# Patient Record
Sex: Female | Born: 1950
Health system: Southern US, Community
[De-identification: ages and names within clinical notes are randomized; demographics above are authoritative.]

## PROBLEM LIST (undated history)

## (undated) DIAGNOSIS — I82401 Acute embolism and thrombosis of unspecified deep veins of right lower extremity: Secondary | ICD-10-CM

## (undated) DIAGNOSIS — R0602 Shortness of breath: Secondary | ICD-10-CM

## (undated) DIAGNOSIS — S72009A Fracture of unspecified part of neck of unspecified femur, initial encounter for closed fracture: Secondary | ICD-10-CM

## (undated) DIAGNOSIS — I509 Heart failure, unspecified: Secondary | ICD-10-CM

## (undated) DIAGNOSIS — E7800 Pure hypercholesterolemia, unspecified: Secondary | ICD-10-CM

## (undated) DIAGNOSIS — I10 Essential (primary) hypertension: Secondary | ICD-10-CM

## (undated) DIAGNOSIS — A419 Sepsis, unspecified organism: Secondary | ICD-10-CM

## (undated) DIAGNOSIS — D649 Anemia, unspecified: Secondary | ICD-10-CM

## (undated) DIAGNOSIS — I639 Cerebral infarction, unspecified: Secondary | ICD-10-CM

## (undated) DIAGNOSIS — IMO0001 Reserved for inherently not codable concepts without codable children: Secondary | ICD-10-CM

## (undated) DIAGNOSIS — Z5189 Encounter for other specified aftercare: Secondary | ICD-10-CM

## (undated) DIAGNOSIS — R6521 Severe sepsis with septic shock: Secondary | ICD-10-CM

## (undated) DIAGNOSIS — J449 Chronic obstructive pulmonary disease, unspecified: Secondary | ICD-10-CM

## (undated) DIAGNOSIS — K819 Cholecystitis, unspecified: Secondary | ICD-10-CM

## (undated) DIAGNOSIS — N179 Acute kidney failure, unspecified: Secondary | ICD-10-CM

## (undated) DIAGNOSIS — K851 Biliary acute pancreatitis without necrosis or infection: Secondary | ICD-10-CM

## (undated) DIAGNOSIS — Z9889 Other specified postprocedural states: Secondary | ICD-10-CM

## (undated) DIAGNOSIS — C801 Malignant (primary) neoplasm, unspecified: Secondary | ICD-10-CM

## (undated) HISTORY — PX: ABDOMINAL HYSTERECTOMY: SHX81

## (undated) HISTORY — DX: Severe sepsis with septic shock: R65.21

## (undated) HISTORY — DX: Sepsis, unspecified organism: A41.9

## (undated) HISTORY — PX: HIP FRACTURE SURGERY: SHX118

## (undated) HISTORY — DX: Cerebral infarction, unspecified: I63.9

## (undated) HISTORY — DX: Cholecystitis, unspecified: K81.9

## (undated) HISTORY — DX: Acute embolism and thrombosis of unspecified deep veins of right lower extremity: I82.401

## (undated) HISTORY — PX: ERCP: SHX60

## (undated) HISTORY — DX: Acute kidney failure, unspecified: N17.9

## (undated) HISTORY — DX: Biliary acute pancreatitis without necrosis or infection: K85.10

---

## 1997-08-01 ENCOUNTER — Other Ambulatory Visit: Admission: RE | Admit: 1997-08-01 | Discharge: 1997-08-01 | Payer: Self-pay | Admitting: Family Medicine

## 1997-09-19 ENCOUNTER — Emergency Department (HOSPITAL_COMMUNITY): Admission: EM | Admit: 1997-09-19 | Discharge: 1997-09-19 | Payer: Self-pay | Admitting: Family Medicine

## 1997-11-14 ENCOUNTER — Encounter: Payer: Self-pay | Admitting: Family Medicine

## 1997-11-14 ENCOUNTER — Ambulatory Visit: Admission: RE | Admit: 1997-11-14 | Discharge: 1997-11-14 | Payer: Self-pay | Admitting: Family Medicine

## 1998-09-04 ENCOUNTER — Ambulatory Visit (HOSPITAL_COMMUNITY): Admission: RE | Admit: 1998-09-04 | Discharge: 1998-09-04 | Payer: Self-pay | Admitting: Family Medicine

## 1998-09-04 ENCOUNTER — Encounter: Payer: Self-pay | Admitting: Family Medicine

## 1998-10-23 ENCOUNTER — Encounter: Payer: Self-pay | Admitting: Family Medicine

## 1998-10-23 ENCOUNTER — Ambulatory Visit (HOSPITAL_COMMUNITY): Admission: RE | Admit: 1998-10-23 | Discharge: 1998-10-23 | Payer: Self-pay | Admitting: Family Medicine

## 1998-12-04 ENCOUNTER — Other Ambulatory Visit: Admission: RE | Admit: 1998-12-04 | Discharge: 1998-12-04 | Payer: Self-pay | Admitting: Obstetrics

## 1999-03-16 ENCOUNTER — Emergency Department (HOSPITAL_COMMUNITY): Admission: EM | Admit: 1999-03-16 | Discharge: 1999-03-16 | Payer: Self-pay

## 2000-07-05 ENCOUNTER — Emergency Department (HOSPITAL_COMMUNITY): Admission: EM | Admit: 2000-07-05 | Discharge: 2000-07-05 | Payer: Self-pay | Admitting: *Deleted

## 2000-08-14 ENCOUNTER — Encounter: Payer: Self-pay | Admitting: Family Medicine

## 2000-08-14 ENCOUNTER — Ambulatory Visit (HOSPITAL_COMMUNITY): Admission: RE | Admit: 2000-08-14 | Discharge: 2000-08-14 | Payer: Self-pay | Admitting: Family Medicine

## 2000-08-24 ENCOUNTER — Encounter: Payer: Self-pay | Admitting: Family Medicine

## 2000-08-24 ENCOUNTER — Ambulatory Visit (HOSPITAL_COMMUNITY): Admission: RE | Admit: 2000-08-24 | Discharge: 2000-08-24 | Payer: Self-pay | Admitting: Family Medicine

## 2001-03-04 ENCOUNTER — Encounter: Payer: Self-pay | Admitting: *Deleted

## 2001-03-04 ENCOUNTER — Ambulatory Visit (HOSPITAL_COMMUNITY): Admission: RE | Admit: 2001-03-04 | Discharge: 2001-03-04 | Payer: Self-pay | Admitting: *Deleted

## 2002-01-09 ENCOUNTER — Emergency Department (HOSPITAL_COMMUNITY): Admission: EM | Admit: 2002-01-09 | Discharge: 2002-01-09 | Payer: Self-pay | Admitting: Emergency Medicine

## 2004-08-13 ENCOUNTER — Emergency Department (HOSPITAL_COMMUNITY): Admission: EM | Admit: 2004-08-13 | Discharge: 2004-08-13 | Payer: Self-pay | Admitting: Emergency Medicine

## 2006-12-15 DIAGNOSIS — E7800 Pure hypercholesterolemia, unspecified: Secondary | ICD-10-CM

## 2006-12-15 DIAGNOSIS — I10 Essential (primary) hypertension: Secondary | ICD-10-CM

## 2006-12-30 ENCOUNTER — Ambulatory Visit: Payer: Self-pay | Admitting: Family Medicine

## 2006-12-30 DIAGNOSIS — J209 Acute bronchitis, unspecified: Secondary | ICD-10-CM

## 2006-12-30 LAB — CONVERTED CEMR LAB
Ketones, urine, test strip: NEGATIVE
Nitrite: NEGATIVE
Urobilinogen, UA: NEGATIVE
pH: 6

## 2006-12-31 ENCOUNTER — Ambulatory Visit: Payer: Self-pay | Admitting: *Deleted

## 2006-12-31 LAB — CONVERTED CEMR LAB
ALT: 16 units/L (ref 0–35)
AST: 24 units/L (ref 0–37)
Albumin: 4.8 g/dL (ref 3.5–5.2)
Alkaline Phosphatase: 101 units/L (ref 39–117)
BUN: 14 mg/dL (ref 6–23)
Basophils Relative: 0 % (ref 0–1)
Calcium: 9.8 mg/dL (ref 8.4–10.5)
Chloride: 104 meq/L (ref 96–112)
Cholesterol: 248 mg/dL — ABNORMAL HIGH (ref 0–200)
Creatinine, Ser: 0.83 mg/dL (ref 0.40–1.20)
HCT: 41.6 % (ref 36.0–46.0)
LDL Cholesterol: 163 mg/dL — ABNORMAL HIGH (ref 0–99)
Lymphocytes Relative: 43 % (ref 12–46)
MCHC: 32 g/dL (ref 30.0–36.0)
Monocytes Relative: 8 % (ref 3–11)
Neutro Abs: 3.6 10*3/uL (ref 1.7–7.7)
Neutrophils Relative %: 48 % (ref 43–77)
Platelets: 202 10*3/uL (ref 150–400)
RBC: 4.36 M/uL (ref 3.87–5.11)
TSH: 1.964 microintl units/mL (ref 0.350–5.50)
Total CHOL/HDL Ratio: 5.4
WBC: 7.6 10*3/uL (ref 4.0–10.5)

## 2007-01-06 ENCOUNTER — Encounter (INDEPENDENT_AMBULATORY_CARE_PROVIDER_SITE_OTHER): Payer: Self-pay | Admitting: *Deleted

## 2007-01-29 ENCOUNTER — Ambulatory Visit: Payer: Self-pay | Admitting: Family Medicine

## 2007-10-25 ENCOUNTER — Ambulatory Visit: Payer: Self-pay | Admitting: Family Medicine

## 2007-10-25 DIAGNOSIS — G608 Other hereditary and idiopathic neuropathies: Secondary | ICD-10-CM

## 2007-10-25 DIAGNOSIS — G609 Hereditary and idiopathic neuropathy, unspecified: Secondary | ICD-10-CM | POA: Insufficient documentation

## 2007-10-26 ENCOUNTER — Encounter (INDEPENDENT_AMBULATORY_CARE_PROVIDER_SITE_OTHER): Payer: Self-pay | Admitting: Family Medicine

## 2007-11-09 LAB — CONVERTED CEMR LAB
AST: 18 units/L (ref 0–37)
Albumin: 4.7 g/dL (ref 3.5–5.2)
Basophils Relative: 0 % (ref 0–1)
Chloride: 104 meq/L (ref 96–112)
Glucose, Bld: 78 mg/dL (ref 70–99)
HCT: 43.7 % (ref 36.0–46.0)
LDL Cholesterol: 195 mg/dL — ABNORMAL HIGH (ref 0–99)
Lead-Whole Blood: 7.4 ug/dL (ref <10)
Lymphocytes Relative: 43 % (ref 12–46)
MCHC: 31.4 g/dL (ref 30.0–36.0)
MCV: 96 fL (ref 78.0–100.0)
Neutrophils Relative %: 49 % (ref 43–77)
Platelets: 227 10*3/uL (ref 150–400)
Potassium: 4.2 meq/L (ref 3.5–5.3)
RBC Folate: 524 ng/mL (ref 180–600)
Sed Rate: 21 mm/hr (ref 0–22)
TSH: 1.097 microintl units/mL (ref 0.350–4.50)
Total Bilirubin: 0.4 mg/dL (ref 0.3–1.2)
Triglycerides: 190 mg/dL — ABNORMAL HIGH (ref <150)
WBC: 8.5 10*3/uL (ref 4.0–10.5)

## 2007-11-12 ENCOUNTER — Telehealth (INDEPENDENT_AMBULATORY_CARE_PROVIDER_SITE_OTHER): Payer: Self-pay | Admitting: *Deleted

## 2007-11-14 ENCOUNTER — Telehealth (INDEPENDENT_AMBULATORY_CARE_PROVIDER_SITE_OTHER): Payer: Self-pay | Admitting: Family Medicine

## 2007-12-28 ENCOUNTER — Ambulatory Visit: Payer: Self-pay | Admitting: Family Medicine

## 2010-03-17 ENCOUNTER — Encounter: Payer: Self-pay | Admitting: Family Medicine

## 2011-03-01 ENCOUNTER — Emergency Department (HOSPITAL_COMMUNITY): Payer: Medicare Other

## 2011-03-01 ENCOUNTER — Other Ambulatory Visit: Payer: Self-pay

## 2011-03-01 ENCOUNTER — Inpatient Hospital Stay (HOSPITAL_COMMUNITY)
Admission: EM | Admit: 2011-03-01 | Discharge: 2011-03-07 | DRG: 065 | Disposition: A | Discharge status: Rehabilitation Facility | Payer: Medicare Other | Attending: Internal Medicine | Admitting: Internal Medicine

## 2011-03-01 DIAGNOSIS — G609 Hereditary and idiopathic neuropathy, unspecified: Secondary | ICD-10-CM | POA: Diagnosis present

## 2011-03-01 DIAGNOSIS — E7800 Pure hypercholesterolemia, unspecified: Secondary | ICD-10-CM | POA: Insufficient documentation

## 2011-03-01 DIAGNOSIS — R270 Ataxia, unspecified: Secondary | ICD-10-CM

## 2011-03-01 DIAGNOSIS — E785 Hyperlipidemia, unspecified: Secondary | ICD-10-CM | POA: Diagnosis present

## 2011-03-01 DIAGNOSIS — I160 Hypertensive urgency: Secondary | ICD-10-CM | POA: Diagnosis present

## 2011-03-01 DIAGNOSIS — R279 Unspecified lack of coordination: Secondary | ICD-10-CM | POA: Diagnosis present

## 2011-03-01 DIAGNOSIS — F17200 Nicotine dependence, unspecified, uncomplicated: Secondary | ICD-10-CM | POA: Diagnosis present

## 2011-03-01 DIAGNOSIS — I69993 Ataxia following unspecified cerebrovascular disease: Secondary | ICD-10-CM

## 2011-03-01 DIAGNOSIS — Z720 Tobacco use: Secondary | ICD-10-CM | POA: Diagnosis present

## 2011-03-01 DIAGNOSIS — I519 Heart disease, unspecified: Secondary | ICD-10-CM | POA: Diagnosis present

## 2011-03-01 DIAGNOSIS — K219 Gastro-esophageal reflux disease without esophagitis: Secondary | ICD-10-CM | POA: Diagnosis present

## 2011-03-01 DIAGNOSIS — I69393 Ataxia following cerebral infarction: Secondary | ICD-10-CM

## 2011-03-01 DIAGNOSIS — N183 Chronic kidney disease, stage 3 unspecified: Secondary | ICD-10-CM | POA: Diagnosis present

## 2011-03-01 DIAGNOSIS — J209 Acute bronchitis, unspecified: Secondary | ICD-10-CM | POA: Diagnosis present

## 2011-03-01 DIAGNOSIS — R05 Cough: Secondary | ICD-10-CM | POA: Diagnosis present

## 2011-03-01 DIAGNOSIS — I129 Hypertensive chronic kidney disease with stage 1 through stage 4 chronic kidney disease, or unspecified chronic kidney disease: Secondary | ICD-10-CM | POA: Diagnosis present

## 2011-03-01 DIAGNOSIS — I6350 Cerebral infarction due to unspecified occlusion or stenosis of unspecified cerebral artery: Principal | ICD-10-CM | POA: Diagnosis present

## 2011-03-01 DIAGNOSIS — I639 Cerebral infarction, unspecified: Secondary | ICD-10-CM | POA: Diagnosis present

## 2011-03-01 DIAGNOSIS — I5189 Other ill-defined heart diseases: Secondary | ICD-10-CM | POA: Diagnosis present

## 2011-03-01 DIAGNOSIS — K5900 Constipation, unspecified: Secondary | ICD-10-CM | POA: Diagnosis present

## 2011-03-01 DIAGNOSIS — I10 Essential (primary) hypertension: Secondary | ICD-10-CM

## 2011-03-01 DIAGNOSIS — R059 Cough, unspecified: Secondary | ICD-10-CM | POA: Diagnosis present

## 2011-03-01 HISTORY — DX: Encounter for other specified aftercare: Z51.89

## 2011-03-01 HISTORY — DX: Reserved for inherently not codable concepts without codable children: IMO0001

## 2011-03-01 HISTORY — DX: Shortness of breath: R06.02

## 2011-03-01 HISTORY — DX: Pure hypercholesterolemia, unspecified: E78.00

## 2011-03-01 HISTORY — DX: Essential (primary) hypertension: I10

## 2011-03-01 LAB — URINALYSIS, ROUTINE W REFLEX MICROSCOPIC
Bilirubin Urine: NEGATIVE
Glucose, UA: NEGATIVE mg/dL
Ketones, ur: NEGATIVE mg/dL
Specific Gravity, Urine: 1.008 (ref 1.005–1.030)
Urobilinogen, UA: 0.2 mg/dL (ref 0.0–1.0)

## 2011-03-01 LAB — RAPID URINE DRUG SCREEN, HOSP PERFORMED
Amphetamines: NOT DETECTED
Barbiturates: NOT DETECTED
Benzodiazepines: NOT DETECTED
Cocaine: NOT DETECTED
Opiates: NOT DETECTED
Tetrahydrocannabinol: NOT DETECTED

## 2011-03-01 LAB — DIFFERENTIAL
Basophils Absolute: 0 10*3/uL (ref 0.0–0.1)
Basophils Relative: 0 % (ref 0–1)
Monocytes Relative: 9 % (ref 3–12)
Neutrophils Relative %: 53 % (ref 43–77)

## 2011-03-01 LAB — URINE MICROSCOPIC-ADD ON

## 2011-03-01 LAB — POCT I-STAT, CHEM 8
Glucose, Bld: 87 mg/dL (ref 70–99)
HCT: 46 % (ref 36.0–46.0)
Hemoglobin: 15.6 g/dL — ABNORMAL HIGH (ref 12.0–15.0)
Potassium: 4.6 mEq/L (ref 3.5–5.1)

## 2011-03-01 LAB — CBC
Hemoglobin: 14.4 g/dL (ref 12.0–15.0)
MCHC: 32.7 g/dL (ref 30.0–36.0)
MCV: 91.9 fL (ref 78.0–100.0)
RBC: 4.79 MIL/uL (ref 3.87–5.11)
RDW: 12.7 % (ref 11.5–15.5)
WBC: 10.3 10*3/uL (ref 4.0–10.5)

## 2011-03-01 MED ORDER — ONDANSETRON HCL 4 MG/2ML IJ SOLN: 4.0000 mg | Freq: Four times a day (QID) | INTRAMUSCULAR | Status: DC | PRN

## 2011-03-01 MED ORDER — ENOXAPARIN SODIUM 40 MG/0.4ML ~~LOC~~ SOLN
40.0000 mg | SUBCUTANEOUS | Status: DC
  Administered 2011-03-01 – 2011-03-06 (×6): 40 mg via SUBCUTANEOUS
  Filled 2011-03-01 (×7): qty 0.4

## 2011-03-01 MED ORDER — VITAMINS A & D EX OINT
TOPICAL_OINTMENT | CUTANEOUS | Status: AC
  Administered 2011-03-01: 23:00:00
  Filled 2011-03-01: qty 5

## 2011-03-01 MED ORDER — ENOXAPARIN SODIUM 40 MG/0.4ML ~~LOC~~ SOLN
40.0000 mg | SUBCUTANEOUS | Status: DC
  Filled 2011-03-01: qty 0.4

## 2011-03-01 MED ORDER — SODIUM CHLORIDE 0.9 % IV SOLN
Freq: Once | INTRAVENOUS | Status: AC
  Administered 2011-03-01: 22:00:00 via INTRAVENOUS

## 2011-03-01 MED ORDER — NICOTINE 21 MG/24HR TD PT24
21.0000 mg | MEDICATED_PATCH | Freq: Every day | TRANSDERMAL | Status: DC
  Filled 2011-03-01 (×7): qty 1

## 2011-03-01 MED ORDER — ASPIRIN 325 MG PO TABS
325.0000 mg | ORAL_TABLET | Freq: Every day | ORAL | Status: DC
  Administered 2011-03-02 – 2011-03-07 (×6): 325 mg via ORAL
  Filled 2011-03-01 (×6): qty 1

## 2011-03-01 MED ORDER — IPRATROPIUM BROMIDE 0.02 % IN SOLN
0.5000 mg | Freq: Four times a day (QID) | RESPIRATORY_TRACT | Status: DC
  Administered 2011-03-01: 0.5 mg via RESPIRATORY_TRACT

## 2011-03-01 MED ORDER — ACETAMINOPHEN 650 MG RE SUPP: 650.0000 mg | Freq: Four times a day (QID) | RECTAL | Status: DC | PRN

## 2011-03-01 MED ORDER — HYDRALAZINE HCL 20 MG/ML IJ SOLN
10.0000 mg | INTRAMUSCULAR | Status: DC | PRN
Start: 2011-03-01 — End: 2011-03-07
  Administered 2011-03-01: 19:00:00 via INTRAVENOUS
  Administered 2011-03-02 – 2011-03-04 (×2): 10 mg via INTRAVENOUS
  Filled 2011-03-01: qty 1
  Filled 2011-03-01: qty 0.5
  Filled 2011-03-01: qty 1
  Filled 2011-03-01: qty 0.5
  Filled 2011-03-01 (×2): qty 1

## 2011-03-01 MED ORDER — LABETALOL HCL 5 MG/ML IV SOLN
20.0000 mg | Freq: Once | INTRAVENOUS | Status: DC
  Filled 2011-03-01: qty 4

## 2011-03-01 MED ORDER — CLONIDINE HCL 0.1 MG PO TABS
0.1000 mg | ORAL_TABLET | Freq: Once | ORAL | Status: AC
  Administered 2011-03-01: 0.1 mg via ORAL
  Filled 2011-03-01: qty 1

## 2011-03-01 MED ORDER — ALBUTEROL SULFATE (5 MG/ML) 0.5% IN NEBU
2.5000 mg | INHALATION_SOLUTION | Freq: Four times a day (QID) | RESPIRATORY_TRACT | Status: DC
  Administered 2011-03-01: 2.5 mg via RESPIRATORY_TRACT

## 2011-03-01 MED ORDER — ASPIRIN 81 MG PO CHEW
324.0000 mg | CHEWABLE_TABLET | Freq: Once | ORAL | Status: AC
  Administered 2011-03-01: 324 mg via ORAL
  Filled 2011-03-01: qty 3
  Filled 2011-03-01: qty 1

## 2011-03-01 MED ORDER — ADULT MULTIVITAMIN W/MINERALS CH
1.0000 | ORAL_TABLET | Freq: Every day | ORAL | Status: DC
  Administered 2011-03-02 – 2011-03-07 (×6): 1 via ORAL
  Filled 2011-03-01 (×6): qty 1

## 2011-03-01 MED ORDER — LABETALOL HCL 5 MG/ML IV SOLN
10.0000 mg | Freq: Once | INTRAVENOUS | Status: AC
  Administered 2011-03-01: 10 mg via INTRAVENOUS
  Filled 2011-03-01 (×2): qty 4

## 2011-03-01 MED ORDER — ACETAMINOPHEN 325 MG PO TABS: 650.0000 mg | ORAL_TABLET | Freq: Four times a day (QID) | ORAL | Status: DC | PRN

## 2011-03-01 MED ORDER — TRAZODONE HCL 50 MG PO TABS
50.0000 mg | ORAL_TABLET | Freq: Every evening | ORAL | Status: DC | PRN
  Administered 2011-03-02 (×2): 50 mg via ORAL
  Filled 2011-03-01 (×2): qty 1

## 2011-03-01 MED ORDER — SENNOSIDES-DOCUSATE SODIUM 8.6-50 MG PO TABS
1.0000 | ORAL_TABLET | Freq: Every evening | ORAL | Status: DC | PRN
  Filled 2011-03-01: qty 1

## 2011-03-01 MED ORDER — IPRATROPIUM BROMIDE 0.02 % IN SOLN
RESPIRATORY_TRACT | Status: AC
  Filled 2011-03-01: qty 2.5

## 2011-03-01 MED ORDER — ASPIRIN 300 MG RE SUPP
300.0000 mg | Freq: Every day | RECTAL | Status: DC
  Filled 2011-03-01 (×6): qty 1

## 2011-03-01 MED ORDER — ALBUTEROL SULFATE (5 MG/ML) 0.5% IN NEBU
INHALATION_SOLUTION | RESPIRATORY_TRACT | Status: AC
  Filled 2011-03-01: qty 0.5

## 2011-03-01 NOTE — ED Provider Notes (Signed)
History     CSN: [id number]  Arrival date & time 03/01/11  1027   First MD Initiated Contact with Patient 03/01/11 1204      Chief Complaint  Patient presents with  . Extremity Weakness    (Consider location/radiation/quality/duration/timing/severity/associated sxs/prior treatment) Patient is a 61 y.o. female presenting with extremity weakness. The history is provided by the patient and a relative.  Extremity Weakness This is a new problem. The current episode started yesterday. The problem occurs constantly. The problem has been unchanged. Pertinent negatives include no chest pain, chills, fever or headaches. Associated symptoms comments: She states she took a nap yesterday afternoon and when she woke, she reports that she felt weak in the right leg and off balance when she walks. She denies dizziness, visual changes, nausea. Symptoms have been constant. No other extremity weakness, no numbness. She reports that she was treated previously with blood pressure medication but it was stopped by her doctor 3 years ago. She denies having had it checked since that time. . The symptoms are aggravated by nothing. She has tried nothing for the symptoms.    Past Medical History  Diagnosis Date  . Hypertension   . Hypercholesteremia     History reviewed. No pertinent past surgical history.  History reviewed. No pertinent family history.  History  Substance Use Topics  . Smoking status: Never Smoker   . Smokeless tobacco: Not on file  . Alcohol Use: No    OB History    Grav Para Term Preterm Abortions TAB SAB Ect Mult Living                  Review of Systems  Constitutional: Negative for fever and chills.  HENT: Negative.   Eyes: Negative for visual disturbance.  Respiratory: Negative.  Negative for shortness of breath.   Cardiovascular: Negative.  Negative for chest pain and leg swelling.  Gastrointestinal: Negative.   Musculoskeletal: Positive for extremity weakness.  Skin:  Negative.   Neurological: Negative for headaches.       See HPI. C/o ataxia.    Allergies  Review of patient's allergies indicates no known allergies.  Home Medications  No current outpatient prescriptions on file.  BP 212/128  Pulse 96  Temp(Src) 98.3 F (36.8 C) (Oral)  Resp 15  SpO2 97%  Physical Exam  Constitutional: She is oriented to person, place, and time. She appears well-developed and well-nourished.  HENT:  Head: Normocephalic.  Eyes: Pupils are equal, round, and reactive to light.  Neck: Normal range of motion. Neck supple.  Cardiovascular: Normal rate and regular rhythm.   Pulmonary/Chest: Effort normal and breath sounds normal.  Abdominal: Soft. Bowel sounds are normal. There is no tenderness. There is no rebound and no guarding.  Musculoskeletal: Normal range of motion.  Neurological: She is alert and oriented to person, place, and time. She has normal strength and normal reflexes. She displays normal reflexes. No cranial nerve deficit or sensory deficit. Gait abnormal. GCS eye subscore is 4. GCS verbal subscore is 5. GCS motor subscore is 6.       Patient has ataxic gait, stumbling backwards and to right. No foot drop.   Skin: Skin is warm and dry. No rash noted.  Psychiatric: She has a normal mood and affect.    ED Course  Procedures (including critical care time)   Labs Reviewed  CBC  DIFFERENTIAL  I-STAT, CHEM 8  URINALYSIS, ROUTINE W REFLEX MICROSCOPIC  TROPONIN I   No results found.  No diagnosis found.    MDM  [facility] to admit.        Rodena Medin, PA 03/02/11 1032

## 2011-03-01 NOTE — H&P (Signed)
Hospital Admission Note Date: 03/01/2011  Patient name: Katelyn [name] Medical record number: [id number] Date of birth: 04/05/1950 Age: 61 y.o. Gender: female PCP: Beverley Fiedler, MD  Chief Complaint: unsteady   History of Present Illness: This is a 61 year old woman who noticed the sudden onset right leg weakness last night while she was out on the porch smoking.  She denies any headache any paresthesias in the arm involvement any slurred speech or chest pain.  She thought it would be better this morning when she woke up, but it was not.  Her daughter and her to come to the emergency department when she found out about the symptoms this morning. The patient has had a dry cough for the last 2-3 months. Other than that she's been in her usual state of health.  Meds: Stopped blood pressure and cholesterol medication 3 years ago.  Possibly Crestor and HCTZ  Allergies: Review of patient's allergies indicates no known allergies. Past Medical History  Diagnosis Date  . Hypertension   . Hypercholesteremia    Past Surgical History  Procedure Date  . Abdominal hysterectomy    History reviewed. No pertinent family history. History   Social History  . Marital Status: Widowed    Spouse Name: N/A    Number of Children: N/A  . Years of Education: N/A   Occupational History  . Not on file.   Social History Main Topics  . Smoking status: Current Everyday Smoker    Types: Cigarettes  . Smokeless tobacco: Not on file  . Alcohol Use: No  . Drug Use: No  . Sexually Active:    Other Topics Concern  . Not on file   Social History Narrative  . No narrative on file    Review of Systems: Review of Systems  Respiratory: Positive for cough.   All other systems reviewed and are negative.     Filed Vitals:   03/01/11 1333 03/01/11 1436 03/01/11 1606 03/01/11 1607  BP: 218/116 176/104 195/106 195/106  Pulse:  76  97  Temp:      TempSrc:      Resp:  16  18  SpO2:  100%  97%     Physical Exam: Physical Exam  Constitutional: She is oriented to person, place, and time. She appears well-developed and well-nourished. She appears distressed.  HENT:  Head: Normocephalic and atraumatic.  Nose: Nose normal.  Mouth/Throat: No oropharyngeal exudate.  Eyes: Conjunctivae and EOM are normal. Pupils are equal, round, and reactive to light. Right eye exhibits no discharge. Left eye exhibits no discharge. No scleral icterus.  Neck: Normal range of motion. Neck supple. No JVD present. No tracheal deviation present. No thyromegaly present.  Cardiovascular: Normal rate, regular rhythm, normal heart sounds and intact distal pulses.  Exam reveals no gallop and no friction rub.   No murmur heard. Pulmonary/Chest: Effort normal. No stridor. No respiratory distress. She has wheezes. She has no rales. She exhibits no tenderness.  Abdominal: She exhibits no distension and no mass. There is no tenderness. There is no rebound and no guarding.  Musculoskeletal: Normal range of motion. She exhibits no edema and no tenderness.  Lymphadenopathy:    She has no cervical adenopathy.  Neurological: She is alert and oriented to person, place, and time. She displays normal reflexes. No cranial nerve deficit. She exhibits normal muscle tone. Coordination abnormal.  Skin: Skin is warm and dry. She is not diaphoretic.  Psychiatric: She has a normal mood and affect.  Lab results: Results for orders placed during the hospital encounter of 03/01/11  CBC      Component Value Range   WBC 10.3  4.0 - 10.5 (K/uL)   RBC 4.79  3.87 - 5.11 (MIL/uL)   Hemoglobin 14.4  12.0 - 15.0 (g/dL)   HCT 40.9  81.1 - 91.4 (%)   MCV 91.9  78.0 - 100.0 (fL)   MCH 30.1  26.0 - 34.0 (pg)   MCHC 32.7  30.0 - 36.0 (g/dL)   RDW 78.2  95.6 - 21.3 (%)   Platelets 176  150 - 400 (K/uL)  DIFFERENTIAL      Component Value Range   Neutrophils Relative 53  43 - 77 (%)   Neutro Abs 5.5  1.7 - 7.7 (K/uL)   Lymphocytes  Relative 37  12 - 46 (%)   Lymphs Abs 3.8  0.7 - 4.0 (K/uL)   Monocytes Relative 9  3 - 12 (%)   Monocytes Absolute 0.9  0.1 - 1.0 (K/uL)   Eosinophils Relative 1  0 - 5 (%)   Eosinophils Absolute 0.1  0.0 - 0.7 (K/uL)   Basophils Relative 0  0 - 1 (%)   Basophils Absolute 0.0  0.0 - 0.1 (K/uL)  URINALYSIS, ROUTINE W REFLEX MICROSCOPIC      Component Value Range   Color, Urine YELLOW  YELLOW    APPearance CLOUDY (*) CLEAR    Specific Gravity, Urine 1.008  1.005 - 1.030    pH 6.0  5.0 - 8.0    Glucose, UA NEGATIVE  NEGATIVE (mg/dL)   Hgb urine dipstick TRACE (*) NEGATIVE    Bilirubin Urine NEGATIVE  NEGATIVE    Ketones, ur NEGATIVE  NEGATIVE (mg/dL)   Protein, ur NEGATIVE  NEGATIVE (mg/dL)   Urobilinogen, UA 0.2  0.0 - 1.0 (mg/dL)   Nitrite NEGATIVE  NEGATIVE    Leukocytes, UA MODERATE (*) NEGATIVE   TROPONIN I      Component Value Range   Troponin I <0.30  <0.30 (ng/mL)  POCT I-STAT, CHEM 8      Component Value Range   Sodium 144  135 - 145 (mEq/L)   Potassium 4.6  3.5 - 5.1 (mEq/L)   Chloride 109  96 - 112 (mEq/L)   BUN 13  6 - 23 (mg/dL)   Creatinine, Ser 0.86  0.50 - 1.10 (mg/dL)   Glucose, Bld 87  70 - 99 (mg/dL)   Calcium, Ion 5.78  4.69 - 1.32 (mmol/L)   TCO2 26  0 - 100 (mmol/L)   Hemoglobin 15.6 (*) 12.0 - 15.0 (g/dL)   HCT 62.9  52.8 - 41.3 (%)  URINE MICROSCOPIC-ADD ON      Component Value Range   Squamous Epithelial / LPF FEW (*) RARE    WBC, UA 7-10  <3 (WBC/hpf)   RBC / HPF 3-6  <3 (RBC/hpf)   Urine-Other TRICHOMONAS PRESENT     Imaging results:  Ct Head Wo Contrast  03/01/2011  *RADIOLOGY REPORT*  Clinical Data: Right lower extremity pain/weakness  CT HEAD WITHOUT CONTRAST  Technique:  Contiguous axial images were obtained from the base of the skull through the vertex without contrast.  Comparison: None.  Findings: No evidence of parenchymal hemorrhage or extra-axial fluid collection. No mass lesion, mass effect, or midline shift.  No CT evidence of acute  infarction.  Subcortical white matter and periventricular small vessel ischemic changes.  Intracranial atherosclerosis.  Cerebral volume is age appropriate.  No ventriculomegaly.  The visualized  paranasal sinuses are essentially clear. The mastoid air cells are unopacified.  No evidence of calvarial fracture.  IMPRESSION: No evidence of acute intracranial abnormality.  Small vessel ischemic changes with intracranial atherosclerosis.  Original Report Authenticated By: Charline Bills, M.D.   Dg Chest Portable 1 View  03/01/2011  *RADIOLOGY REPORT*  Clinical Data: Hypertension  PORTABLE CHEST - 1 VIEW  Comparison: None.  Findings: Lungs are clear. No pleural effusion or pneumothorax.  Cardiomediastinal silhouette is within normal limits.  IMPRESSION: No evidence of acute cardiopulmonary disease.  Original Report Authenticated By: Charline Bills, M.D.    Assessment & Plan by Problem: #1. Ataxia.  Despite a normal head CT I am still concerned for up acute stroke. I will put her on a daily aspirin order PT OT and an MRI of her brain. Will put her on a stroke protocol. His consider neurology consultation because without stroke she has significant acute right leg weakness.  A urine drug screen and TSH have been ordered.  #2. Malignant hypertension. Because of the possibility of stroke we will not control her blood pressure aggressively. I started her on ACE inhibitor and Norvasc. And follow  #3.  Chronic nonproductive cough.  Will check BNP r/o heart failure.  CXR is normal.  Add Nebs.  Follow   Patient Active Problem List  Diagnoses  . HYPERLIPIDEMIA  . NEUROPATHY, IDIOPATHIC  . HYPERTENSION  . ACUTE BRONCHITIS  . Malignant hypertensive urgency  . CVA (cerebral infarction)     Greater than 70 minutes was spent on direct patient care and coordination of care.  Morse Brueggemann 03/01/2011, 5:28 PM

## 2011-03-01 NOTE — ED Notes (Signed)
Pt states she has not been on blood pressure medication x 2 years. States yesterday she woke up from a nap yesterday afternoon and her right foot was numb and tingly. Pt also states she has been "off balance". Reports non-productive cough x 2 months. ":

## 2011-03-01 NOTE — ED Notes (Signed)
Report given Sherrilee Gilles, RN.

## 2011-03-01 NOTE — ED Notes (Signed)
Pt in with c/o right lower extremity pain and states has been off balance denies recent injury  States has been off htn medications

## 2011-03-02 ENCOUNTER — Inpatient Hospital Stay (HOSPITAL_COMMUNITY): Payer: Medicare Other

## 2011-03-02 LAB — LIPID PANEL
Cholesterol: 259 mg/dL — ABNORMAL HIGH (ref 0–200)
HDL: 32 mg/dL — ABNORMAL LOW (ref >39)
LDL Cholesterol: 196 mg/dL — ABNORMAL HIGH (ref 0–99)
Total CHOL/HDL Ratio: 8.1 RATIO
Triglycerides: 153 mg/dL — ABNORMAL HIGH (ref <150)
VLDL: 31 mg/dL (ref 0–40)

## 2011-03-02 LAB — HEMOGLOBIN A1C
Hgb A1c MFr Bld: 5.6 % (ref <5.7)
Mean Plasma Glucose: 114 mg/dL (ref <117)

## 2011-03-02 MED ORDER — ROSUVASTATIN CALCIUM 10 MG PO TABS
10.0000 mg | ORAL_TABLET | Freq: Every day | ORAL | Status: DC
  Administered 2011-03-02 – 2011-03-05 (×4): 10 mg via ORAL
  Filled 2011-03-02 (×6): qty 1

## 2011-03-02 MED ORDER — AMLODIPINE BESYLATE 5 MG PO TABS
5.0000 mg | ORAL_TABLET | Freq: Every day | ORAL | Status: DC
  Administered 2011-03-02: 5 mg via ORAL
  Filled 2011-03-02 (×3): qty 1

## 2011-03-02 MED ORDER — ALBUTEROL SULFATE (5 MG/ML) 0.5% IN NEBU: 2.5000 mg | INHALATION_SOLUTION | RESPIRATORY_TRACT | Status: DC | PRN

## 2011-03-02 MED ORDER — LORAZEPAM 2 MG/ML IJ SOLN
2.0000 mg | Freq: Once | INTRAMUSCULAR | Status: AC
  Administered 2011-03-02: 2 mg via INTRAVENOUS
  Filled 2011-03-02: qty 1

## 2011-03-02 NOTE — Progress Notes (Signed)
Subjective: Pt states that she has little change in her gait since yesterday.  No new changes in word finding, dysphagia, or confusion.  She is awake, alert and oriented x 3.  Objective: Vital signs in last 24 hours: Temp:  [97.8 F (36.6 C)-98.5 F (36.9 C)] 97.9 F (36.6 C) (01/06 1410) Pulse Rate:  [78-105] 93  (01/06 1410) Resp:  [18] 18  (01/06 1410) BP: (134-199)/(83-124) 150/94 mmHg (01/06 1410) SpO2:  [97 %-100 %] 99 % (01/06 1410) Weight:  [50.6 kg (111 lb 8.8 oz)] 111 lb 8.8 oz (50.6 kg) (01/05 2047) Weight change:  Last BM Date: 03/01/11  Intake/Output from previous day: 01/05 0701 - 01/06 0700 In: -  Out: 550 [Urine:550] Intake/Output this shift:    General appearance: alert, cooperative, appears stated age and no distress Head: Normocephalic, without obvious abnormality, atraumatic Eyes: conjunctivae/corneas clear. PERRL, EOM's intact. Fundi benign. Throat: lips, mucosa, and tongue normal; teeth and gums normal Neck: no adenopathy, no carotid bruit, no JVD, supple, symmetrical, trachea midline and thyroid not enlarged, symmetric, no tenderness/mass/nodules Resp: clear to auscultation bilaterally Cardio: regular rate and rhythm, S1, S2 normal, no murmur, click, rub or gallop and no rub GI: soft, non-tender; bowel sounds normal; no masses,  no organomegaly Extremities: extremities normal, atraumatic, no cyanosis or edema Pulses: 2+ and symmetric Neurologic: Alert and oriented X 3, normal strength and tone. Normal symmetric reflexes. Normal coordination and gait Coordination: finger to nose reduced on the left  Lab Results:  Basename 03/01/11 1318  WBC 10.3  HGB 14.415.6*  HCT 44.046.0  PLT 176   BMET  Basename 03/01/11 1318  NA 144  K 4.6  CL 109  CO2 --  GLUCOSE 87  BUN 13  CREATININE 1.00  CALCIUM --    Studies/Results: Ct Head Wo Contrast  03/01/2011  *RADIOLOGY REPORT*  Clinical Data: Right lower extremity pain/weakness  CT HEAD WITHOUT  CONTRAST  Technique:  Contiguous axial images were obtained from the base of the skull through the vertex without contrast.  Comparison: None.  Findings: No evidence of parenchymal hemorrhage or extra-axial fluid collection. No mass lesion, mass effect, or midline shift.  No CT evidence of acute infarction.  Subcortical white matter and periventricular small vessel ischemic changes.  Intracranial atherosclerosis.  Cerebral volume is age appropriate.  No ventriculomegaly.  The visualized paranasal sinuses are essentially clear. The mastoid air cells are unopacified.  No evidence of calvarial fracture.  IMPRESSION: No evidence of acute intracranial abnormality.  Small vessel ischemic changes with intracranial atherosclerosis.  Original Report Authenticated By: Charline Bills, M.D.   Mr Brain Wo Contrast  03/02/2011  *RADIOLOGY REPORT*  Clinical Data: Rule out TIA.  Right lower extremity pain and weakness.The examination had to be discontinued prior to completion due to claustrophobia.  MRI HEAD WITHOUT CONTRAST  Technique:  Multiplanar, multiecho pulse sequences of the brain and surrounding structures were obtained according to standard protocol without intravenous contrast.  Comparison: CT head without contrast 03/01/2011.  Findings: Diffusion weighted images demonstrate to a focal area of non hemorrhagic infarction measuring 2.5 cm in the left cingulate gyrus, compatible with the patient's right lower extremity symptoms.  T2 and FLAIR changes are evident in this area as well. No other focal acute infarction is evident.  There is no evidence for hemorrhage or mass lesion.  The study is moderately degraded by patient motion.  Coronal T2-weighted images were not obtained.  Mild periventricular and subcortical T2 hyperintensities are advanced for age.  The  ventricles are normal size.  No significant extra-axial fluid collection is present.  Flow is present in the major intracranial arteries.  The globes are grossly  intact.  There is fluid in the right mastoid air cells.  No definite obstructing nasopharyngeal lesion is evident.  IMPRESSION:  1.  Acute non hemorrhagic infarct within the left cingulate gyrus, compatible with right lower extremity symptoms. 2.  Mild periventricular and subcortical white matter changes advanced for age. The finding is nonspecific but can be seen in the setting of chronic microvascular ischemia, a demyelinating process such as multiple sclerosis, vasculitis, complicated migraine headaches, or as the sequelae of a prior infectious or inflammatory process. 3.  The study is moderately degraded by patient motion and had to be discontinued secondary to patient claustrophobia.  These results were called by telephone on 03/02/2011  at  10:55 a.m. to  Dr. Darnelle Catalan, who verbally acknowledged these results.  Original Report Authenticated By: Jamesetta Orleans. MATTERN, M.D.   Dg Chest Portable 1 View  03/01/2011  *RADIOLOGY REPORT*  Clinical Data: Hypertension  PORTABLE CHEST - 1 VIEW  Comparison: None.  Findings: Lungs are clear. No pleural effusion or pneumothorax.  Cardiomediastinal silhouette is within normal limits.  IMPRESSION: No evidence of acute cardiopulmonary disease.  Original Report Authenticated By: Charline Bills, M.D.    Medications: I have reviewed the patient's current medications.  Assessment/Plan: 1. Acute CVA within the cyngulate gyrus appaarent on MRI today.  Neurological status is stable.  Pt is receiving ASA daily.  Will add statin and check echocardiogram and B/L carotid dopplers.  PT/OT eval and treat.  As pt is well outside the acute window for this CVA would treat blood pressures more aggressively.  Consult neurology in the am. 2. Hyperlipidemia - start statin 3. HTN - start amlodipine 5 mg 1 PO daily. 4.Ataxia - PT/OT eval and treat. Consult SW for discharge needs. 5. Chronic cough.  Likely secondary to GERD - start PPI.  LOS: 1 day   Ova Meegan 03/02/2011, 5:28 PM

## 2011-03-02 NOTE — Progress Notes (Signed)
Occupational Therapy Evaluation Patient Details Name: Katelyn [name] MRN: [id number] DOB: January 08, 1951 Today's Date: 03/02/2011  Problem List:  Patient Active Problem List  Diagnoses  . HYPERLIPIDEMIA  . NEUROPATHY, IDIOPATHIC  . HYPERTENSION  . ACUTE BRONCHITIS  . Malignant hypertensive urgency  . CVA (cerebral infarction)    Past Medical History:  Past Medical History  Diagnosis Date  . Hypertension   . Hypercholesteremia    Past Surgical History:  Past Surgical History  Procedure Date  . Abdominal hysterectomy     OT Assessment/Plan/Recommendation OT Assessment Clinical Impression Statement: Pt presents s/p apparent central gyrus CVA per CT scan, resulting in balance deficits in sitting and standing, decreased fine motor control and coordination, decreased integrated use of BUE and apparent cognitive deficits with judgement/insight and ability to self monitor and correct. OT Recommendation/Assessment: Patient will need skilled OT in the acute care venue OT Problem List: Decreased activity tolerance;Impaired balance (sitting and/or standing);Impaired vision/perception;Decreased coordination;Decreased cognition;Decreased safety awareness;Decreased knowledge of use of DME or AE;Decreased knowledge of precautions;Impaired UE functional use Barriers to Discharge: None OT Therapy Diagnosis : Cognitive deficits;Disturbance of vision;Ataxia;balance deficits OT Plan OT Frequency: Min 2X/week OT Treatment/Interventions: Self-care/ADL training;Therapeutic exercise;Neuromuscular education;Energy conservation;DME and/or AE instruction;Therapeutic activities;Cognitive remediation/compensation;Visual/perceptual remediation/compensation;Patient/family education;Balance training OT Recommendation Recommendations for Other Services: Rehab consult;Speech consult Follow Up Recommendations: Inpatient Rehab Equipment Recommended: Defer to next venue Individuals Consulted Consulted and Agree  with Results and Recommendations: Patient;Family member/caregiver Family Member Consulted: daughters OT Goals Acute Rehab OT Goals OT Goal Formulation: With patient Time For Goal Achievement: 2 weeks ADL Goals Pt Will Perform Eating: Independently ADL Goal: Eating - Progress: Not met Pt Will Perform Grooming: with supervision;Standing at sink ADL Goal: Grooming - Progress: Not met Pt Will Perform Upper Body Bathing: with supervision;Sitting in shower ADL Goal: Upper Body Bathing - Progress: Not met Pt Will Perform Lower Body Bathing: with supervision;Sit to stand in shower;with cueing (comment type and amount) ADL Goal: Lower Body Bathing - Progress: Not met Pt Will Perform Lower Body Dressing: with supervision;Sit to stand from chair;with cueing (comment type and amount) ADL Goal: Lower Body Dressing - Progress: Not met Pt Will Transfer to Toilet: with supervision;Grab bars;Ambulation ADL Goal: Toilet Transfer - Progress: Not met Pt Will Perform Toileting - Clothing Manipulation: with supervision ADL Goal: Toileting - Clothing Manipulation - Progress: Not met Pt Will Perform Toileting - Hygiene: Independently ADL Goal: Toileting - Hygiene - Progress: Not met  OT Evaluation Precautions/Restrictions  Precautions Precautions: Fall Restrictions Weight Bearing Restrictions: No Prior Functioning Home Living Lives With: Significant other [name]) Receives Help From: Family Type of Home: House Home Layout: One level Home Access: Stairs to enter Entrance Stairs-Rails: Left Entrance Stairs-Number of Steps: 3-5 Bathroom Shower/Tub: Forensic scientist: Standard Bathroom Accessibility: Yes How Accessible: Accessible via walker Home Adaptive Equipment: None Prior Function Level of Independence: Independent with basic ADLs;Independent with homemaking with ambulation Able to Take Stairs?: Yes Driving: No Vocation: Retired ADL ADL Eating/Feeding: Set  up Eating/Feeding Details (indicate cue type and reason): [name] states that she has been coughing when  eating and drinking, but that this has been going on for weeks. Where Assessed - Eating/Feeding: Chair Grooming: Wash/dry hands;Wash/dry face;Brushing hair Where Assessed - Grooming: Sitting, chair Upper Body Bathing: Minimal assistance Where Assessed - Upper Body Bathing: Supported Lower Body Bathing: Minimal assistance Where Assessed - Lower Body Bathing: Sit to stand from chair;Supported Location manager Dressing: Minimal assistance Where Assessed - Upper Body Dressing: Supported Lower Body Dressing: Minimal  assistance;Performed Where Assessed - Lower Body Dressing: Sit to stand from chair Toilet Transfer: Moderate assistance Toilet Transfer Method: Ambulating;Stand pivot Acupuncturist: Regular height toilet;Grab bars Toileting - Clothing Manipulation: Minimal assistance Where Assessed - Toileting Clothing Manipulation: Standing Toileting - Hygiene: Supervision/safety Where Assessed - Toileting Hygiene: Standing Tub/Shower Transfer: Not assessed ADL Comments: Pt very unsafe with ADL due to poor balance, insight into deficits and judgment and decreased coordination. Vision/Perception  Vision - History Baseline Vision: Wears glasses only for reading Patient Visual Report: Blurring of vision (Need to further assess. Difficulty with tracking and pursuit) Vision - Assessment Eye Alignment: Within Functional Limits Vision Assessment: Vision impaired - to be further tested in functional context Perception Perception: Within Functional Limits Praxis Praxis: Intact Cognition Cognition Arousal/Alertness: Lethargic Overall Cognitive Status: Impaired Attention: Impaired Current Attention Level: Sustained Orientation Level: Oriented X4 Safety/Judgement: Decreased safety judgement for tasks assessed Decreased Safety/Judgement: Impulsive;Decreased awareness of need for  assistance Awareness of Deficits: Decreased awareness of deficits Problem Solving: Requires assistance for problem solving Sensation/Coordination Sensation Light Touch: Appears Intact Stereognosis: Appears Intact Hot/Cold: Appears Intact Proprioception: Appears Intact Coordination Gross Motor Movements are Fluid and Coordinated: No Fine Motor Movements are Fluid and Coordinated: No Finger Nose Finger Test: Ataxic type movements on L Extremity Assessment RUE Assessment RUE Assessment: Exceptions to [facility] (Decreased fine motor control and coordiantion. Difficulty wi) LUE Assessment LUE Assessment: Within Functional Limits Mobility  Bed Mobility Bed Mobility: Yes Transfers Transfers: Yes Sit to Stand: 3: Mod assist Sit to Stand Details (indicate cue type and reason): Pt leaning posteriorly. Unable to stand safely independently Stand to Sit: 4: Min assist   End of Session OT - End of Session Equipment Utilized During Treatment: Gait belt Activity Tolerance: Patient tolerated treatment well Patient left: in chair;with call bell in reach;with family/visitor present (asked CNA to put in chair alarm) Nurse Communication: Other (comment) (chair alarm and poor judgement) General Behavior During Session: Lethargic Cognition: Impaired   Laquesha Holcomb,HILLARY 03/02/2011, 6:01 PM  [facility], OTR/L  [phone] 03/02/2011

## 2011-03-02 NOTE — ED Provider Notes (Signed)
Medical screening examination/treatment/procedure(s) were performed by non-physician practitioner and as supervising physician I was immediately available for consultation/collaboration.   Benny Lennert, MD 03/02/11 1535

## 2011-03-03 ENCOUNTER — Inpatient Hospital Stay (HOSPITAL_COMMUNITY): Payer: Medicare Other

## 2011-03-03 DIAGNOSIS — I6350 Cerebral infarction due to unspecified occlusion or stenosis of unspecified cerebral artery: Secondary | ICD-10-CM

## 2011-03-03 MED ORDER — AMLODIPINE BESYLATE 2.5 MG PO TABS
2.5000 mg | ORAL_TABLET | Freq: Two times a day (BID) | ORAL | Status: DC
  Administered 2011-03-03 – 2011-03-04 (×3): 2.5 mg via ORAL
  Filled 2011-03-03 (×4): qty 1

## 2011-03-03 MED ORDER — BENZONATATE 100 MG PO CAPS
200.0000 mg | ORAL_CAPSULE | Freq: Three times a day (TID) | ORAL | Status: DC | PRN
  Filled 2011-03-03: qty 2

## 2011-03-03 MED ORDER — SODIUM CHLORIDE 0.9 % IV BOLUS (SEPSIS)
250.0000 mL | Freq: Once | INTRAVENOUS | Status: AC
  Administered 2011-03-03: 250 mL via INTRAVENOUS

## 2011-03-03 MED ORDER — AZITHROMYCIN 250 MG PO TABS
250.0000 mg | ORAL_TABLET | Freq: Every day | ORAL | Status: AC
  Administered 2011-03-04 – 2011-03-07 (×4): 250 mg via ORAL
  Filled 2011-03-03 (×4): qty 1

## 2011-03-03 MED ORDER — AZITHROMYCIN 500 MG PO TABS
500.0000 mg | ORAL_TABLET | Freq: Every day | ORAL | Status: AC
  Administered 2011-03-03: 500 mg via ORAL
  Filled 2011-03-03: qty 1

## 2011-03-03 NOTE — Progress Notes (Signed)
*  PRELIMINARY RESULTS*  Bilateral carotid duplex has been performed.  Bilateral:  No evidence of hemodynamically significant internal carotid artery stenosis.   Vertebral artery flow is antegrade.  Mild increase in velocities at area of tortuosity in left distal ICA.    Vanna Scotland 03/03/2011, 9:42 AM

## 2011-03-03 NOTE — Progress Notes (Signed)
Physical Therapy Evaluation Patient Details Name: Katelyn [name] MRN: [id number] DOB: 04-14-1950 Today's Date: 03/03/2011 Time: 401-027 Charge: EVII  Problem List:  Patient Active Problem List  Diagnoses  . HYPERLIPIDEMIA  . NEUROPATHY, IDIOPATHIC  . HYPERTENSION  . ACUTE BRONCHITIS  . Malignant hypertensive urgency  . CVA (cerebral infarction)    Past Medical History:  Past Medical History  Diagnosis Date  . Hypertension   . Hypercholesteremia    Past Surgical History:  Past Surgical History  Procedure Date  . Abdominal hysterectomy     PT Assessment/Plan/Recommendation PT Assessment Clinical Impression Statement: Pt would benefit from acute PT services to improve independence with transfers and ambulation by increasing safety awareness, strengthening R LE and improving balance to prepare for d/c to next venue.  Pt presents with decreased safety awareness and increased assist for balance with mobility making her a high fall risk.  PT Recommendation/Assessment: Patient will need skilled PT in the acute care venue PT Problem List: Decreased strength;Decreased balance;Decreased mobility;Decreased safety awareness;Decreased knowledge of use of DME;Decreased cognition PT Therapy Diagnosis : Difficulty walking (R LE weakness, decreased balance) PT Plan PT Frequency: Min 4X/week PT Treatment/Interventions: DME instruction;Gait training;Functional mobility training;Therapeutic activities;Therapeutic exercise;Balance training;Patient/family education;Neuromuscular re-education;Cognitive remediation PT Recommendation Recommendations for Other Services: Rehab consult Follow Up Recommendations: Inpatient Rehab Equipment Recommended: Defer to next venue PT Goals  Acute Rehab PT Goals PT Goal Formulation: With patient Time For Goal Achievement: 2 weeks Pt will go Supine/Side to Sit: with modified independence;with HOB 0 degrees PT Goal: Supine/Side to Sit - Progress: Not met Pt  will go Sit to Stand: with modified independence PT Goal: Sit to Stand - Progress: Not met Pt will go Stand to Sit: with modified independence PT Goal: Stand to Sit - Progress: Not met Pt will Transfer Bed to Chair/Chair to Bed: with modified independence PT Transfer Goal: Bed to Chair/Chair to Bed - Progress: Not met Pt will Ambulate: 51 - 150 feet;with supervision;with rolling walker PT Goal: Ambulate - Progress: Not met Pt will Perform Home Exercise Program: with supervision, verbal cues required/provided PT Goal: Perform Home Exercise Program - Progress: Not met Additional Goals Additional Goal #1: Pt will demonstrate no LOB with functional activity in static standing with RW. PT Goal: Additional Goal #1 - Progress: Not met  PT Evaluation Precautions/Restrictions  Precautions Precautions: Fall Restrictions Weight Bearing Restrictions: No Prior Functioning  Home Living Lives With: Significant other Katelyn [name]) Receives Help From: Family Type of Home: House Home Layout: One level Home Access: Stairs to enter Entrance Stairs-Rails: Left Entrance Stairs-Number of Steps: 3-5 Bathroom Shower/Tub: Forensic scientist: Standard Bathroom Accessibility: Yes How Accessible: Accessible via walker Home Adaptive Equipment: None Prior Function Level of Independence: Independent with basic ADLs;Independent with homemaking with ambulation;Independent with gait Driving: No 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/Alertness: Child psychotherapist Level: Oriented X4 Safety/Judgement: Decreased safety judgement for tasks assessed Sensation/Coordination Sensation Light Touch: Appears Intact Extremity Assessment RLE Strength Right Hip Flexion: 3+/5 Right Hip ABduction: 4/5 Right Hip ADduction: 3+/5 Right Knee Extension: 4/5 Right Ankle Dorsiflexion: 4/5 LLE Strength LLE Overall Strength Comments: grossly WFL except hip flexion 4-/5 Mobility (including  Balance) Bed Mobility Bed Mobility: Yes Sit to Supine - Right: 4: Min assist Sit to Supine - Right Details (indicate cue type and reason): assist for R LE onto bed Transfers Transfers: Yes Sit to Stand: 4: Min assist;With upper extremity assist;From chair/3-in-1 Sit to Stand Details (indicate cue type and reason): assist to rise, verbal cues for hand placement,  posterior lean upon standing Stand to Sit: To chair/3-in-1;To bed;4: Min assist Stand to Sit Details: assist to control descent, verbal cues for hand placement Stand Pivot Transfers: 3: Mod assist Stand Pivot Transfer Details (indicate cue type and reason): mod assist for balance with stand pivot with a couple steps from recliner to bed with 2 HHA, pt attempted to perform R side step up HOB and unable to bring R LE over requiring total assist to prevent fall Ambulation/Gait Ambulation/Gait: Yes Ambulation/Gait Assistance: 4: Min assist Ambulation/Gait Assistance Details (indicate cue type and reason): assist for balance, pt with decreased ability to control advancement of R LE, leans to the right side and veers to right side, increased verbal cues to remain in middle of RW and ambulate in straight line Ambulation Distance (Feet): 40 Feet Assistive device: Rolling walker Gait Pattern: Decreased weight shift to left;Lateral trunk lean to right;Step-through pattern;Decreased step length - right  Balance Balance Assessed: Yes Static Standing Balance Static Standing - Comment/# of Minutes: Pt unable to perform SLS with RW support without max assist for balance Single Leg Stance - Right Leg: 0  Single Leg Stance - Left Leg: 0  Exercise    End of Session PT - End of Session Activity Tolerance: Patient tolerated treatment well Patient left: in bed;with call bell in reach;with bed alarm set Nurse Communication:  (increased fall risk) General Behavior During Session: Lethargic Cognition: Impaired  Katelyn [name],KATHrine E 03/03/2011, 11:40  AM Pager: [phone]

## 2011-03-03 NOTE — Consult Note (Signed)
Referring Physician: Rama     Chief Complaint: Right leg weakness  HPI: Katelyn [name] is an 61 y.o. female who reports awakening from a nap on 1/4 and noting weakness in her right lower extremity.  She noted difficulty walking as a consequence of such.  Did not present until 1/5 when her symptoms did not improve.  Initial head CT showed no acute abnormalities.  MR imaging shows a left cingulate gyrus acute infarct.    LSN: 02/28/11  tPA Given: No: Outside time window MRankin: 0  Past Medical History  Diagnosis Date  . Hypertension   . Hypercholesteremia     Past Surgical History  Procedure Date  . Abdominal hysterectomy     History reviewed. No pertinent family history. Social History:  reports that she has been smoking Cigarettes.  She does not have any smokeless tobacco history on file. She reports that she does not drink alcohol or use illicit drugs.  Allergies: No Known Allergies  Medications: (Patient on no medications at home) I have reviewed the patient's current medications. Scheduled:   . amLODipine  2.5 mg Oral BID  . aspirin  300 mg Rectal Daily   Or  . aspirin  325 mg Oral Daily  . azithromycin  500 mg Oral Daily   Followed by  . azithromycin  250 mg Oral Daily  . enoxaparin  40 mg Subcutaneous Q24H  . mulitivitamin with minerals  1 tablet Oral Daily  . nicotine  21 mg Transdermal Daily  . rosuvastatin  10 mg Oral q1800  . sodium chloride  250 mL Intravenous Once  . DISCONTD: amLODipine  5 mg Oral Daily  . DISCONTD: labetalol  20 mg Intravenous Once    ROS: History obtained from the patient  General ROS: negative for - chills, fatigue, fever, night sweats, weight gain or weight loss Psychological ROS: negative for - behavioral disorder, hallucinations, memory difficulties, mood swings or suicidal ideation Ophthalmic ROS: negative for - blurry vision, double vision, eye pain or loss of vision ENT ROS: negative for - epistaxis, nasal discharge, oral  lesions, sore throat, tinnitus or vertigo Allergy and Immunology ROS: negative for - hives or itchy/watery eyes Hematological and Lymphatic ROS: negative for - bleeding problems, bruising or swollen lymph nodes Endocrine ROS: negative for - galactorrhea, hair pattern changes, polydipsia/polyuria or temperature intolerance Respiratory ROS: negative for - cough, hemoptysis, shortness of breath or wheezing Cardiovascular ROS: negative for - chest pain, dyspnea on exertion, edema or irregular heartbeat Gastrointestinal ROS: negative for - abdominal pain, diarrhea, hematemesis, nausea/vomiting or stool incontinence Genito-Urinary ROS: negative for - dysuria, hematuria, incontinence or urinary frequency/urgency Musculoskeletal ROS: negative for - joint swelling or muscular weakness Neurological ROS: as noted in HPI Dermatological ROS: negative for rash and skin lesion changes  Physical Examination: Blood pressure 107/73, pulse 97, temperature 98.3 F (36.8 C), temperature source Oral, resp. rate 18, height 4\' 3"  (1.295 m), weight 50.2 kg (110 lb 10.7 oz), SpO2 96.00%.  Neurologic Examination: Mental Status: Alert, oriented, thought content appropriate.  Speech fluent without evidence of aphasia.  Able to follow 3 step commands without difficulty. Cranial Nerves: II: visual fields grossly normal, pupils equal, round, reactive to light and accommodation III,IV, VI: ptosis not present, extra-ocular motions intact bilaterally V,VII: smile symmetric, facial light touch sensation normal bilaterally VIII: hearing normal bilaterally IX,X: gag reflex present XI: trapezius strength/neck flexion strength normal bilaterally XII: tongue strength normal  Motor: Right : Upper extremity   5-/5    Left:  Upper extremity   5/5  Lower extremity   3+/5    Lower extremity   5/5 Tone and bulk:normal tone throughout; no atrophy noted Sensory: Pinprick and light touch decreased in the right lower extremity Deep  Tendon Reflexes: 2+ and symmetric with absent AJ's bilaterally Plantars: Right: mute   Left: mute Cerebellar: normal finger-to-nose and normal heel-to-shin test on the left.  Unable to perform heel-to-shin on the right   Results for orders placed during the hospital encounter of 03/01/11 (from the past 48 hour(s))  URINE RAPID DRUG SCREEN (HOSP PERFORMED)     Status: Normal   Collection Time   03/01/11  5:23 PM      Component Value Range Comment   Opiates NONE DETECTED  NONE DETECTED     Cocaine NONE DETECTED  NONE DETECTED     Benzodiazepines NONE DETECTED  NONE DETECTED     Amphetamines NONE DETECTED  NONE DETECTED     Tetrahydrocannabinol NONE DETECTED  NONE DETECTED     Barbiturates NONE DETECTED  NONE DETECTED    HEMOGLOBIN A1C     Status: Normal   Collection Time   03/02/11  6:02 AM      Component Value Range Comment   Hemoglobin A1C 5.6  <5.7 (%)    Mean Plasma Glucose 114  <117 (mg/dL)   LIPID PANEL     Status: Abnormal   Collection Time   03/02/11  6:02 AM      Component Value Range Comment   Cholesterol 259 (*) 0 - 200 (mg/dL)    Triglycerides 098 (*) <150 (mg/dL)    HDL 32 (*) >11 (mg/dL)    Total CHOL/HDL Ratio 8.1      VLDL 31  0 - 40 (mg/dL)    LDL Cholesterol 914 (*) 0 - 99 (mg/dL)   GLUCOSE, CAPILLARY     Status: Abnormal   Collection Time   03/02/11  7:47 AM      Component Value Range Comment   Glucose-Capillary 100 (*) 70 - 99 (mg/dL)    Mr Brain Wo Contrast  03/02/2011  *RADIOLOGY REPORT*  Clinical Data: Rule out TIA.  Right lower extremity pain and weakness.The examination had to be discontinued prior to completion due to claustrophobia.  MRI HEAD WITHOUT CONTRAST  Technique:  Multiplanar, multiecho pulse sequences of the brain and surrounding structures were obtained according to standard protocol without intravenous contrast.  Comparison: CT head without contrast 03/01/2011.  Findings: Diffusion weighted images demonstrate to a focal area of non hemorrhagic  infarction measuring 2.5 cm in the left cingulate gyrus, compatible with the patient's right lower extremity symptoms.  T2 and FLAIR changes are evident in this area as well. No other focal acute infarction is evident.  There is no evidence for hemorrhage or mass lesion.  The study is moderately degraded by patient motion.  Coronal T2-weighted images were not obtained.  Mild periventricular and subcortical T2 hyperintensities are advanced for age.  The ventricles are normal size.  No significant extra-axial fluid collection is present.  Flow is present in the major intracranial arteries.  The globes are grossly intact.  There is fluid in the right mastoid air cells.  No definite obstructing nasopharyngeal lesion is evident.  IMPRESSION:  1.  Acute non hemorrhagic infarct within the left cingulate gyrus, compatible with right lower extremity symptoms. 2.  Mild periventricular and subcortical white matter changes advanced for age. The finding is nonspecific but can be seen in the setting of chronic  microvascular ischemia, a demyelinating process such as multiple sclerosis, vasculitis, complicated migraine headaches, or as the sequelae of a prior infectious or inflammatory process. 3.  The study is moderately degraded by patient motion and had to be discontinued secondary to patient claustrophobia.  These results were called by telephone on 03/02/2011  at  10:55 a.m. to  Dr. Darnelle Catalan, who verbally acknowledged these results.  Original Report Authenticated By: Jamesetta Orleans. MATTERN, M.D.   Dg Chest 1vsame Day  03/03/2011  *RADIOLOGY REPORT*  Clinical Data: Cough, new CVA  CHEST - 1 VIEW SAME DAY  Comparison: 03/01/2011  Findings: Cardiomediastinal silhouette is stable.  No acute infiltrate or pleural effusion.  No pulmonary edema.  Bony thorax is stable.  IMPRESSION: No active disease.  No significant change.  Original Report Authenticated By: Natasha Mead, M.D.    Assessment: 61 y.o. female presenting with right sided  weakness and an acute infarct on imaging.  Patient with multiple risk factors.  Work up in progress.  Carotid dopplers unremarkable.  Echo unable to rule out the possibility of an intracardiac thrombus  Stroke Risk Factors - hyperlipidemia, hypertension and smoking  Plan: 1. Agree with starting statin 2. PT, OT 3. TEE 4. Prophylactic therapy-Agree with aspirin 325mg  5. Telemetry monitoring while hospitalized   Thana Farr, MD [facility] [phone] 03/03/2011, 4:59 PM

## 2011-03-03 NOTE — Progress Notes (Signed)
ST Cancellation Note [phone] [id number]  SLP to see pt for cog-ling evaluation, Daughter Babette Relic and sister Eunice Blase present.  Pt did not recall results of imaging testing although Tammy reported she had already been informed.  Note from PT/OT notes, pt with cognitive deficits.  SLP to return for SlP 03/04/11, as neurology arrived to see pt.   _X_ Treatment cancelled today due to neurology arrived to see pt at 1515.    SignatureDollene Cleveland, MS [facility] SLP [phone]

## 2011-03-03 NOTE — Progress Notes (Signed)
*  PRELIMINARY RESULTS* Echocardiogram 2D Echocardiogram has been performed.  Glean Salen [facility] 03/03/2011, 11:23 AM

## 2011-03-03 NOTE — Clinical Documentation Improvement (Signed)
GENERIC DOCUMENTATION CLARIFICATION QUERY  THIS DOCUMENT IS NOT A PERMANENT PART OF THE MEDICAL RECORD  TO RESPOND TO THE THIS QUERY, FOLLOW THE INSTRUCTIONS BELOW:  1. If needed, update documentation for the patient's encounter via the notes activity.  2. Access this query again and click edit on the In [employer].  3. After updating, or not, click F2 to complete all highlighted (required) fields concerning your review. Select "additional documentation in the medical record" OR "no additional documentation provided".  4. Click Sign note button.  5. The deficiency will fall out of your In Basket *Please let us know if you are not able to complete this workflow by phone or e-mail (listed below).  Please update your documentation within the medical record to reflect your response to this query.                                                                                        03/03/11   Dear Dr. Darnelle Catalan / Associates,  In a better effort to capture your patient's severity of illness, reflect appropriate length of stay and utilization of resources, a review of the patient medical record has revealed the following indicators.    Based on your clinical judgment, please clarify and document in a progress note and/or discharge summary the clinical condition associated with the following supporting information:  In responding to this query please exercise your independent judgment.  The fact that a query is asked, does not imply that any particular answer is desired or expected.  Based on the patient's current clinical presentation please clarify:  Pt admitted with Acute CVA.  According to H/P/PN pt with ataxia. Please specify whether or not the ataxia can be further specified as one of the diagnoses listed below and document in the pn and d/c summary.  Possible Clinical Conditions?  Cerebellar Ataxia  _______Other Condition__________________ _______Cannot Clinically  Determine   Supporting Information:  Risk Factors: Acute CVA, ataxia, Malignant HTN, Hypercholesteremia  Signs & Symptoms: LE weakness, PN o 03/02/11 states: statin and check echocardiogram and B/L carotid dopplers.  PT/OT eval and treat.  As pt is well outside the acute window for this CVA would treat blood pressures more aggressively.  Consult neurology in the am.  Diagnostics: MRI head 03/02/11 IMPRESSION:  1.  Acute non hemorrhagic infarct within the left cingulate gyrus, compatible with right lower extremity symptoms.  Treatment NORMODYNE,TRANDATE CATAPRES aspirin tablet 325 mg  aspirin suppository 300  NORVASC Amlodipine    You may use possible, probable, or suspect with inpatient documentation. possible, probable, suspected diagnoses MUST be documented at the time of discharge  Reviewed: additional documentation in the medical record 03/05/2011 ljh  Thank You,  Enis Slipper  RN, BSN, CCDS Clinical Documentation Specialist [facility] Dept Pager: [phone] / E-mail: [email or web address]   Health Information Management [facility]

## 2011-03-03 NOTE — Progress Notes (Signed)
Subjective: Pt had a very difficult night.  She had fitful sleep which was interrupted by blood pressure checks in the early morning hours as her blood pressure dropped fairly low after it had been high all day.  She states that she is trying to stay positive.  Objective: Vital signs in last 24 hours: Temp:  [97.9 F (36.6 C)-98.4 F (36.9 C)] 98.2 F (36.8 C) (01/07 0600) Pulse Rate:  [91-117] 93  (01/07 0600) Resp:  [18] 18  (01/07 0600) BP: (80-183)/(62-105) 141/83 mmHg (01/07 0600) SpO2:  [95 %-100 %] 99 % (01/07 0600) Weight:  [50.2 kg (110 lb 10.7 oz)] 110 lb 10.7 oz (50.2 kg) (01/07 0600) Weight change: -0.4 kg (-14.1 oz) Last BM Date: 03/01/11  Intake/Output from previous day: 01/06 0701 - 01/07 0700 In: 240 [P.O.:240] Out: -  Intake/Output this shift:    General appearance: alert, cooperative, appears stated age and fatigued Head: Normocephalic, without obvious abnormality, atraumatic Eyes: conjunctivae/corneas clear. PERRL, EOM's intact. Fundi benign. Throat: lips, mucosa, and tongue normal; teeth and gums normal Resp: clear to auscultation bilaterally Cardio: no rub GI: soft, non-tender; bowel sounds normal; no masses,  no organomegaly Extremities: extremities normal, atraumatic, no cyanosis or edema Skin: Skin color, texture, turgor normal. No rashes or lesions Neurologic: Grossly normal  Lab Results:  Basename 03/01/11 1318  WBC 10.3  HGB 14.415.6*  HCT 44.046.0  PLT 176   BMET  Basename 03/01/11 1318  NA 144  K 4.6  CL 109  CO2 --  GLUCOSE 87  BUN 13  CREATININE 1.00  CALCIUM --    Studies/Results: Ct Head Wo Contrast  03/01/2011  *RADIOLOGY REPORT*  Clinical Data: Right lower extremity pain/weakness  CT HEAD WITHOUT CONTRAST  Technique:  Contiguous axial images were obtained from the base of the skull through the vertex without contrast.  Comparison: None.  Findings: No evidence of parenchymal hemorrhage or extra-axial fluid collection. No  mass lesion, mass effect, or midline shift.  No CT evidence of acute infarction.  Subcortical white matter and periventricular small vessel ischemic changes.  Intracranial atherosclerosis.  Cerebral volume is age appropriate.  No ventriculomegaly.  The visualized paranasal sinuses are essentially clear. The mastoid air cells are unopacified.  No evidence of calvarial fracture.  IMPRESSION: No evidence of acute intracranial abnormality.  Small vessel ischemic changes with intracranial atherosclerosis.  Original Report Authenticated By: Charline Bills, M.D.   Mr Brain Wo Contrast  03/02/2011  *RADIOLOGY REPORT*  Clinical Data: Rule out TIA.  Right lower extremity pain and weakness.The examination had to be discontinued prior to completion due to claustrophobia.  MRI HEAD WITHOUT CONTRAST  Technique:  Multiplanar, multiecho pulse sequences of the brain and surrounding structures were obtained according to standard protocol without intravenous contrast.  Comparison: CT head without contrast 03/01/2011.  Findings: Diffusion weighted images demonstrate to a focal area of non hemorrhagic infarction measuring 2.5 cm in the left cingulate gyrus, compatible with the patient's right lower extremity symptoms.  T2 and FLAIR changes are evident in this area as well. No other focal acute infarction is evident.  There is no evidence for hemorrhage or mass lesion.  The study is moderately degraded by patient motion.  Coronal T2-weighted images were not obtained.  Mild periventricular and subcortical T2 hyperintensities are advanced for age.  The ventricles are normal size.  No significant extra-axial fluid collection is present.  Flow is present in the major intracranial arteries.  The globes are grossly intact.  There is fluid  in the right mastoid air cells.  No definite obstructing nasopharyngeal lesion is evident.  IMPRESSION:  1.  Acute non hemorrhagic infarct within the left cingulate gyrus, compatible with right lower  extremity symptoms. 2.  Mild periventricular and subcortical white matter changes advanced for age. The finding is nonspecific but can be seen in the setting of chronic microvascular ischemia, a demyelinating process such as multiple sclerosis, vasculitis, complicated migraine headaches, or as the sequelae of a prior infectious or inflammatory process. 3.  The study is moderately degraded by patient motion and had to be discontinued secondary to patient claustrophobia.  These results were called by telephone on 03/02/2011  at  10:55 a.m. to  Dr. Darnelle Catalan, who verbally acknowledged these results.  Original Report Authenticated By: Jamesetta Orleans. MATTERN, M.D.   Dg Chest Portable 1 View  03/01/2011  *RADIOLOGY REPORT*  Clinical Data: Hypertension  PORTABLE CHEST - 1 VIEW  Comparison: None.  Findings: Lungs are clear. No pleural effusion or pneumothorax.  Cardiomediastinal silhouette is within normal limits.  IMPRESSION: No evidence of acute cardiopulmonary disease.  Original Report Authenticated By: Charline Bills, M.D.    Medications: I have reviewed the patient's current medications.  Assessment/Plan: 1. Acute CVA within the cyngulate gyrus appaarent on MRI today. Neurological status is stable. Pt is receiving ASA daily. Pt to have echocardiogram and B/L carotid dopplers today. PT/OT eval and treat today. Neurology has been consulted and I have spoken to Dr. Thad Ranger.   2. Hyperlipidemia - start statin  3. HTN - Pt had low blood pressure in the early morning hours this am.  Will change to 2.5 mg Amlodipine bid. 4.Ataxia - PT/OT eval and treat. Consult SW for discharge needs.  5. Chronic cough. Likely secondary to GERD - start PPI and zithromax.  Check chest x-ray.   LOS: 2 days   Katelyn [name] 03/03/2011, 8:03 AM

## 2011-03-03 NOTE — Plan of Care (Signed)
Problem: Food- and Nutrition-Related Knowledge Deficit (NB-1.1) Goal: Nutrition education Formal process to instruct or train a patient/client in a skill or to impart knowledge to help patients/clients voluntarily manage or modify food choices and eating behavior to maintain or improve health.  Outcome: Progressing Pt asleep at time of visit.  Family at bedside.  RD left handout (Stroke Nutrition Therapy) with family and instructed on how to contact RD if pt with questions.  Family verbalized understanding.  Discussed with RN who will also assist pt contact RD if needed prior to d/c.

## 2011-03-04 DIAGNOSIS — K5900 Constipation, unspecified: Secondary | ICD-10-CM | POA: Diagnosis present

## 2011-03-04 DIAGNOSIS — I69393 Ataxia following cerebral infarction: Secondary | ICD-10-CM

## 2011-03-04 DIAGNOSIS — Z720 Tobacco use: Secondary | ICD-10-CM | POA: Diagnosis present

## 2011-03-04 DIAGNOSIS — I5189 Other ill-defined heart diseases: Secondary | ICD-10-CM | POA: Diagnosis present

## 2011-03-04 MED ORDER — POLYETHYLENE GLYCOL 3350 17 G PO PACK
17.0000 g | PACK | Freq: Every day | ORAL | Status: DC
  Administered 2011-03-04 – 2011-03-07 (×4): 17 g via ORAL
  Filled 2011-03-04 (×4): qty 1

## 2011-03-04 MED ORDER — AMLODIPINE BESYLATE 5 MG PO TABS
5.0000 mg | ORAL_TABLET | Freq: Two times a day (BID) | ORAL | Status: DC
  Administered 2011-03-04 – 2011-03-07 (×6): 5 mg via ORAL
  Filled 2011-03-04 (×7): qty 1

## 2011-03-04 NOTE — Progress Notes (Signed)
Speech Language/Pathology Speech Language Pathology Evaluation Patient Details Name: Katelyn [name] MRN: [id number] DOB: 1950/03/14 Today's Date: 03/04/2011 9528-4132 Problem List:  Patient Active Problem List  Diagnoses  . HYPERLIPIDEMIA  . NEUROPATHY, IDIOPATHIC  . HYPERTENSION  . ACUTE BRONCHITIS  . Malignant hypertensive urgency  . CVA (cerebral infarction)   Past Medical History:  Past Medical History  Diagnosis Date  . Hypertension   . Hypercholesteremia    Past Surgical History:  Past Surgical History  Procedure Date  . Abdominal hysterectomy     SLP Assessment/Plan/Recommendation Assessment Clinical Impression Statement: Pt appears with cogling skills that are near baseline level, mod memory retreival deficits noted on exam but functionally pt recalling events of the day.  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  Pt verbalizing need to use call bell for assistance due to fall risk and describing frustration with current situation, which may be factor in safety awareness concerns.  Pt reports using money orders to pay bills and stated family can assist as needed.  As pt has 24 hour assistance at home via [name] and family, do not anticipate follow up SLP needed.  Please reorder if notice functional cogling deficits.  Pt and family educated to tasks to maximize attention abilities.  Thanks.  SLP Recommendation/Assessment: Patient does not need any further Speech Lanaguage Pathology Services No Skilled Speech Therapy: Patient will have necessary level of assist by caregiver at discharge SLP Recommendations Follow up Recommendations: None Equipment Recommended: Defer to next venue Individuals Consulted Consulted and Agree with Results and Recommendations: Patient;Family member/caregiver Family Member Consulted : Tammy, daughter and Veva Holes, daughter  SLP Evaluation Prior Functioning Type of Home: House Lives With:  Significant other [name]) Receives Help From:  (family and Scientist, physiological) Vocation:  (Former custodian and Engineer, petroleum)  Cognition Overall Cognitive Status: Other (comment) (appears functional for pt support level at home) Arousal/Alertness: Awake/alert Orientation Level: Oriented X4 Attention: Focused;Selective Focused Attention: Appears intact Selective Attention: Appears intact Memory: Impaired Memory Impairment: Retrieval deficit (2/4 items recalled after 10 min with cues, mod deficit) Awareness: Appears intact Problem Solving: Appears intact Executive Function:  (self monitoring and correction, ? physically impaired) Reasoning: Appears intact (expressed concerns re independence, family having to help) Behaviors: Other (comment) (expressed concern/frustration re mobility limitations) Safety/Judgment: Appears intact (verbalized concern re fall risk) Comments: Pt did recall cva diagnosis, items consumed for breakfast, and seeing SLP previous date.  Suspect cogling skills are at or near baseline and pt has ability to have 24 hours assist at home who can provide support. Suspect pt is frustrated with current situation and is able to verbalize need to use call bell for assist.  RN reports pt requested her to stay while pt attempted to use bathroom due to fall concerns, indicating improved awarness compared to OT evaluation this w/e.    Comprehension Auditory Comprehension Overall Auditory Comprehension: Appears within functional limits for tasks assessed Yes/No Questions: Not tested Commands: Within Functional Limits Complex Commands: 75-100% accurate Conversation: Complex (appears functional for environment and pt education level) Visual Recognition/Discrimination Discrimination: Within Function Limits Reading Comprehension Reading Status: Within funtional limits  Expression Expression Primary Mode of Expression: Verbal Verbal Expression Overall Verbal Expression: Appears within  functional limits for tasks assessed Initiation: No impairment Level of Generative/Spontaneous Verbalization: Conversation Repetition: No impairment Confrontation: Within functional limits (fxl for ed level,stated cactus and hammock with verbal cue) Divergent: Other (comment) (9 animals named in 60 seconds) Pragmatics: No impairment Written Expression  Dominant Hand: Right Written Expression: Within Functional Limits (baseline per pt)  Oral/Motor Oral Motor/Sensory Function Overall Oral Motor/Sensory Function: Appears within functional limits for tasks assessed Motor Speech Overall Motor Speech: Appears within functional limits for tasks assessed  Chales Abrahams 03/04/2011, 9:55 AM

## 2011-03-04 NOTE — Progress Notes (Addendum)
Physical Therapy Treatment Patient Details Name: Katelyn [name] MRN: [id number] DOB: August 27, 1950 Today's Date: 03/04/2011 Time: 6578-4696 Charge: Leonia Reeves PT Assessment/Plan  PT - Assessment/Plan Comments on Treatment Session: Pt able to ambulate but required max A to prevent falls x2.  Pt presents with decreased proprioception at R ankle 3/4 incorrect as well as absent light touch from R mid-thigh and below (pt did report intact yesterday but tested today 2* gait). PT Plan: Discharge plan remains appropriate;Frequency remains appropriate Recommendations for Other Services: Rehab consult Follow Up Recommendations: Inpatient Rehab Equipment Recommended: Defer to next venue PT Goals  Acute Rehab PT Goals PT Goal: Supine/Side to Sit - Progress: Progressing toward goal PT Goal: Sit to Stand - Progress: Progressing toward goal PT Goal: Stand to Sit - Progress: Progressing toward goal PT Transfer Goal: Bed to Chair/Chair to Bed - Progress: Progressing toward goal PT Goal: Ambulate - Progress: Progressing toward goal  PT Treatment Precautions/Restrictions  Precautions Precautions: Fall Restrictions Weight Bearing Restrictions: No Mobility (including Balance) Bed Mobility Bed Mobility: Yes Supine to Sit: 5: Supervision;HOB elevated (Comment degrees) Supine to Sit Details (indicate cue type and reason): HOB 30* Sit to Supine: 5: Supervision;HOB elevated (comment degrees) Sit to Supine - Details (indicate cue type and reason): HOB 30* Transfers Transfers: Yes Sit to Stand: 4: Min assist;From bed;From chair/3-in-1 Sit to Stand Details (indicate cue type and reason): min/guard, verbal cues for hand placement Stand to Sit: 4: Min assist;To bed;To chair/3-in-1 Stand to Sit Details: verbal cues for reaching back to assist with controlling descent Stand Pivot Transfers: 4: Min assist Stand Pivot Transfer Details (indicate cue type and reason): assist for balance, 2 HHA, recliner to  bed Ambulation/Gait Ambulation/Gait: Yes Ambulation/Gait Assistance: 2: Max assist Ambulation/Gait Assistance Details (indicate cue type and reason): max assist for LOB x2 (pt R leg buckling and then for R LE caught around outside of RW) otherwise, pt required frequent verbal cues to ambulate in middle of RW (tends to stay toward R side), pt also has trouble maintaining straight line ambulation as she veers to right Ambulation Distance (Feet): 280 Feet Assistive device: Rolling walker Gait Pattern: Decreased step length - right;Decreased hip/knee flexion - left;Lateral trunk lean to right;Step-through pattern    Nsg tech came in to get vitals after ambulation and pt BP 160/104 mmHg, so deferred any further therapy and assisted pt back to supine position. Exercise    End of Session PT - End of Session Activity Tolerance: Patient limited by fatigue Patient left: in bed;with call bell in reach;with family/visitor present General Behavior During Session: [facility] for tasks performed Cognition: [facility] for tasks performed  Shermar Friedland,KATHrine E 03/04/2011, 2:11 PM Pager: [phone]

## 2011-03-04 NOTE — Progress Notes (Addendum)
PATIENT DETAILS Name: Katelyn [name] Age: 61 y.o. Sex: female Date of Birth: 1950-12-01 Admit Date: 03/01/2011 ZOX:[id number], MD, MD Emergency contact:   CONSULTS: 1.  Dr. Thana Farr, Neurology  Interval History: Katelyn [name] is a 61 year old female who was brought to the hospital on 03/01/11 for evaluation of the sudden onset of RLE weakness.  Subsequent MRI done on 03/02/11 showed an acute non hemorrhagic infarct within the left cingulate gyrus, compatible with right lower extremity symptoms.  She was seen and evaluated by Dr. Thad Ranger of neurology on 1/713 and a full stroke up was initiated including TEE and carotid dopplers; PT/OT/ST evaluations.  Her stroke risk factors included hyperlipidemia, hypertension and smoking.   ROS: Katelyn [name] feels well.   She was up with PT today, and reports that she has increased strength in her LLE.  Mild cough.  No dyspnea.  C/O constipation.    Objective: Vital signs in last 24 hours: Temp:  [97.5 F (36.4 C)-98.9 F (37.2 C)] 97.6 F (36.4 C) (01/08 1324) Pulse Rate:  [85-98] 91  (01/08 1324) Resp:  [17-20] 20  (01/08 1324) BP: (107-163)/(73-104) 160/104 mmHg (01/08 1324) SpO2:  [96 %-98 %] 98 % (01/08 1324) Weight change:  Last BM Date: 03/01/11  Intake/Output from previous day:  Intake/Output Summary (Last 24 hours) at 03/04/11 1338 Last data filed at 03/04/11 1200  Gross per 24 hour  Intake    600 ml  Output    250 ml  Net    350 ml     Physical Exam:  Gen:  NAD Cardiovascular:  RRR, No M/R/G Respiratory: Lungs CTAB Gastrointestinal: Abdomen soft, NT/ND with normal active bowel sounds. Extremities: No C/E/C Neuro: LLE weakness.  Speech normal.     Lab Results: Basic Metabolic Panel:  Lab 03/01/11 7829  NA 144  K 4.6  CL 109  CO2 --  GLUCOSE 87  BUN 13  CREATININE 1.00  CALCIUM --  MG --  PHOS --   GFR Estimated Creatinine Clearance: 32.6 ml/min (by C-G formula based on Cr of  1).  CBC:  Lab 03/01/11 1318  WBC 10.3  NEUTROABS 5.5  HGB 14.415.6*  HCT 44.046.0  MCV 91.9  PLT 176   Cardiac Enzymes:  Lab 03/01/11 1318  CKTOTAL --  CKMB --  CKMBINDEX --  TROPONINI <0.30   CBG:  Lab 03/02/11 0747  GLUCAP 100*   Hgb A1c  Basename 03/02/11 0602  HGBA1C 5.6   Lipid Profile  Basename 03/02/11 0602  CHOL 259*  HDL 32*  LDLCALC 196*  TRIG 153*  CHOLHDL 8.1  LDLDIRECT --    Studies/Results: Ct Head Wo Contrast  03/01/2011 IMPRESSION: No evidence of acute intracranial abnormality. Small vessel ischemic changes with intracranial atherosclerosis. Original Report Authenticated By: Charline Bills, M.D.   Mr Brain Wo Contrast  03/02/2011 IMPRESSION: 1. Acute non hemorrhagic infarct within the left cingulate gyrus, compatible with right lower extremity symptoms. 2. Mild periventricular and subcortical white matter changes advanced for age. The finding is nonspecific but can be seen in the setting of chronic microvascular ischemia, a demyelinating process such as multiple sclerosis, vasculitis, complicated migraine headaches, or as the sequelae of a prior infectious or inflammatory process. 3. The study is moderately degraded by patient motion and had to be discontinued secondary to patient claustrophobia. These results were called by telephone on 03/02/2011 at 10:55 a.m. to Dr. Darnelle Catalan, who verbally acknowledged these results. Original Report Authenticated By: Jamesetta Orleans. MATTERN, M.D.   Varney Biles  Chest Portable 1 View  03/01/2011  IMPRESSION: No evidence of acute cardiopulmonary disease. Original Report Authenticated By: Charline Bills, M.D.   Dg Chest 1vsame Day  03/03/2011  IMPRESSION: No active disease.  No significant change.  Original Report Authenticated By: Natasha Mead, M.D.   03/03/11: Carotid Dopplers Bilateral carotid duplex has been performed. Bilateral: No evidence of hemodynamically significant internal carotid artery stenosis. Vertebral artery flow is  antegrade. Mild increase in velocities at area of tortuosity in left distal ICA.   03/03/11 2 D Echocardiogram Study Conclusions  - Left ventricle: The cavity size was normal. Systolicfunction was normal. The estimated ejection fraction wasin the range of 55% to 60%. Wall motion was normal; there were no regional wall motion abnormalities. There was an increased relative contribution of atrial contraction to ventricular filling. Doppler parameters are consistent with abnormal left ventricular relaxation (grade 1diastolic dysfunction). - Aortic valve: The AV is poorly visualized but appears focally calcified with mild central aortic insufficiency. Valve area: 1.69cm^2(VTI). Valve area: 1.32cm^2 (Vmax). Impressions: - Embolic source cannot be excluded. Recommendations: Consider transesophageal echocardiography if clinically indicated in order to exclude intracardiac thrombus.    Medications: Scheduled Meds:    . amLODipine  2.5 mg Oral BID  . aspirin  300 mg Rectal Daily   Or  . aspirin  325 mg Oral Daily  . azithromycin  250 mg Oral Daily  . enoxaparin  40 mg Subcutaneous Q24H  . mulitivitamin with minerals  1 tablet Oral Daily  . nicotine  21 mg Transdermal Daily  . rosuvastatin  10 mg Oral q1800   Continuous Infusions:  PRN Meds:.acetaminophen, acetaminophen, albuterol, benzonatate, hydrALAZINE, ondansetron (ZOFRAN) IV, senna-docusate, traZODone Antibiotics: Anti-infectives     Start     Dose/Rate Route Frequency Ordered Stop   03/04/11 1000   azithromycin (ZITHROMAX) tablet 250 mg        250 mg Oral Daily 03/03/11 0813 03/08/11 0959   03/03/11 1000   azithromycin (ZITHROMAX) tablet 500 mg        500 mg Oral Daily 03/03/11 0813 03/03/11 1008           Assessment/Plan:  Principal Problem:  *CVA (cerebral infarction) The patient was admitted and a full stroke evaluation was initiated.  Carotid dopplers were done on 03/03/11 and ruled out ICA stenosis.  A 2 D Echocardiogram  was done on 03/03/11 and did not show an embolic source, although the study was sub optimal and could not entirely rule it out.  She was further risk stratified with a FLP and started on Crestor to address her hyperlipidemia.  She was put on Norvasc for BP control and we will continue to monitor and adjust her anti-hypertensive medications to achieve good BP control.  She is being treated with aspirin as well.  She has had PT/OT/ST evaluations and a consult to CIR has been placed. Active Problems:  HYPERLIPIDEMIA Started on Crestor.  Repeat FLP in 6 weeks to gauge therapy.  Acute bronchitis Was started on azithromycin 03/03/11.  Has a chronic cough.    Malignant hypertensive urgency The patient had malignant HTN on presentation.  She was placed on Norvasc upon admission.  She subsequently developed hypotension after being given PRN hydralazine, prompting Dr. Gerri Lins to change her Norvasc to BID dosing.  She is now hypertensive and we will increase her Norvasc to 5 mg BID.   Ataxia following cerebral infarction Continue PT/OT.  CIR referral for further rehab.  CKD (chronic kidney disease) stage 3, GFR 30-59  ml/min The patient's baseline creatinine is 0.81-0.83.  Her current creatinine is higher, but likely represents her new baseline.  May benefit from the addition of an ACE-I to her regimen if she needs a 2nd agent for BP control.  Diastolic dysfunction Currently clinically compensated.  Control BP.  Tobacco abuse Counseled.  Declines nicotine patch.  Constipation Start Miralax.  The patient's family was at the bedside.  I updated the patient and her family regarding her test results and the plan of care, and answered all of their questions.  LOS: 3 days   Hillery Aldo, MD Pager [phone]  03/04/2011, 1:38 PM

## 2011-03-04 NOTE — Progress Notes (Signed)
Patient BP up to 205/157 manually left arm HR in the 140's not sustained, now down to 90's. T97.4, RR 22. Pt denies headache, or any kind of pain.  On call NP Edwards notified. Order received to give Hydralazine PRN and recheck in 15 minutes and call for SBP>190. Orders carried out.

## 2011-03-05 DIAGNOSIS — I6330 Cerebral infarction due to thrombosis of unspecified cerebral artery: Secondary | ICD-10-CM

## 2011-03-05 LAB — BASIC METABOLIC PANEL
CO2: 24 mEq/L (ref 19–32)
Calcium: 10.2 mg/dL (ref 8.4–10.5)
GFR calc non Af Amer: 88 mL/min — ABNORMAL LOW (ref >90)
Potassium: 3.7 mEq/L (ref 3.5–5.1)
Sodium: 140 mEq/L (ref 135–145)

## 2011-03-05 LAB — CBC
Hemoglobin: 12.6 g/dL (ref 12.0–15.0)
MCV: 90.9 fL (ref 78.0–100.0)
Platelets: 199 10*3/uL (ref 150–400)
RBC: 4.27 MIL/uL (ref 3.87–5.11)
WBC: 9.8 10*3/uL (ref 4.0–10.5)

## 2011-03-05 NOTE — Progress Notes (Signed)
Physical Therapy Treatment Patient Details Name: Katelyn [name] MRN: [id number] DOB: [birth year] Today's Date: 03/05/2011 1410-1429 gt PT Assessment/Plan  PT - Assessment/Plan Comments on Treatment Session: Pt still with balance deficits and therefore at risk for falls will benefit from CIR to maximize independence PT Plan: Discharge plan remains appropriate;Frequency remains appropriate PT Frequency: Min 4X/week Follow Up Recommendations: Inpatient Rehab Equipment Recommended: Defer to next venue PT Goals  Acute Rehab PT Goals PT Goal: Sit to Stand - Progress: Progressing toward goal PT Goal: Stand to Sit - Progress: Progressing toward goal PT Goal: Ambulate - Progress: Progressing toward goal  PT Treatment Precautions/Restrictions  Precautions Precautions: Fall Restrictions Weight Bearing Restrictions: No Mobility (including Balance) Transfers Sit to Stand: 4: Min assist;With upper extremity assist;From bed Sit to Stand Details (indicate cue type and reason): assist for anterior wt shifta nd cues for hands Stand to Sit: 4: Min assist;To chair/3-in-1;With upper extremity assist Stand to Sit Details: assist to control descent, verbal cues for hand placement Ambulation/Gait Ambulation/Gait Assistance: 4: Min assist Ambulation/Gait Assistance Details (indicate cue type and reason): min assist for balance and RW safety/direction; decreased ability to control RLE/increased time for correct foot placement Ambulation Distance (Feet): 80 Feet Assistive device: Rolling walker Gait Pattern: Decreased step length - right;Decreased hip/knee flexion - right    Exercise    End of Session PT - End of Session Activity Tolerance: Patient limited by fatigue Patient left: in chair;with call bell in reach;with family/visitor present General Behavior During Session: [facility] for tasks performed Cognition: [facility] for tasks performed  [facility] 03/05/2011, 3:35 PM

## 2011-03-05 NOTE — Progress Notes (Signed)
Rehab admissions - Evaluated for possible admission.  Spoke with patient and daughter at bedside.  They are agreeable to inpatient rehab.  I have faxed information to insurance carrier.  Once all tests and workup is complete, anticipate admit to inpatient rehab.  Please call me for questions.  Daughter asked about a TEE being done today or in the morning.  Pager [phone]

## 2011-03-05 NOTE — Consult Note (Signed)
Physical Medicine and Rehabilitation Consult Reason for Consult: RLE weakness and ataxia Referring Phsyician: Dr. Darnelle Catalan    HPI: Katelyn [name] is an 61 y.o. female with history of HTN, dyslipidemia, admitted on 01/05 with sudden onset of RLE weakness night prior to admission.  BP elevated at admission 218/116.  Evaluated by neurology who recommended full stroke workup and ASA for CVA prophylaxis.  MRI brain with left cingulate gyrus non hemorrhagic infarct. 2 D echo with EF 55 -60 % with no wall abnormality  UDS negative.  Carotid dopplers without ICA stenosis.  PT evaluation done 01/07 and patient note ot have decreased balance with tendency to veer to the right and difficulty advancing RLE.  Blood pressures remain labile.  TEE pending?  MD, PT recommending CIR.  Review of Systems  Constitutional: Positive for malaise/fatigue.  Eyes: Negative for double vision.  Respiratory: Negative for cough and shortness of breath.   Cardiovascular: Negative for chest pain and palpitations.  Gastrointestinal: Negative for heartburn and abdominal pain.  Genitourinary: Positive for frequency (currently).  Neurological: Positive for sensory change, focal weakness and headaches.  Psychiatric/Behavioral: Negative for depression. The patient is nervous/anxious.    Past Medical History  Diagnosis Date   Medical noncompliance ( no medications for 3 years)   . Hypertension    Left forearm fracture ( casted)   . Hypercholesteremia    Past Surgical History  Procedure Date  . Abdominal hysterectomy    Family history:  Father - DM      Sister -DM  Social History:  Lives with boyfriend. Retired from custodial work.  Independent and active till recently ( increased headaches and fatigue past couple of months) She reports that she has been smoking Cigarettes.  One PPD for past 30 years. She does not have any smokeless tobacco history on file. She reports that she does not drink alcohol or use illicit drugs.   Drinks couple of pots of coffee daily. Children in town supportive and can check in on her past discharge.  Boyfriend retired and can assist past discharge.   No Known Allergies  Prior to Admission medications   Not on File   Scheduled Medications:    . amLODipine  5 mg Oral BID  . aspirin  300 mg Rectal Daily   Or  . aspirin  325 mg Oral Daily  . azithromycin  250 mg Oral Daily  . enoxaparin  40 mg Subcutaneous Q24H  . mulitivitamin with minerals  1 tablet Oral Daily  . nicotine  21 mg Transdermal Daily  . polyethylene glycol  17 g Oral Daily  . rosuvastatin  10 mg Oral q1800  . DISCONTD: amLODipine  2.5 mg Oral BID   PRN MED's: acetaminophen, acetaminophen, albuterol, benzonatate, hydrALAZINE, ondansetron (ZOFRAN) IV, senna-docusate, traZODone  Home: Home Living Lives With: Significant other [name]) Receives Help From:  (family and [name]) Type of Home: House Home Layout: One level Home Access: Stairs to enter Entrance Stairs-Rails: Left Entrance Stairs-Number of Steps: 3-5 Bathroom Shower/Tub: Forensic scientist: Standard Bathroom Accessibility: Yes How Accessible: Accessible via walker Home Adaptive Equipment: None  Functional History: Prior Function Level of Independence: Independent with basic ADLs;Independent with homemaking with ambulation;Independent with gait Able to Take Stairs?: Yes Driving: No Vocation:  (Former Runner, broadcasting/film/video) Functional Status:  Mobility: Bed Mobility Bed Mobility: Yes Supine to Sit: 5: Supervision;HOB elevated (Comment degrees) Supine to Sit Details (indicate cue type and reason): HOB 30* Sit to Supine: 5: Supervision;HOB elevated (comment degrees)  Sit to Supine - Details (indicate cue type and reason): HOB 30* Sit to Supine - Right (DO NOT USE): 4: Min assist Sit to Supine - Right Details DO NOT USE: assist for R LE onto bed Transfers Transfers: Yes Sit to Stand: 4: Min assist;From bed;From  chair/3-in-1 Sit to Stand Details (indicate cue type and reason): min/guard, verbal cues for hand placement Stand to Sit: 4: Min assist;To bed;To chair/3-in-1 Stand to Sit Details: verbal cues for reaching back to assist with controlling descent Stand Pivot Transfers: 4: Min assist Stand Pivot Transfer Details (indicate cue type and reason): assist for balance, 2 HHA, recliner to bed Ambulation/Gait Ambulation/Gait: Yes Ambulation/Gait Assistance: 2: Max assist Ambulation/Gait Assistance Details (indicate cue type and reason): max assist for LOB x2 (pt R leg buckling and then for R LE caught around outside of RW) otherwise, pt required frequent verbal cues to ambulate in middle of RW (tends to stay toward R side), pt also has trouble maintaining straight line ambulation as she veers to right Ambulation Distance (Feet): 280 Feet Assistive device: Rolling walker Gait Pattern: Decreased step length - right;Decreased hip/knee flexion - left;Lateral trunk lean to right;Step-through pattern    ADL: ADL Eating/Feeding: Set up Eating/Feeding Details (indicate cue type and reason): [name] states that she has been coughing when  eating and drinking, but that this has been going on for weeks. Where Assessed - Eating/Feeding: Chair Grooming: Wash/dry hands;Wash/dry face;Brushing hair Where Assessed - Grooming: Sitting, chair Upper Body Bathing: Minimal assistance Where Assessed - Upper Body Bathing: Supported Lower Body Bathing: Minimal assistance Where Assessed - Lower Body Bathing: Sit to stand from chair;Supported Location manager Dressing: Minimal assistance Where Assessed - Upper Body Dressing: Supported Lower Body Dressing: Minimal assistance;Performed Where Assessed - Lower Body Dressing: Sit to stand from chair Toilet Transfer: Moderate assistance Toilet Transfer Method: Ambulating;Stand pivot Acupuncturist: Regular height toilet;Grab bars Toileting - Clothing Manipulation:  Minimal assistance Where Assessed - Toileting Clothing Manipulation: Standing Toileting - Hygiene: Supervision/safety Where Assessed - Toileting Hygiene: Standing Tub/Shower Transfer: Not assessed ADL Comments: Pt very unsafe with ADL due to poor balance, insight into deficits and judgment and decreased coordination.  Cognition: Cognition Overall Cognitive Status: Other (comment) (appears functional for pt support level at home) Arousal/Alertness: Awake/alert Orientation Level: Oriented X4 Attention: Focused;Selective Focused Attention: Appears intact Selective Attention: Appears intact Memory: Impaired Memory Impairment: Retrieval deficit (2/4 items recalled after 10 min with cues, mod deficit) Awareness: Appears intact Problem Solving: Appears intact Executive Function:  (self monitoring and correction, ? physically impaired) Reasoning: Appears intact (expressed concerns re independence, family having to help) Behaviors: Other (comment) (expressed concern/frustration re mobility limitations) Safety/Judgment: Appears intact (verbalized concern re fall risk) Comments: Pt did recall cva diagnosis, items consumed for breakfast, and seeing SLP previous date.  Suspect cogling skills are at or near baseline and pt has ability to have 24 hours assist at home who can provide support. Suspect pt is frustrated with current situation and is able to verbalize need to use call bell for assist.  RN reports pt requested her to stay while pt attempted to use bathroom due to fall concerns, indicating improved awarness compared to OT evaluation this w/e.   Cognition Arousal/Alertness: Awake/alert Overall Cognitive Status: Impaired Attention: Impaired Current Attention Level: Sustained Orientation Level: Oriented X4 Safety/Judgement: Decreased safety judgement for tasks assessed Decreased Safety/Judgement: Impulsive;Decreased awareness of need for assistance Awareness of Deficits: Decreased awareness  of deficits Problem Solving: Requires assistance for problem solving  Blood pressure 132/84, pulse 90, temperature 98.3 F (36.8 C), temperature source Oral, resp. rate 18, height 4\' 3"  (1.295 m), weight 50.2 kg (110 lb 10.7 oz), SpO2 93.00%. Physical Exam  Constitutional: She is oriented to person, place, and time. She appears well-developed and well-nourished.  HENT:  Head: Normocephalic and atraumatic.  Eyes: Pupils are equal, round, and reactive to light.  Neck: Normal range of motion. Neck supple.  Cardiovascular: Normal rate and regular rhythm.   Pulmonary/Chest: Effort normal and breath sounds normal.  Abdominal: Soft. Bowel sounds are normal.  Musculoskeletal:       Left distal forearm deformity.  Neurological: She is alert and oriented to person, place, and time.       Mild RUE with pronater drift.  RLE weakness and decreased fine motor movement/ataxia.  Speech without dysarthria. May have mild R ptosis and facial droop. Follows commands without difficulty.  Distracted by headache and noted to be easily frustrated/irritable.  UES 4/5 R and 5/5 L, LES: 3 to 4/5 R and 4+/5 L. No gross sensory deficits.  DTR's 1+  Psychiatric: Her speech is normal. Thought content normal. Her mood appears anxious. Her affect is blunt. Cognition and memory are normal. She expresses impulsivity.    Results for orders placed during the hospital encounter of 03/01/11 (from the past 24 hour(s))  BASIC METABOLIC PANEL     Status: Abnormal   Collection Time   03/05/11  4:15 AM      Component Value Range   Sodium 140  135 - 145 (mEq/L)   Potassium 3.7  3.5 - 5.1 (mEq/L)   Chloride 105  96 - 112 (mEq/L)   CO2 24  19 - 32 (mEq/L)   Glucose, Bld 93  70 - 99 (mg/dL)   BUN 19  6 - 23 (mg/dL)   Creatinine, Ser 1.19  0.50 - 1.10 (mg/dL)   Calcium 14.7  8.4 - 10.5 (mg/dL)   GFR calc non Af Amer 88 (*) >90 (mL/min)   GFR calc Af Amer >90  >90 (mL/min)  CBC     Status: Normal   Collection Time   03/05/11  4:15  AM      Component Value Range   WBC 9.8  4.0 - 10.5 (K/uL)   RBC 4.27  3.87 - 5.11 (MIL/uL)   Hemoglobin 12.6  12.0 - 15.0 (g/dL)   HCT 82.9  56.2 - 13.0 (%)   MCV 90.9  78.0 - 100.0 (fL)   MCH 29.5  26.0 - 34.0 (pg)   MCHC 32.5  30.0 - 36.0 (g/dL)   RDW 86.5  78.4 - 69.6 (%)   Platelets 199  150 - 400 (K/uL)   Dg Chest 1vsame Day  03/03/2011  *RADIOLOGY REPORT*  Clinical Data: Cough, new CVA  CHEST - 1 VIEW SAME DAY  Comparison: 03/01/2011  Findings: Cardiomediastinal silhouette is stable.  No acute infiltrate or pleural effusion.  No pulmonary edema.  Bony thorax is stable.  IMPRESSION: No active disease.  No significant change.  Original Report Authenticated By: Natasha Mead, M.D.    Assessment/Plan: Diagnosis:  left cingulate gyrus non hemorrhagic infarct. 1. Does the need for close, 24 hr/day medical supervision in concert with the patient's rehab needs make it unreasonable for this patient to be served in a less intensive setting? Yes 2. Co-Morbidities requiring supervision/potential complications: CKD, HTN, Hx of diastolic dysfxn 3. Due to bladder management, bowel management, safety, skin/wound care, disease management, medication administration, pain management and patient education,  does the patient require 24 hr/day rehab nursing? Yes 4. Does the patient require coordinated care of a physician, rehab nurse, PT (1-2 hrs/day, 5 days/week) and OT (1-2 hrs/day, 5 days/week) to address physical and functional deficits in the context of the above medical diagnosis(es)? Yes Addressing deficits in the following areas: balance, bathing, bowel/bladder control, dressing, endurance, feeding, grooming, locomotion, psychosocial adjustment, strength and swallowing 5. Can the patient actively participate in an intensive therapy program of at least 3 hrs of therapy per day at least 5 days per week? Yes 6. The potential for patient to make measurable gains while on inpatient rehab is  excellent 7. Anticipated functional outcomes upon discharge from inpatients are supervision to modified independnt PT, supervision to modified independent OT 8. Estimated rehab length of stay to reach the above functional goals is: 2 weeks 9. Does the patient have adequate social supports to accommodate these discharge functional goals? Yes 10. Anticipated D/C setting: Home 11. Anticipated post D/C treatments: HH therapy 12. Overall Rehab/Functional Prognosis: excellent  RECOMMENDATIONS: This patient's condition is appropriate for continued rehabilitative care in the following setting: CIR Patient has agreed to participate in recommended program. Yes Note that insurance prior authorization may be required for reimbursement for recommended care.  Comment: Pt was a "go-getter" per daughter.  She's upset still that she cannot do what she is accustomed to from a functional standpoint.  Sounds as if she had been declining over the last month or so prior to coming into the hospital. Family support appears good.  Rehab RN to follow up.   ZTS 03/05/2011

## 2011-03-05 NOTE — Progress Notes (Signed)
Occupational Therapy Treatment Patient Details Name: Katelyn [name] MRN: [id number] DOB: April 20, 1950 Today's Date: 03/05/2011 Time in: 11:20 am Time out: 11:55 am 2SC  OT Assessment/Plan OT Assessment/Plan Comments on Treatment Session: Pt tolerated treatment well and will benefit from continued OT services to increase her safety and independence with ADL. OT Plan: Discharge plan remains appropriate OT Frequency: Min 2X/week Follow Up Recommendations: Inpatient Rehab Equipment Recommended: Defer to next venue OT Goals ADL Goals ADL Goal: Upper Body Bathing - Progress: Met (supervision sitting at EOB.) ADL Goal: Lower Body Bathing - Progress: Progressing toward goals ADL Goal: Lower Body Dressing - Progress: Progressing toward goals  OT Treatment Precautions/Restrictions  Precautions Precautions: Fall   ADL ADL Upper Body Bathing: Performed;Chest;Right arm;Left arm;Abdomen;Supervision/safety Where Assessed - Upper Body Bathing: Sitting, bed;Unsupported Lower Body Bathing: Performed;Minimal assistance Lower Body Bathing Details (indicate cue type and reason): min assist for dynamic standing balance to wash periareas and min verbal cues for increased weight bearing through right LE Where Assessed - Lower Body Bathing: Sit to stand from bed Lower Body Dressing: Performed;Minimal assistance Lower Body Dressing Details (indicate cue type and reason): to don underwear and don/doff socks Where Assessed - Lower Body Dressing: Sit to stand from bed ADL Comments: Daughter present in room. Educated pt on trying to put more weight through R LE with standing ADL as she tends to put most of her weight through L LE. No buckling in standing of R LE with and without UE support. Pt with better insight into her deficits and judgment  as she states that she is not currently strong enough to be safe  standing in the shower.  Mobility  Transfers Sit to Stand: 4: Min assist;With upper extremity  assist;From bed Stand to Sit: 4: Min assist;To bed Exercises    End of Session OT - End of Session Equipment Utilized During Treatment: Other (comment) (RW) Activity Tolerance: Patient tolerated treatment well Patient left: in bed;with call bell in reach;with bed alarm set;with family/visitor present General Behavior During Session: [facility] for tasks performed Cognition: [facility] for tasks performed  Katelyn [name]  [phone] 03/05/2011, 12:49 PM

## 2011-03-05 NOTE — Progress Notes (Signed)
Subjective: No specific concerns except had elevated blood pressure yesterday.  Objective: Vital signs in last 24 hours: Filed Vitals:   03/05/11 0500 03/05/11 0755 03/05/11 1125 03/05/11 1300  BP: 155/88 132/84 158/95 120/80  Pulse: 90   90  Temp: 98.3 F (36.8 C)   98 F (36.7 C)  TempSrc:    Oral  Resp: 18   18  Height:      Weight:      SpO2: 93%   96%   Weight change:   Intake/Output Summary (Last 24 hours) at 03/05/11 1736 Last data filed at 03/05/11 1300  Gross per 24 hour  Intake     60 ml  Output    350 ml  Net   -290 ml    Physical Exam: General: Awake, Oriented, No acute distress. HEENT: EOMI. Neck: Supple CV: S1 and S2 Lungs: Clear to ascultation bilaterally Abdomen: Soft, Nontender, Nondistended, +bowel sounds. Ext: Good pulses. Trace edema.  Lab Results:  Basename 03/05/11 0415  NA 140  K 3.7  CL 105  CO2 24  GLUCOSE 93  BUN 19  CREATININE 0.79  CALCIUM 10.2  MG --  PHOS --   No results found for this basename: AST:2,ALT:2,ALKPHOS:2,BILITOT:2,PROT:2,ALBUMIN:2 in the last 72 hours No results found for this basename: LIPASE:2,AMYLASE:2 in the last 72 hours  Basename 03/05/11 0415  WBC 9.8  NEUTROABS --  HGB 12.6  HCT 38.8  MCV 90.9  PLT 199   No results found for this basename: CKTOTAL:3,CKMB:3,CKMBINDEX:3,TROPONINI:3 in the last 72 hours No components found with this basename: POCBNP:3 No results found for this basename: DDIMER:2 in the last 72 hours No results found for this basename: HGBA1C:2 in the last 72 hours No results found for this basename: CHOL:2,HDL:2,LDLCALC:2,TRIG:2,CHOLHDL:2,LDLDIRECT:2 in the last 72 hours No results found for this basename: TSH,T4TOTAL,FREET3,T3FREE,THYROIDAB in the last 72 hours No results found for this basename: VITAMINB12:2,FOLATE:2,FERRITIN:2,TIBC:2,IRON:2,RETICCTPCT:2 in the last 72 hours  Micro Results: No results found for this or any previous visit (from the past 240  hour(s)).  Studies/Results: No results found.  Medications: I have reviewed the patient's current medications. Scheduled Meds:   . amLODipine  5 mg Oral BID  . aspirin  300 mg Rectal Daily   Or  . aspirin  325 mg Oral Daily  . azithromycin  250 mg Oral Daily  . enoxaparin  40 mg Subcutaneous Q24H  . mulitivitamin with minerals  1 tablet Oral Daily  . nicotine  21 mg Transdermal Daily  . polyethylene glycol  17 g Oral Daily  . rosuvastatin  10 mg Oral q1800   Continuous Infusions:  PRN Meds:.acetaminophen, acetaminophen, albuterol, benzonatate, hydrALAZINE, ondansetron (ZOFRAN) IV, senna-docusate, traZODone  Assessment/Plan: *CVA (cerebral infarction)  Carotid dopplers were done on 03/03/11 and ruled out ICA stenosis. A 2 D Echocardiogram was done on 03/03/11 and did not show an embolic source, although the study was sub optimal and could not entirely rule it out.  Neurology was consult and Dr. Reynolds with neurology evaluated the patient. She was further risk stratified with a lipid panel and started on Crestor to address her hyperlipidemia. She was put on Norvasc for BP control and we will continue to monitor and adjust her anti-hypertensive medications to achieve good BP control. She is being treated with aspirin as well.  Neurology is recommending TEE, spoke to [facility] cardiology to help arrange for that tomorrow.  HYPERLIPIDEMIA  Started on Crestor. Repeat FLP in 6 weeks to gauge therapy.   Acute bronchitis    Was started on azithromycin 03/03/11. Has a chronic cough.  Defined a total of 5 days.  Malignant hypertensive urgency  The patient had malignant HTN on presentation. She was placed on Norvasc upon admission. She subsequently developed hypotension after being given PRN hydralazine, prompting Dr. Swayze to change her Norvasc to BID dosing. She is now hypertensive and we will increase her Norvasc to 5 mg BID.  Currently improved.  Ataxia following cerebral infarction  Continue PT/OT.  CIR referral for further rehab.   CKD (chronic kidney disease) stage I Stable.    Diastolic dysfunction  Currently clinically compensated. Control BP.   Tobacco abuse  Counseled. Declines nicotine patch.   Constipation  Start Miralax.  Disposition Discharge the patient either to inpatient rehabilitation or skilled nursing facility after transesophageal echocardiogram is arranged for the patient tomorrow.   LOS: 4 days  [place] Severs A, MD 03/05/2011, 5:36 PM 

## 2011-03-06 ENCOUNTER — Encounter (HOSPITAL_COMMUNITY)
Admission: EM | Disposition: A | Discharge status: Rehabilitation Facility | Payer: Self-pay | Source: Home / Self Care | Attending: Internal Medicine

## 2011-03-06 ENCOUNTER — Encounter (HOSPITAL_COMMUNITY): Payer: Self-pay | Admitting: *Deleted

## 2011-03-06 DIAGNOSIS — I6789 Other cerebrovascular disease: Secondary | ICD-10-CM

## 2011-03-06 HISTORY — PX: TEE WITHOUT CARDIOVERSION: SHX5443

## 2011-03-06 SURGERY — ECHOCARDIOGRAM, TRANSESOPHAGEAL
Anesthesia: Moderate Sedation

## 2011-03-06 MED ORDER — LIDOCAINE VISCOUS 2 % MT SOLN
OROMUCOSAL | Status: AC
  Filled 2011-03-06: qty 15

## 2011-03-06 MED ORDER — MIDAZOLAM HCL 10 MG/2ML IJ SOLN
INTRAMUSCULAR | Status: AC
  Filled 2011-03-06: qty 2

## 2011-03-06 MED ORDER — HYDRALAZINE HCL 20 MG/ML IJ SOLN: 5.0000 mg | Freq: Once | INTRAMUSCULAR | Status: DC

## 2011-03-06 MED ORDER — SODIUM CHLORIDE 0.9 % IJ SOLN: 3.0000 mL | INTRAMUSCULAR | Status: DC | PRN

## 2011-03-06 MED ORDER — MIDAZOLAM HCL 10 MG/2ML IJ SOLN
INTRAMUSCULAR | Status: DC | PRN
  Administered 2011-03-06 (×4): 2 mg via INTRAVENOUS

## 2011-03-06 MED ORDER — FENTANYL CITRATE 0.05 MG/ML IJ SOLN
INTRAMUSCULAR | Status: AC
  Filled 2011-03-06: qty 2

## 2011-03-06 MED ORDER — MIDAZOLAM HCL 10 MG/2ML IJ SOLN: 10.0000 mg | Freq: Once | INTRAMUSCULAR | Status: DC

## 2011-03-06 MED ORDER — BENZOCAINE 20 % MT SOLN: 1.0000 "application " | OROMUCOSAL | Status: DC | PRN

## 2011-03-06 MED ORDER — FENTANYL CITRATE 0.05 MG/ML IJ SOLN
INTRAMUSCULAR | Status: DC | PRN
  Administered 2011-03-06 (×3): 25 ug via INTRAVENOUS

## 2011-03-06 MED ORDER — SODIUM CHLORIDE 0.9 % IJ SOLN: 3.0000 mL | Freq: Two times a day (BID) | INTRAMUSCULAR | Status: DC

## 2011-03-06 MED ORDER — FENTANYL CITRATE 0.05 MG/ML IJ SOLN: 250.0000 ug | Freq: Once | INTRAMUSCULAR | Status: DC

## 2011-03-06 MED ORDER — SODIUM CHLORIDE 0.45 % IV SOLN: INTRAVENOUS | Status: DC

## 2011-03-06 MED ORDER — SODIUM CHLORIDE 0.9 % IV SOLN
250.0000 mL | INTRAVENOUS | Status: DC | PRN
  Administered 2011-03-06: 500 mL via INTRAVENOUS

## 2011-03-06 NOTE — Progress Notes (Signed)
Rehab admissions - I now have precert for admit to acute inpatient rehab.  We are waiting for TEE to be completed and then can admit to inpatient rehab.  If TEE done today, then can admit tomorrow.  Call me for questions.  Pager [phone]

## 2011-03-06 NOTE — H&P (View-Only) (Signed)
Subjective: No specific concerns except had elevated blood pressure yesterday.  Objective: Vital signs in last 24 hours: Filed Vitals:   03/05/11 0500 03/05/11 0755 03/05/11 1125 03/05/11 1300  BP: 155/88 132/84 158/95 120/80  Pulse: 90   90  Temp: 98.3 F (36.8 C)   98 F (36.7 C)  TempSrc:    Oral  Resp: 18   18  Height:      Weight:      SpO2: 93%   96%   Weight change:   Intake/Output Summary (Last 24 hours) at 03/05/11 1736 Last data filed at 03/05/11 1300  Gross per 24 hour  Intake     60 ml  Output    350 ml  Net   -290 ml    Physical Exam: General: Awake, Oriented, No acute distress. HEENT: EOMI. Neck: Supple CV: S1 and S2 Lungs: Clear to ascultation bilaterally Abdomen: Soft, Nontender, Nondistended, +bowel sounds. Ext: Good pulses. Trace edema.  Lab Results:  [facility] 03/05/11 0415  NA 140  K 3.7  CL 105  CO2 24  GLUCOSE 93  BUN 19  CREATININE 0.79  CALCIUM 10.2  MG --  PHOS --   No results found for this basename: AST:2,ALT:2,ALKPHOS:2,BILITOT:2,PROT:2,ALBUMIN:2 in the last 72 hours No results found for this basename: LIPASE:2,AMYLASE:2 in the last 72 hours  Basename 03/05/11 0415  WBC 9.8  NEUTROABS --  HGB 12.6  HCT 38.8  MCV 90.9  PLT 199   No results found for this basename: CKTOTAL:3,CKMB:3,CKMBINDEX:3,TROPONINI:3 in the last 72 hours No components found with this basename: POCBNP:3 No results found for this basename: DDIMER:2 in the last 72 hours No results found for this basename: HGBA1C:2 in the last 72 hours No results found for this basename: CHOL:2,HDL:2,LDLCALC:2,TRIG:2,CHOLHDL:2,LDLDIRECT:2 in the last 72 hours No results found for this basename: TSH,T4TOTAL,FREET3,T3FREE,THYROIDAB in the last 72 hours No results found for this basename: VITAMINB12:2,FOLATE:2,FERRITIN:2,TIBC:2,IRON:2,RETICCTPCT:2 in the last 72 hours  Micro Results: No results found for this or any previous visit (from the past 240  hour(s)).  Studies/Results: No results found.  Medications: I have reviewed the patient's current medications. Scheduled Meds:   . amLODipine  5 mg Oral BID  . aspirin  300 mg Rectal Daily   Or  . aspirin  325 mg Oral Daily  . azithromycin  250 mg Oral Daily  . enoxaparin  40 mg Subcutaneous Q24H  . mulitivitamin with minerals  1 tablet Oral Daily  . nicotine  21 mg Transdermal Daily  . polyethylene glycol  17 g Oral Daily  . rosuvastatin  10 mg Oral q1800   Continuous Infusions:  PRN Meds:.acetaminophen, acetaminophen, albuterol, benzonatate, hydrALAZINE, ondansetron (ZOFRAN) IV, senna-docusate, traZODone  Assessment/Plan: *CVA (cerebral infarction)  Carotid dopplers were done on 03/03/11 and ruled out ICA stenosis. A 2 D Echocardiogram was done on 03/03/11 and did not show an embolic source, although the study was sub optimal and could not entirely rule it out.  Neurology was consult and Dr. Thad Ranger with neurology evaluated the patient. She was further risk stratified with a lipid panel and started on Crestor to address her hyperlipidemia. She was put on Norvasc for BP control and we will continue to monitor and adjust her anti-hypertensive medications to achieve good BP control. She is being treated with aspirin as well.  Neurology is recommending TEE, spoke to [facility] cardiology to help arrange for that tomorrow.  HYPERLIPIDEMIA  Started on Crestor. Repeat FLP in 6 weeks to gauge therapy.   Acute bronchitis  Was started on azithromycin 03/03/11. Has a chronic cough.  Defined a total of 5 days.  Malignant hypertensive urgency  The patient had malignant HTN on presentation. She was placed on Norvasc upon admission. She subsequently developed hypotension after being given PRN hydralazine, prompting Dr. Gerri Lins to change her Norvasc to BID dosing. She is now hypertensive and we will increase her Norvasc to 5 mg BID.  Currently improved.  Ataxia following cerebral infarction  Continue PT/OT.  CIR referral for further rehab.   CKD (chronic kidney disease) stage I Stable.    Diastolic dysfunction  Currently clinically compensated. Control BP.   Tobacco abuse  Counseled. Declines nicotine patch.   Constipation  Start Miralax.  Disposition Discharge the patient either to inpatient rehabilitation or skilled nursing facility after transesophageal echocardiogram is arranged for the patient tomorrow.   LOS: 4 days  Katelyn [name] A, MD 03/05/2011, 5:36 PM

## 2011-03-06 NOTE — Progress Notes (Signed)
Subjective: Patient wondering when she can be discharged.  No other specific complaints.  Objective: Vital signs in last 24 hours: Filed Vitals:   03/06/11 1552 03/06/11 1555 03/06/11 1559 03/06/11 1600  BP: 111/74 111/74 130/48 130/48  Pulse:      Temp:      TempSrc:      Resp: 13 13 13 21   Height:      Weight:      SpO2: 100% 99% 97% 97%   Weight change:   Intake/Output Summary (Last 24 hours) at 03/06/11 1625 Last data filed at 03/05/11 2100  Gross per 24 hour  Intake    120 ml  Output    600 ml  Net   -480 ml    Physical Exam: General: Awake, Oriented, No acute distress. HEENT: EOMI. Neck: Supple CV: S1 and S2 Lungs: Clear to ascultation bilaterally Abdomen: Soft, Nontender, Nondistended, +bowel sounds. Ext: Good pulses. Trace edema.  Lab Results:  [facility] 03/05/11 0415  NA 140  K 3.7  CL 105  CO2 24  GLUCOSE 93  BUN 19  CREATININE 0.79  CALCIUM 10.2  MG --  PHOS --   No results found for this basename: AST:2,ALT:2,ALKPHOS:2,BILITOT:2,PROT:2,ALBUMIN:2 in the last 72 hours No results found for this basename: LIPASE:2,AMYLASE:2 in the last 72 hours  Basename 03/05/11 0415  WBC 9.8  NEUTROABS --  HGB 12.6  HCT 38.8  MCV 90.9  PLT 199   No results found for this basename: CKTOTAL:3,CKMB:3,CKMBINDEX:3,TROPONINI:3 in the last 72 hours No components found with this basename: POCBNP:3 No results found for this basename: DDIMER:2 in the last 72 hours No results found for this basename: HGBA1C:2 in the last 72 hours No results found for this basename: CHOL:2,HDL:2,LDLCALC:2,TRIG:2,CHOLHDL:2,LDLDIRECT:2 in the last 72 hours No results found for this basename: TSH,T4TOTAL,FREET3,T3FREE,THYROIDAB in the last 72 hours No results found for this basename: VITAMINB12:2,FOLATE:2,FERRITIN:2,TIBC:2,IRON:2,RETICCTPCT:2 in the last 72 hours  Micro Results: No results found for this or any previous visit (from the past 240 hour(s)).  Studies/Results: No results  found.  Medications: I have reviewed the patient's current medications. Scheduled Meds:    . amLODipine  5 mg Oral BID  . aspirin  300 mg Rectal Daily   Or  . aspirin  325 mg Oral Daily  . azithromycin  250 mg Oral Daily  . enoxaparin  40 mg Subcutaneous Q24H  . fentaNYL  250 mcg Intravenous Once  . hydrALAZINE  5 mg Intravenous Once  . midazolam  10 mg Intravenous Once  . mulitivitamin with minerals  1 tablet Oral Daily  . nicotine  21 mg Transdermal Daily  . polyethylene glycol  17 g Oral Daily  . rosuvastatin  10 mg Oral q1800  . sodium chloride  3 mL Intravenous Q12H   Continuous Infusions:    . sodium chloride     PRN Meds:.sodium chloride, acetaminophen, acetaminophen, albuterol, benzocaine, benzonatate, fentaNYL, hydrALAZINE, midazolam, ondansetron (ZOFRAN) IV, senna-docusate, sodium chloride, traZODone  Assessment/Plan: *CVA (cerebral infarction)  Carotid dopplers were done on 03/03/11 and ruled out ICA stenosis. A 2 D Echocardiogram was done on 03/03/11 and did not show an embolic source, although the study was sub optimal and could not entirely rule it out.  Neurology was consult and Dr. Thad Ranger with neurology evaluated the patient. She was further risk stratified with a lipid panel and started on Crestor to address her hyperlipidemia. She was put on Norvasc for BP control and we will continue to monitor and adjust her anti-hypertensive medications to  achieve good BP control. She is being treated with aspirin as well.  Patient had transesophageal echocardiogram today which showed moderate to severe LVH.  Ejection fraction 65-70%.  No wall motion abnormalities.  Trivial PFO.  Plan for discharge to inpatient rehabilitation tomorrow.  HYPERLIPIDEMIA  Started on Crestor.  Repeat fasting lipid panel as outpatient..   Acute bronchitis  Was started on azithromycin 03/03/11. Has a chronic cough.  Defined a total of 5 days.  Malignant hypertensive urgency  She was placed on Norvasc  upon admission. She subsequently developed hypotension after being given PRN hydralazine, prompting a change in her medications to Norvasc to BID dosing. She is now hypertensive and we will increase her Norvasc to 5 mg BID.  Currently improved. May need to add additional agent depending on clinical course.  Ataxia following cerebral infarction  Continue PT/OT. CIR referral for further rehab.   CKD (chronic kidney disease) stage I Stable.    Diastolic dysfunction  Currently clinically compensated. Control BP.   Tobacco abuse  Counseled. Declines nicotine patch.   Constipation  Start Miralax.  Disposition Discharge the patient either to inpatient rehabilitation tomorrow.     LOS: 5 days  Yaron Grasse A, MD 03/06/2011, 4:25 PM

## 2011-03-06 NOTE — Progress Notes (Signed)
Report called to [name] for 03/06/2011 Tee with [facility].

## 2011-03-06 NOTE — PMR Pre-admission (Signed)
PMR Admission Coordinator Pre-Admission Assessment  Patient:  Katelyn [name] is an 61 y.o., female MRN:  [id number] DOB:  [birth year] Height:  4\' 3"  (129.5 cm) Weight:  50.2 kg (110 lb 10.7 oz) Patient's cell # [phone]  Current Medical History:   Patient Admitting Diagnosis: L CVA  History of Present Illness: Admitted 1/5 with sudden onset RLE weakness night prior to admission.  BP elevation on admission.  MRI with left cingulate gyrus non hemorrhagic infarct.  TEE done 03/06/11, showed "trivial" PFO.  Patients Past Medical History:   Past Medical History  Diagnosis Date  . Hypertension   . Hypercholesteremia   . Shortness of breath   . Blood transfusion    Family Medical History:  family history is negative for Anesthesia problems, and Hypotension, and Malignant hyperthermia, and Pseudochol deficiency, . NIH Stroke scale: Total: 2  Patients Current Diet: Cardiac  Prior Rehab/Hospitalizations: No previous rehab admits  Current Medications: Current facility-administered medications:acetaminophen (TYLENOL) suppository 650 mg, 650 mg, Rectal, Q6H PRN, Buena Irish, MD;  acetaminophen (TYLENOL) tablet 650 mg, 650 mg, Oral, Q6H PRN, Buena Irish, MD;  albuterol (PROVENTIL) (5 MG/ML) 0.5% nebulizer solution 2.5 mg, 2.5 mg, Nebulization, Q4H PRN, Christina Rama;  amLODipine (NORVASC) tablet 5 mg, 5 mg, Oral, BID, Christina Rama, 5 mg at 03/07/11 0825 aspirin suppository 300 mg, 300 mg, Rectal, Daily, Buena Irish, MD;  aspirin tablet 325 mg, 325 mg, Oral, Daily, Buena Irish, MD, 325 mg at 03/07/11 0946;  azithromycin (ZITHROMAX) tablet 250 mg, 250 mg, Oral, Daily, Ava Swayze, DO, 250 mg at 03/07/11 0947;  benzonatate (TESSALON) capsule 200 mg, 200 mg, Oral, TID PRN, Ava Swayze, DO enoxaparin (LOVENOX) injection 40 mg, 40 mg, Subcutaneous, Q24H, Buena Irish, MD, 40 mg at 03/06/11 2104;  hydrALAZINE (APRESOLINE) injection 10 mg, 10 mg, Intravenous, Q4H PRN, Buena Irish, MD, 10 mg at 03/04/11 2346;  hydrALAZINE (APRESOLINE) injection 5 mg, 5 mg, Intravenous, Once, Srikar A Reddy;  mulitivitamin with minerals tablet 1 tablet, 1 tablet, Oral, Daily, Buena Irish, MD, 1 tablet at 03/07/11 1000 nicotine (NICODERM CQ - dosed in mg/24 hours) patch 21 mg, 21 mg, Transdermal, Daily, Buena Irish, MD;  ondansetron (ZOFRAN) injection 4 mg, 4 mg, Intravenous, Q6H PRN, Buena Irish, MD;  polyethylene glycol (MIRALAX / GLYCOLAX) packet 17 g, 17 g, Oral, Daily, Christina Rama, 17 g at 03/07/11 0946;  rosuvastatin (CRESTOR) tablet 10 mg, 10 mg, Oral, q1800, Ava Swayze, DO, 10 mg at 03/05/11 1700 senna-docusate (Senokot-S) tablet 1 tablet, 1 tablet, Oral, QHS PRN, Buena Irish, MD;  traZODone (DESYREL) tablet 50 mg, 50 mg, Oral, QHS PRN, Buena Irish, MD, 50 mg at 03/02/11 2358;  DISCONTD: 0.45 % sodium chloride infusion, , Intravenous, Continuous, Dolores Patty, MD;  DISCONTD: 0.9 %  sodium chloride infusion, 250 mL, Intravenous, PRN, Dolores Patty, MD, Last Rate: 20 mL/hr at 03/06/11 1438, 500 mL at 03/06/11 1438 DISCONTD: benzocaine (HURRICAINE) 20 % mouth spray 1 application, 1 application, Mouth/Throat, PRN, Dolores Patty, MD;  DISCONTD: fentaNYL (SUBLIMAZE) injection 250 mcg, 250 mcg, Intravenous, Once, Dolores Patty, MD;  DISCONTD: fentaNYL (SUBLIMAZE) injection, , , PRN, Dolores Patty, MD, 25 mcg at 03/06/11 1523;  DISCONTD: midazolam (VERSED) injection 10 mg, 10 mg, Intravenous, Once, Dolores Patty, MD DISCONTD: midazolam (VERSED) injection, , , PRN, Dolores Patty, MD, 2 mg at 03/06/11 1527;  DISCONTD: sodium chloride 0.9 % injection 3 mL, 3 mL, Intravenous, Q12H, Dolores Patty, MD;  DISCONTD: sodium chloride 0.9 %  injection 3 mL, 3 mL, Intravenous, PRN, Dolores Patty, MD  Additional Precautions/Restrictions: Precautions Precautions: Fall Restrictions Weight Bearing Restrictions: No  Therapy  Assessments Physical Therapy: Precautions Precautions: Fall Home Living Lives With: Significant other [name]) Receives Help From:  (family and [name]) Type of Home: House Home Layout: One level Home Access: Stairs to enter Entrance Stairs-Rails: Left Entrance Stairs-Number of Steps: 3-5 Bathroom Shower/Tub: Forensic scientist: Standard Bathroom Accessibility: Yes How Accessible: Accessible via walker Home Adaptive Equipment: None Prior Function Level of Independence: Independent with basic ADLs;Independent with homemaking with ambulation;Independent with gait Able to Take Stairs?: Yes Driving: No Vocation:  (Former Runner, broadcasting/film/video) Educational psychologist Movements are Fluid and Coordinated: No Fine Motor Movements are Fluid and Coordinated: No Finger Nose Finger Test: Ataxic type movements on L  Occupational Therapy: Precautions Precautions: Fall Home Living Lives With: Significant other [name]) Receives Help From:  (family and [name]) Type of Home: House Home Layout: One level Home Access: Stairs to enter Entrance Stairs-Rails: Left Entrance Stairs-Number of Steps: 3-5 Bathroom Shower/Tub: Forensic scientist: Standard Bathroom Accessibility: Yes How Accessible: Accessible via walker Home Adaptive Equipment: None Prior Function Level of Independence: Independent with basic ADLs;Independent with homemaking with ambulation;Independent with gait Able to Take Stairs?: Yes Driving: No Vocation:  (Former Runner, broadcasting/film/video) Educational psychologist Movements are Fluid and Coordinated: No Fine Motor Movements are Fluid and Coordinated: No Finger Nose Finger Test: Ataxic type movements on L Restrictions Weight Bearing Restrictions: No ADL Eating/Feeding: Set up Eating/Feeding Details (indicate cue type and reason): [name] states that she has been coughing when  eating and drinking, but that this  has been going on for weeks. Where Assessed - Eating/Feeding: Chair Grooming: Wash/dry hands;Wash/dry face;Brushing hair Where Assessed - Grooming: Sitting, chair Upper Body Bathing: Performed;Chest;Right arm;Left arm;Abdomen;Supervision/safety Where Assessed - Upper Body Bathing: Sitting, bed;Unsupported Lower Body Bathing: Performed;Minimal assistance Lower Body Bathing Details (indicate cue type and reason): min assist for dynamic standing balance to wash periareas and min verbal cues for increased weight bearing through right LE Where Assessed - Lower Body Bathing: Sit to stand from bed Upper Body Dressing: Minimal assistance Where Assessed - Upper Body Dressing: Supported Lower Body Dressing: Performed;Minimal assistance Lower Body Dressing Details (indicate cue type and reason): to don underwear and don/doff socks Where Assessed - Lower Body Dressing: Sit to stand from bed Toilet Transfer: Moderate assistance Toilet Transfer Method: Ambulating;Stand pivot Acupuncturist: Regular height toilet;Grab bars Toileting - Clothing Manipulation: Minimal assistance Where Assessed - Toileting Clothing Manipulation: Standing Toileting - Hygiene: Supervision/safety Where Assessed - Toileting Hygiene: Standing Tub/Shower Transfer: Not assessed ADL Comments: Daughter present in room. Educated pt on trying to put more weight through R LE with standing ADL as she tends to put most of her weight through L LE. No buckling in standing of R LE with and without UE support. Pt with better insight into her deficits and judgment  as she states that she is not currently strong enough to be safe  standing in the shower.   SLP Recommendations: Follow up Recommendations: None Equipment Recommended: Defer to next venue Follow up Recommendations: None  Prior Function: Level of Independence: Independent with basic ADLs;Independent with homemaking with ambulation;Independent with gait Able to Take  Stairs?: Yes Driving: No Vocation:  (Former Runner, broadcasting/film/video) ADL Eating/Feeding: Set up Eating/Feeding Details (indicate cue type and reason): [name] states that she has been coughing when  eating and drinking,  but that this has been going on for weeks. Where Assessed - Eating/Feeding: Chair Grooming: Wash/dry hands;Wash/dry face;Brushing hair Where Assessed - Grooming: Sitting, chair Upper Body Bathing: Performed;Chest;Right arm;Left arm;Abdomen;Supervision/safety Where Assessed - Upper Body Bathing: Sitting, bed;Unsupported Lower Body Bathing: Performed;Minimal assistance Lower Body Bathing Details (indicate cue type and reason): min assist for dynamic standing balance to wash periareas and min verbal cues for increased weight bearing through right LE Where Assessed - Lower Body Bathing: Sit to stand from bed Upper Body Dressing: Minimal assistance Where Assessed - Upper Body Dressing: Supported Lower Body Dressing: Performed;Minimal assistance Lower Body Dressing Details (indicate cue type and reason): to don underwear and don/doff socks Where Assessed - Lower Body Dressing: Sit to stand from bed Toilet Transfer: Moderate assistance Toilet Transfer Method: Ambulating;Stand pivot Acupuncturist: Regular height toilet;Grab bars Toileting - Clothing Manipulation: Minimal assistance Where Assessed - Toileting Clothing Manipulation: Standing Toileting - Hygiene: Supervision/safety Where Assessed - Toileting Hygiene: Standing Tub/Shower Transfer: Not assessed ADL Comments: Daughter present in room. Educated pt on trying to put more weight through R LE with standing ADL as she tends to put most of her weight through L LE. No buckling in standing of R LE with and without UE support. Pt with better insight into her deficits and judgment  as she states that she is not currently strong enough to be safe  standing in the shower.   Additional Prior Functional Levels:   Bed Mobility: I Transfers: I Mobility - Walk/Wheelchair: I Upper Body Dressing: I Lower Body Dressing: I Grooming: I Eating/Drinking: I Toilet Transfer: I Bladder Continence: WNL Bowel Management: WNL Stair Climbing: I Communication: WNL Memory: WNL Cooking/Meal Prep: I Housework: I Money Management: I Driving: No  Prior Activity Level: Community (5-7x/wk): Walked daily outside  ADLs/Mobility: ADL Eating/Feeding: Set up Eating/Feeding Details (indicate cue type and reason): [name] states that she has been coughing when  eating and drinking, but that this has been going on for weeks. Where Assessed - Eating/Feeding: Chair Grooming: Wash/dry hands;Wash/dry face;Brushing hair Where Assessed - Grooming: Sitting, chair Upper Body Bathing: Performed;Chest;Right arm;Left arm;Abdomen;Supervision/safety Where Assessed - Upper Body Bathing: Sitting, bed;Unsupported Lower Body Bathing: Performed;Minimal assistance Lower Body Bathing Details (indicate cue type and reason): min assist for dynamic standing balance to wash periareas and min verbal cues for increased weight bearing through right LE Where Assessed - Lower Body Bathing: Sit to stand from bed Upper Body Dressing: Minimal assistance Where Assessed - Upper Body Dressing: Supported Lower Body Dressing: Performed;Minimal assistance Lower Body Dressing Details (indicate cue type and reason): to don underwear and don/doff socks Where Assessed - Lower Body Dressing: Sit to stand from bed Toilet Transfer: Moderate assistance Toilet Transfer Method: Ambulating;Stand pivot Acupuncturist: Regular height toilet;Grab bars Toileting - Clothing Manipulation: Minimal assistance Where Assessed - Toileting Clothing Manipulation: Standing Toileting - Hygiene: Supervision/safety Where Assessed - Toileting Hygiene: Standing Tub/Shower Transfer: Not assessed ADL Comments: Daughter present in room. Educated pt on trying to put  more weight through R LE with standing ADL as she tends to put most of her weight through L LE. No buckling in standing of R LE with and without UE support. Pt with better insight into her deficits and judgment  as she states that she is not currently strong enough to be safe  standing in the shower.   Bed Mobility Bed Mobility: Yes Supine to Sit: 5: Supervision;HOB elevated (Comment degrees) Supine to Sit Details (indicate cue type and reason): HOB  30* Sit to Supine: 5: Supervision;HOB elevated (comment degrees) Sit to Supine - Details (indicate cue type and reason): HOB 30* Sit to Supine - Right (DO NOT USE): 4: Min assist Sit to Supine - Right Details DO NOT USE: assist for R LE onto bed Transfers Transfers: Yes Sit to Stand: 4: Min assist;With upper extremity assist;From bed Sit to Stand Details (indicate cue type and reason): assist for anterior wt shifta nd cues for hands Stand to Sit: 4: Min assist;To chair/3-in-1;With upper extremity assist Stand to Sit Details: assist to control descent, verbal cues for hand placement Stand Pivot Transfers: 4: Min assist Stand Pivot Transfer Details (indicate cue type and reason): assist for balance, 2 HHA, recliner to bed Ambulation/Gait Ambulation/Gait: Yes Ambulation/Gait Assistance: 4: Min assist Ambulation/Gait Assistance Details (indicate cue type and reason): min assist for balance and RW safety/direction; decreased ability to control RLE/increased time for correct foot placement Ambulation Distance (Feet): 80 Feet Assistive device: Rolling walker Gait Pattern: Decreased step length - right;Decreased hip/knee flexion - right Balance Balance Assessed: Yes Static Standing Balance Static Standing - Comment/# of Minutes: Pt unable to perform SLS with RW support without max assist for balance Single Leg Stance - Right Leg: 0  Single Leg Stance - Left Leg: 0   Home Assistive Devices/Equipment:  Home Assistive Devices/Equipment Home Assistive  Devices/Equipment: None  Discharge Planning:  Living Arrangements: Spouse/significant other Support Systems: Spouse/significant other Do you have any problems obtaining your medications?: No Type of Residence: Private residence Home Care Services: No Patient expects to be discharged to:: home Case Management Consult Needed: No  Previous Home Environment:  Living Arrangements: Spouse/significant other Support Systems: Spouse/significant other Do you have any problems obtaining your medications?: No Type of Residence: Private residence Home Care Services: No Patient expects to be discharged to:: home  Discharge Living Setting:  Plans for Discharge Living Setting: Patient's home;House;Lives with (comment) (Boyfriend Carloyn Manner lives with patient) Discharge Living Setting Number of Levels: 1 Discharge Living Setting Number of Steps: 3 at front and 5 at back Discharge Living Setting is Bedroom on Main Floor?: Yes Discharge Living Setting is Bathroom on Main Floor?: Yes  Social/Family/Support Systems: Has 2 daughters/ 1 son  Patient Roles: Parent Contact Information: Domingo Sep - Dtr (c) [phone], [place] ([phone] Anticipated Caregiver: Dtrs and boyfriend Anticipated Caregiver's Contact Information: Tammy [phone] Ability/Limitations of Caregiver: Tammy does not work but is a Oceanographer: 24/7 Discharge Plan Discussed with Primary Caregiver: Yes Is Caregiver In Agreement with Plan?: Yes Does Caregiver/Family have Issues with Lodging/Transportation while Pt is in Rehab?: No (Dtrs have been staying with patient at hospital)  Goals/Additional Needs:  Patient/Family Goal for Rehab: PT/OT mod I/S goals (ELOS = 2 weeks) Cultural Considerations: Ephriam Knuckles - attends church services Dietary Needs: Heart Healthy diet Equipment Needs: TBD Pt/Family Agrees to Admission and willing to participate: Yes Program Orientation Provided & Reviewed with Pt/Caregiver  Including Roles  & Responsibilities: Yes  Preadmission Screen Completed By:  Trish Mage, 03/07/2011 9:56 AM  Patient's condition:  This patient's condition remains as documented in the Consult dated 03/05/11, in which the Rehabilitation Physician determined and documented that the patient's condition is appropriate for intensive rehabilitative care in an inpatient rehabilitation facility.  Preadmission Screen Competed NF:AOZHY Koleen Distance, RN, Time/Date,1547/03/06/13.  Discussed status with Dr. Riley Kill on01/11/13 at 0957 (time/date) and received telephone approval for admission today.  Admission Coordinator:  Trish Mage, time0957/Date01/11/13

## 2011-03-06 NOTE — Op Note (Signed)
    TRANSESOPHAGEAL ECHOCARDIOGRAM   NAME:  Katelyn [name]   MRN: [id number] DOB:  [birth year]   ADMIT DATE: 03/01/2011  INDICATIONS:   CVA   PROCEDURE:   Informed consent was obtained prior to the procedure. The risks, benefits and alternatives for the procedure were discussed and the patient comprehended these risks.  Risks include, but are not limited to, cough, sore throat, vomiting, nausea, somnolence, esophageal and stomach trauma or perforation, bleeding, low blood pressure, aspiration, pneumonia, infection, trauma to the teeth and death.    After a procedural time-out, the patient was given 8 mg versed and 75 mcg fentanyl for moderate sedation.  The oropharynx was anesthetized 10cc of topical 1% viscous lidocaine.  The transesophageal probe was inserted in the esophagus and stomach without difficulty and multiple views were obtained.   Agitated microbubble saline contrast was administered.  COMPLICATIONS:    There were no immediate complications.  FINDINGS:  LEFT VENTRICLE: Moderate to severe LVH. EF = 65-70% No wall motion abnormalities.  RIGHT VENTRICLE: Normal  LEFT ATRIUM: Mildly dilated  RIGHT ATRIUM: Normally  AORTIC VALVE:  Trileaflet. Functionally bicuspid. LCC and RCC are fused. Moderate AI. No significant AS.  MITRAL VALVE:    Trivial MR  TRICUSPID VALVE: Trivial TR  INTERATRIAL SEPTUM: Trivial PFO. Minimally positive bubble study.  PERICARDIUM: Trivial effusion  DESCENDING AORTA: Mild plaque

## 2011-03-06 NOTE — Interval H&P Note (Signed)
History and Physical Interval Note:  03/06/2011 3:12 PM  Katelyn [name]  has presented today for surgery, with the diagnosis of CVA.  The various methods of treatment have been discussed with the patient and family. After consideration of risks, benefits and other options for treatment, the patient has consented to  Procedure(s): TRANSESOPHAGEAL ECHOCARDIOGRAM (TEE) as a surgical intervention .  The patients' history has been reviewed, patient examined, no change in status, stable for surgery.  I have reviewed the patients' chart and labs.  Questions were answered to the patient's satisfaction.     [name]

## 2011-03-07 ENCOUNTER — Inpatient Hospital Stay (HOSPITAL_COMMUNITY)
Admission: RE | Admit: 2011-03-07 | Discharge: 2011-03-19 | DRG: 945 | Disposition: A | Discharge status: Home Health | Payer: Medicare Other | Source: Ambulatory Visit | Attending: Physical Medicine & Rehabilitation | Admitting: Physical Medicine & Rehabilitation

## 2011-03-07 ENCOUNTER — Encounter (HOSPITAL_COMMUNITY): Payer: Self-pay | Admitting: Internal Medicine

## 2011-03-07 DIAGNOSIS — E785 Hyperlipidemia, unspecified: Secondary | ICD-10-CM

## 2011-03-07 DIAGNOSIS — I6350 Cerebral infarction due to unspecified occlusion or stenosis of unspecified cerebral artery: Secondary | ICD-10-CM

## 2011-03-07 DIAGNOSIS — A5900 Urogenital trichomoniasis, unspecified: Secondary | ICD-10-CM

## 2011-03-07 DIAGNOSIS — I10 Essential (primary) hypertension: Secondary | ICD-10-CM

## 2011-03-07 DIAGNOSIS — Z5189 Encounter for other specified aftercare: Secondary | ICD-10-CM

## 2011-03-07 DIAGNOSIS — I639 Cerebral infarction, unspecified: Secondary | ICD-10-CM | POA: Diagnosis present

## 2011-03-07 DIAGNOSIS — M79609 Pain in unspecified limb: Secondary | ICD-10-CM

## 2011-03-07 DIAGNOSIS — F17200 Nicotine dependence, unspecified, uncomplicated: Secondary | ICD-10-CM

## 2011-03-07 DIAGNOSIS — G8310 Monoplegia of lower limb affecting unspecified side: Secondary | ICD-10-CM

## 2011-03-07 DIAGNOSIS — I6330 Cerebral infarction due to thrombosis of unspecified cerebral artery: Secondary | ICD-10-CM

## 2011-03-07 LAB — URINE CULTURE

## 2011-03-07 LAB — URINALYSIS, ROUTINE W REFLEX MICROSCOPIC
Protein, ur: NEGATIVE mg/dL
Urobilinogen, UA: 1 mg/dL (ref 0.0–1.0)

## 2011-03-07 LAB — URINE MICROSCOPIC-ADD ON

## 2011-03-07 MED ORDER — PROMETHAZINE HCL 12.5 MG RE SUPP: 12.5000 mg | Freq: Four times a day (QID) | RECTAL | Status: DC | PRN

## 2011-03-07 MED ORDER — ROSUVASTATIN CALCIUM 10 MG PO TABS
10.0000 mg | ORAL_TABLET | Freq: Every day | ORAL | Status: DC
  Administered 2011-03-07 – 2011-03-18 (×12): 10 mg via ORAL
  Filled 2011-03-07 (×14): qty 1

## 2011-03-07 MED ORDER — NICOTINE 21 MG/24HR TD PT24
21.0000 mg | MEDICATED_PATCH | Freq: Every day | TRANSDERMAL | Status: DC
  Filled 2011-03-07 (×2): qty 1

## 2011-03-07 MED ORDER — TRAZODONE HCL 50 MG PO TABS: 25.0000 mg | ORAL_TABLET | Freq: Every evening | ORAL | Status: DC | PRN

## 2011-03-07 MED ORDER — PROMETHAZINE HCL 12.5 MG PO TABS: 12.5000 mg | ORAL_TABLET | Freq: Four times a day (QID) | ORAL | Status: DC | PRN

## 2011-03-07 MED ORDER — NICOTINE 21 MG/24HR TD PT24: 1.0000 | MEDICATED_PATCH | Freq: Every day | TRANSDERMAL | Status: DC

## 2011-03-07 MED ORDER — ASPIRIN 325 MG PO TABS: 325.0000 mg | ORAL_TABLET | Freq: Every day | ORAL | Status: DC

## 2011-03-07 MED ORDER — AMLODIPINE BESYLATE 5 MG PO TABS: 5.0000 mg | ORAL_TABLET | Freq: Two times a day (BID) | ORAL | Status: DC

## 2011-03-07 MED ORDER — METRONIDAZOLE 500 MG PO TABS
2000.0000 mg | ORAL_TABLET | ORAL | Status: AC
  Administered 2011-03-07: 2000 mg via ORAL
  Filled 2011-03-07: qty 4

## 2011-03-07 MED ORDER — NICOTINE 21 MG/24HR TD PT24
21.0000 mg | MEDICATED_PATCH | Freq: Every day | TRANSDERMAL | Status: DC
  Filled 2011-03-07 (×13): qty 1

## 2011-03-07 MED ORDER — ALUM & MAG HYDROXIDE-SIMETH 400-400-40 MG/5ML PO SUSP
30.0000 mL | ORAL | Status: DC | PRN
  Filled 2011-03-07: qty 30

## 2011-03-07 MED ORDER — TRAMADOL HCL 50 MG PO TABS: 50.0000 mg | ORAL_TABLET | Freq: Four times a day (QID) | ORAL | Status: DC | PRN

## 2011-03-07 MED ORDER — PROMETHAZINE HCL 25 MG/ML IJ SOLN: 12.5000 mg | Freq: Four times a day (QID) | INTRAMUSCULAR | Status: DC | PRN

## 2011-03-07 MED ORDER — ENOXAPARIN SODIUM 40 MG/0.4ML ~~LOC~~ SOLN
40.0000 mg | SUBCUTANEOUS | Status: DC
  Filled 2011-03-07: qty 0.4

## 2011-03-07 MED ORDER — ALBUTEROL SULFATE (5 MG/ML) 0.5% IN NEBU: 2.5000 mg | INHALATION_SOLUTION | RESPIRATORY_TRACT | Status: DC | PRN

## 2011-03-07 MED ORDER — ADULT MULTIVITAMIN W/MINERALS CH
1.0000 | ORAL_TABLET | Freq: Every day | ORAL | Status: DC
  Filled 2011-03-07 (×2): qty 1

## 2011-03-07 MED ORDER — AMLODIPINE BESYLATE 5 MG PO TABS
5.0000 mg | ORAL_TABLET | Freq: Two times a day (BID) | ORAL | Status: DC
  Administered 2011-03-07 – 2011-03-12 (×10): 5 mg via ORAL
  Filled 2011-03-07 (×14): qty 1

## 2011-03-07 MED ORDER — ROSUVASTATIN CALCIUM 10 MG PO TABS: 10.0000 mg | ORAL_TABLET | Freq: Every day | ORAL | Status: DC

## 2011-03-07 MED ORDER — BENZONATATE 200 MG PO CAPS: 200.0000 mg | ORAL_CAPSULE | Freq: Three times a day (TID) | ORAL | Status: AC | PRN

## 2011-03-07 MED ORDER — SENNOSIDES-DOCUSATE SODIUM 8.6-50 MG PO TABS: 1.0000 | ORAL_TABLET | Freq: Every evening | ORAL | Status: DC | PRN

## 2011-03-07 MED ORDER — ADULT MULTIVITAMIN W/MINERALS CH
1.0000 | ORAL_TABLET | Freq: Every day | ORAL | Status: DC
  Administered 2011-03-08 – 2011-03-19 (×12): 1 via ORAL
  Filled 2011-03-07 (×14): qty 1

## 2011-03-07 MED ORDER — ENOXAPARIN SODIUM 40 MG/0.4ML ~~LOC~~ SOLN
40.0000 mg | SUBCUTANEOUS | Status: DC
  Administered 2011-03-08 – 2011-03-18 (×11): 40 mg via SUBCUTANEOUS
  Filled 2011-03-07 (×13): qty 0.4

## 2011-03-07 MED ORDER — ACETAMINOPHEN 325 MG PO TABS: 650.0000 mg | ORAL_TABLET | Freq: Four times a day (QID) | ORAL | Status: AC | PRN

## 2011-03-07 MED ORDER — POLYETHYLENE GLYCOL 3350 17 G PO PACK
17.0000 g | PACK | Freq: Every day | ORAL | Status: DC | PRN
  Filled 2011-03-07: qty 1

## 2011-03-07 MED ORDER — AZITHROMYCIN 250 MG PO TABS: 250.0000 mg | ORAL_TABLET | Freq: Every day | ORAL | Status: AC

## 2011-03-07 MED ORDER — TRAZODONE HCL 50 MG PO TABS: 50.0000 mg | ORAL_TABLET | Freq: Every evening | ORAL | Status: DC | PRN

## 2011-03-07 MED ORDER — BISACODYL 10 MG RE SUPP
10.0000 mg | Freq: Every day | RECTAL | Status: DC | PRN
  Administered 2011-03-17: 10 mg via RECTAL
  Filled 2011-03-07: qty 1

## 2011-03-07 MED ORDER — POLYETHYLENE GLYCOL 3350 17 G PO PACK: 17.0000 g | PACK | Freq: Every day | ORAL | Status: AC

## 2011-03-07 MED ORDER — ACETAMINOPHEN 325 MG PO TABS: 325.0000 mg | ORAL_TABLET | ORAL | Status: DC | PRN

## 2011-03-07 MED ORDER — BENZONATATE 100 MG PO CAPS
200.0000 mg | ORAL_CAPSULE | Freq: Three times a day (TID) | ORAL | Status: DC | PRN
  Filled 2011-03-07: qty 2

## 2011-03-07 MED ORDER — FLEET ENEMA 7-19 GM/118ML RE ENEM
1.0000 | ENEMA | Freq: Once | RECTAL | Status: AC | PRN
  Filled 2011-03-07: qty 1

## 2011-03-07 MED ORDER — DIPHENHYDRAMINE HCL 12.5 MG/5ML PO ELIX: 12.5000 mg | ORAL_SOLUTION | Freq: Four times a day (QID) | ORAL | Status: DC | PRN

## 2011-03-07 MED ORDER — AMLODIPINE BESYLATE 5 MG PO TABS
5.0000 mg | ORAL_TABLET | Freq: Two times a day (BID) | ORAL | Status: DC
  Filled 2011-03-07 (×3): qty 1

## 2011-03-07 MED ORDER — GUAIFENESIN-DM 100-10 MG/5ML PO SYRP: 5.0000 mL | ORAL_SOLUTION | Freq: Four times a day (QID) | ORAL | Status: DC | PRN

## 2011-03-07 MED ORDER — ADULT MULTIVITAMIN W/MINERALS CH: 1.0000 | ORAL_TABLET | Freq: Every day | ORAL | Status: DC

## 2011-03-07 NOTE — Progress Notes (Signed)
Pt for transfer to IP Rehab at [facility]. Report called to [place], RN on 4000. She requests IV be left in place for transfer. IV is w/i date and flushes well. PT and family aware of transfer to room [phone] and agreeable. D/C instructions reviewed w/ pt and dtr. All ques answered, no further questions.  Will call [facility] to arrange for transfer.

## 2011-03-07 NOTE — Progress Notes (Signed)
Subjective: No specific complaints. Tolerated the TEE yesterday well.  Objective: Vital signs in last 24 hours: Filed Vitals:   03/06/11 2129 03/07/11 0156 03/07/11 0559 03/07/11 0825  BP: 136/82 132/83 114/77 147/114  Pulse: 91 78 80   Temp: 97.4 F (36.3 C) 97.8 F (36.6 C) 97.4 F (36.3 C)   TempSrc: Oral Oral Oral   Resp: 16 16 18    Height:      Weight:      SpO2: 97% 98%     Weight change:   Intake/Output Summary (Last 24 hours) at 03/07/11 0911 Last data filed at 03/07/11 0848  Gross per 24 hour  Intake    100 ml  Output    600 ml  Net   -500 ml    Physical Exam: General: Awake, Oriented, No acute distress. HEENT: EOMI. Neck: Supple CV: S1 and S2 Lungs: Clear to ascultation bilaterally Abdomen: Soft, Nontender, Nondistended, +bowel sounds. Ext: Good pulses. Trace edema.  Lab Results:  [facility] 03/05/11 0415  NA 140  K 3.7  CL 105  CO2 24  GLUCOSE 93  BUN 19  CREATININE 0.79  CALCIUM 10.2  MG --  PHOS --   No results found for this basename: AST:2,ALT:2,ALKPHOS:2,BILITOT:2,PROT:2,ALBUMIN:2 in the last 72 hours No results found for this basename: LIPASE:2,AMYLASE:2 in the last 72 hours  Basename 03/05/11 0415  WBC 9.8  NEUTROABS --  HGB 12.6  HCT 38.8  MCV 90.9  PLT 199   No results found for this basename: CKTOTAL:3,CKMB:3,CKMBINDEX:3,TROPONINI:3 in the last 72 hours No components found with this basename: POCBNP:3 No results found for this basename: DDIMER:2 in the last 72 hours No results found for this basename: HGBA1C:2 in the last 72 hours No results found for this basename: CHOL:2,HDL:2,LDLCALC:2,TRIG:2,CHOLHDL:2,LDLDIRECT:2 in the last 72 hours No results found for this basename: TSH,T4TOTAL,FREET3,T3FREE,THYROIDAB in the last 72 hours No results found for this basename: VITAMINB12:2,FOLATE:2,FERRITIN:2,TIBC:2,IRON:2,RETICCTPCT:2 in the last 72 hours  Micro Results: No results found for this or any previous visit (from the past 240  hour(s)).  Studies/Results: No results found.  Medications: I have reviewed the patient's current medications. Scheduled Meds:    . amLODipine  5 mg Oral BID  . aspirin  300 mg Rectal Daily   Or  . aspirin  325 mg Oral Daily  . azithromycin  250 mg Oral Daily  . enoxaparin  40 mg Subcutaneous Q24H  . hydrALAZINE  5 mg Intravenous Once  . mulitivitamin with minerals  1 tablet Oral Daily  . nicotine  21 mg Transdermal Daily  . polyethylene glycol  17 g Oral Daily  . rosuvastatin  10 mg Oral q1800  . DISCONTD: fentaNYL  250 mcg Intravenous Once  . DISCONTD: midazolam  10 mg Intravenous Once  . DISCONTD: sodium chloride  3 mL Intravenous Q12H   Continuous Infusions:    . DISCONTD: sodium chloride     PRN Meds:.acetaminophen, acetaminophen, albuterol, benzonatate, hydrALAZINE, ondansetron (ZOFRAN) IV, senna-docusate, traZODone, DISCONTD: sodium chloride, DISCONTD: benzocaine, DISCONTD: fentaNYL, DISCONTD: midazolam, DISCONTD: sodium chloride  Assessment/Plan: *CVA (cerebral infarction)  Carotid dopplers were done on 03/03/11 and ruled out ICA stenosis. A 2 D Echocardiogram was done on 03/03/11 and did not show an embolic source, although the study was sub optimal and could not entirely rule it out.  Neurology was consult and Dr. Thad Ranger with neurology evaluated the patient. She was further risk stratified with a lipid panel and started on Crestor to address her hyperlipidemia. She was put on Norvasc for  BP control and we will continue to monitor and adjust her anti-hypertensive medications to achieve good BP control. She is being treated with aspirin as well.  Patient had transesophageal echocardiogram on 03/06/2011 which showed moderate to severe LVH.  Ejection fraction 65-70%.  No wall motion abnormalities.  Trivial PFO.  Plan for discharge to inpatient rehabilitation today.   HYPERLIPIDEMIA  Started on Crestor.  Repeat fasting lipid panel as outpatient.  Acute bronchitis  Was started  on azithromycin 03/03/11. Has a chronic cough.  Defined a total of 5 days.  Malignant hypertensive urgency  She was placed on Norvasc upon admission. She subsequently developed hypotension after being given PRN hydralazine, prompting a change in her medications to Norvasc to BID dosing. She is now hypertensive and we will increase her Norvasc to 5 mg BID.  Currently improved. May need to add additional agent depending on clinical course.  Ataxia following cerebral infarction  Continue PT/OT. CIR referral for further rehab.   CKD (chronic kidney disease) stage I Stable.    Diastolic dysfunction  Currently clinically compensated. Control BP.   Tobacco abuse  Counseled. Declined nicotine patch.   Constipation  Start Miralax.  Disposition Discharge the patient either to inpatient rehabilitation today.    LOS: 6 days  Corisa Montini A, MD 03/07/2011, 9:11 AM

## 2011-03-07 NOTE — Plan of Care (Signed)
Overall Plan of Care [facility]) Patient Details Name: Katelyn [name] MRN: [id number] DOB: 1950/03/26  Diagnosis:  Rehabilitation for L CVA Primary Diagnosis:   R Hemiparesis  Co-morbidities: R foot drop, R sided sensory deficits  Functional Problem List  Patient demonstrates impairments in the following areas: Balance, Bladder, Bowel, Cognition, Medication Management and Safety  Basic ADL's: bathing and dressing Advanced ADL's: simple meal preparation, laundry and light housekeeping  Transfers:  bed mobility, bed to chair, toilet, tub/shower, car, furniture and floor Locomotion:  ambulation and stairs  Additional Impairments:  None  Anticipated Outcomes Item Anticipated Outcome  Eating/Swallowing   mod I   Basic self-care  Supervision  Tolieting  Remain continent Mod independent  Bowel/Bladder  Remain continent Mod independent   Transfers  Supervision  Locomotion  Supervision with RW and Min A for stairs  Communication  Mod I   Cognition  Modified I   Pain  Pain level < 3 on 1-10 scale  Safety/Judgment  Mod independent with safety precautions  Other  Skin: no breakdown   Therapy Plan: PT Frequency: 2-3 X/day, 60-90 minutes OT Frequency: 2-3 X/day, 60-90 minutes  ST Frequency: 1-2 x day / 30-60 minutes   Team Interventions: Item RN PT OT SLP SW TR Other  Self Care/Advanced ADL Retraining  x x      Neuromuscular Re-Education  x x      Therapeutic Activities  x x      UE/LE Strength Training/ROM  x       UE/LE Coordination Activities  x       Visual/Perceptual Remediation/Compensation         DME/Adaptive Equipment Instruction  x x      Therapeutic Exercise  x x      Balance/Vestibular Training  x x      Patient/Family Education  x x x     Cognitive Remediation/Compensation  x  x     Functional Mobility Training  x x      Ambulation/Gait Training  x       Clinical research associate   x       Community Reintegration  x       Dysphagia/Aspiration Film/video editor         Bladder Management x        Bowel Management x        Disease Management/Prevention x        Pain Management x        Medication Management x   x     Skin Care/Wound Management x        Splinting/Orthotics         Discharge Planning  x x x x    Psychosocial Support  x   x                       Team Discharge Planning: Destination:  Home Projected Follow-up:  Home Health and Outpatient, SLP TBD Projected Equipment Needs:  Walker Patient/family involved in discharge planning:  Yes  MD ELOS: 10d Medical Rehab Prognosis:  Good Assessment: 61 yo female admitted to acute care for L CVA, now requires CIR level PT,OT, 24/7 rehab RN adn daily PMR MD visits.

## 2011-03-07 NOTE — Progress Notes (Signed)
Pt transferred to [facility] via [facility] at this time. Pt dtrs in possession of pt's personal belongings and d/c paperwork. Pt in stable condition at d/c .

## 2011-03-07 NOTE — H&P (Signed)
Physical Medicine and Rehabilitation Admission H&P  Chief Complaint   Patient presents with   .  Extremity Weakness   :  HPI: Katelyn [name] is an 61 y.o. female with history of HTN, dyslipidemia, admitted on 01/05 with sudden onset of RLE weakness night prior to admission. BP elevated at admission 218/116. Evaluated by neurology who recommended full stroke workup and ASA for CVA prophylaxis. MRI brain with left cingulate gyrus non hemorrhagic infarct. 2 D echo with EF 55 -60 % with no wall abnormality UDS negative. Carotid dopplers without ICA stenosis. PT evaluation done 01/07 and patient note ot have decreased balance with tendency to veer to the right and difficulty advancing RLE. Blood pressures remain labile TEE done 01/10 revealing moderate to severe LVH, EF 65-70%, minimally positive bubble study with trivial MR.   Review of Systems  Eyes: Negative for blurred vision.  Respiratory: Negative for cough and shortness of breath.   Cardiovascular: Negative for chest pain and palpitations.  Gastrointestinal: Negative for nausea and vomiting.  Genitourinary: Positive for urgency.  Neurological: Positive for sensory change and focal weakness. Negative for headaches.  Psychiatric/Behavioral: Negative for depression. The patient does not have insomnia.    Past Medical History   Diagnosis  Date   .  Hypertension    .  Hypercholesteremia    .  Shortness of breath    .  Blood transfusion     Past Surgical History   Procedure  Date   .  Abdominal hysterectomy     Family History   Problem  Relation  Age of Onset   .  Anesthesia problems  Neg Hx    .  Hypotension  Neg Hx    .  Malignant hyperthermia  Neg Hx    .   Pseudochol deficiency  Diabetes  Neg Hx  Father, sister     Social History: Lives with boyfriend. Retired from custodial work. Independent and active till recently ( increased headaches and fatigue past couple of months) She reports that she has been smoking Cigarettes. One  PPD for past 30 years. She does not have any smokeless tobacco history on file. She reports that she does not drink alcohol or use illicit drugs. Drinks couple of pots of coffee daily. Children in town supportive and can check in on her past discharge. Boyfriend retired and can assist past discharge.    Allergies: No Known Allergies   Scheduled Medications:   .  amLODipine  5 mg  Oral  BID   .  aspirin  300 mg  Rectal  Daily    Or   .  aspirin  325 mg  Oral  Daily   .  azithromycin  250 mg  Oral  Daily   .  enoxaparin  40 mg  Subcutaneous  Q24H   .  fentaNYL  250 mcg  Intravenous  Once   .  hydrALAZINE  5 mg  Intravenous  Once   .  midazolam  10 mg  Intravenous  Once   .  mulitivitamin with minerals  1 tablet  Oral  Daily   .  nicotine  21 mg  Transdermal  Daily   .  polyethylene glycol  17 g  Oral  Daily   .  rosuvastatin  10 mg  Oral  q1800   .  sodium chloride  3 mL  Intravenous  Q12H    No current outpatient prescriptions on file as of 03/06/2011.    Home:  Home  Living  Lives With: Significant other [name])  Receives Help From: (family and [name])  Type of Home: House  Home Layout: One level  Home Access: Stairs to enter  Entrance Stairs-Rails: Left  Entrance Stairs-Number of Steps: 3-5  Bathroom Shower/Tub: Sports administrator: Standard  Bathroom Accessibility: Yes  How Accessible: Accessible via walker  Home Adaptive Equipment: None  Functional History:  Prior Function  Level of Independence: Independent with basic ADLs;Independent with homemaking with ambulation;Independent with gait  Able to Take Stairs?: Yes  Driving: No  Vocation: (Former Runner, broadcasting/film/video)  Functional Status:  Mobility:  Bed Mobility  Bed Mobility: Yes  Supine to Sit: 5: Supervision;HOB elevated (Comment degrees)  Supine to Sit Details (indicate cue type and reason): HOB 30*  Sit to Supine: 5: Supervision;HOB elevated (comment degrees)  Sit to Supine -  Details (indicate cue type and reason): HOB 30*  Sit to Supine - Right (DO NOT USE): 4: Min assist  Sit to Supine - Right Details DO NOT USE: assist for R LE onto bed  Transfers  Transfers: Yes  Sit to Stand: 4: Min assist;With upper extremity assist;From bed  Sit to Stand Details (indicate cue type and reason): assist for anterior wt shifta nd cues for hands  Stand to Sit: 4: Min assist;To chair/3-in-1;With upper extremity assist  Stand to Sit Details: assist to control descent, verbal cues for hand placement  Stand Pivot Transfers: 4: Min assist  Stand Pivot Transfer Details (indicate cue type and reason): assist for balance, 2 HHA, recliner to bed  Ambulation/Gait  Ambulation/Gait: Yes  Ambulation/Gait Assistance: 4: Min assist  Ambulation/Gait Assistance Details (indicate cue type and reason): min assist for balance and RW safety/direction; decreased ability to control RLE/increased time for correct foot placement  Ambulation Distance (Feet): 80 Feet  Assistive device: Rolling walker  Gait Pattern: Decreased step length - right;Decreased hip/knee flexion - right   ADL:  ADL  Eating/Feeding: Set up  Eating/Feeding Details (indicate cue type and reason): [name] states that she has been coughing when eating and drinking, but that this has been going on for weeks.  Where Assessed - Eating/Feeding: Chair  Grooming: Wash/dry hands;Wash/dry face;Brushing hair  Where Assessed - Grooming: Sitting, chair  Upper Body Bathing: Performed;Chest;Right arm;Left arm;Abdomen;Supervision/safety  Where Assessed - Upper Body Bathing: Sitting, bed;Unsupported  Lower Body Bathing: Performed;Minimal assistance  Lower Body Bathing Details (indicate cue type and reason): min assist for dynamic standing balance to wash periareas and min verbal cues for increased weight bearing through right LE  Where Assessed - Lower Body Bathing: Sit to stand from bed  Upper Body Dressing: Minimal assistance  Where  Assessed - Upper Body Dressing: Supported  Lower Body Dressing: Performed;Minimal assistance  Lower Body Dressing Details (indicate cue type and reason): to don underwear and don/doff socks  Where Assessed - Lower Body Dressing: Sit to stand from bed  Toilet Transfer: Moderate assistance  Toilet Transfer Method: Ambulating;Stand pivot  Acupuncturist: Regular height toilet;Grab bars  Toileting - Clothing Manipulation: Minimal assistance  Where Assessed - Toileting Clothing Manipulation: Standing  Toileting - Hygiene: Supervision/safety  Where Assessed - Toileting Hygiene: Standing  Tub/Shower Transfer: Not assessed  ADL Comments: Daughter present in room. Educated pt on trying to put more weight through R LE with standing ADL as she tends to put most of her weight through L LE. No buckling in standing of R LE with and without UE support. Pt with better insight into  her deficits and judgment as she states that she is not currently strong enough to be safe standing in the shower.  Cognition:  Cognition  Overall Cognitive Status: Other (comment) (appears functional for pt support level at home)  Arousal/Alertness: Awake/alert  Orientation Level: Oriented X4  Attention: Focused;Selective  Focused Attention: Appears intact  Selective Attention: Appears intact  Memory: Impaired  Memory Impairment: Retrieval deficit (2/4 items recalled after 10 min with cues, mod deficit)  Awareness: Appears intact  Problem Solving: Appears intact  Executive Function: (self monitoring and correction, ? physically impaired)  Reasoning: Appears intact (expressed concerns re independence, family having to help)  Behaviors: Other (comment) (expressed concern/frustration re mobility limitations)  Safety/Judgment: Appears intact (verbalized concern re fall risk)  Comments: Pt did recall cva diagnosis, items consumed for breakfast, and seeing SLP previous date. Suspect cogling skills are at or near baseline  and pt has ability to have 24 hours assist at home who can provide support. Suspect pt is frustrated with current situation and is able to verbalize need to use call bell for assist. RN reports pt requested her to stay while pt attempted to use bathroom due to fall concerns, indicating improved awarness compared to OT evaluation this w/e.  Cognition  Arousal/Alertness: Awake/alert  Overall Cognitive Status: Impaired  Attention: Impaired  Current Attention Level: Sustained  Orientation Level: Oriented X4  Safety/Judgement: Decreased safety judgement for tasks assessed  Decreased Safety/Judgement: Impulsive;Decreased awareness of need for assistance  Awareness of Deficits: Decreased awareness of deficits  Problem Solving: Requires assistance for problem solving  Blood pressure 130/48, pulse 93, temperature 98.8 F (37.1 C), temperature source Oral, resp. rate 21, height 4\' 3"  (1.295 m), weight 50.2 kg (110 lb 10.7 oz), SpO2 97.00%.   Physical Exam  Constitutional: She is oriented to person, place, and time. She appears well-developed and well-nourished.       Appear fatigued.   HENT:  Head: Normocephalic and atraumatic.       Wears full set dentures.  Eyes: Pupils are equal, round, and reactive to light.  Neck: Normal range of motion. Neck supple.  Cardiovascular: Normal rate and regular rhythm.  Exam reveals no gallop and no friction rub.   No murmur heard. Pulmonary/Chest: Effort normal and breath sounds normal.  Abdominal: Soft. Bowel sounds are normal.  Musculoskeletal:       Right Achilles tight.  Neurological: She is alert and oriented to person, place, and time.       Follows commands without difficulty.  Speech clear.  Mild right pronator drift. RLE weakness distally (3+/5) more than proximal (4/5).  Diminished sensation 1/2 to pp and lt right leg.  RUE grossly 4 to 4+/5 with minimal sensory deficit seen.  Mild right facial weakness with no other gross CN abnormalities seen  although she does squint a bit when tracking object (denies blurred or double vision however). Cognitively appropriate.  Skin: Skin is warm and dry.  Psychiatric: Judgment and thought content normal.       A bit withdrawn but cooperative.   Results for orders placed during the hospital encounter of 03/01/11 (from the past 48 hour(s))   BASIC METABOLIC PANEL Status: Abnormal    Collection Time    03/05/11 4:15 AM   Component  Value  Range  Comment    Sodium  140  135 - 145 (mEq/L)     Potassium  3.7  3.5 - 5.1 (mEq/L)     Chloride  105  96 - 112 (  mEq/L)     CO2  24  19 - 32 (mEq/L)     Glucose, Bld  93  70 - 99 (mg/dL)     BUN  19  6 - 23 (mg/dL)     Creatinine, Ser  1.61  0.50 - 1.10 (mg/dL)     Calcium  09.6  8.4 - 10.5 (mg/dL)     GFR calc non Af Amer  88 (*)  >90 (mL/min)     GFR calc Af Amer  >90  >90 (mL/min)    CBC Status: Normal    Collection Time    03/05/11 4:15 AM   Component  Value  Range  Comment    WBC  9.8  4.0 - 10.5 (K/uL)     RBC  4.27  3.87 - 5.11 (MIL/uL)     Hemoglobin  12.6  12.0 - 15.0 (g/dL)     HCT  04.5  40.9 - 46.0 (%)     MCV  90.9  78.0 - 100.0 (fL)     MCH  29.5  26.0 - 34.0 (pg)     MCHC  32.5  30.0 - 36.0 (g/dL)     RDW  81.1  91.4 - 15.5 (%)     Platelets  199  150 - 400 (K/uL)     No results found.  Post Admission Physician Evaluation:  1. Functional deficits secondary to a left cingulate gyrus non hemorrhagic infarct . 2. Patient is admitted to receive collaborative, interdisciplinary care between the physiatrist, rehab nursing staff, and therapy team. 3. Patient's level of medical complexity and substantial therapy needs in context of that medical necessity cannot be provided at a lesser intensity of care such as a SNF. 4. Patient has experienced substantial functional loss from his/her baseline which was documented above under the "Functional History" and "Functional Status" headings. Judging by the patient's diagnosis, physical exam, and  functional history, the patient has potential for functional progress which will result in measurable gains while on inpatient rehab. These gains will be of substantial and practical use upon discharge in facilitating mobility and self-care at the household level. 5. Physiatrist will provide 24 hour management of medical needs as well as oversight of the therapy plan/treatment and provide guidance as appropriate regarding the interaction of the two. 6. 24 hour rehab nursing will assist with bladder management, bowel management, safety, skin/wound care, disease management, medication administration, pain management and patient education and help integrate therapy concepts, techniques,education, etc. 7. PT will assess and treat for: LES, NMR, adaptive equipment training, fxnl mobility, safety. Goals are: modified indpendent to supervision. 8. OT will assess and treat for: UES, NMR, ADL's, safety, education, fxnl mobility. Goals are: modified indpendent to supervision. 9. SLP will assess and treat for: cognition/safety. Goals are: modified independent. 10. Case Management and Social Worker will assess and treat for psychological issues and discharge planning. 11. Team conference will be held weekly to assess progress toward goals and to determine barriers to discharge. 12. Patient will receive at least 3 hours of therapy per day at least 5 days per week. 13. ELOS and Prognosis: 7-10 days excellent.  The patient and i discussed the fact that safety judgement and impulse control will be important going forward in meeting expected discharge goals.  Medical Problem List and Plan:  1. DVT Prophylaxis/Anticoagulation: Lovenox.   2. Pain Management: Denies pain at present  3. Mood: Offer ego support. Educate patient about importance of medication management and compliance past discharge.  Pt was quite independent prior to arrival and was encouraged to use her energy in a positive manner in rehab.  4.HTN:  Monitor with bid checks. Continues to be labile.  Will further adjust medications as indicated.  5. Trivial PFO: Need to check BLE dopplers to rule out DVT. If positive will need coumadin for 3 months then aspirin alone   6. Nicotine abuse: Nicotine patch daily. Reiterate need to quit smoking.   7. Dyslipidemia: On crestor daily.   8. Acute bronchitis: Has history of chronic cough. Avelox completed. Chest exam clear today. Ivory Broad, MD 03/07/11 1700

## 2011-03-07 NOTE — Progress Notes (Signed)
Patient arrived from [facility] via [facility] at 1230. Alert and oriented x 3. Oriented to unit, rehab schedule, call bell system, and safety plan. Patient verbalized understanding. Will continue with plan of care. Hedy Camara

## 2011-03-07 NOTE — Progress Notes (Signed)
Rehab admissions - Patient cleared for rehab admit today.  Patient and daughter are agreeable.  Bed available and will admit to inpatient rehab today.  Pager [phone]

## 2011-03-07 NOTE — Progress Notes (Signed)
OT Cancellation Note  Treatment cancelled today due to pt w/ pending transfer this am to in-patient rehab (C[facility]) @ [facility].

## 2011-03-07 NOTE — Progress Notes (Signed)
Bilateral lower extremity venous duplex completed.  Preliminary report is negative for DVT, SVT, or a Baker's cyst. Smiley Houseman 03/07/2011, 3:05 PM

## 2011-03-07 NOTE — Discharge Summary (Signed)
Discharge Summary  Katelyn [name] MR#: [id number]  DOB:[birth year]  Date of Admission: 03/01/2011 Date of Discharge: 03/07/2011  Patient's PCP: Beverley Fiedler, MD, MD  Attending Physician:Winna Golla A  Consults: Dr. Thad Ranger Neurology  Discharge Diagnoses: Principal Problem:  *CVA (cerebral infarction) Active Problems:  HYPERLIPIDEMIA  Acute bronchitis  Malignant hypertensive urgency  Ataxia following cerebral infarction  CKD (chronic kidney disease) stage 3, GFR 30-59 ml/min  Diastolic dysfunction  Tobacco abuse  Constipation  Brief Admitting History and Physical 61 year old Latin American female with history of tobacco use who presented with sudden onset of right leg weakness on 03/01/2011.  Discharge Medications Current Discharge Medication List    START taking these medications   Details  acetaminophen (TYLENOL) 325 MG tablet Take 2 tablets (650 mg total) by mouth every 6 (six) hours as needed (or Fever >/= 101). Qty: 30 tablet    albuterol (PROVENTIL) (5 MG/ML) 0.5% nebulizer solution Take 0.5 mLs (2.5 mg total) by nebulization every 4 (four) hours as needed for wheezing or shortness of breath. Qty: 20 mL    amLODipine (NORVASC) 5 MG tablet Take 1 tablet (5 mg total) by mouth 2 (two) times daily.    aspirin 325 MG tablet Take 1 tablet (325 mg total) by mouth daily.    azithromycin (ZITHROMAX) 250 MG tablet Take 1 tablet (250 mg total) by mouth daily. Until 03/08/2011    benzonatate (TESSALON) 200 MG capsule Take 1 capsule (200 mg total) by mouth 3 (three) times daily as needed for cough. Qty: 20 capsule    Multiple Vitamin (MULITIVITAMIN WITH MINERALS) TABS Take 1 tablet by mouth daily.    nicotine (NICODERM CQ - DOSED IN MG/24 HOURS) 21 mg/24hr patch Place 1 patch (21 patches total) onto the skin daily.    polyethylene glycol (MIRALAX / GLYCOLAX) packet Take 17 g by mouth daily. Qty: 14 each    rosuvastatin (CRESTOR) 10 MG tablet Take 1 tablet (10 mg  total) by mouth daily at 6 PM.    senna-docusate (SENOKOT-S) 8.6-50 MG per tablet Take 1 tablet by mouth at bedtime as needed.    traZODone (DESYREL) 50 MG tablet Take 1 tablet (50 mg total) by mouth at bedtime as needed for sleep (insomnia).        Hospital Course: CVA (cerebral infarction)  Carotid dopplers were done on 03/03/11 and ruled out ICA stenosis. A 2 D Echocardiogram was done on 03/03/11 and did not show an embolic source, although the study was sub optimal and could not entirely rule it out.  Neurology was consult and Dr. Thad Ranger with neurology evaluated the patient. She was further risk stratified with a lipid panel and started on Crestor to address her hyperlipidemia. She was put on Norvasc for BP control and we will continue to monitor and adjust her anti-hypertensive medications to achieve good BP control. She was started on aspirin since admission.  Patient had transesophageal echocardiogram on 03/06/2011 which showed moderate to severe LVH.  Ejection fraction 65-70%.  No wall motion abnormalities.  Trivial PFO.  HYPERLIPIDEMIA  Started on Crestor.  Repeat fasting lipid panel as outpatient.  Acute bronchitis  Was started on azithromycin 03/03/11. Has a chronic cough.  Defined a total of 5 days, to be discontinued after 03/08/2011.  Malignant hypertensive urgency  She was placed on Norvasc upon admission. She subsequently developed hypotension after being given PRN hydralazine, prompting a change in her medications to Norvasc to BID dosing. She is now hypertensive and we will increase her Norvasc to 5 mg  BID.  Currently improved. May need to add additional agent depending on clinical course.  Ataxia following cerebral infarction  Continue PT/OT.   CKD (chronic kidney disease) stage I Stable.    Diastolic dysfunction  Currently clinically compensated. Control BP.   Tobacco abuse  Counseled. Declined nicotine patch.   Constipation  Start Miralax.  Day of Discharge BP  147/114  Pulse 80  Temp(Src) 97.4 F (36.3 C) (Oral)  Resp 18  Ht 4\' 3"  (1.295 m)  Wt 50.2 kg (110 lb 10.7 oz)  BMI 29.92 kg/m2  SpO2 98%  No results found for this or any previous visit (from the past 48 hour(s)).  Ct Head Wo Contrast  03/01/2011  *RADIOLOGY REPORT*  Clinical Data: Right lower extremity pain/weakness  CT HEAD WITHOUT CONTRAST  Technique:  Contiguous axial images were obtained from the base of the skull through the vertex without contrast.  Comparison: None.  Findings: No evidence of parenchymal hemorrhage or extra-axial fluid collection. No mass lesion, mass effect, or midline shift.  No CT evidence of acute infarction.  Subcortical white matter and periventricular small vessel ischemic changes.  Intracranial atherosclerosis.  Cerebral volume is age appropriate.  No ventriculomegaly.  The visualized paranasal sinuses are essentially clear. The mastoid air cells are unopacified.  No evidence of calvarial fracture.  IMPRESSION: No evidence of acute intracranial abnormality.  Small vessel ischemic changes with intracranial atherosclerosis.  Original Report Authenticated By: Charline Bills, M.D.   Mr Brain Wo Contrast  03/02/2011  *RADIOLOGY REPORT*  Clinical Data: Rule out TIA.  Right lower extremity pain and weakness.The examination had to be discontinued prior to completion due to claustrophobia.  MRI HEAD WITHOUT CONTRAST  Technique:  Multiplanar, multiecho pulse sequences of the brain and surrounding structures were obtained according to standard protocol without intravenous contrast.  Comparison: CT head without contrast 03/01/2011.  Findings: Diffusion weighted images demonstrate to a focal area of non hemorrhagic infarction measuring 2.5 cm in the left cingulate gyrus, compatible with the patient's right lower extremity symptoms.  T2 and FLAIR changes are evident in this area as well. No other focal acute infarction is evident.  There is no evidence for hemorrhage or mass  lesion.  The study is moderately degraded by patient motion.  Coronal T2-weighted images were not obtained.  Mild periventricular and subcortical T2 hyperintensities are advanced for age.  The ventricles are normal size.  No significant extra-axial fluid collection is present.  Flow is present in the major intracranial arteries.  The globes are grossly intact.  There is fluid in the right mastoid air cells.  No definite obstructing nasopharyngeal lesion is evident.  IMPRESSION:  1.  Acute non hemorrhagic infarct within the left cingulate gyrus, compatible with right lower extremity symptoms. 2.  Mild periventricular and subcortical white matter changes advanced for age. The finding is nonspecific but can be seen in the setting of chronic microvascular ischemia, a demyelinating process such as multiple sclerosis, vasculitis, complicated migraine headaches, or as the sequelae of a prior infectious or inflammatory process. 3.  The study is moderately degraded by patient motion and had to be discontinued secondary to patient claustrophobia.  These results were called by telephone on 03/02/2011  at  10:55 a.m. to  Dr. Darnelle Catalan, who verbally acknowledged these results.  Original Report Authenticated By: Jamesetta Orleans. MATTERN, M.D.   Dg Chest 1vsame Day  03/03/2011  *RADIOLOGY REPORT*  Clinical Data: Cough, new CVA  CHEST - 1 VIEW SAME DAY  Comparison: 03/01/2011  Findings: Cardiomediastinal silhouette is stable.  No acute infiltrate or pleural effusion.  No pulmonary edema.  Bony thorax is stable.  IMPRESSION: No active disease.  No significant change.  Original Report Authenticated By: Natasha Mead, M.D.   Dg Chest Portable 1 View  03/01/2011  *RADIOLOGY REPORT*  Clinical Data: Hypertension  PORTABLE CHEST - 1 VIEW  Comparison: None.  Findings: Lungs are clear. No pleural effusion or pneumothorax.  Cardiomediastinal silhouette is within normal limits.  IMPRESSION: No evidence of acute cardiopulmonary disease.  Original  Report Authenticated By: Charline Bills, M.D.   Patient had a 2-D echocardiogram on 03/03/2011 Study Conclusions - Left ventricle: The cavity size was normal. Systolic function was normal. The estimated ejection fraction was in the range of 55% to 60%. Wall motion was normal; there were no regional wall motion abnormalities. There was an increased relative contribution of atrial contraction to ventricular filling. Doppler parameters are consistent with abnormal left ventricular relaxation (grade 1 diastolic dysfunction). - Aortic valve: The AV is poorly visualized but appears focally calcified with mild central aortic insufficiency Valve area: 1.69cm^2(VTI). Valve area: 1.32cm^2 (Vmax). Impressions: - Embolic source cannot be excluded.  Transesophageal echocardiogram on 03/06/2011 FINDINGS:  LEFT VENTRICLE: Moderate to severe LVH. EF = 65-70% No wall motion abnormalities.  RIGHT VENTRICLE: Normal  LEFT ATRIUM: Mildly dilated  RIGHT ATRIUM: Normally  AORTIC VALVE: Trileaflet. Functionally bicuspid. LCC and RCC are fused. Moderate AI. No significant AS.  MITRAL VALVE: Trivial MR  TRICUSPID VALVE: Trivial TR  INTERATRIAL SEPTUM: Trivial PFO. Minimally positive bubble study.  PERICARDIUM: Trivial effusion  DESCENDING AORTA: Mild plaque   Disposition: To inpatient rehabilitation for physical therapy.  Diet: Heart healthy diet.  Activity: Resume as tolerated.   Follow-up Appts: Patient to followup with Dr. Barbaraann Barthel (PCP) in 1 week after discharge.  The patient followup with [facility] neurology in 2 months after discharge.  TESTS THAT NEED FOLLOW-UP None  Time spent on discharge, talking to the patient, and coordinating care: 35 mins.   Signed: Cristal Ford, MD 03/07/2011, 9:30 AM

## 2011-03-07 NOTE — Progress Notes (Signed)
Patient information reviewed and entered into [facility] system by Glennice Marcos, RN, CRRN, PPS Coordinator.  Information including medical coding and functional independence measure will be reviewed and updated through discharge.     Per nursing patient was given "Data Collection Information Summary for Patients in Inpatient Rehabilitation Facilities with attached "Privacy Act Statement-Health Care Records" upon admission.   

## 2011-03-08 DIAGNOSIS — G8110 Spastic hemiplegia affecting unspecified side: Secondary | ICD-10-CM

## 2011-03-08 DIAGNOSIS — Z5189 Encounter for other specified aftercare: Secondary | ICD-10-CM

## 2011-03-08 DIAGNOSIS — I6330 Cerebral infarction due to thrombosis of unspecified cerebral artery: Secondary | ICD-10-CM

## 2011-03-08 LAB — CBC
MCV: 91 fL (ref 78.0–100.0)
Platelets: 229 10*3/uL (ref 150–400)
RDW: 12.2 % (ref 11.5–15.5)
WBC: 9.6 10*3/uL (ref 4.0–10.5)

## 2011-03-08 LAB — COMPREHENSIVE METABOLIC PANEL
AST: 28 U/L (ref 0–37)
Albumin: 4.2 g/dL (ref 3.5–5.2)
Calcium: 10.4 mg/dL (ref 8.4–10.5)
Chloride: 102 mEq/L (ref 96–112)
Creatinine, Ser: 0.92 mg/dL (ref 0.50–1.10)
Total Bilirubin: 0.4 mg/dL (ref 0.3–1.2)
Total Protein: 8 g/dL (ref 6.0–8.3)

## 2011-03-08 LAB — DIFFERENTIAL
Lymphocytes Relative: 40 % (ref 12–46)
Lymphs Abs: 3.8 10*3/uL (ref 0.7–4.0)
Monocytes Absolute: 0.8 10*3/uL (ref 0.1–1.0)
Monocytes Relative: 8 % (ref 3–12)
Neutro Abs: 4.8 10*3/uL (ref 1.7–7.7)

## 2011-03-08 NOTE — Progress Notes (Signed)
Assessment & Plan  Speech Language Pathology Assessment and Plan  Patient Details  Name: Katelyn [name] MRN: [id number] Date of Birth: 1950/06/10  SLP Diagnosis: cognitive deficits  Rehab Potential: Good ELOS:  2 weeks    Time: 0904-1000 Time Calculation (min): 56 min  Assessment & Plan Clinical Impression: Patient is a 61 y.o. year old female with recent admission to the hospital on 01/05 with sudden onset of RLE weakness night prior to admission. Evaluated by neurology who recommended full stroke workup.  MRI brain with left cingulate gyrus non hemorrhagic infarct.  Patient transferred to CIR on 03/07/2011.  Patient's past medical history is significant for HTN, dyslipidemia, Hypercholesteremia shortness of breath and Blood transfusion. Pt was evaluated by speech therapy at [facility] in the acute venue on 03/04/11 and was found to be at baseline for her skills in the acute setting.   Pt participated in an evaluation today of speech-language-cognitive skills. A complete swallow evaluation was not warranted at this time as the Pt is reportedly tolerating a regular diet without difficulty. Expressive and receptive language skills, reading and writing skills and memory were felt to be at Pt's baseline level. Pt did exhibit deficits in sustained and selective attention, intellectual and emergent awareness and functional problem solving today. Goals were agreed upon to address deficits in theses areas. Please refer to the full SLP evaluation for details and  goals. Pt would benefit from skilled speech therapy intervention in the rehab setting to maximize her independence with cognitive skills for attention, awareness, problem solving and discharge preparation.        SLP - End of Session Activity Tolerance: Tolerates 30+ min activity without fatigue Patient left: in chair;with call bell in reach Assessment Rehab Potential: Good Barriers to Discharge: None Therapy Diagnosis: Cognitive  Impairments SLP Plan SLP Frequency: 1-2 X/day, 30-60 minutes SLP Treatment/Interventions: Cognitive remediation/compensation;Patient/family education Recommendation Follow up Recommendations: None  Precautions/Restrictions  Precautions Precautions: Fall Precaution Comments: safety awareness, attention to tasks Required Braces or Orthoses: No Restrictions Weight Bearing Restrictions: No General  Chart Reviewed: Yes Vital Signs Therapy Vitals Temp: 97.5 F (36.4 C) Temp src: Oral Pulse Rate: 74  Resp: 19  BP: 132/84 mmHg Patient Position, if appropriate: Sitting Oxygen Therapy SpO2: 97 % O2 Device: None (Room air) Pain Pain Assessment Pain Assessment: No/denies pain Prior Functioning Cognitive/Linguistic Baseline: Baseline deficits Baseline deficit details: reported baseline memory deficits per Pt, family and based on chart review (see eval at [facility] 03/04/11  Type of Home: House Lives With: Friend(s) Receives Help From: Family;Friend(s) Education: completed 9th grade  Vocation: On disability (per Pt ) Cognition Overall Cognitive Status: Impaired at baseline Arousal/Alertness: Awake/alert Orientation Level: Oriented X4 Attention: Sustained (needed min verbal cues for attention in quiet room ) Focused Attention: Appears intact Sustained Attention: Appears intact Sustained Attention Impairment: Verbal basic;Verbal complex;Functional basic;Functional complex Memory: Impaired Memory Impairment: Decreased recall of new information Awareness: Impaired Awareness Impairment: Anticipatory impairment (observed ambulating in room, unattended prior to treatment.) Problem Solving: Impaired Problem Solving Impairment: Verbal basic;Verbal complex;Functional basic;Functional complex (also difficulty with thought organization/reasoning tasks ) Executive Function: Sequencing;Organizing;Self Monitoring Reasoning: Impaired Reasoning Impairment: Verbal complex;Verbal  basic Sequencing: Appears intact (verbally assessed) Organizing: Appears intact Decision Making: Appears intact Initiating: Appears intact Self Monitoring: Impaired Self Monitoring Impairment: Verbal basic;Verbal complex;Functional basic Behaviors: Impulsive Safety/Judgment: Impaired Comments:  (observed ambulating w/o assistance (family present)) Comprehension Auditory Comprehension Overall Auditory Comprehension: Impaired Yes/No Questions: Within Functional Limits;Impaired Basic Immediate Environment Questions: 75-100% accurate Complex Questions:  75-100% accurate (90%) Paragraph Comprehension (via yes/no questions): 76-100% accurate (100% with overt hestiation in responses) Commands: Within Functional Limits;Impaired Complex Commands: 75-100% accurate Conversation: Complex (appears baseline) Interfering Components: Processing speed EffectiveTechniques: Extra processing time Visual Recognition/Discrimination Discrimination: Within Function Limits Reading Comprehension Reading Status: Within funtional limits Expression Expression Primary Mode of Expression: Verbal Verbal Expression Overall Verbal Expression: Appears within functional limits for tasks assessed Initiation: No impairment Level of Generative/Spontaneous Verbalization: Conversation Repetition: No impairment Naming: No impairment Confrontation: Within functional limits Divergent:  (7 animals in 60", max. decreased) Pragmatics: No impairment Interfering Components: Attention Written Expression Dominant Hand: Right (WFL for sentence level ) Written Expression: Within Functional Limits Oral/Motor Oral Motor/Sensory Function Overall Oral Motor/Sensory Function: Appears within functional limits for tasks assessed Motor Speech Overall Motor Speech: Appears within functional limits for tasks assessed   Recommendations for other services: None  Discharge Criteria: Patient will be discharged from SLP if patient  refuses treatment 3 consecutive times without medical reason, if treatment goals not met, if there is a change in medical status, if patient makes no progress towards goals or if patient is discharged from hospital.  The above assessment, treatment plan, treatment alternatives and goals were discussed and mutually agreed upon: by patient and by family  Mardene Celeste H. Eduard Penkala MA, CCC-SLP  03/08/2011 5:53 PM

## 2011-03-08 NOTE — Progress Notes (Signed)
Patient ID: Katelyn [name], female   DOB: [birth year], 61 y.o.   MRN: [id number] Subjective/Complaints: Slept ok.  Want to go home. Review of Systems  Neurological: Positive for focal weakness.  All other systems reviewed and are negative.   Objective: Vital Signs: Blood pressure 131/87, pulse 56, temperature 97.5 F (36.4 C), temperature source Oral, resp. rate 18, height 4' (1.219 m), weight 50.3 kg (110 lb 14.3 oz), SpO2 100.00%. No results found. Results for orders placed during the hospital encounter of 03/07/11 (from the past 72 hour(s))  URINALYSIS, ROUTINE W REFLEX MICROSCOPIC     Status: Abnormal   Collection Time   03/07/11  4:18 PM      Component Value Range Comment   Color, Urine YELLOW  YELLOW     APPearance CLOUDY (*) CLEAR     Specific Gravity, Urine 1.011  1.005 - 1.030     pH 5.5  5.0 - 8.0     Glucose, UA NEGATIVE  NEGATIVE (mg/dL)    Hgb urine dipstick TRACE (*) NEGATIVE     Bilirubin Urine NEGATIVE  NEGATIVE     Ketones, ur NEGATIVE  NEGATIVE (mg/dL)    Protein, ur NEGATIVE  NEGATIVE (mg/dL)    Urobilinogen, UA 1.0  0.0 - 1.0 (mg/dL)    Nitrite NEGATIVE  NEGATIVE     Leukocytes, UA LARGE (*) NEGATIVE    URINE MICROSCOPIC-ADD ON     Status: Abnormal   Collection Time   03/07/11  4:18 PM      Component Value Range Comment   Squamous Epithelial / LPF MANY (*) RARE     WBC, UA 21-50  <3 (WBC/hpf)    RBC / HPF 0-2  <3 (RBC/hpf)    Bacteria, UA FEW (*) RARE     Urine-Other TRICHOMONAS PRESENT     CBC     Status: Normal   Collection Time   03/08/11  6:13 AM      Component Value Range Comment   WBC 9.6  4.0 - 10.5 (K/uL)    RBC 4.31  3.87 - 5.11 (MIL/uL)    Hemoglobin 13.1  12.0 - 15.0 (g/dL)    HCT 40.9  81.1 - 91.4 (%)    MCV 91.0  78.0 - 100.0 (fL)    MCH 30.4  26.0 - 34.0 (pg)    MCHC 33.4  30.0 - 36.0 (g/dL)    RDW 78.2  95.6 - 21.3 (%)    Platelets 229  150 - 400 (K/uL)   COMPREHENSIVE METABOLIC PANEL     Status: Abnormal   Collection Time   03/08/11  6:13 AM      Component Value Range Comment   Sodium 138  135 - 145 (mEq/L)    Potassium 4.3  3.5 - 5.1 (mEq/L)    Chloride 102  96 - 112 (mEq/L)    CO2 26  19 - 32 (mEq/L)    Glucose, Bld 93  70 - 99 (mg/dL)    BUN 25 (*) 6 - 23 (mg/dL)    Creatinine, Ser 0.86  0.50 - 1.10 (mg/dL)    Calcium 57.8  8.4 - 10.5 (mg/dL)    Total Protein 8.0  6.0 - 8.3 (g/dL)    Albumin 4.2  3.5 - 5.2 (g/dL)    AST 28  0 - 37 (U/L)    ALT 15  0 - 35 (U/L)    Alkaline Phosphatase 93  39 - 117 (U/L)    Total Bilirubin 0.4  0.3 -  1.2 (mg/dL)    GFR calc non Af Amer 66 (*) >90 (mL/min)    GFR calc Af Amer 76 (*) >90 (mL/min)   DIFFERENTIAL     Status: Normal   Collection Time   03/08/11  6:13 AM      Component Value Range Comment   Neutrophils Relative 50  43 - 77 (%)    Neutro Abs 4.8  1.7 - 7.7 (K/uL)    Lymphocytes Relative 40  12 - 46 (%)    Lymphs Abs 3.8  0.7 - 4.0 (K/uL)    Monocytes Relative 8  3 - 12 (%)    Monocytes Absolute 0.8  0.1 - 1.0 (K/uL)    Eosinophils Relative 2  0 - 5 (%)    Eosinophils Absolute 0.2  0.0 - 0.7 (K/uL)    Basophils Relative 0  0 - 1 (%)    Basophils Absolute 0.0  0.0 - 0.1 (K/uL)    Physical Exam  Constitutional: She is oriented to person, place, and time. She appears well-developed and well-nourished.   HENT:  Head: Normocephalic and atraumatic.  Wears full set dentures.  Eyes: Pupils are equal, round, and reactive to light.  Neck: Normal range of motion. Neck supple.  Cardiovascular: Normal rate and regular rhythm. Exam reveals no gallop and no friction rub.  No murmur heard.  Pulmonary/Chest: Effort normal and breath sounds normal.  Abdominal: Soft. Bowel sounds are normal.  Musculoskeletal:  Right Achilles tight.  Neurological: She is alert and oriented to person, place, and time.  Follows commands without difficulty. Speech clear. Mild right pronator drift. RLE weakness distally2-/5 more than proximal (4/5). Diminished sensation 1/2 to pp and lt  right leg. RUE 5/5 with minimal sensory deficit seen. Mild right facial weakness with no other gross CN abnormalities seen although she does squint a bit when tracking object (denies blurred or double vision however). Cognitively appropriate.  Skin: Skin is warm and dry.  Psychiatric: Judgment and thought content normal.  A bit withdrawn but cooperative.  Assessment/Plan: 1. 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 Physiatrist is providing close team supervision and 24 hour management of active medical problems listed below. Physiatrist and rehab team continue to assess barriers to discharge/monitor patient progress toward functional and medical goals. Mobility:          ADL:    Cognition: Cognition Orientation Level: Oriented X4 Cognition Orientation Level: Oriented X4  Medical Problem List and Plan:  1. DVT Prophylaxis/Anticoagulation: Lovenox.  2. Pain Management: Denies pain at present  3. Mood: Offer ego support. Educate patient about importance of medication management and compliance past discharge. Pt was quite independent prior to arrival and was encouraged to use her energy in a positive manner in rehab.  4.HTN: Monitor with bid checks. Continues to be labile. Will further adjust medications as indicated.  5. Trivial PFO: Need to check BLE dopplers to rule out DVT. If positive will need coumadin for 3 months then aspirin alone  6. Nicotine abuse: Nicotine patch daily. Reiterate need to quit smoking.  7. Dyslipidemia: On crestor daily.  8.  Trichomonas UTI treated with flagyll   LOS (Days) 1 A FACE TO FACE EVALUATION WAS PERFORMED  Katelyn [name] 03/08/2011, 9:32 AM

## 2011-03-08 NOTE — Progress Notes (Signed)
Occupational Therapy Assessment and Plan  Patient Details  Name: Katelyn [name] MRN: [id number] Date of Birth: December 28, 1950  OT Diagnosis: ataxia, cognitive deficits and muscle weakness (generalized) Rehab Potential: Rehab Potential: Good ELOS:  (7-10 days)   Today's Date: 03/08/2011 Time: 0800-0900 Time Calculation (min): 60 min  Assessment & Plan Clinical Impression:Katelyn [name] is an 61 y.o. female with history of HTN, dyslipidemia, admitted on 01/05 with sudden onset of RLE weakness night prior to admission. BP elevated at admission 218/116. Evaluated by neurology who recommended full stroke workup and ASA for CVA prophylaxis. MRI brain with left cingulate gyrus non hemorrhagic infarct. 2 D echo with EF 55 -60 % with no wall abnormality UDS negative. Carotid dopplers without ICA stenosis. PT evaluation done 01/07 and patient note ot have decreased balance with tendency to veer to the right and difficulty advancing RLE. Blood pressures remain labile TEE done 01/10 revealing moderate to severe LVH, EF 65-70%, minimally positive bubble study with trivial MR.   Patient transferred to CIR on 03/07/2011 .   Patient currently requires min with basic self-care skills secondary to ataxia and decreased standing balance and difficulty maintaining precautions.  Prior to hospitalization, patient could complete BADL/IADL with independently.  Patient will benefit from skilled OT intervention to increase independence with basic self-care skills and increase level of independence with iADL prior to discharge home with supervision.  Anticipate patient will require intermittent supervision and no further OT follow recommended.  OT - End of Session Activity Tolerance: Tolerates 30+ min activity without fatigue Endurance Deficit: No  OT Assessment Rehab Potential: Good Barriers to Discharge: None  OT Plan OT Frequency: 2-3 X/day, 60-90 minutes Estimated Length of Stay:  (7-10 days) OT  Treatment/Interventions: Cognitive remediation/compensation;Therapeutic Activities;Therapeutic Exercise;Patient/family education;Self Care/advanced ADL retraining;DME/adaptive equipment instruction;Balance/vestibular training;Functional mobility training  Precautions/Restrictions  Precautions: Fall Precaution Comments:  (impaired dynamic standing balance, impaired safety awareness) Required Braces or Orthoses: No Weight Bearing Restrictions: No  General Chart Reviewed: Yes Family/Caregiver Present: Yes  Pain 0/10; no pain reported   Home Living/Prior Functioning Home, Lives With: Friend([name]) Receives Help From: Family;Other (Comment) (significant other: boyfriend [name]) Type of Home: House Home Layout: One level Home Access: Stairs to enter Entrance Stairs-Rails: Left Entrance Stairs-Number of Steps: 3-5 Bathroom Shower/Tub: Tub/shower unit;Curtain Firefighter: Standard Bathroom Accessibility: Yes How Accessible: Accessible via walker Home Adaptive Equipment: None  IADL History Homemaking Responsibilities: Yes Meal Prep Responsibility: Primary Laundry Responsibility: Primary Cleaning Responsibility: Primary Shopping Responsibility: Primary Child Care Responsibility: No Current License: No Mode of Transportation: Car;Friends Occupation: Retired Type of Occupation: Copy, 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) Leisure and Hobbies: taking walks Prior Function Level of Independence: Independent with basic ADLs;Independent with homemaking with ambulation;Independent with gait;Independent with transfers Able to Take Stairs?: Yes Driving: No Vocation: Retired Leisure: Hobbies-no  ADL Grooming: Setup Where Assessed-Grooming: Sitting at sink Upper Body Bathing: Supervision/safety Where Assessed-Upper Body Bathing: Shower Lower Body Bathing: Supervision/safety Where Assessed-Lower Body Bathing: Shower Upper Body Dressing: Supervision/safety Where  Assessed-Upper Body Dressing: Edge of bed Lower Body Dressing: Minimal assistance Where Assessed-Lower Body Dressing: Edge of bed Toileting: Not assessed Where Assessed-Toileting: Teacher, adult education: Scientific laboratory technician Method: Ambulating;Stand pivot Acupuncturist: Grab bars Tub/Shower Transfer: Minimal assistance;Contact guard Tub/Shower Transfer Method: Stand pivot;Ambulating Tub/Shower Equipment: Other (comment);Walk in shower (3:1 as shower chair) Psychologist, counselling Transfer: Minimal assistance;Contact guard Film/video editor Method: Ambulating;Stand pivot Astronomer: Grab bars;Other (comment) (3:1 as shower chair)  Vision/Perception  Vision -  History Baseline Vision: Wears glasses only for reading Patient Visual Report: Blurring of vision Vision Assessment: Vision not tested Perception: Within Functional Limits Praxis: Intact   Cognition Overall Cognitive Status: Impaired Arousal/Alertness: Awake/alert Orientation Level: Oriented X4 Attention: Focused;Sustained Focused Attention: Appears intact Sustained Attention: Appears intact Memory: Impaired Memory Impairment: Retrieval deficit Awareness: Impaired Awareness Impairment: Anticipatory impairment (observed ambulating in room, unattended prior to treatment.) Problem Solving: Impaired Problem Solving Impairment: Functional complex Executive Function: Sequencing;Organizing;Initiating;Reasoning;Decision Making Reasoning: Appears intact Sequencing: Appears intact Organizing: Appears intact Decision Making: Appears intact Initiating: Appears intact Behaviors: Impulsive Safety/Judgment: Impaired Comments:  (observed ambulating w/o assistance (family present))  Sensation Light Touch: Appears Intact Stereognosis: Appears Intact Hot/Cold: Appears Intact Proprioception: Appears Intact  Coordination Gross Motor Movements are Fluid and Coordinated: Yes (@  UE) Fine Motor Movements are Fluid and Coordinated: Yes (@ UE) Coordination and Movement Description: Ataxia, lower extremity due to motor control deficits  Motor  Motor: Ataxia;Abnormal tone Motor - Skilled Clinical Observations: decreased motor control for gross and graded movements (@ lower extremity)  Mobility  Bed Mobility: Yes Supine to Sit: 5: Supervision;HOB flat Supine to Sit Details: Visual cues/gestures for precautions/safety;Verbal cues for sequencing;Verbal cues for technique;Verbal cues for precautions/safety Transfers Transfers: Yes Sit to Stand: 4: Min assist Sit to Stand Details: Visual cues for safe use of DME/AE;Other (comment) (contact guard for safety) Stand to Sit: 4: Min assist;With upper extremity assist;With armrests;To bed;To chair/3-in-1   Trunk/Postural Assessment  Postural Control Postural Control: Within Functional Limits   Balance Static Sitting Balance Static Sitting - Balance Support: No upper extremity supported;Feet unsupported Static Sitting - Level of Assistance: 5: Stand by assistance Dynamic Sitting Balance Dynamic Sitting - Balance Support: No upper extremity supported;Feet unsupported Dynamic Sitting - Level of Assistance: 5: Stand by assistance  Extremity/Trunk Assessment RUE Assessment: Within Functional Limits LUE Assessment: Within Functional Limits  Recommendations for other services: None  Discharge Criteria: Patient will be discharged from OT if patient refuses treatment 3 consecutive times without medical reason, if treatment goals not met, if there is a change in medical status, if patient makes no progress towards goals or if patient is discharged from hospital.  The above assessment, treatment plan, treatment alternatives and goals were discussed and mutually agreed upon: by patient  Georgeanne Nim 03/08/2011, 4:18 PM

## 2011-03-08 NOTE — Progress Notes (Addendum)
Physical Therapy Assessment and Plan  Patient Details  Name: Katelyn [name] MRN: [id number] Date of Birth: Jan 19, 1951  PT Diagnosis: Abnormality of gait, Ataxia, Ataxic gait, Hypertonia, Impaired cognition and Impaired sensation Rehab Potential: Excellent ELOS: 10 days  Today's Date: 03/08/2011 Time: 4401-0272 Time Calculation (min): 60 min  Assessment & Plan Clinical Impression: Katelyn [name] is an 61 y.o. female with history of HTN, dyslipidemia, admitted on 01/05 with sudden onset of RLE weakness night prior to admission. BP elevated at admission 218/116. Evaluated by neurology who recommended full stroke workup and ASA for CVA prophylaxis. MRI brain with left cingulate gyrus non hemorrhagic infarct. 2 D echo with EF 55 -60 % with no wall abnormality UDS negative. Carotid dopplers without ICA stenosis. PT evaluation done 01/07 and patient note ot have decreased balance with tendency to veer to the right and difficulty advancing RLE. Blood pressures remain labile TEE done 01/10 revealing moderate to severe LVH, EF 65-70%, minimally positive bubble study with trivial MR.   Patient transferred to CIR on 03/07/2011 .    Patient currently requires min with mobility and Max A with gait using no device, Mod A with RW, secondary to impaired timing and sequencing, abnormal tone, unbalanced muscle activation, ataxia, decreased coordination, severely decreased sensation and proprioception, and decreased motor planning.  Prior to hospitalization, patient was Independent with mobility and lived with Significant other [name]) in a House.  Home access is 3-5Stairs to enter.  Patient will benefit from skilled PT intervention to maximize safe functional mobility, minimize fall risk and decrease caregiver burden for planned discharge home with 24 hour assist.  Anticipate patient will benefit from follow up OP if transportation available at discharge.  PT - End of Session Activity Tolerance: Tolerates  30+ min activity with multiple rests PT Assessment Rehab Potential: Excellent Barriers to Discharge: None PT Plan PT Frequency: 2-3 X/day, 60-90 minutes Estimated Length of Stay: 2 weeks PT Treatment/Interventions: Ambulation/gait training;Balance/vestibular training;Cognitive remediation/compensation;Community reintegration;DME/adaptive equipment instruction;Functional mobility training;Functional electrical stimulation;Neuromuscular re-education;Patient/family education;Stair training;Therapeutic Activities;Therapeutic Exercise;UE/LE Strength taining/ROM;UE/LE Coordination activities;Wheelchair propulsion/positioning PT Recommendation Follow Up Recommendations: Outpatient PT;Home health PT Equipment Recommended: Rolling walker with 5" wheels (Youth walker, pt is 4'3")  Precautions/Restrictions Precautions Precautions: Fall Restrictions Weight Bearing Restrictions: No    Pain Pain Assessment Pain Assessment: No/denies pain Home Living/Prior Functioning Home Living Lives With: Significant other [name]) Receives Help From: Family Type of Home: House Home Layout: One level Home Access: Stairs to enter Entrance Stairs-Rails: Left Entrance Stairs-Number of Steps: 3-5 Bathroom Shower/Tub: Forensic scientist: Standard Bathroom Accessibility: Yes How Accessible: Accessible via walker Home Adaptive Equipment: None Prior Function Level of Independence: Independent with basic ADLs;Independent with homemaking with ambulation;Independent with gait Able to Take Stairs?: Yes Driving: No Vocation: Retired Optometrist - History Baseline Vision: Wears glasses only for reading Patient Visual Report: Other (comment) (No c/o blurred vision) Perception Perception: Within Functional Limits Praxis Praxis: Intact  Cognition Arousal/Alertness: Awake/alert Orientation Level: Oriented X4 Attention: Focused;Selective Focused Attention: Appears  intact Selective Attention: Appears intact Memory: Impaired Sensation Sensation Light Touch: Impaired Detail Light Touch Impaired Details: Absent RLE (RUE intact) Proprioception: Impaired Detail Proprioception Impaired Details: Absent RLE (Absent toe through hip, intact RUE) Coordination Gross Motor Movements are Fluid and Coordinated: No Fine Motor Movements are Fluid and Coordinated: No Coordination and Movement Description: Ataxic toe taps, dysmetria due to motor control impairment Heel Shin Test: Ataxic  Motor  Motor Motor: Ataxia;Abnormal tone Motor - Skilled Clinical Observations: decreased motor control for gross  and graded movements. Flexion synergy noted in R hip/knee with supine bridging for extension.   Mobility Bed Mobility Bed Mobility: Yes Supine to Sit: 5: Supervision;HOB flat Supine to Sit Details: Visual cues/gestures for precautions/safety;Verbal cues for sequencing;Verbal cues for technique;Verbal cues for precautions/safety Supine to Sit Details (indicate cue type and reason): Pt relies heavily on UEs to pull up to sit, but does so safely.  Transfers Transfers: Yes Sit to Stand: 4: Min assist;With upper extremity assist;With armrests;From chair/3-in-1;From bed Sit to Stand Details: Verbal cues for precautions/safety;Verbal cues for technique;Visual cues for safe use of DME/AE Sit to Stand Details (indicate cue type and reason): Definite use of bil UEs with tendancy to lean L, decreased R LE engagement Stand to Sit: 4: Min assist;With upper extremity assist;With armrests;To bed;To chair/3-in-1 Stand to Sit Details (indicate cue type and reason): Verbal cues for precautions/safety;Visual cues/gestures for sequencing Stand to Sit Details: Pt unable to recall cues for hand placement between trials Stand Pivot Transfers: 3: Mod assist Stand Pivot Transfer Details: Verbal cues for sequencing;Verbal cues for technique;Verbal cues for precautions/safety;Verbal cues for  safe use of DME/AE Stand Pivot Transfer Details (indicate cue type and reason): Cues for sequencing and foot placement. Pt required Mod A for safe turning from [facility] to bed due to fatigue Locomotion  Ambulation Ambulation: Yes Ambulation/Gait Assistance: 2: Max assist Ambulation Distance (Feet): 10 Feet Assistive device: None Ambulation/Gait Assistance Details: Tactile cues for initiation;Tactile cues for weight shifting;Tactile cues for posture;Visual cues for safe use of DME/AE;Visual cues/gestures for precautions/safety;Verbal cues for sequencing;Verbal cues for technique;Verbal cues for precautions/safety;Verbal cues for gait pattern;Verbal cues for safe use of DME/AE;Manual facilitation for weight shifting Ambulation/Gait Assistance Details (indicate cue type and reason): Pt required Max A for steadying wihtout device, tending to lean to R and unable to accurately place RLE. With RW, pt needed cues for RW proximity to self and to center as well as R heel strike . Pt unable to maintain straight path due to focsuing on RLE control.  Gait Gait: Yes Gait Pattern: Impaired Gait Pattern: Right hip hike;Right circumduction;Step-through pattern;Decreased stride length;Right steppage;Lateral trunk lean to right Stairs / Additional Locomotion Stairs: Yes Stairs Assistance: 2: Max assist Stairs Assistance Details: Tactile cues for sequencing;Tactile cues for placement;Visual cues for safe use of DME/AE;Visual cues/gestures for precautions/safety;Verbal cues for technique;Verbal cues for precautions/safety;Verbal cues for sequencing;Verbal cues for gait pattern;Verbal cues for safe use of DME/AE;Manual facilitation for placement Stairs Assistance Details (indicate cue type and reason): Pt unable to perform correct gait pattern even while stating it. A needed for steadying especially as she is unable to clear step with RLE Stair Management Technique: Two rails;Step to pattern;Forwards Number of Stairs: 5   Height of Stairs: 6  Wheelchair Mobility Wheelchair Mobility: Yes Wheelchair Assistance: 3: Armed forces technical officer Details: Verbal cues for technique;Verbal cues for sequencing;Verbal cues for precautions/safety;Verbal cues for safe use of DME/AE Wheelchair Propulsion: Both upper extremities Wheelchair Parts Management: Supervision/cueing Distance: 50  Trunk/Postural Assessment  Cervical Assessment Cervical Assessment: Within Functional Limits Thoracic Assessment Thoracic Assessment: Within Functional Limits Lumbar Assessment Lumbar Assessment: Within Functional Limits Postural Control Postural Control: Within Functional Limits  Balance Balance Balance Assessed: Yes Standardized Balance Assessment Standardized Balance Assessment: Dynamic Gait Index Dynamic Gait Index Level Surface: Moderate Impairment Change in Gait Speed: Moderate Impairment Gait with Horizontal Head Turns: Severe Impairment Gait with Vertical Head Turns: Severe Impairment Steps: Moderate Impairment Static Sitting Balance Static Sitting - Balance Support: No upper extremity supported;Feet unsupported Static  Sitting - Level of Assistance: 5: Stand by assistance Dynamic Sitting Balance Dynamic Sitting - Balance Support: No upper extremity supported;Feet unsupported Dynamic Sitting - Level of Assistance: 4: Min assist Dynamic Sitting - Balance Activities: Lateral lean/weight shifting;Forward lean/weight shifting;Reaching for objects Static Standing Balance Static Standing - Balance Support: No upper extremity supported Static Standing - Level of Assistance: 4: Min assist Extremity Assessment  RUE Assessment RUE Assessment: Exceptions to [facility] (Defer to OT) LUE Assessment LUE Assessment: Within Functional Limits RLE Assessment RLE Assessment: Exceptions to [facility] RLE Strength Right Hip Flexion: 3+/5 Right Hip ABduction: 4/5 Right Hip ADduction: 3+/5 Right Knee Extension: 4/5 Right Ankle  Dorsiflexion: 4/5 LLE Assessment LLE Assessment: Within Functional Limits LLE Strength LLE Overall Strength Comments: grossly WFL except hip flexion 4-/5  Initiated tx: Supine bridging and PNF diagonals for RLE to facilitate increased motor control and break up synergy. When pt attempted to extend hips for bridging, R hip flexed synergistically unless held down. Performance of bridging improved following PNF diagonals focusing on extension as well as visual demonstration.  Recommendations for other services: None  Discharge Criteria: Patient will be discharged from PT if patient refuses treatment 3 consecutive times without medical reason, if treatment goals not met, if there is a change in medical status, if patient makes no progress towards goals or if patient is discharged from hospital.  The above assessment, treatment plan, treatment alternatives and goals were discussed and mutually agreed upon: by patient  Iona Coach 03/08/2011, 11:55 AM

## 2011-03-08 NOTE — Progress Notes (Signed)
Physical Therapy Session Note  Patient Details  Name: Katelyn [name] MRN: [id number] Date of Birth: [birth year]  Today's Date: 03/08/2011 Time: 1430-1500 Time Calculation (min): 30 min  Precautions: Precautions Precautions: Fall Restrictions Weight Bearing Restrictions: No  Short Term Goals: PT Short Term Goal 1: Sit<>Stand Supervision PT Short Term Goal 1 - Progress: Progressing toward goal PT Short Term Goal 2: Gait x150' with RW supervision PT Short Term Goal 2 - Progress: Progressing toward goal PT Short Term Goal 3: Up/Down 5 stairs with 1 rail with Min A PT Short Term Goal 3 - Progress: Progressing toward goal PT Short Term Goal 4: DGI Score>19 to decrease falls risk PT Short Term Goal 4 - Progress: Progressing toward goal  Skilled Therapeutic Interventions/Progress Updates:  Pt with no c/o pain.  Tx focused on gait training on treadmill to increase time on task and gait pattern. Harness was used for safety. Pt able to walk 2x2:30: 1st trail increasing pace from 0.75ft/sec to 1.2 ft/sec, completing 190'. 2nd trial at 1.77ft/sec, completing 240'. Pt tended to alternate between stomping feet and shuffling with quick steps and was somewhat able to adjust with cues for smooth strides and long steps. Several times, pt waited too long to step and legs traveled back, but pt able to correct with verbal cues and manual facilitation.  Completed DGI, with score of 8, most difficulty with head turns in either direction, leading to significant path deviations. Pt able to change gait speed, but not safely. Pt seems to have decreased awareness of deficits, but is able to incorporate feedback for improvement.  Gait speed on ground approximately 1.41ft/sec at fast, but safe pace.     Therapy/Group: Individual Therapy  Iona Coach, PT 03/08/2011, 3:11 PM

## 2011-03-09 MED ORDER — ASPIRIN EC 325 MG PO TBEC
325.0000 mg | DELAYED_RELEASE_TABLET | Freq: Every day | ORAL | Status: DC
  Administered 2011-03-09 – 2011-03-19 (×11): 325 mg via ORAL
  Filled 2011-03-09 (×16): qty 1

## 2011-03-09 NOTE — Progress Notes (Signed)
Patient ID: Katelyn [name], female   DOB: 1950-03-13, 61 y.o.   MRN: [id number] Subjective/Complaints: Slept ok.   Review of Systems  Neurological: Positive for focal weakness.  All other systems reviewed and are negative.  1/13 Objective: Vital Signs: Blood pressure 138/90, pulse 73, temperature 97.6 F (36.4 C), temperature source Oral, resp. rate 18, height 4' (1.219 m), weight 50.3 kg (110 lb 14.3 oz), SpO2 93.00%. No results found. Results for orders placed during the hospital encounter of 03/07/11 (from the past 72 hour(s))  URINALYSIS, ROUTINE W REFLEX MICROSCOPIC     Status: Abnormal   Collection Time   03/07/11  4:18 PM      Component Value Range Comment   Color, Urine YELLOW  YELLOW     APPearance CLOUDY (*) CLEAR     Specific Gravity, Urine 1.011  1.005 - 1.030     pH 5.5  5.0 - 8.0     Glucose, UA NEGATIVE  NEGATIVE (mg/dL)    Hgb urine dipstick TRACE (*) NEGATIVE     Bilirubin Urine NEGATIVE  NEGATIVE     Ketones, ur NEGATIVE  NEGATIVE (mg/dL)    Protein, ur NEGATIVE  NEGATIVE (mg/dL)    Urobilinogen, UA 1.0  0.0 - 1.0 (mg/dL)    Nitrite NEGATIVE  NEGATIVE     Leukocytes, UA LARGE (*) NEGATIVE    URINE CULTURE     Status: Normal   Collection Time   03/07/11  4:18 PM      Component Value Range Comment   Specimen Description URINE, CLEAN CATCH      Special Requests NONE      Setup Time [id number]      Colony Count 6,000 COLONIES/ML      Culture INSIGNIFICANT GROWTH      Report Status 03/08/2011 FINAL     URINE MICROSCOPIC-ADD ON     Status: Abnormal   Collection Time   03/07/11  4:18 PM      Component Value Range Comment   Squamous Epithelial / LPF MANY (*) RARE     WBC, UA 21-50  <3 (WBC/hpf)    RBC / HPF 0-2  <3 (RBC/hpf)    Bacteria, UA FEW (*) RARE     Urine-Other TRICHOMONAS PRESENT     CBC     Status: Normal   Collection Time   03/08/11  6:13 AM      Component Value Range Comment   WBC 9.6  4.0 - 10.5 (K/uL)    RBC 4.31  3.87 - 5.11 (MIL/uL)    Hemoglobin 13.1  12.0 - 15.0 (g/dL)    HCT 41.3  24.4 - 01.0 (%)    MCV 91.0  78.0 - 100.0 (fL)    MCH 30.4  26.0 - 34.0 (pg)    MCHC 33.4  30.0 - 36.0 (g/dL)    RDW 27.2  53.6 - 64.4 (%)    Platelets 229  150 - 400 (K/uL)   COMPREHENSIVE METABOLIC PANEL     Status: Abnormal   Collection Time   03/08/11  6:13 AM      Component Value Range Comment   Sodium 138  135 - 145 (mEq/L)    Potassium 4.3  3.5 - 5.1 (mEq/L)    Chloride 102  96 - 112 (mEq/L)    CO2 26  19 - 32 (mEq/L)    Glucose, Bld 93  70 - 99 (mg/dL)    BUN 25 (*) 6 - 23 (mg/dL)    Creatinine, Ser  0.92  0.50 - 1.10 (mg/dL)    Calcium 40.9  8.4 - 10.5 (mg/dL)    Total Protein 8.0  6.0 - 8.3 (g/dL)    Albumin 4.2  3.5 - 5.2 (g/dL)    AST 28  0 - 37 (U/L)    ALT 15  0 - 35 (U/L)    Alkaline Phosphatase 93  39 - 117 (U/L)    Total Bilirubin 0.4  0.3 - 1.2 (mg/dL)    GFR calc non Af Amer 66 (*) >90 (mL/min)    GFR calc Af Amer 76 (*) >90 (mL/min)   DIFFERENTIAL     Status: Normal   Collection Time   03/08/11  6:13 AM      Component Value Range Comment   Neutrophils Relative 50  43 - 77 (%)    Neutro Abs 4.8  1.7 - 7.7 (K/uL)    Lymphocytes Relative 40  12 - 46 (%)    Lymphs Abs 3.8  0.7 - 4.0 (K/uL)    Monocytes Relative 8  3 - 12 (%)    Monocytes Absolute 0.8  0.1 - 1.0 (K/uL)    Eosinophils Relative 2  0 - 5 (%)    Eosinophils Absolute 0.2  0.0 - 0.7 (K/uL)    Basophils Relative 0  0 - 1 (%)    Basophils Absolute 0.0  0.0 - 0.1 (K/uL)    Physical Exam  Constitutional: She is oriented to person, place, and time. She appears well-developed and well-nourished.   HENT:  Head: Normocephalic and atraumatic.  Wears full set dentures.  Eyes: Pupils are equal, round, and reactive to light.  Neck: Normal range of motion. Neck supple.  Cardiovascular: Normal rate and regular rhythm. Exam reveals no gallop and no friction rub.  No murmur heard.  Pulmonary/Chest: Effort normal and breath sounds normal.  Abdominal: Soft.  Bowel sounds are normal.  Musculoskeletal:  Right Achilles tight.  Neurological: She is alert and oriented to person, place, and time.  Follows commands without difficulty. Speech clear. Mild right pronator drift. RLE weakness distally3-/5 more than proximal (4/5). Diminished sensation 1/2 to pp and lt right leg. RUE 5/5 with minimal sensory deficit seen. Mild right facial weakness with no other gross CN abnormalities seen although she does squint a bit when tracking object (denies blurred or double vision however). Cognitively appropriate.  Skin: Skin is warm and dry.  Psychiatric: Judgment and thought content normal.  A bit withdrawn but cooperative.  Assessment/Plan: 1. 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 Physiatrist is providing close team supervision and 24 hour management of active medical problems listed below. Physiatrist and rehab team continue to assess barriers to discharge/monitor patient progress toward functional and medical goals. Mobility: Bed Mobility Bed Mobility: Yes Supine to Sit: 5: Supervision;HOB flat Supine to Sit Details (indicate cue type and reason): Pt relies heavily on UEs to pull up to sit, but does so safely.  Transfers Transfers: Yes Sit to Stand: 4: Min assist Sit to Stand Details (indicate cue type and reason): Definite use of bil UEs with tendancy to lean L, decreased R LE engagement Stand to Sit: 4: Min assist;With upper extremity assist;With armrests;To bed;To chair/3-in-1 Stand to Sit Details: Pt unable to recall cues for hand placement between trials Stand Pivot Transfers: 3: Mod assist Stand Pivot Transfer Details (indicate cue type and reason): Cues for sequencing and  foot placement. Pt required Mod A for safe turning from [facility] to bed due to fatigue Ambulation/Gait Ambulation/Gait Assistance: 2: Max  assist Ambulation/Gait Assistance Details (indicate cue type and reason): Pt required Max A for steadying wihtout device, tending to lean to R and unable to accurately place RLE. With RW, pt needed cues for RW proximity to self and to center as well as R heel strike . Pt unable to maintain straight path due to focsuing on RLE control.  Ambulation Distance (Feet): 10 Feet Assistive device: None Gait Pattern: Right hip hike;Right circumduction;Step-through pattern;Decreased stride length;Right steppage;Lateral trunk lean to right Gait velocity: 1.44ft/sec Stairs: Yes Stairs Assistance: 2: Max Estate agent Assistance Details (indicate cue type and reason): Pt unable to perform correct gait pattern even while stating it. A needed for steadying especially as she is unable to clear step with RLE Stair Management Technique: Two rails;Step to pattern;Forwards Number of Stairs: 5  Height of Stairs: 6  Corporate treasurer: Yes Wheelchair Assistance: 3: Mod Education officer, museum: Both upper extremities Wheelchair Parts Management: Supervision/cueing Distance: 50  ADL:    Cognition: Cognition Overall Cognitive Status: Impaired at baseline Arousal/Alertness: Awake/alert Orientation Level: Oriented X4 Attention: Sustained (needed min verbal cues for attention in quiet room ) Focused Attention: Appears intact Sustained Attention: Appears intact Sustained Attention Impairment: Verbal basic;Verbal complex;Functional basic;Functional complex Selective Attention: Appears intact Memory: Impaired Memory Impairment: Decreased recall of new information Awareness: Impaired Awareness Impairment: Anticipatory impairment (observed ambulating in room, unattended prior to treatment.) Problem Solving: Impaired Problem Solving Impairment: Verbal basic;Verbal complex;Functional basic;Functional complex (also difficulty with thought organization/reasoning tasks ) Executive Function:  Sequencing;Organizing;Self Monitoring Reasoning: Impaired Reasoning Impairment: Verbal complex;Verbal basic Sequencing: Appears intact (verbally assessed) Organizing: Appears intact Decision Making: Appears intact Initiating: Appears intact Self Monitoring: Impaired Self Monitoring Impairment: Verbal basic;Verbal complex;Functional basic Behaviors: Impulsive Safety/Judgment: Impaired Comments:  (observed ambulating w/o assistance (family present)) Cognition Arousal/Alertness: Awake/alert Orientation Level: Oriented X4  Medical Problem List and Plan:  1. DVT Prophylaxis/Anticoagulation: Lovenox.  2. Pain Management: Denies pain at present  3. Mood: Offer ego support. Educate patient about importance of medication management and compliance past discharge. Pt was quite independent prior to arrival and was encouraged to use her energy in a positive manner in rehab.  4.HTN: Monitor with bid checks. Continues to be labile. Will further adjust medications as indicated.  5. Trivial PFO: Need to check BLE dopplers to rule out DVT. If positive will need coumadin for 3 months then aspirin alone  6. Nicotine abuse: Nicotine patch daily. Reiterate need to quit smoking.  7. Dyslipidemia: On crestor daily.  8.  Trichomonas UTI treated with flagyll 9.  CVA prophyllaxis ASA  LOS (Days) 2 A FACE TO FACE EVALUATION WAS PERFORMED  Marquette Blodgett E 03/09/2011, 9:08 AM

## 2011-03-09 NOTE — Progress Notes (Signed)
Physical Therapy Session Note  Patient Details  Name: Katelyn [name] MRN: [id number] Date of Birth: Jan 06, 1951  Today's Date: 03/09/2011 Time: 1105-1200 Time Calculation (min): 55 min  Precautions: Precautions Precautions: Fall Precaution Comments: safety awareness, attention to tasks Required Braces or Orthoses: No Restrictions Weight Bearing Restrictions: No  Short Term Goals: PT Short Term Goal 1: Sit<>Stand Supervision PT Short Term Goal 1 - Progress: Progressing toward goal PT Short Term Goal 2: Gait x150' with RW supervision PT Short Term Goal 2 - Progress: Progressing toward goal PT Short Term Goal 3: Up/Down 5 stairs with 1 rail with Min A PT Short Term Goal 3 - Progress: Progressing toward goal PT Short Term Goal 4: DGI Score>19 to decrease falls risk PT Short Term Goal 4 - Progress: Progressing toward goal  Skilled Therapeutic Interventions/Progress Updates:    NMR for grading and isolation of LE movement to aid mobility; pt instructed to perform open change alphabet tracing with LE in room; gait training with RW, car transfer with RW and S cues to bring RLE into car and for safety;  Pt educated on stroke prevention and causes during rest breaks  General Chart Reviewed: Yes Family/Caregiver Present: No Vital Signs Therapy Vitals Pulse Rate: 96  (with activity) Pain Pain Assessment Pain Assessment: No/denies pain Mobility Transfers Sit to Stand: 5: Supervision;From chair/3-in-1;With armrests Sit to Stand Details: Verbal cues for safe use of DME/AE Stand to Sit: 5: Supervision;With upper extremity assist Stand to Sit Details (indicate cue type and reason): Verbal cues for safe use of DME/AE Locomotion  Ambulation Ambulation/Gait Assistance: 4: Min assist Assistive device: Rolling walker Ambulation/Gait Assistance Details: Verbal cues for safe use of DME/AE;Verbal cues for technique;Verbal cues for precautions/safety;Verbal cues for gait  pattern Ambulation/Gait Assistance Details (indicate cue type and reason): intermittent toe drag R, or foot slap, initially difficulty with RLE swing overall but improved, pt tends to look down, increase LOB with head turns and turns, staggers, instruction for safe RW use and cue for heel strike R Gait Gait velocity: decreased Stairs / Additional Locomotion Stairs: Yes Stairs Assistance: 4: Min assist Stairs Assistance Details: Verbal cues for technique;Manual facilitation for weight shifting Stair Management Technique: Two rails;Step to pattern;Forwards Number of Stairs: 4  Height of Stairs: 5  Wheelchair Mobility Distance: 160      Balance  min A for standing exercise even with BUE support d/t moving trunk abnormally    Other Treatments Treatments Neuromuscular Facilitation: Lower Extremity;Activity to increase coordination;Activity to increase motor control;Activity to increase timing and sequencing;Activity to increase grading;Limitations Neuromuscular Facilitation Limitations: decreased quality of movment but R worse than LLE, performed standing exercises with 3 # with cues and assist for posture and balance and technique  Therapy/Group: Individual Therapy  Michaelene Song 03/09/2011, 12:19 PM

## 2011-03-10 NOTE — Progress Notes (Signed)
Occupational Therapy Session Note  Patient Details  Name: Katelyn [name] MRN: [id number] Date of Birth: 1951-02-01  Today's Date: 03/10/2011  Precautions: Precautions Precautions: Fall Precaution Comments: safety awareness, attention to tasks Required Braces or Orthoses: No Restrictions Weight Bearing Restrictions: No  Short Term Goals: OT Short Term Goal 1: Patient will complete bathing, in tub, using DME prn, with min assist OT Short Term Goal 1 - Progress: Progressing toward goal OT Short Term Goal 2: Patient will perform light housekeeping using appropriate gait aid and supervision for safety OT Short Term Goal 2 - Progress: Progressing toward goal OT Short Term Goal 3: Patient will complete simple meal prep with supervision for safety OT Short Term Goal 3 - Progress: Progressing toward goal OT Short Term Goal 4: Patient will complete lower body dressing, using AE prn, with supervision OT Short Term Goal 4 - Progress: Progressing toward goal  Treatment Session 1: Time: 4098-1191 Time Calculation (min): 58 min Pt had already completed bathing and dressing prior to session.  Engaged in therapeutic exercise and therapeutic activities with focus on standing balance, dynamic balance with gathering items from floor, and activity tolerance.  Pt with 1 significant LOB towards Rt requiring assist to right self.  Engaged in activity standing on dynamic surface to increase RLE engagement in standing.  Pt with no c/o pain this session.  Treatment Session 2: Time: 1400-1430 Time Calculation (min): 30 min Engaged in therapeutic exercise in standing at mirror with focus on BUE ROM in PNF patterns and challenging balance with leaning to Lt and Rt while on dynamic surface.  UE exercise with balance component with tossing ball to mirror and reactions to catch while maintaining upright balance.  Self-care with toileting and toilet transfer with distant supervision.  Therapy/Group: Individual  Therapy  Leonette Monarch 03/10/2011, 1:10 PM

## 2011-03-10 NOTE — Progress Notes (Signed)
[facility] Individual Statement of Services  Patient Name:  Katelyn [name]  Date:  03/10/2011  Welcome to the [facility].  Our goal is to provide you with an individualized program based on your diagnosis and situation, designed to meet your specific needs.  With this comprehensive rehabilitation program, you will be expected to participate in at least 3 hours of rehabilitation therapies Monday-Friday, with modified therapy programming on the weekends.  Your rehabilitation program will include the following services:  Physical Therapy (PT), Occupational Therapy (OT), Speech Therapy (ST), 24 hour per day rehabilitation nursing, Therapeutic Recreaction (TR), Case Management (RN and Child psychotherapist), Rehabilitation Medicine, Nutrition Services and Pharmacy Services  Weekly team conferences will be held on Wednesdays to discuss your progress.  Your RN Case Designer, television/film set will talk with you frequently to get your input and to update you on team discussions.  Team conferences with you and your family in attendance may also be held.  Expected length of stay:7-10 days  Overall anticipated outcome:Supervision - Modified independent  Depending on your progress and recovery, your program may change.  Your RN Case Estate agent will coordinate services and will keep you informed of any changes.  Your RN Sports coach and SW names and contact numbers are listed  below.  The following services may also be recommended but are not provided by the [facility]:   Driving Evaluations  Home Health Rehabiltiation Services  Outpatient Rehabilitatation [facility]  Vocational Rehabilitation   Arrangements will be made to provide these services after discharge if needed.  Arrangements include referral to agencies that provide these services.  Your insurance has been verified to be:  [place] Your primary doctor is:  Dr Benetta Spar  [place]  Pertinent information will be shared with your doctor and your insurance company.  Case Manager: Lutricia Horsfall, [facility] [phone]  Social Worker:  Dossie Der, [place] [phone]  Information discussed with and copy given to patient by: Meryl Dare, 03/10/2011

## 2011-03-10 NOTE — Progress Notes (Addendum)
Physical Therapy Note  Patient Details  Name: Katelyn [name] MRN: [id number] Date of Birth: May 17, 1950 Today's Date: 03/10/2011  4098-1191 (40 minutes) individual treatment session Pain- no complaint of pain Focus of treatment: Neuro re-ed Rt LE focusing on concentric/eccentric hip flexion, abduction in supine and standing hip abduction in weight bearing Treatment: Gait 150 feet RW min assist with adducted Rt LE in stance; transfers ; Stand /pivot SBA for safety; RT LE SLR supine and sidelying hip abduction focusing on concentric/eccentric control; standing up/down step forwards/sideways Gait to room (120 feet) pt required mod cues for walker safety and one loss of balance pushing walker to far in front of her and cues for safe use of walker during transfer to recliner (pt left walker and attempted to walk without AD. Antonia Culbertson,JIM 03/10/2011, 9:56 AM

## 2011-03-10 NOTE — Progress Notes (Signed)
Progress Notes  Speech Language Pathology Therapy Note  Patient Details  Name: Katelyn [name] MRN: [id number] Date of Birth: 1950/11/20  Today's Date: 03/10/2011 Time: 4010-2725 Time Calculation (min): 60 min  Precautions: Precautions Precautions: Fall Precaution Comments: safety awareness, attention to tasks Required Braces or Orthoses: No Restrictions Weight Bearing Restrictions: No  Short Term Goals: set 03/08/11 1. Patient will participate in functional problem solving tasks with minimal verbal and tactile cues 2. Patient will demonstrate elective atention to task in mildly distracting environment for 10 minutes with moderate verbal cues from therapist 3. Patient will demonstrate intellectual awareness of one physical and one cognitive deficit with minimal verbal, question cues from therapist  Skilled Therapeutic Interventions/Progress Updates: Session focused on identifying changes post CVA as well as problem solving and planning for changes to accommodate for deficits due to CVA.  Patient verbalized physical deficit but unable to verbalize awareness of cognitive changes.  SLP facilitated session with moderate assist semantic cues recall medications (name and function), hemi dressing techniques and techniques for mobility on stairs and required minimal assist semantic cues to verbally problem solve for changes/acomidations needed upon discharge to home.  Daughter present for session and verbalized that her mom's not remembering things as well as she use to.  Patient progressing; continue current plan of care.   Pain Pain Assessment Pain Assessment: No/denies pain Pain Score: 0-No pain  Therapy/Group: Individual Therapy  Katelyn [name]., CCC-SLP [phone] Katelyn [name] 03/10/2011 10:33 AM

## 2011-03-10 NOTE — Progress Notes (Signed)
Patient refused nicotine patch 2 nights in a row, states " I don't need it, I quit cold [place] after having this stroke." Cont plan of care.

## 2011-03-10 NOTE — Progress Notes (Signed)
Patient is alert and oriented x 3. Needs cues for safety and reminders to call for assist. Bed alarm and quick release in use. Continent bowel/bladder. No BM today. No complaints of pain. Daughters in room. Min assist stand pivot with rolling walker. Will continue with plan of care. Hedy Camara

## 2011-03-10 NOTE — Progress Notes (Signed)
Patient ID: Katelyn [name], female   DOB: 1950-06-12, 61 y.o.   MRN: [id number] Subjective/Complaints: Slept ok.  Feels good this am Review of Systems  Neurological: Positive for focal weakness.  All other systems reviewed and are negative.  1/14 Objective: Vital Signs: Blood pressure 133/89, pulse 78, temperature 98.6 F (37 C), temperature source Oral, resp. rate 18, height 4' (1.219 m), weight 50.3 kg (110 lb 14.3 oz), SpO2 97.00%. No results found. Results for orders placed during the hospital encounter of 03/07/11 (from the past 72 hour(s))  URINALYSIS, ROUTINE W REFLEX MICROSCOPIC     Status: Abnormal   Collection Time   03/07/11  4:18 PM      Component Value Range Comment   Color, Urine YELLOW  YELLOW     APPearance CLOUDY (*) CLEAR     Specific Gravity, Urine 1.011  1.005 - 1.030     pH 5.5  5.0 - 8.0     Glucose, UA NEGATIVE  NEGATIVE (mg/dL)    Hgb urine dipstick TRACE (*) NEGATIVE     Bilirubin Urine NEGATIVE  NEGATIVE     Ketones, ur NEGATIVE  NEGATIVE (mg/dL)    Protein, ur NEGATIVE  NEGATIVE (mg/dL)    Urobilinogen, UA 1.0  0.0 - 1.0 (mg/dL)    Nitrite NEGATIVE  NEGATIVE     Leukocytes, UA LARGE (*) NEGATIVE    URINE CULTURE     Status: Normal   Collection Time   03/07/11  4:18 PM      Component Value Range Comment   Specimen Description URINE, CLEAN CATCH      Special Requests NONE      Setup Time [id number]      Colony Count 6,000 COLONIES/ML      Culture INSIGNIFICANT GROWTH      Report Status 03/08/2011 FINAL     URINE MICROSCOPIC-ADD ON     Status: Abnormal   Collection Time   03/07/11  4:18 PM      Component Value Range Comment   Squamous Epithelial / LPF MANY (*) RARE     WBC, UA 21-50  <3 (WBC/hpf)    RBC / HPF 0-2  <3 (RBC/hpf)    Bacteria, UA FEW (*) RARE     Urine-Other TRICHOMONAS PRESENT     CBC     Status: Normal   Collection Time   03/08/11  6:13 AM      Component Value Range Comment   WBC 9.6  4.0 - 10.5 (K/uL)    RBC 4.31  3.87 -  5.11 (MIL/uL)    Hemoglobin 13.1  12.0 - 15.0 (g/dL)    HCT 27.2  53.6 - 64.4 (%)    MCV 91.0  78.0 - 100.0 (fL)    MCH 30.4  26.0 - 34.0 (pg)    MCHC 33.4  30.0 - 36.0 (g/dL)    RDW 03.4  74.2 - 59.5 (%)    Platelets 229  150 - 400 (K/uL)   COMPREHENSIVE METABOLIC PANEL     Status: Abnormal   Collection Time   03/08/11  6:13 AM      Component Value Range Comment   Sodium 138  135 - 145 (mEq/L)    Potassium 4.3  3.5 - 5.1 (mEq/L)    Chloride 102  96 - 112 (mEq/L)    CO2 26  19 - 32 (mEq/L)    Glucose, Bld 93  70 - 99 (mg/dL)    BUN 25 (*) 6 - 23 (mg/dL)  Creatinine, Ser 0.92  0.50 - 1.10 (mg/dL)    Calcium 47.8  8.4 - 10.5 (mg/dL)    Total Protein 8.0  6.0 - 8.3 (g/dL)    Albumin 4.2  3.5 - 5.2 (g/dL)    AST 28  0 - 37 (U/L)    ALT 15  0 - 35 (U/L)    Alkaline Phosphatase 93  39 - 117 (U/L)    Total Bilirubin 0.4  0.3 - 1.2 (mg/dL)    GFR calc non Af Amer 66 (*) >90 (mL/min)    GFR calc Af Amer 76 (*) >90 (mL/min)   DIFFERENTIAL     Status: Normal   Collection Time   03/08/11  6:13 AM      Component Value Range Comment   Neutrophils Relative 50  43 - 77 (%)    Neutro Abs 4.8  1.7 - 7.7 (K/uL)    Lymphocytes Relative 40  12 - 46 (%)    Lymphs Abs 3.8  0.7 - 4.0 (K/uL)    Monocytes Relative 8  3 - 12 (%)    Monocytes Absolute 0.8  0.1 - 1.0 (K/uL)    Eosinophils Relative 2  0 - 5 (%)    Eosinophils Absolute 0.2  0.0 - 0.7 (K/uL)    Basophils Relative 0  0 - 1 (%)    Basophils Absolute 0.0  0.0 - 0.1 (K/uL)    Physical Exam  Constitutional: She is oriented to person, place, and time. She appears well-developed and well-nourished.   HENT:  Head: Normocephalic and atraumatic.  Wears full set dentures.  Eyes: Pupils are equal, round, and reactive to light.  Neck: Normal range of motion. Neck supple.  Cardiovascular: Normal rate and regular rhythm. Exam reveals no gallop and no friction rub.  No murmur heard.  Pulmonary/Chest: Effort normal and breath sounds normal.    Abdominal: Soft. Bowel sounds are normal.  Musculoskeletal:  Right Achilles tight.  Neurological: She is alert and oriented to person, place, and time.  Follows commands without difficulty. Speech clear. Mild right pronator drift. RLE weakness distally3-/5 more than proximal (4/5). Diminished sensation 1/2 to pp and lt right leg. RUE 5/5 with minimal sensory deficit seen. Mild right facial weakness with no other gross CN abnormalities seen although she does squint a bit when tracking object (denies blurred or double vision however). Cognitively appropriate.  Skin: Skin is warm and dry.  Psychiatric: Judgment and thought content normal.  A bit withdrawn but cooperative.  1/14 Assessment/Plan: 1. 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 Physiatrist is providing close team supervision and 24 hour management of active medical problems listed below. Physiatrist and rehab team continue to assess barriers to discharge/monitor patient progress toward functional and medical goals. Mobility: Bed Mobility Bed Mobility: Yes Supine to Sit: 5: Supervision;HOB flat Supine to Sit Details (indicate cue type and reason): Pt relies heavily on UEs to pull up to sit, but does so safely.  Transfers Transfers: Yes Sit to Stand: 5: Supervision;From chair/3-in-1;With armrests Sit to Stand Details (indicate cue type and reason): Definite use of bil UEs with tendancy to lean L, decreased R LE engagement Stand to Sit: 5: Supervision;With upper extremity assist Stand to Sit Details: Pt unable to recall cues for hand placement between trials Stand Pivot Transfers: 3: Mod assist Stand Pivot Transfer Details (indicate cue type and reason): Cues for sequencing  and foot placement. Pt required Mod A for safe turning from [facility] to bed due to fatigue Ambulation/Gait Ambulation/Gait  Assistance: 4: Min assist Ambulation/Gait Assistance Details (indicate cue type and reason): intermittent toe drag R, or foot slap, initially difficulty with RLE swing overall but improved, pt tends to look down, increase LOB with head turns and turns, staggers, instruction for safe RW use and cue for heel strike R Ambulation Distance (Feet): 10 Feet Assistive device: Rolling walker Gait Pattern: Right hip hike;Right circumduction;Step-through pattern;Decreased stride length;Right steppage;Lateral trunk lean to right Gait velocity: decreased Stairs: Yes Stairs Assistance: 4: Min assist Stairs Assistance Details (indicate cue type and reason): Pt unable to perform correct gait pattern even while stating it. A needed for steadying especially as she is unable to clear step with RLE Stair Management Technique: Two rails;Step to pattern;Forwards Number of Stairs: 4  Height of Stairs: 5  Corporate treasurer: Yes Wheelchair Assistance: 3: Mod Education officer, museum: Both upper extremities Wheelchair Parts Management: Supervision/cueing Distance: 160  ADL:    Cognition: Cognition Overall Cognitive Status: Impaired at baseline Arousal/Alertness: Awake/alert Orientation Level: Oriented X4 Attention: Sustained (needed min verbal cues for attention in quiet room ) Focused Attention: Appears intact Sustained Attention: Appears intact Sustained Attention Impairment: Verbal basic;Verbal complex;Functional basic;Functional complex Selective Attention: Appears intact Memory: Impaired Memory Impairment: Decreased recall of new information Awareness: Impaired Awareness Impairment: Anticipatory impairment (observed ambulating in room, unattended prior to treatment.) Problem Solving: Impaired Problem Solving Impairment: Verbal basic;Verbal complex;Functional basic;Functional complex (also difficulty with thought organization/reasoning tasks ) Executive Function:  Sequencing;Organizing;Self Monitoring Reasoning: Impaired Reasoning Impairment: Verbal complex;Verbal basic Sequencing: Appears intact (verbally assessed) Organizing: Appears intact Decision Making: Appears intact Initiating: Appears intact Self Monitoring: Impaired Self Monitoring Impairment: Verbal basic;Verbal complex;Functional basic Behaviors: Impulsive Safety/Judgment: Impaired Comments:  (observed ambulating w/o assistance (family present)) Cognition Arousal/Alertness: Awake/alert Orientation Level: Oriented X4  Medical Problem List and Plan:  1. DVT Prophylaxis/Anticoagulation: Lovenox.  2. Pain Management: Denies pain at present  3. Mood: Offer ego support. Educate patient about importance of medication management and compliance past discharge. Pt was quite independent prior to arrival and was encouraged to use her energy in a positive manner in rehab.  4.HTN: Monitor with bid checks. Continues to be labile. Will further adjust medications as indicated.  5. Trivial PFO: Need to check BLE dopplers to rule out DVT. If positive will need coumadin for 3 months then aspirin alone  6. Nicotine abuse: Nicotine patch daily. Reiterate need to quit smoking.  7. Dyslipidemia: On crestor daily.  8.  Trichomonas UTI treated with flagyll 9.  CVA prophyllaxis ASA  LOS (Days) 3 A FACE TO FACE EVALUATION WAS PERFORMED  KIRSTEINS,ANDREW E 03/10/2011, 8:44 AM

## 2011-03-10 NOTE — Progress Notes (Signed)
Per State Regulation 482.30 This chart was reviewed for medical necessity with respect to the patient's Admission/Duration of stay. Pt participating in therapies with steady progress. Working on balance.Treating UTI with Flagyll.  Meryl Dare                 Nurse Care Manager            Next Review Date: 03/14/11

## 2011-03-10 NOTE — Progress Notes (Signed)
Social Work Assessment and Plan Assessment and Plan  Patient Name: Katelyn [name]  Today's Date: 03/10/2011  Problem List:  Patient Active Problem List  Diagnoses  . HYPERLIPIDEMIA  . NEUROPATHY, IDIOPATHIC  . HYPERTENSION  . Acute bronchitis  . Malignant hypertensive urgency  . CVA (cerebral infarction)  . Ataxia following cerebral infarction  . CKD (chronic kidney disease) stage 3, GFR 30-59 ml/min  . Diastolic dysfunction  . Tobacco abuse  . Constipation    Past Medical History:  Past Medical History  Diagnosis Date  . Hypertension   . Hypercholesteremia   . Shortness of breath   . Blood transfusion     Past Surgical History:  Past Surgical History  Procedure Date  . Abdominal hysterectomy   . Tee without cardioversion 03/06/2011    Procedure: TRANSESOPHAGEAL ECHOCARDIOGRAM (TEE);  Surgeon: Dolores Patty, MD;  Location: [facility] ENDOSCOPY;  Service: Cardiovascular;  Laterality: N/A;    Discharge Planning  Discharge Planning Support Systems: Spouse/significant other;Children;Church/faith community Assistance Needed: May require assist at discharge-supervision level Case Management Consult Needed: Yes (Comment) (RNCM-already following)  Social/Family/Support Systems Social/Family/Support Systems Anticipated Caregiver's Contact Information: [name]  Employment Status Employment Status Employment Status: Disabled Fish farm manager Issues: No issues Guardian/Conservator: None  Abuse/Neglect    Emotional Status Emotional Status Pt's affect, behavior adn adjustment status: Pt is motivated to improve and is encouraged by the progress she has made thus far.  She reports: " This really scared me." Recent Psychosocial Issues: Other health issues Pyschiatric History: No history- deferred beck Depression Screen due to coping appropriately.  Discussed her tobacco use and she realizes she needs to quit or cut down.    Patient/Family  Perceptions, Expectations & Goals Pt/Family Perceptions, Expectations and Goals Pt/Family understanding of illness & functional limitations: Pt and daughter are able to explain her stroke and deficits.  She is encouaraged by her progress and hope she is not here long. Premorbid pt/family roles/activities: Mother, Retiree, [employer], etc Anticipated changes in roles/activities/participation: Plans to resume Pt/family expectations/goals: Pt states: " I want to be able to do for myself again." Daughter reports: " She is doing good." Both optimsitic regarding pt's recovery.  Community Resources [employer] Agencies: None Premorbid Home Care/DME Agencies: None Transportation available at discharge: Family Resource referrals recommended: Support group (specify) (CVA Support Group)  Discharge Visual merchandiser Resources: Media planner (specify) Investment banker, operational) Financial Resources: SSD Financial Screen Referred: No Living Expenses: Lives with family Money Management: Patient Home Management: Pt does home management Patient/Family Preliminary Plans: Retrun home with little assist from family.  Pt has always been very independent and wants to remain so.  Daughter here observing and very supportive. Pt realizes she needs to change some of her habits.  Clinical Impression:  Pleasant female sitting up in chair tired from all her mornings therapies.  Daughter here to observe and be supportive.  Pt is encouraged by her recovery thus far and is hopeful  She will continue to progress.      Lucy Chris 03/10/2011

## 2011-03-11 DIAGNOSIS — Z5189 Encounter for other specified aftercare: Secondary | ICD-10-CM

## 2011-03-11 DIAGNOSIS — I6330 Cerebral infarction due to thrombosis of unspecified cerebral artery: Secondary | ICD-10-CM

## 2011-03-11 NOTE — Progress Notes (Signed)
Progress Notes  Speech Language Pathology Therapy Note  Patient Details  Name: Katelyn [name] MRN: [id number] Date of Birth: 03/07/1950  Today's Date: 03/11/2011 Time: 1005-1045 Time Calculation (min): 40 min  Precautions: Precautions Precautions: Fall Precaution Comments: safety awareness, attention to tasks Required Braces or Orthoses: No Restrictions Weight Bearing Restrictions: No  Short Term Goals: set 03/08/11  1. Patient will participate in functional problem solving tasks with minimal verbal and tactile cues  2. Patient will demonstrate elective atention to task in mildly distracting environment for 10 minutes with moderate verbal cues from therapist  3. Patient will demonstrate intellectual awareness of one physical and one cognitive deficit with minimal verbal, question cues from therapist  Skilled Therapeutic Interventions/Progress Updates: Session focused on complex problem solving with medication management and patient required moderate assist semantic cues to recall and utilize list of names and functions of medications with recall of previous medications and acknowledgement of known changes but no recall of new medications or attempt to use compensatory strategies to recall.  SLP also facilitated session with minimal assist semantic cues to verbally problem solve with medication management.     Pain Pain Assessment Pain Assessment: No/denies pain Pain Score: 0-No pain  Oral/Motor: Oral Motor/Sensory Function Overall Oral Motor/Sensory Function: Appears within functional limits for tasks assessed Motor Speech Overall Motor Speech: Appears within functional limits for tasks assessed Comprehension: Auditory Comprehension Overall Auditory Comprehension: Impaired Yes/No Questions: Within Functional Limits;Impaired Basic Immediate Environment Questions: 75-100% accurate Complex Questions: 75-100% accurate (90%) Paragraph Comprehension (via yes/no questions): 76-100%  accurate (100% with overt hestiation in responses) Commands: Within Functional Limits;Impaired Complex Commands: 75-100% accurate Conversation: Complex (appears baseline) Interfering Components: Processing speed EffectiveTechniques: Extra processing time Visual Recognition/Discrimination Discrimination: Within Function Limits Reading Comprehension Reading Status: Within funtional limits Expression: Expression Primary Mode of Expression: Verbal Verbal Expression Overall Verbal Expression: Appears within functional limits for tasks assessed Initiation: No impairment Level of Generative/Spontaneous Verbalization: Conversation Repetition: No impairment Naming: No impairment Confrontation: Within functional limits Divergent:  (7 animals in 60", max. decreased) Pragmatics: No impairment Interfering Components: Attention Written Expression Dominant Hand: Right (WFL for sentence level ) Written Expression: Within Functional Limits  Therapy/Group: Individual Therapy  Charlane Ferretti., CCC-SLP [phone] Danella Philson 03/11/2011 12:17 PM

## 2011-03-11 NOTE — Progress Notes (Signed)
Patient ID: Katelyn [name], female   DOB: 25-Jan-1951, 61 y.o.   MRN: [id number] Subjective/Complaints: Slept ok.  Feels good this am Review of Systems  Neurological: Positive for focal weakness.  All other systems reviewed and are negative.  1/14 Objective: Vital Signs: Blood pressure 126/88, pulse 56, temperature 97.4 F (36.3 C), temperature source Oral, resp. rate 19, height 4' (1.219 m), weight 50.3 kg (110 lb 14.3 oz), SpO2 96.00%. No results found. No results found for this or any previous visit (from the past 72 hour(s)). Physical Exam  Constitutional: She is oriented to person, place, and time. She appears well-developed and well-nourished.   HENT:  Head: Normocephalic and atraumatic.  Wears full set dentures.  Eyes: Pupils are equal, round, and reactive to light.  Neck: Normal range of motion. Neck supple.  Cardiovascular: Normal rate and regular rhythm. Exam reveals no gallop and no friction rub.  No murmur heard.  Pulmonary/Chest: Effort normal and breath sounds normal.  Abdominal: Soft. Bowel sounds are normal.  Musculoskeletal:  Right Achilles tight.  Neurological: She is alert and oriented to person, place, and time.  Follows commands without difficulty. Speech clear. Mild right pronator drift. RLE weakness distally3-/5 more than proximal (4/5). Diminished sensation 1/2 to pp and lt right leg. RUE 5/5 with minimal sensory deficit seen. Mild right facial weakness with no other gross CN abnormalities seen although she does squint a bit when tracking object (denies blurred or double vision however). Cognitively appropriate.  Skin: Skin is warm and dry.  Psychiatric: Judgment and thought content normal.   1/15 Assessment/Plan: 1. 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 Physiatrist is providing close team supervision and 24 hour  management of active medical problems listed below. Physiatrist and rehab team continue to assess barriers to discharge/monitor patient progress toward functional and medical goals. Mobility: Bed Mobility Bed Mobility: Yes Supine to Sit: 5: Supervision;HOB flat Supine to Sit Details (indicate cue type and reason): Pt relies heavily on UEs to pull up to sit, but does so safely.  Transfers Transfers: Yes Sit to Stand: 5: Supervision;From chair/3-in-1;With armrests Sit to Stand Details (indicate cue type and reason): Definite use of bil UEs with tendancy to lean L, decreased R LE engagement Stand to Sit: 5: Supervision;With upper extremity assist Stand to Sit Details: Pt unable to recall cues for hand placement between trials Stand Pivot Transfers: 3: Mod assist Stand Pivot Transfer Details (indicate cue type and reason): Cues for sequencing and foot placement. Pt required Mod A for safe turning from [facility] to bed due to fatigue Ambulation/Gait Ambulation/Gait Assistance: 4: Min assist Ambulation/Gait Assistance Details (indicate cue type and reason): intermittent toe drag R, or foot slap, initially difficulty with RLE swing overall but improved, pt tends to look down, increase LOB with head turns and turns, staggers, instruction for safe RW use and cue for heel strike R Ambulation Distance (Feet): 10 Feet Assistive device: Rolling walker Gait Pattern: Right hip hike;Right circumduction;Step-through pattern;Decreased stride length;Right steppage;Lateral trunk lean to right Gait velocity: decreased Stairs: Yes Stairs Assistance: 4: Min assist Stairs Assistance Details (indicate cue type and reason): Pt unable to perform correct gait pattern even while stating it. A needed for steadying especially as she is unable to clear step with RLE Stair Management Technique: Two rails;Step to pattern;Forwards Number of Stairs: 4  Height of Stairs: 5  Wheelchair Mobility Wheelchair Mobility: Yes Wheelchair  Assistance: 3: Mod Education officer, museum: Both upper extremities Wheelchair Parts Management: Supervision/cueing Distance: 160  ADL:    Cognition: Cognition Overall Cognitive Status: Impaired at baseline Arousal/Alertness: Awake/alert Orientation Level: Oriented to person;Oriented to place Attention: Sustained (needed min verbal cues for attention in quiet room ) Focused Attention: Appears intact Sustained Attention: Appears intact Sustained Attention Impairment: Verbal basic;Verbal complex;Functional basic;Functional complex Selective Attention: Appears intact Memory: Impaired Memory Impairment: Decreased recall of new information Awareness: Impaired Awareness Impairment: Anticipatory impairment (observed ambulating in room, unattended prior to treatment.) Problem Solving: Impaired Problem Solving Impairment: Verbal basic;Verbal complex;Functional basic;Functional complex (also difficulty with thought organization/reasoning tasks ) Executive Function: Sequencing;Organizing;Self Monitoring Reasoning: Impaired Reasoning Impairment: Verbal complex;Verbal basic Sequencing: Appears intact (verbally assessed) Organizing: Appears intact Decision Making: Appears intact Initiating: Appears intact Self Monitoring: Impaired Self Monitoring Impairment: Verbal basic;Verbal complex;Functional basic Behaviors: Impulsive Safety/Judgment: Impaired Comments:  (observed ambulating w/o assistance (family present)) Cognition Arousal/Alertness: Awake/alert Orientation Level: Oriented to person;Oriented to place  Medical Problem List and Plan:  1. DVT Prophylaxis/Anticoagulation: Lovenox.  2. Pain Management: Denies pain at present  3. Mood: Offer ego support. Educate patient about importance of medication management and compliance past discharge. 4.HTN: Monitor with bid checks. Continues to be labile. Will further adjust medications as indicated.  5. Trivial PFO:  BLE dopplers negative                                                                                                      6. Nicotine abuse: Nicotine patch daily. Reiterate need to quit smoking.  7. Dyslipidemia: On crestor daily.  8.  Trichomonas UTI treated with flagyll 9.  CVA prophyllaxis ASA  LOS (Days) 4 A FACE TO FACE EVALUATION WAS PERFORMED  Timiyah Romito E 03/11/2011, 8:44 AM

## 2011-03-11 NOTE — Progress Notes (Addendum)
Physical Therapy Session Note  Patient Details  Name: Katelyn [name] MRN: [id number] Date of Birth: August 16, 1950  Today's Date: 03/11/2011 Time: 0910-1005 Time Calculation (min): 55 min  Precautions: Precautions Precautions: Fall Precaution Comments: safety awareness, attention to tasks Required Braces or Orthoses: No Restrictions Weight Bearing Restrictions: No  Short Term Goals: PT Short Term Goal 1: Sit<>Stand Supervision PT Short Term Goal 1 - Progress: Progressing toward goal PT Short Term Goal 2: Gait x150' with RW supervision PT Short Term Goal 2 - Progress: Progressing toward goal PT Short Term Goal 3: Up/Down 5 stairs with 1 rail with Min A PT Short Term Goal 3 - Progress: Progressing toward goal PT Short Term Goal 4: DGI Score>19 to decrease falls risk PT Short Term Goal 4 - Progress: Progressing toward goal  Skilled Therapeutic Interventions/Progress Updates: Pt reported no pain this AM.  Therapy focused on neuromuscular re-ed, gait and mobility training.  Gait training with RW with frequent VCs for safety, hand placement, supervision to min a for balance during turns.  Gait on tile and carpet, including transporting clothes on hangars; standing balance to hang up clothes with close supervision.  Sit<> stand from furniture in ADL apt with S and VCs for safe technique.  Neuromuscular re-ed for trunk shortening/lengthening, in sitting, with legs crossed for increased weight bearing, during hip hiking x 10 bil.  Pt had LOB when overshifting R, requiring mod a, but pt improved with repetition x 10.  Closed and open chain bil hip abduction for strengthening and neuromuscular re-ed, in standing, with min A for balance with LLE wt bearing, mod A for RLE wt bearing.  RLE open chain coordinated movements to tap cones in standing, with min /mod A for balance.    Pain Assessment Pain Assessment: No/denies pain Pain Score: 0-No pain       Therapy/Group: Individual  Therapy  Anaiza Behrens 03/11/2011, 12:56 PM

## 2011-03-11 NOTE — Progress Notes (Addendum)
Pt continent bowel and bladder; reports LBM this AM; Alert and oriented x4; Memory deficits evident; did call for assist and wait for staff; alarm on, no unsafe behaviors; denies pain; Min assist sit to stand  ,steadying assist, min verbal cues OOB.  Assist with set up meal trays, appetite fair.Carlean Purl

## 2011-03-11 NOTE — Progress Notes (Signed)
Social Work Patient ID: Katelyn [name], female   DOB: 1951-02-14, 61 y.o.   MRN: [id number]  Met with pt to inform have scheduled MD appt with Dr. Barbaraann Barthel for follow up. Pt reports he had seen her years before.  Aware team conference tomorrow and is looking Forward to a target discharge date.  She hopes to go home soon.

## 2011-03-11 NOTE — Progress Notes (Signed)
Occupational Therapy Session Note  Patient Details  Name: Katelyn [name] MRN: [id number] Date of Birth: 1950-04-23  Today's Date: 03/11/2011  Precautions: Precautions Precautions: Fall Precaution Comments: safety awareness, attention to tasks Required Braces or Orthoses: No Restrictions Weight Bearing Restrictions: No  Short Term Goals: OT Short Term Goal 1: Patient will complete bathing, in tub, using DME prn, with min assist OT Short Term Goal 1 - Progress: Progressing toward goal OT Short Term Goal 2: Patient will perform light housekeeping using appropriate gait aid and supervision for safety OT Short Term Goal 2 - Progress: Progressing toward goal OT Short Term Goal 3: Patient will complete simple meal prep with supervision for safety OT Short Term Goal 3 - Progress: Progressing toward goal OT Short Term Goal 4: Patient will complete lower body dressing, using AE prn, with supervision OT Short Term Goal 4 - Progress: Progressing toward goal  Treatment Session 1: Time: 4098-1191 Time Calculation (min): 54 min Pt seen for ADL retraining at walk-in shower level.  Focus on functional mobility, dynamic standing balance, and increased independence with bathing and dressing.  Required verbal cues to use RW and keep it in front of her at all times with ambulation.  Grooming conducted in standing at sink without LOB.  Pt with no c/o pain this session  Treatment Session 2: Time: 1330-1400 Time Calculation (min): 30 min Engaged in home management/self cares in ADL apartment.  Focus on dynamic balance with advanced ADL of making bed.  Pt with use of bed to steady balance as needed and did not requires use of RW with this task.  Encouraged pt to set aside RW so as not to have it get in the way of the task, as she prefers to drag it along.  Tub bench transfer into tub/shower with close supervision.  Discussed options for bathing as pt preferred to take a bath PTA.  Discussed grab bars and  tub bench for tub/shower.  Pt with no c/o pain this session.  Therapy/Group: Individual Therapy  Leonette Monarch 03/11/2011, 8:27 AM

## 2011-03-12 MED ORDER — LISINOPRIL 5 MG PO TABS
5.0000 mg | ORAL_TABLET | Freq: Two times a day (BID) | ORAL | Status: DC
  Administered 2011-03-12 – 2011-03-14 (×4): 5 mg via ORAL
  Filled 2011-03-12 (×6): qty 1

## 2011-03-12 NOTE — Progress Notes (Signed)
Physical Therapy Session Note  Patient Details  Name: Katelyn [name] MRN: [id number] Date of Birth: 1951-01-06  Today's Date: 03/12/2011 Time: 0910-1005 Time Calculation (min): 55 min  Precautions: Precautions Precautions: Fall Precaution Comments: safety awareness, attention to tasks Required Braces or Orthoses: No Restrictions Weight Bearing Restrictions: No  Short Term Goals: PT Short Term Goal 1: Sit<>Stand Supervision PT Short Term Goal 1 - Progress: Progressing toward goal PT Short Term Goal 2: Gait x150' with RW supervision PT Short Term Goal 2 - Progress: Progressing toward goal PT Short Term Goal 3: Up/Down 5 stairs with 1 rail with Min A PT Short Term Goal 3 - Progress: Progressing toward goal PT Short Term Goal 4: DGI Score>19 to decrease falls risk PT Short Term Goal 4 - Progress: Progressing toward goal  Skilled Therapeutic Interventions/Progress Updates: Pt reported no pain. VCs and demo for safe use of RW during sit>< stand, due to pt forgetting technique from session to session.  Warm- up for bil LEs with sitting hip flexion, LAQs, ankle DF, x 10 each.  Pt demonstrated RLE motor control deficits for alternating ankle DF, improving slightly with practice.  Gait training x 186'x 2 on tile using RW with S/min A for drifting R, staying on R side of hall, increasing cadence.  On carpet in home environment, x 50' x 2 without AD, with min A, negotiating obstacles, walking backwards, with consistent min A for balance, step length.  Neuromuscular re-ed of trunk muscles for trunk shortening/lengthening with legs crossed in sitting, with S for balance, R and L wt shifting x 10 each.  Therapeutic activity for dynamic sitting balance, reaching out of base of support to R, demonstrating postural reactions in trunk, bil LEs and LUE, x 15 reps with 1 LOB to R, regained independently.  Cognitive deficits including ST memory limit her progress with safe mobility.       Pain Pain  Assessment Pain Score: 0-No pain Mobility   Locomotion  Ambulation Ambulation/Gait Assistance: 4: Min assist        Therapy/Group: Individual Therapy  Cyniah Gossard 03/12/2011, 10:12 AM

## 2011-03-12 NOTE — Progress Notes (Signed)
Patient ID: Katelyn [name], female   DOB: 03-Jul-1950, 61 y.o.   MRN: [id number] Subjective/Complaints: Slept ok.  Feels good this pm, impatient about recovery Review of Systems  Neurological: Positive for focal weakness. RLE All other systems reviewed and are negative.  1/16 Objective: Vital Signs: Blood pressure 118/80, pulse 71, temperature 97.3 F (36.3 C), temperature source Oral, resp. rate 20, height 4' (1.219 m), weight 50.3 kg (110 lb 14.3 oz), SpO2 95.00%. No results found. No results found for this or any previous visit (from the past 72 hour(s)). Physical Exam  Constitutional: She is oriented to person, place, and time. She appears well-developed and well-nourished.   HENT:  Head: Normocephalic and atraumatic.  Wears full set dentures.  Eyes: Pupils are equal, round, and reactive to light.  Neck: Normal range of motion. Neck supple.  Cardiovascular: Normal rate and regular rhythm. Exam reveals no gallop and no friction rub.  No murmur heard.  Pulmonary/Chest: Effort normal and breath sounds normal.  Abdominal: Soft. Bowel sounds are normal.  Musculoskeletal:  Right Achilles tight.  Neurological: She is alert and oriented to person, place, and time.  Follows commands without difficulty. Speech clear. RLE weakness distally3-/5 more than proximal (4/5). Diminished sensation 1/2 to pp and lt right leg. RUE 5/5 with minimal sensory deficit seen. Mild right facial weakness with no other gross CN abnormalities seen  Cognitively appropriate.  Skin: Skin is warm and dry.  Psychiatric: Judgment and thought content normal.   1/15 Assessment/Plan: 1. 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 Physiatrist is providing close team supervision and 24 hour management of active medical problems listed below. Physiatrist and rehab team continue to assess  barriers to discharge/monitor patient progress toward functional and medical goals. Team conference today. Mobility: Bed Mobility Bed Mobility: Yes Supine to Sit: 5: Supervision;HOB flat Supine to Sit Details (indicate cue type and reason): Pt relies heavily on UEs to pull up to sit, but does so safely.  Transfers Transfers: Yes Sit to Stand: 5: Supervision;From chair/3-in-1;With armrests Sit to Stand Details (indicate cue type and reason): Definite use of bil UEs with tendancy to lean L, decreased R LE engagement Stand to Sit: 5: Supervision;With upper extremity assist Stand to Sit Details: Pt unable to recall cues for hand placement between trials Stand Pivot Transfers: 3: Mod assist Stand Pivot Transfer Details (indicate cue type and reason): Cues for sequencing and foot placement. Pt required Mod A for safe turning from [facility] to bed due to fatigue Ambulation/Gait Ambulation/Gait Assistance: 4: Min assist Ambulation/Gait Assistance Details (indicate cue type and reason): intermittent toe drag R, or foot slap, initially difficulty with RLE swing overall but improved, pt tends to look down, increase LOB with head turns and turns, staggers, instruction for safe RW use and cue for heel strike R Ambulation Distance (Feet): 10 Feet Assistive device: Rolling walker Gait Pattern: Right hip hike;Right circumduction;Step-through pattern;Decreased stride length;Right steppage;Lateral trunk lean to right Gait velocity: decreased Stairs: Yes Stairs Assistance: 4: Min assist Stairs Assistance Details (indicate cue type and reason): Pt unable to perform correct gait pattern even while stating it. A needed for steadying especially as she is unable to clear step with RLE Stair Management Technique: Two rails;Step to pattern;Forwards Number of Stairs: 4  Height of Stairs: 5  Wheelchair Mobility Wheelchair Mobility: Yes Wheelchair Assistance: 3: Mod Education officer, museum: Both upper  extremities Wheelchair Parts Management:  Supervision/cueing Distance: 160  ADL:    Cognition: Cognition Overall Cognitive Status: Impaired at baseline Arousal/Alertness: Awake/alert Orientation Level: Oriented X4 Attention: Sustained (needed min verbal cues for attention in quiet room ) Focused Attention: Appears intact Sustained Attention: Appears intact Sustained Attention Impairment: Verbal basic;Verbal complex;Functional basic;Functional complex Selective Attention: Appears intact Memory: Impaired Memory Impairment: Decreased recall of new information Awareness: Impaired Awareness Impairment: Anticipatory impairment (observed ambulating in room, unattended prior to treatment.) Problem Solving: Impaired Problem Solving Impairment: Verbal basic;Verbal complex;Functional basic;Functional complex (also difficulty with thought organization/reasoning tasks ) Executive Function: Sequencing;Organizing;Self Monitoring Reasoning: Impaired Reasoning Impairment: Verbal complex;Verbal basic Sequencing: Appears intact (verbally assessed) Organizing: Appears intact Decision Making: Appears intact Initiating: Appears intact Self Monitoring: Impaired Self Monitoring Impairment: Verbal basic;Verbal complex;Functional basic Behaviors: Impulsive Safety/Judgment: Impaired Comments:  (observed ambulating w/o assistance (family present)) Cognition Arousal/Alertness: Awake/alert Orientation Level: Oriented X4  Medical Problem List and Plan:  1. DVT Prophylaxis/Anticoagulation: Lovenox.   2. Pain Management: Denies pain at present   3. Mood: Offer ego support. Educated patient about importance of medication management and compliance past discharge.  4.HTN: Monitor with bid checks.  Blood pressure control improving. Will change Norvasc to ace to help with finances./ due to CVA.  5. Trivial PFO:  BLE dopplers negative   6. Nicotine abuse:  Has been refusing nicotine patch daily. Reiterate  need to quit smoking.   7. Dyslipidemia: On crestor daily.   8.  Trichomonas UTI treated with flagyll  9.  CVA prophyllaxis ASA  LOS (Days) 5 A FACE TO FACE EVALUATION WAS PERFORMED  Jacquelynn Cree 03/12/2011, 9:09 AM

## 2011-03-12 NOTE — Progress Notes (Signed)
Social Work Patient ID: Katelyn [name], female   DOB: 08-23-1950, 61 y.o.   MRN: [id number]  Met with pt and two daughter's to discuss team conference and goals-supervision level and discharge date-1/23-Wed. Discussed need for family to go through family education with pt and will get son here to see pt in therapies.  Boyfriend Also needs to come in and participate since he will be with her at home also.  Discussed OP therapies via the [facility] agreeable to transporting pt there after discharge.  Have contacted MD who will see pt as follow up.  Pt pleased with Progress and looks forward to discharge soon.

## 2011-03-12 NOTE — Progress Notes (Addendum)
Occupational Therapy Session Note  Patient Details  Name: Katelyn [name] MRN: [id number] Date of Birth: 06-07-1950  Today's Date: 03/12/2011  Precautions: Precautions Precautions: Fall Precaution Comments: safety awareness, attention to tasks Required Braces or Orthoses: No Restrictions Weight Bearing Restrictions: No  Short Term Goals: OT Short Term Goal 1: Patient will complete bathing, in tub, using DME prn, with min assist OT Short Term Goal 1 - Progress: Progressing toward goal OT Short Term Goal 2: Patient will perform light housekeeping using appropriate gait aid and supervision for safety OT Short Term Goal 2 - Progress: Progressing toward goal OT Short Term Goal 3: Patient will complete simple meal prep with supervision for safety OT Short Term Goal 3 - Progress: Progressing toward goal OT Short Term Goal 4: Patient will complete lower body dressing, using AE prn, with supervision OT Short Term Goal 4 - Progress: Progressing toward goal  Treatment Session 1: Time: 0730-0825 Time Calculation (min): 55 min Pt with no c/o pain this session, reports RLE stiff this AM. Pt seen for ADL retraining at sink level per pt's request.  Pt bathed at sit to stand level with focus on RLE initiation and weight shifting.  Pt demonstrated appropriate safety awareness with dressing at sit to stand level.  Grooming conducted in standing at sink.    Treatment Session 2: Time: 1130-1200  Time Calculation (min): 30 min Engaged in therapeutic activity and self-care of stepping into bathtub, getting down in tub, and getting back out.  Pt really wants to bathe in the bathtub.  Pt required min assist and use of grab bar to side and in front to step in, min assist lowering down, attempt to get into tall kneeling to step out with LOB, pt required assist to lift off bottom onto feet prior to stepping out of tub.  Discussed with pt the safety aspects of getting down in the tub and requiring someone at  this time to help her in and out, suggesting tub bench to increase independence with getting in and out of tub/shower for shower.  Therapeutic activity in standing to challenge balance and focus on cognition with sequencing. Pt with no c/o pain this session.  Therapy/Group: Individual Therapy  Leonette Monarch 03/12/2011, 8:28 AM

## 2011-03-12 NOTE — Progress Notes (Signed)
Alert and oriented x 3. Some memory deficits noted. Continent bowel/bladder. No BM today. LBM 1/14. Uses bathroom toilet for voiding, uses grab bar and performs own dressing and hygiene during toileting. Handheld assist to standby assist with rolling walker for transfer. Impulsivity and poor safety awareness noted. Bed alarm in use. No complaints of pain. Daughters concerned about safety in room, educated on safety precautions that are in use. Patient encouraged to call for assistance at all times. Will continue with plan of care. Hedy Camara

## 2011-03-12 NOTE — Patient Care Conference (Signed)
Inpatient RehabilitationTeam Conference Note Date: 03/12/2011   Time: 1:00 PM    Patient Name: Katelyn [name]      Medical Record Number: [id number]  Date of Birth: [birth year] Sex: Female         Room/Bed: 4038/4038-01 Payor Info: Payor: Advertising copywriter MEDICARE  Plan: [employer] MEDICARE COMPLETE  Product Type: *No Product type*     Admitting Diagnosis: LT CVA  Admit Date/Time:  03/07/2011 12:24 PM Admission Comments: No comment available   Primary Diagnosis:  CVA (cerebral infarction) Principal Problem: CVA (cerebral infarction)  Patient Active Problem List  Diagnoses Date Noted  . 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 CKD (chronic kidney disease) stage 3, GFR 30-59 ml/min 03/04/2011  . Diastolic dysfunction 03/04/2011  . Tobacco abuse 03/04/2011  . Constipation 03/04/2011  . Malignant hypertensive urgency 03/01/2011  . CVA (cerebral infarction) 03/01/2011  . NEUROPATHY, IDIOPATHIC 10/25/2007  . Acute bronchitis 12/30/2006  . HYPERLIPIDEMIA 12/15/2006  . HYPERTENSION 12/15/2006    Expected Discharge Date: Expected Discharge Date: 03/19/11  Team Members Present: Physician: Dr. Claudette Laws Case Manager Present: Lutricia Horsfall, RN Social Worker Present: Dossie Der, LCSW PT Present: Edman Circle, PT OT Present: Leonette Monarch, Felipa Eth, OT SLP Present: Fae Pippin, SLP Nurse: Laural Roes, RN     Current Status/Progress Goal Weekly Team Focus  Medical   RLE weakness  improve, avoid injury  strengthen   Bowel/Bladder   Continent of bowel and bladder.  LBM 1-14  Continent of bowel and bladder.      Swallow/Nutrition/ Hydration             ADL's   supervision bathing and dressing, supervision/cues ambulation and transfers with RW  supervision advanced ADLs, mod I self-cares  AD education, advanced ADLs, family ed   Mobility   min a for transfers and gait with RW x 150'  mod I transfers; S gait  mobility and balance retraining, nuero  re-ed, safety, pt ed   Communication             Safety/Cognition/ Behavioral Observations  minimal-mod assist  supervision  increase recall strategies, problem solving   Pain   Denies.  Pain </=2      Skin   Intact  No new skin breakdown         *See Interdisciplinary Assessment and Plan and progress notes for long and short-term goals  Barriers to Discharge: Identify caregiver     Possible Resolutions to Barriers:  SW to assess    Discharge Planning/Teaching Needs:  Home with boyfriend and daughter's to assist.  Wants short length of stay.      Team Discussion:  Poor safety awareness. Pt will need 24/7 supervision. Pt's daughters and partner to come for education. Education focus on safety. Pt wanting to go home.  Revisions to Treatment Plan:  none   Continued Need for Acute Rehabilitation Level of Care: The patient requires daily medical management by a physician with specialized training in physical medicine and rehabilitation for the following conditions: Daily direction of a multidisciplinary physical rehabilitation program to ensure safe treatment while eliciting the highest outcome that is of practical value to the patient.: Yes Daily medical management of patient stability for increased activity during participation in an intensive rehabilitation regime.: Yes Daily analysis of laboratory values and/or radiology reports with any subsequent need for medication adjustment of medical intervention for : Neurological problems  SW reviewed Team Conferece with pt and family. Meryl Dare 03/12/2011, 2:30 PM

## 2011-03-12 NOTE — Progress Notes (Signed)
Progress Notes  Speech Language Pathology Therapy Note  Patient Details  Name: Lossie Kalp MRN: [id number] Date of Birth: 1950-04-14  Today's Date: 03/12/2011 Time: 1000-1045 Time Calculation (min): 45 min  Precautions: Precautions Precautions: Fall Precaution Comments: safety awareness, attention to tasks Required Braces or Orthoses: No Restrictions Weight Bearing Restrictions: No  Short Term Goals: set 03/08/11  1. Patient will participate in functional problem solving tasks with minimal verbal and tactile cues  2. Patient will demonstrate elective atention to task in mildly distracting environment for 10 minutes with moderate verbal cues from therapist  3. Patient will demonstrate intellectual awareness of one physical and one cognitive deficit with minimal verbal, question cues from therapist  Skilled Therapeutic Interventions/Progress Updates: Session focused on medication management and recall of daily information.  SLP facilitated loading a pill box accurately with supervision semantic cues following minimal assist semantic cues to recall function and frequency of a given medication. Additionally, the patient required minimal assist sematic cues to navigate the hall way and attend to things in right if her environment.   Pain Pain Assessment Pain Assessment: No/denies pain Pain Score: 0-No pain  Therapy/Group: Individual Therapy  Elaf Clauson 03/12/2011 12:19 PM

## 2011-03-13 NOTE — Progress Notes (Signed)
Alert and oriented x 3. Supervision with rolling walker. Grab bars for assist with toilet transfers. Calls appropriately. LBM 1/17. Bed alarms in use. Daughters in room. No complaints of pain. Will continue with plan of care.

## 2011-03-13 NOTE — Progress Notes (Signed)
Progress Notes  Speech Language Pathology Therapy Note  Patient Details  Name: Katelyn [name] MRN: [id number] Date of Birth: 21-Sep-1950  Today's Date: 03/13/2011 Time: 1445-1530 Time Calculation (min): 45 min  Precautions: Precautions Precautions: Fall Precaution Comments: safety awareness, attention to tasks Required Braces or Orthoses: No Restrictions Weight Bearing Restrictions: No  Skilled Therapeutic Interventions/Progress Updates: SLP facilitated session with questions regarding medication management, patient modified independent with recall of type, function and frequency.  SLP also facilitated session with supervision semantic cues to attend to right of environment when navigating around environmental obstacles.     Pain Pain Assessment Pain Assessment: No/denies pain Pain Score: 0-No pain  Therapy/Group: Individual Therapy  Charlane Ferretti., CCC-SLP [phone]  Katelyn [name] 03/13/2011 4:34 PM

## 2011-03-13 NOTE — Progress Notes (Signed)
Patient ID: Katelyn [name], female   DOB: 1950-06-01, 61 y.o.   MRN: [id number] Subjective/Complaints: Discouraged about R LE weakness Review of Systems  Neurological: Positive for focal weakness. RLE All other systems reviewed and are negative.   Objective: Vital Signs: Blood pressure 140/78, pulse 74, temperature 97.5 F (36.4 C), temperature source Oral, resp. rate 20, height 4' (1.219 m), weight 50.8 kg (111 lb 15.9 oz), SpO2 99.00%. No results found. No results found for this or any previous visit (from the past 72 hour(s)). Physical Exam  Constitutional: She is oriented to person, place, and time. She appears well-developed and well-nourished.   HENT:  Head: Normocephalic and atraumatic.  Wears full set dentures.  Eyes: Pupils are equal, round, and reactive to light.  Neck: Normal range of motion. Neck supple.  Cardiovascular: Normal rate and regular rhythm. Exam reveals no gallop and no friction rub.  No murmur heard.  Pulmonary/Chest: Effort normal and breath sounds normal.  Abdominal: Soft. Bowel sounds are normal.   Neurological: She is alert and oriented to person, place, and time.  Follows commands without difficulty. Speech clear. RLE weakness distally3-/5 more than proximal (4/5). Diminished sensation  to pp and right leg. RUE 5/5 with minimal sensory deficit seen. Mild right facial weakness with no other gross CN abnormalities seen  Cognitively appropriate.  Skin: Skin is warm and dry.  Psychiatric: Judgment and thought content normal.  Assessment/Plan: 1. 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 Physiatrist is providing close team supervision and 24 hour management of active medical problems listed below. Physiatrist and rehab team continue to assess barriers to discharge/monitor patient progress toward functional and medical goals.  Team conference today. Mobility: Bed Mobility Bed Mobility: Yes Supine to Sit: 5: Supervision;HOB flat Supine to Sit Details (indicate cue type and reason): Pt relies heavily on UEs to pull up to sit, but does so safely.  Transfers Transfers: Yes Sit to Stand: 5: Supervision;From chair/3-in-1;With armrests Sit to Stand Details (indicate cue type and reason): Definite use of bil UEs with tendancy to lean L, decreased R LE engagement Stand to Sit: 5: Supervision;With upper extremity assist Stand to Sit Details: Pt unable to recall cues for hand placement between trials Stand Pivot Transfers: 3: Mod assist Stand Pivot Transfer Details (indicate cue type and reason): Cues for sequencing and foot placement. Pt required Mod A for safe turning from [facility] to bed due to fatigue Ambulation/Gait Ambulation/Gait Assistance: 4: Min assist Ambulation/Gait Assistance Details (indicate cue type and reason): intermittent toe drag R, or foot slap, initially difficulty with RLE swing overall but improved, pt tends to look down, increase LOB with head turns and turns, staggers, instruction for safe RW use and cue for heel strike R Ambulation Distance (Feet): 10 Feet Assistive device: Rolling walker Gait Pattern: Right hip hike;Right circumduction;Step-through pattern;Decreased stride length;Right steppage;Lateral trunk lean to right Gait velocity: decreased Stairs: Yes Stairs Assistance: 4: Min assist Stairs Assistance Details (indicate cue type and reason): Pt unable to perform correct gait pattern even while stating it. A needed for steadying especially as she is unable to clear step with RLE Stair Management Technique: Two rails;Step to pattern;Forwards Number of Stairs: 4  Height of Stairs: 5  Corporate treasurer: Yes Wheelchair Assistance: 3: Mod Education officer, museum: Both upper extremities Wheelchair Parts Management: Supervision/cueing Distance: 160  ADL:     Cognition: Cognition Overall Cognitive Status:  Impaired at baseline Arousal/Alertness: Awake/alert Orientation Level: Oriented X4 Attention: Sustained (needed min verbal cues for attention in quiet room ) Focused Attention: Appears intact Sustained Attention: Appears intact Sustained Attention Impairment: Verbal basic;Verbal complex;Functional basic;Functional complex Selective Attention: Appears intact Memory: Impaired Memory Impairment: Decreased recall of new information Awareness: Impaired Awareness Impairment: Anticipatory impairment (observed ambulating in room, unattended prior to treatment.) Problem Solving: Impaired Problem Solving Impairment: Verbal basic;Verbal complex;Functional basic;Functional complex (also difficulty with thought organization/reasoning tasks ) Executive Function: Sequencing;Organizing;Self Monitoring Reasoning: Impaired Reasoning Impairment: Verbal complex;Verbal basic Sequencing: Appears intact (verbally assessed) Organizing: Appears intact Decision Making: Appears intact Initiating: Appears intact Self Monitoring: Impaired Self Monitoring Impairment: Verbal basic;Verbal complex;Functional basic Behaviors: Impulsive Safety/Judgment: Impaired Comments:  (observed ambulating w/o assistance (family present)) Cognition Arousal/Alertness: Awake/alert Orientation Level: Oriented X4  Medical Problem List and Plan:  1. DVT Prophylaxis/Anticoagulation: Lovenox.   2. Pain Management: Denies pain at present   3. Mood: Offer ego support. Educated patient about importance of medication management and compliance past discharge.  4.HTN: Monitor with bid checks.  Blood pressure control improving. Will change Norvasc to ace to help with finances./ due to CVA.  5. Trivial PFO:  BLE dopplers negative   6. Nicotine abuse:  Has been refusing nicotine patch daily. Reiterate need to quit smoking.   7. Dyslipidemia: On crestor daily.   8.  Trichomonas UTI  treated with flagyll  9.  CVA prophyllaxis ASA  LOS (Days) 6 A FACE TO FACE EVALUATION WAS PERFORMED  KIRSTEINS,ANDREW E 03/13/2011, 9:16 AM

## 2011-03-13 NOTE — Progress Notes (Addendum)
Physical Therapy Note  Patient Details  Name: Katelyn [name] MRN: [id number] Date of Birth: 1950-06-16 Today's Date: 03/13/2011  4098-1191 (55 minutes) individual treatment session Pain- no complaint of pain Focus of treatment: improve weight shift to right without loss of balance; Rt LE neuro reed in standing  Treatment : Gait 150 feet RW close supervision with decreasing stagger to right; Kinetron  In standing 20 reps X 3 with/without UE assist. Pt continues to need tactile cues for full weight shift to right in right stance and needs at least minimal UE assist to perform this activity in standing; standing - marching in place , heel-toe raises without UE assist close supervision; Gait to room without AD min assist for balance with increased stagger to right and increased forward trunk lean.   Aleila Syverson,JIM 03/13/2011, 9:43 AM

## 2011-03-13 NOTE — Progress Notes (Signed)
Occupational Therapy Session Note  Patient Details  Name: Katelyn [name] MRN: [id number] Date of Birth: [birth year]  Today's Date: 03/13/2011  Precautions: Precautions Precautions: Fall Precaution Comments: safety awareness, attention to tasks Required Braces or Orthoses: No Restrictions Weight Bearing Restrictions: No  Short Term Goals: OT Short Term Goal 1: Patient will complete bathing, in tub, using DME prn, with min assist OT Short Term Goal 1 - Progress: Progressing toward goal OT Short Term Goal 2: Patient will perform light housekeeping using appropriate gait aid and supervision for safety OT Short Term Goal 2 - Progress: Progressing toward goal OT Short Term Goal 3: Patient will complete simple meal prep with supervision for safety OT Short Term Goal 3 - Progress: Progressing toward goal OT Short Term Goal 4: Patient will complete lower body dressing, using AE prn, with supervision OT Short Term Goal 4 - Progress: Progressing toward goal  Treatment Session 1: Time: 8469-6295 Time Calculation (min): 45 min General Amount of Missed OT Time (min): 15 Minutes (breakfast tray and visiting with family) Engaged in ADL retraining at walk-in shower level. Focus on functional ambulation and transfers with safe/appropriate use of RW.  Pt required min verbal cues to use RW with turns and transfers.  Supervision overall for bathing and dressing. Pt with no c/o pain this session.  Treatment Session 2: Time: 1130-1200 Time Calculation (min): 30 min Engaged in therapeutic exercises with focus on dynamic standing balance, balance reactions during functional tasks, and activity tolerance.  Nustep for 5 mins with pt reporting fatigue in RLE.  Standing on dynamic surface while carrying out task with BUE with no LOB. No c/o pain this session.   Therapy/Group: Individual Therapy  Leonette Monarch 03/13/2011, 9:43 AM

## 2011-03-14 LAB — BASIC METABOLIC PANEL
BUN: 26 mg/dL — ABNORMAL HIGH (ref 6–23)
CO2: 30 mEq/L (ref 19–32)
Chloride: 101 mEq/L (ref 96–112)
Creatinine, Ser: 1.03 mg/dL (ref 0.50–1.10)
Glucose, Bld: 95 mg/dL (ref 70–99)

## 2011-03-14 MED ORDER — LISINOPRIL 2.5 MG PO TABS
2.5000 mg | ORAL_TABLET | Freq: Two times a day (BID) | ORAL | Status: DC
  Administered 2011-03-14 – 2011-03-19 (×10): 2.5 mg via ORAL
  Filled 2011-03-14 (×12): qty 1

## 2011-03-14 NOTE — Progress Notes (Signed)
Occupational Therapy Weekly Progress Note  Patient Details  Name: Katelyn [name] MRN: [id number] Date of Birth: 1950-08-04  Today's Date: 03/14/2011  Patient has met 2 of 4 short term goals.  Pt requires min- mod assist to transfer to/from bathtub with lifting assist out of tub.  Pt is supervision with bathing and dressing without use of AE or DME in walk-in shower.  Pt would benefit from tubbench at home to increase safety and independence with bathing.  Pt continues to require cues for appropriate use of RW as she has the tendency to place it to the side and occasionally drag it along behind.    Patient continues to demonstrate the following deficits: decreased balance, decreased safety awareness, RLE weakness and therefore will continue to benefit from skilled OT intervention to enhance overall performance with ADL and iADL.  Patient progressing toward long term goals..  Continue plan of care.  OT Short Term Goals OT Short Term Goal 1: Patient will complete bathing, in tub, using DME prn, with min assist OT Short Term Goal 1 - Progress: Progressing toward goal OT Short Term Goal 2: Patient will perform light housekeeping using appropriate gait aid and supervision for safety OT Short Term Goal 2 - Progress: Met OT Short Term Goal 3: Patient will complete simple meal prep with supervision for safety OT Short Term Goal 3 - Progress: Progressing toward goal OT Short Term Goal 4: Patient will complete lower body dressing, using AE prn, with supervision OT Short Term Goal 4 - Progress: Met  Therapy Documentation Precautions: Precautions Precautions: Fall Precaution Comments: safety awareness, attention to tasks Required Braces or Orthoses: No Restrictions Weight Bearing Restrictions: No  Pain Pain Assessment Pain Assessment: No/denies pain ADL ADL Eating: Independent Where Assessed-Eating: Edge of bed Grooming: Supervision/safety Where Assessed-Grooming: Standing at sink Upper  Body Bathing: Modified independent Where Assessed-Upper Body Bathing: Shower Lower Body Bathing: Supervision/safety Where Assessed-Lower Body Bathing: Shower Upper Body Dressing: Modified independent (Device) Where Assessed-Upper Body Dressing: Edge of bed Lower Body Dressing: Supervision/safety Where Assessed-Lower Body Dressing: Edge of bed Toileting: Modified independent Where Assessed-Toileting: Teacher, adult education: Distant supervision Statistician Method: Ambulating (with RW) Acupuncturist: Engineer, technical sales: International aid/development worker Method: Engineer, technical sales: Insurance underwriter: Distant supervision Film/video editor Method: Designer, industrial/product: Grab bars  Therapy/Group: Individual Therapy  Leonette Monarch 03/14/2011, 8:06 AM

## 2011-03-14 NOTE — Progress Notes (Signed)
Alert and oriented x 3. Transfers supervision with a rolling walker. Continent bowel/bladder. LBM 1/16, patient denies any bowel interventions at this time. Toileting in bathroom, independent with hygiene and dressing. Encouraging patient to drink more fluids, patient is compliant. No complaints of pain. Will continue with plan of care. Katelyn [name]

## 2011-03-14 NOTE — Progress Notes (Signed)
Occupational Therapy Session Note  Patient Details  Name: Katelyn [name] MRN: [id number] Date of Birth: 04-Mar-1950  Today's Date: 03/14/2011  Precautions: Precautions Precautions: Fall Precaution Comments: safety awareness, attention to tasks Required Braces or Orthoses: No Restrictions Weight Bearing Restrictions: No  Short Term Goals: OT Short Term Goal 1: Patient will complete bathing, in tub, using DME prn, with min assist OT Short Term Goal 1 - Progress: Progressing toward goal OT Short Term Goal 2: Patient will perform light housekeeping using appropriate gait aid and supervision for safety OT Short Term Goal 2 - Progress: Met OT Short Term Goal 3: Patient will complete simple meal prep with supervision for safety OT Short Term Goal 3 - Progress: Progressing toward goal OT Short Term Goal 4: Patient will complete lower body dressing, using AE prn, with supervision OT Short Term Goal 4 - Progress: Met  Treatment Session 1: Time: 0725-0820 Time Calculation (min): 55 min Pt seen for ADL retraining.  Pt reports washing with nsg tech before session when got up to use bathroom.  Focus on functional ambulation with RW, gathering items at beginning of session with use of AD, putting away dirty clothes, picking items off floor without LOB, and verbalizing safety with bathing/dressing and functional tasks of self-care.  Pt demonstrated improved consistency with use of RW throughout session.  Discussed options for tub bathing to try out in next session.  Pt with no c/o pain this session  Treatment Session 2: Time: 1130-1200 Time Calculation (min): 30 min Engaged in home management and self-care with education on safety with tub transfers and simple meal prep.  Pt took baths PTA and would like to return to that.  Practiced floor transfers with pt struggling with technique and requiring min-mod assist to get up. Educated pt on use of tub bench in shower and suggested purchase of grab  bar.  Pt reports not being able to afford items.  Practiced stepping over side of tub (not using tub bench) with close supervision and use of grab bar.  Simulated simple meal prep with obtaining items from high and low cabinets without LOB.  Pt with no c/o pain this session.  Therapy/Group: Individual Therapy  Leonette Monarch 03/14/2011, 8:25 AM

## 2011-03-14 NOTE — Progress Notes (Signed)
Physical Therapy Weekly Progress Note  Patient Details  Name: Katelyn [name] MRN: [id number] Date of Birth: 1950-04-21  Today's Date: 03/14/2011 Time: 1478-2956 Time Calculation (min): 66 min  Patient is making excellent progress toward PT LTG and STG.  Patient has met 2 of 4 short term goals.  Patient is currently supervision overall for transfers and gait in controlled environment with RW.  Patient still requires mod A to perform stairs safely and presents with slight RLE ER and foot drag that increases with fatigue which is much improved with support of AFO.  Patient may benefit from consult with orthotist for brace for home and community mobility.    Patient continues to demonstrate the following deficits: RLE weakness, impaired activity tolerance, impaired balance and gait and therefore will continue to benefit from skilled PT intervention to enhance overall performance with activity tolerance, balance, ability to compensate for deficits, functional use of  right lower extremity, coordination and functional transfers and gait.  Patient progressing toward long term goals..  Continue plan of care.  PT Short Term Goals PT Short Term Goal 1: Sit<>Stand Supervision PT Short Term Goal 1 - Progress: Met PT Short Term Goal 2: Gait x39' with RW supervision PT Short Term Goal 2 - Progress: Met PT Short Term Goal 3: Up/Down 5 stairs with 1 rail with Min A PT Short Term Goal 3 - Progress: Progressing toward goal PT Short Term Goal 4: DGI Score>19 to decrease falls risk PT Short Term Goal 4 - Progress: Progressing toward goal  Therapy Documentation Precautions: Precautions Precautions: Fall Precaution Comments: safety awareness, attention to tasks Required Braces or Orthoses: No Restrictions Weight Bearing Restrictions: No  Skilled Therapeutic Interventions/Progress Updates:   For trunk and RLE stability and weight shift training performed tall kneeling on floor with focus on full hip  extension during tall kneel squats in midline and to R side without UE support, hip ABD in tall kneeling to R and L, and maintaining tall kneeling without UE support during dynamic reaching during ball toss  Pain Pain Assessment Pain Assessment: No/denies pain  Locomotion  Ambulation Ambulation: Yes Ambulation/Gait Assistance: 5: Supervision Ambulation Distance (Feet): 300 Feet Assistive device: Rolling walker;Other (Comment) (R AFO) Ambulation/Gait Assistance Details: Verbal cues for sequencing;Verbal cues for gait pattern Ambulation/Gait Assistance Details (indicate cue type and reason): Patient still with slight R toe drag that increases with fatigue as well as RLE ER; assessed gait with off the shelf AFO (PLS) with improved foot clearance and patient reports improved stability on level surface in controlled environment.   Stairs / Additional Locomotion Stairs: Yes Stairs Assistance: 3: Mod assist Stairs Assistance Details: Tactile cues for sequencing;Tactile cues for weight shifting;Tactile cues for placement;Visual cues/gestures for precautions/safety;Verbal cues for sequencing;Verbal cues for precautions/safety;Verbal cues for technique Stairs Assistance Details (indicate cue type and reason): Patient performed stairs first without AFO with step to and alternating pattern and presented with R foot drag on stairs with one UE support on R rail; added AFO to RLE and patient demonstrated improved foot clearance and safety with stair negotiation; verbal and visual cues needed for safe step to sequence with AFO secondary to AFO limiting some closed chain DF for descent.   Stair Management Technique: One rail Right;Alternating pattern;Step to pattern;Forwards Number of Stairs: 5  Height of Stairs: 6    Other Treatments Treatments Therapeutic Activity: Patient wanting to sit in her tub at home to take bath; demonstrated various ways to enter/exit tub safely with and without tub bench; had  patient give repeat demonstration but patient unable at this time to safely go from bench > floor and return to bench without physical assistance.  Patient did demonstrate floor to mat transfers with min-mod A and total verbal cues for sequence.    Therapy/Group: Individual Therapy  [facility] 03/14/2011, 12:22 PM

## 2011-03-14 NOTE — Progress Notes (Signed)
Per State Regulation 482.30 This chart was reviewed for medical necessity with respect to the patient's Admission/Duration of stay. Pt participating in therapies and progressing toward goals.  Noted BP significantly lower, reported to Marissa Nestle, PA. Working with pt's family on d/c plan and scheduling family ed.   Katelyn [name]                 Nurse Care Manager             Next Review Date: 03/17/11

## 2011-03-14 NOTE — Progress Notes (Signed)
Progress Notes  Speech Language Pathology Therapy Note  Patient Details  Name: Katelyn [name] MRN: [id number] Date of Birth: 06-27-1950  Today's Date: 03/14/2011 Time: 1330-1410 Time Calculation (min): 40 min  Precautions: Precautions Precautions: Fall Precaution Comments: safety awareness, attention to tasks Required Braces or Orthoses: No Restrictions Weight Bearing Restrictions: No  Skilled Therapeutic Interventions/Progress Updates: SLP facilitated session with cue x1 to recall names, functions and frequency of medication and load medication pill box appropriately; supervision safety cues for mobility with right attention to field with navigating hallway.   Pain Pain Assessment Pain Assessment: No/denies pain Pain Score: 0-No pain  Therapy/Group: Individual Therapy  Charlane Ferretti., CCC-SLP [phone]  Katelyn [name] 03/14/2011 4:26 PM

## 2011-03-14 NOTE — Progress Notes (Signed)
Patient ID: Katelyn [name], female   DOB: [birth year], 61 y.o.   MRN: [id number] Subjective/Complaints: Discouraged about R LE weakness.  Spoke to PT regarding AFO-still eval Review of Systems  Neurological: Positive for focal weakness. RLE All other systems reviewed and are negative.  Objective: Vital Signs: Blood pressure 92/64, pulse 70, temperature 97.4 F (36.3 C), temperature source Oral, resp. rate 18, height 4' (1.219 m), weight 50.8 kg (111 lb 15.9 oz), SpO2 97.00%. No results found. No results found for this or any previous visit (from the past 72 hour(s)). Physical Exam  Constitutional: She is oriented to person, place, and time. She appears well-developed and well-nourished.   HENT:  Head: Normocephalic and atraumatic.  Wears full set dentures.   Neck: Normal range of motion. Neck supple.  Cardiovascular: Normal rate and regular rhythm. Exam reveals no gallop and no friction rub.  No murmur heard.  Pulmonary/Chest: Effort normal and breath sounds normal.  Abdominal: Soft. Bowel sounds are normal.  Neurological: She is alert and oriented to person, place, and time.  Follows commands without difficulty. Speech clear. RLE weakness distally3-/5 more than proximal (4/5). Diminished sensation  to pp and right leg. RUE 5/5 with minimal sensory deficit seen. Mild right facial weakness with no other gross CN abnormalities seen  Cognitively appropriate.  Skin: Skin is warm and dry.  Psychiatric: Judgment and thought content normal.   Assessment/Plan: 1. 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 Physiatrist is providing close team supervision and 24 hour management of active medical problems listed below. Physiatrist and rehab team continue to assess barriers to discharge/monitor patient progress toward functional and medical goals. Mobility: Bed  Mobility Bed Mobility: Yes Supine to Sit: 5: Supervision;HOB flat Supine to Sit Details (indicate cue type and reason): Pt relies heavily on UEs to pull up to sit, but does so safely.  Transfers Transfers: Yes Sit to Stand: 5: Supervision;From chair/3-in-1;With armrests Sit to Stand Details (indicate cue type and reason): Definite use of bil UEs with tendancy to lean L, decreased R LE engagement Stand to Sit: 5: Supervision;With upper extremity assist Stand to Sit Details: Pt unable to recall cues for hand placement between trials Stand Pivot Transfers: 3: Mod assist Stand Pivot Transfer Details (indicate cue type and reason): Cues for sequencing and foot placement. Pt required Mod A for safe turning from [facility] to bed due to fatigue Ambulation/Gait Ambulation/Gait Assistance: 4: Min assist Ambulation/Gait Assistance Details (indicate cue type and reason): intermittent toe drag R, or foot slap, initially difficulty with RLE swing overall but improved, pt tends to look down, increase LOB with head turns and turns, staggers, instruction for safe RW use and cue for heel strike R Ambulation Distance (Feet): 10 Feet Assistive device: Rolling walker Gait Pattern: Right hip hike;Right circumduction;Step-through pattern;Decreased stride length;Right steppage;Lateral trunk lean to right Gait velocity: decreased Stairs: Yes Stairs Assistance: 4: Min assist Stairs Assistance Details (indicate cue type and reason): Pt unable to perform correct gait pattern even while stating it. A needed for steadying especially as she is unable to clear step with RLE Stair Management Technique: Two rails;Step to pattern;Forwards Number of Stairs: 4  Height of Stairs: 5  Corporate treasurer: Yes Wheelchair Assistance: 3: Mod Education officer, museum: Both upper extremities Wheelchair Parts Management: Supervision/cueing Distance: 160  ADL:    Cognition: Cognition Overall Cognitive Status:  Impaired at baseline Arousal/Alertness: Awake/alert Orientation  Level: Oriented X4 Attention: Sustained (needed min verbal cues for attention in quiet room ) Focused Attention: Appears intact Sustained Attention: Appears intact Sustained Attention Impairment: Verbal basic;Verbal complex;Functional basic;Functional complex Selective Attention: Appears intact Memory: Impaired Memory Impairment: Decreased recall of new information Awareness: Impaired Awareness Impairment: Anticipatory impairment (observed ambulating in room, unattended prior to treatment.) Problem Solving: Impaired Problem Solving Impairment: Verbal basic;Verbal complex;Functional basic;Functional complex (also difficulty with thought organization/reasoning tasks ) Executive Function: Sequencing;Organizing;Self Monitoring Reasoning: Impaired Reasoning Impairment: Verbal complex;Verbal basic Sequencing: Appears intact (verbally assessed) Organizing: Appears intact Decision Making: Appears intact Initiating: Appears intact Self Monitoring: Impaired Self Monitoring Impairment: Verbal basic;Verbal complex;Functional basic Behaviors: Impulsive Safety/Judgment: Impaired Comments:  (observed ambulating w/o assistance (family present)) Cognition Arousal/Alertness: Awake/alert Orientation Level: Oriented X4  Medical Problem List and Plan:  1. DVT Prophylaxis/Anticoagulation: Lovenox.   2. Pain Management: Denies pain at present   3. Mood: Offer ego support. Educated patient about importance of medication management and compliance past discharge.  4.HTN: Monitor with bid checks.  Blood pressure control improving. Will change Norvasc to ace to help with finances./ due to CVA.  5. Trivial PFO:  BLE dopplers negative   6. Nicotine abuse:  Has been refusing nicotine patch daily. Reiterate need to quit smoking.   7. Dyslipidemia: On crestor daily.   8.  Trichomonas UTI treated with flagyll  9.  CVA prophyllaxis  ASA  LOS (Days) 7 A FACE TO FACE EVALUATION WAS PERFORMED  KIRSTEINS,ANDREW E 03/14/2011, 9:00 AM

## 2011-03-14 NOTE — Progress Notes (Signed)
Social Work Patient ID: Katelyn [name], female   DOB: July 29, 1950, 61 y.o.   MRN: [id number]  Met with pt and spoke with daughter-[name] regarding calling the half way house pt's son is in to get permission for him to Come and observe/participate in therapies with Mom.  Director-Dale has given him permission to come Monday-1/21 9:00-12:00 Have talked with [name] to inform since she will be transporting him here.  Scheduled family education for Monday-1/21 9:00-12:00 Continue to work toward discharge on Wednesday 1/23.  Pt pleased he is able to come and see her in therapies.

## 2011-03-15 NOTE — Progress Notes (Signed)
Patient ID: Katelyn [name], female   DOB: 04/16/1950, 61 y.o.   MRN: [id number] Patient ID: Katelyn [name], female   DOB: March 30, 1950, 61 y.o.   MRN: [id number] Subjective/Complaints: No active complaints this am  Review of Systems  Neurological: Positive for focal weakness. RLE All other systems reviewed and are negative.  Objective: Vital Signs: Blood pressure 107/77, pulse 78, temperature 97.6 F (36.4 C), temperature source Oral, resp. rate 18, height 4' (1.219 m), weight 111 lb 15.9 oz (50.8 kg), SpO2 95.00%. No results found. Results for orders placed during the hospital encounter of 03/07/11 (from the past 72 hour(s))  BASIC METABOLIC PANEL     Status: Abnormal   Collection Time   03/14/11 12:37 PM      Component Value Range Comment   Sodium 137  135 - 145 (mEq/L)    Potassium 5.1  3.5 - 5.1 (mEq/L)    Chloride 101  96 - 112 (mEq/L)    CO2 30  19 - 32 (mEq/L)    Glucose, Bld 95  70 - 99 (mg/dL)    BUN 26 (*) 6 - 23 (mg/dL)    Creatinine, Ser 9.62  0.50 - 1.10 (mg/dL)    Calcium 95.2  8.4 - 10.5 (mg/dL)    GFR calc non Af Amer 57 (*) >90 (mL/min)    GFR calc Af Amer 67 (*) >90 (mL/min)    Physical Exam  Constitutional: She is oriented to person, place, and time. She appears well-developed and well-nourished.   HENT:  Head: Normocephalic and atraumatic.  Wears full set dentures.   Neck: Normal range of motion. Neck supple.  Cardiovascular: Normal rate and regular rhythm. Exam reveals no gallop and no friction rub.  No murmur heard.  Pulmonary/Chest: Effort normal and breath sounds normal.  Abdominal: Soft. Bowel sounds are normal.  Neurological: She is alert and oriented to person, place, and time.  Follows commands without difficulty. Speech clear. RLE weakness distally3-/5 more than proximal (4/5). Diminished sensation  to pp and right leg. RUE 5/5 with minimal sensory deficit seen. Mild right facial weakness with no other gross CN abnormalities seen  Cognitively  appropriate.  Skin: Skin is warm and dry.  Psychiatric: Judgment and thought content normal.   Assessment/Plan: 1. 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 Physiatrist is providing close team supervision and 24 hour management of active medical problems listed below. Physiatrist and rehab team continue to assess barriers to discharge/monitor patient progress toward functional and medical goals. Mobility: Bed Mobility Bed Mobility: Yes Supine to Sit: 5: Supervision;HOB flat Supine to Sit Details (indicate cue type and reason): Pt relies heavily on UEs to pull up to sit, but does so safely.  Transfers Transfers: Yes Sit to Stand: 5: Supervision;From chair/3-in-1;With armrests Sit to Stand Details (indicate cue type and reason): Definite use of bil UEs with tendancy to lean L, decreased R LE engagement Stand to Sit: 5: Supervision;With upper extremity assist Stand to Sit Details: Pt unable to recall cues for hand placement between trials Stand Pivot Transfers: 3: Mod assist Stand Pivot Transfer Details (indicate cue type and reason): Cues for sequencing and foot placement. Pt required Mod A for safe turning from [facility] to bed due to fatigue Ambulation/Gait Ambulation/Gait Assistance: 5: Supervision Ambulation/Gait Assistance Details (indicate cue type and reason): Patient still with slight R toe drag that increases with fatigue as well as RLE ER;  assessed gait with off the shelf AFO (PLS) with improved foot clearance and patient reports improved stability on level surface in controlled environment.   Ambulation Distance (Feet): 300 Feet Assistive device: Rolling walker;Other (Comment) (R AFO) Gait Pattern: Right hip hike;Right circumduction;Step-through pattern;Decreased stride length;Right steppage;Lateral trunk lean to right Gait velocity: decreased Stairs:  Yes Stairs Assistance: 3: Mod assist Stairs Assistance Details (indicate cue type and reason): Patient performed stairs first without AFO with step to and alternating pattern and presented with R foot drag on stairs with one UE support on R rail; added AFO to RLE and patient demonstrated improved foot clearance and safety with stair negotiation; verbal and visual cues needed for safe step to sequence with AFO secondary to AFO limiting some closed chain DF for descent.   Stair Management Technique: One rail Right;Alternating pattern;Step to pattern;Forwards Number of Stairs: 5  Height of Stairs: 6  Corporate treasurer: Yes Wheelchair Assistance: 3: Mod Education officer, museum: Both upper extremities Wheelchair Parts Management: Supervision/cueing Distance: 160  ADL:    Cognition: Cognition Overall Cognitive Status: Impaired at baseline Arousal/Alertness: Awake/alert Orientation Level: Oriented X4 Attention: Sustained (needed min verbal cues for attention in quiet room ) Focused Attention: Appears intact Sustained Attention: Appears intact Sustained Attention Impairment: Verbal basic;Verbal complex;Functional basic;Functional complex Selective Attention: Appears intact Memory: Impaired Memory Impairment: Decreased recall of new information Awareness: Impaired Awareness Impairment: Anticipatory impairment (observed ambulating in room, unattended prior to treatment.) Problem Solving: Impaired Problem Solving Impairment: Verbal basic;Verbal complex;Functional basic;Functional complex (also difficulty with thought organization/reasoning tasks ) Executive Function: Sequencing;Organizing;Self Monitoring Reasoning: Impaired Reasoning Impairment: Verbal complex;Verbal basic Sequencing: Appears intact (verbally assessed) Organizing: Appears intact Decision Making: Appears intact Initiating: Appears intact Self Monitoring: Impaired Self Monitoring Impairment:  Verbal basic;Verbal complex;Functional basic Behaviors: Impulsive Safety/Judgment: Impaired Comments:  (observed ambulating w/o assistance (family present)) Cognition Arousal/Alertness: Awake/alert Orientation Level: Oriented X4  Medical Problem List and Plan:  1. DVT Prophylaxis/Anticoagulation: Lovenox.   2. Pain Management: Denies pain at present   3. Mood: Offer ego support. Educated patient about importance of medication management and compliance past discharge.  4.HTN: Monitor with bid checks.  Blood pressure control improving. Will change Norvasc to ace to help with finances./ due to CVA.  5. Trivial PFO:  BLE dopplers negative   6. Nicotine abuse:  Has been refusing nicotine patch daily. Reiterate need to quit smoking.   7. Dyslipidemia: On crestor daily.   8.  Trichomonas UTI treated with flagyll  9.  CVA prophyllaxis ASA  LOS (Days) 8 A FACE TO FACE EVALUATION WAS PERFORMED  Alex Plotnikov 03/15/2011, 8:38 AM

## 2011-03-15 NOTE — Progress Notes (Deleted)
Occupational Therapy Session Note  Patient Details  Name: Katelyn [name] MRN: [id number] Date of Birth: 03/31/1950  Today's Date: 03/15/2011 Time:  -     Precautions: Precautions Precautions: Fall Precaution Comments: safety awareness, attention to tasks Required Braces or Orthoses: No Restrictions Weight Bearing Restrictions: No  Short Term Goals: OT Short Term Goal 1: Patient will complete bathing, in tub, using DME prn, with min assist OT Short Term Goal 1 - Progress: Progressing toward goal OT Short Term Goal 2: Patient will perform light housekeeping using appropriate gait aid and supervision for safety OT Short Term Goal 2 - Progress: Met OT Short Term Goal 3: Patient will complete simple meal prep with supervision for safety OT Short Term Goal 3 - Progress: Progressing toward goal OT Short Term Goal 4: Patient will complete lower body dressing, using AE prn, with supervision OT Short Term Goal 4 - Progress: Met  Skilled Therapeutic Interventions/Progress Updates: scheduled for ADL but already bathed and dressed; completed home skills as patient stated she will have to make bed and eventually provide some home skill assist and addressed report  that patient with right foot drag/drop during some functional mobility via walk er and HHA    Vital Signs Therapy Vitals Temp: 97.6 F (36.4 C) Temp src: Oral Pulse Rate: 81  Resp: 16  BP: 127/82 mmHg Patient Position, if appropriate: Sitting Oxygen Therapy SpO2: 94 % O2 Device: None (Room air) Pain   ADL ADL Eating: Independent Where Assessed-Eating: Edge of bed Grooming: Supervision/safety Where Assessed-Grooming: Standing at sink Upper Body Bathing: Modified independent Where Assessed-Upper Body Bathing: Shower Lower Body Bathing: Supervision/safety Where Assessed-Lower Body Bathing: Shower Upper Body Dressing: Modified independent (Device) Where Assessed-Upper Body Dressing: Edge of bed Lower Body Dressing:  Supervision/safety Where Assessed-Lower Body Dressing: Edge of bed Toileting: Modified independent Where Assessed-Toileting: Teacher, adult education: Distant supervision Statistician Method: Ambulating (with RW) Acupuncturist: Engineer, technical sales: International aid/development worker Method: Engineer, technical sales: Insurance underwriter: Distant supervision Film/video editor Method: Designer, industrial/product: Grab bars   Therapy/Group: Individual Therapy  Bud Face [facility] 03/15/2011, 3:47 PM

## 2011-03-15 NOTE — Progress Notes (Signed)
Occupational Therapy Session Note  Patient Details  Name: Katelyn [name] MRN: [id number] Date of Birth: 07/10/1950  Today's Date: 03/15/2011 Time: 1100-1200 Time Calculation (min): 60 min  Precautions: Precautions Precautions: Fall Precaution Comments: safety awareness, attention to tasks Required Braces or Orthoses: No Restrictions Weight Bearing Restrictions: No  Short Term Goals: OT Short Term Goal 1: Patient will complete bathing, in tub, using DME prn, with min assist OT Short Term Goal 1 - Progress: Progressing toward goal OT Short Term Goal 2: Patient will perform light housekeeping using appropriate gait aid and supervision for safety OT Short Term Goal 2 - Progress: Met OT Short Term Goal 3: Patient will complete simple meal prep with supervision for safety OT Short Term Goal 3 - Progress: Progressing toward goal OT Short Term Goal 4: Patient will complete lower body dressing, using AE prn, with supervision OT Short Term Goal 4 - Progress: Met  Skilled Therapeutic Interventions/Progress Updates:    Scheduled for b/d but alreay b/d so addressed report of right foot drag via functional mobility (walker level and HHA level) to complete home skills; patient noticed and attempted to self correct each foot drag  Therapy Vitals Temp: 97.6 F (36.4 C) Temp src: Oral Pulse Rate: 81  Resp: 16  BP: 127/82 mmHg Patient Position, if appropriate: Sitting Oxygen Therapy SpO2: 94 % O2 Device: None (Room air) Pain no c/os     ADL Eating: Independent Where Assessed-Eating: Edge of bed Grooming: Supervision/safety Where Assessed-Grooming: Standing at sink Upper Body Bathing: Modified independent Where Assessed-Upper Body Bathing: Shower Lower Body Bathing: Supervision/safety Where Assessed-Lower Body Bathing: Shower Upper Body Dressing: Modified independent (Device) Where Assessed-Upper Body Dressing: Edge of bed Lower Body Dressing: Supervision/safety Where  Assessed-Lower Body Dressing: Edge of bed Toileting: Modified independent Where Assessed-Toileting: Teacher, adult education: Distant supervision Statistician Method: Ambulating (with RW) Acupuncturist: Engineer, technical sales: International aid/development worker Method: Engineer, technical sales: Insurance underwriter: Distant supervision Film/video editor Method: Designer, industrial/product: Grab bars  Therapy/Group: Individual Therapy  Bud Face [facility] 03/15/2011, 4:03 PM

## 2011-03-15 NOTE — Progress Notes (Signed)
Pt alert oriented x3, with short term memory deficits, bed alarm, safety belt in use, up with one assist with right side upper/lower extremety with weakness noted, continent of bowel bladder, last BM 1/17, will continue with plan of care

## 2011-03-16 NOTE — Progress Notes (Signed)
Physical Therapy Note  Patient Details  Name: Katelyn [name] MRN: [id number] Date of Birth: June 19, 1950 Today's Date: 03/16/2011  1400-1455 (55 minutes) individual treatment session Pain: no complaint of pain Focus of treatment: gait treatment without AD to facilitate protective balance reactions Treatment: Gait + 200 feet without AD using Lite Gait sling for safety changing directions - no loss of balance observed; gait without Lite Gait - pt has bias to Rt but improving. Gait to/from room with RW close supervision for safety 120 feet.   Jovani Flury,JIM 03/16/2011, 3:09 PM

## 2011-03-16 NOTE — Progress Notes (Signed)
Patient ID: Katelyn [name], female   DOB: August 06, 1950, 61 y.o.   MRN: [id number] Patient ID: Katelyn [name], female   DOB: 1951/01/26, 61 y.o.   MRN: [id number] Patient ID: Katelyn [name], female   DOB: 1950-08-20, 61 y.o.   MRN: [id number] Subjective/Complaints: No active complaints this am  Review of Systems  Neurological: Positive for focal weakness. RLE All other systems reviewed and are negative.  Objective: Vital Signs: Blood pressure 95/61, pulse 69, temperature 97.5 F (36.4 C), temperature source Oral, resp. rate 18, height 4' (1.219 m), weight 111 lb 15.9 oz (50.8 kg), SpO2 96.00%. No results found. Results for orders placed during the hospital encounter of 03/07/11 (from the past 72 hour(s))  BASIC METABOLIC PANEL     Status: Abnormal   Collection Time   03/14/11 12:37 PM      Component Value Range Comment   Sodium 137  135 - 145 (mEq/L)    Potassium 5.1  3.5 - 5.1 (mEq/L)    Chloride 101  96 - 112 (mEq/L)    CO2 30  19 - 32 (mEq/L)    Glucose, Bld 95  70 - 99 (mg/dL)    BUN 26 (*) 6 - 23 (mg/dL)    Creatinine, Ser 0.86  0.50 - 1.10 (mg/dL)    Calcium 57.8  8.4 - 10.5 (mg/dL)    GFR calc non Af Amer 57 (*) >90 (mL/min)    GFR calc Af Amer 67 (*) >90 (mL/min)    Physical Exam  Constitutional: She is oriented to person, place, and time. She appears well-developed and well-nourished.   HENT:  Head: Normocephalic and atraumatic.  Wears full set dentures.   Neck: Normal range of motion. Neck supple.  Cardiovascular: Normal rate and regular rhythm. Exam reveals no gallop and no friction rub.  No murmur heard.  Pulmonary/Chest: Effort normal and breath sounds normal.  Abdominal: Soft. Bowel sounds are normal.  Neurological: She is alert and oriented to person, place, and time.  Follows commands without difficulty. Speech clear. RLE weakness distally3-/5 more than proximal (4/5). Diminished sensation  to pp and right leg. RUE 5/5 with minimal sensory deficit seen. Mild  right facial weakness with no other gross CN abnormalities seen  Cognitively appropriate.  Skin: Skin is warm and dry.  Psychiatric: Judgment and thought content normal.   Assessment/Plan: 1. 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 Physiatrist is providing close team supervision and 24 hour management of active medical problems listed below. Physiatrist and rehab team continue to assess barriers to discharge/monitor patient progress toward functional and medical goals. Mobility: Bed Mobility Bed Mobility: Yes Supine to Sit: 5: Supervision;HOB flat Supine to Sit Details (indicate cue type and reason): Pt relies heavily on UEs to pull up to sit, but does so safely.  Transfers Transfers: Yes Sit to Stand: 5: Supervision;From chair/3-in-1;With armrests Sit to Stand Details (indicate cue type and reason): Definite use of bil UEs with tendancy to lean L, decreased R LE engagement Stand to Sit: 5: Supervision;With upper extremity assist Stand to Sit Details: Pt unable to recall cues for hand placement between trials Stand Pivot Transfers: 3: Mod assist Stand Pivot Transfer Details (indicate cue type and reason): Cues for sequencing and foot placement. Pt required Mod A for safe turning from [facility] to bed due to fatigue Ambulation/Gait Ambulation/Gait Assistance: 5: Supervision Ambulation/Gait Assistance Details (indicate cue type and reason): Patient  still with slight R toe drag that increases with fatigue as well as RLE ER; assessed gait with off the shelf AFO (PLS) with improved foot clearance and patient reports improved stability on level surface in controlled environment.   Ambulation Distance (Feet): 300 Feet Assistive device: Rolling walker;Other (Comment) (R AFO) Gait Pattern: Right hip hike;Right circumduction;Step-through pattern;Decreased stride  length;Right steppage;Lateral trunk lean to right Gait velocity: decreased Stairs: Yes Stairs Assistance: 3: Mod assist Stairs Assistance Details (indicate cue type and reason): Patient performed stairs first without AFO with step to and alternating pattern and presented with R foot drag on stairs with one UE support on R rail; added AFO to RLE and patient demonstrated improved foot clearance and safety with stair negotiation; verbal and visual cues needed for safe step to sequence with AFO secondary to AFO limiting some closed chain DF for descent.   Stair Management Technique: One rail Right;Alternating pattern;Step to pattern;Forwards Number of Stairs: 5  Height of Stairs: 6  Corporate treasurer: Yes Wheelchair Assistance: 3: Mod Education officer, museum: Both upper extremities Wheelchair Parts Management: Supervision/cueing Distance: 160  ADL:    Cognition: Cognition Overall Cognitive Status: Impaired at baseline Arousal/Alertness: Awake/alert Orientation Level: Oriented X4 Attention: Sustained (needed min verbal cues for attention in quiet room ) Focused Attention: Appears intact Sustained Attention: Appears intact Sustained Attention Impairment: Verbal basic;Verbal complex;Functional basic;Functional complex Selective Attention: Appears intact Memory: Impaired Memory Impairment: Decreased recall of new information Awareness: Impaired Awareness Impairment: Anticipatory impairment (observed ambulating in room, unattended prior to treatment.) Problem Solving: Impaired Problem Solving Impairment: Verbal basic;Verbal complex;Functional basic;Functional complex (also difficulty with thought organization/reasoning tasks ) Executive Function: Sequencing;Organizing;Self Monitoring Reasoning: Impaired Reasoning Impairment: Verbal complex;Verbal basic Sequencing: Appears intact (verbally assessed) Organizing: Appears intact Decision Making: Appears  intact Initiating: Appears intact Self Monitoring: Impaired Self Monitoring Impairment: Verbal basic;Verbal complex;Functional basic Behaviors: Impulsive Safety/Judgment: Impaired Comments:  (observed ambulating w/o assistance (family present)) Cognition Arousal/Alertness: Awake/alert Orientation Level: Oriented X4  Medical Problem List and Plan:  1. DVT Prophylaxis/Anticoagulation: Lovenox.   2. Pain Management: Denies pain at present   3. Mood: Offer ego support. Educated patient about importance of medication management and compliance past discharge.  4.HTN: Monitor with bid checks.  Blood pressure control improving. Will change Norvasc to ace to help with finances./ due to CVA.  5. Trivial PFO:  BLE dopplers negative   6. Nicotine abuse:  Has been refusing nicotine patch daily. Reiterate need to quit smoking.   7. Dyslipidemia: On crestor daily.   8.  Trichomonas UTI treated with flagyll  9.  CVA prophyllaxis ASA  LOS (Days) 9 A FACE TO FACE EVALUATION WAS PERFORMED  Alex Plotnikov 03/16/2011, 8:40 AM

## 2011-03-16 NOTE — Progress Notes (Signed)
Pt alert/oriented x 3, short term memory deficits noted,bed alarm and quick release in use,  OOB with one assist with rolling walker, continent of bowel/bladder last BM 1/19, no complaints of pain, will continue with plan of care

## 2011-03-17 DIAGNOSIS — Z5189 Encounter for other specified aftercare: Secondary | ICD-10-CM

## 2011-03-17 DIAGNOSIS — G8110 Spastic hemiplegia affecting unspecified side: Secondary | ICD-10-CM

## 2011-03-17 DIAGNOSIS — I6330 Cerebral infarction due to thrombosis of unspecified cerebral artery: Secondary | ICD-10-CM

## 2011-03-17 LAB — BASIC METABOLIC PANEL
CO2: 27 mEq/L (ref 19–32)
Calcium: 10.3 mg/dL (ref 8.4–10.5)
Creatinine, Ser: 1.05 mg/dL (ref 0.50–1.10)
Glucose, Bld: 92 mg/dL (ref 70–99)

## 2011-03-17 MED ORDER — MAGIC MOUTHWASH W/LIDOCAINE
5.0000 mL | Freq: Three times a day (TID) | ORAL | Status: DC
  Administered 2011-03-17 – 2011-03-19 (×4): 5 mL via ORAL
  Filled 2011-03-17 (×10): qty 5

## 2011-03-17 NOTE — Progress Notes (Signed)
Occupational Therapy Session Note  Patient Details  Name: Katelyn [name] MRN: [id number] Date of Birth: 01-05-1951  Today's Date: 03/17/2011 Time: 0918-1020 Time Calculation (min): 62 min  Precautions: Precautions Precautions: Fall Precaution Comments: safety awareness, attention to tasks Required Braces or Orthoses: No Restrictions Weight Bearing Restrictions: No  Short Term Goals: OT Short Term Goal 1: Patient will complete bathing, in tub, using DME prn, with min assist OT Short Term Goal 1 - Progress: Progressing toward goal OT Short Term Goal 2: Patient will perform light housekeeping using appropriate gait aid and supervision for safety OT Short Term Goal 2 - Progress: Met OT Short Term Goal 3: Patient will complete simple meal prep with supervision for safety OT Short Term Goal 3 - Progress: Progressing toward goal OT Short Term Goal 4: Patient will complete lower body dressing, using AE prn, with supervision OT Short Term Goal 4 - Progress: Met  Skilled Therapeutic Interventions/Progress Updates:    Pt seen for ADL retraining and education with her 3 children.  Family not present for dressing.  Pt reports washing up prior to session with nurse tech.  Pt mod I with dressing with use of RW to gather clothing.  Simulated tub/shower transfer with use of tub bench with family.  Educated pt and family on necessary DME for bathing, pt not wanting to use any DME.  Family and this therapist discussed with pt the importance of using tub bench and grab bars to increase pt's safety and independence with bathing.  To have Becky, SW, discuss DME options with family.  Simulated simple snack prep in kitchen with use of RW.  Pain Pain Assessment Pain Assessment: No/denies pain  Therapy/Group: Individual Therapy  Leonette Monarch 03/17/2011, 12:13 PM

## 2011-03-17 NOTE — Progress Notes (Signed)
Social Work Patient ID: Katelyn [name], female   DOB: [birth year], 61 y.o.   MRN: [id number]  Met with pt, daughters and son who were here for family education. Daughters had many questions regarding strokes And prevention.  RN to answer along with PA.  Discussed follow up therapies and DME.  Want to wait on tub bench Shop around first.  Pt's boyfriend to come tomorrow to go through therapies with pt.  Continue to work toward discharge on  Wednesday, aware recommendation for 24 hour supervision at discharge.

## 2011-03-17 NOTE — Progress Notes (Signed)
Recreational Therapy Assessment and Plan  Patient Details  Name: Katelyn [name] MRN: [id number] Date of Birth: 06-06-1950  Order received, chart reviewed, and discussion with team about pts current functional status and anticipated d/c date of 03/18/10.  No formal TR treatment plan implemented at this time.  Will continue to monitor.  Aprile Dickenson 03/17/2011, 10:44 AM

## 2011-03-17 NOTE — Progress Notes (Signed)
Per State Regulation 482.30 This chart was reviewed for medical necessity with respect to the patient's Admission/Duration of stay. Pt participating in therapies and progressing toward goals. [name] in today for education. BP improved with med adjustment.   Meryl Dare                 Nurse Care Manager            Next Review Date: 03/20/11

## 2011-03-17 NOTE — Progress Notes (Signed)
Patient with short term memory deficits, poor safety awareness, bed alarm and quick release belt in use. Encouraging fluids, continent of bladder, does own hygiene and clothes, occasional asssst on left side for clothes. Last Bowel movement per report was on 03-15-11.refusing nicotine patch per report, awaiting right AFO, min assist with transfers today. Continue plan of care. Katelyn [name], Katelyn [name]

## 2011-03-17 NOTE — Progress Notes (Signed)
Progress Notes  Speech Language Pathology Therapy Note  Patient Details  Name: Katelyn [name] MRN: [id number] Date of Birth: 1950-05-10  Today's Date: 03/17/2011 Time: 1130-1210 Time Calculation (min): 40 min  Precautions: Precautions Precautions: Fall Precaution Comments: safety awareness, attention to tasks Required Braces or Orthoses: No Restrictions Weight Bearing Restrictions: No  Short Term Goals: 1. Patient will participate in functional problem solving tasks with minimal verbal and tactile cues  2. Patient will demonstrate elective atention to task in mildly distracting environment for 10 minutes with moderate verbal cues from therapist  3. Patient will demonstrate intellectual awareness of one physical and one cognitive deficit with minimal verbal, question cues from therapist  Skilled Therapeutic Interventions/Progress Updates: Daughters and son present for family education today and provided with handouts to assist with stroke prevention as well as tables/charts to utilize to compensate for memory deficits.  SLP facilitated session with no cues to recall names, functions and frequency of medication and load medication pill box appropriately; supervision safety cues for mobility with right attention to field with navigating hallway.    Pain Pain Assessment Pain Assessment: No/denies pain Pain Score: 0-No pain  Therapy/Group: Individual Therapy  Charlane Ferretti., CCC-SLP [phone]  Hope Holst 03/17/2011 3:19 PM

## 2011-03-17 NOTE — Progress Notes (Signed)
Patient ID: Katelyn [name], female   DOB: 04/05/1950, 61 y.o.   MRN: [id number]   [id number] Subjective/Complaints: Discouraged about R LE weakness.  Spoke to PT regarding AFO-still eval Review of Systems  Neurological: Positive for focal weakness. RLE All other systems reviewed and are negative.  Objective: Vital Signs: Blood pressure 115/78, pulse 72, temperature 97.5 F (36.4 C), temperature source Oral, resp. rate 19, height 4' (1.219 m), weight 50.8 kg (111 lb 15.9 oz), SpO2 95.00%. No results found. Results for orders placed during the hospital encounter of 03/07/11 (from the past 72 hour(s))  BASIC METABOLIC PANEL     Status: Abnormal   Collection Time   03/14/11 12:37 PM      Component Value Range Comment   Sodium 137  135 - 145 (mEq/L)    Potassium 5.1  3.5 - 5.1 (mEq/L)    Chloride 101  96 - 112 (mEq/L)    CO2 30  19 - 32 (mEq/L)    Glucose, Bld 95  70 - 99 (mg/dL)    BUN 26 (*) 6 - 23 (mg/dL)    Creatinine, Ser 7.82  0.50 - 1.10 (mg/dL)    Calcium 95.6  8.4 - 10.5 (mg/dL)    GFR calc non Af Amer 57 (*) >90 (mL/min)    GFR calc Af Amer 67 (*) >90 (mL/min)   BASIC METABOLIC PANEL     Status: Abnormal   Collection Time   03/17/11  5:00 AM      Component Value Range Comment   Sodium 138  135 - 145 (mEq/L)    Potassium 4.9  3.5 - 5.1 (mEq/L)    Chloride 103  96 - 112 (mEq/L)    CO2 27  19 - 32 (mEq/L)    Glucose, Bld 92  70 - 99 (mg/dL)    BUN 28 (*) 6 - 23 (mg/dL)    Creatinine, Ser 2.13  0.50 - 1.10 (mg/dL)    Calcium 08.6  8.4 - 10.5 (mg/dL)    GFR calc non Af Amer 56 (*) >90 (mL/min)    GFR calc Af Amer 65 (*) >90 (mL/min)    Physical Exam  Constitutional: She is oriented to person, place, and time. She appears well-developed and well-nourished.   HENT:  Head: Normocephalic and atraumatic.  Wears full set dentures.   Neck: Normal range of motion. Neck supple.  Cardiovascular: Normal rate and regular rhythm. Exam reveals no gallop and no friction rub.  No murmur  heard.  Pulmonary/Chest: Effort normal and breath sounds normal.  Abdominal: Soft. Bowel sounds are normal.  Neurological: She is alert and oriented to person, place, and time.  Follows commands without difficulty. Speech clear. RLE weakness distally3-/5 more than proximal (4/5). Diminished sensation  To LT right leg. RUE 4+/5 Mild right facial weakness with no other gross CN abnormalities seen  Cognitively appropriate.  Skin: Skin is warm and dry.  Psychiatric: Judgment and thought content normal.   Assessment/Plan: 1. 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 Physiatrist is providing close team supervision and 24 hour management of active medical problems listed below. Physiatrist and rehab team continue to assess barriers to discharge/monitor patient progress toward functional and medical goals.  AFO prior to D/C Mobility: Bed Mobility Bed Mobility: Yes Supine to Sit: 5: Supervision;HOB flat Supine to Sit Details (indicate cue type and reason): Pt relies heavily on UEs to pull  up to sit, but does so safely.  Transfers Transfers: Yes Sit to Stand: 5: Supervision;From chair/3-in-1;With armrests Sit to Stand Details (indicate cue type and reason): Definite use of bil UEs with tendancy to lean L, decreased R LE engagement Stand to Sit: 5: Supervision;With upper extremity assist Stand to Sit Details: Pt unable to recall cues for hand placement between trials Stand Pivot Transfers: 3: Mod assist Stand Pivot Transfer Details (indicate cue type and reason): Cues for sequencing and foot placement. Pt required Mod A for safe turning from [facility] to bed due to fatigue Ambulation/Gait Ambulation/Gait Assistance: 5: Supervision Ambulation/Gait Assistance Details (indicate cue type and reason): Patient still with slight R toe drag that increases with fatigue as well as  RLE ER; assessed gait with off the shelf AFO (PLS) with improved foot clearance and patient reports improved stability on level surface in controlled environment.   Ambulation Distance (Feet): 300 Feet Assistive device: Rolling walker;Other (Comment) (R AFO) Gait Pattern: Right hip hike;Right circumduction;Step-through pattern;Decreased stride length;Right steppage;Lateral trunk lean to right Gait velocity: decreased Stairs: Yes Stairs Assistance: 3: Mod assist Stairs Assistance Details (indicate cue type and reason): Patient performed stairs first without AFO with step to and alternating pattern and presented with R foot drag on stairs with one UE support on R rail; added AFO to RLE and patient demonstrated improved foot clearance and safety with stair negotiation; verbal and visual cues needed for safe step to sequence with AFO secondary to AFO limiting some closed chain DF for descent.   Stair Management Technique: One rail Right;Alternating pattern;Step to pattern;Forwards Number of Stairs: 5  Height of Stairs: 6  Corporate treasurer: Yes Wheelchair Assistance: 3: Mod Education officer, museum: Both upper extremities Wheelchair Parts Management: Supervision/cueing Distance: 160  ADL:    Cognition: Cognition Overall Cognitive Status: Impaired at baseline Arousal/Alertness: Awake/alert Orientation Level: Oriented X4 Attention: Sustained (needed min verbal cues for attention in quiet room ) Focused Attention: Appears intact Sustained Attention: Appears intact Sustained Attention Impairment: Verbal basic;Verbal complex;Functional basic;Functional complex Selective Attention: Appears intact Memory: Impaired Memory Impairment: Decreased recall of new information Awareness: Impaired Awareness Impairment: Anticipatory impairment (observed ambulating in room, unattended prior to treatment.) Problem Solving: Impaired Problem Solving Impairment: Verbal basic;Verbal  complex;Functional basic;Functional complex (also difficulty with thought organization/reasoning tasks ) Executive Function: Sequencing;Organizing;Self Monitoring Reasoning: Impaired Reasoning Impairment: Verbal complex;Verbal basic Sequencing: Appears intact (verbally assessed) Organizing: Appears intact Decision Making: Appears intact Initiating: Appears intact Self Monitoring: Impaired Self Monitoring Impairment: Verbal basic;Verbal complex;Functional basic Behaviors: Impulsive Safety/Judgment: Impaired Comments:  (observed ambulating w/o assistance (family present)) Cognition Arousal/Alertness: Awake/alert Orientation Level: Oriented X4  Medical Problem List and Plan:  1. DVT Prophylaxis/Anticoagulation: Lovenox.   2. Pain Management: Denies pain at present   3. Mood: Offer ego support. Educated patient about importance of medication management and compliance past discharge.  4.HTN: Monitor with bid checks.  Blood pressure control improving. Will change Norvasc to ace to help with finances./ due to CVA.  5. Trivial PFO:  BLE dopplers negative   6. Nicotine abuse:  Has been refusing nicotine patch daily. Reiterate need to quit smoking.   7. Dyslipidemia: On crestor daily.   8.  Trichomonas UTI treated with flagyll  9.  CVA prophyllaxis ASA  LOS (Days) 10 A FACE TO FACE EVALUATION WAS PERFORMED  Judye Lorino E 03/17/2011, 8:10 AM

## 2011-03-17 NOTE — Progress Notes (Signed)
Physical Therapy Session Note  Patient Details  Name: Katelyn [name] MRN: [id number] Date of Birth: Aug 13, 1950  Today's Date: 03/17/2011 Time: 0918-1020 1407-1500 Time Calculation (min): 62 min and 53 min  Precautions: Precautions Precautions: Fall Precaution Comments: safety awareness, attention to tasks Required Braces or Orthoses: No Restrictions Weight Bearing Restrictions: No  Short Term Goals: PT Short Term Goal 1: Sit<>Stand Supervision PT Short Term Goal 1 - Progress: Met PT Short Term Goal 2: Gait x57' with RW supervision PT Short Term Goal 2 - Progress: Met PT Short Term Goal 3: Up/Down 5 stairs with 1 rail with Min A PT Short Term Goal 3 - Progress: Progressing toward goal PT Short Term Goal 4: DGI Score>19 to decrease falls risk PT Short Term Goal 4 - Progress: Progressing toward goal  Pain Pain Assessment Pain Assessment: No/denies pain  Locomotion  Ambulation Ambulation: Yes Ambulation/Gait Assistance: 5: Supervision Ambulation Distance (Feet): 200 Feet Assistive device: Rolling walker;Other (Comment) (AFO R foot) Ambulation/Gait Assistance Details: Verbal cues for precautions/safety Ambulation/Gait Assistance Details (indicate cue type and reason): Discussed with family benefits of AFO for improved foot clearance during household and community gait on uneven surfaces and patient's increased risk for falls when fatigued on uneven surfaces.  Daughter states that the patient may have to walk through grass to get to car; allowed daughter to practiced gait with HHA on L side and UE around waist to assist with balance recovery.   Stairs / Additional Locomotion Stairs: Yes Stairs Assistance: 4: Min assist Stairs Assistance Details: Verbal cues for precautions/safety;Verbal cues for gait pattern;Tactile cues for placement;Tactile cues for sequencing Stairs Assistance Details (indicate cue type and reason): Discussed safety concerns with use of AFO on stairs and  safe sequence.  Had patient perform stairs x 2 reps with AFO with verbal cues each time for correct sequence.  Allowed daughter to practice with patient giving appropriate assistance and verbal cues and discussed placement of RW on porch or at bottom of stairs.  Also discussed that if patient has to exit through back entrance how to perform stairs with HHA using same sequence for LE.   Stair Management Technique: One rail Right;Forwards;Step to pattern Number of Stairs: 12  Height of Stairs: 6.5   PM session: Re-assessment of DGI: see navigator for individual tasks; DGI score improved from 8/24 to 12/24 but still indicative of increased risk for falling during community mobility; increased difficulty with weaving around obstacles and stepping over obstacles and mild LOB during head turns. Exercises  Gave patient and demonstrated 6 standing balance and strengthening exercises with bilat UE support; patient gave repeat demonstration of each exercise with bilat UE support and min-mod A and verbal and tactile cues for correct muscle activation and trunk activation.  Also performed standing balance exercises with UE support on table: heel-toe walking x 4 reps, single limb stance x 10 sec x 4 reps each LE with one UE support, sit to stand from chair without UE support x 8 reps with assistance to increase activation of RLE.  Other Treatments Treatments Therapeutic Activity: Patient's children present for family education; patient's boyfriend not present; strongly encouraged patient to have boyfriend come to therapy tomorrow for education; reviewed with children car transfer sequence in and out of tall SUV and lower regular car with use of overhead handle; patient gave 2-3 repeat demonstrations with supervision and verbal cues for safe sequence.  Also discussed safety concerns if patient were to fall in her home and demonstrated floor to furniture sequence  and patient gave repeat demonstration with supervision.   Discussed pros and cons of having life alert system or keeping cell phone on her at all times and her ability to dial if she were to have another stroke.  Will have social worker discuss with family cost of life alert system.    Therapy/Group: Individual Therapy  Edman Circle [facility] 03/17/2011, 12:17 PM

## 2011-03-18 MED ORDER — ASPIRIN 325 MG PO TBEC: 325.0000 mg | DELAYED_RELEASE_TABLET | Freq: Every day | ORAL | Status: AC

## 2011-03-18 MED ORDER — MAGIC MOUTHWASH W/LIDOCAINE: 5.0000 mL | Freq: Three times a day (TID) | ORAL | Status: DC

## 2011-03-18 MED ORDER — LISINOPRIL 2.5 MG PO TABS: 2.5000 mg | ORAL_TABLET | Freq: Two times a day (BID) | ORAL | Status: DC

## 2011-03-18 MED ORDER — PRAVASTATIN SODIUM 40 MG PO TABS: ORAL_TABLET | ORAL | Status: DC

## 2011-03-18 MED ORDER — NICOTINE 21 MG/24HR TD PT24: 1.0000 | MEDICATED_PATCH | Freq: Every day | TRANSDERMAL | Status: AC

## 2011-03-18 MED ORDER — ADULT MULTIVITAMIN W/MINERALS CH: 1.0000 | ORAL_TABLET | Freq: Every day | ORAL | Status: DC

## 2011-03-18 NOTE — Progress Notes (Signed)
Physical Therapy Discharge Summary  Patient Details  Name: Katelyn [name] MRN: [id number] Date of Birth: 04-Jan-1951 Today's Date: 03/18/2011 Treatment Time: 1001-1053 (52 min) and 1400-1444 (44 minutes)  Pain  Pain Assessment  Pain Assessment: No/denies pain   Locomotion  Ambulation  Ambulation: Yes  Ambulation/Gait Assistance: 5: Supervision  Ambulation Distance (Feet): 200 Feet  Assistive device: Rolling walker;Other (Comment) (AFO R foot)  Ambulation/Gait Assistance Details: Verbal cues for precautions/safety  Ambulation/Gait Assistance Details (indicate cue type and reason): Discussed with boyfriend benefits of AFO for improved foot clearance during household and community gait on uneven surfaces and patient's increased risk for falls when fatigued on uneven surfaces. Demonstrated to patient had patient give repeat demonstration of walking across compliant surface to mimic ambulation through grass to get to car while lifting and placing RW Stairs / Additional Locomotion  Stairs: Yes  Stairs Assistance: 4: Min assist  Stairs Assistance Details: Verbal cues for precautions/safety;Verbal cues for gait pattern;Tactile cues for placement;Tactile cues for sequencing  Stairs Assistance Details (indicate cue type and reason): Discussed safety concerns with use of AFO on stairs and safe sequence with boyfriend and demonstrated safe sequence. Had patient perform stairs x 2 reps with AFO with verbal cues each time for correct sequence. Allowed boyfriend to practice with patient giving appropriate assistance and verbal cues and discussed placement of RW on porch or at bottom of stairs.  Stair Management Technique: One rail Right;Forwards;Step to pattern  Number of Stairs: 12  Height of Stairs: 6.5  PM Session: Orthotist present to observe patient gait and assess patient for appropriate off the shelf AFO; observed patient without and with trial off the shelf AFO and noted improvement in DF, toe  clearance and stability during ambulation on level surfaces and on stairs; patient to receive off the shelf carbon PLS for foot clearance but still allow anterior tibial translation.  Other Treatments Treatments  Therapeutic Activity: Patient's boyfriend present for family education; reviewed with boyfriend low car transfer and patient gave repeat demonstrations with supervision and verbal cues for safe sequence. Also discussed safety concerns if patient were to fall in her home and demonstrated floor to furniture sequence and patient gave repeat demonstration with supervision.  PM session: performed sit to stand training from w/c with UE sliding up and down wall and anterior/posterior leans to and from wall for ankle and hip strategy training and to fully bring COG over BOS with min A.  Patient has met 6 of 7 long term goals due to improved activity tolerance, improved balance, improved postural control, increased strength, ability to compensate for deficits, functional use of  right lower extremity, improved attention, improved awareness and improved coordination.  Patient to discharge at an ambulatory level Supervision.   Patient's care partner is independent to provide the necessary supervision and minimal assistance on stairs at discharge.  Recommendation:  Patient will benefit from ongoing skilled PT services in home health setting to continue to advance safe functional mobility, address ongoing impairments in RUE and LE strength, postural control, standing dynamic balance and balance strategies, activity tolerance, gait impairments and to minimize fall risk secondary to patient being at increased risk for falls as indicated by DGI: 12/24.  Equipment: Equipment provided: RW and off the shelf AFO  Patient/family agrees with progress made and goals achieved: Yes  Edman Circle Faucette 03/18/2011, 12:51 PM

## 2011-03-18 NOTE — Progress Notes (Signed)
Occupational Therapy Discharge Summary  Patient Details  Name: Katelyn [name] MRN: [id number] Date of Birth: [birth year] Today's Date: 03/18/2011  Patient has met 9 of 9 long term goals due to improved balance, ability to compensate for deficits and improved coordination.  Patient's care partner is independent to provide the necessary cognitive assistance at discharge.  Patient is supervision with bathing at shower level, due to RLE weakness unable to complete transfer from down in tub.  Patient has agreed to bathe at shower level with tub bench (which family is to buy) as this method is safer and requires less assistance.  Patient's 3 children and boyfriend have been in for family education.  Her daughters verbalized and demonstrated understanding of supervision level and use of AD for safety.  Pt's boyfriend verbalizes understanding stating he "can take care of her".    Recommendation:  Patient will continue to received OT services in a home health setting to continue to advance functional skills in the area of ADL.  Equipment: Equipment provided: RW  Patient/family agrees with progress made and goals achieved: Yes  Leonette Monarch 03/18/2011, 3:39 PM

## 2011-03-18 NOTE — Progress Notes (Signed)
Social Work Patient ID: Katelyn [name], female   DOB: 1950-08-23, 61 y.o.   MRN: [id number]  Met with pt, daughter and boyfriend to discuss discharge needs. Boyfriend here to go through family education. Pt very concerned about her finances and wants Home Health therapies due to no co-pay over OP therapies there are Costs involved.  She is aware of her medicine costs and reports it will be tight.  Daughter would like her to do OP therapies but Understands Mom's concern.  Referral made to [facility]-PT, OT and RN.  Daughter to get tub bench for Mom. Continue to work on discharge for tomorrow.

## 2011-03-18 NOTE — Progress Notes (Signed)
Patient ID: Katelyn [name], female   DOB: 21-Aug-1950, 61 y.o.   MRN: [id number]   [id number] Subjective/Complaints: Looking forward to D/C in am Review of Systems  Neurological: Positive for focal weakness. RLE All other systems reviewed and are negative.  Objective: Vital Signs: Blood pressure 116/77, pulse 72, temperature 97.3 F (36.3 C), temperature source Oral, resp. rate 16, height 4' (1.219 m), weight 50.8 kg (111 lb 15.9 oz), SpO2 95.00%. No results found. Results for orders placed during the hospital encounter of 03/07/11 (from the past 72 hour(s))  BASIC METABOLIC PANEL     Status: Abnormal   Collection Time   03/17/11  5:00 AM      Component Value Range Comment   Sodium 138  135 - 145 (mEq/L)    Potassium 4.9  3.5 - 5.1 (mEq/L)    Chloride 103  96 - 112 (mEq/L)    CO2 27  19 - 32 (mEq/L)    Glucose, Bld 92  70 - 99 (mg/dL)    BUN 28 (*) 6 - 23 (mg/dL)    Creatinine, Ser 5.28  0.50 - 1.10 (mg/dL)    Calcium 41.3  8.4 - 10.5 (mg/dL)    GFR calc non Af Amer 56 (*) >90 (mL/min)    GFR calc Af Amer 65 (*) >90 (mL/min)    Physical Exam  Constitutional: She is oriented to person, place, and time. She appears well-developed and well-nourished.   HENT:  Head: Normocephalic and atraumatic.  Wears full set dentures.   Neck: Normal range of motion. Neck supple.  Cardiovascular: Normal rate and regular rhythm. Exam reveals no gallop and no friction rub.  No murmur heard.  Pulmonary/Chest: Effort normal and breath sounds normal.  Abdominal: Soft. Bowel sounds are normal.  Neurological: She is alert and oriented to person, place, and time.  Follows commands without difficulty. Speech clear. RLE weakness distally3-/5 more than proximal (4/5). Diminished sensation  To LT right leg. RUE 5/5 Mild right facial weakness with no other gross CN abnormalities seen  Cognitively appropriate.  Skin: Skin is warm and dry.  Psychiatric: Judgment and thought content normal.    Assessment/Plan: 1. 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 Physiatrist is providing close team supervision and 24 hour management of active medical problems listed below. Physiatrist and rehab team continue to assess barriers to discharge/monitor patient progress toward functional and medical goals.  Mobility: Bed Mobility Bed Mobility: Yes Supine to Sit: 5: Supervision;HOB flat Supine to Sit Details (indicate cue type and reason): Pt relies heavily on UEs to pull up to sit, but does so safely.  Transfers Transfers: Yes Sit to Stand: 5: Supervision;From chair/3-in-1;With armrests Sit to Stand Details (indicate cue type and reason): Definite use of bil UEs with tendancy to lean L, decreased R LE engagement Stand to Sit: 5: Supervision;With upper extremity assist Stand to Sit Details: Pt unable to recall cues for hand placement between trials Stand Pivot Transfers: 3: Mod assist Stand Pivot Transfer Details (indicate cue type and reason): Cues for sequencing and foot placement. Pt required Mod A for safe turning from [facility] to bed due to fatigue Ambulation/Gait Ambulation/Gait Assistance: 5: Supervision Ambulation/Gait Assistance Details (indicate cue type and reason): Discussed with family benefits of AFO for improved foot clearance during household and community gait on uneven surfaces and patient's increased risk for falls when fatigued on uneven surfaces.  Daughter states  that the patient may have to walk through grass to get to car; allowed daughter to practiced gait with HHA on L side and UE around waist to assist with balance recovery.   Ambulation Distance (Feet): 200 Feet Assistive device: Rolling walker;Other (Comment) (AFO R foot) Gait Pattern: Right hip hike;Right circumduction;Step-through pattern;Decreased stride length;Right steppage;Lateral  trunk lean to right Gait velocity: decreased Stairs: Yes Stairs Assistance: 4: Min assist Stairs Assistance Details (indicate cue type and reason): Discussed safety concerns with use of AFO on stairs and safe sequence.  Had patient perform stairs x 2 reps with AFO with verbal cues each time for correct sequence.  Allowed daughter to practice with patient giving appropriate assistance and verbal cues and discussed placement of RW on porch or at bottom of stairs.  Also discussed that if patient has to exit through back entrance how to perform stairs with HHA using same sequence for LE.   Stair Management Technique: One rail Right;Forwards;Step to pattern Number of Stairs: 12  Height of Stairs: 6.5  Wheelchair Mobility Wheelchair Mobility: Yes Wheelchair Assistance: 3: Mod Education officer, museum: Both upper extremities Wheelchair Parts Management: Supervision/cueing Distance: 160  ADL:    Cognition: Cognition Overall Cognitive Status: Impaired at baseline Arousal/Alertness: Awake/alert Orientation Level: Oriented to person;Oriented to place;Other (Comment) (poor safety awareness at times) Attention: Sustained (needed min verbal cues for attention in quiet room ) Focused Attention: Appears intact Sustained Attention: Appears intact Sustained Attention Impairment: Verbal basic;Verbal complex;Functional basic;Functional complex Selective Attention: Appears intact Memory: Impaired Memory Impairment: Decreased recall of new information Awareness: Impaired Awareness Impairment: Anticipatory impairment (observed ambulating in room, unattended prior to treatment.) Problem Solving: Impaired Problem Solving Impairment: Verbal basic;Verbal complex;Functional basic;Functional complex (also difficulty with thought organization/reasoning tasks ) Executive Function: Sequencing;Organizing;Self Monitoring Reasoning: Impaired Reasoning Impairment: Verbal complex;Verbal basic Sequencing: Appears  intact (verbally assessed) Organizing: Appears intact Decision Making: Appears intact Initiating: Appears intact Self Monitoring: Impaired Self Monitoring Impairment: Verbal basic;Verbal complex;Functional basic Behaviors: Impulsive Safety/Judgment: Impaired Comments:  (observed ambulating w/o assistance (family present)) Cognition Arousal/Alertness: Awake/alert Orientation Level: Oriented to person;Oriented to place;Other (Comment) (poor safety awareness at times)  Medical Problem List and Plan:  1. DVT Prophylaxis/Anticoagulation: Lovenox.   2. Pain Management: Denies pain at present   3. Mood: Offer ego support. Educated patient about importance of medication management and compliance past discharge.  4.HTN: Monitor with bid checks.  Blood pressure control improving. Will change Norvasc to ace to help with finances./ due to CVA.  5. Trivial PFO:  BLE dopplers negative   6. Nicotine abuse:  Has been refusing nicotine patch daily. Reiterate need to quit smoking.   7. Dyslipidemia: On crestor daily.   8.  Trichomonas UTI treated with flagyll  9.  CVA prophyllaxis ASA  LOS (Days) 11 A FACE TO FACE EVALUATION WAS PERFORMED  Katelyn [name] 03/18/2011, 8:33 AM

## 2011-03-18 NOTE — Progress Notes (Signed)
Progress Notes  Speech Language Pathology Therapy Note  Patient Details  Name: Katelyn [name] MRN: [id number] Date of Birth: 1950-11-05  Today's Date: 03/18/2011 Time: 1030-1045 Time Calculation (min): 15 min  Precautions: Precautions Precautions: Fall Precaution Comments: safety awareness, attention to tasks Required Braces or Orthoses: No Restrictions Weight Bearing Restrictions: No  Skilled Therapeutic Interventions/Progress Updates: Session focused on education with significant other regarding complex problem solving and awareness of deficits and when she needs assistance (significant other will be providing 24/7 supervision upon discharge).  Educated regarding medications and use of written reminder and pill box to assist with carryover and supervision to check after loading for accuracy, as well as close supervision with mobility cue to right inattention when walking.  Significant other verbalized understanding as did patient.  Patient had questions regarding prescriptions and PA was notified.     Pain Pain Assessment Pain Assessment: No/denies pain Pain Score: 0-No pain  Speech Language Pathology Discharge Summary   Long term goals set: 3  Long term goals met: 3  Comments on progress toward goals: Patient modified independent with recall of medication, function and frequency as well as loading it accurately educated regarding recommendation of supervision to check for safety for a couple weeks until she gets into the routine.  Additionally, patient requires supervision cues for safety with mobility tasks, education completed and all further treatment can be addressed by home health PT.   Reasons goals not met: n/a  Equipment acquired: none  Reasons for discharge: treatment goals met and discharge from hospital  Follow-up: none   Patient/family agrees with progress made and goals achieved: Yes  Therapy/Group: Individual Therapy  Charlane Ferretti.,  CCC-SLP [phone]  Katelyn [name] 03/18/2011 11:58 AM

## 2011-03-18 NOTE — Progress Notes (Signed)
Occupational Therapy Session Note  Patient Details  Name: Katelyn [name] MRN: [id number] Date of Birth: 06-27-1950  Today's Date: 03/18/2011  Precautions: Precautions Precautions: Fall Precaution Comments: safety awareness, attention to tasks Required Braces or Orthoses: No Restrictions Weight Bearing Restrictions: No  Short Term Goals: OT Short Term Goal 1: Patient will complete bathing, in tub, using DME prn, with min assist OT Short Term Goal 1 - Progress: Progressing toward goal OT Short Term Goal 2: Patient will perform light housekeeping using appropriate gait aid and supervision for safety OT Short Term Goal 2 - Progress: Met OT Short Term Goal 3: Patient will complete simple meal prep with supervision for safety OT Short Term Goal 3 - Progress: Progressing toward goal OT Short Term Goal 4: Patient will complete lower body dressing, using AE prn, with supervision OT Short Term Goal 4 - Progress: Met  Treatment Session 1: Time: 4098-1191 Time Calculation (min): 40 min Pt seen for ADL retraining at shower level with focus on increased independence with cues for safety only.  Pt demonstrated improved independence with bathing in standing 100% of shower with use of grab bar for steady assist.  Pt completed bathing and dressing with distant supervision.  Pt continues to require verbal cues for appropriate use of RW with ambulation.  Pt's boyfriend in room for last 10 minutes with education on safety with RW and recommended equipment for tub/shower transfers. Pt with no c/o pain this session.  Treatment Session 2: Time: 1330-1400 Time Calculation (min): 30 min Engaged in basic home management with activity in kitchen with obtaining items from high and low cabinets, watering and caring for plants, and functional mobility with RW.  Pt required 1 verbal cue to use RW in appropriate manner and not set it aside or drag it behind.  Pt with use of walker bag to transport items with  increased independence.   Therapy/Group: Individual Therapy  Leonette Monarch 03/18/2011, 9:24 AM

## 2011-03-19 NOTE — Progress Notes (Signed)
Social Work Discharge Note Discharge Note  The overall goal for the admission was met for:   Discharge location: Yes-HOME WITH [name]-BOYFRIEND, DAUGHTERS TO CHECK ON AT HOME  Length of Stay: Yes-10 DAYS  Discharge activity level: Yes-24 HOUR SUPERVISION LEVEL  Home/community participation: Yes  Services provided included: MD, RD, PT, OT, SLP, RN, CM, TR, Pharmacy and SW  Financial Services: Private Insurance: [facility]  Follow-up services arranged: Home Health: [facility]-PT, OT, RN, DME: [facility]  FAMILY TO GET TUB BENCH and Patient/Family has no preference for HH/DME agencies  Comments (or additional information): AWARE NEED TO CHANGE LIFE STYLE-NO SMOKING, PLANS TO WORK ON THIS.  Patient/Family verbalized understanding of follow-up arrangements: Yes  Individual responsible for coordination of the follow-up plan: SELF AND [name]-BOYFRIEND  Confirmed correct DME delivered: Lucy Chris 03/19/2011    Lucy Chris

## 2011-03-19 NOTE — Progress Notes (Signed)
Patient ID: Katelyn [name], female   DOB: 1950/04/18, 61 y.o.   MRN: [id number]   [id number] Subjective/Complaints: Looking forward to D/C today Review of Systems  Neurological: Positive for focal weakness. RLE All other systems reviewed and are negative.  Objective: Vital Signs: Blood pressure 107/65, pulse 68, temperature 97.5 F (36.4 C), temperature source Oral, resp. rate 20, height 4' (1.219 m), weight 50.8 kg (111 lb 15.9 oz), SpO2 96.00%. No results found. Results for orders placed during the hospital encounter of 03/07/11 (from the past 72 hour(s))  BASIC METABOLIC PANEL     Status: Abnormal   Collection Time   03/17/11  5:00 AM      Component Value Range Comment   Sodium 138  135 - 145 (mEq/L)    Potassium 4.9  3.5 - 5.1 (mEq/L)    Chloride 103  96 - 112 (mEq/L)    CO2 27  19 - 32 (mEq/L)    Glucose, Bld 92  70 - 99 (mg/dL)    BUN 28 (*) 6 - 23 (mg/dL)    Creatinine, Ser 0.86  0.50 - 1.10 (mg/dL)    Calcium 57.8  8.4 - 10.5 (mg/dL)    GFR calc non Af Amer 56 (*) >90 (mL/min)    GFR calc Af Amer 65 (*) >90 (mL/min)    Physical Exam  Constitutional: She is oriented to person, place, and time. She appears well-developed and well-nourished.   Head: Normocephalic and atraumatic.  Wears full set dentures.   Neck: Normal range of motion. Neck supple.  Cardiovascular: Normal rate and regular rhythm. Exam reveals no gallop and no friction rub.  No murmur heard.  Pulmonary/Chest: Effort normal and breath sounds normal.  Abdominal: Soft. Bowel sounds are normal.  Neurological: She is alert and oriented to person, place, and time.  Follows commands without difficulty. Speech clear. RLE weakness distally3-/5 more than proximal (4/5). Diminished sensation  To LT right leg. RUE 5/5 Mild right facial weakness with no other gross CN abnormalities seen  Cognitively appropriate.  Skin: Skin is warm and dry.  Psychiatric: Judgment and thought content normal.   Assessment/Plan: 1.  Functional deficits secondary to L cingulate gyrus infarct stable for D/C today   Mobility: Bed Mobility Bed Mobility: Yes Supine to Sit: 5: Supervision;HOB flat Supine to Sit Details (indicate cue type and reason): Pt relies heavily on UEs to pull up to sit, but does so safely.  Transfers Transfers: Yes Sit to Stand: 5: Supervision;From chair/3-in-1;With armrests Sit to Stand Details (indicate cue type and reason): Definite use of bil UEs with tendancy to lean L, decreased R LE engagement Stand to Sit: 5: Supervision;With upper extremity assist Stand to Sit Details: Pt unable to recall cues for hand placement between trials Stand Pivot Transfers: 3: Mod assist Stand Pivot Transfer Details (indicate cue type and reason): Cues for sequencing and foot placement. Pt required Mod A for safe turning from [facility] to bed due to fatigue Ambulation/Gait Ambulation/Gait Assistance: 5: Supervision Ambulation/Gait Assistance Details (indicate cue type and reason): Discussed with family benefits of AFO for improved foot clearance during household and community gait on uneven surfaces and patient's increased risk for falls when fatigued on uneven surfaces.  Daughter states that the patient may have to walk through grass to get to car; allowed daughter to practiced gait with HHA on L side and UE around waist to assist with balance recovery.   Ambulation Distance (Feet): 200 Feet Assistive device: Rolling walker;Other (Comment) (AFO R foot) Gait Pattern:  Right hip hike;Right circumduction;Step-through pattern;Decreased stride length;Right steppage;Lateral trunk lean to right Gait velocity: decreased Stairs: Yes Stairs Assistance: 4: Min assist Stairs Assistance Details (indicate cue type and reason): Discussed safety concerns with use of AFO on stairs and safe sequence.  Had patient perform stairs x 2 reps with AFO with verbal cues each time for correct sequence.  Allowed daughter to practice with patient giving  appropriate assistance and verbal cues and discussed placement of RW on porch or at bottom of stairs.  Also discussed that if patient has to exit through back entrance how to perform stairs with HHA using same sequence for LE.   Stair Management Technique: One rail Right;Forwards;Step to pattern Number of Stairs: 12  Height of Stairs: 6.5  Wheelchair Mobility Wheelchair Mobility: Yes Wheelchair Assistance: 3: Mod Education officer, museum: Both upper extremities Wheelchair Parts Management: Supervision/cueing Distance: 160  ADL:    Cognition: Cognition Overall Cognitive Status: Impaired at baseline Arousal/Alertness: Awake/alert Orientation Level: Oriented X4 Attention: Sustained (needed min verbal cues for attention in quiet room ) Focused Attention: Appears intact Sustained Attention: Appears intact Sustained Attention Impairment: Verbal basic;Verbal complex;Functional basic;Functional complex Selective Attention: Appears intact Memory: Impaired Memory Impairment: Decreased recall of new information Awareness: Impaired Awareness Impairment: Anticipatory impairment (observed ambulating in room, unattended prior to treatment.) Problem Solving: Impaired Problem Solving Impairment: Verbal basic;Verbal complex;Functional basic;Functional complex (also difficulty with thought organization/reasoning tasks ) Executive Function: Sequencing;Organizing;Self Monitoring Reasoning: Impaired Reasoning Impairment: Verbal complex;Verbal basic Sequencing: Appears intact (verbally assessed) Organizing: Appears intact Decision Making: Appears intact Initiating: Appears intact Self Monitoring: Impaired Self Monitoring Impairment: Verbal basic;Verbal complex;Functional basic Behaviors: Impulsive Safety/Judgment: Impaired Comments:  (observed ambulating w/o assistance (family present)) Cognition Arousal/Alertness: Awake/alert Orientation Level: Oriented X4  Medical Problem List and Plan:    1. DVT Prophylaxis/Anticoagulation: Lovenox.   2. Pain Management: Denies pain at present   3. Mood: Offer ego support. Educated patient about importance of medication management and compliance past discharge.  4.HTN: Monitor with bid checks.  Blood pressure control improving. Will change Norvasc to ace to help with finances./ due to CVA.  5. Trivial PFO:  BLE dopplers negative   6. Nicotine abuse:  Has been refusing nicotine patch daily. Reiterate need to quit smoking.   7. Dyslipidemia: On crestor daily.   8.  CVA prophyllaxis ASA  LOS (Days) 12 A FACE TO FACE EVALUATION WAS PERFORMED  KIRSTEINS,ANDREW E 03/19/2011, 8:22 AM

## 2011-03-19 NOTE — Progress Notes (Signed)
Patient discharged at 1115 to home. Discharge instructions given by P. Love, PA. Patient verbalized understanding. Katelyn [name]

## 2011-03-20 ENCOUNTER — Ambulatory Visit: Payer: Medicare Other | Admitting: Occupational Therapy

## 2011-03-20 ENCOUNTER — Ambulatory Visit: Payer: Medicare Other | Admitting: Physical Therapy

## 2011-03-21 NOTE — Discharge Summary (Signed)
Katelyn [name], Katelyn [name]           ACCOUNT NO.:  [id number]  MEDICAL RECORD NO.:  [id number]  LOCATION:  4038                         FACILITY:  [facility]  PHYSICIAN:  Delle Reining, P.A.      DATE OF BIRTH:  [birth year]  DATE OF ADMISSION:  03/07/2011 DATE OF DISCHARGE:  03/19/2011                              DISCHARGE SUMMARY   DISCHARGE DIAGNOSES: 1. Left cerebrovascular accident. 2. Hypertension. 3. Dyslipidemia. 4. Tobacco abuse.  HISTORY OF PRESENT ILLNESS:  Katelyn [name] is a 61 year old female with prior history of hypertension, dyslipidemia admitted March 01, 2011 with sudden onset of right lower extremity weakness the night prior to admission.  BP at time of admission was elevated at 218/116. The patient was evaluated by Neurology who recommended full stroke workup and aspirin for CVA prophylaxis.  MRI of brain done revealed left cingulate gyrus, nonhemorrhagic infarct.  2D echo done showed an EF of 55% to 60% with no wall abnormality.  Carotid Dopplers done showed no ICA stenosis.  UDS was negative.  TEE done on March 06, 2011 revealing moderate-to-severe left ventricular hypertension with EF of 65% to 70% and minimally positive bubble studies which revealed mitral regurg. Therapy evaluation done on January 7th, and the patient is noted to have decreased balance with tendency to veer to the right as well as difficulty advancing right lower extremity.  Currently blood pressures remained labile.  The patient was evaluated by rehab and we felt that she would benefit from a CIR program.  REVIEW OF SYSTEMS:  Positive for blurry vision.  Negative for shortness of breath or cough.  Negative for chest pain, palpitations.  Negative for GI distress, nausea or vomiting.  Positive for urgency.  Positive for sensory changes on right face as well as focal weakness.  Negative for headaches.  Negative for depression or insomnia.  PAST MEDICAL HISTORY: Hypertension,  hypercholesterolemia. No medications for 2-3 years.  PAST SURGICAL HISTORY:  Abdominal hysterectomy.  FAMILY HISTORY: Diabetes in father and sister.  SOCIAL HISTORY:  The patient lives with long-time boyfriend.  Retired from custodial work.  Was independent and active until recently.  Does report increased headaches and fatigue for the past couple of months. She has been smoking 1 pack per day for the past 30 years.  Does not use alcohol or illicit drugs.  She however drinks couple pots of coffee daily. Children in town are supportive and can check in past discharge. Boyfriend is retired and can assist as needed.  Home in one level with 3-5 steps at entry.  ALLERGIES:  No known drug allergy.  FUNCTIONAL HISTORY:  The patient was independent and active prior to admission.  She does not drive.  FUNCTIONAL STATUS:  The patient is at supervision to minimum assist for transfers.  She is able to ambulate 80 feet with min assist with decreased ability to control in the right lower extremity.  She is at supervision level for upper body bathing, min assist for lower body bathing, requires min assist for upper and lower body dressing.  Speech Therapy evaluation revealed impaired memory with decreased safety impulsivity and decreased awareness for need of assistance.  PHYSICAL EXAMINATION:  VITAL SIGNS:  Blood pressure  130/48, pulse 93, temperature 98.8, respirations 21. GENERAL:  The patient is a well-nourished, well-developed female, appears fatigued; however, in no acute distress. HEENT:  Atraumatic, normocephalic.  Wears full set dentures.  Oral mucosa is without lesion.  Hearing intact.  Pupils are equal, round, and reactive to light. NECK:  Supple with normal range of motion. HEART:  Normal regular rate.  No gallops or rubs. ABDOMEN:  Soft, nontender.  Positive bowel sounds. PULMONARY:  Effort is normal.  Breath sounds normal. MUSCULOSKELETAL:  The patient with tight Achilles heel on  the right. NEUROLOGIC:  The patient is alert and oriented x3.  Follows commands without difficulty.  Speech is clear.  Mild right pronator drift.  Right lower extremity weakness distally at 3+/5, more than proximal which is 4/5.  Diminished sensation 1/2 in lower right leg.  Right upper extremity is grossly 4-4+/5 with minimal sensory deficits.  Mild right facial weakness with no gross cranial nerve abnormalities, although she does squint a bit when tracking an object.  Cognitively appropriate. PSYCHIATRIC:  Judgment and thought content are normal, although she appears a bit withdrawn.  HOSPITAL COURSE:  Katelyn [name] was admitted to rehab on March 07, 2011 for inpatient therapies to consist of PT, OT, and Speech Therapy at least 3 hours 5 days a week.  Past admission, physiatrist, rehab, RN, and therapy team have worked together to provide customized collaborative interdisciplinary care.  Rehab RN has worked with the patient on bowel and bladder program as well as safety education.  Lower extremity Dopplers were done due to patient's history of PFO.  This revealed no superficial thrombosis or DVT.  Routine check of labs were done during this stay.  The patient was noted to have some renal insufficiency at admission with BUN at 25, creatinine 0.92.  She was encouraged to push p.o. fluids, but has limited herself in her intake due to avoid frequency.  Last check of lytes of March 17, 2011, revealed sodium 138, potassium 4.9, chloride 103, CO2 of 27, BUN 28, creatinine 1.05, glucose 92.  CBC at admission revealed hemoglobin 13.1, hematocrit 39.2, white count 9.6, platelets 229.  A UA/UC was done past admission due to complaints of frequency.  UA showed Trichomonas and she was treated with Flagyl for this.  Urine culture showed no growth.  The patient's blood pressures were monitored on b.i.d. basis during this stay.  Initially, the patient was on Norvasc but she expressed  concerns about ability to afford her meds, therefore, she was changed to low-dose lisinopril.  As blood pressures improved, her lisinopril dose was decreased to 2.5 mg b.i.d. due to hypotension.  Blood pressures at time of discharge are ranging from 107-116 systolic, 60-70 diastolic.  The patient has been educated about her stroke risk factors as well as nicotine avoidance and tobacco cessation.  The patient is afraid to use any supplemental medication or nicotine patch due to her concerns regarding side effects. But she reports she had no desire to smoke anymore.  During patient's stay in rehab, weekly team conferences were held to monitor the patient's progress, set goals, as well as discuss barriers to discharge.  At time of admission, the patient was noted to be impaired by decreased balance with right lower extremity weakness, impaired activity tolerance as well as deficits in sustained and selective attention, decreased awareness of deficits and functional problem solving.  The patient was noted to have ataxia with decreased standing balance requiring min assist with basic self-care tasks.  On discharge, the patient was at supervision level for transfers, supervision level for ambulating 200 feet with right AFO.  Required verbal cues for precautions and safety.  She required min assist for navigation of stairs.  She required tactile cues for foot placement and sequencing.  Family education was done with daughter as well as her boyfriend regarding supervision level for fall prevention.  Currently her DGI score has improved from 8/24 to 12/24.  She is still at increased fall risk during community mobility with increased difficulty weaving around obstacles and stepping over obstacles with mild loss of balance with head turns.  Further follow up, home health physical therapy to continue past discharge.  The patient has progressed in ADL tasks to being at modified independent with dressing  with use of rolling walker to gather clothes.  She is able to perform tub shower transfers with the use of tub bench; however, the patient has declined use of DME.  Family was educated about importance of needing tub bench and grab bars to increase patient's safety and independence with bathing.  Speech Therapy has worked with the patient on problem-solving deficits.  Currently, the patient continues to require supervision for safety, awareness about her mobility deficits.  She is modified independent for recall of medication as well as for loading her pill box.  No further followup Speech Therapy needs.  DISCHARGE MEDICATIONS: 1. Aspirin 325 mg per day. 2. Lisinopril 2.5 mg b.i.d. 3. Magic mouthwash with lidocaine 5 mL t.i.d. 4. Pravastatin 40 mg p.o. q.p.m. 5. Multivitamin 1 p.o. per day.  DIET:  Low fat, low cholesterol.  ACTIVITIES:  Activity level is 24-hour supervision, walk with a walker.  SPECIAL INSTRUCTIONS:  No alcohol.  [facility] to provide PT/OT.  FOLLOWUP:  The patient to follow up with Dr. Barbaraann Barthel on March 21, 2011 at 11:15 for 11:45 appointment.  Follow up with Dr. Pearlean Brownie in 6 weeks. Follow up with Dr. Wynn Banker on April 04, 2011 at 10 o'clock for 10:30 appointment.     Delle Reining, P.A.     PL/MEDQ  D:  03/21/2011  T:  03/21/2011  Job:  [id number]  cc:   Pramod P. Pearlean Brownie, MD Fanny Dance. Rankins, M.D.

## 2011-04-04 ENCOUNTER — Inpatient Hospital Stay (HOSPITAL_BASED_OUTPATIENT_CLINIC_OR_DEPARTMENT_OTHER): Payer: Medicare Other | Admitting: Physical Medicine & Rehabilitation

## 2011-04-04 ENCOUNTER — Encounter: Payer: Medicare Other | Attending: Physical Medicine & Rehabilitation

## 2011-04-04 DIAGNOSIS — I69959 Hemiplegia and hemiparesis following unspecified cerebrovascular disease affecting unspecified side: Secondary | ICD-10-CM | POA: Insufficient documentation

## 2011-04-04 DIAGNOSIS — G8110 Spastic hemiplegia affecting unspecified side: Secondary | ICD-10-CM

## 2011-04-04 DIAGNOSIS — I10 Essential (primary) hypertension: Secondary | ICD-10-CM | POA: Insufficient documentation

## 2011-04-04 NOTE — Assessment & Plan Note (Signed)
REASON FOR VISIT:  Right-sided weakness due to stroke.  A 61 year old female with history of uncontrolled hypertension. Initially, blood pressure in ED 218/116, seen by Neurology, full stroke workup.  Carotid Dopplers showed no ICA stenosis.  A 2D echo showed normal ejection fraction.  UDS negative.  TEE, moderate-to-severe left ventricular hypertrophy, EF 65-70%, some mitral regurg.  She has been through inpatient rehab March 06, 2101, to March 19, 2011, [facility], now discharged from [facility] due to good progress.  PHYSICAL EXAMINATION:  VITAL SIGNS:  Blood pressure 155/95, she states it was normal yesterday, she is taking her blood pressure medicine lisinopril 2.5 b.i.d.  She has an appointment with Dr. Luciana Axe on Monday. Her weight is 116 pounds, height 4 feet 3 inches, pulse is 79, respirations 18. GENERAL:  No acute distress.  Mood and affect appropriate. MUSCULOSKELETAL:  Her motor strength is 5/5 bilateral deltoid, biceps, triceps, grip.  Lower extremity strength is 4/5 in the hip flexor, knee extensor, and 3- in the ankle dorsiflexor.  Gait is without evidence of toe drag or knee instability, now with an AFO on.  IMPRESSION:  Left CVA, right hemiparesis, no significant spasticity.  No falls.  No seizure disorder, has uncontrolled hypertension history, overall improved, but elevated in the office today.  We will have Dr. Luciana Axe, recheck it.  I will see her back in 6-8 weeks.  I will make referral to outpatient rehab, PT and OT, likely will need PT more than OT or at least for longer period of time.     Erick Colace, M.D. Electronically Signed    AEK/MedQ D:  04/04/2011 11:24:08  T:  04/04/2011 23:02:12  Job #:  [id number]  cc:   Fanny Dance. Rankins, M.D. Fax: [phone]

## 2011-05-06 ENCOUNTER — Encounter: Payer: Medicare Other | Attending: Physical Medicine & Rehabilitation

## 2011-05-06 ENCOUNTER — Ambulatory Visit (HOSPITAL_BASED_OUTPATIENT_CLINIC_OR_DEPARTMENT_OTHER): Payer: Medicare Other | Admitting: Physical Medicine & Rehabilitation

## 2011-05-06 ENCOUNTER — Encounter: Payer: Self-pay | Admitting: Physical Medicine & Rehabilitation

## 2011-05-06 DIAGNOSIS — M25551 Pain in right hip: Secondary | ICD-10-CM

## 2011-05-06 DIAGNOSIS — M25559 Pain in unspecified hip: Secondary | ICD-10-CM

## 2011-05-06 DIAGNOSIS — I69959 Hemiplegia and hemiparesis following unspecified cerebrovascular disease affecting unspecified side: Secondary | ICD-10-CM | POA: Insufficient documentation

## 2011-05-06 DIAGNOSIS — I10 Essential (primary) hypertension: Secondary | ICD-10-CM | POA: Insufficient documentation

## 2011-05-06 DIAGNOSIS — R29898 Other symptoms and signs involving the musculoskeletal system: Secondary | ICD-10-CM

## 2011-05-06 MED ORDER — GABAPENTIN 300 MG PO CAPS: 300.0000 mg | ORAL_CAPSULE | Freq: Three times a day (TID) | ORAL | Status: AC

## 2011-05-06 NOTE — Patient Instructions (Addendum)
Hip Exercises RANGE OF MOTION (ROM) AND STRETCHING EXERCISES  These exercises may help you when beginning to rehabilitate your injury. Doing them too aggressively can worsen your condition. Complete them slowly and gently. Your symptoms may resolve with or without further involvement from your physician, physical therapist or athletic trainer. While completing these exercises, remember:   Restoring tissue flexibility helps normal motion to return to the joints. This allows healthier, less painful movement and activity.   An effective stretch should be held for at least 30 seconds.   A stretch should never be painful. You should only feel a gentle lengthening or release in the stretched tissue. If these stretches worsen your symptoms even when done gently, consult your physician, physical therapist or athletic trainer.  STRETCH - Hamstrings, Supine   Lie on your back. Loop a belt or towel over the ball of your right / left foot.   Straighten your right / left knee and slowly pull on the belt to raise your leg. Do not allow the right / left knee to bend. Keep your opposite leg flat on the floor.   Raise the leg until you feel a gentle stretch behind your right / left knee or thigh. Hold this position for __________ seconds.  Repeat __________ times. Complete this stretch __________ times per day.  STRETCH - Hip Rotators   Lie on your back on a firm surface. Grasp your right / left knee with your right / left hand and your ankle with your opposite hand.   Keeping your hips and shoulders firmly planted, gently pull your right / left knee and rotate your lower leg toward your opposite shoulder until you feel a stretch in your buttocks.   Hold this stretch for __________ seconds.  Repeat this stretch __________ times. Complete this stretch __________ times per day. STRETCH - Hamstrings/Adductors, V-Sit   Sit on the floor with your legs extended in a large "V," keeping your knees straight.    With your head and chest upright, bend at your waist reaching for your right foot to stretch your left adductors.   You should feel a stretch in your left inner thigh. Hold for __________ seconds.   Return to the upright position to relax your leg muscles.   Continuing to keep your chest upright, bend straight forward at your waist to stretch your hamstrings.   You should feel a stretch behind both of your thighs and/or knees. Hold for __________ seconds.   Return to the upright position to relax your leg muscles.   Repeat steps 2 through 4 for opposite leg.  Repeat __________ times. Complete this exercise __________ times per day.  STRETCHING - Hip Flexors, Lunge  Half kneel with your right / left knee on the floor and your opposite knee bent and directly over your ankle.   Keep good posture with your head over your shoulders. Tighten your buttocks to point your tailbone downward; this will prevent your back from arching too much.   You should feel a gentle stretch in the front of your thigh and/or hip. If you do not feel any resistance, slightly slide your opposite foot forward and then slowly lunge forward so your knee once again lines up over your ankle. Be sure your tailbone remains pointed downward.   Hold this stretch for __________ seconds.  Repeat __________ times. Complete this stretch __________ times per day. STRENGTHENING EXERCISES These exercises may help you when beginning to rehabilitate your injury. They may resolve your symptoms   with or without further involvement from your physician, physical therapist or athletic trainer. While completing these exercises, remember:   Muscles can gain both the endurance and the strength needed for everyday activities through controlled exercises.   Complete these exercises as instructed by your physician, physical therapist or athletic trainer. Progress the resistance and repetitions only as guided.   You may experience muscle  soreness or fatigue, but the pain or discomfort you are trying to eliminate should never worsen during these exercises. If this pain does worsen, stop and make certain you are following the directions exactly. If the pain is still present after adjustments, discontinue the exercise until you can discuss the trouble with your clinician.  STRENGTH - Hip Extensors, Bridge   Lie on your back on a firm surface. Bend your knees and place your feet flat on the floor.   Tighten your buttocks muscles and lift your bottom off the floor until your trunk is level with your thighs. You should feel the muscles in your buttocks and back of your thighs working. If you do not feel these muscles, slide your feet 1-2 inches further away from your buttocks.   Hold this position for __________ seconds.   Slowly lower your hips to the starting position and allow your buttock muscles relax completely before beginning the next repetition.   If this exercise is too easy, you may cross your arms over your chest.  Repeat __________ times. Complete this exercise __________ times per day.  STRENGTH - Hip Abductors, Straight Leg Raises  Be aware of your form throughout the entire exercise so that you exercise the correct muscles. Sloppy form means that you are not strengthening the correct muscles.  Lie on your side so that your head, shoulders, knee and hip line up. You may bend your lower knee to help maintain your balance. Your right / left leg should be on top.   Roll your hips slightly forward, so that your hips are stacked directly over each other and your right / left knee is facing forward.   Lift your top leg up 4-6 inches, leading with your heel. Be sure that your foot does not drift forward or that your knee does not roll toward the ceiling.   Hold this position for __________ seconds. You should feel the muscles in your outer hip lifting (you may not notice this until your leg begins to tire).   Slowly lower  your leg to the starting position. Allow the muscles to fully relax before beginning the next repetition.  Repeat __________ times. Complete this exercise __________ times per day.  STRENGTH - Hip Adductors, Straight Leg Raises   Lie on your side so that your head, shoulders, knee and hip line up. You may place your upper foot in front to help maintain your balance. Your right / left leg should be on the bottom.   Roll your hips slightly forward, so that your hips are stacked directly over each other and your right / left knee is facing forward.   Tense the muscles in your inner thigh and lift your bottom leg 4-6 inches. Hold this position for __________ seconds.   Slowly lower your leg to the starting position. Allow the muscles to fully relax before beginning the next repetition.  Repeat __________ times. Complete this exercise __________ times per day.  STRENGTH - Quadriceps, Straight Leg Raises  Quality counts! Watch for signs that the quadriceps muscle is working to insure you are strengthening the correct   muscles and not "cheating" by substituting with healthier muscles.  Lay on your back with your right / left leg extended and your opposite knee bent.   Tense the muscles in the front of your right / left thigh. You should see either your knee cap slide up or increased dimpling just above the knee. Your thigh may even quiver.   Tighten these muscles even more and raise your leg 4 to 6 inches off the floor. Hold for right / left seconds.   Keeping these muscles tense, lower your leg.   Relax the muscles slowly and completely in between each repetition.  Repeat __________ times. Complete this exercise __________ times per day.  STRENGTH - Hip Abductors, Standing  Tie one end of a rubber exercise band/tubing to a secure surface (table, pole) and tie a loop at the other end.   Place the loop around your right / left ankle. Keeping your ankle with the band directly opposite of the  secured end, step away until there is tension in the tube/band.   Hold onto a chair as needed for balance.   Keeping your back upright, your shoulders over your hips, and your toes pointing forward, lift your right / left leg out to your side. Be sure to lift your leg with your hip muscles. Do not "throw" your leg or tip your body to lift your leg.   Slowly and with control, return to the starting position.  Repeat exercise __________ times. Complete this exercise __________ times per day.  STRENGTH - Quadriceps, Squats  Stand in a door frame so that your feet and knees are in line with the frame.   Use your hands for balance, not support, on the frame.   Slowly lower your weight, bending at the hips and knees. Keep your lower legs upright so that they are parallel with the door frame. Squat only within the range that does not increase your knee pain. Never let your hips drop below your knees.   Slowly return upright, pushing with your legs, not pulling with your hands.  Document Released: 02/28/2005 Document Revised: 01/30/2011 Document Reviewed: 05/25/2008 [employer] Patient Information 2012 [employer]. 

## 2011-05-06 NOTE — Progress Notes (Signed)
Subjective:    Patient ID: Katelyn [name], female    DOB: [birth year], 61 y.o.   MRN: [id number]  HPI A 61 year old female with history of uncontrolled hypertension.  Initially, blood pressure in ED 218/116, seen by Neurology, full stroke  workup. Carotid Dopplers showed no ICA stenosis. A 2D echo showed  normal ejection fraction. UDS negative. TEE, moderate-to-severe left  ventricular hypertrophy, EF 65-70%, some mitral regurg.  She has been through inpatient rehab March 06, 2101, to March 19, 2011, discharged home with [facility], now discharged from  [facility] due to good progress. Since my last visit in February the patient has developed increasing right leg and thigh pain. It does not seem to be in the hip joint or knee joint. Pain is not aggravated by walking. Pain does not prevent sleep. No numbness or tingling in the leg. No falls or other trauma. Pain Inventory Average Pain 7 Pain Right Now 8 My pain is aching  In the last 24 hours, has pain interfered with the following? General activity 6 Relation with others 6 Enjoyment of life 6 What TIME of day is your pain at its worst? All Day Sleep (in general) Good  Pain is worse with: N/A Pain improves with: Nothing Relief from Meds: 0  Mobility walk without assistance use a walker do you drive?  no  Function retired  Neuro/Psych No problems in this area  Prior Studies Any changes since last visit?  no  Physicians involved in your care Any changes since last visit?  no   Review of Systems  Constitutional: Negative.   HENT: Negative.   Eyes: Negative.   Respiratory: Negative.   Cardiovascular: Negative.   Gastrointestinal: Negative.   Genitourinary: Negative.   Musculoskeletal: Negative.   Skin: Negative.   Neurological: Negative.   Hematological: Negative.   Psychiatric/Behavioral: Negative.        Objective:   Physical Exam  Constitutional: She is oriented to person, place,  and time. She appears well-developed and well-nourished.  Eyes: Pupils are equal, round, and reactive to light.  Neck: Normal range of motion.  Musculoskeletal:       Right hip: She exhibits tenderness. She exhibits normal range of motion, normal strength, no bony tenderness, no swelling, no crepitus, no deformity and no laceration.       Right thigh has tenderness proximally below the greater trochanter. It is above the mid thigh level. No deformity. Good range of motion of the hip  Straight leg raising test is equivocal  Neurological: She is alert and oriented to person, place, and time. She displays no atrophy. A sensory deficit is present. She exhibits normal muscle tone. Gait abnormal. Abnormal coordination: decreased coordination on the right side in the legs.  Reflex Scores:      Patellar reflexes are 2+ on the right side.      Achilles reflexes are 2+ on the right side.      Right ankle dorsiflexion 3/5 knee extension 4/5 hip flexion 4/5. She ambulates with an AFO on the right foot and ankle.          Assessment & Plan:  #1. CVA with right lower extremity weakness. She has increasing pain as she is doing more activity. Her examination shows muscle tenderness but no joint abnormalities. The tone appears normal. She's had no imaging studies of the hip and I suggested an x-ray however they're some financial considerations. I will give some range of motion exercises as well as  hip strengthening exercises. I'll see her back in 2 months. We'll trial some Neurontin even this pain started right after the stroke and was not due to any type of trauma. Other imaging studies that may be beneficial would include ultrasound of the area. She has had ultrasound of her veins which was normal.

## 2011-06-02 ENCOUNTER — Encounter: Payer: Self-pay | Admitting: Physical Medicine & Rehabilitation

## 2011-07-04 ENCOUNTER — Ambulatory Visit: Payer: Medicare Other | Admitting: Physical Medicine & Rehabilitation

## 2011-07-29 ENCOUNTER — Ambulatory Visit: Payer: Medicare Other | Admitting: Physical Medicine & Rehabilitation

## 2011-10-06 ENCOUNTER — Ambulatory Visit: Payer: Medicare Other | Admitting: Physical Medicine & Rehabilitation

## 2011-10-06 ENCOUNTER — Encounter: Payer: Medicare Other | Attending: Physical Medicine & Rehabilitation

## 2014-01-07 ENCOUNTER — Encounter (HOSPITAL_COMMUNITY): Payer: Self-pay | Admitting: *Deleted

## 2014-01-07 ENCOUNTER — Inpatient Hospital Stay (HOSPITAL_COMMUNITY)
Admission: EM | Admit: 2014-01-07 | Discharge: 2014-01-14 | DRG: 871 | Disposition: A | Discharge status: Home Health | Payer: Medicare Other | Attending: Internal Medicine | Admitting: Internal Medicine

## 2014-01-07 DIAGNOSIS — K859 Acute pancreatitis without necrosis or infection, unspecified: Secondary | ICD-10-CM

## 2014-01-07 DIAGNOSIS — K851 Biliary acute pancreatitis without necrosis or infection: Secondary | ICD-10-CM | POA: Diagnosis present

## 2014-01-07 DIAGNOSIS — K861 Other chronic pancreatitis: Secondary | ICD-10-CM | POA: Diagnosis present

## 2014-01-07 DIAGNOSIS — A419 Sepsis, unspecified organism: Secondary | ICD-10-CM

## 2014-01-07 DIAGNOSIS — R0602 Shortness of breath: Secondary | ICD-10-CM | POA: Insufficient documentation

## 2014-01-07 DIAGNOSIS — A4189 Other specified sepsis: Principal | ICD-10-CM | POA: Diagnosis present

## 2014-01-07 DIAGNOSIS — N183 Chronic kidney disease, stage 3 unspecified: Secondary | ICD-10-CM | POA: Diagnosis present

## 2014-01-07 DIAGNOSIS — R5381 Other malaise: Secondary | ICD-10-CM | POA: Insufficient documentation

## 2014-01-07 DIAGNOSIS — I5033 Acute on chronic diastolic (congestive) heart failure: Secondary | ICD-10-CM | POA: Diagnosis not present

## 2014-01-07 DIAGNOSIS — Z79899 Other long term (current) drug therapy: Secondary | ICD-10-CM

## 2014-01-07 DIAGNOSIS — Z7982 Long term (current) use of aspirin: Secondary | ICD-10-CM

## 2014-01-07 DIAGNOSIS — R0902 Hypoxemia: Secondary | ICD-10-CM | POA: Insufficient documentation

## 2014-01-07 DIAGNOSIS — K8050 Calculus of bile duct without cholangitis or cholecystitis without obstruction: Secondary | ICD-10-CM

## 2014-01-07 DIAGNOSIS — I10 Essential (primary) hypertension: Secondary | ICD-10-CM | POA: Diagnosis present

## 2014-01-07 DIAGNOSIS — K8689 Other specified diseases of pancreas: Secondary | ICD-10-CM

## 2014-01-07 DIAGNOSIS — N179 Acute kidney failure, unspecified: Secondary | ICD-10-CM | POA: Diagnosis present

## 2014-01-07 DIAGNOSIS — I129 Hypertensive chronic kidney disease with stage 1 through stage 4 chronic kidney disease, or unspecified chronic kidney disease: Secondary | ICD-10-CM | POA: Diagnosis present

## 2014-01-07 DIAGNOSIS — K819 Cholecystitis, unspecified: Secondary | ICD-10-CM | POA: Insufficient documentation

## 2014-01-07 DIAGNOSIS — D649 Anemia, unspecified: Secondary | ICD-10-CM | POA: Diagnosis present

## 2014-01-07 DIAGNOSIS — J9811 Atelectasis: Secondary | ICD-10-CM | POA: Diagnosis present

## 2014-01-07 DIAGNOSIS — D638 Anemia in other chronic diseases classified elsewhere: Secondary | ICD-10-CM | POA: Diagnosis present

## 2014-01-07 DIAGNOSIS — R710 Precipitous drop in hematocrit: Secondary | ICD-10-CM | POA: Diagnosis present

## 2014-01-07 DIAGNOSIS — Z8673 Personal history of transient ischemic attack (TIA), and cerebral infarction without residual deficits: Secondary | ICD-10-CM

## 2014-01-07 DIAGNOSIS — E7800 Pure hypercholesterolemia, unspecified: Secondary | ICD-10-CM | POA: Diagnosis present

## 2014-01-07 DIAGNOSIS — K8060 Calculus of gallbladder and bile duct with cholecystitis, unspecified, without obstruction: Secondary | ICD-10-CM | POA: Diagnosis present

## 2014-01-07 DIAGNOSIS — F17210 Nicotine dependence, cigarettes, uncomplicated: Secondary | ICD-10-CM | POA: Diagnosis present

## 2014-01-07 DIAGNOSIS — R6521 Severe sepsis with septic shock: Secondary | ICD-10-CM | POA: Diagnosis present

## 2014-01-07 DIAGNOSIS — A4159 Other Gram-negative sepsis: Secondary | ICD-10-CM | POA: Diagnosis present

## 2014-01-07 DIAGNOSIS — E785 Hyperlipidemia, unspecified: Secondary | ICD-10-CM | POA: Diagnosis present

## 2014-01-07 LAB — URINALYSIS, ROUTINE W REFLEX MICROSCOPIC
Glucose, UA: NEGATIVE mg/dL
HGB URINE DIPSTICK: NEGATIVE
KETONES UR: 15 mg/dL — AB
Leukocytes, UA: NEGATIVE
Nitrite: NEGATIVE
PROTEIN: NEGATIVE mg/dL
Specific Gravity, Urine: 1.022 (ref 1.005–1.030)
UROBILINOGEN UA: 4 mg/dL — AB (ref 0.0–1.0)
pH: 6 (ref 5.0–8.0)

## 2014-01-07 LAB — I-STAT CG4 LACTIC ACID, ED: Lactic Acid, Venous: 1.13 mmol/L (ref 0.5–2.2)

## 2014-01-07 LAB — COMPREHENSIVE METABOLIC PANEL
ALT: 128 U/L — ABNORMAL HIGH (ref 0–35)
ANION GAP: 14 (ref 5–15)
AST: 281 U/L — ABNORMAL HIGH (ref 0–37)
Albumin: 3.8 g/dL (ref 3.5–5.2)
Alkaline Phosphatase: 148 U/L — ABNORMAL HIGH (ref 39–117)
BILIRUBIN TOTAL: 0.6 mg/dL (ref 0.3–1.2)
BUN: 40 mg/dL — AB (ref 6–23)
CHLORIDE: 100 meq/L (ref 96–112)
CO2: 23 meq/L (ref 19–32)
CREATININE: 1.29 mg/dL — AB (ref 0.50–1.10)
Calcium: 9.9 mg/dL (ref 8.4–10.5)
GFR, EST AFRICAN AMERICAN: 50 mL/min — AB (ref >90)
GFR, EST NON AFRICAN AMERICAN: 43 mL/min — AB (ref >90)
GLUCOSE: 144 mg/dL — AB (ref 70–99)
Potassium: 4.3 mEq/L (ref 3.7–5.3)
Sodium: 137 mEq/L (ref 137–147)
Total Protein: 8.2 g/dL (ref 6.0–8.3)

## 2014-01-07 LAB — CBC WITH DIFFERENTIAL/PLATELET
Basophils Absolute: 0 10*3/uL (ref 0.0–0.1)
Basophils Relative: 0 % (ref 0–1)
Eosinophils Absolute: 0 10*3/uL (ref 0.0–0.7)
Eosinophils Relative: 0 % (ref 0–5)
HEMATOCRIT: 32.7 % — AB (ref 36.0–46.0)
HEMOGLOBIN: 10.4 g/dL — AB (ref 12.0–15.0)
Lymphocytes Relative: 14 % (ref 12–46)
Lymphs Abs: 1.8 10*3/uL (ref 0.7–4.0)
MCH: 29.5 pg (ref 26.0–34.0)
MCHC: 31.8 g/dL (ref 30.0–36.0)
MCV: 92.6 fL (ref 78.0–100.0)
MONO ABS: 0.5 10*3/uL (ref 0.1–1.0)
MONOS PCT: 4 % (ref 3–12)
NEUTROS ABS: 10.4 10*3/uL — AB (ref 1.7–7.7)
Neutrophils Relative %: 82 % — ABNORMAL HIGH (ref 43–77)
Platelets: 218 10*3/uL (ref 150–400)
RBC: 3.53 MIL/uL — ABNORMAL LOW (ref 3.87–5.11)
RDW: 12.8 % (ref 11.5–15.5)
WBC: 12.7 10*3/uL — ABNORMAL HIGH (ref 4.0–10.5)

## 2014-01-07 LAB — LIPASE, BLOOD: LIPASE: 2320 U/L — AB (ref 11–59)

## 2014-01-07 MED ORDER — SODIUM CHLORIDE 0.9 % IV SOLN
1000.0000 mL | INTRAVENOUS | Status: DC
  Administered 2014-01-08: 1000 mL via INTRAVENOUS

## 2014-01-07 MED ORDER — ONDANSETRON 4 MG PO TBDP
8.0000 mg | ORAL_TABLET | Freq: Once | ORAL | Status: AC
  Administered 2014-01-07: 8 mg via ORAL
  Filled 2014-01-07: qty 2

## 2014-01-07 MED ORDER — SODIUM CHLORIDE 0.9 % IV SOLN
1000.0000 mL | Freq: Once | INTRAVENOUS | Status: AC
  Administered 2014-01-07: 1000 mL via INTRAVENOUS

## 2014-01-07 NOTE — ED Notes (Signed)
Pt reports nausea 

## 2014-01-07 NOTE — ED Notes (Signed)
Pt c/o vomiting twice tonight. Denies blood in vomit. Denies diarrhea, fever, chills, denies abdominal pain. Pt states she "just feels so bad from throwing up".

## 2014-01-07 NOTE — ED Notes (Signed)
Tech getting manual BP

## 2014-01-08 ENCOUNTER — Inpatient Hospital Stay (HOSPITAL_COMMUNITY): Payer: Medicare Other

## 2014-01-08 ENCOUNTER — Emergency Department (HOSPITAL_COMMUNITY): Payer: Medicare Other

## 2014-01-08 ENCOUNTER — Encounter (HOSPITAL_COMMUNITY): Payer: Self-pay | Admitting: *Deleted

## 2014-01-08 DIAGNOSIS — D638 Anemia in other chronic diseases classified elsewhere: Secondary | ICD-10-CM | POA: Diagnosis present

## 2014-01-08 DIAGNOSIS — E780 Pure hypercholesterolemia: Secondary | ICD-10-CM

## 2014-01-08 DIAGNOSIS — N183 Chronic kidney disease, stage 3 (moderate): Secondary | ICD-10-CM | POA: Diagnosis present

## 2014-01-08 DIAGNOSIS — K851 Biliary acute pancreatitis without necrosis or infection: Secondary | ICD-10-CM | POA: Diagnosis present

## 2014-01-08 DIAGNOSIS — F17210 Nicotine dependence, cigarettes, uncomplicated: Secondary | ICD-10-CM | POA: Diagnosis present

## 2014-01-08 DIAGNOSIS — I519 Heart disease, unspecified: Secondary | ICD-10-CM

## 2014-01-08 DIAGNOSIS — R5381 Other malaise: Secondary | ICD-10-CM | POA: Diagnosis present

## 2014-01-08 DIAGNOSIS — Z79899 Other long term (current) drug therapy: Secondary | ICD-10-CM | POA: Diagnosis not present

## 2014-01-08 DIAGNOSIS — N179 Acute kidney failure, unspecified: Secondary | ICD-10-CM | POA: Diagnosis present

## 2014-01-08 DIAGNOSIS — E785 Hyperlipidemia, unspecified: Secondary | ICD-10-CM | POA: Diagnosis present

## 2014-01-08 DIAGNOSIS — R0902 Hypoxemia: Secondary | ICD-10-CM | POA: Diagnosis present

## 2014-01-08 DIAGNOSIS — I10 Essential (primary) hypertension: Secondary | ICD-10-CM

## 2014-01-08 DIAGNOSIS — I129 Hypertensive chronic kidney disease with stage 1 through stage 4 chronic kidney disease, or unspecified chronic kidney disease: Secondary | ICD-10-CM | POA: Diagnosis present

## 2014-01-08 DIAGNOSIS — K859 Acute pancreatitis, unspecified: Secondary | ICD-10-CM

## 2014-01-08 DIAGNOSIS — I5033 Acute on chronic diastolic (congestive) heart failure: Secondary | ICD-10-CM | POA: Diagnosis present

## 2014-01-08 DIAGNOSIS — A4189 Other specified sepsis: Secondary | ICD-10-CM | POA: Diagnosis present

## 2014-01-08 DIAGNOSIS — A4159 Other Gram-negative sepsis: Secondary | ICD-10-CM | POA: Diagnosis present

## 2014-01-08 DIAGNOSIS — K861 Other chronic pancreatitis: Secondary | ICD-10-CM | POA: Diagnosis present

## 2014-01-08 DIAGNOSIS — Z7982 Long term (current) use of aspirin: Secondary | ICD-10-CM | POA: Diagnosis not present

## 2014-01-08 DIAGNOSIS — R710 Precipitous drop in hematocrit: Secondary | ICD-10-CM | POA: Diagnosis present

## 2014-01-08 DIAGNOSIS — J9811 Atelectasis: Secondary | ICD-10-CM | POA: Diagnosis present

## 2014-01-08 DIAGNOSIS — K8060 Calculus of gallbladder and bile duct with cholecystitis, unspecified, without obstruction: Secondary | ICD-10-CM | POA: Diagnosis present

## 2014-01-08 DIAGNOSIS — R6521 Severe sepsis with septic shock: Secondary | ICD-10-CM | POA: Diagnosis present

## 2014-01-08 DIAGNOSIS — Z8673 Personal history of transient ischemic attack (TIA), and cerebral infarction without residual deficits: Secondary | ICD-10-CM | POA: Diagnosis not present

## 2014-01-08 HISTORY — DX: Biliary acute pancreatitis without necrosis or infection: K85.10

## 2014-01-08 HISTORY — DX: Acute kidney failure, unspecified: N17.9

## 2014-01-08 LAB — HEPATIC FUNCTION PANEL
ALK PHOS: 201 U/L — AB (ref 39–117)
ALT: 363 U/L — ABNORMAL HIGH (ref 0–35)
AST: 585 U/L — AB (ref 0–37)
Albumin: 3.4 g/dL — ABNORMAL LOW (ref 3.5–5.2)
BILIRUBIN INDIRECT: 0.8 mg/dL (ref 0.3–0.9)
BILIRUBIN TOTAL: 1.5 mg/dL — AB (ref 0.3–1.2)
Bilirubin, Direct: 0.7 mg/dL — ABNORMAL HIGH (ref 0.0–0.3)
Total Protein: 7 g/dL (ref 6.0–8.3)

## 2014-01-08 LAB — CBC
HEMATOCRIT: 29.6 % — AB (ref 36.0–46.0)
HEMOGLOBIN: 9.4 g/dL — AB (ref 12.0–15.0)
MCH: 29.6 pg (ref 26.0–34.0)
MCHC: 31.8 g/dL (ref 30.0–36.0)
MCV: 93.1 fL (ref 78.0–100.0)
PLATELETS: 165 10*3/uL (ref 150–400)
RBC: 3.18 MIL/uL — AB (ref 3.87–5.11)
RDW: 12.8 % (ref 11.5–15.5)
WBC: 14.6 10*3/uL — AB (ref 4.0–10.5)

## 2014-01-08 LAB — BASIC METABOLIC PANEL
Anion gap: 12 (ref 5–15)
BUN: 38 mg/dL — AB (ref 6–23)
CO2: 24 meq/L (ref 19–32)
Calcium: 9.3 mg/dL (ref 8.4–10.5)
Chloride: 101 mEq/L (ref 96–112)
Creatinine, Ser: 1.34 mg/dL — ABNORMAL HIGH (ref 0.50–1.10)
GFR calc Af Amer: 47 mL/min — ABNORMAL LOW (ref >90)
GFR, EST NON AFRICAN AMERICAN: 41 mL/min — AB (ref >90)
GLUCOSE: 122 mg/dL — AB (ref 70–99)
POTASSIUM: 3.7 meq/L (ref 3.7–5.3)
SODIUM: 137 meq/L (ref 137–147)

## 2014-01-08 LAB — LIPASE, BLOOD: Lipase: 1099 U/L — ABNORMAL HIGH (ref 11–59)

## 2014-01-08 MED ORDER — HYDROMORPHONE HCL 1 MG/ML IJ SOLN
0.5000 mg | Freq: Once | INTRAMUSCULAR | Status: AC
  Administered 2014-01-08: 0.5 mg via INTRAVENOUS
  Filled 2014-01-08: qty 1

## 2014-01-08 MED ORDER — ACETAMINOPHEN 325 MG PO TABS: 650.0000 mg | ORAL_TABLET | Freq: Four times a day (QID) | ORAL | Status: DC | PRN

## 2014-01-08 MED ORDER — ACETAMINOPHEN 650 MG RE SUPP
650.0000 mg | Freq: Four times a day (QID) | RECTAL | Status: DC | PRN
  Administered 2014-01-08 – 2014-01-09 (×2): 650 mg via RECTAL
  Filled 2014-01-08 (×2): qty 1

## 2014-01-08 MED ORDER — PANTOPRAZOLE SODIUM 40 MG IV SOLR
40.0000 mg | Freq: Two times a day (BID) | INTRAVENOUS | Status: DC
Start: 2014-01-08 — End: 2014-01-10
  Administered 2014-01-08 – 2014-01-10 (×4): 40 mg via INTRAVENOUS
  Filled 2014-01-08 (×7): qty 40

## 2014-01-08 MED ORDER — LORAZEPAM 2 MG/ML IJ SOLN
1.0000 mg | Freq: Once | INTRAMUSCULAR | Status: AC
  Administered 2014-01-08: 1 mg via INTRAVENOUS
  Filled 2014-01-08: qty 1

## 2014-01-08 MED ORDER — SODIUM CHLORIDE 0.9 % IV SOLN
INTRAVENOUS | Status: DC
  Administered 2014-01-08: 11:00:00 via INTRAVENOUS

## 2014-01-08 MED ORDER — PIPERACILLIN-TAZOBACTAM 3.375 G IVPB
3.3750 g | Freq: Three times a day (TID) | INTRAVENOUS | Status: DC
  Administered 2014-01-08 – 2014-01-10 (×5): 3.375 g via INTRAVENOUS
  Filled 2014-01-08 (×9): qty 50

## 2014-01-08 MED ORDER — ONDANSETRON HCL 4 MG PO TABS
4.0000 mg | ORAL_TABLET | Freq: Four times a day (QID) | ORAL | Status: DC | PRN
Start: 2014-01-08 — End: 2014-01-14

## 2014-01-08 MED ORDER — GADOBENATE DIMEGLUMINE 529 MG/ML IV SOLN
10.0000 mL | Freq: Once | INTRAVENOUS | Status: AC | PRN
  Administered 2014-01-08: 10 mL via INTRAVENOUS

## 2014-01-08 MED ORDER — SODIUM CHLORIDE 0.9 % IV BOLUS (SEPSIS)
500.0000 mL | Freq: Once | INTRAVENOUS | Status: AC
  Administered 2014-01-08: 500 mL via INTRAVENOUS

## 2014-01-08 MED ORDER — SODIUM CHLORIDE 0.9 % IV SOLN
INTRAVENOUS | Status: DC
  Administered 2014-01-08 – 2014-01-09 (×2): via INTRAVENOUS

## 2014-01-08 MED ORDER — ONDANSETRON HCL 4 MG/2ML IJ SOLN
4.0000 mg | Freq: Four times a day (QID) | INTRAMUSCULAR | Status: DC | PRN
  Administered 2014-01-08: 4 mg via INTRAVENOUS
  Filled 2014-01-08: qty 2

## 2014-01-08 MED ORDER — OXYCODONE HCL 5 MG PO TABS: 5.0000 mg | ORAL_TABLET | ORAL | Status: DC | PRN

## 2014-01-08 MED ORDER — HYDROMORPHONE HCL 1 MG/ML IJ SOLN: 0.5000 mg | INTRAMUSCULAR | Status: DC | PRN

## 2014-01-08 NOTE — Plan of Care (Signed)
Problem: Phase I Progression Outcomes Goal: Initial discharge plan identified Outcome: Completed/Met Date Met:  01/08/14 Return home with family.

## 2014-01-08 NOTE — Plan of Care (Signed)
Problem: Consults Goal: Diabetes Guidelines if Diabetic/Glucose > 140 If diabetic or lab glucose is > 140 mg/dl - Initiate Diabetes/Hyperglycemia Guidelines & Document Interventions  Outcome: Not Applicable Date Met:  42/39/53

## 2014-01-08 NOTE — H&P (Signed)
[facility] Admission History and Physical       Denis Koppel DQQ:[id number] DOB: [birth year] DOA: 01/07/2014  Referring physician:  PCP: Milagros Evener, MD  Specialists:   Chief Complaint: Nausea and Vomiting  HPI: Katelyn [name] is a 63 y.o. female with a history of HTN, Hyperlipidemia who presents to the ED with complaints of Nausea and Vomiting and ABD pain that started 1 hour PTA.    She denies hematemesis and diarrhea as well as fevers and chills.   She was found to have a Lipase level of 2320.   An Ultrasound of the ABD was negative for Gallstones and a Murphy's Sign.  She was referred for admission  Review of Systems:  Constitutional: No Weight Loss, No Weight Gain, Night Sweats, Fevers, Chills, Dizziness, Fatigue, or Generalized Weakness HEENT: No Headaches, Difficulty Swallowing,Tooth/Dental Problems,Sore Throat,  No Sneezing, Rhinitis, Ear Ache, Nasal Congestion, or Post Nasal Drip,  Cardio-vascular:  No Chest pain, Orthopnea, PND, Edema in Lower Extremities, Anasarca, Dizziness, Palpitations  Resp: No Dyspnea, No DOE, No Productive Cough, No Non-Productive Cough, No Hemoptysis, No Wheezing.    GI: No Heartburn, Indigestion,+Abdominal Pain, +Nausea, +Vomiting, Diarrhea, Hematemesis, Hematochezia, Melena, Change in Bowel Habits,  Loss of Appetite  GU: No Dysuria, Change in Color of Urine, No Urgency or Frequency, No Flank pain.  Musculoskeletal: No Joint Pain or Swelling, No Decreased Range of Motion, No Back Pain.  Neurologic: No Syncope, No Seizures, Muscle Weakness, Paresthesia, Vision Disturbance or Loss, No Diplopia, No Vertigo, No Difficulty Walking,  Skin: No Rash or Lesions. Psych: No Change in Mood or Affect, No Depression or Anxiety, No Memory loss, No Confusion, or Hallucinations   Past Medical History  Diagnosis Date  . Hypertension   . Hypercholesteremia   . Shortness of breath   . Blood transfusion   . Stroke       Past Surgical  History  Procedure Laterality Date  . Abdominal hysterectomy    . Tee without cardioversion  03/06/2011    Procedure: TRANSESOPHAGEAL ECHOCARDIOGRAM (TEE);  Surgeon: Jolaine Artist, MD;  Location: [facility] ENDOSCOPY;  Service: Cardiovascular;  Laterality: N/A;       Prior to Admission medications   Medication Sig Start Date End Date Taking? Authorizing Provider  aspirin EC 81 MG tablet Take 81 mg by mouth daily.   Yes Historical Provider, MD  lisinopril-hydrochlorothiazide (PRINZIDE,ZESTORETIC) 20-25 MG per tablet Take 1 tablet by mouth daily.   Yes Historical Provider, MD  Multiple Vitamin (MULITIVITAMIN WITH MINERALS) TABS Take 1 tablet by mouth daily. 03/18/11  Yes Ivan Anchors Love, PA-C  pravastatin (PRAVACHOL) 40 MG tablet Take 2 pills at bedtime Patient taking differently: Take 40 mg by mouth daily.  03/18/11 01/07/14 Yes Ivan Anchors Love, PA-C  aspirin 325 MG tablet Take 325 mg by mouth daily.    Historical Provider, MD  lisinopril (PRINIVIL,ZESTRIL) 2.5 MG tablet Take 1 tablet (2.5 mg total) by mouth 2 (two) times daily. Patient not taking: Reported on 01/07/2014 03/18/11 03/17/12  Ivan Anchors Love, PA-C      No Known Allergies   Social History:  reports that she has quit smoking. Her smoking use included Cigarettes. She smoked 0.00 packs per day. She does not have any smokeless tobacco history on file. She reports that she does not drink alcohol or use illicit drugs.     Family History  Problem Relation Age of Onset  . Anesthesia problems Neg Hx   . Hypotension Neg Hx   . Malignant hyperthermia Neg  Hx   . Pseudochol deficiency Neg Hx   . Cancer Maternal Aunt   . Diabetes Mellitus I Father   . Diabetes Mellitus I Sister        Physical Exam:  GEN:  Pleasant Well Nourished and Well Developed  63 y.o. African American female examined and in no acute distress; cooperative with exam Filed Vitals:   01/08/14 0300 01/08/14 0437 01/08/14 0438 01/08/14 0442  BP: 109/67 106/74  106/74    Pulse: 104  103 105  Temp:    98.7 F (37.1 C)  TempSrc:    Oral  Resp:  17 21 21   SpO2: 96%  98% 96%   Blood pressure 106/74, pulse 105, temperature 98.7 F (37.1 C), temperature source Oral, resp. rate 21, SpO2 96 %. PSYCH: She is alert and oriented x4; does not appear anxious does not appear depressed; affect is normal HEENT: Normocephalic and Atraumatic, Mucous membranes pink; PERRLA; EOM intact; Fundi:  Benign;  No scleral icterus, Nares: Patent, Oropharynx: Clear, Fair Dentition,    Neck:  FROM, No Cervical Lymphadenopathy nor Thyromegaly or Carotid Bruit; No JVD; Breasts:: Not examined CHEST WALL: No tenderness CHEST: Normal respiration, clear to auscultation bilaterally HEART: Regular rate and rhythm; no murmurs rubs or gallops BACK: No kyphosis or scoliosis; No CVA tenderness ABDOMEN: Positive Bowel Sounds, Soft Non-Tender; No Masses, No Organomegaly. Rectal Exam: Not done EXTREMITIES: No  Cyanosis, Clubbing, or Edema; No Ulcerations. Genitalia: not examined PULSES: 2+ and symmetric SKIN: Normal hydration no rash or ulceration CNS:  Alert and Oriented x 4, No Focal Deficits  Vascular: pulses palpable throughout    Labs on Admission:  Basic Metabolic Panel:  Recent Labs Lab 01/07/14 2252  NA 137  K 4.3  CL 100  CO2 23  GLUCOSE 144*  BUN 40*  CREATININE 1.29*  CALCIUM 9.9   Liver Function Tests:  Recent Labs Lab 01/07/14 2252  AST 281*  ALT 128*  ALKPHOS 148*  BILITOT 0.6  PROT 8.2  ALBUMIN 3.8    Recent Labs Lab 01/07/14 2252  LIPASE 2320*   No results for input(s): AMMONIA in the last 168 hours. CBC:  Recent Labs Lab 01/07/14 2252  WBC 12.7*  NEUTROABS 10.4*  HGB 10.4*  HCT 32.7*  MCV 92.6  PLT 218   Cardiac Enzymes: No results for input(s): CKTOTAL, CKMB, CKMBINDEX, TROPONINI in the last 168 hours.  BNP (last 3 results) No results for input(s): PROBNP in the last 8760 hours. CBG: No results for input(s): GLUCAP in the last 168  hours.  Radiological Exams on Admission: US Abdomen Limited  01/08/2014   CLINICAL DATA:  Nnausea, vomiting, pancreatitis  EXAM: US ABDOMEN LIMITED - RIGHT UPPER QUADRANT  COMPARISON:  None.  FINDINGS: Gallbladder:  No gallstones or wall thickening visualized. No sonographic Murphy sign noted.  Common bile duct:  Diameter: 6.8 mm  Liver:  No focal lesion identified. Within normal limits in parenchymal echogenicity.  IMPRESSION: Normal right upper quadrant ultrasound.   Electronically Signed   By: Kathreen Devoid   On: 01/08/2014 03:35      Assessment/Plan:   63 y.o. female with   Principal Problem:   1.   Acute pancreatitis/ Acute gallstone pancreatitis   Anti-emetics and Pain control PRN   Clear Liquids as tolerated   Monitor Lipase Levels and LFTs   GI consult for MRCP or ERCP   Active Problems:   2.   Essential hypertension   Monitor BPS   IV Hydralazine PRN  Holding oral Anti-Hypertensives    3.    Hypercholesteremia   Holding Pravastatin Rx     4.   Diastolic dysfunction   Caution to Prevent Fluid Overload     5.   CKD (chronic kidney disease) stage 3, GFR 30-59 ml/min   Monitor BUN/Cr     6.   AKI (acute kidney injury)   IVFs   Monitor BUN/Cr    7.   DVT Prophylaxis   SCDs      Code Status:      FULL CODE Family Communication:    No Family present Disposition Plan:       Inpatient  Time spent:  [place] [facility] Pager [phone]   If [place] Please Contact the Day Rounding Team MD for [facility]  If 7PM-7AM, Please Contact Night-Floor Coverage  [email or web address] Password [email or web address] 01/08/2014, 4:51 AM

## 2014-01-08 NOTE — Plan of Care (Signed)
Problem: Phase I Progression Outcomes Goal: Voiding-avoid urinary catheter unless indicated Outcome: Not Applicable Date Met:  59/74/71 No difficulty voiding.

## 2014-01-08 NOTE — Progress Notes (Signed)
PROGRESS NOTE  Katelyn [name] ASN:[id number] DOB: 07-Jun-1950 DOA: 01/07/2014 PCP: Milagros Evener, MD  HPI/Recap of past 24 hours: Patient is a 63 year old female with past medical history of diastolic dysfunction and hypertension who was admitted on the early morning hours of 11/15 for midepigastric abdominal pain plus nausea and vomiting that started the night prior. Lab work on admission noted for mild acute renal failure, mild transaminitis with an AST of 281 and a lipase level of 2320. Patient admitted to the hospitalist service for presumed pancreatitis.  Initial abdominal ultrasound unrevealing.  Patient this morning feeling a little bit better. Much less pain than last night. Tolerating clear liquids.  Assessment/Plan: Principal Problem:   Acute gallstone pancreatitis: follow-up labs later this morning note improvement in lipase and patient tolerating clear liquids. However, alkaline Phos up to 200, transaminase is worse with AST going up to 585 and white blood cell count mildly increasing to 14.6.  Seen by GI and was lipase getting better, no signs of acute obstruction. Checking MRCP. Active Problems:   Hypercholesteremia: Holding statin   Essential hypertension: Blood pressures stable  History of CVA with no residual deficits: Noted    Acute renal failure in the setting of CKD (chronic kidney disease) stage 3, GFR 30-59 ml/min:increased IV fluids. Creatinine mildly elevated from last night. Continue to monitor    Diastolic dysfunction: Seen on previous echo. Patient already on ACE inhibitor. Check BNP in the morning  Anemia: Unclear etiology with normal MCV. Mild drop since yesterday which may be from initial hemoconcentration. Maybe chronic disease, we'll continue to follow, no evidence of blood loss  Code Status: full code  Family Communication: left message with family  Disposition Plan: home once issues resolved and tolerating by  mouth   Consultants:  gastroenterology-[facility]  Procedures:  MRCP planned  Antibiotics:  none   Objective: BP 104/66 mmHg  Pulse 90  Temp(Src) 97.8 F (36.6 C) (Oral)  Resp 20  Ht 4\' 6"  (1.372 m)  Wt 48.8 kg (107 lb 9.4 oz)  BMI 25.92 kg/m2  SpO2 100%  Intake/Output Summary (Last 24 hours) at 01/08/14 1344 Last data filed at 01/08/14 1239  Gross per 24 hour  Intake 2564.58 ml  Output    100 ml  Net 2464.58 ml   Filed Weights   01/08/14 0534  Weight: 48.8 kg (107 lb 9.4 oz)    Exam:   General:  Alert and oriented 3, no acute distress  Cardiovascular: regular rate and rhythm, S1-S2  Respiratory: clear to auscultation bilaterally  Abdomen: soft, mild soreness in the midepigastric region, nondistended, positive bowel sounds  Musculoskeletal: clubbing or cyanosis or edema   Data Reviewed: Basic Metabolic Panel:  Recent Labs Lab 01/07/14 2252 01/08/14 0700  NA 137 137  K 4.3 3.7  CL 100 101  CO2 23 24  GLUCOSE 144* 122*  BUN 40* 38*  CREATININE 1.29* 1.34*  CALCIUM 9.9 9.3   Liver Function Tests:  Recent Labs Lab 01/07/14 2252 01/08/14 0700  AST 281* 585*  ALT 128* 363*  ALKPHOS 148* 201*  BILITOT 0.6 1.5*  PROT 8.2 7.0  ALBUMIN 3.8 3.4*    Recent Labs Lab 01/07/14 2252 01/08/14 0700  LIPASE 2320* 1099*   No results for input(s): AMMONIA in the last 168 hours. CBC:  Recent Labs Lab 01/07/14 2252 01/08/14 0700  WBC 12.7* 14.6*  NEUTROABS 10.4*  --   HGB 10.4* 9.4*  HCT 32.7* 29.6*  MCV 92.6 93.1  PLT 218 165  Cardiac Enzymes:   No results for input(s): CKTOTAL, CKMB, CKMBINDEX, TROPONINI in the last 168 hours. BNP (last 3 results) No results for input(s): PROBNP in the last 8760 hours. CBG: No results for input(s): GLUCAP in the last 168 hours.  No results found for this or any previous visit (from the past 240 hour(s)).   Studies: No results found.  Scheduled Meds: . pantoprazole (PROTONIX) IV  40 mg  Intravenous Q12H    Continuous Infusions: . sodium chloride 100 mL/hr at 01/08/14 1110     Time spent: 25 minutes  [facility] Pager [phone]. If 7PM-7AM, please contact night-coverage at [email or web address], password [facility] 01/08/2014, 1:44 PM  LOS: 1 day

## 2014-01-08 NOTE — Progress Notes (Signed)
ANTIBIOTIC CONSULT NOTE - INITIAL  Pharmacy Consult for Zosyn Indication: Abdominal infection  No Known Allergies  Patient Measurements: Height: 4\' 6"  (137.2 cm) Weight: 107 lb 9.4 oz (48.8 kg) IBW/kg (Calculated) : 31.7  Vital Signs: Temp: 103.1 F (39.5 C) (11/15 1745) Temp Source: Oral (11/15 1745) BP: 98/53 mmHg (11/15 1745) Pulse Rate: 138 (11/15 1745) Intake/Output from previous day: 11/14 0701 - 11/15 0700 In: 2444.6 [I.V.:2444.6] Out: 100 [Urine:100] Intake/Output from this shift: Total I/O In: 120 [P.O.:120] Out: 200 [Urine:200]  Labs:  Recent Labs  01/07/14 2252 01/08/14 0700  WBC 12.7* 14.6*  HGB 10.4* 9.4*  PLT 218 165  CREATININE 1.29* 1.34*   Estimated Creatinine Clearance: 25.8 mL/min (by C-G formula based on Cr of 1.34). No results for input(s): VANCOTROUGH, VANCOPEAK, VANCORANDOM, GENTTROUGH, GENTPEAK, GENTRANDOM, TOBRATROUGH, TOBRAPEAK, TOBRARND, AMIKACINPEAK, AMIKACINTROU, AMIKACIN in the last 72 hours.   Microbiology: No results found for this or any previous visit (from the past 720 hour(s)).  Medical History: Past Medical History  Diagnosis Date  . Hypertension   . Hypercholesteremia   . Shortness of breath   . Blood transfusion   . Stroke     Medications:  Prescriptions prior to admission  Medication Sig Dispense Refill Last Dose  . aspirin EC 81 MG tablet Take 81 mg by mouth daily.   01/07/2014 at Unknown time  . lisinopril-hydrochlorothiazide (PRINZIDE,ZESTORETIC) 20-25 MG per tablet Take 1 tablet by mouth daily.   01/07/2014 at Unknown time  . Multiple Vitamin (MULITIVITAMIN WITH MINERALS) TABS Take 1 tablet by mouth daily.   01/07/2014 at Unknown time  . pravastatin (PRAVACHOL) 40 MG tablet Take 2 pills at bedtime (Patient taking differently: Take 40 mg by mouth daily. ) 60 tablet 1 01/07/2014 at Unknown time  . aspirin 325 MG tablet Take 325 mg by mouth daily.   Taking  . lisinopril (PRINIVIL,ZESTRIL) 2.5 MG tablet Take 1  tablet (2.5 mg total) by mouth 2 (two) times daily. (Patient not taking: Reported on 01/07/2014) 60 tablet 1 Not Taking   Assessment: 63 yo F admitted 01/07/2014 with abdominal pain and elevated liver enzymes.  Pharmacy consulted to dose zosyn on 11/15 with evolving fever and lethargy.  Renal: CrCl 25-30 ml/min  Goal of Therapy:  Renal adjustment of antibiotics.  Plan:  Zosyn 3.375g IV q8h infuse over 4h Follow up SCr, UOP, cultures, clinical course and adjust as clinically indicated.   Thank you for allowing pharmacy to be a part of this patients care team.  Rowe Robert Pharm.D., BCPS, AQ-Cardiology Clinical Pharmacist 01/08/2014 6:14 PM Pager: [phone] Phone: [phone]

## 2014-01-08 NOTE — Consult Note (Signed)
Consult for [facility]  Reason for Consult: ? Choledocholithiasis and elevated liver enzymes. Referring Physician: [facility]  [name] HPI: This is a 63 year old female with a PMH of HTN and hyperlipidemia who is admitted for gallstone pancreatitis.  Her symptoms started rather suddenly.  She is a soft spoken person and drowsy from phenergan at the time of the interview.  Further work up in the hospital revealed elevated liver enzymes and pancreatitis.  No overt evidence of gallstones, but the CBD is 6 mm.  No evidence of cholecystitis.  Despite the worsening of her laboratory values, she reports feeling better.  Past Medical History  Diagnosis Date  . Hypertension   . Hypercholesteremia   . Shortness of breath   . Blood transfusion   . Stroke     Past Surgical History  Procedure Laterality Date  . Abdominal hysterectomy    . Tee without cardioversion  03/06/2011    Procedure: TRANSESOPHAGEAL ECHOCARDIOGRAM (TEE);  Surgeon: Jolaine Artist, MD;  Location: [facility] ENDOSCOPY;  Service: Cardiovascular;  Laterality: N/A;    Family History  Problem Relation Age of Onset  . Anesthesia problems Neg Hx   . Hypotension Neg Hx   . Malignant hyperthermia Neg Hx   . Pseudochol deficiency Neg Hx   . Cancer Maternal Aunt   . Diabetes Mellitus I Father   . Diabetes Mellitus I Sister     Social History:  reports that she has been smoking Cigarettes.  She has been smoking about 0.25 packs per day. She does not have any smokeless tobacco history on file. She reports that she does not drink alcohol or use illicit drugs.  Allergies: No Known Allergies  Medications:  Scheduled: . pantoprazole (PROTONIX) IV  40 mg Intravenous Q12H   Continuous: . sodium chloride 100 mL/hr at 01/08/14 1110    Results for orders placed or performed during the hospital encounter of 01/07/14 (from the past 24 hour(s))  CBC with Differential     Status: Abnormal   Collection Time: 01/07/14 10:52  PM  Result Value Ref Range   WBC 12.7 (H) 4.0 - 10.5 K/uL   RBC 3.53 (L) 3.87 - 5.11 MIL/uL   Hemoglobin 10.4 (L) 12.0 - 15.0 g/dL   HCT 32.7 (L) 36.0 - 46.0 %   MCV 92.6 78.0 - 100.0 fL   MCH 29.5 26.0 - 34.0 pg   MCHC 31.8 30.0 - 36.0 g/dL   RDW 12.8 11.5 - 15.5 %   Platelets 218 150 - 400 K/uL   Neutrophils Relative % 82 (H) 43 - 77 %   Neutro Abs 10.4 (H) 1.7 - 7.7 K/uL   Lymphocytes Relative 14 12 - 46 %   Lymphs Abs 1.8 0.7 - 4.0 K/uL   Monocytes Relative 4 3 - 12 %   Monocytes Absolute 0.5 0.1 - 1.0 K/uL   Eosinophils Relative 0 0 - 5 %   Eosinophils Absolute 0.0 0.0 - 0.7 K/uL   Basophils Relative 0 0 - 1 %   Basophils Absolute 0.0 0.0 - 0.1 K/uL  Comprehensive metabolic panel     Status: Abnormal   Collection Time: 01/07/14 10:52 PM  Result Value Ref Range   Sodium 137 137 - 147 mEq/L   Potassium 4.3 3.7 - 5.3 mEq/L   Chloride 100 96 - 112 mEq/L   CO2 23 19 - 32 mEq/L   Glucose, Bld 144 (H) 70 - 99 mg/dL   BUN 40 (H) 6 - 23 mg/dL  Creatinine, Ser 1.29 (H) 0.50 - 1.10 mg/dL   Calcium 9.9 8.4 - 10.5 mg/dL   Total Protein 8.2 6.0 - 8.3 g/dL   Albumin 3.8 3.5 - 5.2 g/dL   AST 281 (H) 0 - 37 U/L   ALT 128 (H) 0 - 35 U/L   Alkaline Phosphatase 148 (H) 39 - 117 U/L   Total Bilirubin 0.6 0.3 - 1.2 mg/dL   GFR calc non Af Amer 43 (L) >90 mL/min   GFR calc Af Amer 50 (L) >90 mL/min   Anion gap 14 5 - 15  Lipase, blood     Status: Abnormal   Collection Time: 01/07/14 10:52 PM  Result Value Ref Range   Lipase 2320 (H) 11 - 59 U/L  I-Stat CG4 Lactic Acid, ED     Status: None   Collection Time: 01/07/14 11:07 PM  Result Value Ref Range   Lactic Acid, Venous 1.13 0.5 - 2.2 mmol/L  Urinalysis, Routine w reflex microscopic     Status: Abnormal   Collection Time: 01/07/14 11:23 PM  Result Value Ref Range   Color, Urine AMBER (A) YELLOW   APPearance CLOUDY (A) CLEAR   Specific Gravity, Urine 1.022 1.005 - 1.030   pH 6.0 5.0 - 8.0   Glucose, UA NEGATIVE NEGATIVE mg/dL    Hgb urine dipstick NEGATIVE NEGATIVE   Bilirubin Urine SMALL (A) NEGATIVE   Ketones, ur 15 (A) NEGATIVE mg/dL   Protein, ur NEGATIVE NEGATIVE mg/dL   Urobilinogen, UA 4.0 (H) 0.0 - 1.0 mg/dL   Nitrite NEGATIVE NEGATIVE   Leukocytes, UA NEGATIVE NEGATIVE  Basic metabolic panel     Status: Abnormal   Collection Time: 01/08/14  7:00 AM  Result Value Ref Range   Sodium 137 137 - 147 mEq/L   Potassium 3.7 3.7 - 5.3 mEq/L   Chloride 101 96 - 112 mEq/L   CO2 24 19 - 32 mEq/L   Glucose, Bld 122 (H) 70 - 99 mg/dL   BUN 38 (H) 6 - 23 mg/dL   Creatinine, Ser 1.34 (H) 0.50 - 1.10 mg/dL   Calcium 9.3 8.4 - 10.5 mg/dL   GFR calc non Af Amer 41 (L) >90 mL/min   GFR calc Af Amer 47 (L) >90 mL/min   Anion gap 12 5 - 15  CBC     Status: Abnormal   Collection Time: 01/08/14  7:00 AM  Result Value Ref Range   WBC 14.6 (H) 4.0 - 10.5 K/uL   RBC 3.18 (L) 3.87 - 5.11 MIL/uL   Hemoglobin 9.4 (L) 12.0 - 15.0 g/dL   HCT 29.6 (L) 36.0 - 46.0 %   MCV 93.1 78.0 - 100.0 fL   MCH 29.6 26.0 - 34.0 pg   MCHC 31.8 30.0 - 36.0 g/dL   RDW 12.8 11.5 - 15.5 %   Platelets 165 150 - 400 K/uL  Hepatic function panel     Status: Abnormal   Collection Time: 01/08/14  7:00 AM  Result Value Ref Range   Total Protein 7.0 6.0 - 8.3 g/dL   Albumin 3.4 (L) 3.5 - 5.2 g/dL   AST 585 (H) 0 - 37 U/L   ALT 363 (H) 0 - 35 U/L   Alkaline Phosphatase 201 (H) 39 - 117 U/L   Total Bilirubin 1.5 (H) 0.3 - 1.2 mg/dL   Bilirubin, Direct 0.7 (H) 0.0 - 0.3 mg/dL   Indirect Bilirubin 0.8 0.3 - 0.9 mg/dL  Lipase, blood     Status: Abnormal  Collection Time: 01/08/14  7:00 AM  Result Value Ref Range   Lipase 1099 (H) 11 - 59 U/L     US Abdomen Limited  01/08/2014   CLINICAL DATA:  Nnausea, vomiting, pancreatitis  EXAM: US ABDOMEN LIMITED - RIGHT UPPER QUADRANT  COMPARISON:  None.  FINDINGS: Gallbladder:  No gallstones or wall thickening visualized. No sonographic Murphy sign noted.  Common bile duct:  Diameter: 6.8 mm  Liver:  No  focal lesion identified. Within normal limits in parenchymal echogenicity.  IMPRESSION: Normal right upper quadrant ultrasound.   Electronically Signed   By: Kathreen Devoid   On: 01/08/2014 03:35    ROS:  As stated above in the HPI otherwise negative.  Blood pressure 104/66, pulse 90, temperature 97.8 F (36.6 C), temperature source Oral, resp. rate 20, height 4\' 6"  (1.372 m), weight 48.8 kg (107 lb 9.4 oz), SpO2 100 %.    PE: Gen: NAD, Alert and Oriented HEENT:  [place]/AT, EOMI Neck: Supple, no LAD Lungs: CTA Bilaterally CV: RRR without M/G/R ABM: Soft, NTND, +BS Ext: No C/C/E  Assessment/Plan: 1) Probable gallstone pancreatitis. 2) Abnormal liver enzymes. 3) HTN. 4) Hyperlipidemia.   The liver enzyme pattern appears to be in an obstructive pattern.  No evidence of any stones with ultrasound imaging.  Her increase WBC can be a manifestation of her pancreatitis and it should improve.  I am uncertain if the elevated liver enzymes reflect a retained stone.    Plan: 1) MRCP. 2) Supportive care.  Shere Eisenhart D 01/08/2014, 1:22 PM

## 2014-01-08 NOTE — Progress Notes (Signed)
Pt temp 101. 3. Recheck of temp 103.1. Resp 28. Pt lethargic, but arousable with turning. Acetaminophen supp given per MD order. MD paged. Rapid response paged. Will continue to monitor. Manya Silvas, RN

## 2014-01-08 NOTE — Plan of Care (Signed)
Problem: Consults Goal: Skin Care Protocol Initiated - if Braden Score 18 or less If consults are not indicated, leave blank or document N/A  Outcome: Not Applicable Date Met:  41/32/44

## 2014-01-08 NOTE — Progress Notes (Signed)
Pt bp down to 82/51. Recheck 77/51. MD paged. Will continue to monitor. Manya Silvas, RN

## 2014-01-08 NOTE — Progress Notes (Signed)
ED RN called. Room ready for admit.

## 2014-01-08 NOTE — ED Notes (Signed)
Patient to US

## 2014-01-08 NOTE — Plan of Care (Signed)
Problem: Phase I Progression Outcomes Goal: Hemodynamically stable Outcome: Completed/Met Date Met:  01/08/14 VSS.      

## 2014-01-08 NOTE — ED Provider Notes (Signed)
CSN: [id number]     Arrival date & time 01/07/14  2203 History   First MD Initiated Contact with Patient 01/07/14 2321     Chief Complaint  Patient presents with  . Emesis  . Nausea     (Consider location/radiation/quality/duration/timing/severity/associated sxs/prior Treatment) HPI 63 year old female presents to the emergency department from home with complaint of nausea and vomiting this evening.  Patient reports that she feels achy and bad secondary to throwing up.  She denies any unusual foods, no sick contacts no travel no well water.  Patient reports she has been healthy since having a stroke 2 years ago.  Patient was hypotensive upon arrival improved after IV fluids Past Medical History  Diagnosis Date  . Hypertension   . Hypercholesteremia   . Shortness of breath   . Blood transfusion   . Stroke    Past Surgical History  Procedure Laterality Date  . Abdominal hysterectomy    . Tee without cardioversion  03/06/2011    Procedure: TRANSESOPHAGEAL ECHOCARDIOGRAM (TEE);  Surgeon: Jolaine Artist, MD;  Location: [facility] ENDOSCOPY;  Service: Cardiovascular;  Laterality: N/A;   Family History  Problem Relation Age of Onset  . Anesthesia problems Neg Hx   . Hypotension Neg Hx   . Malignant hyperthermia Neg Hx   . Pseudochol deficiency Neg Hx   . Cancer Maternal Aunt   . Diabetes Mellitus I Father   . Diabetes Mellitus I Sister    History  Substance Use Topics  . Smoking status: Current Every Day Smoker -- 0.25 packs/day    Types: Cigarettes  . Smokeless tobacco: Not on file  . Alcohol Use: No   OB History    No data available     Review of Systems   See History of Present Illness; otherwise all other systems are reviewed and negative  Allergies  Review of patient's allergies indicates no known allergies.  Home Medications   Prior to Admission medications   Medication Sig Start Date End Date Taking? Authorizing Provider  aspirin EC 81 MG tablet Take 81 mg by  mouth daily.   Yes Historical Provider, MD  lisinopril-hydrochlorothiazide (PRINZIDE,ZESTORETIC) 20-25 MG per tablet Take 1 tablet by mouth daily.   Yes Historical Provider, MD  Multiple Vitamin (MULITIVITAMIN WITH MINERALS) TABS Take 1 tablet by mouth daily. 03/18/11  Yes Ivan Anchors Love, PA-C  pravastatin (PRAVACHOL) 40 MG tablet Take 2 pills at bedtime Patient taking differently: Take 40 mg by mouth daily.  03/18/11 01/07/14 Yes Ivan Anchors Love, PA-C  aspirin 325 MG tablet Take 325 mg by mouth daily.    Historical Provider, MD  lisinopril (PRINIVIL,ZESTRIL) 2.5 MG tablet Take 1 tablet (2.5 mg total) by mouth 2 (two) times daily. Patient not taking: Reported on 01/07/2014 03/18/11 03/17/12  Ivan Anchors Love, PA-C   BP 99/63 mmHg  Pulse 100  Temp(Src) 99.7 F (37.6 C) (Oral)  Resp 19  Ht 4\' 6"  (1.372 m)  Wt 107 lb 9.4 oz (48.8 kg)  BMI 25.92 kg/m2  SpO2 92% Physical Exam  Constitutional: She is oriented to person, place, and time. She appears well-developed and well-nourished.  HENT:  Head: Normocephalic and atraumatic.  Nose: Nose normal.  Mouth/Throat: Oropharynx is clear and moist.  Eyes: Conjunctivae and EOM are normal. Pupils are equal, round, and reactive to light.  Neck: Normal range of motion. Neck supple. No JVD present. No tracheal deviation present. No thyromegaly present.  Cardiovascular: Normal rate, regular rhythm, normal heart sounds and intact distal pulses.  Exam reveals no gallop and no friction rub.   No murmur heard. Pulmonary/Chest: Effort normal and breath sounds normal. No stridor. No respiratory distress. She has no wheezes. She has no rales. She exhibits no tenderness.  Abdominal: Soft. Bowel sounds are normal. She exhibits no distension and no mass. There is tenderness (mild epigastric tenderness). There is no rebound and no guarding.  Musculoskeletal: Normal range of motion. She exhibits no edema or tenderness.  Lymphadenopathy:    She has no cervical adenopathy.   Neurological: She is alert and oriented to person, place, and time. She displays normal reflexes. She exhibits normal muscle tone. Coordination normal.  Skin: Skin is warm and dry. No rash noted. No erythema. No pallor.  Psychiatric: She has a normal mood and affect. Her behavior is normal. Judgment and thought content normal.  Nursing note and vitals reviewed.   ED Course  Procedures (including critical care time) Labs Review Labs Reviewed  CBC WITH DIFFERENTIAL - Abnormal; Notable for the following:    WBC 12.7 (*)    RBC 3.53 (*)    Hemoglobin 10.4 (*)    HCT 32.7 (*)    Neutrophils Relative % 82 (*)    Neutro Abs 10.4 (*)    All other components within normal limits  COMPREHENSIVE METABOLIC PANEL - Abnormal; Notable for the following:    Glucose, Bld 144 (*)    BUN 40 (*)    Creatinine, Ser 1.29 (*)    AST 281 (*)    ALT 128 (*)    Alkaline Phosphatase 148 (*)    GFR calc non Af Amer 43 (*)    GFR calc Af Amer 50 (*)    All other components within normal limits  LIPASE, BLOOD - Abnormal; Notable for the following:    Lipase 2320 (*)    All other components within normal limits  URINALYSIS, ROUTINE W REFLEX MICROSCOPIC - Abnormal; Notable for the following:    Color, Urine AMBER (*)    APPearance CLOUDY (*)    Bilirubin Urine SMALL (*)    Ketones, ur 15 (*)    Urobilinogen, UA 4.0 (*)    All other components within normal limits  BASIC METABOLIC PANEL - Abnormal; Notable for the following:    Glucose, Bld 122 (*)    BUN 38 (*)    Creatinine, Ser 1.34 (*)    GFR calc non Af Amer 41 (*)    GFR calc Af Amer 47 (*)    All other components within normal limits  CBC - Abnormal; Notable for the following:    WBC 14.6 (*)    RBC 3.18 (*)    Hemoglobin 9.4 (*)    HCT 29.6 (*)    All other components within normal limits  I-STAT CG4 LACTIC ACID, ED    Imaging Review US Abdomen Limited  01/08/2014   CLINICAL DATA:  Nnausea, vomiting, pancreatitis  EXAM: US ABDOMEN  LIMITED - RIGHT UPPER QUADRANT  COMPARISON:  None.  FINDINGS: Gallbladder:  No gallstones or wall thickening visualized. No sonographic Murphy sign noted.  Common bile duct:  Diameter: 6.8 mm  Liver:  No focal lesion identified. Within normal limits in parenchymal echogenicity.  IMPRESSION: Normal right upper quadrant ultrasound.   Electronically Signed   By: Kathreen Devoid   On: 01/08/2014 03:35     EKG Interpretation None      MDM   Final diagnoses:  Acute gallstone pancreatitis    63 year old female with nausea and  vomiting and epigastric pain.  Labs show elevation in LFTs and lipase.  This is most consistent with gallstone pancreatitis.  Ultrasound without acute cholecystitis.  Discussed with hospitalist who will admit.  Patient has been very stable here in the emergency department after IV fluids, no complaints of pain no further vomiting.    Kalman Drape, MD 01/08/14 [phone]

## 2014-01-08 NOTE — Plan of Care (Signed)
Problem: Phase I Progression Outcomes Goal: OOB as tolerated unless otherwise ordered Outcome: Completed/Met Date Met:  01/08/14 1 assist to bsc.

## 2014-01-08 NOTE — Plan of Care (Signed)
Problem: Phase I Progression Outcomes Goal: Pain controlled with appropriate interventions Outcome: Completed/Met Date Met:  01/08/14 Denying pain at this time.

## 2014-01-09 ENCOUNTER — Inpatient Hospital Stay (HOSPITAL_COMMUNITY): Payer: Medicare Other

## 2014-01-09 DIAGNOSIS — D649 Anemia, unspecified: Secondary | ICD-10-CM

## 2014-01-09 DIAGNOSIS — A419 Sepsis, unspecified organism: Secondary | ICD-10-CM

## 2014-01-09 DIAGNOSIS — I5033 Acute on chronic diastolic (congestive) heart failure: Secondary | ICD-10-CM

## 2014-01-09 DIAGNOSIS — R0902 Hypoxemia: Secondary | ICD-10-CM

## 2014-01-09 DIAGNOSIS — K819 Cholecystitis, unspecified: Secondary | ICD-10-CM | POA: Insufficient documentation

## 2014-01-09 DIAGNOSIS — R6521 Severe sepsis with septic shock: Secondary | ICD-10-CM

## 2014-01-09 HISTORY — DX: Sepsis, unspecified organism: A41.9

## 2014-01-09 LAB — CBC
HCT: 26.7 % — ABNORMAL LOW (ref 36.0–46.0)
HEMATOCRIT: 30.2 % — AB (ref 36.0–46.0)
HEMOGLOBIN: 8.6 g/dL — AB (ref 12.0–15.0)
HEMOGLOBIN: 9.7 g/dL — AB (ref 12.0–15.0)
MCH: 29.8 pg (ref 26.0–34.0)
MCH: 29.6 pg (ref 26.0–34.0)
MCHC: 32.2 g/dL (ref 30.0–36.0)
MCHC: 32.1 g/dL (ref 30.0–36.0)
MCV: 92.4 fL (ref 78.0–100.0)
MCV: 92.1 fL (ref 78.0–100.0)
PLATELETS: 113 10*3/uL — AB (ref 150–400)
Platelets: 111 10*3/uL — ABNORMAL LOW (ref 150–400)
RBC: 2.89 MIL/uL — AB (ref 3.87–5.11)
RBC: 3.28 MIL/uL — ABNORMAL LOW (ref 3.87–5.11)
RDW: 13.2 % (ref 11.5–15.5)
RDW: 13.1 % (ref 11.5–15.5)
WBC: 21.3 10*3/uL — ABNORMAL HIGH (ref 4.0–10.5)
WBC: 8.7 10*3/uL (ref 4.0–10.5)

## 2014-01-09 LAB — COMPREHENSIVE METABOLIC PANEL
ALT: 229 U/L — AB (ref 0–35)
ANION GAP: 16 — AB (ref 5–15)
AST: 215 U/L — ABNORMAL HIGH (ref 0–37)
Albumin: 2.5 g/dL — ABNORMAL LOW (ref 3.5–5.2)
Alkaline Phosphatase: 161 U/L — ABNORMAL HIGH (ref 39–117)
BUN: 42 mg/dL — AB (ref 6–23)
CALCIUM: 8.4 mg/dL (ref 8.4–10.5)
CO2: 18 mEq/L — ABNORMAL LOW (ref 19–32)
CREATININE: 1.92 mg/dL — AB (ref 0.50–1.10)
Chloride: 109 mEq/L (ref 96–112)
GFR, EST AFRICAN AMERICAN: 31 mL/min — AB (ref >90)
GFR, EST NON AFRICAN AMERICAN: 26 mL/min — AB (ref >90)
Glucose, Bld: 87 mg/dL (ref 70–99)
Potassium: 4.2 mEq/L (ref 3.7–5.3)
Sodium: 143 mEq/L (ref 137–147)
TOTAL PROTEIN: 6 g/dL (ref 6.0–8.3)
Total Bilirubin: 1.8 mg/dL — ABNORMAL HIGH (ref 0.3–1.2)

## 2014-01-09 LAB — MRSA PCR SCREENING: MRSA by PCR: NEGATIVE

## 2014-01-09 LAB — LIPASE, BLOOD: Lipase: 50 U/L (ref 11–59)

## 2014-01-09 LAB — PRO B NATRIURETIC PEPTIDE: Pro B Natriuretic peptide (BNP): 5933 pg/mL — ABNORMAL HIGH (ref 0–125)

## 2014-01-09 MED ORDER — KCL IN DEXTROSE-NACL 10-5-0.45 MEQ/L-%-% IV SOLN
INTRAVENOUS | Status: DC
  Administered 2014-01-09 – 2014-01-11 (×2): via INTRAVENOUS
  Administered 2014-01-12: 50 mL/h via INTRAVENOUS
  Administered 2014-01-13: 17:00:00 via INTRAVENOUS
  Filled 2014-01-09 (×13): qty 1000

## 2014-01-09 MED ORDER — CETYLPYRIDINIUM CHLORIDE 0.05 % MT LIQD
7.0000 mL | Freq: Two times a day (BID) | OROMUCOSAL | Status: DC
  Administered 2014-01-09 – 2014-01-14 (×10): 7 mL via OROMUCOSAL

## 2014-01-09 MED ORDER — TECHNETIUM TC 99M MEBROFENIN IV KIT
5.0000 | PACK | Freq: Once | INTRAVENOUS | Status: AC | PRN
  Administered 2014-01-09: 5 via INTRAVENOUS

## 2014-01-09 MED ORDER — IOHEXOL 300 MG/ML  SOLN
25.0000 mL | INTRAMUSCULAR | Status: AC
  Administered 2014-01-09 (×2): 25 mL via ORAL

## 2014-01-09 MED ORDER — MORPHINE SULFATE 4 MG/ML IJ SOLN
INTRAMUSCULAR | Status: AC
  Administered 2014-01-09: 2 mg via INTRAMUSCULAR
  Filled 2014-01-09: qty 1

## 2014-01-09 NOTE — Progress Notes (Signed)
Patient transferred to 2C05. Report given to [facility]. Family members accompanied patient to unit. Patient stable upon transfer.

## 2014-01-09 NOTE — Progress Notes (Signed)
Patient ID: [name], female   DOB: [birth year], 63 y.o.   MRN: [id number]  [facility] Progress Note  Subjective: Pt says she feels better today, pain not as bad, no n/v- wants something to drink-denies SOB Pt had fever to 101 yesterday, WBC up to 21,000, and hypotensive last night- has had fluid boluses, cultures and now on Zosyn BNP 5000+ MRCP- gallbladder wall thickened at 76mm, borderline GB distention, no stones, CBD normal-no stone, pancreatic atrophy, and moderate pancreatic ductal dilation diffusely Objective:  Vital signs in last 24 hours: Temp:  [97.8 F (36.6 C)-103.1 F (39.5 C)] 98 F (36.7 C) (11/16 0437) Pulse Rate:  [90-138] 93 (11/16 0437) Resp:  [16-28] 18 (11/16 0437) BP: (70-106)/(50-73) 106/68 mmHg (11/16 0437) SpO2:  [89 %-100 %] 100 % (11/16 0437) Weight:  [110 lb 0.2 oz (49.9 kg)] 110 lb 0.2 oz (49.9 kg) (11/15 1941) Last BM Date: 01/07/14 General:   Alert,  Well-developed,  AA female in NAD Heart:  Regular rate and rhythm; no murmurs Pulm;clear  Abdomen:  Soft, tender RUQ,and nondistended. Normal bowel sounds, without guarding, and without rebound.   Extremities:  Without edema. Neurologic:  Alert and  oriented x4;  grossly normal neurologically. Psych:  Alert and cooperative. Normal mood and affect.  Intake/Output from previous day: 11/15 0701 - 11/16 0700 In: 1313.8 [P.O.:120; I.V.:1143.8; IV Piggyback:50] Out: 300 [Urine:300] Intake/Output this shift:    Lab Results:  Recent Labs  01/07/14 2252 01/08/14 0700 01/09/14 0517  WBC 12.7* 14.6* 21.3*  HGB 10.4* 9.4* 8.6*  HCT 32.7* 29.6* 26.7*  PLT 218 165 113*   BMET  Recent Labs  01/07/14 2252 01/08/14 0700 01/09/14 0517  NA 137 137 143  K 4.3 3.7 4.2  CL 100 101 109  CO2 23 24 18*  GLUCOSE 144* 122* 87  BUN 40* 38* 42*  CREATININE 1.29* 1.34* 1.92*  CALCIUM 9.9 9.3 8.4   LFT  Recent Labs  01/08/14 0700 01/09/14 0517  PROT 7.0 6.0  ALBUMIN 3.4* 2.5*   AST 585* 215*  ALT 363* 229*  ALKPHOS 201* 161*  BILITOT 1.5* 1.8*  BILIDIR 0.7*  --   IBILI 0.8  --    PT/INR No results for input(s): LABPROT, INR in the last 72 hours. Hepatitis Panel No results for input(s): HEPBSAG, HCVAB, HEPAIGM, HEPBIGM in the last 72 hours.  Assessment / Plan: #1 63 yo female with acute pancreatitis in setting of apparent chronic pancreatitis- no evidence for CBD stone on MRCP- she does have GB wall thickening and distention-suspect acute ?acalculous cholecytitis- hypotension, rising WBC and fever yesterday c/w sepsis On Zosyn, cultures pending Start sips clears,change fluids to d5 1/2NS HIDA scan could confirm cholecystitis will order- then will need surg consult #2 Elevated BNP- pt with hx CHF #3 CKD #4 hx HTN Principal Problem:   Acute gallstone pancreatitis Active Problems:   Hypercholesteremia   Essential hypertension   CKD (chronic kidney disease) stage 3, GFR 39-03 ml/min   Diastolic dysfunction   AKI (acute kidney injury)     LOS: 2 days   Amy Esterwood  01/09/2014, 9:09 AM   Attending MD note:   I have taken a history, examined the patient, and reviewed the chart. I agree with the Advanced Practitioner's impression and recommendations.   [facility] Pager # [phone]

## 2014-01-09 NOTE — Progress Notes (Signed)
Utilization review complete. Ondra Deboard RN CCM Case Mgmt phone [phone] 

## 2014-01-09 NOTE — Progress Notes (Signed)
CT scan of the abdomen tonight shows possible ampullary mass or a pancreatic mass. Will check Ca19-9. Consider ERCP when stable VS's.

## 2014-01-09 NOTE — Progress Notes (Signed)
Received pt to 2C05 CHG bath and VS done MRSA screen done IV infiltrated will restart. Oriented to room

## 2014-01-09 NOTE — Progress Notes (Signed)
Pt transferred to 2C05 for signs of septic shock, pts BP is 78/54 heart rate 125 and pt is more drowsy and lethargic compared to the normal per family but A+O x4. Contrast complete, pt going down with nurse to CT of abd. NP paged about pts pressure and BP. NP Baltazar Najjar stated to monitor BP.  Jeriel Vivanco M. Dalbert Batman, RN, BSN 01/09/2014 7:58 PM

## 2014-01-09 NOTE — Progress Notes (Signed)
PROGRESS NOTE  Katelyn [name] GQQ:[id number] DOB: 1950/04/10 DOA: 01/07/2014 PCP: Milagros Evener, MD  HPI/Recap of past 24 hours: Patient is a 63 year old female with past medical history of diastolic dysfunction and hypertension who was admitted on the early morning  of 11/15 for midepigastric abdominal pain plus nausea and vomiting that started the night prior. Lab work on admission noted for mild acute renal failure, mild transaminitis with an AST of 281 and a lipase level of 2320. Patient admitted to the hospitalist service for presumed pancreatitis.  Initial abdominal ultrasound unrevealing.  By that afternoon, patient clinically improving and tolerating clear liquids with lipase improved.  Other labs including transaminases, white blood cell count creatinine mildly trending upward. Seen by GI and MRCP done which noted some mild gallbladder changes but no signs of definitive ductal dilatation.  By evening of hospital day 1, patient started becoming hypotensive and spiking fevers. Also noted to be mildly hypoxic requiring 2 L nasal cannula. Fluid bolus and IV fluids started. Blood cultures drawn and patient started on IV Zosyn for presumed got infection. Blood cultures came back on 11/16 for gram-negative rods.  Today, patient feeling okay with no abdominal pain. No further fevers once Zosyn started, however white blood cell count increased to 21 and creatinine up to 1.92. Still somewhat hypotensive. Patient has history of heart failure and BNP this morning at 5900  Assessment/Plan: Principal Problem:   Acute gallstone pancreatitis: clinically improved with lipase now normalized. GI following and hida scan ordered.  Septic shock: Suspect gut as underlying issue. Awaiting further blood culture sensitivities. Responding to IV Zosyn. Transfer to stepdown given low blood pressures.    Hypoxia: Suspect this may be more from poor inspiratory effort because of pancreatitis. Chest x-ray  unrevealing. Continue oxygen for now. Active Problems:   Hypercholesteremia: Holding statin   Essential hypertension: holding antihypertensives given hypotension  History of CVA with no residual deficits: Noted    Acute renal failure in the setting of CKD (chronic kidney disease) stage 3, GFR 30-59 ml/min:increased IV fluids. Creatinine worsened secondary to septic shock   acute on chronic Diastolic dysfunction: Seen on previous echo. Now volume overloaded secondary to aggressive volume resuscitation  Anemia: Unclear etiology with normal MCV. Dropped further since yesterday which may be from initial hemoconcentration. May be from chronic renal disease, repeating labs this afternoon  Code Status: full code  Family Communication:spoke with husband at the bedside  Disposition Plan: transfer to stepdown for closer monitoring   Consultants:  gastroenterology  Procedures:  none  Antibiotics:  none   Objective: BP 86/62 mmHg  Pulse 86  Temp(Src) 97.8 F (36.6 C) (Oral)  Resp 20  Ht 4\' 6"  (1.372 m)  Wt 49.9 kg (110 lb 0.2 oz)  BMI 26.51 kg/m2  SpO2 100%  Intake/Output Summary (Last 24 hours) at 01/09/14 1221 Last data filed at 01/09/14 0900  Gross per 24 hour  Intake 1193.75 ml  Output    500 ml  Net 693.75 ml   Filed Weights   01/08/14 0534 01/08/14 1941  Weight: 48.8 kg (107 lb 9.4 oz) 49.9 kg (110 lb 0.2 oz)    Exam:   General:  Alert and oriented 3, no acute distress, fatigued.  Cardiovascular: regular rate and rhythm, S1-S2  Respiratory: decreased breath sounds bibasilar  Abdomen: soft, minimal soreness in the midepigastric region, nondistended, hypoactive bowel sounds  Musculoskeletal:no clubbing or cyanosis or edema   Data Reviewed: Basic Metabolic Panel:  Recent Labs Lab 01/07/14 2252 01/08/14 0700  01/09/14 0517  NA 137 137 143  K 4.3 3.7 4.2  CL 100 101 109  CO2 23 24 18*  GLUCOSE 144* 122* 87  BUN 40* 38* 42*  CREATININE 1.29*  1.34* 1.92*  CALCIUM 9.9 9.3 8.4   Liver Function Tests:  Recent Labs Lab 01/07/14 2252 02-04-14 0700 01/09/14 0517  AST 281* 585* 215*  ALT 128* 363* 229*  ALKPHOS 148* 201* 161*  BILITOT 0.6 1.5* 1.8*  PROT 8.2 7.0 6.0  ALBUMIN 3.8 3.4* 2.5*    Recent Labs Lab 01/07/14 2252 04-Feb-2014 0700 01/09/14 0517  LIPASE 2320* 1099* 50   No results for input(s): AMMONIA in the last 168 hours. CBC:  Recent Labs Lab 01/07/14 2252 February 04, 2014 0700 01/09/14 0517  WBC 12.7* 14.6* 21.3*  NEUTROABS 10.4*  --   --   HGB 10.4* 9.4* 8.6*  HCT 32.7* 29.6* 26.7*  MCV 92.6 93.1 92.4  PLT 218 165 113*   Cardiac Enzymes:   No results for input(s): CKTOTAL, CKMB, CKMBINDEX, TROPONINI in the last 168 hours. BNP (last 3 results)  Recent Labs  01/09/14 0517  PROBNP 5933.0*   CBG: No results for input(s): GLUCAP in the last 168 hours.  Recent Results (from the past 240 hour(s))  Culture, blood (routine x 2)     Status: None (Preliminary result)   Collection Time: 02-04-14  6:32 PM  Result Value Ref Range Status   Specimen Description BLOOD LEFT HAND  Final   Special Requests BOTTLES DRAWN AEROBIC AND ANAEROBIC 10CC EA  Final   Culture  Setup Time   Final    01/09/2014 03:32 Performed at [facility]    Culture   Final    GRAM NEGATIVE RODS Note: Gram Stain Report Called to,Read Back By and Verified With: ASHLEY@12 :05PM ON 01/09/14 BY DANTS Performed at [facility]    Report Status PENDING  Incomplete  Culture, blood (routine x 2)     Status: None (Preliminary result)   Collection Time: February 04, 2014  6:38 PM  Result Value Ref Range Status   Specimen Description BLOOD LEFT ANTECUBITAL  Final   Special Requests BOTTLES DRAWN AEROBIC AND ANAEROBIC 10CC EA  Final   Culture  Setup Time   Final    01/09/2014 03:32 Performed at [facility]    Culture   Final    GRAM NEGATIVE RODS Note: Gram Stain Report Called to,Read Back By and Verified With:  ASHLEY@12 :05PM ON 01/09/14 BY DANTS Performed at [facility]    Report Status PENDING  Incomplete     Studies: Mr 3d Recon At Scanner  2014/02/04   CLINICAL DATA:  Pancreatitis due to common bile duct stone K85.9 (ICD-10-CM). PMH of HTN and hyperlipidemia who is admitted for gallstone pancreatitis. Her symptoms started rather suddenly. She is a soft spoken person and drowsy from phenergan at the time of the interview. Further work up in the hospital revealed elevated liver enzymes and pancreatitis. No overt evidence of gallstones, but the CBD is 6 mm. No evidence of cholecystitis  EXAM: MRI ABDOMEN WITHOUT AND WITH CONTRAST (INCLUDING MRCP)  TECHNIQUE: Multiplanar multisequence MR imaging of the abdomen was performed both before and after the administration of intravenous contrast. Heavily T2-weighted images of the biliary and pancreatic ducts were obtained, and three-dimensional MRCP images were rendered by post processing.  CONTRAST:  28mL MULTIHANCE GADOBENATE DIMEGLUMINE 529 MG/ML IV SOLN  COMPARISON:  Right upper quadrant ultrasound of earlier today.  FINDINGS: The majority of the  exam is moderate to markedly motion degraded.  Lower chest: Cardiomegaly without pericardial or pleural effusion.  Hepatobiliary: Probable cyst in the posterior right lobe of the liver at 7 mm. Intrahepatic ducts upper normal for age. There is mild gallbladder wall thickening at 6 mm on image 22 series 8. Borderline gallbladder distension at approximately 8 cm. Favor artifactual T2 hypointensity within the dependent gallbladder and adjacent proximal common duct. Example image 18 of series 5. Not confirmed on other pulse sequences, including repeat series 7. The common duct is normal for age measuring maximally 7 mm. Other than the above presumed artifact, no evidence of choledocholithiasis.  Pancreas: Pancreatic atrophy with moderate diffuse duct dilatation and irregularity. No well-defined pancreatic mass or  significant peripancreatic inflammation.  Spleen: Normal  Adrenals/Urinary Tract: Mild left adrenal thickening. Normal right adrenal gland. Mild renal cortical thinning bilaterally. No hydronephrosis.  Stomach/Bowel: Small hiatal hernia. Grossly normal large and small bowel loops.  Vascular/Lymphatic: Advanced aortic and branch vessel atherosclerosis. No abdominal adenopathy.  Other:  Trace perihepatic ascites.  Musculoskeletal: No focal osseous abnormality.  IMPRESSION: 1. Moderate to severely motion degraded exam. 2. Borderline gallbladder distention and gallbladder wall thickening, without gallstone or specific evidence of acute cholecystitis. 3. No evidence of biliary ductal dilatation or convincing evidence of choledocholithiasis. 4. Findings of chronic pancreatitis. 5. Trace perihepatic ascites.   Electronically Signed   By: Abigail Miyamoto M.D.   On: 01/08/2014 17:36   US Abdomen Limited  01/08/2014   CLINICAL DATA:  Nnausea, vomiting, pancreatitis  EXAM: US ABDOMEN LIMITED - RIGHT UPPER QUADRANT  COMPARISON:  None.  FINDINGS: Gallbladder:  No gallstones or wall thickening visualized. No sonographic Murphy sign noted.  Common bile duct:  Diameter: 6.8 mm  Liver:  No focal lesion identified. Within normal limits in parenchymal echogenicity.  IMPRESSION: Normal right upper quadrant ultrasound.   Electronically Signed   By: Kathreen Devoid   On: 01/08/2014 03:35   Mr Abd W/wo Cm/mrcp  01/08/2014   CLINICAL DATA:  Pancreatitis due to common bile duct stone K85.9 (ICD-10-CM). PMH of HTN and hyperlipidemia who is admitted for gallstone pancreatitis. Her symptoms started rather suddenly. She is a soft spoken person and drowsy from phenergan at the time of the interview. Further work up in the hospital revealed elevated liver enzymes and pancreatitis. No overt evidence of gallstones, but the CBD is 6 mm. No evidence of cholecystitis  EXAM: MRI ABDOMEN WITHOUT AND WITH CONTRAST (INCLUDING MRCP)  TECHNIQUE:  Multiplanar multisequence MR imaging of the abdomen was performed both before and after the administration of intravenous contrast. Heavily T2-weighted images of the biliary and pancreatic ducts were obtained, and three-dimensional MRCP images were rendered by post processing.  CONTRAST:  13mL MULTIHANCE GADOBENATE DIMEGLUMINE 529 MG/ML IV SOLN  COMPARISON:  Right upper quadrant ultrasound of earlier today.  FINDINGS: The majority of the exam is moderate to markedly motion degraded.  Lower chest: Cardiomegaly without pericardial or pleural effusion.  Hepatobiliary: Probable cyst in the posterior right lobe of the liver at 7 mm. Intrahepatic ducts upper normal for age. There is mild gallbladder wall thickening at 6 mm on image 22 series 8. Borderline gallbladder distension at approximately 8 cm. Favor artifactual T2 hypointensity within the dependent gallbladder and adjacent proximal common duct. Example image 18 of series 5. Not confirmed on other pulse sequences, including repeat series 7. The common duct is normal for age measuring maximally 7 mm. Other than the above presumed artifact, no evidence of choledocholithiasis.  Pancreas: Pancreatic atrophy with moderate diffuse duct dilatation and irregularity. No well-defined pancreatic mass or significant peripancreatic inflammation.  Spleen: Normal  Adrenals/Urinary Tract: Mild left adrenal thickening. Normal right adrenal gland. Mild renal cortical thinning bilaterally. No hydronephrosis.  Stomach/Bowel: Small hiatal hernia. Grossly normal large and small bowel loops.  Vascular/Lymphatic: Advanced aortic and branch vessel atherosclerosis. No abdominal adenopathy.  Other:  Trace perihepatic ascites.  Musculoskeletal: No focal osseous abnormality.  IMPRESSION: 1. Moderate to severely motion degraded exam. 2. Borderline gallbladder distention and gallbladder wall thickening, without gallstone or specific evidence of acute cholecystitis. 3. No evidence of biliary  ductal dilatation or convincing evidence of choledocholithiasis. 4. Findings of chronic pancreatitis. 5. Trace perihepatic ascites.   Electronically Signed   By: Abigail Miyamoto M.D.   On: 01/08/2014 17:36    Scheduled Meds: . [MAR Hold] pantoprazole (PROTONIX) IV  40 mg Intravenous Q12H  . [MAR Hold] piperacillin-tazobactam (ZOSYN)  IV  3.375 g Intravenous 3 times per day    Continuous Infusions: . dextrose 5 % and 0.45 % NaCl with KCl 10 mEq/L 100 mL/hr at 01/09/14 1044     Time spent: 45 minutes  [facility] Pager [phone]. If 7PM-7AM, please contact night-coverage at [email or web address], password [facility] 01/09/2014, 12:21 PM  LOS: 2 days

## 2014-01-10 LAB — CBC
HEMATOCRIT: 24.1 % — AB (ref 36.0–46.0)
Hemoglobin: 8 g/dL — ABNORMAL LOW (ref 12.0–15.0)
MCH: 29.9 pg (ref 26.0–34.0)
MCHC: 33.2 g/dL (ref 30.0–36.0)
MCV: 89.9 fL (ref 78.0–100.0)
Platelets: 76 10*3/uL — ABNORMAL LOW (ref 150–400)
RBC: 2.68 MIL/uL — AB (ref 3.87–5.11)
RDW: 13.1 % (ref 11.5–15.5)
WBC: 22.4 10*3/uL — ABNORMAL HIGH (ref 4.0–10.5)

## 2014-01-10 LAB — COMPREHENSIVE METABOLIC PANEL
ALT: 151 U/L — AB (ref 0–35)
ANION GAP: 15 (ref 5–15)
AST: 120 U/L — ABNORMAL HIGH (ref 0–37)
Albumin: 2.3 g/dL — ABNORMAL LOW (ref 3.5–5.2)
Alkaline Phosphatase: 147 U/L — ABNORMAL HIGH (ref 39–117)
BUN: 39 mg/dL — ABNORMAL HIGH (ref 6–23)
CO2: 20 meq/L (ref 19–32)
Calcium: 8.2 mg/dL — ABNORMAL LOW (ref 8.4–10.5)
Chloride: 103 mEq/L (ref 96–112)
Creatinine, Ser: 1.93 mg/dL — ABNORMAL HIGH (ref 0.50–1.10)
GFR, EST AFRICAN AMERICAN: 30 mL/min — AB (ref >90)
GFR, EST NON AFRICAN AMERICAN: 26 mL/min — AB (ref >90)
Glucose, Bld: 105 mg/dL — ABNORMAL HIGH (ref 70–99)
Potassium: 3.8 mEq/L (ref 3.7–5.3)
SODIUM: 138 meq/L (ref 137–147)
Total Bilirubin: 2.1 mg/dL — ABNORMAL HIGH (ref 0.3–1.2)
Total Protein: 5.8 g/dL — ABNORMAL LOW (ref 6.0–8.3)

## 2014-01-10 LAB — CLOSTRIDIUM DIFFICILE BY PCR: Toxigenic C. Difficile by PCR: NEGATIVE

## 2014-01-10 LAB — LIPASE, BLOOD: Lipase: 92 U/L — ABNORMAL HIGH (ref 11–59)

## 2014-01-10 LAB — CANCER ANTIGEN 19-9: CA 19-9: 3877.8 U/mL — ABNORMAL HIGH (ref <35.0)

## 2014-01-10 LAB — OCCULT BLOOD X 1 CARD TO LAB, STOOL: FECAL OCCULT BLD: NEGATIVE

## 2014-01-10 MED ORDER — PIPERACILLIN-TAZOBACTAM IN DEX 2-0.25 GM/50ML IV SOLN
2.2500 g | Freq: Four times a day (QID) | INTRAVENOUS | Status: DC
  Administered 2014-01-10 – 2014-01-11 (×4): 2.25 g via INTRAVENOUS
  Filled 2014-01-10 (×5): qty 50

## 2014-01-10 NOTE — Plan of Care (Signed)
Problem: Phase II Progression Outcomes Goal: Progress activity as tolerated unless otherwise ordered Outcome: Progressing Goal: Discharge plan established Outcome: Progressing Goal: Vital signs remain stable Outcome: Progressing

## 2014-01-10 NOTE — Progress Notes (Signed)
Subjective  No complaints this morning,pt was transferred to  Monitored bed due to low  B.P suspected sepsis   Objective   BP around 90 systolic, pt sitting up in bed, feeling fine, no complaints.  The acute abdominal pain  Subsided, it was c/w biliary colic. But the CT scan last night suggests possible ampullary mass vs pancreatic head mass. Repeat LFT's pending. Vital signs in last 24 hours: Temp:  [97.8 F (36.6 C)-101.3 F (38.5 C)] 98.3 F (36.8 C) (11/17 0755) Pulse Rate:  [86-127] 106 (11/17 0755) Resp:  [18-28] 28 (11/17 0755) BP: (73-139)/(51-90) 73/54 mmHg (11/17 0755) SpO2:  [95 %-100 %] 98 % (11/17 0755) Weight:  [110 lb 0.2 oz (49.9 kg)] 110 lb 0.2 oz (49.9 kg) (11/16 1850) Last BM Date: 01/07/14 General:    AA female in NAD Heart: Rapid Regular rate at 120/min and rhythm; no murmurs Lungs: Respirations even and unlabored, lungs CTA bilaterally Abdomen:  Soft, nontender and nondistended. Normal bowel sounds. Extremities:  Without edema. Neurologic:  Alert and oriented,  grossly normal neurologically. Psych:  Cooperative. Normal mood and affect., wants to go home,  Intake/Output from previous day: 11/16 0701 - 11/17 0700 In: 2526.7 [P.O.:650; I.V.:1826.7; IV Piggyback:50] Out: 550 [Urine:550] Intake/Output this shift:    Lab Results:  Recent Labs  01/08/14 0700 01/09/14 0517 01/09/14 1528  WBC 14.6* 21.3* 8.7  HGB 9.4* 8.6* 9.7*  HCT 29.6* 26.7* 30.2*  PLT 165 113* 111*   BMET  Recent Labs  01/07/14 2252 01/08/14 0700 01/09/14 0517  NA 137 137 143  K 4.3 3.7 4.2  CL 100 101 109  CO2 23 24 18*  GLUCOSE 144* 122* 87  BUN 40* 38* 42*  CREATININE 1.29* 1.34* 1.92*  CALCIUM 9.9 9.3 8.4   LFT  Recent Labs  01/08/14 0700 01/09/14 0517  PROT 7.0 6.0  ALBUMIN 3.4* 2.5*  AST 585* 215*  ALT 363* 229*  ALKPHOS 201* 161*  BILITOT 1.5* 1.8*  BILIDIR 0.7*  --   IBILI 0.8  --    PT/INR No results for input(s): LABPROT, INR in the last 72  hours.  Studies/Results: Ct Abdomen Pelvis Wo Contrast  01/09/2014   CLINICAL DATA:  Upper abd pain. N/v, ?acute on chronic pancreatitis, recent MRCP GB wall thickening, no iv contrast due to renal insufficiency  EXAM: CT ABDOMEN AND PELVIS WITHOUT CONTRAST  TECHNIQUE: Multidetector CT imaging of the abdomen and pelvis was performed following the standard protocol without IV contrast.  COMPARISON:  MRCP, 01/08/2014  FINDINGS: There is a calcification that lies along the medial wall of the second portion the duodenum adjacent to the pancreatic head. Is surrounded by low-attenuation soft tissue that measures 20 mm x 17 mm x 18 mm in size. This may be a mass at the ampulla. The stone could reside in the distal duct or be extra ductal. Potentially, this apparent mass could be protruding medially from the pancreatic head.  No other evidence of a pancreatic mass. No CT evidence of pancreatitis. There is irregular dilation of the pancreatic duct better depicted on the recent MRCP. This is likely due to chronic pancreatitis.  Gallbladder is moderately distended. There is mild wall thickening. No radiopaque gallstone is seen. The fat adjacent to the gallbladder shows no significant inflammatory stranding.  Common bile duct was better defined on the MRCP. Common bile duct is prominent, but within the normal range for this patient's age.  Subtle low-density lesion is seen in the posterior segment  right lobe of the liver. This was a T2 hyperintense lesion on CT, likely a cyst. No other liver abnormalities. Spleen is unremarkable. There is adrenal gland thickening, greater on the left, likely hyperplasia. No discrete adrenal mass. Kidneys are unremarkable. Bladder is unremarkable.  Uterus is surgically absent.  No pelvic masses.  No adenopathy. There is a trace amount of ascites which lies adjacent to the liver and in the posterior pelvic recess.  There are numerous sigmoid colon diverticula. No diverticulitis. No bowel  wall thickening or bowel inflammatory change. No evidence of obstruction.  Dense aortoiliac vascular calcifications are noted.  No aneurysm.  There are mild degenerative changes of the visualized spine. Bones are demineralized. No osteoblastic or osteolytic lesions.  Small effusions with associated lung base atelectasis. Heart is normal in size. Small hiatal hernia.  IMPRESSION: 1. Possible ampullary versus pancreatic head mass containing a single calcification. This calcification could potentially reside in the distal common bile duct. The apparent mass measures 2 cm in size. Recommend followup endoscopy/ERCP. 2. Gallbladder wall thickening, but no pericholecystic inflammatory change. Gallbladder wall thickening is most likely reactive. Acute cholecystitis is felt less likely but not excluded. 3. No CT evidence of pancreatitis. 4. Trace ascites of unclear etiology. 5. Small pleural effusions with associated compressive atelectasis. 6. There are chronic findings including atherosclerotic change along the abdominal aorta and its branch vessels as well as extensive sigmoid colon diverticulosis. Status post hysterectomy.   Electronically Signed   By: Lajean Manes M.D.   On: 01/09/2014 21:19   Mr 3d Recon At Scanner  01/08/2014   CLINICAL DATA:  Pancreatitis due to common bile duct stone K85.9 (ICD-10-CM). PMH of HTN and hyperlipidemia who is admitted for gallstone pancreatitis. Her symptoms started rather suddenly. She is a soft spoken person and drowsy from phenergan at the time of the interview. Further work up in the hospital revealed elevated liver enzymes and pancreatitis. No overt evidence of gallstones, but the CBD is 6 mm. No evidence of cholecystitis  EXAM: MRI ABDOMEN WITHOUT AND WITH CONTRAST (INCLUDING MRCP)  TECHNIQUE: Multiplanar multisequence MR imaging of the abdomen was performed both before and after the administration of intravenous contrast. Heavily T2-weighted images of the biliary and  pancreatic ducts were obtained, and three-dimensional MRCP images were rendered by post processing.  CONTRAST:  48mL MULTIHANCE GADOBENATE DIMEGLUMINE 529 MG/ML IV SOLN  COMPARISON:  Right upper quadrant ultrasound of earlier today.  FINDINGS: The majority of the exam is moderate to markedly motion degraded.  Lower chest: Cardiomegaly without pericardial or pleural effusion.  Hepatobiliary: Probable cyst in the posterior right lobe of the liver at 7 mm. Intrahepatic ducts upper normal for age. There is mild gallbladder wall thickening at 6 mm on image 22 series 8. Borderline gallbladder distension at approximately 8 cm. Favor artifactual T2 hypointensity within the dependent gallbladder and adjacent proximal common duct. Example image 18 of series 5. Not confirmed on other pulse sequences, including repeat series 7. The common duct is normal for age measuring maximally 7 mm. Other than the above presumed artifact, no evidence of choledocholithiasis.  Pancreas: Pancreatic atrophy with moderate diffuse duct dilatation and irregularity. No well-defined pancreatic mass or significant peripancreatic inflammation.  Spleen: Normal  Adrenals/Urinary Tract: Mild left adrenal thickening. Normal right adrenal gland. Mild renal cortical thinning bilaterally. No hydronephrosis.  Stomach/Bowel: Small hiatal hernia. Grossly normal large and small bowel loops.  Vascular/Lymphatic: Advanced aortic and branch vessel atherosclerosis. No abdominal adenopathy.  Other:  Trace perihepatic ascites.  Musculoskeletal: No focal osseous abnormality.  IMPRESSION: 1. Moderate to severely motion degraded exam. 2. Borderline gallbladder distention and gallbladder wall thickening, without gallstone or specific evidence of acute cholecystitis. 3. No evidence of biliary ductal dilatation or convincing evidence of choledocholithiasis. 4. Findings of chronic pancreatitis. 5. Trace perihepatic ascites.   Electronically Signed   By: Abigail Miyamoto M.D.    On: 01/08/2014 17:36   Dg Chest Port 1 View  01/09/2014   CLINICAL DATA:  Hypoxia  EXAM: PORTABLE CHEST - 1 VIEW  COMPARISON:  03/03/2011  FINDINGS: The heart size and mediastinal contours are within normal limits. Both lungs are clear. The visualized skeletal structures are unremarkable.  IMPRESSION: No active disease.   Electronically Signed   By: Inez Catalina M.D.   On: 01/09/2014 00:25   Mr Jeananne Rama W/wo Cm/mrcp  01/08/2014   CLINICAL DATA:  Pancreatitis due to common bile duct stone K85.9 (ICD-10-CM). PMH of HTN and hyperlipidemia who is admitted for gallstone pancreatitis. Her symptoms started rather suddenly. She is a soft spoken person and drowsy from phenergan at the time of the interview. Further work up in the hospital revealed elevated liver enzymes and pancreatitis. No overt evidence of gallstones, but the CBD is 6 mm. No evidence of cholecystitis  EXAM: MRI ABDOMEN WITHOUT AND WITH CONTRAST (INCLUDING MRCP)  TECHNIQUE: Multiplanar multisequence MR imaging of the abdomen was performed both before and after the administration of intravenous contrast. Heavily T2-weighted images of the biliary and pancreatic ducts were obtained, and three-dimensional MRCP images were rendered by post processing.  CONTRAST:  54mL MULTIHANCE GADOBENATE DIMEGLUMINE 529 MG/ML IV SOLN  COMPARISON:  Right upper quadrant ultrasound of earlier today.  FINDINGS: The majority of the exam is moderate to markedly motion degraded.  Lower chest: Cardiomegaly without pericardial or pleural effusion.  Hepatobiliary: Probable cyst in the posterior right lobe of the liver at 7 mm. Intrahepatic ducts upper normal for age. There is mild gallbladder wall thickening at 6 mm on image 22 series 8. Borderline gallbladder distension at approximately 8 cm. Favor artifactual T2 hypointensity within the dependent gallbladder and adjacent proximal common duct. Example image 18 of series 5. Not confirmed on other pulse sequences, including repeat  series 7. The common duct is normal for age measuring maximally 7 mm. Other than the above presumed artifact, no evidence of choledocholithiasis.  Pancreas: Pancreatic atrophy with moderate diffuse duct dilatation and irregularity. No well-defined pancreatic mass or significant peripancreatic inflammation.  Spleen: Normal  Adrenals/Urinary Tract: Mild left adrenal thickening. Normal right adrenal gland. Mild renal cortical thinning bilaterally. No hydronephrosis.  Stomach/Bowel: Small hiatal hernia. Grossly normal large and small bowel loops.  Vascular/Lymphatic: Advanced aortic and branch vessel atherosclerosis. No abdominal adenopathy.  Other:  Trace perihepatic ascites.  Musculoskeletal: No focal osseous abnormality.  IMPRESSION: 1. Moderate to severely motion degraded exam. 2. Borderline gallbladder distention and gallbladder wall thickening, without gallstone or specific evidence of acute cholecystitis. 3. No evidence of biliary ductal dilatation or convincing evidence of choledocholithiasis. 4. Findings of chronic pancreatitis. 5. Trace perihepatic ascites.   Electronically Signed   By: Abigail Miyamoto M.D.   On: 01/08/2014 17:36       Assessment / Plan:   Abnormal imaging of the biliary/pancreatic anatomy, c/w chronic pancreatitis as well as possibility of periampullary mass. LFT's are trending down, and patient is asymptomatic. Ca19-9 pending. I will arrange for an ERCP  Probably for tomorrow., I have discussed it with the patient. Follow up LFT's, INR tomorrow.  Continue Zosyn.  Principal Problem:   Septic shock Active Problems:   Hypercholesteremia   Essential hypertension   CKD (chronic kidney disease) stage 3, GFR 30-59 ml/min   Acute gallstone pancreatitis   AKI (acute kidney injury)   Acute on chronic diastolic heart failure   Anemia   Hypoxia   Cholecystitis     LOS: 3 days   Delfin Edis  01/10/2014, 8:17 AM

## 2014-01-10 NOTE — Progress Notes (Signed)
ANTIBIOTIC CONSULT NOTE - FOLLOW UP  Pharmacy Consult for Zosyn Indication: GNR bacteremia/sepsis from abdominal source  No Known Allergies  Patient Measurements: Height: 4\' 6"  (137.2 cm) Weight: 110 lb 0.2 oz (49.9 kg) IBW/kg (Calculated) : 31.7  Vital Signs: Temp: 98.3 F (36.8 C) (11/17 0755) Temp Source: Oral (11/17 0755) BP: 91/65 mmHg (11/17 0900) Pulse Rate: 97 (11/17 0900) Intake/Output from previous day: 11/16 0701 - 11/17 0700 In: 2526.7 [P.O.:650; I.V.:1826.7; IV Piggyback:50] Out: 550 [Urine:550] Intake/Output from this shift:    Labs:  Recent Labs  01/08/14 0700 01/09/14 0517 01/09/14 1528 01/10/14 0838  WBC 14.6* 21.3* 8.7 22.4*  HGB 9.4* 8.6* 9.7* 8.0*  PLT 165 113* 111* 76*  CREATININE 1.34* 1.92*  --  1.93*   Estimated Creatinine Clearance: 18.1 mL/min (by C-G formula based on Cr of 1.93). No results for input(s): VANCOTROUGH, VANCOPEAK, VANCORANDOM, GENTTROUGH, GENTPEAK, GENTRANDOM, TOBRATROUGH, TOBRAPEAK, TOBRARND, AMIKACINPEAK, AMIKACINTROU, AMIKACIN in the last 72 hours.   Microbiology: Recent Results (from the past 720 hour(s))  Culture, blood (routine x 2)     Status: None (Preliminary result)   Collection Time: 01/08/14  6:32 PM  Result Value Ref Range Status   Specimen Description BLOOD LEFT HAND  Final   Special Requests BOTTLES DRAWN AEROBIC AND ANAEROBIC 10CC EA  Final   Culture  Setup Time   Final    01/09/2014 03:32 Performed at [facility]    Culture   Final    GRAM NEGATIVE RODS Note: Gram Stain Report Called to,Read Back By and Verified With: ASHLEY@12 :05PM ON 01/09/14 BY DANTS Performed at [facility]    Report Status PENDING  Incomplete  Culture, blood (routine x 2)     Status: None (Preliminary result)   Collection Time: 01/08/14  6:38 PM  Result Value Ref Range Status   Specimen Description BLOOD LEFT ANTECUBITAL  Final   Special Requests BOTTLES DRAWN AEROBIC AND ANAEROBIC 10CC EA  Final   Culture   Setup Time   Final    01/09/2014 03:32 Performed at [facility]    Culture   Final    GRAM NEGATIVE RODS Note: Gram Stain Report Called to,Read Back By and Verified With: ASHLEY@12 :05PM ON 01/09/14 BY DANTS Performed at [facility]    Report Status PENDING  Incomplete  MRSA PCR Screening     Status: None   Collection Time: 01/09/14  6:36 PM  Result Value Ref Range Status   MRSA by PCR NEGATIVE NEGATIVE Final    Comment:        The GeneXpert MRSA Assay (FDA approved for NASAL specimens only), is one component of a comprehensive MRSA colonization surveillance program. It is not intended to diagnose MRSA infection nor to guide or monitor treatment for MRSA infections.     Anti-infectives    Start     Dose/Rate Route Frequency Ordered Stop   01/10/14 1200  piperacillin-tazobactam (ZOSYN) IVPB 2.25 g     2.25 g100 mL/hr over 30 Minutes Intravenous 4 times per day 01/10/14 1020     01/08/14 2200  piperacillin-tazobactam (ZOSYN) IVPB 3.375 g  Status:  Discontinued     3.375 g12.5 mL/hr over 240 Minutes Intravenous 3 times per day 01/08/14 1815 01/10/14 1020      Assessment: 63 y/o female admitted 01/07/2014 with abdominal pain and elevated liver enzymes.She continues on day 3 Zosyn for GNR bacteremia/sepsis from abdominal source. SCr remains elevated at 1.93 today and CrCl ~ 18 ml/min. Tmax is  101.3 but has been afebrile since yesterday afternoon. WBC remain elevated.  Zosyn 11/15>>  11/16 BCx2 - GNR  Goal of Therapy:  Eradication of infection  Plan:  - Decrease Zosyn to 2.25 g IV q6h for CrCl <20 ml/min - Monitor renal function, culture data, and clinical progress  [facility], Pharm.D., BCPS Clinical Pharmacist Pager: [phone] 01/10/2014 10:30 AM

## 2014-01-10 NOTE — Progress Notes (Signed)
ERCP set up for 1400 11/18 with Dr Carlean Purl.  On Zosyn already, will not need additional preop abx. Indocin ordered post op.  As pt is hungry, not nauseated or in significant abdominal pain, will allow regular diet in place of clears.  Explained reason for ercp (evaluate pancreatic growth seen on CT), procedural details (using smart phone based biliary anatomy diagram), risks (bleeding, pancreatitis, infection, intestinal or bile duct injury), need for sedation, to pt and her fiance.  They are willing to proceed, all questions answered.   Sandi Carne PA C Attending MD note:   I have taken a history, examined the patient, and reviewed the chart. I agree with the Advanced Practitioner's impression and recommendations.   [facility] Pager # [phone]

## 2014-01-10 NOTE — Progress Notes (Signed)
Paged Dr. Ricki Rodriguez several times for pt's daughter as she stated and insisted she needed to speak with a doctor. Nurse received a call from Dr. [place] Pink and he talked to pt's daughter over the phone. Pt's daughter stated md suppose to call back and speak to her and her sister. Passed on to night nurse. Gave pt's daughter Neurosurgeon on ESCP and tried to explain about procedure and answer questions that were nurse appropriate for patient and pt's daughter. GI doctor wants to perform procedure tomorrow. Pt and pt's daughter not willing to sign a consent until they talk more with doctor.

## 2014-01-10 NOTE — Progress Notes (Signed)
PROGRESS NOTE  Katelyn [name]:[id number] DOB: [birth year] DOA: 01/07/2014 PCP: Milagros Evener, MD  HPI/Recap of past 24 hours: Patient is a 63 year old female with past medical history of diastolic dysfunction and hypertension who was admitted on the early morning  of 11/15 for midepigastric abdominal pain plus nausea and vomiting that started the night prior. Lab work on admission noted for mild acute renal failure, mild transaminitis with an AST of 281 and a lipase level of 2320. Patient admitted to the hospitalist service for presumed pancreatitis.  Initial abdominal ultrasound unrevealing.  By that afternoon, patient clinically improving and tolerating clear liquids with lipase improved.  Other labs including transaminases, white blood cell count and creatinine mildly trending upward. Seen by GI and MRCP done which noted some mild gallbladder changes but no signs of definitive ductal dilatation.  By evening of hospital day 1, patient started becoming hypotensive and spiking fevers. Also noted to be mildly hypoxic requiring 2 L nasal cannula. Fluid bolus and IV fluids started. Blood cultures drawn and patient started on IV Zosyn for presumed got infection. Blood cultures came back on 11/16 for gram-negative rods.  Chest x-ray unrevealing. Source felt to be abdominal.  Zosyn started in no further fever since. However, following day, white blood cell count up to 21 and creatinine up to 1.92 and patient still somewhat hypotensive. Patient has history of heart failure and BNP at 5900, so transferred to step down unit.  CT done without contrast noting questionable mass in pancreatic head  This morning, patient herself feels okay. No abdominal pain or shortness of breath. Tolerating clear liquids. Blood pressure remains soft, although she is asymptomatic from this. White blood cell count dropped to 8.7 yesterday afternoon and then increased back up to 22.4. Question accuracy of yesterday's  results. Creatinine unchanged.  Assessment/Plan: Principal Problem:   Acute gallstone pancreatitis: clinically improved with lipase now normalized. Hida scan also unrevealing. CT scan noting obstructing mass or GI plans for ERCP tomorrow  Septic shock: Suspect gut as underlying issue. Awaiting further blood culture sensitivities-gram-negative rods so far. Responding to IV Zosyn.  Continuing to stepdown.White blood cell count continues to remain elevated although no further fevers. Her  Hypoxia: Suspect this may be more from poor inspiratory effort because of pancreatitis. Chest x-ray unrevealing. Patient breathing comfortably. Active Problems:   Hypercholesteremia: Holding statin   Essential hypertension: holding antihypertensives given hypotension  History of CVA with no residual deficits: Noted    Acute renal failure in the setting of CKD (chronic kidney disease) stage 3, GFR 30-59 ml/min:increased IV fluids. Creatinine worsened secondary to septic shock, currently at 1.9   acute on chronic Diastolic dysfunction: Seen on previous echo. Now volume overloaded secondary to aggressive volume resuscitation.  Holding off on diuresing until blood pressure stabilized  Anemia: Unclear etiology with normal MCV. Dropped further since yesterday which may be from initial hemoconcentration. Has had steady decline since admission. Will heme test stools to ensure there is no underlying bleeding  Code Status: full code  Family Communication:spoke with husband by phone  Disposition Plan: continue in stepdown. Need to see resolution of shock, improvement of creatinine and formal diagnosis of cause of pancreatitis  Consultants:  gastroenterology  Procedures:  none  Antibiotics:  none   Objective: BP 88/62 mmHg  Pulse 100  Temp(Src) 98.2 F (36.8 C) (Oral)  Resp 20  Ht 4\' 6"  (1.372 m)  Wt 49.9 kg (110 lb 0.2 oz)  BMI 26.51 kg/m2  SpO2 100%  Intake/Output Summary (  Last 24 hours) at  01/10/14 1324 Last data filed at 01/10/14 0556  Gross per 24 hour  Intake 2526.67 ml  Output    150 ml  Net 2376.67 ml   Filed Weights   01/08/14 0534 01/08/14 1941 01/09/14 1850  Weight: 48.8 kg (107 lb 9.4 oz) 49.9 kg (110 lb 0.2 oz) 49.9 kg (110 lb 0.2 oz)    Exam:   General:  Alert and oriented 3, no acute distress, fatigued.  Cardiovascular: regular rate and rhythm, S1-S2  Respiratory: decreased breath sounds bibasilar  Abdomen: soft, nontender, nondistended, hypoactive bowel sounds  Musculoskeletal:no clubbing or cyanosis or edema   Data Reviewed: Basic Metabolic Panel:  Recent Labs Lab 01/07/14 2252 01/08/14 0700 01/09/14 0517 01/10/14 0838  NA 137 137 143 138  K 4.3 3.7 4.2 3.8  CL 100 101 109 103  CO2 23 24 18* 20  GLUCOSE 144* 122* 87 105*  BUN 40* 38* 42* 39*  CREATININE 1.29* 1.34* 1.92* 1.93*  CALCIUM 9.9 9.3 8.4 8.2*   Liver Function Tests:  Recent Labs Lab 01/07/14 2252 01/08/14 0700 01/09/14 0517 01/10/14 0838  AST 281* 585* 215* 120*  ALT 128* 363* 229* 151*  ALKPHOS 148* 201* 161* 147*  BILITOT 0.6 1.5* 1.8* 2.1*  PROT 8.2 7.0 6.0 5.8*  ALBUMIN 3.8 3.4* 2.5* 2.3*    Recent Labs Lab 01/07/14 2252 01/08/14 0700 01/09/14 0517 01/10/14 0324  LIPASE 2320* 1099* 50 92*   No results for input(s): AMMONIA in the last 168 hours. CBC:  Recent Labs Lab 01/07/14 2252 01/08/14 0700 01/09/14 0517 01/09/14 1528 01/10/14 0838  WBC 12.7* 14.6* 21.3* 8.7 22.4*  NEUTROABS 10.4*  --   --   --   --   HGB 10.4* 9.4* 8.6* 9.7* 8.0*  HCT 32.7* 29.6* 26.7* 30.2* 24.1*  MCV 92.6 93.1 92.4 92.1 89.9  PLT 218 165 113* 111* 76*   Cardiac Enzymes:   No results for input(s): CKTOTAL, CKMB, CKMBINDEX, TROPONINI in the last 168 hours. BNP (last 3 results)  Recent Labs  01/09/14 0517  PROBNP 5933.0*   CBG: No results for input(s): GLUCAP in the last 168 hours.  Recent Results (from the past 240 hour(s))  Culture, blood (routine x 2)      Status: None (Preliminary result)   Collection Time: 01/08/14  6:32 PM  Result Value Ref Range Status   Specimen Description BLOOD LEFT HAND  Final   Special Requests BOTTLES DRAWN AEROBIC AND ANAEROBIC 10CC EA  Final   Culture  Setup Time   Final    01/09/2014 03:32 Performed at [facility]    Culture   Final    GRAM NEGATIVE RODS Note: Gram Stain Report Called to,Read Back By and Verified With: ASHLEY@12 :05PM ON 01/09/14 BY DANTS Performed at [facility]    Report Status PENDING  Incomplete  Culture, blood (routine x 2)     Status: None (Preliminary result)   Collection Time: 01/08/14  6:38 PM  Result Value Ref Range Status   Specimen Description BLOOD LEFT ANTECUBITAL  Final   Special Requests BOTTLES DRAWN AEROBIC AND ANAEROBIC 10CC EA  Final   Culture  Setup Time   Final    01/09/2014 03:32 Performed at [facility]    Culture   Final    GRAM NEGATIVE RODS Note: Gram Stain Report Called to,Read Back By and Verified With: ASHLEY@12 :05PM ON 01/09/14 BY DANTS Performed at [facility]    Report Status PENDING  Incomplete  MRSA PCR Screening     Status: None   Collection Time: 01/09/14  6:36 PM  Result Value Ref Range Status   MRSA by PCR NEGATIVE NEGATIVE Final    Comment:        The GeneXpert MRSA Assay (FDA approved for NASAL specimens only), is one component of a comprehensive MRSA colonization surveillance program. It is not intended to diagnose MRSA infection nor to guide or monitor treatment for MRSA infections.      Studies: Ct Abdomen Pelvis Wo Contrast  01/09/2014   CLINICAL DATA:  Upper abd pain. N/v, ?acute on chronic pancreatitis, recent MRCP GB wall thickening, no iv contrast due to renal insufficiency  EXAM: CT ABDOMEN AND PELVIS WITHOUT CONTRAST  TECHNIQUE: Multidetector CT imaging of the abdomen and pelvis was performed following the standard protocol without IV contrast.  COMPARISON:  MRCP, 01/08/2014  FINDINGS:  There is a calcification that lies along the medial wall of the second portion the duodenum adjacent to the pancreatic head. Is surrounded by low-attenuation soft tissue that measures 20 mm x 17 mm x 18 mm in size. This may be a mass at the ampulla. The stone could reside in the distal duct or be extra ductal. Potentially, this apparent mass could be protruding medially from the pancreatic head.  No other evidence of a pancreatic mass. No CT evidence of pancreatitis. There is irregular dilation of the pancreatic duct better depicted on the recent MRCP. This is likely due to chronic pancreatitis.  Gallbladder is moderately distended. There is mild wall thickening. No radiopaque gallstone is seen. The fat adjacent to the gallbladder shows no significant inflammatory stranding.  Common bile duct was better defined on the MRCP. Common bile duct is prominent, but within the normal range for this patient's age.  Subtle low-density lesion is seen in the posterior segment right lobe of the liver. This was a T2 hyperintense lesion on CT, likely a cyst. No other liver abnormalities. Spleen is unremarkable. There is adrenal gland thickening, greater on the left, likely hyperplasia. No discrete adrenal mass. Kidneys are unremarkable. Bladder is unremarkable.  Uterus is surgically absent.  No pelvic masses.  No adenopathy. There is a trace amount of ascites which lies adjacent to the liver and in the posterior pelvic recess.  There are numerous sigmoid colon diverticula. No diverticulitis. No bowel wall thickening or bowel inflammatory change. No evidence of obstruction.  Dense aortoiliac vascular calcifications are noted.  No aneurysm.  There are mild degenerative changes of the visualized spine. Bones are demineralized. No osteoblastic or osteolytic lesions.  Small effusions with associated lung base atelectasis. Heart is normal in size. Small hiatal hernia.  IMPRESSION: 1. Possible ampullary versus pancreatic head mass  containing a single calcification. This calcification could potentially reside in the distal common bile duct. The apparent mass measures 2 cm in size. Recommend followup endoscopy/ERCP. 2. Gallbladder wall thickening, but no pericholecystic inflammatory change. Gallbladder wall thickening is most likely reactive. Acute cholecystitis is felt less likely but not excluded. 3. No CT evidence of pancreatitis. 4. Trace ascites of unclear etiology. 5. Small pleural effusions with associated compressive atelectasis. 6. There are chronic findings including atherosclerotic change along the abdominal aorta and its branch vessels as well as extensive sigmoid colon diverticulosis. Status post hysterectomy.   Electronically Signed   By: Lajean Manes M.D.   On: 01/09/2014 21:19   Dg Chest Port 1 View  01/09/2014   CLINICAL DATA:  Hypoxia  EXAM: PORTABLE  CHEST - 1 VIEW  COMPARISON:  03/03/2011  FINDINGS: The heart size and mediastinal contours are within normal limits. Both lungs are clear. The visualized skeletal structures are unremarkable.  IMPRESSION: No active disease.   Electronically Signed   By: Inez Catalina M.D.   On: 01/09/2014 00:25    Scheduled Meds: . antiseptic oral rinse  7 mL Mouth Rinse BID  . piperacillin-tazobactam (ZOSYN)  IV  2.25 g Intravenous 4 times per day    Continuous Infusions: . dextrose 5 % and 0.45 % NaCl with KCl 10 mEq/L 100 mL/hr at 01/09/14 2016     Time spent: 35 minutes  [facility] Pager [phone]. If 7PM-7AM, please contact night-coverage at [email or web address], password [facility] 01/10/2014, 1:24 PM  LOS: 3 days

## 2014-01-11 ENCOUNTER — Inpatient Hospital Stay (HOSPITAL_COMMUNITY): Payer: Medicare Other | Admitting: Certified Registered Nurse Anesthetist

## 2014-01-11 ENCOUNTER — Encounter (HOSPITAL_COMMUNITY)
Admission: EM | Disposition: A | Discharge status: Home Health | Payer: Medicare Other | Source: Home / Self Care | Attending: Internal Medicine

## 2014-01-11 ENCOUNTER — Encounter (HOSPITAL_COMMUNITY): Payer: Self-pay | Admitting: Certified Registered Nurse Anesthetist

## 2014-01-11 ENCOUNTER — Inpatient Hospital Stay (HOSPITAL_COMMUNITY): Payer: Medicare Other

## 2014-01-11 DIAGNOSIS — I359 Nonrheumatic aortic valve disorder, unspecified: Secondary | ICD-10-CM

## 2014-01-11 HISTORY — PX: ENDOSCOPIC RETROGRADE CHOLANGIOPANCREATOGRAPHY (ERCP) WITH PROPOFOL: SHX5810

## 2014-01-11 LAB — CBC
HCT: 22.7 % — ABNORMAL LOW (ref 36.0–46.0)
HEMOGLOBIN: 7.4 g/dL — AB (ref 12.0–15.0)
MCH: 28 pg (ref 26.0–34.0)
MCHC: 32.6 g/dL (ref 30.0–36.0)
MCV: 86 fL (ref 78.0–100.0)
Platelets: 77 10*3/uL — ABNORMAL LOW (ref 150–400)
RBC: 2.64 MIL/uL — ABNORMAL LOW (ref 3.87–5.11)
RDW: 13 % (ref 11.5–15.5)
WBC: 19.6 10*3/uL — ABNORMAL HIGH (ref 4.0–10.5)

## 2014-01-11 LAB — COMPREHENSIVE METABOLIC PANEL
ALBUMIN: 2 g/dL — AB (ref 3.5–5.2)
ALT: 104 U/L — AB (ref 0–35)
AST: 63 U/L — AB (ref 0–37)
Alkaline Phosphatase: 130 U/L — ABNORMAL HIGH (ref 39–117)
Anion gap: 12 (ref 5–15)
BILIRUBIN TOTAL: 1.3 mg/dL — AB (ref 0.3–1.2)
BUN: 31 mg/dL — AB (ref 6–23)
CHLORIDE: 108 meq/L (ref 96–112)
CO2: 20 mEq/L (ref 19–32)
Calcium: 8.5 mg/dL (ref 8.4–10.5)
Creatinine, Ser: 1.52 mg/dL — ABNORMAL HIGH (ref 0.50–1.10)
GFR calc Af Amer: 41 mL/min — ABNORMAL LOW (ref >90)
GFR calc non Af Amer: 35 mL/min — ABNORMAL LOW (ref >90)
Glucose, Bld: 87 mg/dL (ref 70–99)
Potassium: 3.6 mEq/L — ABNORMAL LOW (ref 3.7–5.3)
SODIUM: 140 meq/L (ref 137–147)
TOTAL PROTEIN: 5.4 g/dL — AB (ref 6.0–8.3)

## 2014-01-11 LAB — CULTURE, BLOOD (ROUTINE X 2)

## 2014-01-11 LAB — PROTIME-INR
INR: 1.16 (ref 0.00–1.49)
Prothrombin Time: 15 seconds (ref 11.6–15.2)

## 2014-01-11 LAB — ABO/RH: ABO/RH(D): AB POS

## 2014-01-11 LAB — GLUCOSE, CAPILLARY: Glucose-Capillary: 83 mg/dL (ref 70–99)

## 2014-01-11 LAB — PREPARE RBC (CROSSMATCH)

## 2014-01-11 SURGERY — ENDOSCOPIC RETROGRADE CHOLANGIOPANCREATOGRAPHY (ERCP) WITH PROPOFOL
Anesthesia: General

## 2014-01-11 MED ORDER — LACTATED RINGERS IV SOLN
INTRAVENOUS | Status: DC
  Administered 2014-01-11: 14:00:00 via INTRAVENOUS

## 2014-01-11 MED ORDER — SODIUM CHLORIDE 0.9 % IV SOLN: INTRAVENOUS | Status: DC

## 2014-01-11 MED ORDER — LIDOCAINE HCL (CARDIAC) 20 MG/ML IV SOLN
INTRAVENOUS | Status: DC | PRN
  Administered 2014-01-11: 50 mg via INTRAVENOUS

## 2014-01-11 MED ORDER — INDOMETHACIN 50 MG RE SUPP
100.0000 mg | Freq: Once | RECTAL | Status: DC
  Filled 2014-01-11 (×2): qty 2

## 2014-01-11 MED ORDER — ONDANSETRON HCL 4 MG/2ML IJ SOLN
INTRAMUSCULAR | Status: DC | PRN
Start: 2014-01-11 — End: 2014-01-11
  Administered 2014-01-11: 4 mg via INTRAVENOUS

## 2014-01-11 MED ORDER — LACTATED RINGERS IV SOLN
INTRAVENOUS | Status: DC | PRN
  Administered 2014-01-11 (×2): via INTRAVENOUS

## 2014-01-11 MED ORDER — GLUCAGON HCL RDNA (DIAGNOSTIC) 1 MG IJ SOLR
INTRAMUSCULAR | Status: AC
  Filled 2014-01-11: qty 1

## 2014-01-11 MED ORDER — IOHEXOL 300 MG/ML  SOLN
INTRAMUSCULAR | Status: DC | PRN
  Administered 2014-01-11: 20 mL

## 2014-01-11 MED ORDER — FENTANYL CITRATE 0.05 MG/ML IJ SOLN
INTRAMUSCULAR | Status: DC | PRN
  Administered 2014-01-11: 25 ug via INTRAVENOUS

## 2014-01-11 MED ORDER — PIPERACILLIN-TAZOBACTAM 3.375 G IVPB
3.3750 g | Freq: Three times a day (TID) | INTRAVENOUS | Status: DC
  Administered 2014-01-11 – 2014-01-12 (×3): 3.375 g via INTRAVENOUS
  Filled 2014-01-11 (×6): qty 50

## 2014-01-11 MED ORDER — INDOMETHACIN 50 MG RE SUPP
100.0000 mg | Freq: Once | RECTAL | Status: AC
  Administered 2014-01-11: 100 mg via RECTAL
  Filled 2014-01-11: qty 2

## 2014-01-11 MED ORDER — MEPERIDINE HCL 100 MG/ML IJ SOLN: 6.2500 mg | INTRAMUSCULAR | Status: DC | PRN

## 2014-01-11 MED ORDER — PROPOFOL 10 MG/ML IV BOLUS
INTRAVENOUS | Status: DC | PRN
  Administered 2014-01-11: 70 mg via INTRAVENOUS

## 2014-01-11 MED ORDER — FENTANYL CITRATE 0.05 MG/ML IJ SOLN: 25.0000 ug | INTRAMUSCULAR | Status: DC | PRN

## 2014-01-11 MED ORDER — SUCCINYLCHOLINE CHLORIDE 20 MG/ML IJ SOLN
INTRAMUSCULAR | Status: DC | PRN
  Administered 2014-01-11: 80 mg via INTRAVENOUS

## 2014-01-11 MED ORDER — PROMETHAZINE HCL 25 MG/ML IJ SOLN: 6.2500 mg | INTRAMUSCULAR | Status: DC | PRN

## 2014-01-11 MED ORDER — PHENYLEPHRINE HCL 10 MG/ML IJ SOLN
INTRAMUSCULAR | Status: DC | PRN
  Administered 2014-01-11 (×5): 80 ug via INTRAVENOUS

## 2014-01-11 NOTE — Progress Notes (Signed)
Pt just returned to floor from recovery after being in endoscopy. Pt is alert but a little confused after procedure. Pt's vital signs are within normal limits. Pt has family at bedside. Pt's daughter spoke with MD on phone. Pt resting.

## 2014-01-11 NOTE — Progress Notes (Signed)
Pt is still off floor in endoscopy.

## 2014-01-11 NOTE — Anesthesia Postprocedure Evaluation (Signed)
  Anesthesia Post-op Note  Patient: Katelyn [name]  Procedure(s) Performed: Procedure(s): ENDOSCOPIC RETROGRADE CHOLANGIOPANCREATOGRAPHY (ERCP) WITH PROPOFOL (N/A)  Patient Location: PACU  Anesthesia Type:General  Level of Consciousness: awake, alert  and oriented  Airway and Oxygen Therapy: Patient Spontanous Breathing  Post-op Pain: none  Post-op Assessment: Post-op Vital signs reviewed  Post-op Vital Signs: Reviewed  Last Vitals:  Filed Vitals:   01/11/14 1730  BP:   Pulse: 89  Temp:   Resp: 21    Complications: No apparent anesthesia complications

## 2014-01-11 NOTE — Progress Notes (Signed)
PROGRESS NOTE  Katelyn [name] GQQ:[id number] DOB: [birth year] DOA: 01/07/2014 PCP: Milagros Evener, MD   Assessment/Plan: Acute gallstone pancreatitis: clinically improved with lipase now normalized and LFT's trending down.  -Hida scan unrevealing.  -CT scan noting obstructing mass or gallstones?? -per GI rec's, patient to have ERCP today 11/18  Klebsiella oxytoca bacteremia: presenting with Septic shock:  -no further fever -continue zosyn -WBC's slowly trending down.  Hypoxia: Suspect this may be more from poor inspiratory effort because of pancreatitis.  -Chest x-ray unrevealing acute cardiopulmonary process -Patient breathing comfortably and denying SOB -continue PRN oxygen supplementation   Hypercholesteremia: Holding statin for now (given NPO and acute pancreatitis/transaminitis   Essential hypertension: holding antihypertensives given hypotension  History of CVA with no residual deficits -will monitor  Acute renal failure in the setting of CKD (chronic kidney disease) stage 3, GFR 30-59 ml/min: -continue IVF's -Cr trending down -good urine output -Cr. 1.5  Chronic Diastolic dysfunction: Seen on previous echo. Patient denies SOB or orthopnea. No crackles on exam and no edema. -will hold on diuresis  -daily weight -Strict intake and output -BNP elevation most likely from renal failure  Anemia: Unclear etiology with normal MCV. Dropped further since yesterday which may be from initial hemoconcentration and some hemodilution now with IVF's.  -follow on FOBT -no need for transfusion currently -will monitor  Code Status: full code  Family Communication: no family at bedside  Disposition Plan: continue in stepdown.   Consultants:  gastroenterology  Procedures:  none  Antibiotics:  none   Subjective: Afebrile. Denies Nausea, vomiting, CP and SOB.  Objective: BP 93/66 mmHg  Pulse 97  Temp(Src) 98 F (36.7 C) (Oral)  Resp 18  Ht 4\' 6"  (1.372  m)  Wt 49.9 kg (110 lb 0.2 oz)  BMI 26.51 kg/m2  SpO2 99%  Intake/Output Summary (Last 24 hours) at 01/11/14 1235 Last data filed at 01/11/14 0844  Gross per 24 hour  Intake   2400 ml  Output    350 ml  Net   2050 ml   Filed Weights   01/08/14 0534 01/08/14 1941 01/09/14 1850  Weight: 48.8 kg (107 lb 9.4 oz) 49.9 kg (110 lb 0.2 oz) 49.9 kg (110 lb 0.2 oz)    Exam:   General:  Alert and oriented 3, no acute distress, fatigued and tired. Denies Nausea and vomiting  Cardiovascular: regular rate and rhythm, S1-S2  Respiratory: decreased breath sounds bibasilar  Abdomen: soft, nontender, nondistended, hypoactive but positive bowel sounds  Musculoskeletal:no clubbing or cyanosis or edema   Data Reviewed: Basic Metabolic Panel:  Recent Labs Lab 01/07/14 2252 01/08/14 0700 01/09/14 0517 01/10/14 0838 01/11/14 0351  NA 137 137 143 138 140  K 4.3 3.7 4.2 3.8 3.6*  CL 100 101 109 103 108  CO2 23 24 18* 20 20  GLUCOSE 144* 122* 87 105* 87  BUN 40* 38* 42* 39* 31*  CREATININE 1.29* 1.34* 1.92* 1.93* 1.52*  CALCIUM 9.9 9.3 8.4 8.2* 8.5   Liver Function Tests:  Recent Labs Lab 01/07/14 2252 01/08/14 0700 01/09/14 0517 01/10/14 0838 01/11/14 0351  AST 281* 585* 215* 120* 63*  ALT 128* 363* 229* 151* 104*  ALKPHOS 148* 201* 161* 147* 130*  BILITOT 0.6 1.5* 1.8* 2.1* 1.3*  PROT 8.2 7.0 6.0 5.8* 5.4*  ALBUMIN 3.8 3.4* 2.5* 2.3* 2.0*    Recent Labs Lab 01/07/14 2252 01/08/14 0700 01/09/14 0517 01/10/14 0324  LIPASE 2320* 1099* 50 92*   CBC:  Recent Labs Lab 01/07/14 2252  01/08/14 0700 01/09/14 0517 01/09/14 1528 01/10/14 0838 01/11/14 0351  WBC 12.7* 14.6* 21.3* 8.7 22.4* 19.6*  NEUTROABS 10.4*  --   --   --   --   --   HGB 10.4* 9.4* 8.6* 9.7* 8.0* 7.4*  HCT 32.7* 29.6* 26.7* 30.2* 24.1* 22.7*  MCV 92.6 93.1 92.4 92.1 89.9 86.0  PLT 218 165 113* 111* 76* 77*   BNP (last 3 results)  Recent Labs  01/09/14 0517  PROBNP 5933.0*    CBG:  Recent Labs Lab 01/11/14 0942  GLUCAP 83    Recent Results (from the past 240 hour(s))  Culture, blood (routine x 2)     Status: None   Collection Time: 01/08/14  6:32 PM  Result Value Ref Range Status   Specimen Description BLOOD LEFT HAND  Final   Special Requests BOTTLES DRAWN AEROBIC AND ANAEROBIC 10CC EA  Final   Culture  Setup Time   Final    01/09/2014 03:32 Performed at [facility]    Culture   Final    KLEBSIELLA OXYTOCA Note: SUSCEPTIBILITIES PERFORMED ON PREVIOUS CULTURE WITHIN THE LAST 5 DAYS. Note: Gram Stain Report Called to,Read Back By and Verified With: ASHLEY@12 :05PM ON 01/09/14 BY DANTS Performed at [facility]    Report Status 01/11/2014 FINAL  Final  Culture, blood (routine x 2)     Status: None   Collection Time: 01/08/14  6:38 PM  Result Value Ref Range Status   Specimen Description BLOOD LEFT ANTECUBITAL  Final   Special Requests BOTTLES DRAWN AEROBIC AND ANAEROBIC 10CC EA  Final   Culture  Setup Time   Final    01/09/2014 03:32 Performed at [facility]    Culture   Final    KLEBSIELLA OXYTOCA Note: Gram Stain Report Called to,Read Back By and Verified With: ASHLEY@12 :05PM ON 01/09/14 BY DANTS Performed at [facility]    Report Status 01/11/2014 FINAL  Final   Organism ID, Bacteria KLEBSIELLA OXYTOCA  Final      Susceptibility   Klebsiella oxytoca - MIC*    AMPICILLIN >=32 RESISTANT Resistant     AMPICILLIN/SULBACTAM 8 SENSITIVE Sensitive     CEFAZOLIN <=4 SENSITIVE Sensitive     CEFEPIME <=1 SENSITIVE Sensitive     CEFTAZIDIME <=1 SENSITIVE Sensitive     CEFTRIAXONE <=1 SENSITIVE Sensitive     CIPROFLOXACIN <=0.25 SENSITIVE Sensitive     GENTAMICIN <=1 SENSITIVE Sensitive     IMIPENEM <=0.25 SENSITIVE Sensitive     PIP/TAZO <=4 SENSITIVE Sensitive     TOBRAMYCIN <=1 SENSITIVE Sensitive     TRIMETH/SULFA <=20 SENSITIVE Sensitive     * KLEBSIELLA OXYTOCA  MRSA PCR Screening     Status: None    Collection Time: 01/09/14  6:36 PM  Result Value Ref Range Status   MRSA by PCR NEGATIVE NEGATIVE Final    Comment:        The GeneXpert MRSA Assay (FDA approved for NASAL specimens only), is one component of a comprehensive MRSA colonization surveillance program. It is not intended to diagnose MRSA infection nor to guide or monitor treatment for MRSA infections.   Clostridium Difficile by PCR     Status: None   Collection Time: 01/10/14  4:34 PM  Result Value Ref Range Status   C difficile by pcr NEGATIVE NEGATIVE Final     Studies: No results found.  Scheduled Meds: . antiseptic oral rinse  7 mL Mouth Rinse BID  . piperacillin-tazobactam (ZOSYN)  IV  3.375 g Intravenous 3 times per day    Continuous Infusions: . sodium chloride    . dextrose 5 % and 0.45 % NaCl with KCl 10 mEq/L 100 mL/hr at 01/09/14 2016     Time spent: 30 minutes  Barton Dubois  [facility] Pager [phone]. If 7PM-7AM, please contact night-coverage at [email or web address], password [facility] 01/11/2014, 12:35 PM  LOS: 4 days

## 2014-01-11 NOTE — Progress Notes (Signed)
Echocardiogram 2D Echocardiogram has been performed.  Katelyn [name] 01/11/2014, 12:35 PM 

## 2014-01-11 NOTE — Transfer of Care (Signed)
Immediate Anesthesia Transfer of Care Note  Patient: Katelyn [name]  Procedure(s) Performed: Procedure(s): ENDOSCOPIC RETROGRADE CHOLANGIOPANCREATOGRAPHY (ERCP) WITH PROPOFOL (N/A)  Patient Location: PACU  Anesthesia Type:General  Level of Consciousness: awake  Airway & Oxygen Therapy: Patient Spontanous Breathing and Patient connected to face mask oxygen  Post-op Assessment: Report given to PACU RN and Post -op Vital signs reviewed and stable  Post vital signs: Reviewed and stable  Complications: No apparent anesthesia complications

## 2014-01-11 NOTE — Progress Notes (Signed)
For ERCP today. Uneventful night., LFTs trending down.

## 2014-01-11 NOTE — Plan of Care (Signed)
Problem: Phase II Progression Outcomes Goal: Progress activity as tolerated unless otherwise ordered Outcome: Progressing Goal: Discharge plan established Outcome: Progressing Goal: Vital signs remain stable Outcome: Progressing Goal: IV changed to normal saline lock Outcome: Progressing Goal: Obtain order to discontinue catheter if appropriate Outcome: Not Applicable Date Met:  41/55/16

## 2014-01-11 NOTE — Progress Notes (Signed)
ANTIBIOTIC CONSULT NOTE - FOLLOW UP  Pharmacy Consult for Zosyn Indication: Klebsiella oxytoca bacteremia  No Known Allergies  Patient Measurements: Height: 4\' 6"  (137.2 cm) Weight: 110 lb 0.2 oz (49.9 kg) IBW/kg (Calculated) : 31.7  Vital Signs: Temp: 98.5 F (36.9 C) (11/18 0535) Temp Source: Oral (11/18 0535) BP: 79/49 mmHg (11/18 0535) Pulse Rate: 99 (11/18 0535) Intake/Output from previous day: 11/17 0701 - 11/18 0700 In: 2500 [P.O.:200; I.V.:2200; IV Piggyback:100] Out: 250 [Urine:250] Intake/Output from this shift:    Labs:  Recent Labs  01/09/14 0517 01/09/14 1528 01/10/14 0838 01/11/14 0351  WBC 21.3* 8.7 22.4* 19.6*  HGB 8.6* 9.7* 8.0* 7.4*  PLT 113* 111* 76* 77*  CREATININE 1.92*  --  1.93* 1.52*   Estimated Creatinine Clearance: 23 mL/min (by C-G formula based on Cr of 1.52). No results for input(s): VANCOTROUGH, VANCOPEAK, VANCORANDOM, GENTTROUGH, GENTPEAK, GENTRANDOM, TOBRATROUGH, TOBRAPEAK, TOBRARND, AMIKACINPEAK, AMIKACINTROU, AMIKACIN in the last 72 hours.   Microbiology: Recent Results (from the past 720 hour(s))  Culture, blood (routine x 2)     Status: None (Preliminary result)   Collection Time: 01/08/14  6:32 PM  Result Value Ref Range Status   Specimen Description BLOOD LEFT HAND  Final   Special Requests BOTTLES DRAWN AEROBIC AND ANAEROBIC 10CC EA  Final   Culture  Setup Time   Final    01/09/2014 03:32 Performed at [facility]    Culture   Final    GRAM NEGATIVE RODS Note: Gram Stain Report Called to,Read Back By and Verified With: ASHLEY@12 :05PM ON 01/09/14 BY DANTS Performed at [facility]    Report Status PENDING  Incomplete  Culture, blood (routine x 2)     Status: None (Preliminary result)   Collection Time: 01/08/14  6:38 PM  Result Value Ref Range Status   Specimen Description BLOOD LEFT ANTECUBITAL  Final   Special Requests BOTTLES DRAWN AEROBIC AND ANAEROBIC 10CC EA  Final   Culture  Setup Time   Final     01/09/2014 03:32 Performed at [facility]    Culture   Final    GRAM NEGATIVE RODS Note: Gram Stain Report Called to,Read Back By and Verified With: ASHLEY@12 :05PM ON 01/09/14 BY DANTS Performed at [facility]    Report Status PENDING  Incomplete  MRSA PCR Screening     Status: None   Collection Time: 01/09/14  6:36 PM  Result Value Ref Range Status   MRSA by PCR NEGATIVE NEGATIVE Final    Comment:        The GeneXpert MRSA Assay (FDA approved for NASAL specimens only), is one component of a comprehensive MRSA colonization surveillance program. It is not intended to diagnose MRSA infection nor to guide or monitor treatment for MRSA infections.   Clostridium Difficile by PCR     Status: None   Collection Time: 01/10/14  4:34 PM  Result Value Ref Range Status   C difficile by pcr NEGATIVE NEGATIVE Final    Anti-infectives    Start     Dose/Rate Route Frequency Ordered Stop   01/11/14 1400  piperacillin-tazobactam (ZOSYN) IVPB 3.375 g     3.375 g12.5 mL/hr over 240 Minutes Intravenous 3 times per day 01/11/14 0750     01/10/14 1200  piperacillin-tazobactam (ZOSYN) IVPB 2.25 g  Status:  Discontinued     2.25 g100 mL/hr over 30 Minutes Intravenous 4 times per day 01/10/14 1020 01/11/14 0750   01/08/14 2200  piperacillin-tazobactam (ZOSYN) IVPB 3.375 g  Status:  Discontinued     3.375 g12.5 mL/hr over 240 Minutes Intravenous 3 times per day 01/08/14 1815 01/10/14 1020      Assessment: 3 YOF who continues on Zosyn for GNR bacteremia - now speciated out to be Klebsiella oxytoca - pan-sensitive except resistant to ampicillin. Due to the patient's improvement in renal function, will increase Zosyn dose today.   Given the fact that the Klebsiella oxytoca is fairly sensitive there are more narrow antibiotic choices that can be given. Consider de-escalating to Ancef or Unasyn (if anaerobic coverage still desired).   Goal of Therapy:  Proper antibiotics for  infection/cultures adjusted for renal/hepatic function   Plan:  1. Increase Zosyn to 3.375g IV every 8 hours 2. Consider de-escalating to Ancef or Unasyn (if anaerobic coverage still desired) 3. Will continue to follow renal function, culture results, LOT, and antibiotic de-escalation plans   Alycia Rossetti, PharmD, BCPS Clinical Pharmacist Pager: [phone] 01/11/2014 2:11 PM

## 2014-01-11 NOTE — Op Note (Signed)
[facility] [place], [address]   ERCP PROCEDURE REPORT        EXAM DATE: 01/11/2014  PATIENT NAME:          Katelyn [name], Katelyn [name]          MR #: [id number] BIRTHDATE:       February 22, 1951     VISIT #:     [phone] ATTENDING:     Gatha Mayer, MD, [facility]     STATUS:     outpatient  ASSISTANT:      Jiles Harold and Cleda Daub   INDICATIONS:  The patient is a 62 yr old female here for an ERCP due to pancreatitis, ? gallstone in origin, ? pancreatic mass on imaging. PROCEDURE PERFORMED:     ERCP with stent placement MEDICATIONS:     Per Anesthesia  CONSENT: The patient understands the risks and benefits of the procedure and understands that these risks include, but are not limited to: sedation, allergic reaction, infection, perforation and/or bleeding. Alternative means of evaluation and treatment include, among others: physical exam, x-rays, and/or surgical intervention. The patient elects to proceed with this endoscopic procedure.  DESCRIPTION OF PROCEDURE: During intra-op preparation period all mechanical & medical equipment was checked for proper function. Hand hygiene and appropriate measures for infection prevention was taken. After the risks, benefits and alternatives of the procedure were thoroughly explained, Informed was verified, confirmed and timeout was successfully executed by the treatment team. With the patient in left semi-prone position, medications were administered intravenously.The Pentax Ercp Scope [id number] was passed from the mouth into the esophagus and further advanced from the esophagus into the stomach. From stomach scope was directed to the second portion of the duodenum.  Major papilla was aligned with the duodenoscope. The scope position was confirmed fluoroscopically. Rest of the findings/therapeutics are given below. The scope was then completely withdrawn from the patient and the  procedure completed. The pulse, BP, and O2 saturation were monitored and documented by the physician and the nursing staff throughout the entire procedure. The patient was cared for as planned according to standard protocol. The patient was then discharged to recovery in stable condition and with appropriate post procedure care.  1) Edematous folds around papilla that made it hard to identify the orifice but it was found and cannulated with sortwire system and then contrast injected 2) Bilary tree showed mildly dilated CBD/CHD and no stones.  Cystic duct filled. 3) The papilla and folds surrounding were edematous and distorted ( I think from the pancreatitis - nothing looked neoplastic) that I elected not to do a sphincterotomy and placed a 5 cm 10 Fr biliary stent 4) no pancreatogram obtained - unable.    ADVERSE EVENT:     There were no complications. IMPRESSIONS:     1) Edematous folds around papilla that made it hard to identify the orifice but it was found and cannulated. 2) Bilary tree showed mildly dilated CBD/CHD and no stones.  Cystic duct filled. 3) The papilla and folds surrounding were edematous and distorted ( I think from the pancreatitis - nothing looked neoplastic) that I elected not to do a sphincterotomy and placed a 5 cm 10 Fr biliary stent 4) no pancreatogram obtained - unable  RECOMMENDATIONS:     Observe eventual repeat ERCP +/- EUS to sort things out She does not appear to have any CBD stones - await final reading so may just need stent pull and an EUS after  she recovers    ___________________________________ Gatha Mayer, MD, [facility] eSigned:  Gatha Mayer, MD, [facility] 01/11/2014 5:05 PM          PATIENT NAME:  Katelyn [name], Katelyn [name] MR#: [id number]

## 2014-01-11 NOTE — Anesthesia Preprocedure Evaluation (Addendum)
Anesthesia Evaluation  Patient identified by MRN, date of birth, ID band  Reviewed: Allergy & Precautions, H&P , Patient's Chart, lab work & pertinent test results  Airway        Dental   Pulmonary Current Smoker,          Cardiovascular hypertension, Pt. on medications     Neuro/Psych CVA    GI/Hepatic   Endo/Other    Renal/GU      Musculoskeletal   Abdominal   Peds  Hematology  (+) anemia , H/H 7.2/22, will order T&C, begin 1 unit before procedure   Anesthesia Other Findings   Reproductive/Obstetrics                            Anesthesia Physical Anesthesia Plan  ASA: III and emergent  Anesthesia Plan: General   Post-op Pain Management:    Induction: Rapid sequence and Intravenous  Airway Management Planned: Oral ETT  Additional Equipment:   Intra-op Plan:   Post-operative Plan: Extubation in OR  Informed Consent: I have reviewed the patients History and Physical, chart, labs and discussed the procedure including the risks, benefits and alternatives for the proposed anesthesia with the patient or authorized representative who has indicated his/her understanding and acceptance.     Plan Discussed with:   Anesthesia Plan Comments: (Have talked to GI Dr.  Aletha Halim start blood before beginning procedure.  Patient looks better to the eye than numbers indicate)       Anesthesia Quick Evaluation

## 2014-01-11 NOTE — Progress Notes (Signed)
Received report from recovery nurse after pt got back from having an ERCP procedure. It was reported to 2c floor nurse that pt received 1 unit of blood before procedure and did not need to receive any more blood. There is a second unit in blood bank but recovery nurse stated she spoke with MD and anesthesiologist and pt did not need 2 unit. Pt is resting with vital signs within normal limits and complains of no pain at this time. Will pass on to night shift nurse.

## 2014-01-12 ENCOUNTER — Encounter: Payer: Self-pay | Admitting: Physician Assistant

## 2014-01-12 ENCOUNTER — Other Ambulatory Visit: Payer: Self-pay | Admitting: Physician Assistant

## 2014-01-12 ENCOUNTER — Encounter (HOSPITAL_COMMUNITY): Payer: Self-pay | Admitting: Internal Medicine

## 2014-01-12 DIAGNOSIS — K851 Biliary acute pancreatitis without necrosis or infection: Secondary | ICD-10-CM

## 2014-01-12 DIAGNOSIS — R5381 Other malaise: Secondary | ICD-10-CM | POA: Insufficient documentation

## 2014-01-12 DIAGNOSIS — K859 Acute pancreatitis without necrosis or infection, unspecified: Secondary | ICD-10-CM | POA: Insufficient documentation

## 2014-01-12 LAB — HEPATIC FUNCTION PANEL
ALBUMIN: 1.9 g/dL — AB (ref 3.5–5.2)
ALK PHOS: 167 U/L — AB (ref 39–117)
ALT: 70 U/L — ABNORMAL HIGH (ref 0–35)
AST: 33 U/L (ref 0–37)
BILIRUBIN INDIRECT: 0.6 mg/dL (ref 0.3–0.9)
Bilirubin, Direct: 1.2 mg/dL — ABNORMAL HIGH (ref 0.0–0.3)
TOTAL PROTEIN: 5.6 g/dL — AB (ref 6.0–8.3)
Total Bilirubin: 1.8 mg/dL — ABNORMAL HIGH (ref 0.3–1.2)

## 2014-01-12 LAB — AMYLASE: AMYLASE: 43 U/L (ref 0–105)

## 2014-01-12 LAB — OCCULT BLOOD X 1 CARD TO LAB, STOOL: Fecal Occult Bld: NEGATIVE

## 2014-01-12 MED ORDER — ENSURE COMPLETE PO LIQD
237.0000 mL | Freq: Two times a day (BID) | ORAL | Status: DC
  Administered 2014-01-12 – 2014-01-13 (×2): 237 mL via ORAL

## 2014-01-12 MED ORDER — CEFAZOLIN SODIUM 1-5 GM-% IV SOLN
1.0000 g | Freq: Two times a day (BID) | INTRAVENOUS | Status: DC
  Administered 2014-01-12 – 2014-01-13 (×3): 1 g via INTRAVENOUS
  Filled 2014-01-12 (×5): qty 50

## 2014-01-12 NOTE — Progress Notes (Signed)
Subjective  No complaints this am, s/p ERCP last night   Objective  Denies abdominal pain, slept well. ERCP did not show any stones in the bile duct and no mass. There was ampullary edema likely from pancreatitis.  Dr Carlean Purl placed a stent which will need to be removed in about 4 weeks. LFT's this morning still elevated as expected from the procedure.  Vital signs in last 24 hours: Temp:  [97.4 F (36.3 C)-98.5 F (36.9 C)] 97.8 F (36.6 C) (11/19 0456) Pulse Rate:  [61-110] 78 (11/19 0500) Resp:  [14-26] 17 (11/19 0500) BP: (80-118)/(60-80) 111/80 mmHg (11/19 0500) SpO2:  [90 %-100 %] 93 % (11/19 0500) Last BM Date: 01/11/14 General:    white female in NAD Heart:  Regular rate and rhythm; no murmurs Lungs: Respirations even and unlabored, lungs CTA bilaterally Abdomen:  Soft, nontender and nondistended. Normal bowel sounds. Extremities:  Without edema. Neurologic:  Alert and oriented,  grossly normal neurologically. Psych:  Cooperative. Normal mood and affect.  Intake/Output from previous day: 11/18 0701 - 11/19 0700 In: 2582.7 [I.V.:2300; Blood:232.7; IV Piggyback:50] Out: 100 [Urine:100] Intake/Output this shift:    Lab Results:  Recent Labs  01/09/14 1528 01/10/14 0838 01/11/14 0351  WBC 8.7 22.4* 19.6*  HGB 9.7* 8.0* 7.4*  HCT 30.2* 24.1* 22.7*  PLT 111* 76* 77*   BMET  Recent Labs  01/10/14 0838 01/11/14 0351  NA 138 140  K 3.8 3.6*  CL 103 108  CO2 20 20  GLUCOSE 105* 87  BUN 39* 31*  CREATININE 1.93* 1.52*  CALCIUM 8.2* 8.5   LFT  Recent Labs  01/12/14 0343  PROT 5.6*  ALBUMIN 1.9*  AST 33  ALT 70*  ALKPHOS 167*  BILITOT 1.8*  BILIDIR 1.2*  IBILI 0.6   PT/INR  Recent Labs  01/11/14 0351  LABPROT 15.0  INR 1.16    Studies/Results: Dg Ercp  01/11/2014   CLINICAL DATA:  Possible ampullary mass with possible common bile duct stone  EXAM: ERCP  TECHNIQUE: Multiple spot images obtained with the fluoroscopic device and submitted  for interpretation post-procedure.  COMPARISON:  01/09/2014, 01/08/2014  FINDINGS: 147 seconds of fluoroscopy was utilized. Five spot films were obtained. The initial images show the endoscope in place. Injection was then performed common bile duct with suggestion of filling defects within the common bile duct and cystic duct. A plastic biliary stent was then placed. Flow of contrast material into the duodenum is noted.  IMPRESSION: Possible filling defects within the common bile duct and cystic duct as described. Correlation with operative findings is recommended.  These images were submitted for radiologic interpretation only. Please see the procedural report for the amount of contrast and the fluoroscopy time utilized.   Electronically Signed   By: Inez Catalina M.D.   On: 01/11/2014 18:07       Assessment / Plan:   ?? Gall stone pancreatitis, no gall stones found on ERCP, biliary stent placed  To facilitate drainage  Through mildly dilated CBD Biliary stent will need to be removed in 4 weeks Follow up appointment with Dr Carlean Purl or PA in 2-3 weeks, Follow LFT's Advance diet May d/c antibiotics from GI standpoint  Principal Problem:   Septic shock Active Problems:   Hypercholesteremia   Essential hypertension   CKD (chronic kidney disease) stage 3, GFR 30-59 ml/min   Acute gallstone pancreatitis   AKI (acute kidney injury)   Acute on chronic diastolic heart failure   Anemia  Hypoxia   Cholecystitis     LOS: 5 days   Delfin Edis  01/12/2014, 7:15 AM

## 2014-01-12 NOTE — Progress Notes (Signed)
PROGRESS NOTE  Lavana Huckeba GYB:[id number] DOB: [birth year] DOA: 01/07/2014 PCP: Milagros Evener, MD   Assessment/Plan: Acute pancreatitis unspecified type: clinically improved with lipase now normalized and LFT's just up after ERCP; but with trending down curve.  -no masses or obstructive stones seen on ERCP (11/18) -patient s/p biliary stent -per GI rec's, ok to advance diet  Klebsiella oxytoca bacteremia: presenting with Septic shock:  -no further fever and BP finally up -following sensitivity will narrow to ancef -follow WBC's trend  Hypoxia: Suspect this may be more from poor inspiratory effort because of pancreatitis.  -Chest x-ray unrevealing acute cardiopulmonary process -Patient breathing comfortably and denying SOB -continue PRN oxygen supplementation -continue flutter valve/IS use   Hypercholesteremia: Holding statin for now (given NPO and acute pancreatitis/transaminitis) -will determine about use at discharge   Essential hypertension: holding antihypertensives given hypotension  History of CVA with no residual deficits -will monitor for now -no ASA or heparin currently given thrombocytopenia  Acute renal failure in the setting of CKD (chronic kidney disease) stage 3, GFR 30-59 ml/min: -continue IVF's, but change to 46ml/hr -Cr trending down -good urine output -follow BMET in am  Chronic Diastolic dysfunction: Seen on previous echo. Patient denies SOB or orthopnea. No crackles on exam and no edema. -will continue holding on diuresis for now -continue daily weight and Strict intake and output -BNP elevation most likely from renal failure  Anemia: Unclear etiology with normal MCV. Dropped further since yesterday which may be from initial hemoconcentration and some hemodilution now with IVF's.  -follow on FOBT -received 1 unit of PRBC after ERCP on 11/18 -will monitor CBC in am to follow Hgb trend  Code Status: full code  Family Communication: no  family at bedside  Disposition Plan: will move to telemetry  Consultants:  gastroenterology  Procedures:  none  Antibiotics:  none   Subjective: Afebrile. Denies Nausea, vomiting, CP and SOB. Now tolerating diet.  Objective: BP 120/76 mmHg  Pulse 84  Temp(Src) 98.2 F (36.8 C) (Oral)  Resp 18  Ht 4\' 6"  (1.372 m)  Wt 49.9 kg (110 lb 0.2 oz)  BMI 26.51 kg/m2  SpO2 93%  Intake/Output Summary (Last 24 hours) at 01/12/14 1645 Last data filed at 01/12/14 1515  Gross per 24 hour  Intake   2150 ml  Output      0 ml  Net   2150 ml   Filed Weights   01/08/14 0534 01/08/14 1941 01/09/14 1850  Weight: 48.8 kg (107 lb 9.4 oz) 49.9 kg (110 lb 0.2 oz) 49.9 kg (110 lb 0.2 oz)    Exam:   General:  Alert and oriented 3, no acute distress, fatigued and tired. Denies Nausea and vomiting  Cardiovascular: regular rate and rhythm, S1-S2; no JVD  Respiratory: no wheezing, no crackles  Abdomen: soft, nontender, nondistended, positive bowel sounds  Musculoskeletal:no clubbing or cyanosis or edema   Data Reviewed: Basic Metabolic Panel:  Recent Labs Lab 01/07/14 2252 01/08/14 0700 01/09/14 0517 01/10/14 0838 01/11/14 0351  NA 137 137 143 138 140  K 4.3 3.7 4.2 3.8 3.6*  CL 100 101 109 103 108  CO2 23 24 18* 20 20  GLUCOSE 144* 122* 87 105* 87  BUN 40* 38* 42* 39* 31*  CREATININE 1.29* 1.34* 1.92* 1.93* 1.52*  CALCIUM 9.9 9.3 8.4 8.2* 8.5   Liver Function Tests:  Recent Labs Lab 01/08/14 0700 01/09/14 0517 01/10/14 0838 01/11/14 0351 01/12/14 0343  AST 585* 215* 120* 63* 33  ALT 363* 229*  151* 104* 70*  ALKPHOS 201* 161* 147* 130* 167*  BILITOT 1.5* 1.8* 2.1* 1.3* 1.8*  PROT 7.0 6.0 5.8* 5.4* 5.6*  ALBUMIN 3.4* 2.5* 2.3* 2.0* 1.9*    Recent Labs Lab 01/07/14 2252 01/08/14 0700 01/09/14 0517 01/10/14 0324 01/12/14 0343  LIPASE 2320* 1099* 50 92*  --   AMYLASE  --   --   --   --  43   CBC:  Recent Labs Lab 01/07/14 2252 01/08/14 0700  01/09/14 0517 01/09/14 1528 01/10/14 0838 01/11/14 0351  WBC 12.7* 14.6* 21.3* 8.7 22.4* 19.6*  NEUTROABS 10.4*  --   --   --   --   --   HGB 10.4* 9.4* 8.6* 9.7* 8.0* 7.4*  HCT 32.7* 29.6* 26.7* 30.2* 24.1* 22.7*  MCV 92.6 93.1 92.4 92.1 89.9 86.0  PLT 218 165 113* 111* 76* 77*   BNP (last 3 results)  Recent Labs  01/09/14 0517  PROBNP 5933.0*   CBG:  Recent Labs Lab 01/11/14 0942  GLUCAP 83    Recent Results (from the past 240 hour(s))  Culture, blood (routine x 2)     Status: None   Collection Time: 01/08/14  6:32 PM  Result Value Ref Range Status   Specimen Description BLOOD LEFT HAND  Final   Special Requests BOTTLES DRAWN AEROBIC AND ANAEROBIC 10CC EA  Final   Culture  Setup Time   Final    01/09/2014 03:32 Performed at [facility]    Culture   Final    KLEBSIELLA OXYTOCA Note: SUSCEPTIBILITIES PERFORMED ON PREVIOUS CULTURE WITHIN THE LAST 5 DAYS. Note: Gram Stain Report Called to,Read Back By and Verified With: ASHLEY@12 :05PM ON 01/09/14 BY DANTS Performed at [facility]    Report Status 01/11/2014 FINAL  Final  Culture, blood (routine x 2)     Status: None   Collection Time: 01/08/14  6:38 PM  Result Value Ref Range Status   Specimen Description BLOOD LEFT ANTECUBITAL  Final   Special Requests BOTTLES DRAWN AEROBIC AND ANAEROBIC 10CC EA  Final   Culture  Setup Time   Final    01/09/2014 03:32 Performed at [facility]    Culture   Final    KLEBSIELLA OXYTOCA Note: Gram Stain Report Called to,Read Back By and Verified With: ASHLEY@12 :05PM ON 01/09/14 BY DANTS Performed at [facility]    Report Status 01/11/2014 FINAL  Final   Organism ID, Bacteria KLEBSIELLA OXYTOCA  Final      Susceptibility   Klebsiella oxytoca - MIC*    AMPICILLIN >=32 RESISTANT Resistant     AMPICILLIN/SULBACTAM 8 SENSITIVE Sensitive     CEFAZOLIN <=4 SENSITIVE Sensitive     CEFEPIME <=1 SENSITIVE Sensitive     CEFTAZIDIME <=1 SENSITIVE  Sensitive     CEFTRIAXONE <=1 SENSITIVE Sensitive     CIPROFLOXACIN <=0.25 SENSITIVE Sensitive     GENTAMICIN <=1 SENSITIVE Sensitive     IMIPENEM <=0.25 SENSITIVE Sensitive     PIP/TAZO <=4 SENSITIVE Sensitive     TOBRAMYCIN <=1 SENSITIVE Sensitive     TRIMETH/SULFA <=20 SENSITIVE Sensitive     * KLEBSIELLA OXYTOCA  MRSA PCR Screening     Status: None   Collection Time: 01/09/14  6:36 PM  Result Value Ref Range Status   MRSA by PCR NEGATIVE NEGATIVE Final    Comment:        The GeneXpert MRSA Assay (FDA approved for NASAL specimens only), is one component of a comprehensive MRSA colonization  surveillance program. It is not intended to diagnose MRSA infection nor to guide or monitor treatment for MRSA infections.   Clostridium Difficile by PCR     Status: None   Collection Time: 01/10/14  4:34 PM  Result Value Ref Range Status   C difficile by pcr NEGATIVE NEGATIVE Final     Studies: Dg Ercp  01/11/2014   CLINICAL DATA:  Possible ampullary mass with possible common bile duct stone  EXAM: ERCP  TECHNIQUE: Multiple spot images obtained with the fluoroscopic device and submitted for interpretation post-procedure.  COMPARISON:  01/09/2014, 01/08/2014  FINDINGS: 147 seconds of fluoroscopy was utilized. Five spot films were obtained. The initial images show the endoscope in place. Injection was then performed common bile duct with suggestion of filling defects within the common bile duct and cystic duct. A plastic biliary stent was then placed. Flow of contrast material into the duodenum is noted.  IMPRESSION: Possible filling defects within the common bile duct and cystic duct as described. Correlation with operative findings is recommended.  These images were submitted for radiologic interpretation only. Please see the procedural report for the amount of contrast and the fluoroscopy time utilized.   Electronically Signed   By: Inez Catalina M.D.   On: 01/11/2014 18:07    Scheduled  Meds: . antiseptic oral rinse  7 mL Mouth Rinse BID  .  ceFAZolin (ANCEF) IV  1 g Intravenous Q12H  . feeding supplement (ENSURE COMPLETE)  237 mL Oral BID BM  . indomethacin  100 mg Rectal Once    Continuous Infusions: . dextrose 5 % and 0.45 % NaCl with KCl 10 mEq/L 100 mL/hr at 01/11/14 2007     Time spent: 30 minutes  Barton Dubois  [facility] Pager [phone]. If 7PM-7AM, please contact night-coverage at [email or web address], password [facility] 01/12/2014, 4:45 PM  LOS: 5 days

## 2014-01-12 NOTE — Discharge Instructions (Signed)
Please go to [facility], [place] on  Nov 23 for blood work. The lab is in the basement.

## 2014-01-12 NOTE — Progress Notes (Signed)
ANTIBIOTIC CONSULT NOTE - FOLLOW UP  Pharmacy Consult for ancef Indication: Klebsiella oxytoca bacteremia  No Known Allergies  Patient Measurements: Height: 4\' 6"  (137.2 cm) Weight: 110 lb 0.2 oz (49.9 kg) IBW/kg (Calculated) : 31.7  Vital Signs: Temp: 98.6 F (37 C) (11/19 1253) Temp Source: Oral (11/19 1253) BP: 130/80 mmHg (11/19 1253) Pulse Rate: 74 (11/19 1253) Intake/Output from previous day: 11/18 0701 - 11/19 0700 In: 2832.7 [I.V.:2500; Blood:232.7; IV Piggyback:100] Out: 100 [Urine:100] Intake/Output from this shift: Total I/O In: 400 [P.O.:100; I.V.:300] Out: -   Labs:  Recent Labs  01/09/14 1528 01/10/14 0838 01/11/14 0351  WBC 8.7 22.4* 19.6*  HGB 9.7* 8.0* 7.4*  PLT 111* 76* 77*  CREATININE  --  1.93* 1.52*   Estimated Creatinine Clearance: 23 mL/min (by C-G formula based on Cr of 1.52). No results for input(s): VANCOTROUGH, VANCOPEAK, VANCORANDOM, GENTTROUGH, GENTPEAK, GENTRANDOM, TOBRATROUGH, TOBRAPEAK, TOBRARND, AMIKACINPEAK, AMIKACINTROU, AMIKACIN in the last 72 hours.   Microbiology: Recent Results (from the past 720 hour(s))  Culture, blood (routine x 2)     Status: None   Collection Time: 01/08/14  6:32 PM  Result Value Ref Range Status   Specimen Description BLOOD LEFT HAND  Final   Special Requests BOTTLES DRAWN AEROBIC AND ANAEROBIC 10CC EA  Final   Culture  Setup Time   Final    01/09/2014 03:32 Performed at [facility]    Culture   Final    KLEBSIELLA OXYTOCA Note: SUSCEPTIBILITIES PERFORMED ON PREVIOUS CULTURE WITHIN THE LAST 5 DAYS. Note: Gram Stain Report Called to,Read Back By and Verified With: ASHLEY@12 :05PM ON 01/09/14 BY DANTS Performed at [facility]    Report Status 01/11/2014 FINAL  Final  Culture, blood (routine x 2)     Status: None   Collection Time: 01/08/14  6:38 PM  Result Value Ref Range Status   Specimen Description BLOOD LEFT ANTECUBITAL  Final   Special Requests BOTTLES DRAWN AEROBIC AND  ANAEROBIC 10CC EA  Final   Culture  Setup Time   Final    01/09/2014 03:32 Performed at [facility]    Culture   Final    KLEBSIELLA OXYTOCA Note: Gram Stain Report Called to,Read Back By and Verified With: ASHLEY@12 :05PM ON 01/09/14 BY DANTS Performed at [facility]    Report Status 01/11/2014 FINAL  Final   Organism ID, Bacteria KLEBSIELLA OXYTOCA  Final      Susceptibility   Klebsiella oxytoca - MIC*    AMPICILLIN >=32 RESISTANT Resistant     AMPICILLIN/SULBACTAM 8 SENSITIVE Sensitive     CEFAZOLIN <=4 SENSITIVE Sensitive     CEFEPIME <=1 SENSITIVE Sensitive     CEFTAZIDIME <=1 SENSITIVE Sensitive     CEFTRIAXONE <=1 SENSITIVE Sensitive     CIPROFLOXACIN <=0.25 SENSITIVE Sensitive     GENTAMICIN <=1 SENSITIVE Sensitive     IMIPENEM <=0.25 SENSITIVE Sensitive     PIP/TAZO <=4 SENSITIVE Sensitive     TOBRAMYCIN <=1 SENSITIVE Sensitive     TRIMETH/SULFA <=20 SENSITIVE Sensitive     * KLEBSIELLA OXYTOCA  MRSA PCR Screening     Status: None   Collection Time: 01/09/14  6:36 PM  Result Value Ref Range Status   MRSA by PCR NEGATIVE NEGATIVE Final    Comment:        The GeneXpert MRSA Assay (FDA approved for NASAL specimens only), is one component of a comprehensive MRSA colonization surveillance program. It is not intended to diagnose MRSA infection nor to  guide or monitor treatment for MRSA infections.   Clostridium Difficile by PCR     Status: None   Collection Time: 01/10/14  4:34 PM  Result Value Ref Range Status   C difficile by pcr NEGATIVE NEGATIVE Final    Anti-infectives    Start     Dose/Rate Route Frequency Ordered Stop   01/11/14 1400  piperacillin-tazobactam (ZOSYN) IVPB 3.375 g     3.375 g12.5 mL/hr over 240 Minutes Intravenous 3 times per day 01/11/14 0750     01/10/14 1200  piperacillin-tazobactam (ZOSYN) IVPB 2.25 g  Status:  Discontinued     2.25 g100 mL/hr over 30 Minutes Intravenous 4 times per day 01/10/14 1020 01/11/14 0750    01/08/14 2200  piperacillin-tazobactam (ZOSYN) IVPB 3.375 g  Status:  Discontinued     3.375 g12.5 mL/hr over 240 Minutes Intravenous 3 times per day 01/08/14 1815 01/10/14 1020      Assessment: 50 YOF who continues on Zosyn for GNR bacteremia - now speciated out to be Klebsiella oxytoca - pan-sensitive except resistant to ampicillin.  Spoke to Dr. Dyann Kief and ok to change to ancef. Last SCr was 1.52 (trend down noted) on 11/18 and CrCl ~ 20-25 (SCr was 1.29 on admit and was noted 1.05 in 2013)   Goal of Therapy:  Proper antibiotics for infection/cultures adjusted for renal/hepatic function   Plan:  -Ancef 1gm IV q12h -BMET in am to follow renal trend  Hildred Laser, Pharm D 01/12/2014 1:02 PM

## 2014-01-12 NOTE — Progress Notes (Addendum)
INITIAL NUTRITION ASSESSMENT  DOCUMENTATION CODES Per approved criteria  -Not Applicable   INTERVENTION: Ensure Complete po BID, each supplement provides 350 kcal and 13 grams of protein RD to follow for nutrition care plan  NUTRITION DIAGNOSIS: Inadequate oral intake related to poor appetite as evidenced by pt report  Goal: Pt to meet >/= 90% of their estimated nutrition needs   Monitor:  PO & supplemental intake, weight, labs, I/O's  Reason for Assessment: Consult  63 y.o. female  Admitting Dx: Septic shock  ASSESSMENT: 63 y.o. Female with a history of HTN, HLD who presented to ED with complaints of nausea, vomiting and abdominal pain; found to have a lipase level of 2320.  Patient s/p procedures 11/18: ERCP WITH STENT PLACEMENT  Patient reports her appetite "comes and goes;" PO intake very poor at 10% this AM; was eating well PTA (3 meals per day); reports weight stability; amenable to trying oral nutrition supplements; RD to order.  Nutrition focused physical exam completed.  No muscle or subcutaneous fat depletion noticed.  Height: Ht Readings from Last 1 Encounters:  01/09/14 4\' 6"  (1.372 m)    Weight: Wt Readings from Last 1 Encounters:  01/09/14 110 lb 0.2 oz (49.9 kg)    Ideal Body Weight: 90 lb  % Ideal Body Weight: 122%  Wt Readings from Last 10 Encounters:  01/09/14 110 lb 0.2 oz (49.9 kg)  05/06/11 112 lb (50.803 kg)  03/12/11 111 lb 15.9 oz (50.8 kg)  03/03/11 110 lb 10.7 oz (50.2 kg)  10/25/07 115 lb (52.164 kg)  12/30/06 113 lb (51.256 kg)    Usual Body Weight: 112 lb  % Usual Body Weight: 98%  BMI:  Body mass index is 26.51 kg/(m^2).  Estimated Nutritional Needs: Kcal: 1250-1450 Protein: 60-70 gm Fluid: >/= 1.5 L  Skin: Intact  Diet Order: Diet Carb Modified  EDUCATION NEEDS: -No education needs identified at this time   Intake/Output Summary (Last 24 hours) at 01/12/14 1309 Last data filed at 01/12/14 1000  Gross per  24 hour  Intake 3232.67 ml  Output      0 ml  Net 3232.67 ml    Labs:   Recent Labs Lab 01/09/14 0517 01/10/14 0838 01/11/14 0351  NA 143 138 140  K 4.2 3.8 3.6*  CL 109 103 108  CO2 18* 20 20  BUN 42* 39* 31*  CREATININE 1.92* 1.93* 1.52*  CALCIUM 8.4 8.2* 8.5  GLUCOSE 87 105* 87    CBG (last 3)   Recent Labs  01/11/14 0942  GLUCAP 83    Scheduled Meds: . antiseptic oral rinse  7 mL Mouth Rinse BID  .  ceFAZolin (ANCEF) IV  1 g Intravenous Q12H  . indomethacin  100 mg Rectal Once    Continuous Infusions: . dextrose 5 % and 0.45 % NaCl with KCl 10 mEq/L 100 mL/hr at 01/11/14 2007    Past Medical History  Diagnosis Date  . Hypertension   . Hypercholesteremia   . Shortness of breath   . Blood transfusion   . Stroke     Past Surgical History  Procedure Laterality Date  . Abdominal hysterectomy    . Tee without cardioversion  03/06/2011    Procedure: TRANSESOPHAGEAL ECHOCARDIOGRAM (TEE);  Surgeon: Jolaine Artist, MD;  Location: [facility] ENDOSCOPY;  Service: Cardiovascular;  Laterality: N/A;  . Endoscopic retrograde cholangiopancreatography (ercp) with propofol N/A 01/11/2014    Procedure: ENDOSCOPIC RETROGRADE CHOLANGIOPANCREATOGRAPHY (ERCP) WITH PROPOFOL;  Surgeon: Gatha Mayer, MD;  Location:  [facility] ENDOSCOPY;  Service: Endoscopy;  Laterality: N/A;    Arthur Holms, RD, LDN Pager #: [phone] After-Hours Pager #: [phone]

## 2014-01-12 NOTE — Progress Notes (Signed)
Patient is transferred to [facility]. Pt is alert and oriented, vital signs are stable. Report was given to RN.

## 2014-01-12 NOTE — Clinical Documentation Improvement (Addendum)
Please specify diagnosis related to below supporting information if appropriate.   Possible Clinical Conditions    Expected Acute Blood Loss Anemia  Acute Blood Loss Anemia  Acute on chronic blood loss anemia  Chronic blood loss anemia  Precipitous drop in Hematocrit  Other Condition____chronic anemia with precipitous drop in Hgb; most likely from hemodilution____________  Cannot Clinically Determine    Supporting Information:  Per 01/11/14 Anesthesia note = (+) anemia , H/H 7.2/22, will order T&C, begin 1 unit before procedure.  Per 01/11/14 MD Progress note =  Anemia: Unclear etiology with normal MCV. Dropped further since yesterday which may be from initial hemoconcentration and some hemodilution now with IVF's.  Per 01/10/14 MD Progress note = Anemia: Unclear etiology with normal MCV. Dropped further since yesterday which may be from initial hemoconcentration. Has had steady decline since admission. Will heme test stools to ensure there is no underlying bleeding. Per 01/09/14 MD Progress note = Anemia: Unclear etiology with normal MCV. Dropped further since yesterday which may be from initial hemoconcentration. May be from chronic renal disease, repeating labs this afternoon.  Per 01/08/14 MD Progress note = Anemia: Unclear etiology with normal MCV. Mild drop since yesterday which may be from initial hemoconcentration. Maybe chronic disease, we'll continue to follow, no evidence of blood loss.   Patient's labs during this admission  Component     Latest Ref Rng 01/07/2014 01/08/2014          7:00 AM  Hemoglobin     12.0 - 15.0 g/dL 10.4 (L) 9.4 (L)  HCT     36.0 - 46.0 % 32.7 (L) 29.6 (L)   Component     Latest Ref Rng 01/09/2014 01/09/2014 01/10/2014         5:17 AM  3:28 PM   Hemoglobin     12.0 - 15.0 g/dL 8.6 (L) 9.7 (L) 8.0 (L)  HCT     36.0 - 46.0 % 26.7 (L) 30.2 (L) 24.1 (L)   Component     Latest Ref Rng 01/11/2014         3:51 AM  Hemoglobin     12.0  - 15.0 g/dL 7.4 (L)  HCT     36.0 - 46.0 % 22.7 (L)      Thank You, Serena Colonel ,RN Clinical Documentation Specialist:  [facility] [employer] [facility]  New diagnosis will be added to progress note and discharge summary  Barton Dubois [phone]

## 2014-01-13 ENCOUNTER — Inpatient Hospital Stay (HOSPITAL_COMMUNITY): Payer: Medicare Other

## 2014-01-13 DIAGNOSIS — R0602 Shortness of breath: Secondary | ICD-10-CM | POA: Insufficient documentation

## 2014-01-13 LAB — CBC
HEMATOCRIT: 27.5 % — AB (ref 36.0–46.0)
HEMOGLOBIN: 9.2 g/dL — AB (ref 12.0–15.0)
MCH: 28.1 pg (ref 26.0–34.0)
MCHC: 33.5 g/dL (ref 30.0–36.0)
MCV: 84.1 fL (ref 78.0–100.0)
Platelets: 176 10*3/uL (ref 150–400)
RBC: 3.27 MIL/uL — ABNORMAL LOW (ref 3.87–5.11)
RDW: 13.5 % (ref 11.5–15.5)
WBC: 10.4 10*3/uL (ref 4.0–10.5)

## 2014-01-13 LAB — BASIC METABOLIC PANEL
ANION GAP: 13 (ref 5–15)
BUN: 23 mg/dL (ref 6–23)
CALCIUM: 8.6 mg/dL (ref 8.4–10.5)
CHLORIDE: 107 meq/L (ref 96–112)
CO2: 19 meq/L (ref 19–32)
Creatinine, Ser: 1.63 mg/dL — ABNORMAL HIGH (ref 0.50–1.10)
GFR calc Af Amer: 37 mL/min — ABNORMAL LOW (ref >90)
GFR calc non Af Amer: 32 mL/min — ABNORMAL LOW (ref >90)
GLUCOSE: 91 mg/dL (ref 70–99)
Potassium: 4.5 mEq/L (ref 3.7–5.3)
Sodium: 139 mEq/L (ref 137–147)

## 2014-01-13 LAB — HEPATIC FUNCTION PANEL
ALBUMIN: 2.2 g/dL — AB (ref 3.5–5.2)
ALK PHOS: 205 U/L — AB (ref 39–117)
ALT: 53 U/L — AB (ref 0–35)
AST: 34 U/L (ref 0–37)
BILIRUBIN TOTAL: 1.3 mg/dL — AB (ref 0.3–1.2)
Bilirubin, Direct: 0.6 mg/dL — ABNORMAL HIGH (ref 0.0–0.3)
Indirect Bilirubin: 0.7 mg/dL (ref 0.3–0.9)
TOTAL PROTEIN: 6.1 g/dL (ref 6.0–8.3)

## 2014-01-13 MED ORDER — FUROSEMIDE 10 MG/ML IJ SOLN
20.0000 mg | Freq: Once | INTRAMUSCULAR | Status: AC
  Administered 2014-01-13: 20 mg via INTRAVENOUS

## 2014-01-13 MED ORDER — CIPROFLOXACIN HCL 500 MG PO TABS
500.0000 mg | ORAL_TABLET | Freq: Two times a day (BID) | ORAL | Status: DC
  Administered 2014-01-13 – 2014-01-14 (×2): 500 mg via ORAL
  Filled 2014-01-13 (×5): qty 1

## 2014-01-13 NOTE — Evaluation (Signed)
Occupational Therapy Evaluation Patient Details Name: Katelyn [name] MRN: [id number] DOB: 06-29-1950 Today's Date: 01/13/2014    History of Present Illness Admitted with nausea and Vomiting and found to have pancreatitis. PMHx: history of HTN, Hyperlipidemia, and CVA   Clinical Impression   This 63 yo female admitted with above presents to acute OT with decrease in sats with activity, decreased balance, decreased mobility all affecting her ability to perform her BADLs at her PLOF. She will benefit from acute OT with follow up [place] to get back to her PLOF.    Follow Up Recommendations  Home health OT    Equipment Recommendations  3 in 1 bedside comode       Precautions / Restrictions Precautions Precautions: Fall Restrictions Weight Bearing Restrictions: No      Mobility Bed Mobility Overal bed mobility: Modified Independent Bed Mobility: Sit to Supine       Sit to supine: Modified independent (Device/Increase time)   General bed mobility comments: HOB, use of rail, and increased time  Transfers Overall transfer level: Needs assistance Equipment used: 1 person hand held assist Transfers: Sit to/from Stand Sit to Stand: Min assist         General transfer comment: Assist for safety    Balance Overall balance assessment: Needs assistance Sitting-balance support: No upper extremity supported;Feet supported Sitting balance-Leahy Scale: Fair     Standing balance support: Bilateral upper extremity supported Standing balance-Leahy Scale: Poor                              ADL Overall ADL's : Needs assistance/impaired Eating/Feeding: Independent;Sitting   Grooming: Wash/dry hands;Min guard;Standing   Upper Body Bathing: Set up;Sitting   Lower Body Bathing: Minimal assistance (Pt reports that fiance helps her wash her feet)   Upper Body Dressing : Set up;Sitting   Lower Body Dressing: Minimal assistance (pt reports that fiance helps her with  socks and shoes)   Toilet Transfer: Minimal assistance;Ambulation (one person HHA, and furniture walking with other hand)   Toileting- Clothing Manipulation and Hygiene: Min guard;Sit to/from Nurse, children's Details (indicate cue type and reason): Pt reports that her fiance helps her in down into tub and back out                   Pertinent Vitals/Pain Pain Assessment: No/denies pain     Hand Dominance Right   Extremity/Trunk Assessment Upper Extremity Assessment Upper Extremity Assessment: Overall WFL for tasks assessed         Communication Communication Communication: No difficulties   Cognition Arousal/Alertness: Awake/alert Behavior During Therapy: WFL for tasks assessed/performed Overall Cognitive Status: Within Functional Limits for tasks assessed                                Home Living Family/patient expects to be discharged to:: Private residence Living Arrangements: Spouse/significant other (finace) Available Help at Discharge: Available 24 hours/day;Family Type of Home: House Home Access: Stairs to enter [employer] of Steps: 2 Entrance Stairs-Rails: Left Home Layout: One level     Bathroom Shower/Tub: Tub/shower unit;Curtain Shower/tub characteristics: Architectural technologist: Standard     Home Equipment: Cane - single point;Walker - 2 wheels   Additional Comments: Has AFO for rt      Prior Functioning/Environment Level of Independence: Needs assistance  Gait / Transfers Assistance Needed: Amb outside  with cane and AFO. Doesn't use cane inside.          OT Diagnosis: Generalized weakness   OT Problem List: Decreased strength;Cardiopulmonary status limiting activity;Impaired balance (sitting and/or standing);Decreased knowledge of use of DME or AE   OT Treatment/Interventions: Self-care/ADL training;Balance training;DME and/or AE instruction;Patient/family education    OT Goals(Current goals can  be found in the care plan section) Acute Rehab OT Goals Patient Stated Goal: home soone OT Goal Formulation: With patient Time For Goal Achievement: 01/20/14 Potential to Achieve Goals: Good  OT Frequency: Min 2X/week              End of Session Equipment Utilized During Treatment: Gait belt  Activity Tolerance: Patient tolerated treatment well Patient left: in chair;with call bell/phone within reach   Time: 1101-1131 OT Time Calculation (min): 30 min Charges:  OT General Charges $OT Visit: 1 Procedure OT Evaluation $Initial OT Evaluation Tier I: 1 Procedure OT Treatments $Self Care/Home Management : 23-37 mins  Almon Register [phone] 01/13/2014, 1:47 PM

## 2014-01-13 NOTE — Plan of Care (Signed)
Problem: Phase II Progression Outcomes Goal: Progress activity as tolerated unless otherwise ordered Outcome: Completed/Met Date Met:  01/13/14     

## 2014-01-13 NOTE — Plan of Care (Signed)
Problem: Phase II Progression Outcomes Goal: Vital signs remain stable Outcome: Completed/Met Date Met:  01/13/14     

## 2014-01-13 NOTE — Evaluation (Signed)
Physical Therapy Evaluation Patient Details Name: Katelyn [name] MRN: [id number] DOB: 05-20-1950 Today's Date: 01/13/2014   History of Present Illness  Admitted with nausea and Vomiting. PMHx: history of HTN, Hyperlipidemia, and CVA  Clinical Impression  Pt admitted with above. Pt currently with functional limitations due to the deficits listed below (see PT Problem List).  Pt will benefit from skilled PT to increase their independence and safety with mobility to allow discharge home with significant other.      Follow Up Recommendations Home health PT;Supervision - Intermittent    Equipment Recommendations  None recommended by PT    Recommendations for Other Services       Precautions / Restrictions Precautions Precautions: Fall Restrictions Weight Bearing Restrictions: No      Mobility  Bed Mobility Overal bed mobility: Modified Independent Bed Mobility: Sit to Supine       Sit to supine: Modified independent (Device/Increase time)   General bed mobility comments: HOB, use of rail, and increased time  Transfers Overall transfer level: Needs assistance Equipment used: Rolling walker (2 wheeled) Transfers: Sit to/from Stand Sit to Stand: Min guard         General transfer comment: Assist for safety  Ambulation/Gait Ambulation/Gait assistance: Supervision Ambulation Distance (Feet): 35 Feet Assistive device: Rolling walker (2 wheeled) Gait Pattern/deviations: Step-through pattern;Decreased dorsiflexion - right Gait velocity: decr Gait velocity interpretation: Below normal speed for age/gender General Gait Details: Pt with rt foot dragging during swing phase.   Stairs            Wheelchair Mobility    Modified Rankin (Stroke Patients Only)       Balance Overall balance assessment: Needs assistance Sitting-balance support: No upper extremity supported;Feet supported Sitting balance-Leahy Scale: Fair     Standing balance support: No upper  extremity supported Standing balance-Leahy Scale: Fair                               Pertinent Vitals/Pain Pain Assessment: No/denies pain    Home Living Family/patient expects to be discharged to:: Private residence Living Arrangements: Spouse/significant other (finace) Available Help at Discharge: Available 24 hours/day;Family Type of Home: House Home Access: Stairs to enter Entrance Stairs-Rails: Left Entrance Stairs-Number of Steps: 2 Home Layout: One level Home Equipment: Cane - single point;Walker - 2 wheels Additional Comments: Has AFO for rt    Prior Function Level of Independence: Needs assistance   Gait / Transfers Assistance Needed: Amb outside with cane and AFO. Doesn't use cane inside.           Hand Dominance   Dominant Hand: Right    Extremity/Trunk Assessment   Upper Extremity Assessment: Overall WFL for tasks assessed           Lower Extremity Assessment: RLE deficits/detail;Generalized weakness RLE Deficits / Details: ankle 1/5       Communication   Communication: No difficulties  Cognition Arousal/Alertness: Awake/alert Behavior During Therapy: WFL for tasks assessed/performed Overall Cognitive Status: Within Functional Limits for tasks assessed                      General Comments      Exercises        Assessment/Plan    PT Assessment Patient needs continued PT services  PT Diagnosis Difficulty walking;Generalized weakness   PT Problem List Decreased strength;Decreased activity tolerance;Decreased balance;Decreased mobility  PT Treatment Interventions DME instruction;Gait training;Functional mobility training;Therapeutic activities;Therapeutic exercise;Balance  training;Patient/family education   PT Goals (Current goals can be found in the Care Plan section) Acute Rehab PT Goals Patient Stated Goal: home soon PT Goal Formulation: With patient Time For Goal Achievement: 01/20/14 Potential to Achieve Goals:  Good    Frequency Min 3X/week   Barriers to discharge        Co-evaluation               End of Session   Activity Tolerance: Patient tolerated treatment well Patient left: in bed;with call bell/phone within reach;with bed alarm set           Time: 1206-1221 PT Time Calculation (min) (ACUTE ONLY): 15 min   Charges:     PT Treatments $Gait Training: 8-22 mins   PT G Codes:          Benay Pomeroy February 12, 2014, 2:55 PM  [facility] PT [phone]

## 2014-01-13 NOTE — Progress Notes (Signed)
[facility] Progress Note  Subjective:   Tol diet. No abd pain. No N/V. Had a small formed BM today. Says each time she eats she gets the urge to have a BM and has a small formed BM. Was OOB with assistance this am--dropped sats to 19's per nurse. Sats came back to  94% once she sats down. Pt denies CP.     Objective:  Vital signs in last 24 hours: Temp:  [98.2 F (36.8 C)-99.7 F (37.6 C)] 98.6 F (37 C) (11/20 0415) Pulse Rate:  [74-96] 88 (11/20 0415) Resp:  [18-24] 20 (11/20 0415) BP: (120-137)/(76-87) 127/82 mmHg (11/20 0415) SpO2:  [91 %-97 %] 94 % (11/20 0415) Weight:  [120 lb (54.432 kg)-120 lb 13 oz (54.8 kg)] 120 lb (54.432 kg) (11/20 0415) Last BM Date: 01/12/14 General:   Alert,  Well-developed,  in NAD Heart:  Regular rate and rhythm; no murmurs Pulm;lungs clear Abdomen:  Soft, nontender and nondistended. Normal bowel sounds, without guarding, and without rebound.   Extremities:  Without edema. Neurologic:  Alert and  oriented x4;  grossly normal neurologically. Psych:  Alert and cooperative. Normal mood and affect.  Intake/Output from previous day: 11/19 0701 - 11/20 0700 In: 1300 [P.O.:250; I.V.:1000; IV Piggyback:50] Out: 150 [Urine:150] Intake/Output this shift: Total I/O In: 120 [P.O.:120] Out: 150 [Urine:150]  Lab Results:  Recent Labs  01/11/14 0351 01/13/14 0500  WBC 19.6* 10.4  HGB 7.4* 9.2*  HCT 22.7* 27.5*  PLT 77* 176   BMET  Recent Labs  01/11/14 0351 01/13/14 0500  NA 140 139  K 3.6* 4.5  CL 108 107  CO2 20 19  GLUCOSE 87 91  BUN 31* 23  CREATININE 1.52* 1.63*  CALCIUM 8.5 8.6   LFT  Recent Labs  01/13/14 0500  PROT 6.1  ALBUMIN 2.2*  AST 34  ALT 53*  ALKPHOS 205*  BILITOT 1.3*  BILIDIR 0.6*  IBILI 0.7   PT/INR  Recent Labs  01/11/14 0351  LABPROT 15.0  INR 1.16     Dg Ercp  01/11/2014   CLINICAL DATA:  Possible ampullary mass with possible common bile duct stone  EXAM: ERCP   TECHNIQUE: Multiple spot images obtained with the fluoroscopic device and submitted for interpretation post-procedure.  COMPARISON:  01/09/2014, 01/08/2014  FINDINGS: 147 seconds of fluoroscopy was utilized. Five spot films were obtained. The initial images show the endoscope in place. Injection was then performed common bile duct with suggestion of filling defects within the common bile duct and cystic duct. A plastic biliary stent was then placed. Flow of contrast material into the duodenum is noted.  IMPRESSION: Possible filling defects within the common bile duct and cystic duct as described. Correlation with operative findings is recommended.  These images were submitted for radiologic interpretation only. Please see the procedural report for the amount of contrast and the fluoroscopy time utilized.   Electronically Signed   By: Inez Catalina M.D.   On: 01/11/2014 18:07    ASSESSMENT/PLAN:  63 yo female s/p ERCP with placement of biliary stent to facilitate drainage. (? Gallstone pancreatitis, no gallstones found on ERCP). WBC nl.Pt afebril. Tolerating diet. LFTs trending down. Has f/u scheduled with GI in 2-3 weeks with repeat labs.    LOS: 6 days   Hvozdovic, Deloris Ping 01/13/2014, Pager [phone]  Attending MD note:   I have taken a history, examined the patient, and reviewed the chart. I agree with the Advanced Practitioner's impression and  recommendations. Will sigh off, pt will follow up with Dr Carlean Purl. She will likely need EUS, but this could be decided at her oupatient visit.  [facility] Pager # [phone]

## 2014-01-13 NOTE — Progress Notes (Signed)
SATURATION QUALIFICATIONS: (This note is used to comply with regulatory documentation for home oxygen)  Patient Saturations on Room Air at Rest = 98-100%  Patient Saturations on Room Air while Ambulating = 85-87%  Patient Saturations on 3Liters of oxygen while Ambulating = 98-100%  Please briefly explain why patient needs home oxygen:

## 2014-01-13 NOTE — Progress Notes (Signed)
PROGRESS NOTE  Katelyn [name] YIR:[id number] DOB: Dec 14, 1950 DOA: 01/07/2014 PCP: Milagros Evener, MD   Assessment/Plan: Acute pancreatitis unspecified type: clinically improved with lipase now normalized and LFT's just up after ERCP; but with trending down curve.  -no masses or obstructive stones seen on ERCP (11/18) -patient s/p biliary stent -per GI rec's, will follow patient in 2-3 weeks -no nausea, no vomiting and tolerating diet  Klebsiella oxytoca bacteremia: presenting with Septic shock:  -no further fever and BP finally up; non toxic -following sensitivity and after discussing with ID (Dr. Graylon Good); will treat for a total of 14 days and will narrow to cipro BID (8 days of treatment left) -follow WBC's trend  Hypoxia: Suspect this may be more from poor inspiratory effort and probably extra volume given with transfusion.  -will repeat CXR -Patient with mild SOB especially on exertion; will continue PRN oxygen supplementation -encourage/continue flutter valve/IS use  -lasix IV X 1 to be given  Hypercholesteremia: Holding statin for now (given NPO and acute pancreatitis/transaminitis) -will determine about use at discharge   Essential hypertension:  -improved BP -will add low dose lasix today -follow VS  History of CVA with no residual deficits -will monitor for now -no ASA or heparin currently given resent thrombocytopenia -plan is to continue ASA at discharge  Acute renal failure in the setting of CKD (chronic kidney disease) stage 3, GFR 30-59 ml/min: -patient encourage to drink and maintain hydration by mouth -Cr trending down (currently 1.6) -good urine output -follow BMET in am -continue holding lisinopril   Chronic Diastolic dysfunction: Seen on previous echo. Patient mildly SOB; denies orthopnea. No crackles on exam and no edema. -will continue holding on diuresis for now -continue daily weight and Strict intake and output -BNP elevation most likely  from renal failure -low sodium diet  Anemia of chronic disease with precipitous drop in hematocrit: Unclear etiology with normal MCV. Dropped further since yesterday which may be from initial hemoconcentration and some hemodilution now with IVF's.  -follow on FOBT -received 1 unit of PRBC after ERCP on 11/18 -Hgb stable at 9.4; will monitor  Code Status: full code  Family Communication: no family at bedside  Disposition Plan: work up with breathing; switch ABx's to PO' home in 1-2 days with [facility] services  Consultants:  gastroenterology  Procedures:  None  Antibiotics:  None   Subjective: Afebrile. Denies Nausea, vomiting, CP and SOB. Now tolerating diet.  Objective: BP 124/82 mmHg  Pulse 91  Temp(Src) 98.5 F (36.9 C) (Oral)  Resp 20  Ht 4\' 6"  (1.372 m)  Wt 54.432 kg (120 lb)  BMI 28.92 kg/m2  SpO2 90%  Intake/Output Summary (Last 24 hours) at 01/13/14 1532 Last data filed at 01/13/14 1415  Gross per 24 hour  Intake    490 ml  Output    300 ml  Net    190 ml   Filed Weights   01/09/14 1850 01/12/14 2100 01/13/14 0415  Weight: 49.9 kg (110 lb 0.2 oz) 54.8 kg (120 lb 13 oz) 54.432 kg (120 lb)    Exam:   General:  Alert and oriented 3, no acute distress, feeling fatigued and tired. Denies Nausea and vomiting and is tolerating diet. Patient with episode of desat (required 2-3L of oxygen)  Cardiovascular: regular rate and rhythm, S1-S2; no JVD  Respiratory: no wheezing, no crackles; decrease BS at bases  Abdomen: soft, nontender, nondistended, positive bowel sounds  Musculoskeletal:no clubbing or cyanosis or edema   Data Reviewed: Basic Metabolic Panel:  Recent Labs Lab 01/08/14 0700 01/09/14 0517 01/10/14 0838 01/11/14 0351 01/13/14 0500  NA 137 143 138 140 139  K 3.7 4.2 3.8 3.6* 4.5  CL 101 109 103 108 107  CO2 24 18* 20 20 19   GLUCOSE 122* 87 105* 87 91  BUN 38* 42* 39* 31* 23  CREATININE 1.34* 1.92* 1.93* 1.52* 1.63*  CALCIUM 9.3 8.4  8.2* 8.5 8.6   Liver Function Tests:  Recent Labs Lab 01/09/14 0517 01/10/14 0838 01/11/14 0351 01/12/14 0343 01/13/14 0500  AST 215* 120* 63* 33 34  ALT 229* 151* 104* 70* 53*  ALKPHOS 161* 147* 130* 167* 205*  BILITOT 1.8* 2.1* 1.3* 1.8* 1.3*  PROT 6.0 5.8* 5.4* 5.6* 6.1  ALBUMIN 2.5* 2.3* 2.0* 1.9* 2.2*    Recent Labs Lab 01/07/14 2252 01/08/14 0700 01/09/14 0517 01/10/14 0324 01/12/14 0343  LIPASE 2320* 1099* 50 92*  --   AMYLASE  --   --   --   --  43   CBC:  Recent Labs Lab 01/07/14 2252  01/09/14 0517 01/09/14 1528 01/10/14 0838 01/11/14 0351 01/13/14 0500  WBC 12.7*  < > 21.3* 8.7 22.4* 19.6* 10.4  NEUTROABS 10.4*  --   --   --   --   --   --   HGB 10.4*  < > 8.6* 9.7* 8.0* 7.4* 9.2*  HCT 32.7*  < > 26.7* 30.2* 24.1* 22.7* 27.5*  MCV 92.6  < > 92.4 92.1 89.9 86.0 84.1  PLT 218  < > 113* 111* 76* 77* 176  < > = values in this interval not displayed. BNP (last 3 results)  Recent Labs  01/09/14 0517  PROBNP 5933.0*   CBG:  Recent Labs Lab 01/11/14 0942  GLUCAP 83    Recent Results (from the past 240 hour(s))  Culture, blood (routine x 2)     Status: None   Collection Time: 01/08/14  6:32 PM  Result Value Ref Range Status   Specimen Description BLOOD LEFT HAND  Final   Special Requests BOTTLES DRAWN AEROBIC AND ANAEROBIC 10CC EA  Final   Culture  Setup Time   Final    01/09/2014 03:32 Performed at [facility]    Culture   Final    KLEBSIELLA OXYTOCA Note: SUSCEPTIBILITIES PERFORMED ON PREVIOUS CULTURE WITHIN THE LAST 5 DAYS. Note: Gram Stain Report Called to,Read Back By and Verified With: ASHLEY@12 :05PM ON 01/09/14 BY DANTS Performed at [facility]    Report Status 01/11/2014 FINAL  Final  Culture, blood (routine x 2)     Status: None   Collection Time: 01/08/14  6:38 PM  Result Value Ref Range Status   Specimen Description BLOOD LEFT ANTECUBITAL  Final   Special Requests BOTTLES DRAWN AEROBIC AND ANAEROBIC 10CC  EA  Final   Culture  Setup Time   Final    01/09/2014 03:32 Performed at [facility]    Culture   Final    KLEBSIELLA OXYTOCA Note: Gram Stain Report Called to,Read Back By and Verified With: ASHLEY@12 :05PM ON 01/09/14 BY DANTS Performed at [facility]    Report Status 01/11/2014 FINAL  Final   Organism ID, Bacteria KLEBSIELLA OXYTOCA  Final      Susceptibility   Klebsiella oxytoca - MIC*    AMPICILLIN >=32 RESISTANT Resistant     AMPICILLIN/SULBACTAM 8 SENSITIVE Sensitive     CEFAZOLIN <=4 SENSITIVE Sensitive     CEFEPIME <=1 SENSITIVE Sensitive     CEFTAZIDIME <=1  SENSITIVE Sensitive     CEFTRIAXONE <=1 SENSITIVE Sensitive     CIPROFLOXACIN <=0.25 SENSITIVE Sensitive     GENTAMICIN <=1 SENSITIVE Sensitive     IMIPENEM <=0.25 SENSITIVE Sensitive     PIP/TAZO <=4 SENSITIVE Sensitive     TOBRAMYCIN <=1 SENSITIVE Sensitive     TRIMETH/SULFA <=20 SENSITIVE Sensitive     * KLEBSIELLA OXYTOCA  MRSA PCR Screening     Status: None   Collection Time: 01/09/14  6:36 PM  Result Value Ref Range Status   MRSA by PCR NEGATIVE NEGATIVE Final    Comment:        The GeneXpert MRSA Assay (FDA approved for NASAL specimens only), is one component of a comprehensive MRSA colonization surveillance program. It is not intended to diagnose MRSA infection nor to guide or monitor treatment for MRSA infections.   Clostridium Difficile by PCR     Status: None   Collection Time: 01/10/14  4:34 PM  Result Value Ref Range Status   C difficile by pcr NEGATIVE NEGATIVE Final     Studies: No results found.  Scheduled Meds: . antiseptic oral rinse  7 mL Mouth Rinse BID  . ciprofloxacin  500 mg Oral BID  . feeding supplement (ENSURE COMPLETE)  237 mL Oral BID BM  . furosemide  20 mg Intravenous Once    Continuous Infusions: . dextrose 5 % and 0.45 % NaCl with KCl 10 mEq/L 10 mL/hr at 01/13/14 1346    Time spent: 30 minutes  Barton Dubois  [facility] Pager  [phone]. If 7PM-7AM, please contact night-coverage at [email or web address], password [facility] 01/13/2014, 3:32 PM  LOS: 6 days

## 2014-01-14 DIAGNOSIS — K859 Acute pancreatitis without necrosis or infection, unspecified: Secondary | ICD-10-CM | POA: Insufficient documentation

## 2014-01-14 LAB — BASIC METABOLIC PANEL
Anion gap: 15 (ref 5–15)
BUN: 18 mg/dL (ref 6–23)
CALCIUM: 8.6 mg/dL (ref 8.4–10.5)
CO2: 21 meq/L (ref 19–32)
Chloride: 107 mEq/L (ref 96–112)
Creatinine, Ser: 1.39 mg/dL — ABNORMAL HIGH (ref 0.50–1.10)
GFR calc Af Amer: 45 mL/min — ABNORMAL LOW (ref >90)
GFR calc non Af Amer: 39 mL/min — ABNORMAL LOW (ref >90)
Glucose, Bld: 74 mg/dL (ref 70–99)
POTASSIUM: 4.1 meq/L (ref 3.7–5.3)
SODIUM: 143 meq/L (ref 137–147)

## 2014-01-14 MED ORDER — CARVEDILOL 12.5 MG PO TABS: 12.5000 mg | ORAL_TABLET | Freq: Two times a day (BID) | ORAL | Status: DC

## 2014-01-14 MED ORDER — POTASSIUM CHLORIDE ER 20 MEQ PO TBCR: 20.0000 meq | EXTENDED_RELEASE_TABLET | Freq: Every day | ORAL | Status: DC

## 2014-01-14 MED ORDER — FUROSEMIDE 10 MG/ML IJ SOLN
40.0000 mg | Freq: Once | INTRAMUSCULAR | Status: AC
  Administered 2014-01-14: 40 mg via INTRAVENOUS
  Filled 2014-01-14: qty 4

## 2014-01-14 MED ORDER — FUROSEMIDE 20 MG PO TABS: 20.0000 mg | ORAL_TABLET | Freq: Every day | ORAL | Status: DC

## 2014-01-14 MED ORDER — CIPROFLOXACIN HCL 500 MG PO TABS: 500.0000 mg | ORAL_TABLET | Freq: Two times a day (BID) | ORAL | Status: DC

## 2014-01-14 NOTE — Discharge Summary (Signed)
Physician Discharge Summary  Katelyn [name] DXI:[id number] DOB: Jun 26, 1950 DOA: 01/07/2014  PCP: Milagros Evener, MD  Admit date: 01/07/2014 Discharge date: 01/14/2014  Time spent: >30 minutes  Recommendations for Outpatient Follow-up:  Check CMET to follow LFT's, electrolytes and renal function Check CBC to follow Hgb trend Reassess BP and adjust medications as needed  Discharge Diagnoses:  Septic shock due to klebsiella oxytoca bacteremia Hypercholesteremia Essential hypertension Acute on CKD (chronic kidney disease) stage 3, GFR 30-59 ml/min Acute pancreatitis on unclear etiology chronic diastolic heart failure Anemia of chronic disease with precipitous drop of hematocrit (most likely dilutional) SOB with hypoxia due to atelectasis  Physical deconditioning   Discharge Condition: stable and improved. No SOB, CP, nausea, vomiting or any acute complaints. Will discharge home with [place] [facility], [place] and [place]. Patient will follow with GI in 2-3 weeks and with PCP in 10 days.  Diet recommendation: low fat and low sodium diet  Filed Weights   01/12/14 2100 01/13/14 0415 01/14/14 0526  Weight: 54.8 kg (120 lb 13 oz) 54.432 kg (120 lb) 54.129 kg (119 lb 5.3 oz)    History of present illness:  63 y.o. female with a history of HTN, Hyperlipidemia who presents to the ED with complaints of Nausea and Vomiting and ABD pain that started 1 hour PTA. She denies hematemesis and diarrhea as well as fevers and chills. She was found to have a Lipase level of 2320. An Ultrasound of the ABD was negative for Gallstones and a Murphy's Sign. She was referred for admission for further evaluation and treatment.  Hospital Course:  Acute pancreatitis unspecified type: clinically improved with lipase now normalized and LFT's just up after ERCP; but with trending down curve.  -no masses or obstructive stones seen on ERCP (11/18) -patient s/p biliary stent -per GI rec's, will follow patient in 2-3  weeks -no nausea, no vomiting and tolerating diet  Klebsiella oxytoca bacteremia: presenting with Septic shock:  -no further fever and BP finally up; non toxic -following sensitivity and after discussing with ID (Dr. Graylon Good); will treat for a total of 14 days and will narrow to cipro BID (7 days of treatment left at discharge) -follow WBC's trend  Hypoxia: Suspect this may be more from poor inspiratory effort and probably extra volume given with transfusion.  -CXR on 11/2 no vascular congestion; mild bilateral pleural effusions and atelectasis  -significant improved with diuretics -will discharge with instructions to use flutter valve.IS  Hypercholesteremia: Holding statin for now given acute pancreatitis/transaminitis -will determine further use during follow up visit after reviewing again LFT's  Essential hypertension:  -stable with current regimen -will discharge on lasix 20mg  daily and coreg 12.5 mg BID -reassess and adjust as needed during follow up visit -patient advise to follow low sodium diet   History of CVA with no residual deficits -will continue ASA -no new neurologic deficit appreciated  Acute renal failure in the setting of CKD (chronic kidney disease) stage 3, GFR 30-59 ml/min: -patient encourage to drink and maintain hydration by mouth -Cr trending down (currently 1.3 at discharge) -good urine output -follow BMET during follow up visit -lisinopril and HCTZ discontinue due to renal failure  Chronic Diastolic dysfunction: Seen on previous echo. Patient mildly SOB; denies orthopnea. No crackles on exam and no edema. -will discharge on lasix 20mg  daily (to help with BP controlled and maintenance of volume) -BNP elevation most likely from renal failure -low sodium diet recommended along with daily weight   Anemia of chronic disease with precipitous drop  in hematocrit: Unclear etiology with normal MCV. No signs of bleeding appreciated.  -received 1 unit of PRBC  for stability around ERCP on 11/18 -Hgb stable since above 9.0 range (at discharge 9.2) -follow CBC during follow up appointment  Physical deconditioning -will discharge with arrangements for [facility] and HHRN  Procedures: ERCP with biliary stent placement 11/18  Consultations:  GI   Discharge Exam: Filed Vitals:   01/14/14 1300  BP: 116/77  Pulse: 87  Temp: 97.9 F (36.6 C)  Resp: 18    General: Alert and oriented 3, no acute distress, feeling stronger and denies SOB or CP. Denies Nausea and vomiting and is tolerating diet. Patient able to ambulate w/o oxygen and maintaining O2 sat above 92%  Cardiovascular: regular rate and rhythm, S1-S2; no JVD  Respiratory: no wheezing, no crackles; improve air movement   Abdomen: soft, nontender, nondistended, positive bowel sounds  Musculoskeletal:no clubbing or cyanosis or edema   Discharge Instructions You were cared for by a hospitalist during your hospital stay. If you have any questions about your discharge medications or the care you received while you were in the hospital after you are discharged, you can call the unit and asked to speak with the hospitalist on call if the hospitalist that took care of you is not available. Once you are discharged, your primary care physician will handle any further medical issues. Please note that NO REFILLS for any discharge medications will be authorized once you are discharged, as it is imperative that you return to your primary care physician (or establish a relationship with a primary care physician if you do not have one) for your aftercare needs so that they can reassess your need for medications and monitor your lab values.  Discharge Instructions    Diet - low sodium heart healthy    Complete by:  As directed      Discharge instructions    Complete by:  As directed   Maintain good hydration Follow a low sodium diet (less than 2 gram daily) Follow with PCP in 10 days Follow with GI  service as instructed Take medications as prescribed          Current Discharge Medication List    START taking these medications   Details  carvedilol (COREG) 12.5 MG tablet Take 1 tablet (12.5 mg total) by mouth 2 (two) times daily with a meal. Qty: 60 tablet, Refills: 1    ciprofloxacin (CIPRO) 500 MG tablet Take 1 tablet (500 mg total) by mouth 2 (two) times daily. Qty: 14 tablet, Refills: 0    furosemide (LASIX) 20 MG tablet Take 1 tablet (20 mg total) by mouth daily. Qty: 30 tablet, Refills: 1    potassium chloride 20 MEQ TBCR Take 20 mEq by mouth daily. Qty: 30 tablet, Refills: 1      CONTINUE these medications which have NOT CHANGED   Details  aspirin EC 81 MG tablet Take 81 mg by mouth daily.    Multiple Vitamin (MULITIVITAMIN WITH MINERALS) TABS Take 1 tablet by mouth daily.      STOP taking these medications     lisinopril-hydrochlorothiazide (PRINZIDE,ZESTORETIC) 20-25 MG per tablet      pravastatin (PRAVACHOL) 40 MG tablet      aspirin 325 MG tablet      lisinopril (PRINIVIL,ZESTRIL) 2.5 MG tablet        No Known Allergies Follow-up Information    Follow up with Hvozdovic, Vita Barley, PA-C On 01/23/2014.   Specialty:  Physician Assistant   Why:  1:15-be there 15 minutes early, bring all meds   Contact information:   [place] [id number] [phone]       Follow up with Milagros Evener, MD. Schedule an appointment as soon as possible for a visit in 10 days.   Specialty:  Family Medicine   Contact information:   [place] [place] [address] [phone]        The results of significant diagnostics from this hospitalization (including imaging, microbiology, ancillary and laboratory) are listed below for reference.    Significant Diagnostic Studies: Ct Abdomen Pelvis Wo Contrast  01/09/2014   CLINICAL DATA:  Upper abd pain. N/v, ?acute on chronic pancreatitis, recent MRCP GB wall thickening, no iv contrast due  to renal insufficiency  EXAM: CT ABDOMEN AND PELVIS WITHOUT CONTRAST  TECHNIQUE: Multidetector CT imaging of the abdomen and pelvis was performed following the standard protocol without IV contrast.  COMPARISON:  MRCP, 01/08/2014  FINDINGS: There is a calcification that lies along the medial wall of the second portion the duodenum adjacent to the pancreatic head. Is surrounded by low-attenuation soft tissue that measures 20 mm x 17 mm x 18 mm in size. This may be a mass at the ampulla. The stone could reside in the distal duct or be extra ductal. Potentially, this apparent mass could be protruding medially from the pancreatic head.  No other evidence of a pancreatic mass. No CT evidence of pancreatitis. There is irregular dilation of the pancreatic duct better depicted on the recent MRCP. This is likely due to chronic pancreatitis.  Gallbladder is moderately distended. There is mild wall thickening. No radiopaque gallstone is seen. The fat adjacent to the gallbladder shows no significant inflammatory stranding.  Common bile duct was better defined on the MRCP. Common bile duct is prominent, but within the normal range for this patient's age.  Subtle low-density lesion is seen in the posterior segment right lobe of the liver. This was a T2 hyperintense lesion on CT, likely a cyst. No other liver abnormalities. Spleen is unremarkable. There is adrenal gland thickening, greater on the left, likely hyperplasia. No discrete adrenal mass. Kidneys are unremarkable. Bladder is unremarkable.  Uterus is surgically absent.  No pelvic masses.  No adenopathy. There is a trace amount of ascites which lies adjacent to the liver and in the posterior pelvic recess.  There are numerous sigmoid colon diverticula. No diverticulitis. No bowel wall thickening or bowel inflammatory change. No evidence of obstruction.  Dense aortoiliac vascular calcifications are noted.  No aneurysm.  There are mild degenerative changes of the visualized  spine. Bones are demineralized. No osteoblastic or osteolytic lesions.  Small effusions with associated lung base atelectasis. Heart is normal in size. Small hiatal hernia.  IMPRESSION: 1. Possible ampullary versus pancreatic head mass containing a single calcification. This calcification could potentially reside in the distal common bile duct. The apparent mass measures 2 cm in size. Recommend followup endoscopy/ERCP. 2. Gallbladder wall thickening, but no pericholecystic inflammatory change. Gallbladder wall thickening is most likely reactive. Acute cholecystitis is felt less likely but not excluded. 3. No CT evidence of pancreatitis. 4. Trace ascites of unclear etiology. 5. Small pleural effusions with associated compressive atelectasis. 6. There are chronic findings including atherosclerotic change along the abdominal aorta and its branch vessels as well as extensive sigmoid colon diverticulosis. Status post hysterectomy.   Electronically Signed   By: Lajean Manes M.D.   On: 01/09/2014 21:19  Dg Chest 2 View  01/13/2014   CLINICAL DATA:  Shortness of breath, congestion.  EXAM: CHEST  2 VIEW  COMPARISON:  01/09/2014  FINDINGS: Small bilateral pleural effusions. Bibasilar atelectasis. Heart is normal size. Mediastinal contours within normal limits. No acute bony abnormality.  IMPRESSION: Small bilateral pleural effusions with bibasilar atelectasis.   Electronically Signed   By: Rolm Baptise M.D.   On: 01/13/2014 21:31   Mr 3d Recon At Scanner  01/08/2014   CLINICAL DATA:  Pancreatitis due to common bile duct stone K85.9 (ICD-10-CM). PMH of HTN and hyperlipidemia who is admitted for gallstone pancreatitis. Her symptoms started rather suddenly. She is a soft spoken person and drowsy from phenergan at the time of the interview. Further work up in the hospital revealed elevated liver enzymes and pancreatitis. No overt evidence of gallstones, but the CBD is 6 mm. No evidence of cholecystitis  EXAM: MRI  ABDOMEN WITHOUT AND WITH CONTRAST (INCLUDING MRCP)  TECHNIQUE: Multiplanar multisequence MR imaging of the abdomen was performed both before and after the administration of intravenous contrast. Heavily T2-weighted images of the biliary and pancreatic ducts were obtained, and three-dimensional MRCP images were rendered by post processing.  CONTRAST:  21mL MULTIHANCE GADOBENATE DIMEGLUMINE 529 MG/ML IV SOLN  COMPARISON:  Right upper quadrant ultrasound of earlier today.  FINDINGS: The majority of the exam is moderate to markedly motion degraded.  Lower chest: Cardiomegaly without pericardial or pleural effusion.  Hepatobiliary: Probable cyst in the posterior right lobe of the liver at 7 mm. Intrahepatic ducts upper normal for age. There is mild gallbladder wall thickening at 6 mm on image 22 series 8. Borderline gallbladder distension at approximately 8 cm. Favor artifactual T2 hypointensity within the dependent gallbladder and adjacent proximal common duct. Example image 18 of series 5. Not confirmed on other pulse sequences, including repeat series 7. The common duct is normal for age measuring maximally 7 mm. Other than the above presumed artifact, no evidence of choledocholithiasis.  Pancreas: Pancreatic atrophy with moderate diffuse duct dilatation and irregularity. No well-defined pancreatic mass or significant peripancreatic inflammation.  Spleen: Normal  Adrenals/Urinary Tract: Mild left adrenal thickening. Normal right adrenal gland. Mild renal cortical thinning bilaterally. No hydronephrosis.  Stomach/Bowel: Small hiatal hernia. Grossly normal large and small bowel loops.  Vascular/Lymphatic: Advanced aortic and branch vessel atherosclerosis. No abdominal adenopathy.  Other:  Trace perihepatic ascites.  Musculoskeletal: No focal osseous abnormality.  IMPRESSION: 1. Moderate to severely motion degraded exam. 2. Borderline gallbladder distention and gallbladder wall thickening, without gallstone or specific  evidence of acute cholecystitis. 3. No evidence of biliary ductal dilatation or convincing evidence of choledocholithiasis. 4. Findings of chronic pancreatitis. 5. Trace perihepatic ascites.   Electronically Signed   By: Abigail Miyamoto M.D.   On: 01/08/2014 17:36   US Abdomen Limited  01/08/2014   CLINICAL DATA:  Nnausea, vomiting, pancreatitis  EXAM: US ABDOMEN LIMITED - RIGHT UPPER QUADRANT  COMPARISON:  None.  FINDINGS: Gallbladder:  No gallstones or wall thickening visualized. No sonographic Murphy sign noted.  Common bile duct:  Diameter: 6.8 mm  Liver:  No focal lesion identified. Within normal limits in parenchymal echogenicity.  IMPRESSION: Normal right upper quadrant ultrasound.   Electronically Signed   By: Kathreen Devoid   On: 01/08/2014 03:35   Dg Chest Port 1 View  01/09/2014   CLINICAL DATA:  Hypoxia  EXAM: PORTABLE CHEST - 1 VIEW  COMPARISON:  03/03/2011  FINDINGS: The heart size and mediastinal contours are within normal  limits. Both lungs are clear. The visualized skeletal structures are unremarkable.  IMPRESSION: No active disease.   Electronically Signed   By: Inez Catalina M.D.   On: 01/09/2014 00:25   Dg Ercp  01/11/2014   CLINICAL DATA:  Possible ampullary mass with possible common bile duct stone  EXAM: ERCP  TECHNIQUE: Multiple spot images obtained with the fluoroscopic device and submitted for interpretation post-procedure.  COMPARISON:  01/09/2014, 01/08/2014  FINDINGS: 147 seconds of fluoroscopy was utilized. Five spot films were obtained. The initial images show the endoscope in place. Injection was then performed common bile duct with suggestion of filling defects within the common bile duct and cystic duct. A plastic biliary stent was then placed. Flow of contrast material into the duodenum is noted.  IMPRESSION: Possible filling defects within the common bile duct and cystic duct as described. Correlation with operative findings is recommended.  These images were submitted for  radiologic interpretation only. Please see the procedural report for the amount of contrast and the fluoroscopy time utilized.   Electronically Signed   By: Inez Catalina M.D.   On: 01/11/2014 18:07   Mr Abd W/wo Cm/mrcp  01/08/2014   CLINICAL DATA:  Pancreatitis due to common bile duct stone K85.9 (ICD-10-CM). PMH of HTN and hyperlipidemia who is admitted for gallstone pancreatitis. Her symptoms started rather suddenly. She is a soft spoken person and drowsy from phenergan at the time of the interview. Further work up in the hospital revealed elevated liver enzymes and pancreatitis. No overt evidence of gallstones, but the CBD is 6 mm. No evidence of cholecystitis  EXAM: MRI ABDOMEN WITHOUT AND WITH CONTRAST (INCLUDING MRCP)  TECHNIQUE: Multiplanar multisequence MR imaging of the abdomen was performed both before and after the administration of intravenous contrast. Heavily T2-weighted images of the biliary and pancreatic ducts were obtained, and three-dimensional MRCP images were rendered by post processing.  CONTRAST:  82mL MULTIHANCE GADOBENATE DIMEGLUMINE 529 MG/ML IV SOLN  COMPARISON:  Right upper quadrant ultrasound of earlier today.  FINDINGS: The majority of the exam is moderate to markedly motion degraded.  Lower chest: Cardiomegaly without pericardial or pleural effusion.  Hepatobiliary: Probable cyst in the posterior right lobe of the liver at 7 mm. Intrahepatic ducts upper normal for age. There is mild gallbladder wall thickening at 6 mm on image 22 series 8. Borderline gallbladder distension at approximately 8 cm. Favor artifactual T2 hypointensity within the dependent gallbladder and adjacent proximal common duct. Example image 18 of series 5. Not confirmed on other pulse sequences, including repeat series 7. The common duct is normal for age measuring maximally 7 mm. Other than the above presumed artifact, no evidence of choledocholithiasis.  Pancreas: Pancreatic atrophy with moderate diffuse duct  dilatation and irregularity. No well-defined pancreatic mass or significant peripancreatic inflammation.  Spleen: Normal  Adrenals/Urinary Tract: Mild left adrenal thickening. Normal right adrenal gland. Mild renal cortical thinning bilaterally. No hydronephrosis.  Stomach/Bowel: Small hiatal hernia. Grossly normal large and small bowel loops.  Vascular/Lymphatic: Advanced aortic and branch vessel atherosclerosis. No abdominal adenopathy.  Other:  Trace perihepatic ascites.  Musculoskeletal: No focal osseous abnormality.  IMPRESSION: 1. Moderate to severely motion degraded exam. 2. Borderline gallbladder distention and gallbladder wall thickening, without gallstone or specific evidence of acute cholecystitis. 3. No evidence of biliary ductal dilatation or convincing evidence of choledocholithiasis. 4. Findings of chronic pancreatitis. 5. Trace perihepatic ascites.   Electronically Signed   By: Abigail Miyamoto M.D.   On: 01/08/2014 17:36  Microbiology: Recent Results (from the past 240 hour(s))  Culture, blood (routine x 2)     Status: None   Collection Time: 01/08/14  6:32 PM  Result Value Ref Range Status   Specimen Description BLOOD LEFT HAND  Final   Special Requests BOTTLES DRAWN AEROBIC AND ANAEROBIC 10CC EA  Final   Culture  Setup Time   Final    01/09/2014 03:32 Performed at [facility]    Culture   Final    KLEBSIELLA OXYTOCA Note: SUSCEPTIBILITIES PERFORMED ON PREVIOUS CULTURE WITHIN THE LAST 5 DAYS. Note: Gram Stain Report Called to,Read Back By and Verified With: ASHLEY@12 :05PM ON 01/09/14 BY DANTS Performed at [facility]    Report Status 01/11/2014 FINAL  Final  Culture, blood (routine x 2)     Status: None   Collection Time: 01/08/14  6:38 PM  Result Value Ref Range Status   Specimen Description BLOOD LEFT ANTECUBITAL  Final   Special Requests BOTTLES DRAWN AEROBIC AND ANAEROBIC 10CC EA  Final   Culture  Setup Time   Final    01/09/2014 03:32 Performed at  [facility]    Culture   Final    KLEBSIELLA OXYTOCA Note: Gram Stain Report Called to,Read Back By and Verified With: ASHLEY@12 :05PM ON 01/09/14 BY DANTS Performed at [facility]    Report Status 01/11/2014 FINAL  Final   Organism ID, Bacteria KLEBSIELLA OXYTOCA  Final      Susceptibility   Klebsiella oxytoca - MIC*    AMPICILLIN >=32 RESISTANT Resistant     AMPICILLIN/SULBACTAM 8 SENSITIVE Sensitive     CEFAZOLIN <=4 SENSITIVE Sensitive     CEFEPIME <=1 SENSITIVE Sensitive     CEFTAZIDIME <=1 SENSITIVE Sensitive     CEFTRIAXONE <=1 SENSITIVE Sensitive     CIPROFLOXACIN <=0.25 SENSITIVE Sensitive     GENTAMICIN <=1 SENSITIVE Sensitive     IMIPENEM <=0.25 SENSITIVE Sensitive     PIP/TAZO <=4 SENSITIVE Sensitive     TOBRAMYCIN <=1 SENSITIVE Sensitive     TRIMETH/SULFA <=20 SENSITIVE Sensitive     * KLEBSIELLA OXYTOCA  MRSA PCR Screening     Status: None   Collection Time: 01/09/14  6:36 PM  Result Value Ref Range Status   MRSA by PCR NEGATIVE NEGATIVE Final    Comment:        The GeneXpert MRSA Assay (FDA approved for NASAL specimens only), is one component of a comprehensive MRSA colonization surveillance program. It is not intended to diagnose MRSA infection nor to guide or monitor treatment for MRSA infections.   Clostridium Difficile by PCR     Status: None   Collection Time: 01/10/14  4:34 PM  Result Value Ref Range Status   C difficile by pcr NEGATIVE NEGATIVE Final     Labs: Basic Metabolic Panel:  Recent Labs Lab 01/09/14 0517 01/10/14 0838 01/11/14 0351 01/13/14 0500 01/14/14 0453  NA 143 138 140 139 143  K 4.2 3.8 3.6* 4.5 4.1  CL 109 103 108 107 107  CO2 18* 20 20 19 21   GLUCOSE 87 105* 87 91 74  BUN 42* 39* 31* 23 18  CREATININE 1.92* 1.93* 1.52* 1.63* 1.39*  CALCIUM 8.4 8.2* 8.5 8.6 8.6   Liver Function Tests:  Recent Labs Lab 01/09/14 0517 01/10/14 0838 01/11/14 0351 01/12/14 0343 01/13/14 0500  AST 215* 120* 63*  33 34  ALT 229* 151* 104* 70* 53*  ALKPHOS 161* 147* 130* 167* 205*  BILITOT 1.8* 2.1* 1.3*  1.8* 1.3*  PROT 6.0 5.8* 5.4* 5.6* 6.1  ALBUMIN 2.5* 2.3* 2.0* 1.9* 2.2*    Recent Labs Lab 01/07/14 2252 01/08/14 0700 01/09/14 0517 01/10/14 0324 01/12/14 0343  LIPASE 2320* 1099* 50 92*  --   AMYLASE  --   --   --   --  43   CBC:  Recent Labs Lab 01/07/14 2252  01/09/14 0517 01/09/14 1528 01/10/14 0838 01/11/14 0351 01/13/14 0500  WBC 12.7*  < > 21.3* 8.7 22.4* 19.6* 10.4  NEUTROABS 10.4*  --   --   --   --   --   --   HGB 10.4*  < > 8.6* 9.7* 8.0* 7.4* 9.2*  HCT 32.7*  < > 26.7* 30.2* 24.1* 22.7* 27.5*  MCV 92.6  < > 92.4 92.1 89.9 86.0 84.1  PLT 218  < > 113* 111* 76* 77* 176  < > = values in this interval not displayed.  BNP (last 3 results)  Recent Labs  01/09/14 0517  PROBNP 5933.0*   CBG:  Recent Labs Lab 01/11/14 0942  GLUCAP 83   Signed:  Barton Dubois  [facility] 01/14/2014, 3:15 PM

## 2014-01-14 NOTE — Progress Notes (Addendum)
pts sats on 2l/[place]-98%. O2 off with sats down to 96% on room air. Pt ambulated in hallway with walker on room air with sats down to 91-92%. Up in chair now

## 2014-01-15 LAB — TYPE AND SCREEN
ABO/RH(D): AB POS
ANTIBODY SCREEN: NEGATIVE
UNIT DIVISION: 0
Unit division: 0

## 2014-01-15 NOTE — Care Management Note (Signed)
    Page 1 of 2   01/15/2014     3:02:17 PM CARE MANAGEMENT NOTE 01/15/2014  Patient:  Katelyn [name], Katelyn [name]   Account Number:  [id number]  Date Initiated:  01/09/2014  Documentation initiated by:  [facility]  Subjective/Objective Assessment:   pancreatitis     Action/Plan:   discharge planning   Anticipated DC Date:  01/14/2014   Anticipated DC Plan:        [place]  CM consult      [facility] Choice  HOME HEALTH   Choice offered to / List presented to:  C-1 Patient        [place] arranged  HH-1 RN  [place].   Status of service:  Completed, signed off Medicare Important Message given?  YES (If response is "NO", the following Medicare IM given date fields will be blank) Date Medicare IM given:  01/10/2014 Medicare IM given by:  MAYO,HENRIETTA Date Additional Medicare IM given:   Additional Medicare IM given by:    Discharge Disposition:  [place]  Per UR Regulation:  Reviewed for med. necessity/level of care/duration of stay  If discussed at [place] of Stay Meetings, dates discussed:    Comments:  01/15/14 07:300 CM notes pt sent home yesterday with [place] orders (no consult) and Peggy the RN did not call CM to arrange [facility].  Peggy states the pt didn't need anything. CM called pt and pt does indeed need hHRN and PT.  CM called [facility] rep, Stephanie with referral.  No other CM needs were communicated.  Mariane Masters, BSN, [place] [phone].  01/10/14 8185 Babette Relic RN MSN BSN CCM Transferred to [place], systolic pressures 63J-49F.  01/09/2014 1500 UR complete. Jonnie Finner RN CCM Case Mgmt

## 2014-01-23 ENCOUNTER — Ambulatory Visit (INDEPENDENT_AMBULATORY_CARE_PROVIDER_SITE_OTHER): Payer: Medicare Other | Admitting: Physician Assistant

## 2014-01-23 ENCOUNTER — Encounter: Payer: Self-pay | Admitting: Physician Assistant

## 2014-01-23 ENCOUNTER — Other Ambulatory Visit (INDEPENDENT_AMBULATORY_CARE_PROVIDER_SITE_OTHER): Payer: Medicare Other

## 2014-01-23 VITALS — BP 100/60 | HR 72 | Ht <= 58 in | Wt 106.8 lb

## 2014-01-23 DIAGNOSIS — K859 Acute pancreatitis, unspecified: Secondary | ICD-10-CM

## 2014-01-23 DIAGNOSIS — K851 Biliary acute pancreatitis without necrosis or infection: Secondary | ICD-10-CM

## 2014-01-23 DIAGNOSIS — K858 Other acute pancreatitis without necrosis or infection: Secondary | ICD-10-CM

## 2014-01-23 LAB — CBC
HCT: 30.6 % — ABNORMAL LOW (ref 36.0–46.0)
Hemoglobin: 9.9 g/dL — ABNORMAL LOW (ref 12.0–15.0)
MCHC: 32.4 g/dL (ref 30.0–36.0)
MCV: 88.9 fl (ref 78.0–100.0)
Platelets: 320 10*3/uL (ref 150.0–400.0)
RBC: 3.45 Mil/uL — ABNORMAL LOW (ref 3.87–5.11)
RDW: 14.1 % (ref 11.5–15.5)
WBC: 10 10*3/uL (ref 4.0–10.5)

## 2014-01-23 LAB — COMPREHENSIVE METABOLIC PANEL
ALBUMIN: 3.2 g/dL — AB (ref 3.5–5.2)
ALT: 15 U/L (ref 0–35)
AST: 31 U/L (ref 0–37)
Alkaline Phosphatase: 129 U/L — ABNORMAL HIGH (ref 39–117)
BUN: 23 mg/dL (ref 6–23)
CALCIUM: 9.1 mg/dL (ref 8.4–10.5)
CHLORIDE: 104 meq/L (ref 96–112)
CO2: 26 mEq/L (ref 19–32)
Creatinine, Ser: 1.6 mg/dL — ABNORMAL HIGH (ref 0.4–1.2)
GFR: 42.92 mL/min — AB (ref >60.00)
GLUCOSE: 88 mg/dL (ref 70–99)
POTASSIUM: 4.3 meq/L (ref 3.5–5.1)
Sodium: 140 mEq/L (ref 135–145)
TOTAL PROTEIN: 7.9 g/dL (ref 6.0–8.3)
Total Bilirubin: 1 mg/dL (ref 0.2–1.2)

## 2014-01-23 LAB — HEPATIC FUNCTION PANEL
ALT: 15 U/L (ref 0–35)
AST: 31 U/L (ref 0–37)
Albumin: 3.2 g/dL — ABNORMAL LOW (ref 3.5–5.2)
Alkaline Phosphatase: 129 U/L — ABNORMAL HIGH (ref 39–117)
BILIRUBIN TOTAL: 1 mg/dL (ref 0.2–1.2)
Bilirubin, Direct: 0.2 mg/dL (ref 0.0–0.3)
Total Protein: 7.9 g/dL (ref 6.0–8.3)

## 2014-01-23 LAB — LIPASE: Lipase: 32 U/L (ref 11.0–59.0)

## 2014-01-23 LAB — AMYLASE: Amylase: 35 U/L (ref 27–131)

## 2014-01-23 NOTE — Progress Notes (Signed)
Agree w/ Ms. Hvozdovic's note and mangement.  

## 2014-01-23 NOTE — Patient Instructions (Signed)
Your physician has requested that you go to the basement for the following lab work before leaving today: Hepatic Panel, Amylase, Lipase  You will be scheduled for endoscopic ultrasound with Dr Owens Loffler. Once he has reviewed your chart, we will get back with you as to time and date of your procedure.  CC:Dr D.Jacobs

## 2014-01-23 NOTE — Progress Notes (Signed)
Patient ID: Katelyn [name], female   DOB: April 30, 1950, 63 y.o.   MRN: [id number]     History of Present Illness: Katelyn [name] is a 63 year old female with a past medical history of hypertension and hyperlipidemia who was admitted to the hospital on 01/07/2014 with gallstone pancreatitis. Her symptoms of pain, nausea, and vomiting started suddenly. Workup in the emergency room revealed elevated liver enzymes and pancreatitis ultrasound had no overt evidence of gallstones but the common bile duct was 6 mm there was no evidence for cholecystitis. She had an MRCP on 01/08/2014 that showed borderline gallbladder distention and gallbladder wall thickening without gallstone or specific evidence of acute cholecystitis. No evidence of biliary ductal dilatation or convincing evidence of choledocholithiasis. Findings of chronic pancreatitis. Trace perihepatic ascites. She then had a CT of the abdomen and pelvis that showed possible ampullary versus pancreatic head mass containing a single calcification. This calcification could potentially reside in the distal common bile duct. The apparent mass measures 2 cm in size. Gallbladder wall thickening but no pericholecystic inflammatory change. Gallbladder wall thickening is most likely reactive. Acute cholecystitis is felt less likely but not excluded. No CT evidence of pancreatitis. Trace ascites of unclear etiology. She then had a nuclear medicine hepatobiliary scan which showed nonvisualization of the common bile duct. No passage of bile into the small bowel. Findings are consistent with common bile duct obstruction versus hepatitis. She underwent an ERCP by Dr. Carlean Purl on 01/11/2014. She was noted to have edematous folds around papilla that made it hard to identify the orifice. Biliary tree showed mildly dilated CBD/CHD and no stones the papilla and folds surrounding were edematous and distorted. A sphincterotomy was not performed and a 5 cm 10 [place] biliary stent was placed  a pancreatogram was unable to be obtained. While in the hospital the patient also had septic shock due to Klebsiella oxytoca bacteremia. She has chronic kidney disease stage III and chronic diastolic heart failure. She presents for follow-up today she states she feels well. She has no abdominal pain, no nausea or vomiting. Her appetite is good. She has had no fever or chills.   Past Medical History  Diagnosis Date  . Hypertension   . Hypercholesteremia   . Shortness of breath   . Blood transfusion   . Stroke     Past Surgical History  Procedure Laterality Date  . Abdominal hysterectomy    . Tee without cardioversion  03/06/2011    Procedure: TRANSESOPHAGEAL ECHOCARDIOGRAM (TEE);  Surgeon: Jolaine Artist, MD;  Location: [facility] ENDOSCOPY;  Service: Cardiovascular;  Laterality: N/A;  . Endoscopic retrograde cholangiopancreatography (ercp) with propofol N/A 01/11/2014    Procedure: ENDOSCOPIC RETROGRADE CHOLANGIOPANCREATOGRAPHY (ERCP) WITH PROPOFOL;  Surgeon: Gatha Mayer, MD;  Location: [facility];  Service: Endoscopy;  Laterality: N/A;  . Ercp     Family History  Problem Relation Age of Onset  . Anesthesia problems Neg Hx   . Hypotension Neg Hx   . Malignant hyperthermia Neg Hx   . Pseudochol deficiency Neg Hx   . Cancer Maternal Aunt   . Diabetes Mellitus I Father   . Diabetes Mellitus I Sister    History  Substance Use Topics  . Smoking status: Former Smoker -- 0.25 packs/day    Types: Cigarettes  . Smokeless tobacco: Never Used  . Alcohol Use: No   Current Outpatient Prescriptions  Medication Sig Dispense Refill  . aspirin EC 81 MG tablet Take 81 mg by mouth daily.    . carvedilol (COREG) 12.5  MG tablet Take 1 tablet (12.5 mg total) by mouth 2 (two) times daily with a meal. 60 tablet 1  . ciprofloxacin (CIPRO) 500 MG tablet Take 1 tablet (500 mg total) by mouth 2 (two) times daily. 14 tablet 0  . furosemide (LASIX) 20 MG tablet Take 1 tablet (20 mg total) by mouth  daily. 30 tablet 1  . Multiple Vitamin (MULITIVITAMIN WITH MINERALS) TABS Take 1 tablet by mouth daily.    . potassium chloride 20 MEQ TBCR Take 20 mEq by mouth daily. 30 tablet 1   No current facility-administered medications for this visit.   No Known Allergies    Review of Systems: Gen: Denies any fever, chills, sweats, anorexia, fatigue, weakness, malaise, weight loss, and sleep disorder CV: Denies chest pain, angina, palpitations, syncope, orthopnea, PND, peripheral edema, and claudication. Resp: Denies dyspnea at rest, dyspnea with exercise, cough, sputum, wheezing, coughing up blood, and pleurisy. GI: Denies vomiting blood, jaundice, and fecal incontinence.   Denies dysphagia or odynophagia. GU : Denies urinary burning, blood in urine, urinary frequency, urinary hesitancy, nocturnal urination, and urinary incontinence. MS: Denies joint pain, limitation of movement, and swelling, stiffness, low back pain, extremity pain. Denies muscle weakness, cramps, atrophy.  Derm: Denies rash, itching, dry skin, hives, moles, warts, or unhealing ulcers.  Psych: Denies depression, anxiety, memory loss, suicidal ideation, hallucinations, paranoia, and confusion. Heme: Denies bruising, bleeding, and enlarged lymph nodes. Neuro:  Denies any headaches, dizziness, paresthesia Endo:  Denies any problems with DM, thyroid, adrena   Studies:   Ct Abdomen Pelvis Wo Contrast  01/09/2014   CLINICAL DATA:  Upper abd pain. N/v, ?acute on chronic pancreatitis, recent MRCP GB wall thickening, no iv contrast due to renal insufficiency  EXAM: CT ABDOMEN AND PELVIS WITHOUT CONTRAST  TECHNIQUE: Multidetector CT imaging of the abdomen and pelvis was performed following the standard protocol without IV contrast.  COMPARISON:  MRCP, 01/08/2014  FINDINGS: There is a calcification that lies along the medial wall of the second portion the duodenum adjacent to the pancreatic head. Is surrounded by low-attenuation soft  tissue that measures 20 mm x 17 mm x 18 mm in size. This may be a mass at the ampulla. The stone could reside in the distal duct or be extra ductal. Potentially, this apparent mass could be protruding medially from the pancreatic head.  No other evidence of a pancreatic mass. No CT evidence of pancreatitis. There is irregular dilation of the pancreatic duct better depicted on the recent MRCP. This is likely due to chronic pancreatitis.  Gallbladder is moderately distended. There is mild wall thickening. No radiopaque gallstone is seen. The fat adjacent to the gallbladder shows no significant inflammatory stranding.  Common bile duct was better defined on the MRCP. Common bile duct is prominent, but within the normal range for this patient's age.  Subtle low-density lesion is seen in the posterior segment right lobe of the liver. This was a T2 hyperintense lesion on CT, likely a cyst. No other liver abnormalities. Spleen is unremarkable. There is adrenal gland thickening, greater on the left, likely hyperplasia. No discrete adrenal mass. Kidneys are unremarkable. Bladder is unremarkable.  Uterus is surgically absent.  No pelvic masses.  No adenopathy. There is a trace amount of ascites which lies adjacent to the liver and in the posterior pelvic recess.  There are numerous sigmoid colon diverticula. No diverticulitis. No bowel wall thickening or bowel inflammatory change. No evidence of obstruction.  Dense aortoiliac vascular calcifications are noted.  No aneurysm.  There are mild degenerative changes of the visualized spine. Bones are demineralized. No osteoblastic or osteolytic lesions.  Small effusions with associated lung base atelectasis. Heart is normal in size. Small hiatal hernia.  IMPRESSION: 1. Possible ampullary versus pancreatic head mass containing a single calcification. This calcification could potentially reside in the distal common bile duct. The apparent mass measures 2 cm in size. Recommend  followup endoscopy/ERCP. 2. Gallbladder wall thickening, but no pericholecystic inflammatory change. Gallbladder wall thickening is most likely reactive. Acute cholecystitis is felt less likely but not excluded. 3. No CT evidence of pancreatitis. 4. Trace ascites of unclear etiology. 5. Small pleural effusions with associated compressive atelectasis. 6. There are chronic findings including atherosclerotic change along the abdominal aorta and its branch vessels as well as extensive sigmoid colon diverticulosis. Status post hysterectomy.   Electronically Signed   By: Lajean Manes M.D.   On: 01/09/2014 21:19   Dg Chest 2 View  01/13/2014   CLINICAL DATA:  Shortness of breath, congestion.  EXAM: CHEST  2 VIEW  COMPARISON:  01/09/2014  FINDINGS: Small bilateral pleural effusions. Bibasilar atelectasis. Heart is normal size. Mediastinal contours within normal limits. No acute bony abnormality.  IMPRESSION: Small bilateral pleural effusions with bibasilar atelectasis.   Electronically Signed   By: Rolm Baptise M.D.   On: 01/13/2014 21:31   Nm Hepatobiliary Including Gb  01/14/2014   CLINICAL DATA:  Elevated liver enzymes, pancreatitis  EXAM: NUCLEAR MEDICINE HEPATOBILIARY IMAGING  TECHNIQUE: Sequential images of the abdomen were obtained out to 60 minutes following intravenous administration of radiopharmaceutical.  RADIOPHARMACEUTICALS:  5.0 [employer] IO-97D Choletec  COMPARISON:  MRCP 01/09/1999 15  FINDINGS: Exam was presented for interpretation on 01/14/2014. Exam was completed on 01/09/2014.  There is prompt clearance of radiotracer from the blood pool and homogeneous uptake within the liver. No radiotracer bile within the small bowel or common bile duct at 60 min. This suggests common bile duct obstruction. Gallbladder filling cannot be assessed on the background of biliary obstruction. 2 mg of morphine was administered with no visualization of the common duct or gallbladder.  IMPRESSION: Nonvisualization  of the common bile duct. No passage of bile into the small bowel. Findings are consistent with common bile duct obstruction versus hepatitis. Gallbladder filling cannot be assessed on the background of potential biliary obstruction. Cannot exclude cholecystitis.   Electronically Signed   By: Suzy Bouchard M.D.   On: 01/14/2014 20:43   Mr 3d Recon At Scanner  01/08/2014   CLINICAL DATA:  Pancreatitis due to common bile duct stone K85.9 (ICD-10-CM). PMH of HTN and hyperlipidemia who is admitted for gallstone pancreatitis. Her symptoms started rather suddenly. She is a soft spoken person and drowsy from phenergan at the time of the interview. Further work up in the hospital revealed elevated liver enzymes and pancreatitis. No overt evidence of gallstones, but the CBD is 6 mm. No evidence of cholecystitis  EXAM: MRI ABDOMEN WITHOUT AND WITH CONTRAST (INCLUDING MRCP)  TECHNIQUE: Multiplanar multisequence MR imaging of the abdomen was performed both before and after the administration of intravenous contrast. Heavily T2-weighted images of the biliary and pancreatic ducts were obtained, and three-dimensional MRCP images were rendered by post processing.  CONTRAST:  71mL MULTIHANCE GADOBENATE DIMEGLUMINE 529 MG/ML IV SOLN  COMPARISON:  Right upper quadrant ultrasound of earlier today.  FINDINGS: The majority of the exam is moderate to markedly motion degraded.  Lower chest: Cardiomegaly without pericardial or pleural effusion.  Hepatobiliary: Probable cyst  in the posterior right lobe of the liver at 7 mm. Intrahepatic ducts upper normal for age. There is mild gallbladder wall thickening at 6 mm on image 22 series 8. Borderline gallbladder distension at approximately 8 cm. Favor artifactual T2 hypointensity within the dependent gallbladder and adjacent proximal common duct. Example image 18 of series 5. Not confirmed on other pulse sequences, including repeat series 7. The common duct is normal for age measuring  maximally 7 mm. Other than the above presumed artifact, no evidence of choledocholithiasis.  Pancreas: Pancreatic atrophy with moderate diffuse duct dilatation and irregularity. No well-defined pancreatic mass or significant peripancreatic inflammation.  Spleen: Normal  Adrenals/Urinary Tract: Mild left adrenal thickening. Normal right adrenal gland. Mild renal cortical thinning bilaterally. No hydronephrosis.  Stomach/Bowel: Small hiatal hernia. Grossly normal large and small bowel loops.  Vascular/Lymphatic: Advanced aortic and branch vessel atherosclerosis. No abdominal adenopathy.  Other:  Trace perihepatic ascites.  Musculoskeletal: No focal osseous abnormality.  IMPRESSION: 1. Moderate to severely motion degraded exam. 2. Borderline gallbladder distention and gallbladder wall thickening, without gallstone or specific evidence of acute cholecystitis. 3. No evidence of biliary ductal dilatation or convincing evidence of choledocholithiasis. 4. Findings of chronic pancreatitis. 5. Trace perihepatic ascites.   Electronically Signed   By: Abigail Miyamoto M.D.   On: 01/08/2014 17:36   US Abdomen Limited  01/08/2014   CLINICAL DATA:  Nnausea, vomiting, pancreatitis  EXAM: US ABDOMEN LIMITED - RIGHT UPPER QUADRANT  COMPARISON:  None.  FINDINGS: Gallbladder:  No gallstones or wall thickening visualized. No sonographic Murphy sign noted.  Common bile duct:  Diameter: 6.8 mm  Liver:  No focal lesion identified. Within normal limits in parenchymal echogenicity.  IMPRESSION: Normal right upper quadrant ultrasound.   Electronically Signed   By: Kathreen Devoid   On: 01/08/2014 03:35   Dg Chest Port 1 View  01/09/2014   CLINICAL DATA:  Hypoxia  EXAM: PORTABLE CHEST - 1 VIEW  COMPARISON:  03/03/2011  FINDINGS: The heart size and mediastinal contours are within normal limits. Both lungs are clear. The visualized skeletal structures are unremarkable.  IMPRESSION: No active disease.   Electronically Signed   By: Inez Catalina  M.D.   On: 01/09/2014 00:25   Dg Ercp  01/11/2014   CLINICAL DATA:  Possible ampullary mass with possible common bile duct stone  EXAM: ERCP  TECHNIQUE: Multiple spot images obtained with the fluoroscopic device and submitted for interpretation post-procedure.  COMPARISON:  01/09/2014, 01/08/2014  FINDINGS: 147 seconds of fluoroscopy was utilized. Five spot films were obtained. The initial images show the endoscope in place. Injection was then performed common bile duct with suggestion of filling defects within the common bile duct and cystic duct. A plastic biliary stent was then placed. Flow of contrast material into the duodenum is noted.  IMPRESSION: Possible filling defects within the common bile duct and cystic duct as described. Correlation with operative findings is recommended.  These images were submitted for radiologic interpretation only. Please see the procedural report for the amount of contrast and the fluoroscopy time utilized.   Electronically Signed   By: Inez Catalina M.D.   On: 01/11/2014 18:07   Mr Abd W/wo Cm/mrcp  01/08/2014   CLINICAL DATA:  Pancreatitis due to common bile duct stone K85.9 (ICD-10-CM). PMH of HTN and hyperlipidemia who is admitted for gallstone pancreatitis. Her symptoms started rather suddenly. She is a soft spoken person and drowsy from phenergan at the time of the interview. Further work up  in the hospital revealed elevated liver enzymes and pancreatitis. No overt evidence of gallstones, but the CBD is 6 mm. No evidence of cholecystitis  EXAM: MRI ABDOMEN WITHOUT AND WITH CONTRAST (INCLUDING MRCP)  TECHNIQUE: Multiplanar multisequence MR imaging of the abdomen was performed both before and after the administration of intravenous contrast. Heavily T2-weighted images of the biliary and pancreatic ducts were obtained, and three-dimensional MRCP images were rendered by post processing.  CONTRAST:  66mL MULTIHANCE GADOBENATE DIMEGLUMINE 529 MG/ML IV SOLN  COMPARISON:   Right upper quadrant ultrasound of earlier today.  FINDINGS: The majority of the exam is moderate to markedly motion degraded.  Lower chest: Cardiomegaly without pericardial or pleural effusion.  Hepatobiliary: Probable cyst in the posterior right lobe of the liver at 7 mm. Intrahepatic ducts upper normal for age. There is mild gallbladder wall thickening at 6 mm on image 22 series 8. Borderline gallbladder distension at approximately 8 cm. Favor artifactual T2 hypointensity within the dependent gallbladder and adjacent proximal common duct. Example image 18 of series 5. Not confirmed on other pulse sequences, including repeat series 7. The common duct is normal for age measuring maximally 7 mm. Other than the above presumed artifact, no evidence of choledocholithiasis.  Pancreas: Pancreatic atrophy with moderate diffuse duct dilatation and irregularity. No well-defined pancreatic mass or significant peripancreatic inflammation.  Spleen: Normal  Adrenals/Urinary Tract: Mild left adrenal thickening. Normal right adrenal gland. Mild renal cortical thinning bilaterally. No hydronephrosis.  Stomach/Bowel: Small hiatal hernia. Grossly normal large and small bowel loops.  Vascular/Lymphatic: Advanced aortic and branch vessel atherosclerosis. No abdominal adenopathy.  Other:  Trace perihepatic ascites.  Musculoskeletal: No focal osseous abnormality.  IMPRESSION: 1. Moderate to severely motion degraded exam. 2. Borderline gallbladder distention and gallbladder wall thickening, without gallstone or specific evidence of acute cholecystitis. 3. No evidence of biliary ductal dilatation or convincing evidence of choledocholithiasis. 4. Findings of chronic pancreatitis. 5. Trace perihepatic ascites.   Electronically Signed   By: Abigail Miyamoto M.D.   On: 01/08/2014 17:36     Physical Exam: General: Pleasant, well developed , pleasant female in no acute distress Head: Normocephalic and atraumatic Eyes:  sclerae anicteric,  conjunctiva pink  Ears: Normal auditory acuity Lungs: Clear throughout to auscultation Heart: Regular rate and rhythm Abdomen: Soft, non distended, non-tender. No masses, no hepatomegaly. Normal bowel sounds Musculoskeletal: Symmetrical with no gross deformities  Extremities: No edema  Neurological: Alert oriented x 4, grossly nonfocal Psychological:  Alert and cooperative. Normal mood and affect  Assessment and Recommendations: 63 year old female admitted with acute pancreatitis of unspecified type use lipase normalized after ERCP and LFTs were trending down. No masses or obstructive stones were seen on ERCP on November 18. The patient has a biliary stent. LFTs, amylase, lipase will be obtained today. The patient has been reviewed with Dr. Carlean Purl and Dr. Ardis Hughs and will be scheduled for EUS with biliary stent removal after the holidays.   Katelyn [name], Vita Barley PA-C 01/23/2014,

## 2014-01-24 ENCOUNTER — Telehealth: Payer: Self-pay

## 2014-01-24 NOTE — Telephone Encounter (Signed)
Katelyn [name], I agree with the above note, plan.  [name], She needs upper EUS, radial +/- linear and ERCP in early to mid January for recent pancreatitis, bile duct stent removal. +MAC or general. thanks

## 2014-01-24 NOTE — Progress Notes (Signed)
Cecille Rubin, I agree with the above note, plan.  Patty, She needs upper EUS, radial +/- linear and ERCP in early to mid January for recent pancreatitis, bile duct stent removal.  +MAC or general.  thanks

## 2014-01-24 NOTE — Telephone Encounter (Signed)
-----   Message from Milus Banister, MD sent at 01/24/2014  7:19 AM EST -----   ----- Message -----    From: Vita Barley Hvozdovic, PA-C    Sent: 01/23/2014   3:50 PM      To: Milus Banister, MD

## 2014-01-25 ENCOUNTER — Other Ambulatory Visit: Payer: Self-pay

## 2014-01-25 ENCOUNTER — Encounter: Payer: Self-pay | Admitting: *Deleted

## 2014-01-25 DIAGNOSIS — K858 Other acute pancreatitis without necrosis or infection: Secondary | ICD-10-CM

## 2014-01-25 NOTE — Telephone Encounter (Signed)
Left message on machine to call back EUS ERCP 03/02/14 instructions mailed to the home

## 2014-01-31 NOTE — Telephone Encounter (Signed)
EUS scheduled, pt instructed and medications reviewed.  Patient instructions mailed to home.  Patient to call with any questions or concerns.  

## 2014-02-15 ENCOUNTER — Encounter (HOSPITAL_COMMUNITY): Payer: Self-pay | Admitting: *Deleted

## 2014-03-01 NOTE — Anesthesia Preprocedure Evaluation (Addendum)
Anesthesia Evaluation  Patient identified by MRN, date of birth, ID band Patient awake    Reviewed: Allergy & Precautions, NPO status , Patient's Chart, lab work & pertinent test results  History of Anesthesia Complications Negative for: history of anesthetic complications  Airway Mallampati: II  TM Distance: >3 FB Neck ROM: Full    Dental no notable dental hx. (+) Dental Advisory Given   Pulmonary former smoker,  breath sounds clear to auscultation  Pulmonary exam normal       Cardiovascular hypertension, Pt. on medications and Pt. on home beta blockers + Valvular Problems/Murmurs AS Rhythm:Regular Rate:Normal     Neuro/Psych CVA negative psych ROS   GI/Hepatic negative GI ROS, Neg liver ROS,   Endo/Other  negative endocrine ROS  Renal/GU CRFRenal disease  negative genitourinary   Musculoskeletal negative musculoskeletal ROS (+)   Abdominal   Peds negative pediatric ROS (+)  Hematology negative hematology ROS (+) anemia ,   Anesthesia Other Findings   Reproductive/Obstetrics negative OB ROS                            Anesthesia Physical Anesthesia Plan  ASA: II  Anesthesia Plan: General   Post-op Pain Management:    Induction: Intravenous  Airway Management Planned: Oral ETT  Additional Equipment:   Intra-op Plan:   Post-operative Plan: Extubation in OR  Informed Consent: I have reviewed the patients History and Physical, chart, labs and discussed the procedure including the risks, benefits and alternatives for the proposed anesthesia with the patient or authorized representative who has indicated his/her understanding and acceptance.   Dental advisory given  Plan Discussed with: CRNA  Anesthesia Plan Comments: (Patient significantly hypertensive with systolics >202 and diastolics >542 despite treatment with labetalol. Asymptomatic but decision made with Dr. Ardis Hughs to  cancel surgery and he will set up follow up with her primary care physician. )       Anesthesia Quick Evaluation

## 2014-03-02 ENCOUNTER — Ambulatory Visit (HOSPITAL_COMMUNITY)
Admission: RE | Admit: 2014-03-02 | Discharge: 2014-03-02 | Disposition: A | Discharge status: Home / Self Care | Payer: Medicare Other | Source: Ambulatory Visit | Attending: Gastroenterology | Admitting: Gastroenterology

## 2014-03-02 ENCOUNTER — Emergency Department (HOSPITAL_COMMUNITY)
Admission: EM | Admit: 2014-03-02 | Discharge: 2014-03-02 | Disposition: A | Discharge status: Home / Self Care | Payer: Medicare Other | Attending: Emergency Medicine | Admitting: Emergency Medicine

## 2014-03-02 ENCOUNTER — Ambulatory Visit (HOSPITAL_COMMUNITY): Payer: Medicare Other | Admitting: Anesthesiology

## 2014-03-02 ENCOUNTER — Encounter (HOSPITAL_COMMUNITY): Payer: Self-pay

## 2014-03-02 ENCOUNTER — Encounter (HOSPITAL_COMMUNITY): Payer: Self-pay | Admitting: Gastroenterology

## 2014-03-02 ENCOUNTER — Other Ambulatory Visit: Payer: Self-pay

## 2014-03-02 ENCOUNTER — Telehealth: Payer: Self-pay

## 2014-03-02 ENCOUNTER — Emergency Department (HOSPITAL_COMMUNITY): Payer: Medicare Other

## 2014-03-02 ENCOUNTER — Encounter (HOSPITAL_COMMUNITY)
Admission: RE | Disposition: A | Discharge status: Home / Self Care | Payer: Self-pay | Source: Ambulatory Visit | Attending: Gastroenterology

## 2014-03-02 DIAGNOSIS — I82401 Acute embolism and thrombosis of unspecified deep veins of right lower extremity: Secondary | ICD-10-CM | POA: Diagnosis not present

## 2014-03-02 DIAGNOSIS — Z87891 Personal history of nicotine dependence: Secondary | ICD-10-CM | POA: Diagnosis not present

## 2014-03-02 DIAGNOSIS — R059 Cough, unspecified: Secondary | ICD-10-CM

## 2014-03-02 DIAGNOSIS — I10 Essential (primary) hypertension: Secondary | ICD-10-CM | POA: Diagnosis not present

## 2014-03-02 DIAGNOSIS — Z4689 Encounter for fitting and adjustment of other specified devices: Secondary | ICD-10-CM

## 2014-03-02 DIAGNOSIS — Z8639 Personal history of other endocrine, nutritional and metabolic disease: Secondary | ICD-10-CM | POA: Diagnosis not present

## 2014-03-02 DIAGNOSIS — Z8673 Personal history of transient ischemic attack (TIA), and cerebral infarction without residual deficits: Secondary | ICD-10-CM | POA: Insufficient documentation

## 2014-03-02 DIAGNOSIS — M7989 Other specified soft tissue disorders: Secondary | ICD-10-CM

## 2014-03-02 DIAGNOSIS — Z7982 Long term (current) use of aspirin: Secondary | ICD-10-CM | POA: Insufficient documentation

## 2014-03-02 DIAGNOSIS — K858 Other acute pancreatitis without necrosis or infection: Secondary | ICD-10-CM

## 2014-03-02 DIAGNOSIS — E780 Pure hypercholesterolemia: Secondary | ICD-10-CM | POA: Insufficient documentation

## 2014-03-02 DIAGNOSIS — Z5309 Procedure and treatment not carried out because of other contraindication: Secondary | ICD-10-CM | POA: Diagnosis not present

## 2014-03-02 DIAGNOSIS — Z862 Personal history of diseases of the blood and blood-forming organs and certain disorders involving the immune mechanism: Secondary | ICD-10-CM | POA: Insufficient documentation

## 2014-03-02 DIAGNOSIS — R609 Edema, unspecified: Secondary | ICD-10-CM

## 2014-03-02 DIAGNOSIS — K859 Acute pancreatitis, unspecified: Secondary | ICD-10-CM | POA: Diagnosis not present

## 2014-03-02 DIAGNOSIS — Z79899 Other long term (current) drug therapy: Secondary | ICD-10-CM | POA: Insufficient documentation

## 2014-03-02 DIAGNOSIS — R05 Cough: Secondary | ICD-10-CM

## 2014-03-02 DIAGNOSIS — I159 Secondary hypertension, unspecified: Secondary | ICD-10-CM | POA: Insufficient documentation

## 2014-03-02 DIAGNOSIS — Z86718 Personal history of other venous thrombosis and embolism: Secondary | ICD-10-CM

## 2014-03-02 HISTORY — DX: Anemia, unspecified: D64.9

## 2014-03-02 LAB — I-STAT CHEM 8, ED
BUN: 16 mg/dL (ref 6–23)
CHLORIDE: 107 meq/L (ref 96–112)
Calcium, Ion: 1.24 mmol/L (ref 1.13–1.30)
Creatinine, Ser: 0.8 mg/dL (ref 0.50–1.10)
Glucose, Bld: 98 mg/dL (ref 70–99)
HEMATOCRIT: 32 % — AB (ref 36.0–46.0)
Hemoglobin: 10.9 g/dL — ABNORMAL LOW (ref 12.0–15.0)
Potassium: 3.4 mmol/L — ABNORMAL LOW (ref 3.5–5.1)
SODIUM: 144 mmol/L (ref 135–145)
TCO2: 22 mmol/L (ref 0–100)

## 2014-03-02 LAB — BRAIN NATRIURETIC PEPTIDE: B NATRIURETIC PEPTIDE 5: 126.8 pg/mL — AB (ref 0.0–100.0)

## 2014-03-02 SURGERY — Surgical Case

## 2014-03-02 MED ORDER — ONDANSETRON HCL 4 MG/2ML IJ SOLN: 4.0000 mg | Freq: Once | INTRAMUSCULAR | Status: DC | PRN

## 2014-03-02 MED ORDER — ONDANSETRON HCL 4 MG/2ML IJ SOLN
INTRAMUSCULAR | Status: AC
  Filled 2014-03-02: qty 2

## 2014-03-02 MED ORDER — RIVAROXABAN (XARELTO) EDUCATION KIT FOR DVT/PE PATIENTS
PACK | Freq: Once | Status: DC
  Filled 2014-03-02: qty 1

## 2014-03-02 MED ORDER — PROPOFOL 10 MG/ML IV BOLUS
INTRAVENOUS | Status: AC
  Filled 2014-03-02: qty 20

## 2014-03-02 MED ORDER — LABETALOL HCL 5 MG/ML IV SOLN: 5.0000 mg | Freq: Once | INTRAVENOUS | Status: DC

## 2014-03-02 MED ORDER — XARELTO VTE STARTER PACK 15 & 20 MG PO TBPK: 15.0000 mg | ORAL_TABLET | ORAL | Status: DC

## 2014-03-02 MED ORDER — FENTANYL CITRATE 0.05 MG/ML IJ SOLN: 25.0000 ug | INTRAMUSCULAR | Status: DC | PRN

## 2014-03-02 MED ORDER — LABETALOL HCL 5 MG/ML IV SOLN
INTRAVENOUS | Status: DC | PRN
  Administered 2014-03-02: 5 mg via INTRAVENOUS

## 2014-03-02 MED ORDER — LIDOCAINE HCL (CARDIAC) 20 MG/ML IV SOLN
INTRAVENOUS | Status: AC
  Filled 2014-03-02: qty 5

## 2014-03-02 MED ORDER — DEXAMETHASONE SODIUM PHOSPHATE 10 MG/ML IJ SOLN
INTRAMUSCULAR | Status: AC
  Filled 2014-03-02: qty 1

## 2014-03-02 MED ORDER — LACTATED RINGERS IV SOLN: INTRAVENOUS | Status: DC

## 2014-03-02 MED ORDER — SODIUM CHLORIDE 0.9 % IV SOLN: INTRAVENOUS | Status: DC

## 2014-03-02 MED ORDER — FENTANYL CITRATE 0.05 MG/ML IJ SOLN
INTRAMUSCULAR | Status: AC
  Filled 2014-03-02: qty 2

## 2014-03-02 NOTE — Telephone Encounter (Signed)
PCP appt for 03/06/14 with PCP EUS ERCP 04/06/14 730

## 2014-03-02 NOTE — ED Notes (Signed)
Per Endoscopy- Patient was scheduled for removal of biliary stent and was hypertensive in Endoscopy. Systolic 003. Patient did not take her home meds prior to coming for procedure. Patient was given Labetalol 5 mg IV prior to coming to the ED.

## 2014-03-02 NOTE — Progress Notes (Signed)
Procedure canceled per Dr Ardis Hughs due to elevated blood pressure and edema right leg.Patient taken to emergency room for evaluation.

## 2014-03-02 NOTE — Progress Notes (Signed)
error 

## 2014-03-02 NOTE — Telephone Encounter (Signed)
Needs instructions, case, amb ref and orders have been added

## 2014-03-02 NOTE — Progress Notes (Signed)
VASCULAR LAB PRELIMINARY  PRELIMINARY  PRELIMINARY  PRELIMINARY  Bilateral lower extremity venous duplex  completed.    Preliminary report:  Right:  DVT noted in the CFV through the mid to distal FV.  Thrombus is mobile in the CFV.  No evidence of superficial thrombosis.  No Baker's cyst.  Left:  No evidence of DVT, superficial thrombosis, or Baker's cyst.  Merlyn Conley, RVT 03/02/2014, 10:43 AM

## 2014-03-02 NOTE — ED Notes (Signed)
Bed: WA17 Expected date:  Expected time:  Means of arrival:  Comments: Radiology- htn

## 2014-03-02 NOTE — ED Notes (Signed)
Patient transported to X-ray 

## 2014-03-02 NOTE — H&P (Addendum)
HPI: This is a woman with acute pancreatitis 2 months ago, followed by mutliple imaging studies, eventual ERCP for ? Periampullary mass, cbd stone.  Bile duct stent was placed    Past Medical History  Diagnosis Date  . Hypertension   . Hypercholesteremia   . Shortness of breath   . Blood transfusion     11'15 last admission  . Stroke     '13- right side weakness, ambulates with cane  . Anemia     Past Surgical History  Procedure Laterality Date  . Abdominal hysterectomy    . Tee without cardioversion  03/06/2011    Procedure: TRANSESOPHAGEAL ECHOCARDIOGRAM (TEE);  Surgeon: Jolaine Artist, MD;  Location: [facility] ENDOSCOPY;  Service: Cardiovascular;  Laterality: N/A;  . Endoscopic retrograde cholangiopancreatography (ercp) with propofol N/A 01/11/2014    Procedure: ENDOSCOPIC RETROGRADE CHOLANGIOPANCREATOGRAPHY (ERCP) WITH PROPOFOL;  Surgeon: Gatha Mayer, MD;  Location: [facility];  Service: Endoscopy;  Laterality: N/A;  . Ercp      Current Facility-Administered Medications  Medication Dose Route Frequency Provider Last Rate Last Dose  . 0.9 %  sodium chloride infusion   Intravenous Continuous Milus Banister, MD      . fentaNYL (SUBLIMAZE) injection 25-50 mcg  25-50 mcg Intravenous Q5 min PRN Milana Obey, MD      . labetalol (NORMODYNE,TRANDATE) injection 5 mg  5 mg Intravenous Once Milana Obey, MD      . lactated ringers infusion   Intravenous Continuous Milus Banister, MD      . ondansetron [facility]) injection 4 mg  4 mg Intravenous Once PRN Milana Obey, MD       Facility-Administered Medications Ordered in Other Encounters  Medication Dose Route Frequency Provider Last Rate Last Dose  . labetalol (NORMODYNE,TRANDATE) injection    Anesthesia Intra-op Lind Covert, CRNA   5 mg at 03/02/14 8032    Allergies as of 01/25/2014  . (No Known Allergies)    Family History  Problem Relation Age of Onset  . Anesthesia problems Neg Hx   . Hypotension  Neg Hx   . Malignant hyperthermia Neg Hx   . Pseudochol deficiency Neg Hx   . Cancer Maternal Aunt   . Diabetes Mellitus I Father   . Diabetes Mellitus I Sister     History   Social History  . Marital Status: Widowed    Spouse Name: N/A    Number of Children: N/A  . Years of Education: N/A   Occupational History  . Not on file.   Social History Main Topics  . Smoking status: Former Smoker -- 0.25 packs/day    Types: Cigarettes    Quit date: 02/15/2013  . Smokeless tobacco: Never Used  . Alcohol Use: No  . Drug Use: No  . Sexual Activity: Not on file   Other Topics Concern  . Not on file   Social History Narrative      Physical Exam: BP 221/111 mmHg  Pulse 92  Temp(Src) 97.7 F (36.5 C) (Oral)  Resp 12  Ht 4\' 5"  (1.346 m)  Wt 108 lb (48.988 kg)  BMI 27.04 kg/m2 Constitutional: generally well-appearing Psychiatric: alert and oriented x3 Abdomen: soft, nontender, nondistended, no obvious ascites, no peritoneal signs, normal bowel sounds     Assessment and plan: 64 y.o. female with previously placed biliary stent, here for stent removal and further evaluation of pancreas, bile duct  For EUS, +/- ERCP today, stent removal  Addendum, She was given labetolol without improvement in her very elevated BP.  She was also found to have swollen right lower extremity (for 2 days).  Anesthesia and I discussed, not comfortable proceeding with this elective procedure today. She is being transported to ER for ? DVT, hypertensive urgency.

## 2014-03-02 NOTE — ED Notes (Signed)
Pharmacy notified that the patient needed education for the [employer]. Pharmacist to come and review with patient.

## 2014-03-02 NOTE — Discharge Instructions (Signed)
Deep Vein Thrombosis °A deep vein thrombosis (DVT) is a blood clot that develops in the deep, larger veins of the leg, arm, or pelvis. These are more dangerous than clots that might form in veins near the surface of the body. A DVT can lead to serious and even life-threatening complications if the clot breaks off and travels in the bloodstream to the lungs.  °A DVT can damage the valves in your leg veins so that instead of flowing upward, the blood pools in the lower leg. This is called post-thrombotic syndrome, and it can result in pain, swelling, discoloration, and sores on the leg. °CAUSES °Usually, several things contribute to the formation of blood clots. Contributing factors include: °· The flow of blood slows down. °· The inside of the vein is damaged in some way. °· You have a condition that makes blood clot more easily. °RISK FACTORS °Some people are more likely than others to develop blood clots. Risk factors include:  °· Smoking. °· Being overweight (obese). °· Sitting or lying still for a long time. This includes long-distance travel, paralysis, or recovery from an illness or surgery. °Other factors that increase risk are:  °· Older age, especially over 75 years of age. °· Having a family history of blood clots or if you have already had a blot clot. °· Having major or lengthy surgery. This is especially true for surgery on the hip, knee, or belly (abdomen). Hip surgery is particularly high risk. °· Having a long, thin tube (catheter) placed inside a vein during a medical procedure. °· Breaking a hip or leg. °· Having cancer or cancer treatment. °· Pregnancy and childbirth. °¨ Hormone changes make the blood clot more easily during pregnancy. °¨ The fetus puts pressure on the veins of the pelvis. °¨ There is a risk of injury to veins during delivery or a caesarean delivery. The risk is highest just after childbirth. °· Medicines containing the female hormone estrogen. This includes birth control pills and  hormone replacement therapy. °· Other circulation or heart problems. ° °SIGNS AND SYMPTOMS °When a clot forms, it can either partially or totally block the blood flow in that vein. Symptoms of a DVT can include: °· Swelling of the leg or arm, especially if one side is much worse. °· Warmth and redness of the leg or arm, especially if one side is much worse. °· Pain in an arm or leg. If the clot is in the leg, symptoms may be more noticeable or worse when standing or walking. °The symptoms of a DVT that has traveled to the lungs (pulmonary embolism, PE) usually start suddenly and include: °· Shortness of breath. °· Coughing. °· Coughing up blood or blood-tinged mucus. °· Chest pain. The chest pain is often worse with deep breaths. °· Rapid heartbeat. °Anyone with these symptoms should get emergency medical treatment right away. Do not wait to see if the symptoms will go away. Call your local emergency services (911 in the [place] if you have these symptoms. Do not drive yourself to the hospital. °DIAGNOSIS °If a DVT is suspected, your health care provider will take a full medical history and perform a physical exam. Tests that also may be required include: °· Blood tests, including studies of the clotting properties of the blood. °· Ultrasound to see if you have clots in your legs or lungs. °· X-rays to show the flow of blood when dye is injected into the veins (venogram). °· Studies of your lungs if you have any   chest symptoms. °PREVENTION °· Exercise the legs regularly. Take a brisk 30-minute walk every day. °· Maintain a weight that is appropriate for your height. °· Avoid sitting or lying in bed for long periods of time without moving your legs. °· Women, particularly those over the age of 35 years, should consider the risks and benefits of taking estrogen medicines, including birth control pills. °· Do not smoke, especially if you take estrogen medicines. °· Long-distance travel can increase your risk of DVT. You  should exercise your legs by walking or pumping the muscles every hour. °· Many of the risk factors above relate to situations that exist with hospitalization, either for illness, injury, or elective surgery. Prevention may include medical and nonmedical measures. °¨ Your health care provider will assess you for the need for venous thromboembolism prevention when you are admitted to the hospital. If you are having surgery, your surgeon will assess you the day of or day after surgery. °TREATMENT °Once identified, a DVT can be treated. It can also be prevented in some circumstances. Once you have had a DVT, you may be at increased risk for a DVT in the future. The most common treatment for DVT is blood-thinning (anticoagulant) medicine, which reduces the blood's tendency to clot. Anticoagulants can stop new blood clots from forming and stop old clots from growing. They cannot dissolve existing clots. Your body does this by itself over time. Anticoagulants can be given by mouth, through an IV tube, or by injection. Your health care provider will determine the best program for you. Other medicines or treatments that may be used are: °· Heparin or related medicines (low molecular weight heparin) are often the first treatment for a blood clot. They act quickly. However, they cannot be taken orally and must be given either in shot form or by IV tube. °¨ Heparin can cause a fall in a component of blood that stops bleeding and forms blood clots (platelets). You will be monitored with blood tests to be sure this does not occur. °· Warfarin is an anticoagulant that can be swallowed. It takes a few days to start working, so usually heparin or related medicines are used in combination. Once warfarin is working, heparin is usually stopped. °· Factor Xa inhibitor medicines, such as rivaroxaban and apixaban, also reduce blood clotting. These medicines are taken orally and can often be used without heparin or related  medicines. °· Less commonly, clot dissolving drugs (thrombolytics) are used to dissolve a DVT. They carry a high risk of bleeding, so they are used mainly in severe cases where your life or a part of your body is threatened. °· Very rarely, a blood clot in the leg needs to be removed surgically. °· If you are unable to take anticoagulants, your health care provider may arrange for you to have a filter placed in a main vein in your abdomen. This filter prevents clots from traveling to your lungs. °HOME CARE INSTRUCTIONS °· Take all medicines as directed by your health care provider. °· Learn as much as you can about DVT. °· Wear a medical alert bracelet or carry a medical alert card. °· Ask your health care provider how soon you can go back to normal activities. It is important to stay active to prevent blood clots. If you are on anticoagulant medicine, avoid contact sports. °· It is very important to exercise. This is especially important while traveling, sitting, or standing for long periods of time. Exercise your legs by walking or by   tightening and relaxing your leg muscles regularly. Take frequent walks. °· You may need to wear compression stockings. These are tight elastic stockings that apply pressure to the lower legs. This pressure can help keep the blood in the legs from clotting. °Taking Warfarin °Warfarin is a daily medicine that is taken by mouth. Your health care provider will advise you on the length of treatment (usually 3-6 months, sometimes lifelong). If you take warfarin: °· Understand how to take warfarin and foods that can affect how warfarin works in your body. °· Too much and too little warfarin are both dangerous. Too much warfarin increases the risk of bleeding. Too little warfarin continues to allow the risk for blood clots. °Warfarin and Regular Blood Testing °While taking warfarin, you will need to have regular blood tests to measure your blood clotting time. These blood tests usually  include both the prothrombin time (PT) and international normalized ratio (INR) tests. The PT and INR results allow your health care provider to adjust your dose of warfarin. It is very important that you have your PT and INR tested as often as directed by your health care provider.    °Warfarin and Your Diet °Avoid major changes in your diet, or notify your health care provider before changing your diet. Arrange a visit with a registered dietitian to answer your questions. Many foods, especially foods high in vitamin K, can interfere with warfarin and affect the PT and INR results. You should eat a consistent amount of foods high in vitamin K. Foods high in vitamin K include:  °· Spinach, kale, broccoli, cabbage, collard and turnip greens, Brussels sprouts, peas, cauliflower, seaweed, and parsley. °· Beef and pork liver. °· Green tea. °· Soybean oil. °Warfarin with Other Medicines °Many medicines can interfere with warfarin and affect the PT and INR results. You must: °· Tell your health care provider about any and all medicines, vitamins, and supplements you take, including aspirin and other over-the-counter anti-inflammatory medicines. Be especially cautious with aspirin and anti-inflammatory medicines. Ask your health care provider before taking these. °· Do not take or discontinue any prescribed or over-the-counter medicine except on the advice of your health care provider or pharmacist. °Warfarin Side Effects °Warfarin can have side effects, such as easy bruising and difficulty stopping bleeding. Ask your health care provider or pharmacist about other side effects of warfarin. You will need to: °· Hold pressure over cuts for longer than usual. °· Notify your dentist and other health care providers that you are taking warfarin before you undergo any procedures where bleeding may occur. °Warfarin with Alcohol and Tobacco  °· Drinking alcohol frequently can increase the effect of warfarin, leading to excess  bleeding. It is best to avoid alcoholic drinks or to consume only very small amounts while taking warfarin. Notify your health care provider if you change your alcohol intake.   °· Do not use any tobacco products including cigarettes, chewing tobacco, or electronic cigarettes. If you smoke, quit. Ask your health care provider for help with quitting smoking. °Alternative Medicines to Warfarin: Factor Xa Inhibitor Medicines °· These blood-thinning medicines are taken by mouth, usually for several weeks or longer. It is important to take the medicine every single day at the same time each day. °· There are no regular blood tests required when using these medicines. °· There are fewer food and drug interactions than with warfarin. °· The side effects of this class of medicine are similar to those of warfarin, including excessive bruising or bleeding. Ask your   health care provider or pharmacist about other potential side effects. SEEK MEDICAL CARE IF:  You notice a rapid heartbeat.  You feel weaker or more tired than usual.  You feel faint.  You notice increased bruising.  You feel your symptoms are not getting better in the time expected.  You believe you are having side effects of medicine. SEEK IMMEDIATE MEDICAL CARE IF:  You have chest pain.  You have trouble breathing.  You have new or increased swelling or pain in one leg.  You cough up blood.  You notice blood in vomit, in a bowel movement, or in urine. MAKE SURE YOU:  Understand these instructions.  Will watch your condition.  Will get help right away if you are not doing well or get worse. Document Released: 02/10/2005 Document Revised: 06/27/2013 Document Reviewed: 10/18/2012 [facility] Patient Information 2015 [place], [place]. This information is not intended to replace advice given to you by your health care provider. Make sure you discuss any questions you have with your health care provider.  Rivaroxaban oral tablets What  is this medicine? RIVAROXABAN (ri va ROX a ban) is an anticoagulant (blood thinner). It is used to treat blood clots in the lungs or in the veins. It is also used after knee or hip surgeries to prevent blood clots. It is also used to lower the chance of stroke in people with a medical condition called atrial fibrillation. This medicine may be used for other purposes; ask your health care provider or pharmacist if you have questions. COMMON BRAND NAME(S): Xarelto, Xarelto Starter Pack What should I tell my health care provider before I take this medicine? They need to know if you have any of these conditions: -bleeding disorders -bleeding in the brain -blood in your stools (black or tarry stools) or if you have blood in your vomit -history of stomach bleeding -kidney disease -liver disease -low blood counts, like low white cell, platelet, or red cell counts -recent or planned spinal or epidural procedure -take medicines that treat or prevent blood clots -an unusual or allergic reaction to rivaroxaban, other medicines, foods, dyes, or preservatives -pregnant or trying to get pregnant -breast-feeding How should I use this medicine? Take this medicine by mouth with a glass of water. Follow the directions on the prescription label. Take your medicine at regular intervals. Do not take it more often than directed. Do not stop taking except on your doctor's advice. Stopping this medicine may increase your risk of a blot clot. Be sure to refill your prescription before you run out of medicine. If you are taking this medicine after hip or knee replacement surgery, take it with or without food. If you are taking this medicine for atrial fibrillation, take it with your evening meal. If you are taking this medicine to treat blood clots, take it with food at the same time each day. If you are unable to swallow your tablet, you may crush the tablet and mix it in applesauce. Then, immediately eat the applesauce.  You should eat more food right after you eat the applesauce containing the crushed tablet. Talk to your pediatrician regarding the use of this medicine in children. Special care may be needed. Overdosage: If you think you have taken too much of this medicine contact a poison control center or emergency room at once. NOTE: This medicine is only for you. Do not share this medicine with others. What if I miss a dose? If you take your medicine once a day and miss  a dose, take the missed dose as soon as you remember. If you take your medicine twice a day and miss a dose, take the missed dose immediately. In this instance, 2 tablets may be taken at the same time. The next day you should take 1 tablet twice a day as directed. What may interact with this medicine? -aspirin and aspirin-like medicines -certain antibiotics like erythromycin, azithromycin, and clarithromycin -certain medicines for fungal infections like ketoconazole and itraconazole -certain medicines for irregular heart beat like amiodarone, quinidine, dronedarone -certain medicines for seizures like carbamazepine, phenytoin -certain medicines that treat or prevent blood clots like warfarin, enoxaparin, and dalteparin -conivaptan -diltiazem -felodipine -indinavir -lopinavir; ritonavir -NSAIDS, medicines for pain and inflammation, like ibuprofen or naproxen -ranolazine -rifampin -ritonavir -St. John's wort -verapamil This list may not describe all possible interactions. Give your health care provider a list of all the medicines, herbs, non-prescription drugs, or dietary supplements you use. Also tell them if you smoke, drink alcohol, or use illegal drugs. Some items may interact with your medicine. What should I watch for while using this medicine? Visit your doctor or health care professional for regular checks on your progress. Your condition will be monitored carefully while you are receiving this medicine. Notify your doctor or  health care professional and seek emergency treatment if you develop breathing problems; changes in vision; chest pain; severe, sudden headache; pain, swelling, warmth in the leg; trouble speaking; sudden numbness or weakness of the face, arm, or leg. These can be signs that your condition has gotten worse. If you are going to have surgery, tell your doctor or health care professional that you are taking this medicine. Tell your health care professional that you use this medicine before you have a spinal or epidural procedure. Sometimes people who take this medicine have bleeding problems around the spine when they have a spinal or epidural procedure. This bleeding is very rare. If you have a spinal or epidural procedure while on this medicine, call your health care professional immediately if you have back pain, numbness or tingling (especially in your legs and feet), muscle weakness, paralysis, or loss of bladder or bowel control. Avoid sports and activities that might cause injury while you are using this medicine. Severe falls or injuries can cause unseen bleeding. Be careful when using sharp tools or knives. Consider using an Copy. Take special care brushing or flossing your teeth. Report any injuries, bruising, or red spots on the skin to your doctor or health care professional. What side effects may I notice from receiving this medicine? Side effects that you should report to your doctor or health care professional as soon as possible: -allergic reactions like skin rash, itching or hives, swelling of the face, lips, or tongue -back pain -redness, blistering, peeling or loosening of the skin, including inside the mouth -signs and symptoms of bleeding such as bloody or black, tarry stools; red or dark-brown urine; spitting up blood or brown material that looks like coffee grounds; red spots on the skin; unusual bruising or bleeding from the eye, gums, or nose Side effects that usually do not  require medical attention (Report these to your doctor or health care professional if they continue or are bothersome.): -dizziness -muscle pain This list may not describe all possible side effects. Call your doctor for medical advice about side effects. You may report side effects to FDA at 1-800-FDA-1088. Where should I keep my medicine? Keep out of the reach of children. Store at room temperature  between 15 and 30 degrees C (59 and 86 degrees F). Throw away any unused medicine after the expiration date. NOTE: This sheet is a summary. It may not cover all possible information. If you have questions about this medicine, talk to your doctor, pharmacist, or health care provider.  2015, [employer]. (2013-06-02 18:47:48)

## 2014-03-02 NOTE — ED Provider Notes (Signed)
CSN: [id number]     Arrival date & time 03/02/14  0813 History   First MD Initiated Contact with Patient 03/02/14 0831     Chief Complaint  Patient presents with  . Hypertension     (Consider location/radiation/quality/duration/timing/severity/associated sxs/prior Treatment) HPI Comments: Patient sent from endoscopy suite where she was scheduled to have a biliary stent removed.  She was found to be hypertensive at 221/111.  She was also at that time complaining of some right lower extremity swelling which has been present since time of her hospital discharge on 01/14/2014.  She states she is currently asymptomatic.  Specifically, she denies headache, confusion, numbness, weakness, chest pain, shortness of breath.  She does not have leg pain.  Patient is a 64 y.o. female presenting with hypertension.  Hypertension Pertinent negatives include no chest pain, no abdominal pain, no headaches and no shortness of breath.    Past Medical History  Diagnosis Date  . Hypertension   . Hypercholesteremia   . Shortness of breath   . Blood transfusion     11'15 last admission  . Stroke     '13- right side weakness, ambulates with cane  . Anemia    Past Surgical History  Procedure Laterality Date  . Abdominal hysterectomy    . Tee without cardioversion  03/06/2011    Procedure: TRANSESOPHAGEAL ECHOCARDIOGRAM (TEE);  Surgeon: Jolaine Artist, MD;  Location: [facility] ENDOSCOPY;  Service: Cardiovascular;  Laterality: N/A;  . Endoscopic retrograde cholangiopancreatography (ercp) with propofol N/A 01/11/2014    Procedure: ENDOSCOPIC RETROGRADE CHOLANGIOPANCREATOGRAPHY (ERCP) WITH PROPOFOL;  Surgeon: Gatha Mayer, MD;  Location: [facility];  Service: Endoscopy;  Laterality: N/A;  . Ercp     Family History  Problem Relation Age of Onset  . Anesthesia problems Neg Hx   . Hypotension Neg Hx   . Malignant hyperthermia Neg Hx   . Pseudochol deficiency Neg Hx   . Cancer Maternal Aunt   . Diabetes  Mellitus I Father   . Diabetes Mellitus I Sister    History  Substance Use Topics  . Smoking status: Former Smoker -- 0.25 packs/day    Types: Cigarettes    Quit date: 02/15/2013  . Smokeless tobacco: Never Used  . Alcohol Use: No   OB History    No data available     Review of Systems  Constitutional: Negative for fever, chills, diaphoresis, activity change, appetite change and fatigue.  HENT: Negative for congestion, facial swelling, rhinorrhea and sore throat.   Eyes: Negative for photophobia and discharge.  Respiratory: Negative for cough, chest tightness and shortness of breath.   Cardiovascular: Positive for leg swelling. Negative for chest pain and palpitations.  Gastrointestinal: Negative for nausea, vomiting, abdominal pain and diarrhea.  Endocrine: Negative for polydipsia and polyuria.  Genitourinary: Negative for dysuria, frequency, difficulty urinating and pelvic pain.  Musculoskeletal: Negative for back pain, arthralgias, neck pain and neck stiffness.  Skin: Negative for color change and wound.  Allergic/Immunologic: Negative for immunocompromised state.  Neurological: Negative for facial asymmetry, weakness, numbness and headaches.  Hematological: Does not bruise/bleed easily.  Psychiatric/Behavioral: Negative for confusion and agitation.      Allergies  Review of patient's allergies indicates no known allergies.  Home Medications   Prior to Admission medications   Medication Sig Start Date End Date Taking? Authorizing Provider  carvedilol (COREG) 12.5 MG tablet Take 1 tablet (12.5 mg total) by mouth 2 (two) times daily with a meal. 01/14/14  Yes Barton Dubois, MD  furosemide (  LASIX) 20 MG tablet Take 1 tablet (20 mg total) by mouth daily. 01/14/14  Yes Barton Dubois, MD  Multiple Vitamin (MULITIVITAMIN WITH MINERALS) TABS Take 1 tablet by mouth daily. 03/18/11  Yes Ivan Anchors Love, PA-C  potassium chloride 20 MEQ TBCR Take 20 mEq by mouth daily. 01/14/14  Yes  Barton Dubois, MD  XARELTO STARTER PACK 15 & 20 MG TBPK Take 15-20 mg by mouth as directed. Take as directed on package: Start with one 50m tablet by mouth twice a day with food. On Day 22, switch to one 242mtablet once a day with food. 03/02/14   MeErnestina PatchesMD   BP 160/92 mmHg  Pulse 74  Temp(Src) 97.7 F (36.5 C) (Oral)  Resp 15  SpO2 97% Physical Exam  Constitutional: She is oriented to person, place, and time. She appears well-developed and well-nourished. No distress.  HENT:  Head: Normocephalic and atraumatic.  Mouth/Throat: No oropharyngeal exudate.  Eyes: Pupils are equal, round, and reactive to light.  Neck: Normal range of motion. Neck supple.  Cardiovascular: Normal rate, regular rhythm and normal heart sounds.  Exam reveals no gallop and no friction rub.   No murmur heard. Pulmonary/Chest: Effort normal and breath sounds normal. No respiratory distress. She has no wheezes. She has no rales.  Abdominal: Soft. Bowel sounds are normal. She exhibits no distension and no mass. There is no tenderness. There is no rebound and no guarding.  Musculoskeletal: Normal range of motion. She exhibits no edema or tenderness.  Neurological: She is alert and oriented to person, place, and time.  Skin: Skin is warm and dry.  Psychiatric: She has a normal mood and affect.    ED Course  Procedures (including critical care time) Labs Review Labs Reviewed  BRAIN NATRIURETIC PEPTIDE - Abnormal; Notable for the following:    B Natriuretic Peptide 126.8 (*)    All other components within normal limits  I-STAT CHEM 8, ED - Abnormal; Notable for the following:    Potassium 3.4 (*)    Hemoglobin 10.9 (*)    HCT 32.0 (*)    All other components within normal limits    Imaging Review Dg Chest 2 View  03/02/2014   CLINICAL DATA:  [age]ear old female with cough and hypertension discovered during preop for scheduled biliary stent removal. Initial encounter.  EXAM: CHEST  2 VIEW  COMPARISON:   12/2013 and earlier.  FINDINGS: AP and lateral semi upright views at 0913 hrs. Chronic small pleural effusions or pleural scarring (favor the former given the CT Abdomen and Pelvis 01/09/2014). No pneumothorax or pulmonary edema. No consolidation or other confluent pulmonary opacity. Stable cardiac size and mediastinal contours. No acute osseous abnormality identified.  IMPRESSION: Continued small bilateral pleural effusions, stable since November. No new cardiopulmonary abnormality.   Electronically Signed   By: LeLars Pinks.D.   On: 03/02/2014 09:31     EKG Interpretation   Date/Time:  Thursday March 02 2014 09:58:16 EST Ventricular Rate:  72 PR Interval:  147 QRS Duration: 68 QT Interval:  435 QTC Calculation: 476 R Axis:   32 Text Interpretation:  Sinus rhythm Borderline T abnormalities, anterior  leads New T wave flattening in anterior leads since last EKG in '13  Confirmed by Amond Speranza  MD, Oluwadamilare Tobler ([phone] 03/02/2014 10:05:44 AM      MDM   Final diagnoses:  Cough  Secondary hypertension, unspecified  Peripheral edema  DVT (deep venous thrombosis), right    Pt is a [age].o.  female with Pmhx as above who presents  From outpatient endoscopy suite where she was scheduled to have a biliary stent removed and was found to be hypertensive with a systolic blood pressure of 221/111.  She also been complaining of some right lower extremity edema, which had been present since her discharge from hospital on 01/14/2014.  She states she otherwise been feeling well.  Denies headache, confusion, numbness, weakness, chest pain, shortness of breath, abdominal pain.  She had not taken her home antihypertensives this morning due to procedure was given 5 mg of IV labetalol and endoscopy and blood pressure at time of my initial examination is 148/89, HR O2 sats nml, cardiopulm exam & abdominal exam benign. Given prolonged hospital stay now w/ leg swelling, DVT study ordered, which was positive.  She does not  have signs or symptoms consistent with pulmonary embolism, and I do not feel she needs additional chest imaging at this time.  BNP ordered, which is not significantly elevated.  She is no acute cardiopulmonary changes on chest x-ray.  There is no evidence of end organ damage to the kidneys given her creatinine of 0.8.  Hemoglobin is 10.9. though one month ago was 9.9.   We had a discussion about treatment of DVT with Xarelto versus Coumadin.  Patient and family have decided on the Xarelto.  Starter kit has been ordered.  She will follow up with her primary doctor on Monday.  Strict return precautions given for new or worsening symptoms including the development of chest pain, shortness of breath, blood in stool, urine, uncontrolled nosebleeds passing out.  Will DC her 81 mg aspirin.   Ernestina Patches, MD 03/02/14 1149

## 2014-03-02 NOTE — Telephone Encounter (Signed)
-----   Message from Milus Banister, MD sent at 03/02/2014  7:45 AM EST ----- Katelyn [name], Her case was cancelled today by anesthesia (planned EUS/ERCP stent removal) due to extreme hypertension (220s/110s) that did not respond to labetolol.  Also found to have newly swollen right lower extremity. She was sent to ER for evaluation, triage. She's lost weight since discharge (7-8 pounds), no real pains but is nervous about eating.  CA 19-9 was very high.  Suspicion for neoplasm rising in my mind.     Casy Tavano, Please contact her PCP about her needed rov with them in next 5-7 days to discuss BP management.  Also lets put her back on for EUS/ERCP in 3-4 weeks from now.

## 2014-03-03 NOTE — Telephone Encounter (Signed)
Letter sent to Dr Zadie Rhine for anti coag response pt aware of the instructions and will speak with Dr Zadie Rhine on Monday

## 2014-03-03 NOTE — Telephone Encounter (Signed)
The pt is scheduled for an EUS on 04/06/14.  She has been put on Xarelto since 03/02/14, Dr Ardis Hughs, should she hold?

## 2014-03-03 NOTE — Telephone Encounter (Signed)
She needs to get in to see her PCP in ext 1-2 weeks for BP follow up.  Will ask that she advise on xarelto, would definitely prefer she not be on blood thinner for 03/2014 EUS, ERCP.  Can you contact Dr. Radene Ou' office to expedite?  Thanks

## 2014-03-08 NOTE — Telephone Encounter (Signed)
Left message on machine to call back  

## 2014-03-09 NOTE — Telephone Encounter (Signed)
Left message on machine to call back   I called Dr Radene Ou office and the pt was seen on 03/06/14 but had not started the xarelto at that time, she has a follow up on 03/13/14. The note from 03/06/14 is being faxed for review and we will get the note from the 03/13/14 note after that visit

## 2014-03-13 NOTE — Telephone Encounter (Signed)
The pt did start on xarelto, letter was resent to Dr Zadie Rhine regarding holding for EUS/ERCP on 04/06/14

## 2014-03-13 NOTE — Telephone Encounter (Signed)
Left message on machine to call back  

## 2014-03-20 NOTE — Telephone Encounter (Signed)
I spoke with Dr. Zadie Rhine. Urgency for ERCP is low.  We agreed that she complete full xarelto course (3 months) if possible.   Patty, Can you contact patient and have rov scheduled with myself or Dr. Carlean Purl in 5-6 weeks.  Will regroup with her, recheck LFTs at that point, if all going well will reschedule for EUS/ERCP that was cancelled day of procedure by anesthesia due to severe hypertension and found to have DVT.

## 2014-03-20 NOTE — Telephone Encounter (Signed)
appt has been cx for EUS ERCP, she has also been given an appt with Dr Carlean Purl for 05/08/14 at 1030 am pt is aware

## 2014-03-20 NOTE — Telephone Encounter (Signed)
Thanks for the update I am fine with seeing her if needed Agree with plans

## 2014-04-27 ENCOUNTER — Encounter (HOSPITAL_COMMUNITY): Payer: Self-pay

## 2014-04-27 ENCOUNTER — Ambulatory Visit (HOSPITAL_COMMUNITY): Admit: 2014-04-27 | Payer: Self-pay | Admitting: Gastroenterology

## 2014-04-27 SURGERY — UPPER ENDOSCOPIC ULTRASOUND (EUS) LINEAR
Anesthesia: General

## 2014-05-08 ENCOUNTER — Other Ambulatory Visit (INDEPENDENT_AMBULATORY_CARE_PROVIDER_SITE_OTHER): Payer: Medicare Other

## 2014-05-08 ENCOUNTER — Encounter: Payer: Self-pay | Admitting: Internal Medicine

## 2014-05-08 ENCOUNTER — Ambulatory Visit (INDEPENDENT_AMBULATORY_CARE_PROVIDER_SITE_OTHER): Payer: Medicare Other | Admitting: Internal Medicine

## 2014-05-08 VITALS — BP 116/64 | HR 68 | Ht <= 58 in | Wt 109.4 lb

## 2014-05-08 DIAGNOSIS — R932 Abnormal findings on diagnostic imaging of liver and biliary tract: Secondary | ICD-10-CM

## 2014-05-08 DIAGNOSIS — Z7901 Long term (current) use of anticoagulants: Secondary | ICD-10-CM

## 2014-05-08 DIAGNOSIS — I82401 Acute embolism and thrombosis of unspecified deep veins of right lower extremity: Secondary | ICD-10-CM | POA: Insufficient documentation

## 2014-05-08 HISTORY — DX: Acute embolism and thrombosis of unspecified deep veins of right lower extremity: I82.401

## 2014-05-08 LAB — COMPREHENSIVE METABOLIC PANEL
ALT: 22 U/L (ref 0–35)
AST: 28 U/L (ref 0–37)
Albumin: 3.9 g/dL (ref 3.5–5.2)
Alkaline Phosphatase: 135 U/L — ABNORMAL HIGH (ref 39–117)
BUN: 21 mg/dL (ref 6–23)
CALCIUM: 9.6 mg/dL (ref 8.4–10.5)
CHLORIDE: 102 meq/L (ref 96–112)
CO2: 36 meq/L — AB (ref 19–32)
CREATININE: 1.16 mg/dL (ref 0.40–1.20)
GFR: 60.36 mL/min (ref >60.00)
Glucose, Bld: 95 mg/dL (ref 70–99)
Potassium: 3.3 mEq/L — ABNORMAL LOW (ref 3.5–5.1)
Sodium: 142 mEq/L (ref 135–145)
Total Bilirubin: 0.5 mg/dL (ref 0.2–1.2)
Total Protein: 7.7 g/dL (ref 6.0–8.3)

## 2014-05-08 NOTE — Patient Instructions (Signed)
Your physician has requested that you go to the basement for the following lab work before leaving today: CMET  You have been scheduled for an ERCP.Marland Kitchen Please follow written instructions given to you at your visit today. If you use inhalers (even only as needed), please bring them with you on the day of your procedure. May sure and take your blood pressure medicine the day of your procedure. Do not take your xarelto the day of your procedure per Dr Carlean Purl.   I appreciate the opportunity to care for you. Silvano Rusk, M.D., [facility]

## 2014-05-08 NOTE — Progress Notes (Signed)
Subjective:    Patient ID: Katelyn [name], female    DOB: 1950-06-15, 64 y.o.   MRN: [id number] Cc: Follow-up for bile duct stent, abnormal ampulla, gallstone panctreatitis HPI The patient is here for follow-up. I met her in November 2015 at which point she had gallstone pancreatitis, cholecystitis and Klebsiella sepsis. Imaging had suggested an abnormal ampulla. At ERCP at looks floppy and edematous but not necessarily neoplastic. Because of the uncertainties and her illness I placed a biliary stent. This was plastic. There were some filling defects seen on cholangiogram, which could've been stones though we weren't sure. Subsequently she came for an EUS with Dr. Ardis Hughs but had very high blood pressure and new right lower extremity swelling that turned out to be a DVT. She was started on Xarelto. The leg is improving. She has not had any recurrent abdominal pain symptoms or signs of pancreatitis. She still has her plastic stent into the best of our knowledge.  No Known Allergies Outpatient Prescriptions Prior to Visit  Medication Sig Dispense Refill  . carvedilol (COREG) 12.5 MG tablet Take 1 tablet (12.5 mg total) by mouth 2 (two) times daily with a meal. 60 tablet 1  . furosemide (LASIX) 20 MG tablet Take 1 tablet (20 mg total) by mouth daily. 30 tablet 1  . Multiple Vitamin (MULITIVITAMIN WITH MINERALS) TABS Take 1 tablet by mouth daily.    . potassium chloride 20 MEQ TBCR Take 20 mEq by mouth daily. 30 tablet 1  . XARELTO STARTER PACK 15 & 20 MG TBPK Take 15-20 mg by mouth as directed. Take as directed on package: Start with one 30m tablet by mouth twice a day with food. On Day 22, switch to one 27mtablet once a day with food. 51 each 0   No facility-administered medications prior to visit.   Past Medical History  Diagnosis Date  . Hypertension   . Hypercholesteremia   . Shortness of breath   . Blood transfusion     11'15 last admission  . Stroke     '13- right side weakness,  ambulates with cane  . Anemia   . Right leg DVT 05/08/2014  . Acute gallstone pancreatitis 01/08/2014  . Septic shock - Kelbsiella 01/09/2014  . AKI (acute kidney injury) 01/08/2014  . Cholecystitis    Past Surgical History  Procedure Laterality Date  . Abdominal hysterectomy    . Tee without cardioversion  03/06/2011    Procedure: TRANSESOPHAGEAL ECHOCARDIOGRAM (TEE);  Surgeon: DaJolaine ArtistMD;  [facility];  Service: Cardiovascular;  Laterality: N/A;  . Endoscopic retrograde cholangiopancreatography (ercp) with propofol N/A 01/11/2014    Procedure: ENDOSCOPIC RETROGRADE CHOLANGIOPANCREATOGRAPHY (ERCP) WITH PROPOFOL;  Surgeon: CaGatha MayerMD;  [place] [facility] [place]: End[place];  Laterality: N/A;  . Ercp     History   Social History  . Marital Status: Widowed    Spouse Name: N/A  . Number of Children: N/A  . Years of Education: N/A   Social History Main Topics  . Smoking status: Former Smoker -- 0.25 packs/day    Types: Cigarettes    Quit date: 02/15/2013  . Smokeless tobacco: Never Used  . Alcohol Use: No  . Drug Use: No  . Sexual Activity: Not on file   Other Topics Concern  . None   Social History Narrative   Family History  Problem Relation Age of Onset  . Anesthesia problems Neg Hx   . Hypotension Neg Hx   . Malignant hyperthermia  Neg Hx   . Pseudochol deficiency Neg Hx   . Cancer Maternal Aunt   . Diabetes Mellitus I Father   . 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 The stent has been in since November so it needs to be changed. 2. Plan to do that within a couple of weeks. 3. Given the acute DVT I will leave her on her Xarelto and exchange the stent, I will not give her Xarelto that morning but later that day. There is some increased risk of bleeding potentially but just Timmothy Euler of the stent should not be a major issue for her in that setting. 4. Depending upon what is seen she could have an endoscopic ultrasound if still needed, we'll reassess the folds at the ampulla with the ERCP, I think they were probably edema and not neoplasia, she has filling defects ultimately will need sphincterotomy and stone extraction. She would have to be off her Xarelto and we could do that after 6 months of treatment. 5. CMET today 6. Needs to be considered for an elective cholecystectomy. 7. Explained all of this today -  Will reinforce. The risks and benefits as well as alternatives of endoscopic procedure(s) have been discussed and reviewed. All questions answered. The patient agrees to proceed.  I appreciate the opportunity to care for this patient. Emerson Monte, MD

## 2014-05-15 ENCOUNTER — Ambulatory Visit: Payer: Medicare Other | Admitting: Internal Medicine

## 2014-05-16 ENCOUNTER — Encounter (HOSPITAL_COMMUNITY): Payer: Self-pay | Admitting: *Deleted

## 2014-05-26 ENCOUNTER — Encounter (HOSPITAL_COMMUNITY)
Admission: RE | Disposition: A | Discharge status: Home / Self Care | Payer: Medicare Other | Source: Ambulatory Visit | Attending: Internal Medicine

## 2014-05-26 ENCOUNTER — Encounter (HOSPITAL_COMMUNITY): Payer: Self-pay | Admitting: Anesthesiology

## 2014-05-26 ENCOUNTER — Ambulatory Visit (HOSPITAL_COMMUNITY): Payer: Medicare Other | Admitting: Anesthesiology

## 2014-05-26 ENCOUNTER — Ambulatory Visit (HOSPITAL_COMMUNITY)
Admission: RE | Admit: 2014-05-26 | Discharge: 2014-05-26 | Disposition: A | Discharge status: Home / Self Care | Payer: Medicare Other | Source: Ambulatory Visit | Attending: Internal Medicine | Admitting: Internal Medicine

## 2014-05-26 ENCOUNTER — Ambulatory Visit (HOSPITAL_COMMUNITY): Payer: Medicare Other

## 2014-05-26 DIAGNOSIS — R261 Paralytic gait: Secondary | ICD-10-CM | POA: Insufficient documentation

## 2014-05-26 DIAGNOSIS — I739 Peripheral vascular disease, unspecified: Secondary | ICD-10-CM | POA: Insufficient documentation

## 2014-05-26 DIAGNOSIS — K831 Obstruction of bile duct: Secondary | ICD-10-CM | POA: Diagnosis not present

## 2014-05-26 DIAGNOSIS — E780 Pure hypercholesterolemia: Secondary | ICD-10-CM | POA: Insufficient documentation

## 2014-05-26 DIAGNOSIS — Z87891 Personal history of nicotine dependence: Secondary | ICD-10-CM | POA: Diagnosis not present

## 2014-05-26 DIAGNOSIS — K851 Biliary acute pancreatitis: Secondary | ICD-10-CM | POA: Insufficient documentation

## 2014-05-26 DIAGNOSIS — I69351 Hemiplegia and hemiparesis following cerebral infarction affecting right dominant side: Secondary | ICD-10-CM | POA: Insufficient documentation

## 2014-05-26 DIAGNOSIS — J387 Other diseases of larynx: Secondary | ICD-10-CM | POA: Diagnosis present

## 2014-05-26 DIAGNOSIS — T85590A Other mechanical complication of bile duct prosthesis, initial encounter: Secondary | ICD-10-CM

## 2014-05-26 DIAGNOSIS — R932 Abnormal findings on diagnostic imaging of liver and biliary tract: Secondary | ICD-10-CM

## 2014-05-26 DIAGNOSIS — I10 Essential (primary) hypertension: Secondary | ICD-10-CM | POA: Diagnosis not present

## 2014-05-26 DIAGNOSIS — Z4689 Encounter for fitting and adjustment of other specified devices: Secondary | ICD-10-CM | POA: Insufficient documentation

## 2014-05-26 HISTORY — PX: ERCP: SHX5425

## 2014-05-26 SURGERY — ERCP, WITH INTERVENTION IF INDICATED
Anesthesia: General

## 2014-05-26 MED ORDER — SODIUM CHLORIDE 0.9 % IV SOLN: INTRAVENOUS | Status: DC

## 2014-05-26 MED ORDER — PROPOFOL 10 MG/ML IV BOLUS
INTRAVENOUS | Status: AC
  Filled 2014-05-26: qty 20

## 2014-05-26 MED ORDER — SUCCINYLCHOLINE CHLORIDE 20 MG/ML IJ SOLN
INTRAMUSCULAR | Status: DC | PRN
  Administered 2014-05-26: 80 mg via INTRAVENOUS
  Administered 2014-05-26: 40 mg via INTRAVENOUS
  Administered 2014-05-26: 80 mg via INTRAVENOUS

## 2014-05-26 MED ORDER — LACTATED RINGERS IV SOLN
INTRAVENOUS | Status: DC
  Administered 2014-05-26: 1000 mL via INTRAVENOUS

## 2014-05-26 MED ORDER — AMPICILLIN-SULBACTAM SODIUM 1.5 (1-0.5) G IJ SOLR
1.5000 g | Freq: Once | INTRAMUSCULAR | Status: AC
  Administered 2014-05-26: 1.5 g via INTRAVENOUS
  Filled 2014-05-26: qty 1.5

## 2014-05-26 MED ORDER — SODIUM CHLORIDE 0.9 % IV SOLN
INTRAVENOUS | Status: DC | PRN
  Administered 2014-05-26: 45 mL

## 2014-05-26 MED ORDER — INDOMETHACIN 50 MG RE SUPP
100.0000 mg | Freq: Once | RECTAL | Status: AC
  Administered 2014-05-26: 100 mg via RECTAL
  Filled 2014-05-26: qty 2

## 2014-05-26 MED ORDER — GLUCAGON HCL RDNA (DIAGNOSTIC) 1 MG IJ SOLR
INTRAMUSCULAR | Status: AC
  Filled 2014-05-26: qty 1

## 2014-05-26 NOTE — H&P (View-Only) (Signed)
Subjective:    Patient ID: Katelyn [name], female    DOB: Apr 26, 1950, 64 y.o.   MRN: [id number] Cc: Follow-up for bile duct stent, abnormal ampulla, gallstone panctreatitis HPI The patient is here for follow-up. I met her in November 2015 at which point she had gallstone pancreatitis, cholecystitis and Klebsiella sepsis. Imaging had suggested an abnormal ampulla. At ERCP at looks floppy and edematous but not necessarily neoplastic. Because of the uncertainties and her illness I placed a biliary stent. This was plastic. There were some filling defects seen on cholangiogram, which could've been stones though we weren't sure. Subsequently she came for an EUS with Dr. Ardis Hughs but had very high blood pressure and new right lower extremity swelling that turned out to be a DVT. She was started on Xarelto. The leg is improving. She has not had any recurrent abdominal pain symptoms or signs of pancreatitis. She still has her plastic stent into the best of our knowledge.  No Known Allergies Outpatient Prescriptions Prior to Visit  Medication Sig Dispense Refill  . carvedilol (COREG) 12.5 MG tablet Take 1 tablet (12.5 mg total) by mouth 2 (two) times daily with a meal. 60 tablet 1  . furosemide (LASIX) 20 MG tablet Take 1 tablet (20 mg total) by mouth daily. 30 tablet 1  . Multiple Vitamin (MULITIVITAMIN WITH MINERALS) TABS Take 1 tablet by mouth daily.    . potassium chloride 20 MEQ TBCR Take 20 mEq by mouth daily. 30 tablet 1  . XARELTO STARTER PACK 15 & 20 MG TBPK Take 15-20 mg by mouth as directed. Take as directed on package: Start with one 17m tablet by mouth twice a day with food. On Day 22, switch to one 259mtablet once a day with food. 51 each 0   No facility-administered medications prior to visit.   Past Medical History  Diagnosis Date  . Hypertension   . Hypercholesteremia   . Shortness of breath   . Blood transfusion     11'15 last admission  . Stroke     '13- right side weakness,  ambulates with cane  . Anemia   . Right leg DVT 05/08/2014  . Acute gallstone pancreatitis 01/08/2014  . Septic shock - Kelbsiella 01/09/2014  . AKI (acute kidney injury) 01/08/2014  . Cholecystitis    Past Surgical History  Procedure Laterality Date  . Abdominal hysterectomy    . Tee without cardioversion  03/06/2011    Procedure: TRANSESOPHAGEAL ECHOCARDIOGRAM (TEE);  Surgeon: DaJolaine ArtistMD;  [facility];  Service: Cardiovascular;  Laterality: N/A;  . Endoscopic retrograde cholangiopancreatography (ercp) with propofol N/A 01/11/2014    Procedure: ENDOSCOPIC RETROGRADE CHOLANGIOPANCREATOGRAPHY (ERCP) WITH PROPOFOL;  Surgeon: CaGatha MayerMD;  [facility]: [facility];  Laterality: N/A;  . Ercp     History   Social History  . Marital Status: Widowed    Spouse Name: N/A  . Number of Children: N/A  . Years of Education: N/A   Social History Main Topics  . Smoking status: Former Smoker -- 0.25 packs/day    Types: Cigarettes    Quit date: 02/15/2013  . Smokeless tobacco: Never Used  . Alcohol Use: No  . Drug Use: No  . Sexual Activity: Not on file   Other Topics Concern  . None   Social History Narrative   Family History  Problem Relation Age of Onset  . Anesthesia problems Neg Hx   . Hypotension Neg Hx   . Malignant hyperthermia  Neg Hx   . Pseudochol deficiency Neg Hx   . Cancer Maternal Aunt   . Diabetes Mellitus I Father   . 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 The stent has been in since November so it needs to be changed. 2. Plan to do that within a couple of weeks. 3. Given the acute DVT I will leave her on her Xarelto and exchange the stent, I will not give her Xarelto that morning but later that day. There is some increased risk of bleeding potentially but just Timmothy Euler of the stent should not be a major issue for her in that setting. 4. Depending upon what is seen she could have an endoscopic ultrasound if still needed, we'll reassess the folds at the ampulla with the ERCP, I think they were probably edema and not neoplasia, she has filling defects ultimately will need sphincterotomy and stone extraction. She would have to be off her Xarelto and we could do that after 6 months of treatment. 5. CMET today 6. Needs to be considered for an elective cholecystectomy. 7. Explained all of this today -  Will reinforce. The risks and benefits as well as alternatives of endoscopic procedure(s) have been discussed and reviewed. All questions answered. The patient agrees to proceed.  I appreciate the opportunity to care for this patient. Emerson Monte, MD

## 2014-05-26 NOTE — Anesthesia Postprocedure Evaluation (Signed)
  Anesthesia Post-op Note  Patient: Katelyn [name]  Procedure(s) Performed: Procedure(s) (LRB): ENDOSCOPIC RETROGRADE CHOLANGIOPANCREATOGRAPHY (ERCP) (N/A)  Patient Location: PACU  Anesthesia Type: General  Level of Consciousness: awake and alert   Airway and Oxygen Therapy: Patient Spontanous Breathing  Post-op Pain: mild  Post-op Assessment: Post-op Vital signs reviewed, Patient's Cardiovascular Status Stable, Respiratory Function Stable, Patent Airway and No signs of Nausea or vomiting  Last Vitals:  Filed Vitals:   05/26/14 1110  BP: 121/79  Pulse: 71  Temp:   Resp: 14    Post-op Vital Signs: stable   Complications: No apparent anesthesia complications

## 2014-05-26 NOTE — Anesthesia Procedure Notes (Signed)
Procedure Name: Intubation Date/Time: 05/26/2014 9:44 AM Performed by: Dione Booze Pre-anesthesia Checklist: Patient identified, Emergency Drugs available, Suction available and Patient being monitored Patient Re-evaluated:Patient Re-evaluated prior to inductionOxygen Delivery Method: Circle system utilized Preoxygenation: Pre-oxygenation with 100% oxygen Intubation Type: IV induction Grade View: Grade II Tube type: Oral Tube size: 7.5 mm Number of attempts: 1 Airway Equipment and Method: Stylet Placement Confirmation: ETT inserted through vocal cords under direct vision,  breath sounds checked- equal and bilateral and positive ETCO2 Secured at: 20 cm Tube secured with: Tape Comments: Growth on epiglottis noted. Reported to [place].

## 2014-05-26 NOTE — Discharge Instructions (Addendum)
° °  The stent was changed without difficulty. We found a small growth on the epiglottis - a flap in the back of your throat. You need to see an ENT doctor about this. My office will make a referral.  I appreciate the opportunity to care for you. Gatha Mayer, MD, FACG   YOU HAD AN ENDOSCOPIC PROCEDURE TODAY: Refer to the procedure report and other information in the discharge instructions given to you for any specific questions about what was found during the examination. If this information does not answer your questions, please call Dr. Celesta Aver office at [phone] to clarify.   YOU SHOULD EXPECT: Some feelings of bloating in the abdomen. Passage of more gas than usual. Walking can help get rid of the air that was put into your GI tract during the procedure and reduce the bloating. If you had a lower endoscopy (such as a colonoscopy or flexible sigmoidoscopy) you may notice spotting of blood in your stool or on the toilet paper. Some abdominal soreness may be present for a day or two, also.  DIET: Your first meal following the procedure should be a light meal and then it is ok to progress to your normal diet. A half-sandwich or bowl of soup is an example of a good first meal. Heavy or fried foods are harder to digest and may make you feel nauseous or bloated. Drink plenty of fluids but you should avoid alcoholic beverages for 24 hours.   ACTIVITY: Your care partner should take you home directly after the procedure. You should plan to take it easy, moving slowly for the rest of the day. You can resume normal activity the day after the procedure however YOU SHOULD NOT DRIVE, use power tools, machinery or perform tasks that involve climbing or major physical exertion for 24 hours (because of the sedation medicines used during the test).   SYMPTOMS TO REPORT IMMEDIATELY: A gastroenterologist can be reached at any hour. Please call [phone]  for any of the following symptoms:  Following  upper endoscopy (EGD, EUS, ERCP, esophageal dilation) Vomiting of blood or coffee ground material  New, significant abdominal pain  New, significant chest pain or pain under the shoulder blades  Painful or persistently difficult swallowing  New shortness of breath  Black, tarry-looking or red, bloody stools  FOLLOW UP:  If any biopsies were taken you will be contacted by phone or by letter within the next 1-3 weeks. Call [phone]  if you have not heard about the biopsies in 3 weeks.  Please also call with any specific questions about appointments or follow up tests.

## 2014-05-26 NOTE — Op Note (Signed)
[facility] [place], [id number]   ERCP PROCEDURE REPORT        EXAM DATE: 05/26/2014  PATIENT NAME:          Katelyn, [name]          MR #: [id number] BIRTHDATE:       1951/02/23     VISIT #:     [phone] ATTENDING:     Gatha Mayer, MD, [facility]     STATUS:     outpatient  ASSISTANT:      Tera Mater and Sharon Mt   INDICATIONS:  The patient is a 64 yr old female here for an ERCP due to stent exchange. PROCEDURE PERFORMED:     ERCP with stent removal and  placement  MEDICATIONS:     Monitored anesthesia care and Per Anesthesia   CONSENT: The patient understands the risks and benefits of the procedure and understands that these risks include, but are not limited to: sedation, allergic reaction, infection, perforation and/or bleeding. Alternative means of evaluation and treatment include, among others: physical exam, x-rays, and/or surgical intervention. The patient elects to proceed with this endoscopic procedure.  DESCRIPTION OF PROCEDURE: During intra-op preparation period all mechanical & medical equipment was checked for proper function. Hand hygiene and appropriate measures for infection prevention was taken. After the risks, benefits and alternatives of the procedure were thoroughly explained, Informed was verified, confirmed and timeout was successfully executed by the treatment team. With the patient in left semi-prone position, medications were administered intravenously.The    was passed from the mouth into the esophagus and further advanced from the esophagus into the stomach. From stomach scope was directed to the second portion of the duodenum. Major papilla was aligned with the duodenoscope. The scope position was confirmed fluoroscopically. Rest of the findings/therapeutics are given below. The scope was then completely withdrawn from the patient and the procedure completed. The pulse, BP, and  O2 saturation were monitored and documented by the physician and the nursing staff throughout the entire procedure. The patient was cared for as planned according to standard protocol. The patient was then discharged to recovery in stable condition and with appropriate post procedure care.  1) Polypoid lesion of epiglottis seen by CRNA at intubation and with scope 2) Existing stent seen and removed easily with snare. Duodenal folds and papilla still prominent but less so - no mass. 3)  Cholangiogram  after wire-guided cannulation of the bile duct shows NL intrakepatics, mildly prominent CBD and CHDD with tapering of bile duct with stenosis at papilla. Pancreatogram not attempted. 4) 5 cm 10 Fr plastic stent placed. 4) Otherwise normal stomach and duodenum.    ADVERSE EVENT:     There were no complications. IMPRESSIONS:     1) Polypoid lesion of epiglottis seen by CRNA at intubation and with scope 2) Tapering of bile duct with stenosis at papilla - successful stent exchange. 5 cm 10 Fr plastic stent. No other intervention due to Xarelto Tx. 3) Some prominence of folds around papilla but less than before. 4) Otherwise normal stomach and duodenum  RECOMMENDATIONS:     1) Will refer to ENT for evaluation of epiglottis polypoid lesion 2) See me in early-mid June - my office to schedule - to contemplate stent removal and EUS - should be able to come off Xarelto in the summer (for DVT) REPEAT EXAM:     Return in 3 months for ERCP.  has stent in  place    ___________________________________ Gatha Mayer, MD, [facility] eSigned:  Gatha Mayer, MD, [facility] 05/26/2014 11:39 AM   cc: Milagros Evener, MD and The Patient  CPT CODES:     [phone] Endoscopic retrograde cholangiopancreatography (ERCP); w/ endoscopic retrograde insertion of tube or stent into bile or pancreatic duct ICD9 CODES:  The ICD and CPT codes recommended by this software are interpretations from the data that the  clinical staff has captured with the software.  The verification of the translation of this report to the ICD and CPT codes and modifiers is the sole responsibility of the health care institution and practicing physician where this report was generated.  [employer] will not be held responsible for the validity of the ICD and CPT codes included on this report.  AMA assumes no liability for data contained or not contained herein. CPT is a Designer, television/film set of the [employer].   PATIENT NAME:  Katelyn, [name] MR#: [id number]

## 2014-05-26 NOTE — Anesthesia Preprocedure Evaluation (Addendum)
Anesthesia Evaluation  Patient identified by MRN, date of birth, ID band Patient awake    Reviewed: Allergy & Precautions, NPO status , Patient's Chart, lab work & pertinent test results  Airway Mallampati: II  TM Distance: >3 FB Neck ROM: Full    Dental  (+) Edentulous Upper, Edentulous Lower   Pulmonary shortness of breath, former smoker,  breath sounds clear to auscultation  Pulmonary exam normal       Cardiovascular hypertension, Pt. on medications and Pt. on home beta blockers + Peripheral Vascular Disease Rhythm:Regular Rate:Normal     Neuro/Psych Right leg weakness  Neuromuscular disease CVA, Residual Symptoms    GI/Hepatic negative GI ROS, Neg liver ROS,   Endo/Other  negative endocrine ROS  Renal/GU Renal InsufficiencyRenal disease  negative genitourinary   Musculoskeletal negative musculoskeletal ROS (+)   Abdominal   Peds negative pediatric ROS (+)  Hematology  (+) anemia ,   Anesthesia Other Findings   Reproductive/Obstetrics negative OB ROS                           Anesthesia Physical Anesthesia Plan  ASA: III  Anesthesia Plan: General   Post-op Pain Management:    Induction: Intravenous  Airway Management Planned: Oral ETT  Additional Equipment:   Intra-op Plan:   Post-operative Plan: Extubation in OR  Informed Consent: I have reviewed the patients History and Physical, chart, labs and discussed the procedure including the risks, benefits and alternatives for the proposed anesthesia with the patient or authorized representative who has indicated his/her understanding and acceptance.   Dental advisory given  Plan Discussed with: CRNA  Anesthesia Plan Comments:         Anesthesia Quick Evaluation

## 2014-05-26 NOTE — Interval H&P Note (Signed)
History and Physical Interval Note:  05/26/2014 8:43 AM  Katelyn [name]  has presented today for surgery, with the diagnosis of Abnormal CT scan, ampulla bile duct  The various methods of treatment have been discussed with the patient and family. After consideration of risks, benefits and other options for treatment, the patient has consented to  Procedure(s): ENDOSCOPIC RETROGRADE CHOLANGIOPANCREATOGRAPHY (ERCP) (N/A) as a surgical intervention .  The patient's history has been reviewed, patient examined, no change in status, stable for surgery.  I have reviewed the patient's chart and labs.  Questions were answered to the patient's satisfaction.     Silvano Rusk

## 2014-05-26 NOTE — Transfer of Care (Signed)
Immediate Anesthesia Transfer of Care Note  Patient: Katelyn [name]  Procedure(s) Performed: Procedure(s): ENDOSCOPIC RETROGRADE CHOLANGIOPANCREATOGRAPHY (ERCP) (N/A)  Patient Location: PACU and Endoscopy Unit  Anesthesia Type:General  Level of Consciousness: awake, alert , oriented and patient cooperative  Airway & Oxygen Therapy: Patient Spontanous Breathing and Patient connected to face mask oxygen  Post-op Assessment: Report given to RN and Post -op Vital signs reviewed and stable  Post vital signs: Reviewed and stable  Last Vitals:  Filed Vitals:   05/26/14 0826  BP: 188/76  Pulse:   Temp:   Resp:     Complications: No apparent anesthesia complications

## 2014-05-29 ENCOUNTER — Encounter (HOSPITAL_COMMUNITY): Payer: Self-pay | Admitting: Internal Medicine

## 2014-07-04 ENCOUNTER — Telehealth: Payer: Self-pay

## 2014-07-04 NOTE — Telephone Encounter (Signed)
Patient is scheduled for ENT for Dr. Simeon Craft 07/11/14 9:40. [address] office number is [phone].  Notes faxed to [phone] Left message for patient to call back

## 2014-07-04 NOTE — Telephone Encounter (Signed)
-----   Message from Gatha Mayer, MD sent at 07/04/2014 11:20 AM EDT ----- Regarding: Has she had ENT referral Please see last ERCP note - she needs ENT evaluation please

## 2014-07-05 NOTE — Telephone Encounter (Signed)
Left message for patient to call back  

## 2014-07-06 NOTE — Telephone Encounter (Signed)
Left message for patient to call back  

## 2014-07-07 NOTE — Telephone Encounter (Signed)
Patient notified of the appt date and time.  She reports that she can't afford to pay the co-pay at this time.  She is going to call and reschedule the appt for when she gets paid.

## 2014-08-07 ENCOUNTER — Ambulatory Visit (INDEPENDENT_AMBULATORY_CARE_PROVIDER_SITE_OTHER): Payer: Medicare Other | Admitting: Internal Medicine

## 2014-08-07 ENCOUNTER — Encounter: Payer: Self-pay | Admitting: Internal Medicine

## 2014-08-07 VITALS — BP 150/80 | HR 58 | Ht <= 58 in | Wt 116.2 lb

## 2014-08-07 DIAGNOSIS — I82401 Acute embolism and thrombosis of unspecified deep veins of right lower extremity: Secondary | ICD-10-CM

## 2014-08-07 DIAGNOSIS — J387 Other diseases of larynx: Secondary | ICD-10-CM

## 2014-08-07 DIAGNOSIS — R932 Abnormal findings on diagnostic imaging of liver and biliary tract: Secondary | ICD-10-CM | POA: Diagnosis not present

## 2014-08-07 NOTE — Patient Instructions (Signed)
   Dr. Carlean Purl is going to talk to Dr. Milagros Evener at [facility] about holding your blood thinner for up coming procedures.   Today you have been given a copy of your ERCP to take to your ENT.   I appreciate the opportunity to care for you. Silvano Rusk, M.D., [facility]

## 2014-08-07 NOTE — Assessment & Plan Note (Signed)
Needs further evaluation but needs to be off xarelto EUS and stent removal needed Will discuss w/ Dr. Radene Ou

## 2014-08-07 NOTE — Assessment & Plan Note (Signed)
?   Can she hold xarelto for procedures

## 2014-08-07 NOTE — Assessment & Plan Note (Signed)
Seeing Dr. Simeon Craft tomorrow

## 2014-08-09 NOTE — Progress Notes (Signed)
Subjective:    Patient ID: Katelyn [name], female    DOB: December 03, 1950, 64 y.o.   MRN: [id number] Cc: f/u bile duct stent - abnormal ampulla and pancreas HPI The patient is here for f/u - she has a plastic biliary stent in place - last changed April 2016. It was originally placed because of ? Pancreatic mass abnormality raised by CT and MR showed changes of chronic pancreatitis. She had acute pancreatitis then. Had edematous folds precluding sphincterotomy then as we thought she had gallstone pancreatitis. Subsequently had DVT and went on Xarelto so biliary stent changed (no sig edema in duodenum then) Waiting for time that we can hold or stop that to reassess with EUS and remove stent. She has done well re: GI sxs.  At last ERCP a polypoid lesion of epiglottos was noted - she is to see ENT 6/14 - Dr. Simeon Craft  No Known Allergies Outpatient Prescriptions Prior to Visit  Medication Sig Dispense Refill  . carvedilol (COREG) 12.5 MG tablet Take 1 tablet (12.5 mg total) by mouth 2 (two) times daily with a meal. 60 tablet 1  . ferrous sulfate 325 (65 FE) MG tablet Take 325 mg by mouth 2 (two) times daily.    . furosemide (LASIX) 20 MG tablet Take 1 tablet (20 mg total) by mouth daily. 30 tablet 1  . Multiple Vitamin (MULITIVITAMIN WITH MINERALS) TABS Take 1 tablet by mouth daily.    . potassium chloride 20 MEQ TBCR Take 20 mEq by mouth daily. 30 tablet 1  . pravastatin (PRAVACHOL) 40 MG tablet Take 40 mg by mouth daily.    . rivaroxaban (XARELTO) 20 MG TABS tablet Take 20 mg by mouth daily with supper.     No facility-administered medications prior to visit.   Past Medical History  Diagnosis Date  . Hypertension   . Hypercholesteremia   . Shortness of breath   . Blood transfusion     11'15 last admission  . Stroke     '13- right side weakness, ambulates with cane  . Anemia   . Right leg DVT 05/08/2014  . Acute gallstone pancreatitis 01/08/2014  . Septic shock - Kelbsiella 01/09/2014  .  AKI (acute kidney injury) 01/08/2014  . Cholecystitis    Past Surgical History  Procedure Laterality Date  . Abdominal hysterectomy    . Tee without cardioversion  03/06/2011    Procedure: TRANSESOPHAGEAL ECHOCARDIOGRAM (TEE);  Surgeon: Jolaine Artist, MD;  Location: [facility] ENDOSCOPY;  Service: Cardiovascular;  Laterality: N/A;  . Endoscopic retrograde cholangiopancreatography (ercp) with propofol N/A 01/11/2014    Procedure: ENDOSCOPIC RETROGRADE CHOLANGIOPANCREATOGRAPHY (ERCP) WITH PROPOFOL;  Surgeon: Gatha Mayer, MD;  Location: [facility];  Service: Endoscopy;  Laterality: N/A;  . Ercp    . Ercp N/A 05/26/2014    Procedure: ENDOSCOPIC RETROGRADE CHOLANGIOPANCREATOGRAPHY (ERCP);  Surgeon: Gatha Mayer, MD;  Location: [facility] ENDOSCOPY;  Service: Endoscopy;  Laterality: N/A;   History   Social History  . Marital Status: Widowed    Spouse Name: N/A  . Number of Children: N/A  . Years of Education: N/A   Occupational History  . Retired    Social History Main Topics  . Smoking status: Former Smoker -- 0.25 packs/day    Types: Cigarettes    Quit date: 02/15/2013  . Smokeless tobacco: Never Used  . Alcohol Use: No  . Drug Use: No  . Sexual Activity: Not on file   Other Topics Concern  . None   Social History Narrative  Family History  Problem Relation Age of Onset  . Anesthesia problems Neg Hx   . Hypotension Neg Hx   . Malignant hyperthermia Neg Hx   . Pseudochol deficiency Neg Hx   . Breast cancer Maternal Aunt   . Diabetes Mellitus I Father   . Diabetes Mellitus I Sister     Review of Systems As above    Objective:   Physical Exam BP 150/80 mmHg  Pulse 58  Ht 4\' 3"  (1.295 m)  Wt 116 lb 4 oz (52.731 kg)  BMI 31.44 kg/m2     Assessment & Plan:  Abnormal CT scan, pancreas or bile duct  Right leg DVT  Lesion of epiglottis  She needs stent removed this summer and also EUS to better assess the pancreas. Ideally should be off Xarelto in case FNA is  needed.  I will contact Dr. Radene Ou about holding Gwenyth Bouillon for this - seems like after 6 months Tx reasonable to at least hold it for a few days.  FY:[id number], MD Ruby Cola, MD

## 2014-08-10 ENCOUNTER — Telehealth: Payer: Self-pay

## 2014-08-10 NOTE — Telephone Encounter (Signed)
Faxed the anti-coag clearance letter to Dr Radene Ou since she is not in [place].

## 2014-08-14 NOTE — Telephone Encounter (Signed)
Katelyn Merino, RN-CGRN received a call from Dr Radene Ou office today on her voice mail that Dr Radene Ou is out of the country and that she will not be back until 08/17/14 so hopefully later this week or next week will get an answer on her anti-coag clearance.

## 2014-08-25 NOTE — Telephone Encounter (Signed)
Re-faxed the anti-coag clearance letter to Dr. Radene Ou office today.

## 2014-08-31 NOTE — Telephone Encounter (Signed)
Received note from [facility] today that patient has appointment with Dr Radene Ou on 09/04/14 .  This information was put in Dr. Celesta Aver office to inform him as well.

## 2014-09-04 ENCOUNTER — Other Ambulatory Visit: Payer: Self-pay | Admitting: Family Medicine

## 2014-09-04 DIAGNOSIS — I82401 Acute embolism and thrombosis of unspecified deep veins of right lower extremity: Secondary | ICD-10-CM

## 2014-09-04 DIAGNOSIS — M6281 Muscle weakness (generalized): Secondary | ICD-10-CM | POA: Diagnosis not present

## 2014-09-15 ENCOUNTER — Telehealth: Payer: Self-pay

## 2014-09-15 ENCOUNTER — Other Ambulatory Visit: Payer: Self-pay

## 2014-09-15 DIAGNOSIS — R933 Abnormal findings on diagnostic imaging of other parts of digestive tract: Secondary | ICD-10-CM

## 2014-09-15 DIAGNOSIS — R9389 Abnormal findings on diagnostic imaging of other specified body structures: Secondary | ICD-10-CM

## 2014-09-15 DIAGNOSIS — R109 Unspecified abdominal pain: Secondary | ICD-10-CM

## 2014-09-15 NOTE — Telephone Encounter (Signed)
Left message on machine to call back Instructions have been mailed to the pt home.

## 2014-09-15 NOTE — Telephone Encounter (Signed)
-----   Message from Milus Banister, MD sent at 09/15/2014  6:20 AM EDT ----- Regarding: RE: EUs - finally? Glendell Docker, Reviewed the MRI, ERCPs.  We'll get her scheduled for that EUS now that OK to hold the xarelto.  Alexus Galka, She needs upper EUS + ERCP, next available EUS Thursday with anesthesia assistence, MAC or general.  For abnormal periampulla.  Will have to hold xarelto (already Ok'd by her PCP)  Thanks  dj  ----- Message -----    From: Gatha Mayer, MD    Sent: 09/14/2014   7:24 AM      To: Milus Banister, MD Subject: EUs - finally?                                 Linna Hoff,  This lady has been lingering due to anticoagulation issues but now have ok to hold her Xarelto.  She had something on CT? Thickened amplulla but overall neg ERCP and I placed a stent and replaced it while waiting for aniticoag hold.  Our original 2015 plan was for you to yank stent and do EUS.  Please tell me if you still think this is appropriate.(seems like this was swelling but I think following through is sensible)   Thanks  Hope vacation has treated you well.  Glendell Docker

## 2014-09-15 NOTE — Telephone Encounter (Signed)
Christian Mate, CMA is awaiting call back from patient to set up procedures.

## 2014-09-18 NOTE — Telephone Encounter (Signed)
Left message on machine to call back  

## 2014-09-19 NOTE — Telephone Encounter (Signed)
Left message on machine to call back  

## 2014-09-20 NOTE — Telephone Encounter (Signed)
Unable to reach pt for procedure instructions, I did mail a copy of the instructions to the home.  Pre cert at [facility] was also unable to reach the pt. We will see if she shows for the appt tomorrow

## 2014-09-20 NOTE — Progress Notes (Signed)
09-21-14 0930 Attempts x3 -with no returned calls from pt "Katelyn [name]" of Dr. Ardis Hughs office made aware. "Sharee Pimple" in Endo made aware.

## 2014-09-21 ENCOUNTER — Encounter (HOSPITAL_COMMUNITY): Payer: Self-pay | Admitting: Anesthesiology

## 2014-09-21 ENCOUNTER — Encounter (HOSPITAL_COMMUNITY): Admission: RE | Payer: Self-pay | Source: Ambulatory Visit

## 2014-09-21 ENCOUNTER — Ambulatory Visit (HOSPITAL_COMMUNITY)
Admission: RE | Admit: 2014-09-21 | Payer: Commercial Managed Care - HMO | Source: Ambulatory Visit | Admitting: Gastroenterology

## 2014-09-21 ENCOUNTER — Telehealth: Payer: Self-pay | Admitting: Gastroenterology

## 2014-09-21 SURGERY — UPPER ENDOSCOPIC ULTRASOUND (EUS) RADIAL
Anesthesia: Monitor Anesthesia Care

## 2014-09-21 NOTE — Anesthesia Preprocedure Evaluation (Deleted)
Anesthesia Evaluation  Patient identified by MRN, date of birth, ID band Patient awake    Reviewed: Allergy & Precautions, NPO status , Patient's Chart, lab work & pertinent test results  Airway Mallampati: II  TM Distance: >3 FB Neck ROM: Full    Dental no notable dental hx.    Pulmonary shortness of breath, former smoker,  breath sounds clear to auscultation  Pulmonary exam normal       Cardiovascular Exercise Tolerance: Good hypertension, Pt. on medications and Pt. on home beta blockers + Peripheral Vascular Disease negative cardio ROS Normal cardiovascular examRhythm:Regular Rate:Normal  ECHO Study Conclusions  - Left ventricle: The cavity size was normal. Wall thickness was normal. Systolic function was at the lower limits of normal. The estimated ejection fraction was in the range of 50% to 55%. Mild diffuse hypokinesis with no identifiable regional variations. Doppler parameters are consistent with abnormal left ventricular relaxation (grade 1 diastolic dysfunction). - Aortic valve: There was mild stenosis. There was moderate regurgitation directed centrally in the LVOT. Valve area (VTI): 1.43 cm^2. Valve area (Vmax): 1.12 cm^2. Valve area (Vmean): 1.24 cm^2.    Neuro/Psych  Neuromuscular disease CVA negative psych ROS   GI/Hepatic negative GI ROS, Neg liver ROS,   Endo/Other  negative endocrine ROS  Renal/GU Renal disease  negative genitourinary   Musculoskeletal negative musculoskeletal ROS (+)   Abdominal   Peds negative pediatric ROS (+)  Hematology  (+) anemia ,   Anesthesia Other Findings   Reproductive/Obstetrics negative OB ROS                            Anesthesia Physical Anesthesia Plan  ASA: III  Anesthesia Plan: MAC   Post-op Pain Management:    Induction: Intravenous  Airway Management Planned: Natural Airway  Additional Equipment:    Intra-op Plan:   Post-operative Plan:   Informed Consent: I have reviewed the patients History and Physical, chart, labs and discussed the procedure including the risks, benefits and alternatives for the proposed anesthesia with the patient or authorized representative who has indicated his/her understanding and acceptance.   Dental advisory given  Plan Discussed with: CRNA  Anesthesia Plan Comments:         Anesthesia Quick Evaluation

## 2014-09-21 NOTE — Telephone Encounter (Signed)
She did not show for her EUS, ERCP today.  Please try to contact her to reschedule for next available EUS Thursday with MAC sedation for EUS/ERCP.  Please do not book the case until we have actually spoken with her on phone or in person.  Thanks   Glendell Docker, [place]

## 2014-09-21 NOTE — Telephone Encounter (Signed)
Left message on machine to call back  

## 2014-09-22 NOTE — Telephone Encounter (Signed)
Left message on machine to call back  

## 2014-09-25 NOTE — Telephone Encounter (Signed)
Letter mailed to the pt home.  Unable to reach pt

## 2014-09-29 ENCOUNTER — Ambulatory Visit: Payer: Commercial Managed Care - HMO | Attending: Physical Medicine & Rehabilitation

## 2014-09-29 DIAGNOSIS — R29898 Other symptoms and signs involving the musculoskeletal system: Secondary | ICD-10-CM | POA: Diagnosis not present

## 2014-09-29 DIAGNOSIS — R2681 Unsteadiness on feet: Secondary | ICD-10-CM | POA: Diagnosis not present

## 2014-09-29 DIAGNOSIS — R269 Unspecified abnormalities of gait and mobility: Secondary | ICD-10-CM

## 2014-09-30 NOTE — Therapy (Signed)
[facility] [address] [place], [place], [address] Phone: [phone]   Fax:  [phone]  Physical Therapy Evaluation  Patient Details  Name: Katelyn [name] MRN: [id number] Date of Birth: 03/21/1950 Referring Provider:  Charlett Blake, MD  Encounter Date: 09/29/2014      PT End of Session - 09/30/14 1040    Visit Number 1   Number of Visits 17   Date for PT Re-Evaluation 11/29/14   Authorization Type G-code every 10th visit.   PT Start Time 1534   PT Stop Time 1615   PT Time Calculation (min) 41 min   Equipment Utilized During Treatment Gait belt   Activity Tolerance Patient tolerated treatment well   Behavior During Therapy WFL for tasks assessed/performed      Past Medical History  Diagnosis Date  . Hypertension   . Hypercholesteremia   . Shortness of breath   . Blood transfusion     11'15 last admission  . Stroke     '13- right side weakness, ambulates with cane  . Anemia   . Right leg DVT 05/08/2014  . Acute gallstone pancreatitis 01/08/2014  . Septic shock - Kelbsiella 01/09/2014  . AKI (acute kidney injury) 01/08/2014  . Cholecystitis     Past Surgical History  Procedure Laterality Date  . Abdominal hysterectomy    . Tee without cardioversion  03/06/2011    Procedure: TRANSESOPHAGEAL ECHOCARDIOGRAM (TEE);  Surgeon: Jolaine Artist, MD;  Location: [facility] ENDOSCOPY;  Service: Cardiovascular;  Laterality: N/A;  . Endoscopic retrograde cholangiopancreatography (ercp) with propofol N/A 01/11/2014    Procedure: ENDOSCOPIC RETROGRADE CHOLANGIOPANCREATOGRAPHY (ERCP) WITH PROPOFOL;  Surgeon: Gatha Mayer, MD;  Location: [facility];  Service: Endoscopy;  Laterality: N/A;  . Ercp    . Ercp N/A 05/26/2014    Procedure: ENDOSCOPIC RETROGRADE CHOLANGIOPANCREATOGRAPHY (ERCP);  Surgeon: Gatha Mayer, MD;  Location: [facility];  Service: Endoscopy;  Laterality: N/A;    There were no vitals filed  for this visit.  Visit Diagnosis:  Abnormality of gait - Plan: PT plan of care cert/re-cert  Weakness of both legs - Plan: PT plan of care cert/re-cert  Unsteadiness - Plan: PT plan of care cert/re-cert      Subjective Assessment - 09/29/14 1541    Subjective Pt reports she had a stroke in 2013 and did not complete her entire course of PT. She has noticed that a decline in the last few months: her balance is "off", R leg is weak and her brace is not helping her as she is unable to wear it after the blood clot (due to swelling).     Pertinent History CVA in 2013, R LE blood clot in 2015, pt reports minor GI surgery 6 months ago but is not sure what procedure   Patient [place] without worrying about balance, walk with better balance.   Currently in Pain? No/denies            [facility] PT Assessment - 09/29/14 1543    Assessment   Medical Diagnosis CVA   Onset Date/Surgical Date 02/25/11  pt not sure of exact date,balance worse over last few months   Hand Dominance Right   Prior Therapy acute care PT and SNF after CVA   Precautions   Precautions Fall   Precaution Comments based on BERG score   Restrictions   Weight Bearing Restrictions No   Balance Screen   Has the patient fallen in the past 6 months No  but reported stumbles and has to use hands on furniture   Has the patient had a decrease in activity level because of a fear of falling?  Yes   Is the patient reluctant to leave their home because of a fear of falling?  Yes   [place] Private residence   Living Arrangements Spouse/significant other  fiance   Available Help at Discharge Family   Type of [place] to enter   Entrance Stairs-Number of Steps 3   Entrance Stairs-Rails Can reach both   [place] One level   [place] - single point   Prior Function   Level of [place] device for independence   Vocation Retired    Associate Professor   Overall Cognitive Status Impaired/Different from baseline   Memory Impaired   Memory Impairment Decreased short term memory   Observation/Other Assessments   Focus on Therapeutic Outcomes (FOTO)  ABC: 31.3%   Observation/Other Assessments-Edema    Edema --  B LE edema (R LE >LLE)   Sensation   Light Touch Appears Intact  pt reported incr. R LE sensitivity to light touch   Additional Comments No N/T   Coordination   Gross Motor Movements are Fluid and Coordinated Yes   Fine Motor Movements are Fluid and Coordinated No   Heel Shin Test Slowed movements   Posture/Postural Control   Posture/Postural Control Postural limitations   Postural Limitations Forward head   ROM / Strength   AROM / PROM / Strength AROM;Strength   AROM   Overall AROM  Within functional limits for tasks performed   Strength   Overall Strength Deficits   Overall Strength Comments R LE: hip flex: 3+/5, R knee ext: 4/5, knee flex: 3+/5, DF: 4/5. L LE: hip flex: 3+/5, knee ext: 5/5, knee ext: 4/5, DF: 4/5.    Transfers   Transfers Sit to Stand;Stand to Sit   Sit to Stand 5: Supervision;Without upper extremity assist;From chair/3-in-1   Sit to Stand Details (indicate cue type and reason) Pt able to complete first 4 sit to stands without UE support but then required multiple attempts during 5th transfer 2/2 weakness and fatigue.   Stand to Sit 5: Supervision;Without upper extremity assist;To chair/3-in-1   Ambulation/Gait   Ambulation/Gait Yes   Ambulation/Gait Assistance 4: Min guard;5: Supervision   Ambulation/Gait Assistance Details Pt ran into wall on R side when ambulating back to exam room and was noted to use SPC in right hand, and R side is the affected side from CVA. Pt had difficulty walking in straight trajectory.   Ambulation Distance (Feet) 100 Feet   Assistive device Straight cane   Gait Pattern Step-through pattern;Decreased stride length;Decreased dorsiflexion - right;Narrow base of  support   Ambulation Surface Level;Indoor   Gait velocity 1.34ft/sec.  with SPC   Standardized Balance Assessment   Standardized Balance Assessment Berg Balance Test;Timed Up and Go Test   Berg Balance Test   Sit to Stand Able to stand without using hands and stabilize independently   Standing Unsupported Able to stand safely 2 minutes   Sitting with Back Unsupported but Feet Supported on Floor or Stool Able to sit safely and securely 2 minutes   Stand to Sit Sits safely with minimal use of hands   Transfers Able to transfer safely, minor use of hands   Standing Unsupported with Eyes Closed Able to stand 10 seconds with supervision   Standing Ubsupported with  Feet Together Able to place feet together independently and stand for 1 minute with supervision   From Standing, Reach Forward with Outstretched Arm Can reach forward >5 cm safely (2")   From Standing Position, Pick up Object from [place] to pick up shoe, needs supervision   From Standing Position, Turn to Look Behind Over each Shoulder Looks behind one side only/other side shows less weight shift   Turn 360 Degrees Needs close supervision or verbal cueing   Standing Unsupported, Alternately Place Feet on Step/Stool Needs assistance to keep from falling or unable to try   Standing Unsupported, One Foot in [place] to take small step independently and hold 30 seconds   Standing on One Leg Tries to lift leg/unable to hold 3 seconds but remains standing independently   Total Score 38   Timed Up and Go Test   TUG Normal TUG   Normal TUG (seconds) 15.55  with SPC                   [facility] Adult PT Treatment/Exercise - 09/30/14 1039    Ambulation/Gait   Ambulation/Gait Yes   Ambulation/Gait Assistance 4: Min guard;5: Supervision   Ambulation/Gait Assistance Details Cues and demonstration for sequencing with SPC in L hand vs. R hand, as R LE is the affected side from CVA. Pt demonstrated improve stability and safety with  SPC in left hand.   Ambulation Distance (Feet) --  50'x2, 20'   Assistive device Straight cane   Gait Pattern Step-through pattern;Decreased stride length;Decreased dorsiflexion - right;Narrow base of support   Ambulation Surface Level;Indoor                PT Education - 09/30/14 1037    Education provided Yes   Education Details PT duration/frequency, outcome measure results. PT also strongly encouraged pt to inform MD of increased B LE edema due to history of R LE DVT. Pt educated pt on the benefits of using RW vs. SPC, per BERG score. PT educated pt on proper use of SPC, see gait training.   Person(s) Educated Patient   Methods Explanation;Demonstration;Tactile cues;Verbal cues;Handout   Comprehension Returned demonstration;Verbalized understanding;Need further instruction          PT Short Term Goals - 09/30/14 1044    PT SHORT TERM GOAL #1   Title Pt will be independent in HEP to improve strength and balance. Target date: 10/27/14.   Status New   PT SHORT TERM GOAL #2   Title Pt will ambulate 300' over even terrain with LRAD and supervision to improve functional mobility. Target date: 10/27/14.   Status New   PT SHORT TERM GOAL #3   Title Pt will improve BERG score from 38/56 to 42/56 to decrease falls risk and to stand and cook at home safely.. Target date: 10/27/14.   Status New   PT SHORT TERM GOAL #4   Title Pt will improve TUG time with LRAD from 15.55 sec. to </=13.5 seconds to decrease falls risk. Target date: 10/27/14.           PT Long Term Goals - 09/30/14 1046    PT LONG TERM GOAL #1   Title Pt will verbalize understanding of CVA risk factors/signs and symptoms to decr. risk of future CVA. Target date: 11/24/14.   Status New   PT LONG TERM GOAL #2   Title Pt will improve gait spee to >/=2.28ft/sec with LRAD to ambulate safely in the community. Target date: 11/24/14.  Status New   PT LONG TERM GOAL #3   Title Pt will ambulate 600' over even/uneven terrain  with LRAD at MOD I level to improve functional mobility. Target date: 11/24/14.   Status New   PT LONG TERM GOAL #4   Title Pt will improve ABC score by 10% to improve quality of life. Target date: 11/24/14.   Status New   PT LONG TERM GOAL #5   Title Pt will improve BERG score to >/=46/56 to decrease falls risk. Target date: 11/24/14.   Status New   Additional Long Term Goals   Additional Long Term Goals Yes   PT LONG TERM GOAL #6   Title Pt will report she is able to stand for 30 minutes while cooking, without LOB, in order to cook a meal at home safely. Target date: 11/24/14.   Status New               Plan - 09/30/14 1040    Clinical Impression Statement Pt is a pleasant 64y/o woman presenting to [facility] neuro s/p CVA in 2013. Pt did not finish her course of PT after CVA. Pt presented with impaired balance, decreased strength (R LE>L LE), decreased safety awareness during ambulation, difficulty sequencing with SPC, and decreased endurance. Pt's BERG score, TUG time, and gait speed indicate she is at risk for falls.  Pt will bring in her brace next session for PT to assess it.   Pt will benefit from skilled therapeutic intervention in order to improve on the following deficits Abnormal gait;Decreased safety awareness;Decreased endurance;Decreased knowledge of use of DME;Decreased balance;Decreased mobility;Decreased strength   Rehab Potential Good   Clinical Impairments Affecting Rehab Potential CVA was in 2013   PT Frequency 2x / week   PT Duration 8 weeks   PT Treatment/Interventions ADLs/Self Care Home Management;Balance training;Neuromuscular re-education;Biofeedback;Therapeutic exercise;Manual techniques;Therapeutic activities;Functional mobility training;Orthotic Fit/Training;Stair training;Gait training;DME Instruction;Patient/family education;Vestibular   PT Next Visit Plan Initiate balance/strengthening HEP.   Consulted and Agree with Plan of Care Patient          G-Codes -  10/05/2014 1049    Functional Assessment Tool Used BERG: 38/56; gait speed with SPC: 1.31ft/sec; TUG with SPC: 15.55 sec.   Functional Limitation Mobility   Mobility: Walking and Moving Around Current Status [phone]) CK   Mobility: Walking and Moving Around Goal Status (V8938) CJ       Problem List Patient Active Problem List   Diagnosis Date Noted  . Lesion of epiglottis 05/26/2014  . Abnormal CT scan, pancreas or bile duct   . Right leg DVT 05/08/2014  . Anemia 01/09/2014  . Hip pain, right 05/06/2011  . 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 CKD (chronic kidney disease) stage 3, GFR 30-59 ml/min 03/04/2011  . Diastolic dysfunction 12/10/5100  . Tobacco abuse 03/04/2011  . Constipation 03/04/2011  . Malignant hypertensive urgency 03/01/2011  . CVA (cerebral infarction) 03/01/2011  . NEUROPATHY, IDIOPATHIC 10/25/2007  . Hypercholesteremia 12/15/2006  . Essential hypertension 12/15/2006    Miller,Jennifer L 09/30/2014, 10:53 AM  [facility] [address] [place], [place], [address] Phone: [phone]   Fax:  [phone]    Geoffry Paradise, PT,DPT 09/30/2014 10:53 AM Phone: [phone] Fax: [phone]

## 2014-10-06 ENCOUNTER — Ambulatory Visit: Payer: Commercial Managed Care - HMO

## 2014-10-11 ENCOUNTER — Ambulatory Visit: Payer: Commercial Managed Care - HMO | Admitting: Physical Therapy

## 2014-10-13 ENCOUNTER — Ambulatory Visit: Payer: Commercial Managed Care - HMO | Admitting: Physical Therapy

## 2014-10-20 ENCOUNTER — Ambulatory Visit: Payer: Commercial Managed Care - HMO

## 2014-10-24 ENCOUNTER — Ambulatory Visit: Payer: Commercial Managed Care - HMO

## 2014-10-24 NOTE — Therapy (Signed)
[facility] [address] [place], [place], [address] Phone: [phone]   Fax:  [phone]  Patient Details  Name: Katelyn [name] MRN: [id number] Date of Birth: 03/26/1950 Referring Provider:  No ref. provider found  Encounter Date: 10/24/2014  PHYSICAL THERAPY DISCHARGE SUMMARY  Visits from Start of Care: 1  Current functional level related to goals / functional outcomes:     PT Short Term Goals - 09/30/14 1044    PT SHORT TERM GOAL #1   Title Pt will be independent in HEP to improve strength and balance. Target date: 10/27/14.   Status New   PT SHORT TERM GOAL #2   Title Pt will ambulate 300' over even terrain with LRAD and supervision to improve functional mobility. Target date: 10/27/14.   Status New   PT SHORT TERM GOAL #3   Title Pt will improve BERG score from 38/56 to 42/56 to decrease falls risk and to stand and cook at home safely.. Target date: 10/27/14.   Status New   PT SHORT TERM GOAL #4   Title Pt will improve TUG time with LRAD from 15.55 sec. to </=13.5 seconds to decrease falls risk. Target date: 10/27/14.         PT Long Term Goals - 09/30/14 1046    PT LONG TERM GOAL #1   Title Pt will verbalize understanding of CVA risk factors/signs and symptoms to decr. risk of future CVA. Target date: 11/24/14.   Status New   PT LONG TERM GOAL #2   Title Pt will improve gait spee to >/=2.66f/sec with LRAD to ambulate safely in the community. Target date: 11/24/14.   Status New   PT LONG TERM GOAL #3   Title Pt will ambulate 600' over even/uneven terrain with LRAD at MOD I level to improve functional mobility. Target date: 11/24/14.   Status New   PT LONG TERM GOAL #4   Title Pt will improve ABC score by 10% to improve quality of life. Target date: 11/24/14.   Status New   PT LONG TERM GOAL #5   Title Pt will improve BERG score to >/=46/56 to decrease falls risk. Target date: 11/24/14.   Status New   Additional Long Term Goals   Additional Long Term Goals Yes   PT LONG TERM GOAL #6   Title Pt will report she is able to stand for 30 minutes while cooking, without LOB, in order to cook a meal at home safely. Target date: 11/24/14.   Status New        Remaining deficits: Unknown, as pt never returned after initial eval. [facility] to [facility] patient [facility], and she no-showed to 3 PT appointments, thus, she is being discharged from PT.   Education / Equipment: PT frequency/duration, PT benefits  Plan: Patient agrees to discharge.  Patient goals were not met. Patient is being discharged due to not returning since the last visit.  ?????       Heberto Sturdevant L 10/24/2014, 9:07 AM  [facility] [address]Phone: [phone]  Fax:  [phone]  JGeoffry Paradise PT,DPT 10/24/2014 9:07 AM Phone: [phone]: [phone]

## 2014-10-26 ENCOUNTER — Ambulatory Visit: Payer: Commercial Managed Care - HMO

## 2014-11-08 NOTE — Telephone Encounter (Signed)
I spoke with her daughter and the patient.  She does not have the money to come for procedures or office visit until October.  She agreed to come for 12/01/14 11:15 to discuss additional testing.  I stressed the importance of keeping this appt.

## 2014-11-08 NOTE — Telephone Encounter (Signed)
Please try to find this lady - she has a bile duct stent in and it needs to come out at some point  She was to have an EUS and stent removal but we have not seen or heard from her

## 2014-12-01 ENCOUNTER — Ambulatory Visit (INDEPENDENT_AMBULATORY_CARE_PROVIDER_SITE_OTHER): Payer: Commercial Managed Care - HMO | Admitting: Internal Medicine

## 2014-12-01 ENCOUNTER — Encounter: Payer: Self-pay | Admitting: Internal Medicine

## 2014-12-01 VITALS — BP 150/90 | HR 76 | Ht <= 58 in | Wt 122.2 lb

## 2014-12-01 DIAGNOSIS — R932 Abnormal findings on diagnostic imaging of liver and biliary tract: Secondary | ICD-10-CM

## 2014-12-01 DIAGNOSIS — Z7901 Long term (current) use of anticoagulants: Secondary | ICD-10-CM

## 2014-12-01 DIAGNOSIS — I82401 Acute embolism and thrombosis of unspecified deep veins of right lower extremity: Secondary | ICD-10-CM

## 2014-12-01 NOTE — Progress Notes (Signed)
Subjective:    Patient ID: Katelyn [name], female    DOB: 06-11-1950, 64 y.o.   MRN: [id number] If complaint: Follow-up of abnormal pancreas on CT and indwelling biliary stent HPI Patient returns for the above complaint. Last year she had a possible abnormality in the pancreas or the duodenal wall. She had pancreatitis. There were variable interpretations of different study suggesting possible chronic pancreatitis. It was thought perhaps she had biliary pancreatitis. There is some stenosis and edematous changes in the duodenal wall at ERCP, I placed the stent and then replace this stent while were waiting for her to be able to hold Xarelto which was started for DVT. She was unable to make an appointment this summer for follow-up EUS with stent removal and presents today ready to do so. She denies any complaints, she is not having diarrhea or abdominal pain or signs of jaundice. There is a remote history of alcohol use many years ago but not currently.  No Known Allergies Outpatient Prescriptions Prior to Visit  Medication Sig Dispense Refill  . carvedilol (COREG) 12.5 MG tablet Take 1 tablet (12.5 mg total) by mouth 2 (two) times daily with a meal. 60 tablet 1  . ferrous sulfate 325 (65 FE) MG tablet Take 325 mg by mouth 2 (two) times daily.    . furosemide (LASIX) 20 MG tablet Take 1 tablet (20 mg total) by mouth daily. 30 tablet 1  . Multiple Vitamin (MULITIVITAMIN WITH MINERALS) TABS Take 1 tablet by mouth daily.    . potassium chloride 20 MEQ TBCR Take 20 mEq by mouth daily. 30 tablet 1  . pravastatin (PRAVACHOL) 40 MG tablet Take 40 mg by mouth daily.    . rivaroxaban (XARELTO) 20 MG TABS tablet Take 20 mg by mouth daily with supper.     No facility-administered medications prior to visit.   Past Medical History  Diagnosis Date  . Hypertension   . Hypercholesteremia   . Shortness of breath   . Blood transfusion     11'15 last admission  . Stroke [facility])     '13- right side  weakness, ambulates with cane  . Anemia   . Right leg DVT ([place]) 05/08/2014  . Acute gallstone pancreatitis 01/08/2014  . Septic shock - Kelbsiella 01/09/2014  . AKI (acute kidney injury) ([facility]) 01/08/2014  . Cholecystitis    Past Surgical History  Procedure Laterality Date  . Abdominal hysterectomy    . Tee without cardioversion  03/06/2011    Procedure: TRANSESOPHAGEAL ECHOCARDIOGRAM (TEE);  Surgeon: Jolaine Artist, MD;  Location: [facility] ENDOSCOPY;  Service: Cardiovascular;  Laterality: N/A;  . Endoscopic retrograde cholangiopancreatography (ercp) with propofol N/A 01/11/2014    Procedure: ENDOSCOPIC RETROGRADE CHOLANGIOPANCREATOGRAPHY (ERCP) WITH PROPOFOL;  Surgeon: Gatha Mayer, MD;  Location: [facility];  Service: Endoscopy;  Laterality: N/A;  . Ercp    . Ercp N/A 05/26/2014    Procedure: ENDOSCOPIC RETROGRADE CHOLANGIOPANCREATOGRAPHY (ERCP);  Surgeon: Gatha Mayer, MD;  Location: [facility];  Service: Endoscopy;  Laterality: N/A;   Social History   Social History  . Marital Status: Widowed    Spouse Name: N/A  . Number of Children: N/A  . Years of Education: N/A   Occupational History  . Retired    Social History Main Topics  . Smoking status: Former Smoker -- 0.25 packs/day    Types: Cigarettes    Quit date: 02/15/2013  . Smokeless tobacco: Never Used  . Alcohol Use: No  . Drug Use: No  .  Sexual Activity: Not Asked   Other Topics Concern  . None   Social History Narrative   Family History  Problem Relation Age of Onset  . Anesthesia problems Neg Hx   . Hypotension Neg Hx   . Malignant hyperthermia Neg Hx   . Pseudochol deficiency Neg Hx   . Breast cancer Maternal Aunt   . Diabetes Mellitus I Father   . Diabetes Mellitus I Sister   . Colon cancer Neg Hx   . Esophageal cancer Neg Hx   . Pancreatic cancer Neg Hx   . Kidney disease Neg Hx   . Liver disease Neg Hx        Review of Systems As above    Objective:   Physical Exam @BP  150/90 mmHg   Pulse 76  Ht 4\' 10"  (1.473 m)  Wt 122 lb 3.2 oz (55.43 kg)  BMI 25.55 kg/m2@  General:  NAD Eyes:   anicteric Lungs:  clear Heart:: S1S2 no rubs, murmurs or gallops Abdomen:  soft and nontender, BS+ Ext:   no edema, cyanosis or clubbing    Data Reviewed:  Previous ERCPs and imaging.     Assessment & Plan:   1. Abnormal CT scan, pancreas or bile duct   2. Acute deep vein thrombosis (DVT) of right lower extremity, unspecified vein (HCC)   3. Long term current use of anticoagulant Xarelto    She has been scheduled for endoscopic ultrasound and stent removal next week by Dr. Ardis Hughs. My sense is that this was probably some sort of acute swelling and edema but on one of the CT scans are a suggestion of calcification in or near the head of the pancreas. Raised question of a mass.  I had previously contacted Dr. Radene Ou this summer and we both ought was reasonable to hold her Xarelto for 2 days given that her DVT was diagnosed in January and it's been over 6 months that she's been on treatment. He will hold that for 2 days prior to the procedure.  The risks and benefits as well as alternatives of endoscopic procedure(s) have been discussed and reviewed. All questions answered. The patient agrees to proceed.   CC: Milagros Evener, MD

## 2014-12-01 NOTE — Assessment & Plan Note (Addendum)
EUS Stent removal Hold xarelto

## 2014-12-01 NOTE — Patient Instructions (Addendum)
   You have been scheduled for an EUS with stent removal with Dr Ardis Hughs. Please follow written instructions given to you at your visit today. If you use inhalers (even only as needed), please bring them with you on the day of your procedure.  Hold your xarelto after the dose on Monday Oct. 10th.  I appreciate the opportunity to care for you. Silvano Rusk, MD, [facility]

## 2014-12-06 ENCOUNTER — Encounter (HOSPITAL_COMMUNITY): Payer: Self-pay | Admitting: *Deleted

## 2014-12-07 ENCOUNTER — Ambulatory Visit (HOSPITAL_COMMUNITY)
Admission: RE | Admit: 2014-12-07 | Discharge: 2014-12-07 | Disposition: A | Discharge status: Home / Self Care | Payer: Commercial Managed Care - HMO | Source: Ambulatory Visit | Attending: Gastroenterology | Admitting: Gastroenterology

## 2014-12-07 ENCOUNTER — Encounter (HOSPITAL_COMMUNITY): Payer: Self-pay

## 2014-12-07 ENCOUNTER — Encounter (HOSPITAL_COMMUNITY)
Admission: RE | Disposition: A | Discharge status: Home / Self Care | Payer: Self-pay | Source: Ambulatory Visit | Attending: Gastroenterology

## 2014-12-07 ENCOUNTER — Ambulatory Visit (HOSPITAL_COMMUNITY): Payer: Commercial Managed Care - HMO | Admitting: Anesthesiology

## 2014-12-07 ENCOUNTER — Telehealth: Payer: Self-pay

## 2014-12-07 DIAGNOSIS — R109 Unspecified abdominal pain: Secondary | ICD-10-CM

## 2014-12-07 DIAGNOSIS — Z4659 Encounter for fitting and adjustment of other gastrointestinal appliance and device: Secondary | ICD-10-CM | POA: Insufficient documentation

## 2014-12-07 DIAGNOSIS — Z87891 Personal history of nicotine dependence: Secondary | ICD-10-CM | POA: Insufficient documentation

## 2014-12-07 DIAGNOSIS — R932 Abnormal findings on diagnostic imaging of liver and biliary tract: Secondary | ICD-10-CM | POA: Diagnosis not present

## 2014-12-07 DIAGNOSIS — R9389 Abnormal findings on diagnostic imaging of other specified body structures: Secondary | ICD-10-CM

## 2014-12-07 DIAGNOSIS — G709 Myoneural disorder, unspecified: Secondary | ICD-10-CM | POA: Insufficient documentation

## 2014-12-07 DIAGNOSIS — Z79899 Other long term (current) drug therapy: Secondary | ICD-10-CM | POA: Insufficient documentation

## 2014-12-07 DIAGNOSIS — Z86718 Personal history of other venous thrombosis and embolism: Secondary | ICD-10-CM | POA: Diagnosis not present

## 2014-12-07 DIAGNOSIS — Z7901 Long term (current) use of anticoagulants: Secondary | ICD-10-CM | POA: Diagnosis not present

## 2014-12-07 DIAGNOSIS — N183 Chronic kidney disease, stage 3 (moderate): Secondary | ICD-10-CM | POA: Diagnosis not present

## 2014-12-07 DIAGNOSIS — I739 Peripheral vascular disease, unspecified: Secondary | ICD-10-CM | POA: Insufficient documentation

## 2014-12-07 DIAGNOSIS — R933 Abnormal findings on diagnostic imaging of other parts of digestive tract: Secondary | ICD-10-CM | POA: Diagnosis not present

## 2014-12-07 DIAGNOSIS — I10 Essential (primary) hypertension: Secondary | ICD-10-CM | POA: Diagnosis not present

## 2014-12-07 DIAGNOSIS — E7800 Pure hypercholesterolemia, unspecified: Secondary | ICD-10-CM | POA: Insufficient documentation

## 2014-12-07 DIAGNOSIS — I129 Hypertensive chronic kidney disease with stage 1 through stage 4 chronic kidney disease, or unspecified chronic kidney disease: Secondary | ICD-10-CM | POA: Diagnosis not present

## 2014-12-07 HISTORY — PX: EUS: SHX5427

## 2014-12-07 HISTORY — PX: GASTROINTESTINAL STENT REMOVAL: SHX6384

## 2014-12-07 HISTORY — PX: ESOPHAGOGASTRODUODENOSCOPY (EGD) WITH PROPOFOL: SHX5813

## 2014-12-07 SURGERY — UPPER ENDOSCOPIC ULTRASOUND (EUS) LINEAR
Anesthesia: Monitor Anesthesia Care

## 2014-12-07 MED ORDER — PROPOFOL 10 MG/ML IV BOLUS
INTRAVENOUS | Status: AC
  Filled 2014-12-07: qty 20

## 2014-12-07 MED ORDER — SODIUM CHLORIDE 0.9 % IV SOLN: INTRAVENOUS | Status: DC

## 2014-12-07 MED ORDER — PROPOFOL 500 MG/50ML IV EMUL
INTRAVENOUS | Status: DC | PRN
  Administered 2014-12-07: 120 ug/kg/min via INTRAVENOUS

## 2014-12-07 MED ORDER — PROPOFOL 500 MG/50ML IV EMUL
INTRAVENOUS | Status: DC | PRN
  Administered 2014-12-07: 30 mg via INTRAVENOUS
  Administered 2014-12-07: 20 mg via INTRAVENOUS

## 2014-12-07 MED ORDER — LIDOCAINE HCL (CARDIAC) 20 MG/ML IV SOLN
INTRAVENOUS | Status: AC
  Filled 2014-12-07: qty 5

## 2014-12-07 MED ORDER — LACTATED RINGERS IV SOLN
INTRAVENOUS | Status: DC
  Administered 2014-12-07: 1000 mL via INTRAVENOUS

## 2014-12-07 SURGICAL SUPPLY — 15 items

## 2014-12-07 NOTE — Discharge Instructions (Signed)
YOU HAD AN ENDOSCOPIC PROCEDURE TODAY: Refer to the procedure report that was given to you for any specific questions about what was found during the examination.  If the procedure report does not answer your questions, please call your gastroenterologist to clarify.  YOU SHOULD EXPECT: Some feelings of bloating in the abdomen. Passage of more gas than usual.  Walking can help get rid of the air that was put into your GI tract during the procedure and reduce the bloating. If you had a lower endoscopy (such as a colonoscopy or flexible sigmoidoscopy) you may notice spotting of blood in your stool or on the toilet paper.   DIET: Your first meal following the procedure should be a light meal and then it is ok to progress to your normal diet.  A half-sandwich or bowl of soup is an example of a good first meal.  Heavy or fried foods are harder to digest and may make you feel nasueas or bloated.  Drink plenty of fluids but you should avoid alcoholic beverages for 24 hours.  ACTIVITY: Your care partner should take you home directly after the procedure.  You should plan to take it easy, moving slowly for the rest of the day.  You can resume normal activity the day after the procedure however you should NOT DRIVE or use heavy machinery for 24 hours (because of the sedation medicines used during the test).    SYMPTOMS TO REPORT IMMEDIATELY  A gastroenterologist can be reached at any hour.  Please call your doctor's office for any of the following symptoms:   Following lower endoscopy (colonoscopy, flexible sigmoidoscopy)  Excessive amounts of blood in the stool  Significant tenderness, worsening of abdominal pains  Swelling of the abdomen that is new, acute  Fever of 100 or higher  Following upper endoscopy (EGD, EUS, ERCP)  Vomiting of blood or coffee ground material  New, significant abdominal pain  New, significant chest pain or pain under the shoulder blades  Painful or persistently difficult  swallowing  New shortness of breath  Black, tarry-looking stools  FOLLOW UP: If any biopsies were taken you will be contacted by phone or by letter within the next 1-3 weeks.  Call your gastroenterologist if you have not heard about the biopsies in 3 weeks.  Please also call your gastroenterologist's office with any specific questions about appointments or follow up tests. Esophagogastrectomy, Care After Refer to this sheet in the next few weeks. These instructions provide you with information on caring for yourself after your procedure. Your caregiver may also give you more specific instructions. Your treatment has been planned according to current medical practices, but problems sometimes occur. Call your caregiver if you have any problems or questions after your procedure. HOME CARE INSTRUCTIONS   Know how to care for your temporary feeding tube. You will be given instructions on how to use it before you leave the hospital. You will have one for about 2-3 weeks. The feeding tube will be removed when you are able to take an oral diet that is sufficient. A home health nurse may come to your home to supervise your use of the tube.  Take all medicines as directed.  Do not use over-the-counter medicines, vitamins, or herbal supplements without your caregiver's approval.  Do not drive while you are using narcotic pain medicines or medicines for nausea.  Follow the diet prescribed by your caregiver.  Follow your caregiver's recommendations for activity level, amount of weight you can lift, and time  frame for return to work. These recommendations will depend on the type of surgery you had and your general health.  Care for your surgical cuts (incisions) and bandages (dressings) as directed by your caregiver.  Check with your surgeon before you fly on a plane. Air travel may be allowed 1 week after surgery.  You may shower unless instructed otherwise. Do not scrub the incisions. Pat them dry.  The use of your arms overhead to wash your hair may cause fatigue, shortness of breath, or pain. You may need someone to help you.  No baths, hot tubs, or swimming until your caregiver approves.  Check with your surgeon before resuming sexual activity. Sexual activity may be restricted for 4-6 weeks after surgery to allow the incisions to heal adequately.  Keep all follow-up appointments as directed by your caregiver. SEEK MEDICAL CARE IF:   You have increased pain that does not improve with medicine.  You have trouble swallowing.  You have heartburn or indigestion.  You feel nauseous or vomit.  You cannot have a bowel movement (constipated) or have diarrhea.  You have a rash.  You have burning with urination, or need to urinate more frequently than usual.  You have a new cough. SEEK IMMEDIATE MEDICAL CARE IF:   You have a fever or persistent symptoms for more than 2-3 days.  You have a fever and your symptoms suddenly get worse.  You have chills.  You have chest pain.  You have shortness of breath.  Your dressing on your incision becomes soaked with blood.  Your incision becomes red, opens up, or has a creamy or bad smelling discharge.  You have a severe increase in pain.  You have pain, tenderness, or redness in your calf.   This information is not intended to replace advice given to you by your health care provider. Make sure you discuss any questions you have with your health care provider.   Document Released: 08/12/2011 Document Revised: 12/01/2012 Document Reviewed: 08/12/2011 Elsevier Interactive Patient Education [employer].

## 2014-12-07 NOTE — Telephone Encounter (Signed)
-----   Message from Milus Banister, MD sent at 12/07/2014  1:21 PM EDT ----- Glendell Docker, just completed stent removal and EUS.  Technical images with EUS processor so the exam was not idea but I saw enough to rule out tumors, stones (see below).  Lamarcus Spira,  She needs LFTS in 1 month.  Thanks     Endoscopic findings (with side viewing duodenoscope and radial echoendoscope): 1. Previously placed plastic biliary stent was protruding into the duodenal across a fairly normal looking major papilla. The stent was removed with snare.  EUS findings (this examination was limited due to technical difficulty with EUS processor, these issues could not be resolved during the case.  I was able to get adequate views of the head of pancreas and periampulla): 1. The duodenal wall, periampulla was normal appearing, without overt mass. 2. The head of pancreas contained shadowing calcification but no soft tissue mass, lesion. 3. CBD was non-dilated      ENDOSCOPIC IMPRESSION: The previously placed biliary stent was removed. The major papilla was normal appearing by endoscopic views. The CBD, major papilla, head of pancreas were normal appearing by EUS.   RECOMMENDATIONS: Repeat LFTS in 4 weeks at [place].  If normal then would simply follow her clinically.

## 2014-12-07 NOTE — Transfer of Care (Signed)
Immediate Anesthesia Transfer of Care Note  Patient: Katelyn [name]  Procedure(s) Performed: Procedure(s) with comments: UPPER ENDOSCOPIC ULTRASOUND (EUS) LINEAR (N/A) ESOPHAGOGASTRODUODENOSCOPY (EGD) WITH PROPOFOL (N/A) GASTROINTESTINAL STENT REMOVAL (N/A) - pancreatic stent removal  Patient Location: PACU  Anesthesia Type:MAC  Level of Consciousness: awake, alert  and oriented  Airway & Oxygen Therapy: Patient Spontanous Breathing and Patient connected to nasal cannula oxygen  Post-op Assessment: Report given to RN and Post -op Vital signs reviewed and stable  Post vital signs: Reviewed and stable  Last Vitals:  Filed Vitals:   12/07/14 1119  BP: 181/93  Pulse: 78  Temp: 36.7 C  Resp: 16    Complications: No apparent anesthesia complications

## 2014-12-07 NOTE — Anesthesia Preprocedure Evaluation (Addendum)
Anesthesia Evaluation  Patient identified by MRN, date of birth, ID band Patient awake    Reviewed: Allergy & Precautions, NPO status , Patient's Chart, lab work & pertinent test results  Airway Mallampati: II  TM Distance: >3 FB Neck ROM: Full    Dental no notable dental hx.    Pulmonary shortness of breath, former smoker,    Pulmonary exam normal breath sounds clear to auscultation       Cardiovascular hypertension, Pt. on medications and Pt. on home beta blockers + Peripheral Vascular Disease  Normal cardiovascular exam Rhythm:Regular Rate:Normal  Study Conclusions  - Left ventricle: The cavity size was normal. Wall thickness was normal. Systolic function was at the lower limits of normal. The estimated ejection fraction was in the range of 50% to 55%. Mild diffuse hypokinesis with no identifiable regional variations. Doppler parameters are consistent with abnormal left ventricular relaxation (grade 1 diastolic dysfunction). - Aortic valve: There was mild stenosis. There was moderate regurgitation directed centrally in the LVOT. Valve area (VTI): 1.43 cm^2. Valve area (Vmax): 1.12 cm^2. Valve area (Vmean): 1.24 cm^2.    Neuro/Psych  Neuromuscular disease CVA negative psych ROS   GI/Hepatic negative GI ROS, Neg liver ROS,   Endo/Other  negative endocrine ROS  Renal/GU Renal disease  negative genitourinary   Musculoskeletal negative musculoskeletal ROS (+)   Abdominal   Peds negative pediatric ROS (+)  Hematology negative hematology ROS (+) anemia ,   Anesthesia Other Findings   Reproductive/Obstetrics negative OB ROS                           Anesthesia Physical Anesthesia Plan  ASA: III  Anesthesia Plan: MAC   Post-op Pain Management:    Induction: Intravenous  Airway Management Planned: Natural Airway  Additional Equipment:   Intra-op Plan:    Post-operative Plan:   Informed Consent: I have reviewed the patients History and Physical, chart, labs and discussed the procedure including the risks, benefits and alternatives for the proposed anesthesia with the patient or authorized representative who has indicated his/her understanding and acceptance.   Dental advisory given  Plan Discussed with: CRNA  Anesthesia Plan Comments:         Anesthesia Quick Evaluation

## 2014-12-07 NOTE — Anesthesia Postprocedure Evaluation (Signed)
  Anesthesia Post-op Note  Patient: Katelyn [name]  Procedure(s) Performed: Procedure(s) (LRB): UPPER ENDOSCOPIC ULTRASOUND (EUS) LINEAR (N/A) ESOPHAGOGASTRODUODENOSCOPY (EGD) WITH PROPOFOL (N/A) GASTROINTESTINAL STENT REMOVAL (N/A)  Patient Location: PACU  Anesthesia Type: MAC  Level of Consciousness: awake and alert   Airway and Oxygen Therapy: Patient Spontanous Breathing  Post-op Pain: mild  Post-op Assessment: Post-op Vital signs reviewed, Patient's Cardiovascular Status Stable, Respiratory Function Stable, Patent Airway and No signs of Nausea or vomiting  Last Vitals:  Filed Vitals:   12/07/14 1340  BP: 164/92  Pulse: 72  Temp:   Resp: 13    Post-op Vital Signs: stable   Complications: No apparent anesthesia complications

## 2014-12-07 NOTE — Op Note (Signed)
[facility] [place], [address]   ENDOSCOPIC ULTRASOUND PROCEDURE REPORT  PATIENT: Katelyn [name], Katelyn [name]  MR#: [id number] BIRTHDATE: 12-24-1950  GENDER: female ENDOSCOPIST: Milus Banister, MD REFERRED BY:  Gatha Mayer, M.D, [facility] PROCEDURE DATE:  12/07/2014 PROCEDURE:   Upper EUS ASA CLASS:      Class III INDICATIONS:   abnormal periampulla on CT/MRI 2015 followed by ERCP, stenting Dr.  Carlean Purl. MEDICATIONS: Monitored anesthesia care  DESCRIPTION OF PROCEDURE:   After the risks benefits and alternatives of the procedure were  explained, informed consent was obtained. The patient was then placed in the left, lateral, decubitus postion and IV sedation was administered. Throughout the procedure, the patients blood pressure, pulse and oxygen saturations were monitored continuously.  Under direct visualization, the PENTAX EUS SCOPE  endoscope was introduced through the mouth  and advanced to the second portion of the duodenum .  Water was used as necessary to provide an acoustic interface.  Upon completion of the imaging, water was removed and the patient was sent to the recovery room in satisfactory condition.  Endoscopic findings (with side viewing duodenoscope and radial echoendoscope): 1. Previously placed plastic biliary stent was protruding into the duodenal across a fairly normal looking major papilla. The stent was removed with snare.  EUS findings (this examination was limited due to technical difficulty with EUS processor, these issues could not be resolved during the case.  I was able to get adequate views of the head of pancreas and periampulla): 1. The duodenal wall, periampulla was normal appearing, without overt mass. 2. The head of pancreas contained shadowing calcification but no soft tissue mass, lesion. 3. CBD was non-dilated  ENDOSCOPIC IMPRESSION: The previously placed biliary stent was removed. The major papilla was  normal appearing by endoscopic views. The CBD, major papilla, head of pancreas were normal appearing by EUS.  RECOMMENDATIONS: Repeat LFTS in 4 weeks at [place].  If normal then would simply follow her clinically.  _______________________________ eSignedMilus Banister, MD 12/07/2014 1:20 PM

## 2014-12-07 NOTE — Interval H&P Note (Signed)
History and Physical Interval Note:  12/07/2014 11:37 AM  Katelyn [name]  has presented today for surgery, with the diagnosis of abnormal ct scan  The various methods of treatment have been discussed with the patient and family. After consideration of risks, benefits and other options for treatment, the patient has consented to  Procedure(s) with comments: UPPER ENDOSCOPIC ULTRASOUND (EUS) LINEAR (N/A) ESOPHAGOGASTRODUODENOSCOPY (EGD) WITH PROPOFOL (N/A) GASTROINTESTINAL STENT REMOVAL (N/A) - pancreatic stent removal as a surgical intervention .  The patient's history has been reviewed, patient examined, no change in status, stable for surgery.  I have reviewed the patient's chart and labs.  Questions were answered to the patient's satisfaction.     Milus Banister

## 2014-12-07 NOTE — H&P (View-Only) (Signed)
Subjective:    Patient ID: Katelyn [name], female    DOB: Dec 07, 1950, 64 y.o.   MRN: [id number] If complaint: Follow-up of abnormal pancreas on CT and indwelling biliary stent HPI Patient returns for the above complaint. Last year she had a possible abnormality in the pancreas or the duodenal wall. She had pancreatitis. There were variable interpretations of different study suggesting possible chronic pancreatitis. It was thought perhaps she had biliary pancreatitis. There is some stenosis and edematous changes in the duodenal wall at ERCP, I placed the stent and then replace this stent while were waiting for her to be able to hold Xarelto which was started for DVT. She was unable to make an appointment this summer for follow-up EUS with stent removal and presents today ready to do so. She denies any complaints, she is not having diarrhea or abdominal pain or signs of jaundice. There is a remote history of alcohol use many years ago but not currently.  No Known Allergies Outpatient Prescriptions Prior to Visit  Medication Sig Dispense Refill  . carvedilol (COREG) 12.5 MG tablet Take 1 tablet (12.5 mg total) by mouth 2 (two) times daily with a meal. 60 tablet 1  . ferrous sulfate 325 (65 FE) MG tablet Take 325 mg by mouth 2 (two) times daily.    . furosemide (LASIX) 20 MG tablet Take 1 tablet (20 mg total) by mouth daily. 30 tablet 1  . Multiple Vitamin (MULITIVITAMIN WITH MINERALS) TABS Take 1 tablet by mouth daily.    . potassium chloride 20 MEQ TBCR Take 20 mEq by mouth daily. 30 tablet 1  . pravastatin (PRAVACHOL) 40 MG tablet Take 40 mg by mouth daily.    . rivaroxaban (XARELTO) 20 MG TABS tablet Take 20 mg by mouth daily with supper.     No facility-administered medications prior to visit.   Past Medical History  Diagnosis Date  . Hypertension   . Hypercholesteremia   . Shortness of breath   . Blood transfusion     11'15 last admission  . Stroke [facility])     '13- right side  weakness, ambulates with cane  . Anemia   . Right leg DVT ([place]) 05/08/2014  . Acute gallstone pancreatitis 01/08/2014  . Septic shock - Kelbsiella 01/09/2014  . AKI (acute kidney injury) ([place]) 01/08/2014  . Cholecystitis    Past Surgical History  Procedure Laterality Date  . Abdominal hysterectomy    . Tee without cardioversion  03/06/2011    Procedure: TRANSESOPHAGEAL ECHOCARDIOGRAM (TEE);  Surgeon: Jolaine Artist, MD;  Location: [facility] ENDOSCOPY;  Service: Cardiovascular;  Laterality: N/A;  . Endoscopic retrograde cholangiopancreatography (ercp) with propofol N/A 01/11/2014    Procedure: ENDOSCOPIC RETROGRADE CHOLANGIOPANCREATOGRAPHY (ERCP) WITH PROPOFOL;  Surgeon: Gatha Mayer, MD;  Location: [facility];  Service: Endoscopy;  Laterality: N/A;  . Ercp    . Ercp N/A 05/26/2014    Procedure: ENDOSCOPIC RETROGRADE CHOLANGIOPANCREATOGRAPHY (ERCP);  Surgeon: Gatha Mayer, MD;  Location: [facility] ENDOSCOPY;  Service: Endoscopy;  Laterality: N/A;   Social History   Social History  . Marital Status: Widowed    Spouse Name: N/A  . Number of Children: N/A  . Years of Education: N/A   Occupational History  . Retired    Social History Main Topics  . Smoking status: Former Smoker -- 0.25 packs/day    Types: Cigarettes    Quit date: 02/15/2013  . Smokeless tobacco: Never Used  . Alcohol Use: No  . Drug Use: No  .  Sexual Activity: Not Asked   Other Topics Concern  . None   Social History Narrative   Family History  Problem Relation Age of Onset  . Anesthesia problems Neg Hx   . Hypotension Neg Hx   . Malignant hyperthermia Neg Hx   . Pseudochol deficiency Neg Hx   . Breast cancer Maternal Aunt   . Diabetes Mellitus I Father   . Diabetes Mellitus I Sister   . Colon cancer Neg Hx   . Esophageal cancer Neg Hx   . Pancreatic cancer Neg Hx   . Kidney disease Neg Hx   . Liver disease Neg Hx        Review of Systems As above    Objective:   Physical Exam @BP  150/90 mmHg   Pulse 76  Ht 4\' 10"  (1.473 m)  Wt 122 lb 3.2 oz (55.43 kg)  BMI 25.55 kg/m2@  General:  NAD Eyes:   anicteric Lungs:  clear Heart:: S1S2 no rubs, murmurs or gallops Abdomen:  soft and nontender, BS+ Ext:   no edema, cyanosis or clubbing    Data Reviewed:  Previous ERCPs and imaging.     Assessment & Plan:   1. Abnormal CT scan, pancreas or bile duct   2. Acute deep vein thrombosis (DVT) of right lower extremity, unspecified vein (HCC)   3. Long term current use of anticoagulant Xarelto    She has been scheduled for endoscopic ultrasound and stent removal next week by Dr. Ardis Hughs. My sense is that this was probably some sort of acute swelling and edema but on one of the CT scans are a suggestion of calcification in or near the head of the pancreas. Raised question of a mass.  I had previously contacted Dr. Radene Ou this summer and we both ought was reasonable to hold her Xarelto for 2 days given that her DVT was diagnosed in January and it's been over 6 months that she's been on treatment. He will hold that for 2 days prior to the procedure.  The risks and benefits as well as alternatives of endoscopic procedure(s) have been discussed and reviewed. All questions answered. The patient agrees to proceed.   CC: Milagros Evener, MD

## 2014-12-08 ENCOUNTER — Encounter (HOSPITAL_COMMUNITY): Payer: Self-pay | Admitting: Gastroenterology

## 2014-12-14 ENCOUNTER — Telehealth: Payer: Self-pay

## 2014-12-14 NOTE — Telephone Encounter (Signed)
-----   Message from Barron Alvine, [place] sent at 12/07/2014  2:24 PM EDT ----- Pt to get labs see 12/07/14 EUS

## 2014-12-19 NOTE — Telephone Encounter (Signed)
Letter mailed to remind pt of labs 

## 2016-10-28 DIAGNOSIS — E7800 Pure hypercholesterolemia, unspecified: Secondary | ICD-10-CM | POA: Diagnosis not present

## 2016-10-28 DIAGNOSIS — I10 Essential (primary) hypertension: Secondary | ICD-10-CM | POA: Diagnosis not present

## 2016-10-28 DIAGNOSIS — I6930 Unspecified sequelae of cerebral infarction: Secondary | ICD-10-CM | POA: Diagnosis not present

## 2017-05-29 ENCOUNTER — Emergency Department (HOSPITAL_COMMUNITY): Payer: Medicare HMO

## 2017-05-29 ENCOUNTER — Inpatient Hospital Stay (HOSPITAL_COMMUNITY): Payer: Medicare HMO | Admitting: Certified Registered Nurse Anesthetist

## 2017-05-29 ENCOUNTER — Encounter (HOSPITAL_COMMUNITY)
Admission: EM | Disposition: A | Discharge status: Skilled Nursing Facility | Payer: Self-pay | Source: Home / Self Care | Attending: Internal Medicine

## 2017-05-29 ENCOUNTER — Inpatient Hospital Stay (HOSPITAL_COMMUNITY): Payer: Medicare HMO

## 2017-05-29 ENCOUNTER — Other Ambulatory Visit: Payer: Self-pay

## 2017-05-29 ENCOUNTER — Inpatient Hospital Stay (HOSPITAL_COMMUNITY)
Admission: EM | Admit: 2017-05-29 | Discharge: 2017-06-11 | DRG: 003 | Disposition: A | Discharge status: Skilled Nursing Facility | Payer: Medicare HMO | Attending: Family Medicine | Admitting: Family Medicine

## 2017-05-29 ENCOUNTER — Encounter (HOSPITAL_COMMUNITY): Payer: Self-pay

## 2017-05-29 DIAGNOSIS — N183 Chronic kidney disease, stage 3 unspecified: Secondary | ICD-10-CM | POA: Diagnosis present

## 2017-05-29 DIAGNOSIS — D0008 Carcinoma in situ of pharynx: Secondary | ICD-10-CM | POA: Diagnosis not present

## 2017-05-29 DIAGNOSIS — E7800 Pure hypercholesterolemia, unspecified: Secondary | ICD-10-CM | POA: Diagnosis present

## 2017-05-29 DIAGNOSIS — W19XXXA Unspecified fall, initial encounter: Secondary | ICD-10-CM | POA: Diagnosis present

## 2017-05-29 DIAGNOSIS — J9690 Respiratory failure, unspecified, unspecified whether with hypoxia or hypercapnia: Secondary | ICD-10-CM | POA: Diagnosis not present

## 2017-05-29 DIAGNOSIS — I10 Essential (primary) hypertension: Secondary | ICD-10-CM | POA: Diagnosis not present

## 2017-05-29 DIAGNOSIS — Z79899 Other long term (current) drug therapy: Secondary | ICD-10-CM | POA: Diagnosis not present

## 2017-05-29 DIAGNOSIS — I5189 Other ill-defined heart diseases: Secondary | ICD-10-CM

## 2017-05-29 DIAGNOSIS — Z87891 Personal history of nicotine dependence: Secondary | ICD-10-CM

## 2017-05-29 DIAGNOSIS — I5032 Chronic diastolic (congestive) heart failure: Secondary | ICD-10-CM | POA: Diagnosis not present

## 2017-05-29 DIAGNOSIS — R000 Tachycardia, unspecified: Secondary | ICD-10-CM | POA: Diagnosis not present

## 2017-05-29 DIAGNOSIS — Z4682 Encounter for fitting and adjustment of non-vascular catheter: Secondary | ICD-10-CM | POA: Diagnosis not present

## 2017-05-29 DIAGNOSIS — M25551 Pain in right hip: Secondary | ICD-10-CM | POA: Diagnosis not present

## 2017-05-29 DIAGNOSIS — R079 Chest pain, unspecified: Secondary | ICD-10-CM | POA: Diagnosis not present

## 2017-05-29 DIAGNOSIS — Z419 Encounter for procedure for purposes other than remedying health state, unspecified: Secondary | ICD-10-CM | POA: Diagnosis not present

## 2017-05-29 DIAGNOSIS — Z9119 Patient's noncompliance with other medical treatment and regimen: Secondary | ICD-10-CM

## 2017-05-29 DIAGNOSIS — T884XXA Failed or difficult intubation, initial encounter: Secondary | ICD-10-CM | POA: Diagnosis present

## 2017-05-29 DIAGNOSIS — D649 Anemia, unspecified: Secondary | ICD-10-CM | POA: Diagnosis present

## 2017-05-29 DIAGNOSIS — S72001S Fracture of unspecified part of neck of right femur, sequela: Secondary | ICD-10-CM | POA: Diagnosis not present

## 2017-05-29 DIAGNOSIS — E440 Moderate protein-calorie malnutrition: Secondary | ICD-10-CM

## 2017-05-29 DIAGNOSIS — C321 Malignant neoplasm of supraglottis: Secondary | ICD-10-CM | POA: Diagnosis not present

## 2017-05-29 DIAGNOSIS — R270 Ataxia, unspecified: Secondary | ICD-10-CM | POA: Diagnosis not present

## 2017-05-29 DIAGNOSIS — K08109 Complete loss of teeth, unspecified cause, unspecified class: Secondary | ICD-10-CM | POA: Diagnosis not present

## 2017-05-29 DIAGNOSIS — S72141A Displaced intertrochanteric fracture of right femur, initial encounter for closed fracture: Principal | ICD-10-CM | POA: Diagnosis present

## 2017-05-29 DIAGNOSIS — R4702 Dysphasia: Secondary | ICD-10-CM | POA: Diagnosis not present

## 2017-05-29 DIAGNOSIS — M25559 Pain in unspecified hip: Secondary | ICD-10-CM

## 2017-05-29 DIAGNOSIS — R0603 Acute respiratory distress: Secondary | ICD-10-CM

## 2017-05-29 DIAGNOSIS — D380 Neoplasm of uncertain behavior of larynx: Secondary | ICD-10-CM | POA: Diagnosis not present

## 2017-05-29 DIAGNOSIS — R0602 Shortness of breath: Secondary | ICD-10-CM | POA: Diagnosis not present

## 2017-05-29 DIAGNOSIS — R1310 Dysphagia, unspecified: Secondary | ICD-10-CM

## 2017-05-29 DIAGNOSIS — M84451A Pathological fracture, right femur, initial encounter for fracture: Secondary | ICD-10-CM | POA: Diagnosis not present

## 2017-05-29 DIAGNOSIS — M6281 Muscle weakness (generalized): Secondary | ICD-10-CM | POA: Diagnosis not present

## 2017-05-29 DIAGNOSIS — T884XXD Failed or difficult intubation, subsequent encounter: Secondary | ICD-10-CM | POA: Diagnosis not present

## 2017-05-29 DIAGNOSIS — Z720 Tobacco use: Secondary | ICD-10-CM | POA: Diagnosis not present

## 2017-05-29 DIAGNOSIS — J9600 Acute respiratory failure, unspecified whether with hypoxia or hypercapnia: Secondary | ICD-10-CM | POA: Diagnosis not present

## 2017-05-29 DIAGNOSIS — Z8673 Personal history of transient ischemic attack (TIA), and cerebral infarction without residual deficits: Secondary | ICD-10-CM | POA: Diagnosis not present

## 2017-05-29 DIAGNOSIS — I69393 Ataxia following cerebral infarction: Secondary | ICD-10-CM | POA: Diagnosis not present

## 2017-05-29 DIAGNOSIS — I69351 Hemiplegia and hemiparesis following cerebral infarction affecting right dominant side: Secondary | ICD-10-CM

## 2017-05-29 DIAGNOSIS — S299XXA Unspecified injury of thorax, initial encounter: Secondary | ICD-10-CM | POA: Diagnosis not present

## 2017-05-29 DIAGNOSIS — J90 Pleural effusion, not elsewhere classified: Secondary | ICD-10-CM | POA: Diagnosis not present

## 2017-05-29 DIAGNOSIS — S72001A Fracture of unspecified part of neck of right femur, initial encounter for closed fracture: Secondary | ICD-10-CM

## 2017-05-29 DIAGNOSIS — D491 Neoplasm of unspecified behavior of respiratory system: Secondary | ICD-10-CM | POA: Diagnosis not present

## 2017-05-29 DIAGNOSIS — Z930 Tracheostomy status: Secondary | ICD-10-CM | POA: Diagnosis not present

## 2017-05-29 DIAGNOSIS — S72001D Fracture of unspecified part of neck of right femur, subsequent encounter for closed fracture with routine healing: Secondary | ICD-10-CM | POA: Diagnosis not present

## 2017-05-29 DIAGNOSIS — R0600 Dyspnea, unspecified: Secondary | ICD-10-CM

## 2017-05-29 DIAGNOSIS — S72143A Displaced intertrochanteric fracture of unspecified femur, initial encounter for closed fracture: Secondary | ICD-10-CM | POA: Diagnosis not present

## 2017-05-29 DIAGNOSIS — J387 Other diseases of larynx: Secondary | ICD-10-CM | POA: Diagnosis present

## 2017-05-29 DIAGNOSIS — R634 Abnormal weight loss: Secondary | ICD-10-CM | POA: Diagnosis not present

## 2017-05-29 DIAGNOSIS — Z4659 Encounter for fitting and adjustment of other gastrointestinal appliance and device: Secondary | ICD-10-CM

## 2017-05-29 DIAGNOSIS — Y92009 Unspecified place in unspecified non-institutional (private) residence as the place of occurrence of the external cause: Secondary | ICD-10-CM

## 2017-05-29 DIAGNOSIS — L8990 Pressure ulcer of unspecified site, unspecified stage: Secondary | ICD-10-CM

## 2017-05-29 DIAGNOSIS — I130 Hypertensive heart and chronic kidney disease with heart failure and stage 1 through stage 4 chronic kidney disease, or unspecified chronic kidney disease: Secondary | ICD-10-CM | POA: Diagnosis present

## 2017-05-29 DIAGNOSIS — J988 Other specified respiratory disorders: Secondary | ICD-10-CM | POA: Diagnosis not present

## 2017-05-29 DIAGNOSIS — I351 Nonrheumatic aortic (valve) insufficiency: Secondary | ICD-10-CM | POA: Diagnosis not present

## 2017-05-29 DIAGNOSIS — R1312 Dysphagia, oropharyngeal phase: Secondary | ICD-10-CM | POA: Diagnosis not present

## 2017-05-29 DIAGNOSIS — S72141D Displaced intertrochanteric fracture of right femur, subsequent encounter for closed fracture with routine healing: Secondary | ICD-10-CM | POA: Diagnosis not present

## 2017-05-29 DIAGNOSIS — I129 Hypertensive chronic kidney disease with stage 1 through stage 4 chronic kidney disease, or unspecified chronic kidney disease: Secondary | ICD-10-CM | POA: Diagnosis not present

## 2017-05-29 DIAGNOSIS — E7801 Familial hypercholesterolemia: Secondary | ICD-10-CM | POA: Diagnosis not present

## 2017-05-29 HISTORY — PX: PERCUTANEOUS TRACHEOSTOMY: SHX5288

## 2017-05-29 HISTORY — PX: INTRAMEDULLARY (IM) NAIL INTERTROCHANTERIC: SHX5875

## 2017-05-29 LAB — CBC WITH DIFFERENTIAL/PLATELET
BASOS ABS: 0 10*3/uL (ref 0.0–0.1)
BASOS PCT: 0 %
BASOS PCT: 0 %
Basophils Absolute: 0 10*3/uL (ref 0.0–0.1)
Eosinophils Absolute: 0 10*3/uL (ref 0.0–0.7)
Eosinophils Absolute: 0 10*3/uL (ref 0.0–0.7)
Eosinophils Relative: 0 %
Eosinophils Relative: 0 %
HEMATOCRIT: 28.9 % — AB (ref 36.0–46.0)
HEMATOCRIT: 25.4 % — AB (ref 36.0–46.0)
HEMOGLOBIN: 8.4 g/dL — AB (ref 12.0–15.0)
HEMOGLOBIN: 7.2 g/dL — AB (ref 12.0–15.0)
LYMPHS PCT: 10 %
LYMPHS PCT: 3 %
Lymphs Abs: 1.2 10*3/uL (ref 0.7–4.0)
Lymphs Abs: 0.6 10*3/uL — ABNORMAL LOW (ref 0.7–4.0)
MCH: 24.1 pg — ABNORMAL LOW (ref 26.0–34.0)
MCH: 24.1 pg — AB (ref 26.0–34.0)
MCHC: 29.1 g/dL — AB (ref 30.0–36.0)
MCHC: 28.3 g/dL — AB (ref 30.0–36.0)
MCV: 83 fL (ref 78.0–100.0)
MCV: 84.9 fL (ref 78.0–100.0)
MONO ABS: 0.9 10*3/uL (ref 0.1–1.0)
MONOS PCT: 7 %
MONOS PCT: 4 %
Monocytes Absolute: 0.7 10*3/uL (ref 0.1–1.0)
NEUTROS ABS: 9.7 10*3/uL — AB (ref 1.7–7.7)
NEUTROS ABS: 16.2 10*3/uL — AB (ref 1.7–7.7)
NEUTROS PCT: 83 %
NEUTROS PCT: 93 %
Platelets: 322 10*3/uL (ref 150–400)
Platelets: 273 10*3/uL (ref 150–400)
RBC: 3.48 MIL/uL — ABNORMAL LOW (ref 3.87–5.11)
RBC: 2.99 MIL/uL — ABNORMAL LOW (ref 3.87–5.11)
RDW: 16.5 % — AB (ref 11.5–15.5)
RDW: 16.3 % — ABNORMAL HIGH (ref 11.5–15.5)
WBC: 11.7 10*3/uL — ABNORMAL HIGH (ref 4.0–10.5)
WBC: 17.5 10*3/uL — ABNORMAL HIGH (ref 4.0–10.5)

## 2017-05-29 LAB — COMPREHENSIVE METABOLIC PANEL
ALBUMIN: 2.6 g/dL — AB (ref 3.5–5.0)
ALBUMIN: 2.3 g/dL — AB (ref 3.5–5.0)
ALT: 13 U/L — ABNORMAL LOW (ref 14–54)
ALT: 14 U/L (ref 14–54)
ANION GAP: 12 (ref 5–15)
AST: 19 U/L (ref 15–41)
AST: 30 U/L (ref 15–41)
Alkaline Phosphatase: 72 U/L (ref 38–126)
Alkaline Phosphatase: 66 U/L (ref 38–126)
Anion gap: 12 (ref 5–15)
BUN: 19 mg/dL (ref 6–20)
BUN: 20 mg/dL (ref 6–20)
CALCIUM: 9.6 mg/dL (ref 8.9–10.3)
CO2: 28 mmol/L (ref 22–32)
CO2: 28 mmol/L (ref 22–32)
Calcium: 9 mg/dL (ref 8.9–10.3)
Chloride: 98 mmol/L — ABNORMAL LOW (ref 101–111)
Chloride: 99 mmol/L — ABNORMAL LOW (ref 101–111)
Creatinine, Ser: 0.8 mg/dL (ref 0.44–1.00)
Creatinine, Ser: 0.92 mg/dL (ref 0.44–1.00)
GFR calc Af Amer: >60 mL/min (ref >60)
GFR calc non Af Amer: >60 mL/min (ref >60)
GFR calc non Af Amer: >60 mL/min (ref >60)
GLUCOSE: 105 mg/dL — AB (ref 65–99)
GLUCOSE: 145 mg/dL — AB (ref 65–99)
POTASSIUM: 3.7 mmol/L (ref 3.5–5.1)
POTASSIUM: 4.2 mmol/L (ref 3.5–5.1)
SODIUM: 138 mmol/L (ref 135–145)
Sodium: 139 mmol/L (ref 135–145)
Total Bilirubin: 0.8 mg/dL (ref 0.3–1.2)
Total Bilirubin: 0.5 mg/dL (ref 0.3–1.2)
Total Protein: 7.9 g/dL (ref 6.5–8.1)
Total Protein: 7.1 g/dL (ref 6.5–8.1)

## 2017-05-29 LAB — PROTIME-INR
INR: 1.14
INR: 1.15
PROTHROMBIN TIME: 14.6 s (ref 11.4–15.2)
Prothrombin Time: 14.6 seconds (ref 11.4–15.2)

## 2017-05-29 LAB — IRON AND TIBC
Iron: 12 ug/dL — ABNORMAL LOW (ref 28–170)
Saturation Ratios: 6 % — ABNORMAL LOW (ref 10.4–31.8)
TIBC: 210 ug/dL — AB (ref 250–450)
UIBC: 198 ug/dL

## 2017-05-29 LAB — RETICULOCYTES
RBC.: 3.22 MIL/uL — ABNORMAL LOW (ref 3.87–5.11)
RETIC CT PCT: 2 % (ref 0.4–3.1)
Retic Count, Absolute: 64.4 10*3/uL (ref 19.0–186.0)

## 2017-05-29 LAB — FOLATE: Folate: 3.6 ng/mL — ABNORMAL LOW (ref >5.9)

## 2017-05-29 LAB — POCT I-STAT 3, ART BLOOD GAS (G3+)
Acid-Base Excess: 3 mmol/L — ABNORMAL HIGH (ref 0.0–2.0)
Bicarbonate: 30.3 mmol/L — ABNORMAL HIGH (ref 20.0–28.0)
O2 SAT: 84 %
PCO2 ART: 61.8 mmHg — AB (ref 32.0–48.0)
PH ART: 7.294 — AB (ref 7.350–7.450)
PO2 ART: 54 mmHg — AB (ref 83.0–108.0)
Patient temperature: 97.4
TCO2: 32 mmol/L (ref 22–32)

## 2017-05-29 LAB — MRSA PCR SCREENING: MRSA BY PCR: NEGATIVE

## 2017-05-29 LAB — PHOSPHORUS: Phosphorus: 5 mg/dL — ABNORMAL HIGH (ref 2.5–4.6)

## 2017-05-29 LAB — FERRITIN: FERRITIN: 410 ng/mL — AB (ref 11–307)

## 2017-05-29 LAB — PREPARE RBC (CROSSMATCH)

## 2017-05-29 LAB — MAGNESIUM: Magnesium: 1.9 mg/dL (ref 1.7–2.4)

## 2017-05-29 LAB — VITAMIN B12: Vitamin B-12: 824 pg/mL (ref 180–914)

## 2017-05-29 SURGERY — FIXATION, FRACTURE, INTERTROCHANTERIC, WITH INTRAMEDULLARY ROD
Anesthesia: Spinal | Site: Hip | Laterality: Right

## 2017-05-29 SURGERY — CREATION, TRACHEOSTOMY, PERCUTANEOUS
Anesthesia: LOCAL

## 2017-05-29 MED ORDER — CEFAZOLIN SODIUM-DEXTROSE 2-4 GM/100ML-% IV SOLN
2.0000 g | INTRAVENOUS | Status: AC
  Administered 2017-05-29: 2 g via INTRAVENOUS
  Filled 2017-05-29: qty 100

## 2017-05-29 MED ORDER — METHOCARBAMOL 1000 MG/10ML IJ SOLN
500.0000 mg | Freq: Four times a day (QID) | INTRAVENOUS | Status: DC | PRN
  Filled 2017-05-29: qty 5

## 2017-05-29 MED ORDER — ONDANSETRON HCL 4 MG PO TABS: 4.0000 mg | ORAL_TABLET | Freq: Four times a day (QID) | ORAL | Status: DC | PRN

## 2017-05-29 MED ORDER — DOCUSATE SODIUM 100 MG PO CAPS: 100.0000 mg | ORAL_CAPSULE | Freq: Two times a day (BID) | ORAL | Status: DC

## 2017-05-29 MED ORDER — ACETAMINOPHEN 650 MG RE SUPP: 650.0000 mg | Freq: Four times a day (QID) | RECTAL | Status: DC | PRN

## 2017-05-29 MED ORDER — TRAMADOL HCL 50 MG PO TABS: 50.0000 mg | ORAL_TABLET | Freq: Four times a day (QID) | ORAL | Status: DC

## 2017-05-29 MED ORDER — ACETAMINOPHEN 325 MG PO TABS: 325.0000 mg | ORAL_TABLET | Freq: Four times a day (QID) | ORAL | Status: DC | PRN

## 2017-05-29 MED ORDER — FENTANYL CITRATE (PF) 100 MCG/2ML IJ SOLN
50.0000 ug | INTRAMUSCULAR | Status: DC | PRN
  Administered 2017-05-29: 50 ug via INTRAVENOUS
  Filled 2017-05-29 (×14): qty 2

## 2017-05-29 MED ORDER — 0.9 % SODIUM CHLORIDE (POUR BTL) OPTIME
TOPICAL | Status: DC | PRN
Start: 2017-05-29 — End: 2017-05-29
  Administered 2017-05-29: 1000 mL

## 2017-05-29 MED ORDER — HYDROCODONE-ACETAMINOPHEN 7.5-325 MG PO TABS
1.0000 | ORAL_TABLET | ORAL | Status: DC | PRN
Start: 2017-05-29 — End: 2017-05-29

## 2017-05-29 MED ORDER — METOCLOPRAMIDE HCL 5 MG/ML IJ SOLN
5.0000 mg | Freq: Three times a day (TID) | INTRAMUSCULAR | Status: DC | PRN
Start: 2017-05-29 — End: 2017-05-29

## 2017-05-29 MED ORDER — HYDROMORPHONE HCL 1 MG/ML IJ SOLN
0.5000 mg | Freq: Once | INTRAMUSCULAR | Status: AC
  Administered 2017-05-29: 0.5 mg via INTRAVENOUS
  Filled 2017-05-29: qty 1

## 2017-05-29 MED ORDER — MORPHINE SULFATE (PF) 4 MG/ML IV SOLN: 0.5000 mg | INTRAVENOUS | Status: DC | PRN

## 2017-05-29 MED ORDER — PROPOFOL 10 MG/ML IV BOLUS
INTRAVENOUS | Status: AC
  Filled 2017-05-29: qty 20

## 2017-05-29 MED ORDER — MIDAZOLAM HCL 2 MG/2ML IJ SOLN
INTRAMUSCULAR | Status: AC
Start: 2017-05-29 — End: ?
  Filled 2017-05-29: qty 2

## 2017-05-29 MED ORDER — FENTANYL CITRATE (PF) 100 MCG/2ML IJ SOLN
50.0000 ug | INTRAMUSCULAR | Status: DC | PRN
  Administered 2017-05-29: 25 ug via INTRAVENOUS
  Administered 2017-05-30 (×5): 50 ug via INTRAVENOUS
  Filled 2017-05-29 (×4): qty 2

## 2017-05-29 MED ORDER — ACETAMINOPHEN 160 MG/5ML PO SOLN
650.0000 mg | Freq: Four times a day (QID) | ORAL | Status: DC | PRN
  Administered 2017-06-07: 650 mg via ORAL
  Filled 2017-05-29: qty 20.3

## 2017-05-29 MED ORDER — ONDANSETRON HCL 4 MG/2ML IJ SOLN
INTRAMUSCULAR | Status: AC
  Filled 2017-05-29: qty 2

## 2017-05-29 MED ORDER — HYDROCODONE-ACETAMINOPHEN 5-325 MG PO TABS
1.0000 | ORAL_TABLET | ORAL | Status: DC | PRN
  Administered 2017-06-01 – 2017-06-04 (×12): 2 via ORAL
  Administered 2017-06-04 (×2): 1 via ORAL
  Administered 2017-06-05: 2 via ORAL
  Administered 2017-06-05: 1 via ORAL
  Administered 2017-06-05 – 2017-06-07 (×5): 2 via ORAL
  Administered 2017-06-08: 1 via ORAL
  Administered 2017-06-08 – 2017-06-09 (×2): 2 via ORAL
  Administered 2017-06-09: 1 via ORAL
  Administered 2017-06-09: 2 via ORAL
  Administered 2017-06-10: 1 via ORAL
  Administered 2017-06-10: 2 via ORAL
  Administered 2017-06-10 – 2017-06-11 (×3): 1 via ORAL
  Filled 2017-05-29: qty 1
  Filled 2017-05-29 (×7): qty 2
  Filled 2017-05-29: qty 1
  Filled 2017-05-29 (×4): qty 2
  Filled 2017-05-29: qty 1
  Filled 2017-05-29 (×4): qty 2
  Filled 2017-05-29 (×2): qty 1
  Filled 2017-05-29 (×2): qty 2
  Filled 2017-05-29: qty 1
  Filled 2017-05-29 (×2): qty 2
  Filled 2017-05-29: qty 1
  Filled 2017-05-29 (×3): qty 2
  Filled 2017-05-29 (×2): qty 1
  Filled 2017-05-29: qty 2

## 2017-05-29 MED ORDER — ESMOLOL HCL 100 MG/10ML IV SOLN
INTRAVENOUS | Status: DC | PRN
  Administered 2017-05-29 (×3): 20 mg via INTRAVENOUS

## 2017-05-29 MED ORDER — LACTATED RINGERS IV SOLN
INTRAVENOUS | Status: DC | PRN
  Administered 2017-05-29: 18:00:00 via INTRAVENOUS

## 2017-05-29 MED ORDER — ONDANSETRON HCL 4 MG/2ML IJ SOLN: 4.0000 mg | Freq: Four times a day (QID) | INTRAMUSCULAR | Status: DC | PRN

## 2017-05-29 MED ORDER — HEPARIN SODIUM (PORCINE) 5000 UNIT/ML IJ SOLN
5000.0000 [IU] | Freq: Three times a day (TID) | INTRAMUSCULAR | Status: DC
  Administered 2017-05-30 – 2017-06-11 (×35): 5000 [IU] via SUBCUTANEOUS
  Filled 2017-05-29 (×34): qty 1

## 2017-05-29 MED ORDER — HYDRALAZINE HCL 20 MG/ML IJ SOLN: 5.0000 mg | INTRAMUSCULAR | Status: DC | PRN

## 2017-05-29 MED ORDER — FENTANYL CITRATE (PF) 250 MCG/5ML IJ SOLN
INTRAMUSCULAR | Status: DC | PRN
  Administered 2017-05-29: 25 ug via INTRAVENOUS

## 2017-05-29 MED ORDER — MORPHINE SULFATE (PF) 4 MG/ML IV SOLN
0.5000 mg | INTRAVENOUS | Status: DC | PRN
Start: 2017-05-29 — End: 2017-05-29

## 2017-05-29 MED ORDER — METHOCARBAMOL 500 MG PO TABS: 500.0000 mg | ORAL_TABLET | Freq: Four times a day (QID) | ORAL | Status: DC | PRN

## 2017-05-29 MED ORDER — ALBUTEROL SULFATE (2.5 MG/3ML) 0.083% IN NEBU
2.5000 mg | INHALATION_SOLUTION | RESPIRATORY_TRACT | Status: DC | PRN
  Administered 2017-06-01: 2.5 mg via RESPIRATORY_TRACT
  Filled 2017-05-29: qty 3

## 2017-05-29 MED ORDER — MIDAZOLAM HCL 2 MG/2ML IJ SOLN: 1.0000 mg | INTRAMUSCULAR | Status: DC | PRN

## 2017-05-29 MED ORDER — KETAMINE HCL 10 MG/ML IJ SOLN
INTRAMUSCULAR | Status: AC
  Filled 2017-05-29: qty 1

## 2017-05-29 MED ORDER — DOCUSATE SODIUM 50 MG/5ML PO LIQD
100.0000 mg | Freq: Two times a day (BID) | ORAL | Status: DC
  Administered 2017-05-30 – 2017-06-06 (×10): 100 mg
  Filled 2017-05-29 (×12): qty 10

## 2017-05-29 MED ORDER — HYDROCODONE-ACETAMINOPHEN 5-325 MG PO TABS: 1.0000 | ORAL_TABLET | Freq: Four times a day (QID) | ORAL | Status: DC | PRN

## 2017-05-29 MED ORDER — SODIUM CHLORIDE 0.9 % IV SOLN: INTRAVENOUS | Status: DC

## 2017-05-29 MED ORDER — PROPOFOL 10 MG/ML IV BOLUS
INTRAVENOUS | Status: DC | PRN
  Administered 2017-05-29: 10 mg via INTRAVENOUS

## 2017-05-29 MED ORDER — FLEET ENEMA 7-19 GM/118ML RE ENEM: 1.0000 | ENEMA | Freq: Once | RECTAL | Status: DC | PRN

## 2017-05-29 MED ORDER — PHENYLEPHRINE 40 MCG/ML (10ML) SYRINGE FOR IV PUSH (FOR BLOOD PRESSURE SUPPORT)
PREFILLED_SYRINGE | INTRAVENOUS | Status: DC | PRN
  Administered 2017-05-29: 80 ug via INTRAVENOUS

## 2017-05-29 MED ORDER — ONDANSETRON HCL 4 MG/2ML IJ SOLN: INTRAMUSCULAR | Status: DC | PRN

## 2017-05-29 MED ORDER — POVIDONE-IODINE 10 % EX SWAB: 2.0000 "application " | Freq: Once | CUTANEOUS | Status: DC

## 2017-05-29 MED ORDER — ORAL CARE MOUTH RINSE
15.0000 mL | Freq: Four times a day (QID) | OROMUCOSAL | Status: DC
  Administered 2017-05-30 – 2017-06-01 (×10): 15 mL via OROMUCOSAL

## 2017-05-29 MED ORDER — ONDANSETRON HCL 4 MG/2ML IJ SOLN
INTRAMUSCULAR | Status: DC | PRN
  Administered 2017-05-29: 4 mg via INTRAVENOUS

## 2017-05-29 MED ORDER — CHLORHEXIDINE GLUCONATE 0.12% ORAL RINSE (MEDLINE KIT)
15.0000 mL | Freq: Two times a day (BID) | OROMUCOSAL | Status: DC
  Administered 2017-05-30 – 2017-06-06 (×8): 15 mL via OROMUCOSAL

## 2017-05-29 MED ORDER — SODIUM CHLORIDE 0.9 % IV SOLN: 250.0000 mL | INTRAVENOUS | Status: DC | PRN

## 2017-05-29 MED ORDER — PANTOPRAZOLE SODIUM 40 MG IV SOLR
40.0000 mg | Freq: Every day | INTRAVENOUS | Status: DC
  Administered 2017-05-30 – 2017-06-01 (×4): 40 mg via INTRAVENOUS
  Filled 2017-05-29 (×4): qty 40

## 2017-05-29 MED ORDER — ROCURONIUM BROMIDE 10 MG/ML (PF) SYRINGE
PREFILLED_SYRINGE | INTRAVENOUS | Status: AC
  Filled 2017-05-29: qty 5

## 2017-05-29 MED ORDER — ORAL CARE MOUTH RINSE: 15.0000 mL | Freq: Four times a day (QID) | OROMUCOSAL | Status: DC

## 2017-05-29 MED ORDER — CEFAZOLIN SODIUM-DEXTROSE 1-4 GM/50ML-% IV SOLN
1.0000 g | Freq: Four times a day (QID) | INTRAVENOUS | Status: AC
  Administered 2017-05-30 (×3): 1 g via INTRAVENOUS
  Filled 2017-05-29 (×3): qty 50

## 2017-05-29 MED ORDER — FENTANYL CITRATE (PF) 250 MCG/5ML IJ SOLN
INTRAMUSCULAR | Status: AC
  Filled 2017-05-29: qty 5

## 2017-05-29 MED ORDER — NALOXONE HCL 0.4 MG/ML IJ SOLN
INTRAMUSCULAR | Status: AC
  Administered 2017-05-29: 0.1 mg via INTRAVENOUS
  Filled 2017-05-29: qty 1

## 2017-05-29 MED ORDER — ACETAMINOPHEN 10 MG/ML IV SOLN
1000.0000 mg | Freq: Once | INTRAVENOUS | Status: AC
  Administered 2017-05-29: 1000 mg via INTRAVENOUS
  Filled 2017-05-29: qty 100

## 2017-05-29 MED ORDER — LACTATED RINGERS IV SOLN
INTRAVENOUS | Status: DC
  Administered 2017-05-29 – 2017-06-02 (×8): via INTRAVENOUS

## 2017-05-29 MED ORDER — DEXAMETHASONE SODIUM PHOSPHATE 10 MG/ML IJ SOLN
INTRAMUSCULAR | Status: AC
  Filled 2017-05-29: qty 2

## 2017-05-29 MED ORDER — NALOXONE HCL 0.4 MG/ML IJ SOLN
0.1000 mg | Freq: Once | INTRAMUSCULAR | Status: AC
  Administered 2017-05-29: 0.1 mg via INTRAVENOUS

## 2017-05-29 MED ORDER — CHLORHEXIDINE GLUCONATE 4 % EX LIQD: 60.0000 mL | Freq: Once | CUTANEOUS | Status: DC

## 2017-05-29 MED ORDER — BUPIVACAINE IN DEXTROSE 0.75-8.25 % IT SOLN
INTRATHECAL | Status: DC | PRN
  Administered 2017-05-29: 1.4 mL via INTRATHECAL

## 2017-05-29 MED ORDER — BUPIVACAINE HCL (PF) 0.5 % IJ SOLN
INTRAMUSCULAR | Status: AC
  Filled 2017-05-29: qty 30

## 2017-05-29 MED ORDER — METOCLOPRAMIDE HCL 5 MG PO TABS: 5.0000 mg | ORAL_TABLET | Freq: Three times a day (TID) | ORAL | Status: DC | PRN

## 2017-05-29 MED ORDER — SODIUM CHLORIDE 0.9 % IV BOLUS
500.0000 mL | Freq: Once | INTRAVENOUS | Status: AC
  Administered 2017-05-29: 500 mL via INTRAVENOUS

## 2017-05-29 MED ORDER — CHLORHEXIDINE GLUCONATE 0.12% ORAL RINSE (MEDLINE KIT)
15.0000 mL | Freq: Two times a day (BID) | OROMUCOSAL | Status: DC
  Administered 2017-05-30 – 2017-06-11 (×13): 15 mL via OROMUCOSAL
  Filled 2017-05-29: qty 15

## 2017-05-29 SURGICAL SUPPLY — 46 items
BIT DRILL FLUTED FEMUR 4.2/3 (BIT) ×6 IMPLANT
BLADE SURG 15 STRL LF DISP TIS (BLADE) ×1 IMPLANT
BLADE SURG 15 STRL SS (BLADE) ×3
BNDG COHESIVE 4X5 TAN NS LF (GAUZE/BANDAGES/DRESSINGS) ×1 IMPLANT
BNDG COHESIVE 6X5 TAN STRL LF (GAUZE/BANDAGES/DRESSINGS) ×3 IMPLANT
BNDG GAUZE ELAST 4 BULKY (GAUZE/BANDAGES/DRESSINGS) ×1 IMPLANT
COVER PERINEAL POST (MISCELLANEOUS) ×3 IMPLANT
COVER SURGICAL LIGHT HANDLE (MISCELLANEOUS) ×3 IMPLANT
DRAPE HALF SHEET 40X57 (DRAPES) IMPLANT
DRAPE STERI IOBAN 125X83 (DRAPES) ×3 IMPLANT
DRSG ADAPTIC 3X8 NADH LF (GAUZE/BANDAGES/DRESSINGS) ×2 IMPLANT
DRSG MEPILEX BORDER 4X4 (GAUZE/BANDAGES/DRESSINGS) IMPLANT
DRSG MEPILEX BORDER 4X8 (GAUZE/BANDAGES/DRESSINGS) ×1 IMPLANT
DRSG PAD ABDOMINAL 8X10 ST (GAUZE/BANDAGES/DRESSINGS) ×2 IMPLANT
DURAPREP 26ML APPLICATOR (WOUND CARE) ×3 IMPLANT
ELECT CAUTERY BLADE 6.4 (BLADE) ×3 IMPLANT
ELECT REM PT RETURN 9FT ADLT (ELECTROSURGICAL) ×3
ELECTRODE REM PT RTRN 9FT ADLT (ELECTROSURGICAL) ×1 IMPLANT
FACESHIELD WRAPAROUND (MASK) ×3 IMPLANT
GAUZE SPONGE 4X4 12PLY STRL (GAUZE/BANDAGES/DRESSINGS) ×3 IMPLANT
GAUZE XEROFORM 5X9 LF (GAUZE/BANDAGES/DRESSINGS) ×1 IMPLANT
GLOVE BIO SURGEON STRL SZ7.5 (GLOVE) ×3 IMPLANT
GLOVE BIOGEL PI IND STRL 8 (GLOVE) ×1 IMPLANT
GLOVE BIOGEL PI INDICATOR 8 (GLOVE) ×2
GOWN STRL REUS W/ TWL LRG LVL3 (GOWN DISPOSABLE) ×2 IMPLANT
GOWN STRL REUS W/ TWL XL LVL3 (GOWN DISPOSABLE) ×1 IMPLANT
GOWN STRL REUS W/TWL LRG LVL3 (GOWN DISPOSABLE) ×6
GOWN STRL REUS W/TWL XL LVL3 (GOWN DISPOSABLE) ×3
GUIDEWIRE 3.2X400 (WIRE) ×3 IMPLANT
KIT BASIN OR (CUSTOM PROCEDURE TRAY) ×3 IMPLANT
KIT TURNOVER KIT B (KITS) ×3 IMPLANT
LINER BOOT UNIVERSAL DISP (MISCELLANEOUS) ×3 IMPLANT
MANIFOLD NEPTUNE II (INSTRUMENTS) ×3 IMPLANT
NAIL TROCH FIX 10X235 RT 130 (Nail) ×3 IMPLANT
NS IRRIG 1000ML POUR BTL (IV SOLUTION) ×3 IMPLANT
PACK GENERAL/GYN (CUSTOM PROCEDURE TRAY) ×3 IMPLANT
PAD ARMBOARD 7.5X6 YLW CONV (MISCELLANEOUS) ×4 IMPLANT
PAD CAST 4YDX4 CTTN HI CHSV (CAST SUPPLIES) IMPLANT
PADDING CAST COTTON 4X4 STRL (CAST SUPPLIES)
SCREW LAG TFN 75 (Screw) ×2 IMPLANT
SCREW LOCKING 5.0X30 (Screw) ×3 IMPLANT
STAPLER VISISTAT 35W (STAPLE) ×3 IMPLANT
SUT MON AB 2-0 CT1 36 (SUTURE) ×3 IMPLANT
TOWEL OR 17X24 6PK STRL BLUE (TOWEL DISPOSABLE) IMPLANT
TOWEL OR 17X26 10 PK STRL BLUE (TOWEL DISPOSABLE) ×3 IMPLANT
WATER STERILE IRR 1000ML POUR (IV SOLUTION) IMPLANT

## 2017-05-29 SURGICAL SUPPLY — 28 items
DRAPE HALF SHEET 40X57 (DRAPES) ×4 IMPLANT
DRAPE UTILITY XL STRL (DRAPES) ×1 IMPLANT
ELECT CAUTERY BLADE 6.4 (BLADE) ×1 IMPLANT
ELECT REM PT RETURN 9FT ADLT (ELECTROSURGICAL)
ELECTRODE REM PT RTRN 9FT ADLT (ELECTROSURGICAL) IMPLANT
GAUZE SPONGE 4X4 16PLY XRAY LF (GAUZE/BANDAGES/DRESSINGS) ×2 IMPLANT
GLOVE BIO SURGEON STRL SZ 6 (GLOVE) IMPLANT
GLOVE BIO SURGEON STRL SZ8 (GLOVE) IMPLANT
GLOVE BIOGEL PI IND STRL 6.5 (GLOVE) IMPLANT
GLOVE BIOGEL PI IND STRL 8 (GLOVE) ×1 IMPLANT
GLOVE BIOGEL PI INDICATOR 6.5 (GLOVE)
GLOVE BIOGEL PI INDICATOR 8 (GLOVE)
GLOVE ECLIPSE 7.5 STRL STRAW (GLOVE) ×4 IMPLANT
GOWN STRL REUS W/ TWL LRG LVL3 (GOWN DISPOSABLE) IMPLANT
GOWN STRL REUS W/ TWL XL LVL3 (GOWN DISPOSABLE) IMPLANT
GOWN STRL REUS W/TWL LRG LVL3 (GOWN DISPOSABLE)
GOWN STRL REUS W/TWL XL LVL3 (GOWN DISPOSABLE)
INTRODUCER TRACH BLUE RHINO 6F (TUBING) ×1 IMPLANT
INTRODUCER TRACH BLUE RHINO 8F (TUBING) IMPLANT
NS IRRIG 1000ML POUR BTL (IV SOLUTION) ×2 IMPLANT
PENCIL BUTTON HOLSTER BLD 10FT (ELECTRODE) ×1 IMPLANT
SPONGE DRAIN TRACH 4X4 STRL 2S (GAUZE/BANDAGES/DRESSINGS) ×2 IMPLANT
SPONGE INTESTINAL PEANUT (DISPOSABLE) IMPLANT
SUT SILK 2 0 SH CR/8 (SUTURE) IMPLANT
SUT VICRYL AB 3 0 TIES (SUTURE) ×1 IMPLANT
TOWEL OR 17X24 6PK STRL BLUE (TOWEL DISPOSABLE) ×2 IMPLANT
TUBE CONNECTING 12X1/4 (SUCTIONS) ×1 IMPLANT
YANKAUER SUCT BULB TIP NO VENT (SUCTIONS) IMPLANT

## 2017-05-29 NOTE — ED Provider Notes (Signed)
[facility] EMERGENCY DEPARTMENT Provider Note   CSN: [id number] Arrival date & time: 05/29/17  1011   History   Chief Complaint Chief Complaint  Patient presents with  . Hip Pain    HPI Katelyn [name] is a 67 y.o. female who presents with right hip pain.  Past medical history significant for history of stroke with right-sided weakness, hypertension, hyperlipidemia, history of gallstone pancreatitis, epiglottis lesion, possible pancreatic mass, hx of DVT, tobacco use.  The patient lives with a friend.  She states she fell 2 days ago after falling out of a chair..  She has not been able to ambulate since then.  Her friend has been giving her food and she has been wearing Pampers to to go to the bathroom.  Patient's daughter, sister, granddaughters at bedside state that the patient does not take care of herself and does not go to the doctor.  They are also concerned about her breathing and her voice change since she has not followed up about the lesion in her esophagus.  She is also lost a significant amount of weight.  HPI  Past Medical History:  Diagnosis Date  . Acute gallstone pancreatitis 01/08/2014  . AKI (acute kidney injury) ([place]) 01/08/2014  . Anemia   . Blood transfusion    11'15 last admission  . Cholecystitis   . Hypercholesteremia   . Hypertension   . Right leg DVT ([place]) 05/08/2014  . Septic shock - Kelbsiella 01/09/2014  . Shortness of breath   . Stroke [facility])    '13- right side weakness, ambulates with cane    Patient Active Problem List   Diagnosis Date Noted  . Lesion of epiglottis 05/26/2014  . Abnormal CT scan, pancreas or bile duct   . Right leg DVT ([place]) 05/08/2014  . Anemia 01/09/2014  . Hip pain, right 05/06/2011  . 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 CKD (chronic kidney disease) stage 3, GFR 30-59 ml/min (HCC) 03/04/2011  . Diastolic dysfunction 40/09/6759  . Tobacco abuse 03/04/2011  . Constipation 03/04/2011  .  Malignant hypertensive urgency 03/01/2011  . CVA (cerebral infarction) 03/01/2011  . NEUROPATHY, IDIOPATHIC 10/25/2007  . Hypercholesteremia 12/15/2006  . Essential hypertension 12/15/2006    Past Surgical History:  Procedure Laterality Date  . ABDOMINAL HYSTERECTOMY    . ENDOSCOPIC RETROGRADE CHOLANGIOPANCREATOGRAPHY (ERCP) WITH PROPOFOL N/A 01/11/2014   Procedure: ENDOSCOPIC RETROGRADE CHOLANGIOPANCREATOGRAPHY (ERCP) WITH PROPOFOL;  Surgeon: Gatha Mayer, MD;  Location: [facility];  Service: Endoscopy;  Laterality: N/A;  . ERCP    . ERCP N/A 05/26/2014   Procedure: ENDOSCOPIC RETROGRADE CHOLANGIOPANCREATOGRAPHY (ERCP);  Surgeon: Gatha Mayer, MD;  Location: [facility];  Service: Endoscopy;  Laterality: N/A;  . ESOPHAGOGASTRODUODENOSCOPY (EGD) WITH PROPOFOL N/A 12/07/2014   Procedure: ESOPHAGOGASTRODUODENOSCOPY (EGD) WITH PROPOFOL;  Surgeon: Milus Banister, MD;  Location: [facility] ENDOSCOPY;  Service: Endoscopy;  Laterality: N/A;  . EUS N/A 12/07/2014   Procedure: UPPER ENDOSCOPIC ULTRASOUND (EUS) LINEAR;  Surgeon: Milus Banister, MD;  Location: [facility] ENDOSCOPY;  Service: Endoscopy;  Laterality: N/A;  . GASTROINTESTINAL STENT REMOVAL N/A 12/07/2014   Procedure: GASTROINTESTINAL STENT REMOVAL;  Surgeon: Milus Banister, MD;  Location: [facility] ENDOSCOPY;  Service: Endoscopy;  Laterality: N/A;  pancreatic stent removal  . TEE WITHOUT CARDIOVERSION  03/06/2011   Procedure: TRANSESOPHAGEAL ECHOCARDIOGRAM (TEE);  Surgeon: Jolaine Artist, MD;  Location: [facility] ENDOSCOPY;  Service: Cardiovascular;  Laterality: N/A;     OB History   None      Home  Medications    Prior to Admission medications   Medication Sig Start Date End Date Taking? Authorizing Provider  carvedilol (COREG) 12.5 MG tablet Take 1 tablet (12.5 mg total) by mouth 2 (two) times daily with a meal. 01/14/14   Barton Dubois, MD  ferrous sulfate 325 (65 FE) MG tablet Take 325 mg by mouth 2 (two) times daily.    [provider]  furosemide (LASIX) 20 MG tablet Take 1 tablet (20 mg total) by mouth daily. 01/14/14   Barton Dubois, MD  Multiple Vitamin (MULITIVITAMIN WITH MINERALS) TABS Take 1 tablet by mouth daily. 03/18/11   Love, Ivan Anchors, PA-C  potassium chloride 20 MEQ TBCR Take 20 mEq by mouth daily. 01/14/14   Barton Dubois, MD  pravastatin (PRAVACHOL) 40 MG tablet Take 40 mg by mouth daily.    [provider]  rivaroxaban (XARELTO) 20 MG TABS tablet Take 20 mg by mouth daily with supper.    [provider]    Family History Family History  Problem Relation Age of Onset  . Breast cancer Maternal Aunt   . Diabetes Mellitus I Father   . Diabetes Mellitus I Sister   . Anesthesia problems Neg Hx   . Hypotension Neg Hx   . Malignant hyperthermia Neg Hx   . Pseudochol deficiency Neg Hx   . Colon cancer Neg Hx   . Esophageal cancer Neg Hx   . Pancreatic cancer Neg Hx   . Kidney disease Neg Hx   . Liver disease Neg Hx     Social History Social History   Tobacco Use  . Smoking status: Former Smoker    Packs/day: 0.25    Types: Cigarettes    Last attempt to quit: 02/15/2013    Years since quitting: 4.2  . Smokeless tobacco: Never Used  Substance Use Topics  . Alcohol use: No    Alcohol/week: 0.0 oz  . Drug use: No     Allergies   Patient has no known allergies.   Review of Systems Review of Systems  Constitutional: Positive for unexpected weight change. Negative for fever.  Respiratory: Positive for shortness of breath.   Cardiovascular: Negative for chest pain.  Gastrointestinal: Negative for abdominal pain.  Musculoskeletal: Positive for arthralgias.     Physical Exam Updated Vital Signs BP (!) 150/97 (BP Location: Right Arm)   Pulse (!) 115   Temp 97.8 F (36.6 C) (Oral)   Resp 18   SpO2 96%   Physical Exam  Constitutional: She is oriented to person, place, and time. She appears well-developed and well-nourished. No distress.  Frail elderly female in mild  distress due to pain.  Lying on her left side.  HENT:  Head: Normocephalic and atraumatic.  Hoarse voice  Eyes: Pupils are equal, round, and reactive to light. Conjunctivae are normal. Right eye exhibits no discharge. Left eye exhibits no discharge. No scleral icterus.  Neck: Normal range of motion.  Cardiovascular: Tachycardia present. Exam reveals no gallop and no friction rub.  No murmur heard. Pulmonary/Chest: Effort normal and breath sounds normal. No respiratory distress.  Abdominal: Soft. Bowel sounds are normal. She exhibits no distension. There is no tenderness.  Musculoskeletal:  Right hip is difficult to examine due to the patient's pain.  She is lying on her left side and has significant tenderness with palpation of the lateral hip.  She has a 2+ DP pulse.  Neurological: She is alert and oriented to person, place, and time.  Skin: Skin is  warm and dry.  Psychiatric: She has a normal mood and affect. Her behavior is normal.  Nursing note and vitals reviewed.    ED Treatments / Results  Labs (all labs ordered are listed, but only abnormal results are displayed) Labs Reviewed  COMPREHENSIVE METABOLIC PANEL - Abnormal; Notable for the following components:      Result Value   Chloride 98 (*)    Glucose, Bld 105 (*)    Albumin 2.6 (*)    ALT 13 (*)    All other components within normal limits  CBC WITH DIFFERENTIAL/PLATELET - Abnormal; Notable for the following components:   WBC 11.7 (*)    RBC 3.48 (*)    Hemoglobin 8.4 (*)    HCT 28.9 (*)    MCH 24.1 (*)    MCHC 29.1 (*)    RDW 16.5 (*)    Neutro Abs 9.7 (*)    All other components within normal limits  URINALYSIS, ROUTINE W REFLEX MICROSCOPIC    EKG EKG Interpretation  Date/Time:  Friday May 29 2017 12:15:58 EDT Ventricular Rate:  110 PR Interval:  128 QRS Duration: 76 QT Interval:  348 QTC Calculation: 470 R Axis:   73 Text Interpretation:  Sinus tachycardia Right atrial enlargement Borderline ECG  Since last tracing rate faster Confirmed by Noemi Chapel [phone]) on 05/29/2017 12:21:18 PM   Radiology Dg Chest 1 View  Result Date: 05/29/2017 CLINICAL DATA:  Pain following fall EXAM: CHEST  1 VIEW COMPARISON:  March 02, 2014 FINDINGS: No edema or consolidation. Heart size and pulmonary vascularity are normal. No adenopathy. There is aortic atherosclerosis. No pneumothorax. No bone lesions. IMPRESSION: Aortic atherosclerosis. No edema or consolidation. No pneumothorax. Aortic Atherosclerosis (ICD10-I70.0). Electronically Signed   By: Lowella Grip III M.D.   On: 05/29/2017 11:41   Ct Hip Right Wo Contrast  Result Date: 05/29/2017 CLINICAL DATA:  Right hip pain since a fall 3 days ago. Initial encounter. EXAM: CT OF THE RIGHT HIP WITHOUT CONTRAST TECHNIQUE: Multidetector CT imaging of the right hip was performed according to the standard protocol. Multiplanar CT image reconstructions were also generated. COMPARISON:  Plain films right hip 05/29/2017. FINDINGS: Bones/Joint/Cartilage As seen on the comparison plain films, the patient has an acute right intertrochanteric fracture. Impaction at the medial margin of the fracture of approximately 1 cm is identified. Mild varus angulation is noted. No finding to suggest pathologic fracture is identified. The right femoral head is located. Ligaments Suboptimally assessed by CT. Muscles and Tendons Intact. Soft tissues Imaged intrapelvic contents demonstrate no acute abnormality. IMPRESSION: Acute right intertrochanteric fracture as described above. No other acute abnormality. Electronically Signed   By: Inge Rise M.D.   On: 05/29/2017 13:45   Dg Hip Unilat W Or Wo Pelvis 2-3 Views Right  Result Date: 05/29/2017 CLINICAL DATA:  Pain following fall EXAM: DG HIP (WITH OR WITHOUT PELVIS) 2-3V RIGHT COMPARISON:  None. FINDINGS: Frontal pelvis as well as frontal and lateral right hip images were obtained. There is an intertrochanteric fracture of the right  femur with varus angulation at the fracture site. No other fracture. No dislocation. There is mild symmetric narrowing of both hip joints. IMPRESSION: Intertrochanteric femur fracture on the right with varus angulation at the fracture site. No other fracture. No dislocation. Mild symmetric narrowing of each hip joint. Electronically Signed   By: Lowella Grip III M.D.   On: 05/29/2017 11:40    Procedures Procedures (including critical care time)  Medications Ordered in ED Medications  HYDROmorphone (DILAUDID) injection 0.5 mg (has no administration in time range)  sodium chloride 0.9 % bolus 500 mL (has no administration in time range)     Initial Impression / Assessment and Plan / ED Course  I have reviewed the triage vital signs and the nursing notes.  Pertinent labs & imaging results that were available during my care of the patient were reviewed by me and considered in my medical decision making (see chart for details).  67 year old female presents with an acute right hip fracture after a fall 3 days ago.  She is hypertensive and tachycardic but otherwise vital signs are normal.  On exam her heart rate is tachycardic but there is a regular rhythm.  EKG confirms sinus tachycardia.  Her lung exam is remarkable for rhonchorous breath sounds and abnormal voice.  Family states that this is been chronic.  Her chest x-ray is actually negative for any acute abnormality.  CBC is remarkable for mild leukocytosis and anemia which is around her baseline.  CMP is overall unremarkable.  UA was ordered.  She was given pain medicine and fluids.  Discussed with Hilbert Odor PA-C with orthopedics who has come to see the patient and plans for surgery this afternoon.  Discussed with Dyanne Carrel with [facility] who will admit the patient.  This is a shared visit with Dr. Sabra Heck.  Final Clinical Impressions(s) / ED Diagnoses   Final diagnoses:  Closed displaced intertrochanteric fracture of right femur,  initial encounter [facility])    ED Discharge Orders    None       Recardo Evangelist, PA-C 05/29/17 1357    Noemi Chapel, MD 05/29/17 [phone]

## 2017-05-29 NOTE — Brief Op Note (Signed)
05/29/2017  7:58 PM  PATIENT:  Katelyn [name]  67 y.o. female  PRE-OPERATIVE DIAGNOSIS:  closed displaced intertoch fracture of right femur  POST-OPERATIVE DIAGNOSIS:  closed displaced intertoch fracture of right femur  PROCEDURE:  Procedure(s): INTRAMEDULLARY (IM) NAIL INTERTROCHANTRIC FEMUR (Right)  SURGEON:  Surgeon(s) and Role:    * Nicholes Stairs, MD - Primary  PHYSICIAN ASSISTANT:   ASSISTANTS: none   ANESTHESIA:   spinal  EBL:  50 mL   BLOOD ADMINISTERED:none  DRAINS: none   LOCAL MEDICATIONS USED:  NONE  SPECIMEN:  No Specimen  DISPOSITION OF SPECIMEN:  N/A  COUNTS:  YES  TOURNIQUET:  * No tourniquets in log *  DICTATION: .Note written in EPIC  PLAN OF CARE: Admit to inpatient   PATIENT DISPOSITION:  ICU - intubated and hemodynamically stable.   Delay start of Pharmacological VTE agent (>24hrs) due to surgical blood loss or risk of bleeding: not applicable

## 2017-05-29 NOTE — Consult Note (Signed)
PULMONARY / CRITICAL CARE MEDICINE   Name: Katelyn [name] MRN: [id number] DOB: [birth year]    ADMISSION DATE:  05/29/2017 CONSULTATION DATE:  05/29/2017   REFERRING MD:  Dr. Stann Mainland   CHIEF COMPLAINT:  S/P Tracheostomy   HISTORY OF PRESENT ILLNESS:   67 year old female with PMH of CVA, DVT, HTN, Epiglottic Lesion. Baseline uses pampers, requires assistance feeding, with chronic right-sided weakness secondary to CVA, non-compliant with walker     Presents to ED on 4/5 with right hip pain s/p fall. Take to OR for Intramedullary Nail Intertrochanteric Femur. Underwent Spinal Block for procedure. Was given 25 mcg fentanyl, bupivacaine, and 10 mg propofol. Tolerated procedure well and taken to PACU. In PACU patient was noted to be obtunded and hypoxic with saturation 70-80 despite supplemental supplemental oxygen and attempted bagging. Taken to OR for intubated due to epiglottic mass. Unable to place ETT so Tracheostomy was placed.   PAST MEDICAL HISTORY :  She  has a past medical history of Acute gallstone pancreatitis (01/08/2014), AKI (acute kidney injury) ([place]) (01/08/2014), Anemia, Blood transfusion, Cholecystitis, Hypercholesteremia, Hypertension, Right leg DVT ([place]) (05/08/2014), Septic shock - Kelbsiella (01/09/2014), Shortness of breath, and Stroke ([place]).  PAST SURGICAL HISTORY: She  has a past surgical history that includes Abdominal hysterectomy; TEE without cardioversion (03/06/2011); Endoscopic retrograde cholangiopancreatography (ercp) with propofol (N/A, 01/11/2014); ERCP; ERCP (N/A, 05/26/2014); EUS (N/A, 12/07/2014); Esophagogastroduodenoscopy (egd) with propofol (N/A, 12/07/2014); and Gastrointestinal stent removal (N/A, 12/07/2014).  No Known Allergies  No current facility-administered medications on file prior to encounter.    Current Outpatient Medications on File Prior to Encounter  Medication Sig  . acetaminophen (TYLENOL) 325 MG tablet Take by mouth as needed for mild pain or  headache.  . carvedilol (COREG) 12.5 MG tablet Take 1 tablet (12.5 mg total) by mouth 2 (two) times daily with a meal. (Patient not taking: Reported on 05/29/2017)  . furosemide (LASIX) 20 MG tablet Take 1 tablet (20 mg total) by mouth daily. (Patient not taking: Reported on 05/29/2017)  . potassium chloride 20 MEQ TBCR Take 20 mEq by mouth daily. (Patient not taking: Reported on 05/29/2017)    FAMILY HISTORY:  Her indicated that the status of her father is unknown. She indicated that the status of her sister is unknown. She indicated that only one of her two maternal aunts is alive. She indicated that the status of her neg hx is unknown.   SOCIAL HISTORY: She  reports that she quit smoking about 4 years ago. Her smoking use included cigarettes. She smoked 0.25 packs per day. She has never used smokeless tobacco. She reports that she does not drink alcohol or use drugs.  REVIEW OF SYSTEMS:   Unable to review as patient is intubated   SUBJECTIVE:   VITAL SIGNS: BP 134/77   Pulse 87   Temp 98.1 F (36.7 C)   Resp (!) 24   SpO2 (!) 85%   HEMODYNAMICS:    VENTILATOR SETTINGS:    INTAKE / OUTPUT: No intake/output data recorded.  PHYSICAL EXAMINATION: General: Elderly female, no distress  Neuro:  Pupils intact, alert, follows commands  HEENT:  Trach in place Cardiovascular:  RRR, no MRG  Lungs:  Clear breath sounds, no wheeze/crackles  Abdomen:  Active bowel sounds, non-distended  Musculoskeletal:  -edema  Skin:  Warm, dry, intact   LABS:  BMET Recent Labs  Lab 05/29/17 1143  NA 138  K 3.7  CL 98*  CO2 28  BUN 19  CREATININE 0.80  GLUCOSE 105*    Electrolytes Recent Labs  Lab 05/29/17 1143  CALCIUM 9.6    CBC Recent Labs  Lab 05/29/17 1143  WBC 11.7*  HGB 8.4*  HCT 28.9*  PLT 322    Coag's Recent Labs  Lab 05/29/17 1526  INR 1.14    Sepsis Markers No results for input(s): LATICACIDVEN, PROCALCITON, O2SATVEN in the last 168 hours.  ABG No  results for input(s): PHART, PCO2ART, PO2ART in the last 168 hours.  Liver Enzymes Recent Labs  Lab 05/29/17 1143  AST 19  ALT 13*  ALKPHOS 72  BILITOT 0.8  ALBUMIN 2.6*    Cardiac Enzymes No results for input(s): TROPONINI, PROBNP in the last 168 hours.  Glucose No results for input(s): GLUCAP in the last 168 hours.  Imaging Dg Chest 1 View  Result Date: 05/29/2017 CLINICAL DATA:  Pain following fall EXAM: CHEST  1 VIEW COMPARISON:  March 02, 2014 FINDINGS: No edema or consolidation. Heart size and pulmonary vascularity are normal. No adenopathy. There is aortic atherosclerosis. No pneumothorax. No bone lesions. IMPRESSION: Aortic atherosclerosis. No edema or consolidation. No pneumothorax. Aortic Atherosclerosis (ICD10-I70.0). Electronically Signed   By: Lowella Grip III M.D.   On: 05/29/2017 11:41   Ct Hip Right Wo Contrast  Result Date: 05/29/2017 CLINICAL DATA:  Right hip pain since a fall 3 days ago. Initial encounter. EXAM: CT OF THE RIGHT HIP WITHOUT CONTRAST TECHNIQUE: Multidetector CT imaging of the right hip was performed according to the standard protocol. Multiplanar CT image reconstructions were also generated. COMPARISON:  Plain films right hip 05/29/2017. FINDINGS: Bones/Joint/Cartilage As seen on the comparison plain films, the patient has an acute right intertrochanteric fracture. Impaction at the medial margin of the fracture of approximately 1 cm is identified. Mild varus angulation is noted. No finding to suggest pathologic fracture is identified. The right femoral head is located. Ligaments Suboptimally assessed by CT. Muscles and Tendons Intact. Soft tissues Imaged intrapelvic contents demonstrate no acute abnormality. IMPRESSION: Acute right intertrochanteric fracture as described above. No other acute abnormality. Electronically Signed   By: Inge Rise M.D.   On: 05/29/2017 13:45   Dg C-arm 1-60 Min  Result Date: 05/29/2017 CLINICAL DATA:  Right femoral  intramedullary nail fixation. EXAM: RIGHT FEMUR 2 VIEWS; DG C-ARM 61-120 MIN COMPARISON:  Preoperative CT and radiographs from the same day FINDINGS: A total of 48 seconds of fluoroscopic time was utilized for placement of right femoral nail across an intertrochanteric fracture of the right femur. Fine bony detail is limited on the 4 images acquired from the fluoroscopic technique. No intraoperative nor immediate postoperative complications are identified. IMPRESSION: New intramedullary nail fixation of intertrochanteric fracture of the right femur without immediate complications identified. Electronically Signed   By: Ashley Royalty M.D.   On: 05/29/2017 19:45   Dg Hip Unilat W Or Wo Pelvis 2-3 Views Right  Result Date: 05/29/2017 CLINICAL DATA:  Pain following fall EXAM: DG HIP (WITH OR WITHOUT PELVIS) 2-3V RIGHT COMPARISON:  None. FINDINGS: Frontal pelvis as well as frontal and lateral right hip images were obtained. There is an intertrochanteric fracture of the right femur with varus angulation at the fracture site. No other fracture. No dislocation. There is mild symmetric narrowing of both hip joints. IMPRESSION: Intertrochanteric femur fracture on the right with varus angulation at the fracture site. No other fracture. No dislocation. Mild symmetric narrowing of each hip joint. Electronically Signed   By: Lowella Grip III M.D.   On: 05/29/2017 11:40  Dg Femur, Min 2 Views Right  Result Date: 05/29/2017 CLINICAL DATA:  Right femoral intramedullary nail fixation. EXAM: RIGHT FEMUR 2 VIEWS; DG C-ARM 61-120 MIN COMPARISON:  Preoperative CT and radiographs from the same day FINDINGS: A total of 48 seconds of fluoroscopic time was utilized for placement of right femoral nail across an intertrochanteric fracture of the right femur. Fine bony detail is limited on the 4 images acquired from the fluoroscopic technique. No intraoperative nor immediate postoperative complications are identified. IMPRESSION: New  intramedullary nail fixation of intertrochanteric fracture of the right femur without immediate complications identified. Electronically Signed   By: Ashley Royalty M.D.   On: 05/29/2017 19:45     STUDIES:  XRay Right Hip 4/5 > Intertrochanteric femur fracture on the right with varus angulation at the fracture site. No other fracture. No dislocation. Mild symmetric narrowing of each hip joint. CXR 4/5 > No edema or consolidation. Heart size and pulmonary vascularity are normal. No adenopathy. There is aortic atherosclerosis. No pneumothorax. No bone lesions CT Hip 4/5 > Acute right intertrochanteric fracture as described above. No other acute abnormality  CULTURES: None.   ANTIBIOTICS: Ancef 4/5    SIGNIFICANT EVENTS: 4/5 > Presents to ED   LINES/TUBES: PIV   DISCUSSION: 67 year old female presents s/p Fall with right hip fracture. Taken to OR. In PACU patient went into respiratory distress requiring tracheostomy placement in OR.   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   P:   NPO PPI   Chronic Kidney Disease stage 3  Plan  -Trend BMP  -Avoid Nephrotoxic Medications  -LR @ 75 ml/hr   Anemia of Chronic Disease  -Hemoglobin 8.6 prior to surgery  P:  Trend CBC  Maintain Hbg >7   Right Hip Fracture s/p IM Nail secondary to Fall  Plan: Per Ortho   Sedation Needs  Weakness/Deconditioning  H/O CVA with Chronic Right Sided Weakness  P:   RASS goal: 0/-1 Fentanyl and Versed PRN    FAMILY  - Updates: No family at bedside   - Inter-disciplinary family meet or Palliative Care meeting due  by: 06/05/2017   Hayden Pedro, AGACNP-BC [facility]  Pgr: [phone]  PCCM Pgr: [phone]

## 2017-05-29 NOTE — ED Notes (Signed)
Report given to OR holding RN , Consent signed

## 2017-05-29 NOTE — Op Note (Signed)
Date of Surgery: 05/29/2017  INDICATIONS: Katelyn [name] is a 67 y.o.-year-old female who sustained a right hip fracture. The risks and benefits of the procedure discussed with the patient and family prior to the procedure and all questions were answered; consent was obtained.  PREOPERATIVE DIAGNOSIS: right hip fracture   POSTOPERATIVE DIAGNOSIS: Same   PROCEDURE: Treatment of intertrochanteric, pertrochanteric, subtrochanteric fracture with intramedullary implant. CPT [phone]   SURGEON: Dannielle Karvonen. Stann Mainland, M.D.   ANESTHESIA: Spinal  IV FLUIDS AND URINE: See anesthesia record   ESTIMATED BLOOD LOSS: 50  IMPLANTS:   Synthes TFN 10 mm diameter by intermediate length 75 mm compression screw proximal 30 mm x 5 mm distal interlock   DRAINS: None.   COMPLICATIONS: None.   DESCRIPTION OF PROCEDURE:   The patient was brought to the operating room and placed supine on the operating table. The patient's leg had been signed prior to the procedure. The patient had the anesthesia placed by the anesthesiologist. The prep verification and incision time-outs were performed to confirm that this was the correct patient, site, side and location. The patient had an SCD on the opposite lower extremity. The patient did receive antibiotics prior to the incision and was re-dosed during the procedure as needed at indicated intervals. The patient was positioned on the fracture table with the table in traction and internal rotation to reduce the hip. The well leg was placed in a scissor position and all bony prominences were well-padded. The patient had the lower extremity prepped and draped in the standard surgical fashion. The incision was made 4 finger breadths superior to the greater trochanter. A guide pin was inserted into the tip of the greater trochanter under fluoroscopic guidance. An opening reamer was used to gain access to the femoral canal. The nail length was measured and inserted down the femoral canal to  its proper depth. The appropriate version of insertion for the lag screw was found under fluoroscopy. A pin was inserted up the femoral neck through the jig. The length of the lag screw was then measured. The lag screw was inserted as near to center-center in the head as possible. The leg was taken out of traction, then the compression screw was used to compress across the fracture. Compression was visualized on serial xrays.   We next turned our attention to the distal interlocking screw.  This was placed through the drill guide of the nail inserter.  A small incision was made overlying the lateral thigh at the screw site, and a tonsil was used to disect down to bone.  A drill pass was made through the jig and across the nail through both cortices.  This was measured, and the appropriate screw was placed under hand power and found to have good bite.    The wound was copiously irrigated with saline and the subcutaneous layer closed with 2.0 vicryl and the skin was reapproximated with staples. The wounds were cleaned and dried a final time and a sterile dressing was placed. The hip was taken through a range of motion at the end of the case under fluoroscopic imaging to visualize the approach-withdraw phenomenon and confirm implant length in the head. The patient was then awakened from anesthesia and taken to the recovery room in stable condition. All counts were correct at the end of the case.    POSTOPERATIVE PLAN: The patient will be weight bearing as tolerated and will return in 2 weeks for staple removal and the patient will receive DVT prophylaxis  based on other medications, activity level, and risk ratio of bleeding to thrombosis.     Postoperative course: Following a successful intramedullary nailing with spinal anesthetic the patient developed respiratory distress and PACU.  She ultimately needed to be taken back to the operating room for a surgical airway to be placed by Dr. Hulen Skains.  Please see his  operative report separately.  Following a tracheostomy she had stable cardiovascular and pulmonary status.  She was transferred to the ICU following the procedure for maintenance of her airway.     Geralynn Rile, [place] [phone] 8:42 PM

## 2017-05-29 NOTE — Op Note (Signed)
OPERATIVE REPORT  DATE OF OPERATION: 05/29/2017  PATIENT:  Katelyn [name]  67 y.o. 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  The neck was being prepped and draped while they were attempting to obtain an airway orally with a bronchoscope, a nasal endotracheal tube but they were met with resistance because of a very friable, bleeding epiglottic mass.  I prepped the neck with Betadine and subsequently prepped with ChloraPrep.  A timeout was performed.  I anesthetized with about 3 cc of 1% Xylocaine solution and made a vertical incision using a #15 blade.  I was able to obtain the tracheal rings and passed a angiocatheter through the tracheal rings into the tracheal lumen.  I removed the inner needle then passed a green wire into the trachea.  We then used a short blue dilator and then the serial Blue Rhino dilator opening the tracheotomy.  Subsequently passed a #6 tracheostomy tube over the guide into the tracheotomy and secured in place.  There is excellent gas exchange to return the patient oxygenated well.  The tracheostomy tube was secured in place using stitches on both flanges.  A dressing was applied all counts were correct.  PATIENT DISPOSITION:  To the ICU in stable condition with an emergency airway   Judeth Horn 4/5/20199:01 PM

## 2017-05-29 NOTE — ED Provider Notes (Signed)
Medical screening examination/treatment/procedure(s) were conducted as a shared visit with non-physician practitioner(s) and myself.  I personally evaluated the patient during the encounter.  Clinical Impression:   Final diagnoses:  Closed displaced intertrochanteric fracture of right femur, initial encounter [facility])   The patient is a pleasant 67 year old female who had a fall several days ago, she comes into today with increasing pain in her hip, on exam she does have pain with range of motion of the hip, otherwise does not appear to have any significant injuries.  Orthopedics and internal medicine were consulted, likely having surgery today.   EKG Interpretation  Date/Time:  Friday May 29 2017 12:15:58 EDT Ventricular Rate:  110 PR Interval:  128 QRS Duration: 76 QT Interval:  348 QTC Calculation: 470 R Axis:   73 Text Interpretation:  Sinus tachycardia Right atrial enlargement Borderline ECG Since last tracing rate faster Confirmed by Noemi Chapel [phone]) on 05/29/2017 12:21:18 PM         Noemi Chapel, MD 05/29/17 [phone]

## 2017-05-29 NOTE — Anesthesia Preprocedure Evaluation (Addendum)
Anesthesia Evaluation  Patient identified by MRN, date of birth, ID band Patient awake    Reviewed: Allergy & Precautions, H&P , Patient's Chart, lab work & pertinent test results  Airway Mallampati: II  TM Distance: >3 FB Neck ROM: full   Comment: Upper airway mass noticed on DL 3 yrs ago. Pt not with very unusual voice and intermittently obstructing when awake. Very high grade concern for airway problems.  Dental no notable dental hx.    Pulmonary former smoker,    Pulmonary exam normal breath sounds clear to auscultation+ rhonchi   + stridor     Cardiovascular Exercise Tolerance: Good hypertension,  Rhythm:regular Rate:Normal     Neuro/Psych    GI/Hepatic   Endo/Other    Renal/GU      Musculoskeletal   Abdominal   Peds  Hematology   Anesthesia Other Findings   Reproductive/Obstetrics                             Anesthesia Physical Anesthesia Plan  ASA: II  Anesthesia Plan: Spinal   Post-op Pain Management:    Induction:   PONV Risk Score and Plan:   Airway Management Planned:   Additional Equipment:   Intra-op Plan:   Post-operative Plan:   Informed Consent: I have reviewed the patients History and Physical, chart, labs and discussed the procedure including the risks, benefits and alternatives for the proposed anesthesia with the patient or authorized representative who has indicated his/her understanding and acceptance.     Plan Discussed with:   Anesthesia Plan Comments: (No anti-coags; we'll use spinal and avoid sedation/GA related airway emergency Hb low-expect transfusion NPO Discussed with family and then with Dr Stann Mainland; she should be offered f/u on airway mass while hospitalized  Systolic function was at the lower limits of normal. The estimated ejection fraction was in the range of 50% to 55%. Mild diffuse hypokinesis with no identifiable regional  variations)       Anesthesia Quick Evaluation

## 2017-05-29 NOTE — Progress Notes (Signed)
Patient arrived to PACU bay 10 at 1927. Obtunded. Unresponsive. History of mass in throat. Obstructing respirations. O2 at 10 liters /simple mask. Increase to 15 L per NRB mask.  Attempted repositioning and increasing with no improvement. Dr. Roanna Banning and Dr. Stann Mainland notified. Drs. Ellender and Rogers at bedside at [employer]. Bagged at 15 L . Pt remains unresponsive. O2 sat. Slightly improved. Narcan .1mg  IV with no response. Decision made to return to OR for Intubation vs trach.    to OR at 50

## 2017-05-29 NOTE — Progress Notes (Signed)
Pt is currently on Tidal volume of 410.  Pt's 8cc is a Tidal Volume 290.  ABG ran and results given to MD.  Per MD keep ltidal volume as it is now. Will recheck ABG in about 2hrs.

## 2017-05-29 NOTE — H&P (Signed)
History and Physical    Katelyn [name] WFU:[id number] DOB: 08/08/1950 DOA: 05/29/2017  PCP: Aretta Nip, MD Patient coming from: home  Chief Complaint: right hip pain  HPI: Katelyn [name] is a 67 y.o. female with medical history significant  Stroke, DVT not on anticoagulation, a side weakness, hypertension, gallstone pancreatitis, tobacco use, epiglottis lesion presents to the emergency department with the chief complaint of right hip pain after a fall. Initial evaluation reveals a right hip fracture. [facility] are asked to admit  Information is obtained from the patient and her family his at the bedside and the chart. He reports patient fell 2 days ago after falling asleep in her chair. He felt immediate pain in her right hip is been unable to ambulate ever since. Wearing adult Pampers for elimination needs and her significant other has been feeding. Family reports that patient does not "take care of herself and go to the doctor when she's post 2". They report she's been told in the past she has a lesion in her esophagus and should follow-up and she did not. They report her voice is changing her ability to swallow his becoming impaired and she has lost weight. Vision denies headache dizziness syncope or near-syncope. She denies chest pain palpitation shortness of breath. She denies abdominal pain nausea vomiting diarrhea constipation melena bright red blood per rectum. He denies dysuria hematuria frequency or urgency. She does continue to smoke.she complains of being hungry. She reports pain medicineis relieving pain in her right hip    ED Course: in the emergency department she's afebrile hemodynamically stable and not hypoxic. She is provided with IV fluids analgesia  Review of Systems: As per HPI otherwise all other systems reviewed and are negative.   Ambulatory Status: family reports patient has some right-sided weakness from previous stroke and is supposed to use a  walker but she is noncompliant. Fairly independent with ADLs  Past Medical History:  Diagnosis Date  . Acute gallstone pancreatitis 01/08/2014  . AKI (acute kidney injury) ([place]) 01/08/2014  . Anemia   . Blood transfusion    11'15 last admission  . Cholecystitis   . Hypercholesteremia   . Hypertension   . Right leg DVT ([place]) 05/08/2014  . Septic shock - Kelbsiella 01/09/2014  . Shortness of breath   . Stroke [facility])    '13- right side weakness, ambulates with cane    Past Surgical History:  Procedure Laterality Date  . ABDOMINAL HYSTERECTOMY    . ENDOSCOPIC RETROGRADE CHOLANGIOPANCREATOGRAPHY (ERCP) WITH PROPOFOL N/A 01/11/2014   Procedure: ENDOSCOPIC RETROGRADE CHOLANGIOPANCREATOGRAPHY (ERCP) WITH PROPOFOL;  Surgeon: Gatha Mayer, MD;  Location: [facility];  Service: Endoscopy;  Laterality: N/A;  . ERCP    . ERCP N/A 05/26/2014   Procedure: ENDOSCOPIC RETROGRADE CHOLANGIOPANCREATOGRAPHY (ERCP);  Surgeon: Gatha Mayer, MD;  Location: [facility];  Service: Endoscopy;  Laterality: N/A;  . ESOPHAGOGASTRODUODENOSCOPY (EGD) WITH PROPOFOL N/A 12/07/2014   Procedure: ESOPHAGOGASTRODUODENOSCOPY (EGD) WITH PROPOFOL;  Surgeon: Milus Banister, MD;  Location: [facility];  Service: Endoscopy;  Laterality: N/A;  . EUS N/A 12/07/2014   Procedure: UPPER ENDOSCOPIC ULTRASOUND (EUS) LINEAR;  Surgeon: Milus Banister, MD;  Location: [facility];  Service: Endoscopy;  Laterality: N/A;  . GASTROINTESTINAL STENT REMOVAL N/A 12/07/2014   Procedure: GASTROINTESTINAL STENT REMOVAL;  Surgeon: Milus Banister, MD;  Location: [facility];  Service: Endoscopy;  Laterality: N/A;  pancreatic stent removal  . TEE WITHOUT CARDIOVERSION  03/06/2011   Procedure: TRANSESOPHAGEAL ECHOCARDIOGRAM (TEE);  Surgeon:  Jolaine Artist, MD;  Location: [facility] ENDOSCOPY;  Service: Cardiovascular;  Laterality: N/A;    Social History   Socioeconomic History  . Marital status: Widowed    Spouse name: Not on file  .  Number of children: Not on file  . Years of education: Not on file  . Highest education level: Not on file  Occupational History  . Occupation: Retired  Scientific laboratory technician  . Financial resource strain: Not on file  . Food insecurity:    Worry: Not on file    Inability: Not on file  . Transportation needs:    Medical: Not on file    Non-medical: Not on file  Tobacco Use  . Smoking status: Former Smoker    Packs/day: 0.25    Types: Cigarettes    Last attempt to quit: 02/15/2013    Years since quitting: 4.2  . Smokeless tobacco: Never Used  Substance and Sexual Activity  . Alcohol use: No    Alcohol/week: 0.0 oz  . Drug use: No  . Sexual activity: Not on file  Lifestyle  . Physical activity:    Days per week: Not on file    Minutes per session: Not on file  . Stress: Not on file  Relationships  . Social connections:    Talks on phone: Not on file    Gets together: Not on file    Attends religious service: Not on file    Active member of club or organization: Not on file    Attends meetings of clubs or organizations: Not on file    Relationship status: Not on file  . Intimate partner violence:    Fear of current or ex partner: Not on file    Emotionally abused: Not on file    Physically abused: Not on file    Forced sexual activity: Not on file  Other Topics Concern  . Not on file  Social History Narrative  . Not on file    No Known Allergies  Family History  Problem Relation Age of Onset  . Breast cancer Maternal Aunt   . Diabetes Mellitus I Father   . Diabetes Mellitus I Sister   . Anesthesia problems Neg Hx   . Hypotension Neg Hx   . Malignant hyperthermia Neg Hx   . Pseudochol deficiency Neg Hx   . Colon cancer Neg Hx   . Esophageal cancer Neg Hx   . Pancreatic cancer Neg Hx   . Kidney disease Neg Hx   . Liver disease Neg Hx     Prior to Admission medications   Medication Sig Start Date End Date Taking? Authorizing Provider  acetaminophen (TYLENOL) 325  MG tablet Take by mouth as needed for mild pain or headache.   Yes [provider]  carvedilol (COREG) 12.5 MG tablet Take 1 tablet (12.5 mg total) by mouth 2 (two) times daily with a meal. Patient not taking: Reported on 05/29/2017 01/14/14   Barton Dubois, MD  furosemide (LASIX) 20 MG tablet Take 1 tablet (20 mg total) by mouth daily. Patient not taking: Reported on 05/29/2017 01/14/14   Barton Dubois, MD  potassium chloride 20 MEQ TBCR Take 20 mEq by mouth daily. Patient not taking: Reported on 05/29/2017 01/14/14   Barton Dubois, MD    Physical Exam: Vitals:   05/29/17 1024  BP: (!) 150/97  Pulse: (!) 115  Resp: 18  Temp: 97.8 F (36.6 C)  TempSrc: Oral  SpO2: 96%     General:  Appears  slightly anxious but in no acute distress Eyes:  PERRL, EOMI, normal lids, iris ENT:  grossly normal hearing, lips & tongue, ucous membranes of her mouth are pink slightly dry Neck:  no LAD, masses or thyromegaly Cardiovascular:  RRR, no m/r/g. No LE edema.  Respiratory:  CTA bilaterally, no w/r/r. Normal respiratory effort. Abdomen:  soft, ntnd, NABS Skin:  no rash or induration seen on limited exam Musculoskeletal:  Right leg externally rotated right hip quite tender to palpation decreased range of motion due to pain other joints without swelling/erythema Psychiatric:  grossly normal mood and affect, speech fluent and appropriate, AOx3 Neurologic:  CN 2-12 grossly intact, moves all extremities in coordinated fashion, sensation intact oriented to self and place. Able to follow simple commands. [place] is slow but clear  Labs on Admission: I have personally reviewed following labs and imaging studies  CBC: Recent Labs  Lab 05/29/17 1143  WBC 11.7*  NEUTROABS 9.7*  HGB 8.4*  HCT 28.9*  MCV 83.0  PLT 161   Basic Metabolic Panel: Recent Labs  Lab 05/29/17 1143  NA 138  K 3.7  CL 98*  CO2 28  GLUCOSE 105*  BUN 19  CREATININE 0.80  CALCIUM 9.6   GFR: CrCl cannot be  calculated (Unknown ideal weight.). Liver Function Tests: Recent Labs  Lab 05/29/17 1143  AST 19  ALT 13*  ALKPHOS 72  BILITOT 0.8  PROT 7.9  ALBUMIN 2.6*   No results for input(s): LIPASE, AMYLASE in the last 168 hours. No results for input(s): AMMONIA in the last 168 hours. Coagulation Profile: No results for input(s): INR, PROTIME in the last 168 hours. Cardiac Enzymes: No results for input(s): CKTOTAL, CKMB, CKMBINDEX, TROPONINI in the last 168 hours. BNP (last 3 results) No results for input(s): PROBNP in the last 8760 hours. HbA1C: No results for input(s): HGBA1C in the last 72 hours. CBG: No results for input(s): GLUCAP in the last 168 hours. Lipid Profile: No results for input(s): CHOL, HDL, LDLCALC, TRIG, CHOLHDL, LDLDIRECT in the last 72 hours. Thyroid Function Tests: No results for input(s): TSH, T4TOTAL, FREET4, T3FREE, THYROIDAB in the last 72 hours. Anemia Panel: No results for input(s): VITAMINB12, FOLATE, FERRITIN, TIBC, IRON, RETICCTPCT in the last 72 hours. Urine analysis:    Component Value Date/Time   COLORURINE AMBER (A) 01/07/2014 2323   APPEARANCEUR CLOUDY (A) 01/07/2014 2323   LABSPEC 1.022 01/07/2014 2323   PHURINE 6.0 01/07/2014 2323   GLUCOSEU NEGATIVE 01/07/2014 2323   HGBUR NEGATIVE 01/07/2014 2323   HGBUR trace-lysed 12/30/2006 0956   BILIRUBINUR SMALL (A) 01/07/2014 2323   KETONESUR 15 (A) 01/07/2014 2323   PROTEINUR NEGATIVE 01/07/2014 2323   UROBILINOGEN 4.0 (H) 01/07/2014 2323   NITRITE NEGATIVE 01/07/2014 2323   LEUKOCYTESUR NEGATIVE 01/07/2014 2323    Creatinine Clearance: CrCl cannot be calculated (Unknown ideal weight.).  Sepsis Labs: @LABRCNTIP (procalcitonin:4,lacticidven:4) )No results found for this or any previous visit (from the past 240 hour(s)).   Radiological Exams on Admission: Dg Chest 1 View  Result Date: 05/29/2017 CLINICAL DATA:  Pain following fall EXAM: CHEST  1 VIEW COMPARISON:  March 02, 2014 FINDINGS: No  edema or consolidation. Heart size and pulmonary vascularity are normal. No adenopathy. There is aortic atherosclerosis. No pneumothorax. No bone lesions. IMPRESSION: Aortic atherosclerosis. No edema or consolidation. No pneumothorax. Aortic Atherosclerosis (ICD10-I70.0). Electronically Signed   By: Lowella Grip III M.D.   On: 05/29/2017 11:41   Ct Hip Right Wo Contrast  Result Date: 05/29/2017 CLINICAL DATA:  Right hip pain since a fall 3 days ago. Initial encounter. EXAM: CT OF THE RIGHT HIP WITHOUT CONTRAST TECHNIQUE: Multidetector CT imaging of the right hip was performed according to the standard protocol. Multiplanar CT image reconstructions were also generated. COMPARISON:  Plain films right hip 05/29/2017. FINDINGS: Bones/Joint/Cartilage As seen on the comparison plain films, the patient has an acute right intertrochanteric fracture. Impaction at the medial margin of the fracture of approximately 1 cm is identified. Mild varus angulation is noted. No finding to suggest pathologic fracture is identified. The right femoral head is located. Ligaments Suboptimally assessed by CT. Muscles and Tendons Intact. Soft tissues Imaged intrapelvic contents demonstrate no acute abnormality. IMPRESSION: Acute right intertrochanteric fracture as described above. No other acute abnormality. Electronically Signed   By: Inge Rise M.D.   On: 05/29/2017 13:45   Dg Hip Unilat W Or Wo Pelvis 2-3 Views Right  Result Date: 05/29/2017 CLINICAL DATA:  Pain following fall EXAM: DG HIP (WITH OR WITHOUT PELVIS) 2-3V RIGHT COMPARISON:  None. FINDINGS: Frontal pelvis as well as frontal and lateral right hip images were obtained. There is an intertrochanteric fracture of the right femur with varus angulation at the fracture site. No other fracture. No dislocation. There is mild symmetric narrowing of both hip joints. IMPRESSION: Intertrochanteric femur fracture on the right with varus angulation at the fracture site. No  other fracture. No dislocation. Mild symmetric narrowing of each hip joint. Electronically Signed   By: Lowella Grip III M.D.   On: 05/29/2017 11:40    EKG: Sinus tachycardia Right atrial enlargement Borderline ECGr  Assessment/Plan Principal Problem:   Closed right hip fracture (HCC) Active Problems:   Hypercholesteremia   Essential hypertension   Ataxia following cerebral infarction   CKD (chronic kidney disease) stage 3, GFR 30-59 ml/min (HCC)   Diastolic dysfunction   Tobacco abuse   Anemia   Lesion of epiglottis   Dysphagia   Weight loss   #1. Right hip fracture as a result of a fall.X-ray as noted above. Evaluated by orthopedic surgery who recommended medicine admission and plan for surgery this afternoon.chest x-ray without infiltrate. EKG as noted above. Urinalysis pending at time of admission -Admit -Follow urinalysis -Obtain PT/INR -gentle IV fluids -nothing by mouth -Supportive therapy -Further management per orthopedics  2. Anemia. Hemoglobin 8.6 on admission. Chart review indicates history of same. Likely related chronic disease. -fobt -anemia panel -may need transfusion after surgery  #3. Chronic kidney disease stage III. Creatinine within the limits of normal on admission. -Monitor urine output -Gentle IV fluids as noted above -Check in the morning  #4. Hypertension. Blood pressure high end of normal in the emergency department. Home medications include Coreg, Lasix. With a long history of noncompliance with medications and outpatient follow-up -Hold these medications for now - when necessary hydralazine -Monitor  #5. History of stroke and DVT. Patient should be on anticoagulation. Chart review indicates patient has been on Xarelto in the past. Last documentation 2016. -will need to discuss lifelong anticoag needs post op  #6. Diastolic dysfunction. Appears compensated. Echo in 2015 reveals an EF of 55% mild hypokinesia grade 1 diastolic dysfunction.  Home medications include Coreg and Lasix. Questionable compliance -monitor intake and output -Obtain daily weights  #7. Tobacco use -cessation counseling  #8.dysphagia/weight loss/?lesion of epiglottis -family reports patient informed of "growth" years ago and she never followed up. Reports difficulty swallowing worsening and weight loss.  -nutritional consult -speech therapy    DVT prophylaxis: scd  Code  Status: full  Family Communication: daughter  Disposition Plan: snf  Consults called: micheal jeffrey  Admission status: inpatient    Radene Gunning MD [facility]  If 7PM-7AM, please contact night-coverage [email or web address] Password [email or web address]  05/29/2017, 2:40 PM

## 2017-05-29 NOTE — ED Triage Notes (Signed)
Pt came from home after a fall 3 days ago. COmplains of severe right hip pain. Pt pulled out of a car by ER staff.

## 2017-05-29 NOTE — Consult Note (Signed)
Reason for Consult:Right hip fx Referring Physician: B Haunani [name] is an 67 y.o. female.  HPI: Katelyn [name] has a bad habit of falling asleep at her table and then falling out of her chair. This happened again on Wednesday when she fell onto her right side. She was unable to ambulate afterwards but once she got put back to bed she toughed it out at home until today when she came to the ED for evaluation. X-rays showed a right hip fx and orthopedic surgery was consulted. She lives with a BF and usually ambulates with a RW.  Past Medical History:  Diagnosis Date  . Acute gallstone pancreatitis 01/08/2014  . AKI (acute kidney injury) ([facility]) 01/08/2014  . Anemia   . Blood transfusion    11'15 last admission  . Cholecystitis   . Hypercholesteremia   . Hypertension   . Right leg DVT ([place]) 05/08/2014  . Septic shock - Kelbsiella 01/09/2014  . Shortness of breath   . Stroke [facility])    '13- right side weakness, ambulates with cane    Past Surgical History:  Procedure Laterality Date  . ABDOMINAL HYSTERECTOMY    . ENDOSCOPIC RETROGRADE CHOLANGIOPANCREATOGRAPHY (ERCP) WITH PROPOFOL N/A 01/11/2014   Procedure: ENDOSCOPIC RETROGRADE CHOLANGIOPANCREATOGRAPHY (ERCP) WITH PROPOFOL;  Surgeon: Katelyn Mayer, MD;  Location: [facility];  Service: Endoscopy;  Laterality: N/A;  . ERCP    . ERCP N/A 05/26/2014   Procedure: ENDOSCOPIC RETROGRADE CHOLANGIOPANCREATOGRAPHY (ERCP);  Surgeon: Katelyn Mayer, MD;  Location: [facility];  Service: Endoscopy;  Laterality: N/A;  . ESOPHAGOGASTRODUODENOSCOPY (EGD) WITH PROPOFOL N/A 12/07/2014   Procedure: ESOPHAGOGASTRODUODENOSCOPY (EGD) WITH PROPOFOL;  Surgeon: Katelyn Banister, MD;  Location: [facility] ENDOSCOPY;  Service: Endoscopy;  Laterality: N/A;  . EUS N/A 12/07/2014   Procedure: UPPER ENDOSCOPIC ULTRASOUND (EUS) LINEAR;  Surgeon: Katelyn Banister, MD;  Location: [facility] ENDOSCOPY;  Service: Endoscopy;  Laterality: N/A;  . GASTROINTESTINAL STENT REMOVAL N/A  12/07/2014   Procedure: GASTROINTESTINAL STENT REMOVAL;  Surgeon: Katelyn Banister, MD;  Location: [facility] ENDOSCOPY;  Service: Endoscopy;  Laterality: N/A;  pancreatic stent removal  . TEE WITHOUT CARDIOVERSION  03/06/2011   Procedure: TRANSESOPHAGEAL ECHOCARDIOGRAM (TEE);  Surgeon: Katelyn Artist, MD;  Location: [facility] ENDOSCOPY;  Service: Cardiovascular;  Laterality: N/A;    Family History  Problem Relation Age of Onset  . Breast cancer Maternal Aunt   . Diabetes Mellitus I Father   . Diabetes Mellitus I Sister   . Anesthesia problems Neg Hx   . Hypotension Neg Hx   . Malignant hyperthermia Neg Hx   . Pseudochol deficiency Neg Hx   . Colon cancer Neg Hx   . Esophageal cancer Neg Hx   . Pancreatic cancer Neg Hx   . Kidney disease Neg Hx   . Liver disease Neg Hx     Social History:  reports that she quit smoking about 4 years ago. Her smoking use included cigarettes. She smoked 0.25 packs per day. She has never used smokeless tobacco. She reports that she does not drink alcohol or use drugs.  Allergies: No Known Allergies  Medications: I have reviewed the patient's current medications.  No results found for this or any previous visit (from the past 48 hour(s)).  Dg Chest 1 View  Result Date: 05/29/2017 CLINICAL DATA:  Pain following fall EXAM: CHEST  1 VIEW COMPARISON:  March 02, 2014 FINDINGS: No edema or consolidation. Heart size and pulmonary vascularity are normal. No adenopathy. There is aortic atherosclerosis. No pneumothorax.  No bone lesions. IMPRESSION: Aortic atherosclerosis. No edema or consolidation. No pneumothorax. Aortic Atherosclerosis (ICD10-I70.0). Electronically Signed   By: Katelyn [name] M.D.   On: 05/29/2017 11:41   Dg Hip Unilat W Or Wo Pelvis 2-3 Views Right  Result Date: 05/29/2017 CLINICAL DATA:  Pain following fall EXAM: DG HIP (WITH OR WITHOUT PELVIS) 2-3V RIGHT COMPARISON:  None. FINDINGS: Frontal pelvis as well as frontal and lateral right hip images  were obtained. There is an intertrochanteric fracture of the right femur with varus angulation at the fracture site. No other fracture. No dislocation. There is mild symmetric narrowing of both hip joints. IMPRESSION: Intertrochanteric femur fracture on the right with varus angulation at the fracture site. No other fracture. No dislocation. Mild symmetric narrowing of each hip joint. Electronically Signed   By: Katelyn [name] M.D.   On: 05/29/2017 11:40    Review of Systems  Constitutional: Negative for weight loss.  HENT: Negative for ear discharge, ear pain, hearing loss and tinnitus.   Eyes: Negative for blurred vision, double vision, photophobia and pain.  Respiratory: Negative for cough, sputum production and shortness of breath.   Cardiovascular: Negative for chest pain.  Gastrointestinal: Negative for abdominal pain, nausea and vomiting.  Genitourinary: Negative for dysuria, flank pain, frequency and urgency.  Musculoskeletal: Positive for joint pain (Right hip). Negative for back pain, falls, myalgias and neck pain.  Neurological: Negative for dizziness, tingling, sensory change, focal weakness, loss of consciousness and headaches.  Endo/Heme/Allergies: Does not bruise/bleed easily.  Psychiatric/Behavioral: Negative for depression, memory loss and substance abuse. The patient is not nervous/anxious.    Blood pressure (!) 150/97, pulse (!) 115, temperature 97.8 F (36.6 C), temperature source Oral, resp. rate 18, SpO2 96 %. Physical Exam  Constitutional: She appears well-developed and well-nourished. No distress.  HENT:  Head: Normocephalic and atraumatic.  Eyes: Conjunctivae are normal. Right eye exhibits no discharge. Left eye exhibits no discharge. No scleral icterus.  Neck: Normal range of motion.  Cardiovascular: Normal rate and regular rhythm.  Respiratory: Effort normal. No respiratory distress.  Musculoskeletal:  LLE No traumatic wounds, ecchymosis, or rash  Mod TTP  hip  No knee or ankle effusion  Knee stable to varus/ valgus and anterior/posterior stress  Sens DPN, SPN, TN intact  Motor EHL, ext, flex, evers 5/5  DP 2+, PT 1+, No significant edema  Neurological: She is alert.  Skin: Skin is warm and dry. She is not diaphoretic.  Psychiatric: She has a normal mood and affect. Her behavior is normal.    Assessment/Plan: Fall Right hip fx -- For IMN this afternoon by Dr. Stann Mainland. NPO until then. Tobacco use -- Encouraged cessation, especially during healing process Multiple medical problems -- Clearance for surgery. Home meds. Hospitalists to admit and manage.    Lisette Abu, PA-C Orthopedic Surgery [phone] 05/29/2017, 12:16 PM

## 2017-05-29 NOTE — Transfer of Care (Signed)
Immediate Anesthesia Transfer of Care Note  Patient: Katelyn [name]  Procedure(s) Performed: INTRAMEDULLARY (IM) NAIL INTERTROCHANTRIC FEMUR (Right Hip) PERCUTANEOUS TRACHEOSTOMY (N/A )  Patient Location: ICU  Anesthesia Type:Spinal  Level of Consciousness: drowsy and Patient remains intubated per anesthesia plan  Airway & Oxygen Therapy: Patient remains intubated per anesthesia plan and Patient placed on Ventilator (see vital sign flow sheet for setting)  Post-op Assessment: Report given to RN and Post -op Vital signs reviewed and stable  Post vital signs: Reviewed and stable  Last Vitals:  Vitals Value Taken Time  BP 108/80 05/29/2017  9:00 PM  Temp    Pulse 88 05/29/2017  8:59 PM  Resp 14 05/29/2017  9:00 PM  SpO2 99 % 05/29/2017  8:59 PM  Vitals shown include unvalidated device data.  Last Pain:  Vitals:   05/29/17 2045  TempSrc: Oral  PainSc:          Complications: No apparent anesthesia complications

## 2017-05-29 NOTE — Anesthesia Procedure Notes (Signed)
Spinal  Patient location during procedure: OR Start time: 05/29/2017 6:30 PM End time: 05/29/2017 6:40 PM Staffing Anesthesiologist: Murvin Natal, MD Performed: anesthesiologist  Preanesthetic Checklist Completed: patient identified, surgical consent, pre-op evaluation, timeout performed, IV checked, risks and benefits discussed and monitors and equipment checked Spinal Block Patient position: sitting Prep: DuraPrep Patient monitoring: cardiac monitor, continuous pulse ox and blood pressure Approach: right paramedian Location: L4-5 Injection technique: single-shot Needle Needle type: Quincke  Needle gauge: 22 G Needle length: 9 cm Assessment Sensory level: T10 Additional Notes Functioning IV was confirmed and monitors were applied. Sterile prep and drape, including hand hygiene and sterile gloves were used. The patient was positioned and the spine was prepped. The skin was anesthetized with lidocaine.  Free flow of clear CSF was obtained prior to injecting local anesthetic into the CSF.  The spinal needle aspirated freely following injection.  The needle was carefully withdrawn.  The patient tolerated the procedure well.

## 2017-05-30 DIAGNOSIS — L8990 Pressure ulcer of unspecified site, unspecified stage: Secondary | ICD-10-CM

## 2017-05-30 DIAGNOSIS — E440 Moderate protein-calorie malnutrition: Secondary | ICD-10-CM

## 2017-05-30 DIAGNOSIS — S72001D Fracture of unspecified part of neck of right femur, subsequent encounter for closed fracture with routine healing: Secondary | ICD-10-CM

## 2017-05-30 LAB — BASIC METABOLIC PANEL
ANION GAP: 14 (ref 5–15)
BUN: 23 mg/dL — AB (ref 6–20)
CO2: 25 mmol/L (ref 22–32)
Calcium: 8.7 mg/dL — ABNORMAL LOW (ref 8.9–10.3)
Chloride: 98 mmol/L — ABNORMAL LOW (ref 101–111)
Creatinine, Ser: 0.94 mg/dL (ref 0.44–1.00)
GFR calc Af Amer: >60 mL/min (ref >60)
Glucose, Bld: 98 mg/dL (ref 65–99)
POTASSIUM: 3.5 mmol/L (ref 3.5–5.1)
Sodium: 137 mmol/L (ref 135–145)

## 2017-05-30 LAB — CBC
HCT: 20.8 % — ABNORMAL LOW (ref 36.0–46.0)
Hemoglobin: 6.3 g/dL — CL (ref 12.0–15.0)
MCH: 25.4 pg — AB (ref 26.0–34.0)
MCHC: 30.3 g/dL (ref 30.0–36.0)
MCV: 83.9 fL (ref 78.0–100.0)
PLATELETS: 226 10*3/uL (ref 150–400)
RBC: 2.48 MIL/uL — AB (ref 3.87–5.11)
RDW: 16.7 % — ABNORMAL HIGH (ref 11.5–15.5)
WBC: 12.1 10*3/uL — ABNORMAL HIGH (ref 4.0–10.5)

## 2017-05-30 LAB — POCT I-STAT 3, ART BLOOD GAS (G3+)
ACID-BASE EXCESS: 4 mmol/L — AB (ref 0.0–2.0)
ACID-BASE EXCESS: 5 mmol/L — AB (ref 0.0–2.0)
Bicarbonate: 27.8 mmol/L (ref 20.0–28.0)
Bicarbonate: 30.2 mmol/L — ABNORMAL HIGH (ref 20.0–28.0)
O2 SAT: 100 %
O2 Saturation: 99 %
PCO2 ART: 47 mmHg (ref 32.0–48.0)
PH ART: 7.496 — AB (ref 7.350–7.450)
PO2 ART: 126 mmHg — AB (ref 83.0–108.0)
PO2 ART: 265 mmHg — AB (ref 83.0–108.0)
Patient temperature: 97.5
Patient temperature: 98.7
TCO2: 29 mmol/L (ref 22–32)
TCO2: 32 mmol/L (ref 22–32)
pCO2 arterial: 35.7 mmHg (ref 32.0–48.0)
pH, Arterial: 7.416 (ref 7.350–7.450)

## 2017-05-30 LAB — POCT I-STAT 3, VENOUS BLOOD GAS (G3P V)
ACID-BASE EXCESS: 2 mmol/L (ref 0.0–2.0)
BICARBONATE: 28.8 mmol/L — AB (ref 20.0–28.0)
O2 Saturation: 52 %
PO2 VEN: 29 mmHg — AB (ref 32.0–45.0)
Patient temperature: 98
TCO2: 30 mmol/L (ref 22–32)
pCO2, Ven: 52.3 mmHg (ref 44.0–60.0)
pH, Ven: 7.348 (ref 7.250–7.430)

## 2017-05-30 LAB — MAGNESIUM: Magnesium: 1.8 mg/dL (ref 1.7–2.4)

## 2017-05-30 LAB — GLUCOSE, CAPILLARY
GLUCOSE-CAPILLARY: 94 mg/dL (ref 65–99)
Glucose-Capillary: 88 mg/dL (ref 65–99)

## 2017-05-30 LAB — PHOSPHORUS: Phosphorus: 2.2 mg/dL — ABNORMAL LOW (ref 2.5–4.6)

## 2017-05-30 LAB — COOXEMETRY PANEL
Carboxyhemoglobin: 2.2 % — ABNORMAL HIGH (ref 0.5–1.5)
METHEMOGLOBIN: 0.9 % (ref 0.0–1.5)
O2 Saturation: 72.7 %
Total hemoglobin: 10.9 g/dL — ABNORMAL LOW (ref 12.0–16.0)

## 2017-05-30 LAB — PREPARE RBC (CROSSMATCH)

## 2017-05-30 MED ORDER — POTASSIUM CHLORIDE 20 MEQ/15ML (10%) PO SOLN
40.0000 meq | Freq: Once | ORAL | Status: AC
  Administered 2017-05-30: 40 meq
  Filled 2017-05-30: qty 30

## 2017-05-30 MED ORDER — SODIUM CHLORIDE 0.9 % IV SOLN: Freq: Once | INTRAVENOUS | Status: DC

## 2017-05-30 MED ORDER — POTASSIUM & SODIUM PHOSPHATES 280-160-250 MG PO PACK
1.0000 | PACK | Freq: Once | ORAL | Status: AC
  Administered 2017-05-30: 1 via ORAL
  Filled 2017-05-30: qty 1

## 2017-05-30 MED ORDER — MAGNESIUM SULFATE 2 GM/50ML IV SOLN
2.0000 g | Freq: Once | INTRAVENOUS | Status: AC
  Administered 2017-05-30: 2 g via INTRAVENOUS
  Filled 2017-05-30: qty 50

## 2017-05-30 MED ORDER — FENTANYL CITRATE (PF) 100 MCG/2ML IJ SOLN
50.0000 ug | INTRAMUSCULAR | Status: DC | PRN
  Administered 2017-05-30 – 2017-06-02 (×19): 50 ug via INTRAVENOUS
  Filled 2017-05-30 (×8): qty 2

## 2017-05-30 NOTE — Progress Notes (Signed)
   Subjective: 1 Day Post-Op Procedure(s) (LRB): INTRAMEDULLARY (IM) NAIL INTERTROCHANTRIC FEMUR (Right)  Pt awake and alert this morning  Responds to waves and voice s/p tracheostomy  Currently using mittens to protect herself Spoke with family about surgery last night and plan  Objective:   VITALS:   Vitals:   05/30/17 0758 05/30/17 0800  BP: 128/85 134/86  Pulse: (!) 105 97  Resp: 16 14  Temp: 98.1 F (36.7 C) 98.1 F (36.7 C)  SpO2: 100% 100%    Right hip incision in good position No signs of drainage or erythema Cap refill less than 2 seconds distally No rashes or edema distally  LABS Recent Labs    05/29/17 1143 05/29/17 2100 05/30/17 0341  HGB 8.4* 7.2* 6.3*  HCT 28.9* 25.4* 20.8*  WBC 11.7* 17.5* 12.1*  PLT 322 273 226    Recent Labs    05/29/17 1143 05/29/17 2100 05/30/17 0341  NA 138 139 137  K 3.7 4.2 3.5  BUN 19 20 23*  CREATININE 0.80 0.92 0.94  GLUCOSE 105* 145* 98     Assessment/Plan: 1 Day Post-Op Procedure(s) (LRB): INTRAMEDULLARY (IM) NAIL INTERTROCHANTRIC FEMUR (Right) Currently receiving blood for acute blood loss anemia, currently at 6.3/20.8 Surgery went well last pm Will continue to monitor her progress and dressing Family in agreement    Merla Riches PA-C, [facility] [facility] [address] [place], [place], [place] [address] Phone: [phone] [email or web address] Facebook  [employer]
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
## 2017-05-30 NOTE — Progress Notes (Signed)
SLP Cancellation Note  Patient Details Name: Katelyn [name] MRN: [id number] DOB: [birth year]   Cancelled treatment:       Reason Eval/Treat Not Completed: Other (comment). Swallow orders received; chart reviewed and pt currently on ATC. Please order PMSV if appropriate.   Deneise Lever, [place], [place] Speech-Language Pathologist [place] 05/30/2017, 3:27 PM

## 2017-05-30 NOTE — Progress Notes (Signed)
[place] Progress Note Patient Name: Katelyn [name] DOB: [birth year] MRN: [id number]   Date of Service  05/30/2017    Pain - Fentanyl 50 mcg IV Q 2 hours not effective.   eICU Interventions  Will increase Fentanyl 50 mcg IV to Q 1 hour PRN pain.      Intervention Category Intermediate Interventions: Pain - evaluation and management  Meher Kucinski [place] 05/30/2017, 8:00 PM

## 2017-05-30 NOTE — Progress Notes (Signed)
1 Day Post-Op   Subjective/Chief Complaint: No issues Blood ordered for low hgb  Objective: Vital signs in last 24 hours: Temp:  [97.4 F (36.3 C)-98.3 F (36.8 C)] 98.1 F (36.7 C) (04/06 0924) Pulse Rate:  [55-113] 81 (04/06 1114) Resp:  [13-27] 13 (04/06 1114) BP: (82-167)/(63-104) 141/86 (04/06 1114) SpO2:  [80 %-100 %] 99 % (04/06 1114) FiO2 (%):  [28 %-50 %] 28 % (04/06 1114) Weight:  [37.1 kg (81 lb 12.7 oz)] 37.1 kg (81 lb 12.7 oz) (04/05 2100) Last BM Date: (pta)  Intake/Output from previous day: 04/05 0701 - 04/06 0700 In: 1158.3 [I.V.:988.3; NG/GT:120; IV Piggyback:50] Out: 50 [Blood:50] Intake/Output this shift: Total I/O In: 982.5 [I.V.:225; Blood:657.5; IV Piggyback:100] Out: -   Resting comfortably nad On TC Trach site ok cta  Lab Results:  Recent Labs    05/29/17 2100 05/30/17 0341  WBC 17.5* 12.1*  HGB 7.2* 6.3*  HCT 25.4* 20.8*  PLT 273 226   BMET Recent Labs    05/29/17 2100 05/30/17 0341  NA 139 137  K 4.2 3.5  CL 99* 98*  CO2 28 25  GLUCOSE 145* 98  BUN 20 23*  CREATININE 0.92 0.94  CALCIUM 9.0 8.7*   PT/INR Recent Labs    05/29/17 1526 05/29/17 2100  LABPROT 14.6 14.6  INR 1.14 1.15   ABG Recent Labs    05/29/17 2350 05/30/17 0447  PHART 7.416 7.496*  HCO3 30.2* 27.8    Studies/Results: Dg Chest 1 View  Result Date: 05/29/2017 CLINICAL DATA:  Pain following fall EXAM: CHEST  1 VIEW COMPARISON:  March 02, 2014 FINDINGS: No edema or consolidation. Heart size and pulmonary vascularity are normal. No adenopathy. There is aortic atherosclerosis. No pneumothorax. No bone lesions. IMPRESSION: Aortic atherosclerosis. No edema or consolidation. No pneumothorax. Aortic Atherosclerosis (ICD10-I70.0). Electronically Signed   By: Lowella Grip III M.D.   On: 05/29/2017 11:41   Ct Hip Right Wo Contrast  Result Date: 05/29/2017 CLINICAL DATA:  Right hip pain since a fall 3 days ago. Initial encounter. EXAM: CT OF THE RIGHT  HIP WITHOUT CONTRAST TECHNIQUE: Multidetector CT imaging of the right hip was performed according to the standard protocol. Multiplanar CT image reconstructions were also generated. COMPARISON:  Plain films right hip 05/29/2017. FINDINGS: Bones/Joint/Cartilage As seen on the comparison plain films, the patient has an acute right intertrochanteric fracture. Impaction at the medial margin of the fracture of approximately 1 cm is identified. Mild varus angulation is noted. No finding to suggest pathologic fracture is identified. The right femoral head is located. Ligaments Suboptimally assessed by CT. Muscles and Tendons Intact. Soft tissues Imaged intrapelvic contents demonstrate no acute abnormality. IMPRESSION: Acute right intertrochanteric fracture as described above. No other acute abnormality. Electronically Signed   By: Inge Rise M.D.   On: 05/29/2017 13:45   Dg Chest Port 1 View  Result Date: 05/29/2017 CLINICAL DATA:  Respiratory failure.  Tracheostomy check. EXAM: PORTABLE CHEST 1 VIEW COMPARISON:  05/29/2017 at 11:02 a.m. FINDINGS: Tracheostomy has been placed since the earlier exam. Tip projects in the upper thoracic trachea. Cardiac silhouette is normal in size. No mediastinal or hilar masses. Lungs clear allowing for the supine positioning. IMPRESSION: 1. Well-positioned tracheostomy tube. 2. No acute cardiopulmonary disease. Electronically Signed   By: Lajean Manes M.D.   On: 05/29/2017 21:18   Dg Abd Portable 1v  Result Date: 05/30/2017 CLINICAL DATA:  NG tube placement EXAM: PORTABLE ABDOMEN - 1 VIEW COMPARISON:  None. FINDINGS: Enteric  tube tip is in the left upper quadrant consistent with location in the upper stomach. Visualized lung bases are clear. Gas and stool throughout the colon. Visualized bowel are not abnormally dilated. IMPRESSION: Enteric tube tip is in the left upper quadrant consistent with location in the upper stomach. Electronically Signed   By: Lucienne Capers M.D.    On: 05/30/2017 00:47   Dg C-arm 1-60 Min  Result Date: 05/29/2017 CLINICAL DATA:  Right femoral intramedullary nail fixation. EXAM: RIGHT FEMUR 2 VIEWS; DG C-ARM 61-120 MIN COMPARISON:  Preoperative CT and radiographs from the same day FINDINGS: A total of 48 seconds of fluoroscopic time was utilized for placement of right femoral nail across an intertrochanteric fracture of the right femur. Fine bony detail is limited on the 4 images acquired from the fluoroscopic technique. No intraoperative nor immediate postoperative complications are identified. IMPRESSION: New intramedullary nail fixation of intertrochanteric fracture of the right femur without immediate complications identified. Electronically Signed   By: Ashley Royalty M.D.   On: 05/29/2017 19:45   Dg Hip Unilat W Or Wo Pelvis 2-3 Views Right  Result Date: 05/29/2017 CLINICAL DATA:  Pain following fall EXAM: DG HIP (WITH OR WITHOUT PELVIS) 2-3V RIGHT COMPARISON:  None. FINDINGS: Frontal pelvis as well as frontal and lateral right hip images were obtained. There is an intertrochanteric fracture of the right femur with varus angulation at the fracture site. No other fracture. No dislocation. There is mild symmetric narrowing of both hip joints. IMPRESSION: Intertrochanteric femur fracture on the right with varus angulation at the fracture site. No other fracture. No dislocation. Mild symmetric narrowing of each hip joint. Electronically Signed   By: Lowella Grip III M.D.   On: 05/29/2017 11:40   Dg Femur, Min 2 Views Right  Result Date: 05/29/2017 CLINICAL DATA:  Right femoral intramedullary nail fixation. EXAM: RIGHT FEMUR 2 VIEWS; DG C-ARM 61-120 MIN COMPARISON:  Preoperative CT and radiographs from the same day FINDINGS: A total of 48 seconds of fluoroscopic time was utilized for placement of right femoral nail across an intertrochanteric fracture of the right femur. Fine bony detail is limited on the 4 images acquired from the fluoroscopic  technique. No intraoperative nor immediate postoperative complications are identified. IMPRESSION: New intramedullary nail fixation of intertrochanteric fracture of the right femur without immediate complications identified. Electronically Signed   By: Ashley Royalty M.D.   On: 05/29/2017 19:45    Anti-infectives: Anti-infectives (From admission, onward)   Start     Dose/Rate Route Frequency Ordered Stop   05/30/17 0030  ceFAZolin (ANCEF) IVPB 1 g/50 mL premix     1 g 100 mL/hr over 30 Minutes Intravenous Every 6 hours 05/29/17 2100 05/30/17 1829   05/29/17 1530  ceFAZolin (ANCEF) IVPB 2g/100 mL premix     2 g 200 mL/hr over 30 Minutes Intravenous To Surgery 05/29/17 1525 05/29/17 1838      Assessment/Plan: s/p Procedure(s): INTRAMEDULLARY (IM) NAIL INTERTROCHANTRIC FEMUR (Right)  S/p emergent trach 4/5 Dr Hulen Skains for obstructing epiglottic mass Trach collar as tolerated Trach care Will need outpt evaluation by ENT for epiglottic mass Can dc ng from our perspective  Leighton Ruff. Redmond Pulling, MD, FACS General, Bariatric, & Minimally Invasive Surgery [facility], [place]   LOS: 1 day    Greer Pickerel 05/30/2017

## 2017-05-30 NOTE — Evaluation (Addendum)
Passy-Muir Speaking Valve - Evaluation Patient Details  Name: Katelyn [name] MRN: [id number] Date of Birth: [birth year]  Today's Date: 05/30/2017 Time: 1720-1735 SLP Time Calculation (min) (ACUTE ONLY): 15 min  Past Medical History:  Past Medical History:  Diagnosis Date  . Acute gallstone pancreatitis 01/08/2014  . AKI (acute kidney injury) ([place]) 01/08/2014  . Anemia   . Blood transfusion    11'15 last admission  . Cholecystitis   . Hypercholesteremia   . Hypertension   . Right leg DVT ([place]) 05/08/2014  . Septic shock - Kelbsiella 01/09/2014  . Shortness of breath   . Stroke [facility])    '13- right side weakness, ambulates with cane   Past Surgical History:  Past Surgical History:  Procedure Laterality Date  . ABDOMINAL HYSTERECTOMY    . ENDOSCOPIC RETROGRADE CHOLANGIOPANCREATOGRAPHY (ERCP) WITH PROPOFOL N/A 01/11/2014   Procedure: ENDOSCOPIC RETROGRADE CHOLANGIOPANCREATOGRAPHY (ERCP) WITH PROPOFOL;  Surgeon: Gatha Mayer, MD;  Location: [place];  Service: Endoscopy;  Laterality: N/A;  . ERCP    . ERCP N/A 05/26/2014   Procedure: ENDOSCOPIC RETROGRADE CHOLANGIOPANCREATOGRAPHY (ERCP);  Surgeon: Gatha Mayer, MD;  Location: [facility];  Service: Endoscopy;  Laterality: N/A;  . ESOPHAGOGASTRODUODENOSCOPY (EGD) WITH PROPOFOL N/A 12/07/2014   Procedure: ESOPHAGOGASTRODUODENOSCOPY (EGD) WITH PROPOFOL;  Surgeon: Milus Banister, MD;  Location: [facility] ENDOSCOPY;  Service: Endoscopy;  Laterality: N/A;  . EUS N/A 12/07/2014   Procedure: UPPER ENDOSCOPIC ULTRASOUND (EUS) LINEAR;  Surgeon: Milus Banister, MD;  Location: [facility] ENDOSCOPY;  Service: Endoscopy;  Laterality: N/A;  . GASTROINTESTINAL STENT REMOVAL N/A 12/07/2014   Procedure: GASTROINTESTINAL STENT REMOVAL;  Surgeon: Milus Banister, MD;  Location: [facility] ENDOSCOPY;  Service: Endoscopy;  Laterality: N/A;  pancreatic stent removal  . TEE WITHOUT CARDIOVERSION  03/06/2011   Procedure: TRANSESOPHAGEAL ECHOCARDIOGRAM (TEE);  Surgeon:  Jolaine Artist, MD;  Location: [facility] ENDOSCOPY;  Service: Cardiovascular;  Laterality: N/A;   HPI:  34yoF with hx CVA, DVT (2016), HTN, Anemia, Gallstone pancreatitis, and Epiglottis mass, who presented with a hip fracture, now s/p operative repair. Intubated for procedure; postop patient developed respiratory distress and required emergent trach due to obstructing epiglottic mass. Epiglottic lesion first noted during prior intubation in April 2016.     Assessment / Plan / Recommendation Clinical Impression   Patient presents with inability to tolerate PMSV due to decreased upper airway patency, as evidenced by inability to redirect air through upper airway with finger occlusion of trach. Valve placed for single breath cycle, with immediate displacement. Given obstructing epiglottic mass do not anticipate significant improvements in valve tolerance without intervention. Recommend ENT consult. Will defer swallowing evaluation at this time; per daughter pt has had significant weight loss. Education provided re: swallow physiology and risks for aspiration; continue NPO with NG. Pt to wear PMSV with SLP only. Will continue to follow.        SLP Visit Diagnosis: Aphonia (R49.1)    SLP Assessment  Patient needs continued Speech Lanaguage Pathology Services    Follow Up Recommendations  Other (comment)(tbd)    Frequency and Duration min 2x/week  2 weeks    PMSV Trial PMSV was placed for: 1 breath cycle Able to redirect subglottic air through upper airway: No Able to Attain Phonation: No Voice Quality: Other (comment)(not observed) Able to Expectorate Secretions: No attempts Level of Secretion Expectoration with PMSV: Not observed Breath Support for Phonation: Other (comment)(UTA) Intelligibility: Unable to assess (comment) Respirations During Trial: 21 SpO2 During Trial: 99 % Pulse During Trial: 85  Behavior: Alert;Anxious;Cooperative   Tracheostomy Tube  Additional Tracheostomy Tube  Assessment Trach Collar Period: 9 hours Secretion Description: none observed Frequency of Tracheal Suctioning: infrequent Level of Secretion Expectoration: Other (comment)(not observed)    Vent Dependency  FiO2 (%): 28 %    Cuff Deflation Trial  GO Tolerated Cuff Deflation: Yes Length of Time for Cuff Deflation Trial: 15 min Behavior: Alert;Anxious;Cooperative        Aliene Altes 05/30/2017, 7:36 PM   Deneise Lever, [place], [place] Speech-Language Pathologist [phone]

## 2017-05-30 NOTE — Progress Notes (Signed)
AM ABG with respiratory alkalosis; asked RN to turn Vt down from 410 to 350 and communicate the change to RT. Check ABG again later today.

## 2017-05-30 NOTE — Anesthesia Postprocedure Evaluation (Signed)
Anesthesia Post Note  Patient: Katelyn [name]  Procedure(s) Performed: INTRAMEDULLARY (IM) NAIL INTERTROCHANTRIC FEMUR (Right Hip) PERCUTANEOUS TRACHEOSTOMY (N/A )     Patient location during evaluation: ICU Anesthesia Type: Spinal Level of consciousness: patient cooperative and responds to stimulation Pain management: pain level controlled Vital Signs Assessment: post-procedure vital signs reviewed and stable Respiratory status: nonlabored ventilation, spontaneous breathing and respiratory function stable (tracheostomy intact) Cardiovascular status: stable Postop Assessment: no apparent nausea or vomiting Anesthetic complications: no Comments: Patient evaluated in ICU 25 with the family at bedside. No distress noted. Respiratory status significantly improved.    Last Vitals:  Vitals:   05/30/17 0605 05/30/17 0720  BP: 138/80 128/85  Pulse: 100 86  Resp: 15 14  Temp: 36.7 C   SpO2: 99% 100%    Last Pain:  Vitals:   05/30/17 0605  TempSrc: Axillary  PainSc:                  [name]

## 2017-05-30 NOTE — Progress Notes (Signed)
[place] Progress Note Patient Name: [name] DOB: 21-Jun-1950 MRN: [id number]   Date of Service  05/30/2017  HPI/Events of Note  S/p hip repair.  Hgb 6.  Pre-op hgb 8.  Site looks good.  Minimal EBL.  eICU Interventions  transfuse 2 units PRBC     Intervention Category Intermediate Interventions: Other:  Mauri [place], P 05/30/2017, 4:46 AM

## 2017-05-30 NOTE — Progress Notes (Signed)
[facility]                                                                              Patient Demographics  Katelyn [name], is a 67 y.o. female, DOB - [birth year], MOL:[id number]  Admit date - 05/29/2017   Admitting Physician Nicholes Stairs, MD  Outpatient Primary MD for the patient is Rankins, Bill Salinas, MD  Outpatient specialists:   LOS - 1  days    Chief Complaint  Patient presents with  . Hip Pain       Brief summary  Katelyn [name] is a 67 y.o. female with medical history significant  Stroke, DVT not on anticoagulation, a side weakness, hypertension, gallstone pancreatitis, tobacco use who was admitted for right hip fracture status post fall. She was admitted and taken to the OR for intramedullary nail intertrochanteric femur.  Postoperatively patient required emergent trach on 05/29/2017 by Dr. Hulen Skains for obstructing epiglottic mass.    Assessment & Plan    Principal Problem:   Closed right hip fracture (HCC) Active Problems:   Hypercholesteremia   Essential hypertension   Ataxia following cerebral infarction   CKD (chronic kidney disease) stage 3, GFR 30-59 ml/min (HCC)   Diastolic dysfunction   Tobacco abuse   Anemia   Lesion of epiglottis   Dysphagia   Weight loss   Difficult airway for intubation   Pressure injury of skin  #1 right hip fracture: Status post intramedullary nail intertrochanteric femur Pain management Rehabilitation Supportive care  #2 anemia: Postoperative PRBC transfusion ordered Follow-up on hematological indicis  #3 CKD 3: Monitor renal function with electrolytes  #4 hypertension: Blood pressure control  #5 history of dysphagia with epiglotic glottic lesion s/p  emergent tracheostomy: For obstructing epiglottic mass Routine trach care Speech/nutrition consult follow-up Outpatient ENT evaluation for epiglottic mass on discharge  Code Status: Full code DVT Prophylaxis: SCD's Family  Communication: Discussed in detail with the patient, all imaging results, lab results explained to the patient   Disposition Plan: SNF vs LTAC  Time Spent in minutes  35 minutes  Procedures:  Right hip surgery 05/29/2017  emergent tracheostomy 05/29/2017  Consultants:   Orthopedist Trauma surgery Antimicrobials:      Medications  Scheduled Meds: . chlorhexidine gluconate (MEDLINE KIT)  15 mL Mouth Rinse BID  . chlorhexidine gluconate (MEDLINE KIT)  15 mL Mouth Rinse BID  . docusate  100 mg Per Tube BID  . heparin  5,000 Units Subcutaneous Q8H  . mouth rinse  15 mL Mouth Rinse QID  . mouth rinse  15 mL Mouth Rinse QID  . pantoprazole (PROTONIX) IV  40 mg Intravenous QHS   Continuous Infusions: . sodium chloride    . sodium chloride    .  ceFAZolin (ANCEF) IV Stopped (05/30/17 0705)  . lactated ringers 75 mL/hr at 05/30/17 0820   PRN Meds:.sodium chloride, acetaminophen (TYLENOL) oral liquid 160 mg/5 mL **OR** acetaminophen, acetaminophen, albuterol, fentaNYL (SUBLIMAZE) injection, fentaNYL (SUBLIMAZE) injection, hydrALAZINE, HYDROcodone-acetaminophen, midazolam, midazolam, ondansetron (ZOFRAN) IV   Antibiotics   Anti-infectives (From admission, onward)   Start     Dose/Rate Route Frequency Ordered Stop   05/30/17  0030  ceFAZolin (ANCEF) IVPB 1 g/50 mL premix     1 g 100 mL/hr over 30 Minutes Intravenous Every 6 hours 05/29/17 2100 05/30/17 1829   05/29/17 1530  ceFAZolin (ANCEF) IVPB 2g/100 mL premix     2 g 200 mL/hr over 30 Minutes Intravenous To Surgery 05/29/17 1525 05/29/17 1838        Subjective:   Kajsa Butrum was seen and examined today. Patient denies dizziness, chest pain, shortness of breath, she is liberated from the ventilator  Objective:   Vitals:   05/30/17 0813 05/30/17 0830 05/30/17 0900 05/30/17 0924  BP: (!) 151/104  140/88 (!) 152/93  Pulse: 95 (!) 103 96 (!) 103  Resp: (!) '23 16 17 19  ' Temp: 98.3 F (36.8 C)   98.1 F (36.7 C)    TempSrc: Oral   Oral  SpO2: 100% 98% 99% 100%  Weight:      Height:        Intake/Output Summary (Last 24 hours) at 05/30/2017 1003 Last data filed at 05/30/2017 3833 Gross per 24 hour  Intake 1990.83 ml  Output 50 ml  Net 1940.83 ml     Wt Readings from Last 3 Encounters:  05/29/17 37.1 kg (81 lb 12.7 oz)  12/07/14 55.3 kg (122 lb)  12/01/14 55.4 kg (122 lb 3.2 oz)     Exam  General: NAD  HEENT:  NCAT,  PERRL,MMM  Neck: Clinically noted SUPPLE, (-) JVD  Cardiovascular: RRR, (-) GALLOP, (-) MURMUR  Respiratory: CTA  Gastrointestinal: SOFT, (-) DISTENSION, BS(+), (_) TENDERNESS  Ext: (-) CYANOSIS, (-) EDEMA  Neuro: A, OX 3  Skin:(-) RASH  Psych:NORMAL AFFECT/MOOD   Data Reviewed:  I have personally reviewed following labs and imaging studies  Micro Results Recent Results (from the past 240 hour(s))  MRSA PCR Screening     Status: None   Collection Time: 05/29/17  9:02 PM  Result Value Ref Range Status   MRSA by PCR NEGATIVE NEGATIVE Final    Comment:        The GeneXpert MRSA Assay (FDA approved for NASAL specimens only), is one component of a comprehensive MRSA colonization surveillance program. It is not intended to diagnose MRSA infection nor to guide or monitor treatment for MRSA infections. Performed at [facility], [place] [address] [place], [place] [address]     Radiology Reports Dg Chest 1 View  Result Date: 05/29/2017 CLINICAL DATA:  Pain following fall EXAM: CHEST  1 VIEW COMPARISON:  March 02, 2014 FINDINGS: No edema or consolidation. Heart size and pulmonary vascularity are normal. No adenopathy. There is aortic atherosclerosis. No pneumothorax. No bone lesions. IMPRESSION: Aortic atherosclerosis. No edema or consolidation. No pneumothorax. Aortic Atherosclerosis (ICD10-I70.0). Electronically Signed   By: Lowella Grip III M.D.   On: 05/29/2017 11:41   Ct Hip Right Wo Contrast  Result Date: 05/29/2017 CLINICAL DATA:  Right hip  pain since a fall 3 days ago. Initial encounter. EXAM: CT OF THE RIGHT HIP WITHOUT CONTRAST TECHNIQUE: Multidetector CT imaging of the right hip was performed according to the standard protocol. Multiplanar CT image reconstructions were also generated. COMPARISON:  Plain films right hip 05/29/2017. FINDINGS: Bones/Joint/Cartilage As seen on the comparison plain films, the patient has an acute right intertrochanteric fracture. Impaction at the medial margin of the fracture of approximately 1 cm is identified. Mild varus angulation is noted. No finding to suggest pathologic fracture is identified. The right femoral head is located. Ligaments Suboptimally assessed by CT.  Muscles and Tendons Intact. Soft tissues Imaged intrapelvic contents demonstrate no acute abnormality. IMPRESSION: Acute right intertrochanteric fracture as described above. No other acute abnormality. Electronically Signed   By: Inge Rise M.D.   On: 05/29/2017 13:45   Dg Chest Port 1 View  Result Date: 05/29/2017 CLINICAL DATA:  Respiratory failure.  Tracheostomy check. EXAM: PORTABLE CHEST 1 VIEW COMPARISON:  05/29/2017 at 11:02 a.m. FINDINGS: Tracheostomy has been placed since the earlier exam. Tip projects in the upper thoracic trachea. Cardiac silhouette is normal in size. No mediastinal or hilar masses. Lungs clear allowing for the supine positioning. IMPRESSION: 1. Well-positioned tracheostomy tube. 2. No acute cardiopulmonary disease. Electronically Signed   By: Lajean Manes M.D.   On: 05/29/2017 21:18   Dg Abd Portable 1v  Result Date: 05/30/2017 CLINICAL DATA:  NG tube placement EXAM: PORTABLE ABDOMEN - 1 VIEW COMPARISON:  None. FINDINGS: Enteric tube tip is in the left upper quadrant consistent with location in the upper stomach. Visualized lung bases are clear. Gas and stool throughout the colon. Visualized bowel are not abnormally dilated. IMPRESSION: Enteric tube tip is in the left upper quadrant consistent with location in  the upper stomach. Electronically Signed   By: Lucienne Capers M.D.   On: 05/30/2017 00:47   Dg C-arm 1-60 Min  Result Date: 05/29/2017 CLINICAL DATA:  Right femoral intramedullary nail fixation. EXAM: RIGHT FEMUR 2 VIEWS; DG C-ARM 61-120 MIN COMPARISON:  Preoperative CT and radiographs from the same day FINDINGS: A total of 48 seconds of fluoroscopic time was utilized for placement of right femoral nail across an intertrochanteric fracture of the right femur. Fine bony detail is limited on the 4 images acquired from the fluoroscopic technique. No intraoperative nor immediate postoperative complications are identified. IMPRESSION: New intramedullary nail fixation of intertrochanteric fracture of the right femur without immediate complications identified. Electronically Signed   By: Ashley Royalty M.D.   On: 05/29/2017 19:45   Dg Hip Unilat W Or Wo Pelvis 2-3 Views Right  Result Date: 05/29/2017 CLINICAL DATA:  Pain following fall EXAM: DG HIP (WITH OR WITHOUT PELVIS) 2-3V RIGHT COMPARISON:  None. FINDINGS: Frontal pelvis as well as frontal and lateral right hip images were obtained. There is an intertrochanteric fracture of the right femur with varus angulation at the fracture site. No other fracture. No dislocation. There is mild symmetric narrowing of both hip joints. IMPRESSION: Intertrochanteric femur fracture on the right with varus angulation at the fracture site. No other fracture. No dislocation. Mild symmetric narrowing of each hip joint. Electronically Signed   By: Lowella Grip III M.D.   On: 05/29/2017 11:40   Dg Femur, Min 2 Views Right  Result Date: 05/29/2017 CLINICAL DATA:  Right femoral intramedullary nail fixation. EXAM: RIGHT FEMUR 2 VIEWS; DG C-ARM 61-120 MIN COMPARISON:  Preoperative CT and radiographs from the same day FINDINGS: A total of 48 seconds of fluoroscopic time was utilized for placement of right femoral nail across an intertrochanteric fracture of the right femur. Fine  bony detail is limited on the 4 images acquired from the fluoroscopic technique. No intraoperative nor immediate postoperative complications are identified. IMPRESSION: New intramedullary nail fixation of intertrochanteric fracture of the right femur without immediate complications identified. Electronically Signed   By: Ashley Royalty M.D.   On: 05/29/2017 19:45    Lab Data:  CBC: Recent Labs  Lab 05/29/17 1143 05/29/17 2100 05/30/17 0341  WBC 11.7* 17.5* 12.1*  NEUTROABS 9.7* 16.2*  --   HGB 8.4*  7.2* 6.3*  HCT 28.9* 25.4* 20.8*  MCV 83.0 84.9 83.9  PLT 322 273 631   Basic Metabolic Panel: Recent Labs  Lab 05/29/17 1143 05/29/17 2100 05/30/17 0341  NA 138 139 137  K 3.7 4.2 3.5  CL 98* 99* 98*  CO2 '28 28 25  ' GLUCOSE 105* 145* 98  BUN 19 20 23*  CREATININE 0.80 0.92 0.94  CALCIUM 9.6 9.0 8.7*  MG  --  1.9 1.8  PHOS  --  5.0* 2.2*   GFR: Estimated Creatinine Clearance: 31.2 mL/min (by C-G formula based on SCr of 0.94 mg/dL). Liver Function Tests: Recent Labs  Lab 05/29/17 1143 05/29/17 2100  AST 19 30  ALT 13* 14  ALKPHOS 72 66  BILITOT 0.8 0.5  PROT 7.9 7.1  ALBUMIN 2.6* 2.3*   No results for input(s): LIPASE, AMYLASE in the last 168 hours. No results for input(s): AMMONIA in the last 168 hours. Coagulation Profile: Recent Labs  Lab 05/29/17 1526 05/29/17 2100  INR 1.14 1.15   Cardiac Enzymes: No results for input(s): CKTOTAL, CKMB, CKMBINDEX, TROPONINI in the last 168 hours. BNP (last 3 results) No results for input(s): PROBNP in the last 8760 hours. HbA1C: No results for input(s): HGBA1C in the last 72 hours. CBG: Recent Labs  Lab 05/30/17 0404  GLUCAP 88   Lipid Profile: No results for input(s): CHOL, HDL, LDLCALC, TRIG, CHOLHDL, LDLDIRECT in the last 72 hours. Thyroid Function Tests: No results for input(s): TSH, T4TOTAL, FREET4, T3FREE, THYROIDAB in the last 72 hours. Anemia Panel: Recent Labs    05/29/17 1526  VITAMINB12 824  FOLATE  3.6*  FERRITIN 410*  TIBC 210*  IRON 12*  RETICCTPCT 2.0   Urine analysis:    Component Value Date/Time   COLORURINE AMBER (A) 01/07/2014 2323   APPEARANCEUR CLOUDY (A) 01/07/2014 2323   LABSPEC 1.022 01/07/2014 2323   PHURINE 6.0 01/07/2014 2323   GLUCOSEU NEGATIVE 01/07/2014 2323   HGBUR NEGATIVE 01/07/2014 2323   HGBUR trace-lysed 12/30/2006 0956   BILIRUBINUR SMALL (A) 01/07/2014 2323   KETONESUR 15 (A) 01/07/2014 2323   PROTEINUR NEGATIVE 01/07/2014 2323   UROBILINOGEN 4.0 (H) 01/07/2014 2323   NITRITE NEGATIVE 01/07/2014 2323   LEUKOCYTESUR NEGATIVE 01/07/2014 2323     Benito Mccreedy M.D. [facility] 05/30/2017, 10:03 AM  Pager: [phone] Between 7am to 7pm - call Pager - [phone]  After 7pm go to [email or web address] - password [email or web address]  Call night coverage person covering after 7pm
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
## 2017-05-30 NOTE — Progress Notes (Signed)
Pt placed on ATC at this time, tolerating well.  Pt awake & alert, no distress noted. RT will monitor

## 2017-05-30 NOTE — Progress Notes (Signed)
Initial Nutrition Assessment  DOCUMENTATION CODES:   Non-severe (moderate) malnutrition in context of chronic illness  INTERVENTION:  - Will monitor for diet advancement or other nutrition related plan. - Will provide warranted/appropriate interventions based on plan.  Monitor magnesium, potassium, and phosphorus daily for at least 3 days, MD to replete as needed, as pt is at risk for refeeding syndrome given poor PO intakes for extended amount of time, current hypophosphatemia.   NUTRITION DIAGNOSIS:   Moderate Malnutrition related to chronic illness(esophageal lesion with associated restricted PO intakes) as evidenced by mild fat depletion, mild muscle depletion.  GOAL:   Patient will meet greater than or equal to 90% of their needs  MONITOR:   Diet advancement, Weight trends, Labs, Skin  REASON FOR ASSESSMENT:   Malnutrition Screening Tool  ASSESSMENT:   67 year old female with PMH of CVA, DVT, HTN, epiglottic lesion. At baseline she uses Pampers, requires assistance feeding and has chronic right-sided weakness secondary to CVA, non-compliant with walker. She presented to the ED on 4/5 with R hip pain s/p fall and was subsequently taken to the OR for Intramedullary Nail Intertrochanteric Femur. She tolerated procedure well and was taken to PACU. In PACU patient was noted to be obtunded and hypoxic with saturation 70-80 despite supplemental oxygen; bagging attempted. Taken back to the OR for intubation due to epiglottic mass. Unable to place ETT so tracheostomy was placed.   BMI indicates normal weight. Pt POD #1 and is now off vent, on trach collar. NGT in place in L nare and is clamped. RD spoke with Dr. Redmond Pulling who reports that NGT can likely be removed today. Pt unable to be understood at this time but she was able to shake her head to indicate that she is not experiencing abdominal pain or nausea at this time.   A sister was briefly at bedside during RD visit. She reports  that pt's intakes have been very poor for an unknown amount of time but that it was certainly >1 month. She also reports pt has lost "a lot" of weight but she is unsure of pt's UBW or amount of weight that was lost. She is also unsure of time frame for weight loss. Sister was then unavailable for any additional nutrition-related questions.   NFPE outlined below. Per chart review, pt weighed 122 lbs on 12/07/14. Based on current weight, she has lost 41 lbs (34% body weight) in the past 2.5 years. This is not significant for that time frame but unable to determine if weight loss has been more acute.   Medications reviewed; 100 mg Colace per NGT BID, 2 g IV Mg sulfate x1 run today, 1 packet Phos-Nak per NGT x1 dose today, 40 mEq KCl per NGT x1 dose today.  Labs reviewed; CBG: 88 mg/dL today, Cl: 98 mmol/L, BUN: 23 mg/dL, Ca: 8.7 mg/dL, Phos: 2.2 mg/dL.  IVF: LR @ 75 mL/hr.       NUTRITION - FOCUSED PHYSICAL EXAM:    Most Recent Value  Orbital Region  No depletion  Upper Arm Region  Mild depletion  Thoracic and Lumbar Region  Unable to assess  Buccal Region  Mild depletion  Temple Region  Mild depletion  Clavicle Bone Region  Mild depletion  Clavicle and Acromion Bone Region  Mild depletion  Scapular Bone Region  Unable to assess  Dorsal Hand  Unable to assess [mittens]  Patellar Region  Mild depletion  Anterior Thigh Region  Mild depletion  Posterior Calf Region  Mild  depletion  Edema (RD Assessment)  None  Hair  Reviewed  Eyes  Reviewed  Mouth  Reviewed  Skin  Reviewed  Nails  Reviewed       Diet Order:  Diet NPO time specified Except for: Sips with Meds  EDUCATION NEEDS:   No education needs have been identified at this time  Skin:  Skin Assessment: Skin Integrity Issues: Skin Integrity Issues:: Stage II, Incisions Stage II: sacrum Incisions: R hip and neck 4/5  Last BM:  PTA/unknown  Height:   Ht Readings from Last 1 Encounters:  05/29/17 4\' 7"  (1.397 m)     Weight:   Wt Readings from Last 1 Encounters:  05/29/17 81 lb 12.7 oz (37.1 kg)    Ideal Body Weight:  40 kg  BMI:  Body mass index is 19.01 kg/m.  Estimated Nutritional Needs:   Kcal:  1115-1300 (30-35 kcal/kg)  Protein:  50-60 grams  Fluid:  >/= 1.5 L/day      Jarome Matin, MS, RD, LDN, [facility] Inpatient Clinical Dietitian Pager # [phone] After hours/weekend pager # [phone]

## 2017-05-30 NOTE — Evaluation (Addendum)
Physical Therapy Evaluation Patient Details Name: Katelyn [name] MRN: [id number] DOB: 04-Sep-1950 Today's Date: 05/30/2017   History of Present Illness  67 year old female with PMH of CVA, DVT, HTN, epiglottic lesion. At baseline she uses [employer], requires assistance feeding and has chronic right-sided weakness secondary to CVA, non-compliant with walker. She presented to the ED on 4/5 with R hip pain s/p fall and was subsequently taken to the OR for Intramedullary Nail Intertrochanteric Femur. Patient also with findings of mass in neck requiring tracheostomy.  Clinical Impression  Orders received for PT evaluation. Patient demonstrates deficits in functional mobility as indicated below. Will benefit from continued skilled PT to address deficits and maximize function. Will see as indicated and progress as tolerated.      Follow Up Recommendations SNF;Supervision/Assistance - 24 hour    Equipment Recommendations  (TBD)    Recommendations for Other Services       Precautions / Restrictions Precautions Precautions: Fall Precaution Comments: watch O2 saturations patient on ATC/trach Restrictions Weight Bearing Restrictions: Yes RLE Weight Bearing: Weight bearing as tolerated      Mobility  Bed Mobility Overal bed mobility: Needs Assistance Bed Mobility: Rolling;Supine to Sit Rolling: Mod assist   Supine to sit: Max assist     General bed mobility comments: Max assist to come to EOB, patient was able to initate LEs movement but very limited by pain and anxiety  Transfers Overall transfer level: Needs assistance Equipment used: 1 person hand held assist Transfers: Sit to/from [employer] Transfers Sit to Stand: Mod assist Stand pivot transfers: Mod assist       General transfer comment: face to face transfer, increased time and physical assist  Ambulation/Gait             General Gait Details: did not perform   Stairs            Wheelchair  Mobility    Modified Rankin (Stroke Patients Only)       Balance Overall balance assessment: History of Falls                                           Pertinent Vitals/Pain Pain Assessment: Faces Faces Pain Scale: Hurts whole lot Pain Location: right LE hip and neck Pain Descriptors / Indicators: Grimacing;Guarding;Operative site guarding Pain Intervention(s): Monitored during session;Repositioned    Home Living Family/patient expects to be discharged to:: Private residence Living Arrangements: Non-relatives/Friends Available Help at Discharge: Family;Friend(s) Type of Home: House Home Access: Stairs to enter Entrance Stairs-Rails: Left Entrance Stairs-Number of Steps: 3 Home Layout: One level Home Equipment: Walker - 4 wheels;Shower seat;Cane - single point      Prior Function           Comments: granddaughters at bedside state that patient is pretty independent usually, however this is not consistent with information obtained via chart review     Hand Dominance   Dominant Hand: Right    Extremity/Trunk Assessment   Upper Extremity Assessment Upper Extremity Assessment: Defer to OT evaluation    Lower Extremity Assessment Lower Extremity Assessment: Generalized weakness;RLE deficits/detail RLE: Unable to fully assess due to pain       Communication   Communication: Tracheostomy  Cognition Arousal/Alertness: Awake/alert Behavior During Therapy: Anxious Overall Cognitive Status: Difficult to assess  General Comments      Exercises     Assessment/Plan    PT Assessment Patient needs continued PT services  PT Problem List Decreased strength;Decreased activity tolerance;Decreased balance;Decreased mobility;Decreased cognition;Decreased safety awareness;Pain       PT Treatment Interventions DME instruction;Gait training;Functional mobility training;Therapeutic  activities;Therapeutic exercise;Stair training;Balance training;Patient/family education    PT Goals (Current goals can be found in the Care Plan section)  Acute Rehab PT Goals Patient Stated Goal: to be able to walk PT Goal Formulation: With patient/family Time For Goal Achievement: 06/13/17 Potential to Achieve Goals: Fair    Frequency Min 5X/week   Barriers to discharge Decreased caregiver support      Co-evaluation               AM-PAC PT "6 Clicks" Daily Activity  Outcome Measure Difficulty turning over in bed (including adjusting bedclothes, sheets and blankets)?: Unable Difficulty moving from lying on back to sitting on the side of the bed? : Unable Difficulty sitting down on and standing up from a chair with arms (e.g., wheelchair, bedside commode, etc,.)?: Unable Help needed moving to and from a bed to chair (including a wheelchair)?: A Lot Help needed walking in hospital room?: A Lot Help needed climbing 3-5 steps with a railing? : Total 6 Click Score: 8    End of Session Equipment Utilized During Treatment: Gait belt;Oxygen(28% FiO2 ATC) Activity Tolerance: Patient tolerated treatment well;Patient limited by pain Patient left: in chair;with call bell/phone within reach;with nursing/sitter in room;with family/visitor present Nurse Communication: Mobility status PT Visit Diagnosis: Muscle weakness (generalized) (M62.81);History of falling (Z91.81);Pain;Difficulty in walking, not elsewhere classified (R26.2) Pain - Right/Left: Right Pain - part of body: Hip    Time: 1308-6578 PT Time Calculation (min) (ACUTE ONLY): 24 min   Charges:   PT Evaluation $PT Eval Moderate Complexity: 1 Mod PT Treatments $Therapeutic Activity: 8-22 mins   PT G Codes:        Alben Deeds, PT DPT  Board Certified Neurologic Specialist [phone]   Duncan Dull 05/30/2017, 4:33 PM

## 2017-05-31 DIAGNOSIS — S72001A Fracture of unspecified part of neck of right femur, initial encounter for closed fracture: Secondary | ICD-10-CM

## 2017-05-31 DIAGNOSIS — N183 Chronic kidney disease, stage 3 (moderate): Secondary | ICD-10-CM

## 2017-05-31 DIAGNOSIS — T884XXA Failed or difficult intubation, initial encounter: Secondary | ICD-10-CM

## 2017-05-31 LAB — CBC
HEMATOCRIT: 30 % — AB (ref 36.0–46.0)
HEMOGLOBIN: 9.5 g/dL — AB (ref 12.0–15.0)
MCH: 25.9 pg — ABNORMAL LOW (ref 26.0–34.0)
MCHC: 31.7 g/dL (ref 30.0–36.0)
MCV: 81.7 fL (ref 78.0–100.0)
Platelets: 211 10*3/uL (ref 150–400)
RBC: 3.67 MIL/uL — ABNORMAL LOW (ref 3.87–5.11)
RDW: 16.7 % — AB (ref 11.5–15.5)
WBC: 8.5 10*3/uL (ref 4.0–10.5)

## 2017-05-31 LAB — BASIC METABOLIC PANEL
Anion gap: 12 (ref 5–15)
BUN: 20 mg/dL (ref 6–20)
CHLORIDE: 102 mmol/L (ref 101–111)
CO2: 25 mmol/L (ref 22–32)
CREATININE: 0.86 mg/dL (ref 0.44–1.00)
Calcium: 8.9 mg/dL (ref 8.9–10.3)
GFR calc Af Amer: >60 mL/min (ref >60)
GFR calc non Af Amer: >60 mL/min (ref >60)
GLUCOSE: 71 mg/dL (ref 65–99)
Potassium: 4.4 mmol/L (ref 3.5–5.1)
Sodium: 139 mmol/L (ref 135–145)

## 2017-05-31 LAB — TYPE AND SCREEN
ABO/RH(D): AB POS
Antibody Screen: NEGATIVE
UNIT DIVISION: 0
Unit division: 0

## 2017-05-31 LAB — BPAM RBC
BLOOD PRODUCT EXPIRATION DATE: 201904202359
Blood Product Expiration Date: 201904092359
ISSUE DATE / TIME: 201904060748
ISSUE DATE / TIME: 201904060533
UNIT TYPE AND RH: 8400
Unit Type and Rh: 7300

## 2017-05-31 LAB — PREALBUMIN: Prealbumin: <5 mg/dL — ABNORMAL LOW (ref 18–38)

## 2017-05-31 MED ORDER — SODIUM CHLORIDE 0.9 % IV BOLUS
1000.0000 mL | Freq: Once | INTRAVENOUS | Status: AC
  Administered 2017-05-31: 1000 mL via INTRAVENOUS

## 2017-05-31 NOTE — Progress Notes (Signed)
   Subjective: 2 Days Post-Op Procedure(s) (LRB): INTRAMEDULLARY (IM) NAIL INTERTROCHANTRIC FEMUR (Right)  Pt resting comfortably in no acute distress Remains on trach  No new issues regarding the right hip Patient reports pain as mild.  Objective:   VITALS:   Vitals:   05/31/17 0700 05/31/17 0818  BP: 134/81   Pulse: 79   Resp: 18   Temp:  98.2 F (36.8 C)  SpO2: 100%     Right hip incision healing well Dressing in place No signs of erythema or drainage  LABS Recent Labs    05/29/17 2100 05/30/17 0341 05/31/17 0313  HGB 7.2* 6.3* 9.5*  HCT 25.4* 20.8* 30.0*  WBC 17.5* 12.1* 8.5  PLT 273 226 211    Recent Labs    05/29/17 2100 05/30/17 0341 05/31/17 0313  NA 139 137 139  K 4.2 3.5 4.4  BUN 20 23* 20  CREATININE 0.92 0.94 0.86  GLUCOSE 145* 98 71     Assessment/Plan: 2 Days Post-Op Procedure(s) (LRB): INTRAMEDULLARY (IM) NAIL INTERTROCHANTRIC FEMUR (Right) Right hip stable at this point Continue current treatment Pain management as needed Will continue to follow her progress    Kathrynn Speed, [place] [facility] now [employer] [facility] [address] [place], [place], [place] [address] Phone: [phone] [email or web address] Facebook  [employer]

## 2017-05-31 NOTE — Progress Notes (Signed)
[facility]                                                                              Patient Demographics  Katelyn [name], is a 67 y.o. female, DOB - [birth year], PFX:[id number]  Admit date - 05/29/2017   Admitting Physician Nicholes Stairs, MD  Outpatient Primary MD for the patient is Rankins, Bill Salinas, MD  Outpatient specialists:   LOS - 2  days    Chief Complaint  Patient presents with  . Hip Pain       Brief summary  Katelyn [name] is a 67 y.o. female with medical history significant  Stroke, DVT not on anticoagulation, a side weakness, hypertension, gallstone pancreatitis, tobacco use who was admitted for right hip fracture status post fall. She was admitted and taken to the OR for intramedullary nail intertrochanteric femur.  Postoperatively patient required emergent trach on 05/29/2017 by Dr. Hulen Skains for obstructing epiglottic mass.    Assessment & Plan    Principal Problem:   Closed right hip fracture (HCC) Active Problems:   Hypercholesteremia   Essential hypertension   Ataxia following cerebral infarction   CKD (chronic kidney disease) stage 3, GFR 30-59 ml/min (HCC)   Diastolic dysfunction   Tobacco abuse   Anemia   Lesion of epiglottis   Dysphagia   Weight loss   Difficult airway for intubation   Pressure injury of skin   Malnutrition of moderate degree  #1 right hip fracture: Status post intramedullary nail intertrochanteric femur Pain management Rehabilitation Supportive care  #2 anemia: Postoperative PRBC transfusion ordered Follow-up on hematological indicis  #3 CKD 3: Monitor renal function with electrolytes  #4 hypertension: Blood pressure control  #5 history of dysphagia with epiglotic glottic lesion s/p  emergent tracheostomy: For obstructing epiglottic mass Routine trach care Speech/nutrition consult follow-up Outpatient ENT evaluation for epiglottic mass on discharge  Code Status: Full code DVT  Prophylaxis: SCD's Family Communication: Discussed in detail with the patient, all imaging results, lab results explained to the patient   Disposition Plan: SNF vs LTAC  Time Spent in minutes  24 minutes  Procedures:  Right hip surgery 05/29/2017  emergent tracheostomy 05/29/2017  Consultants:   Orthopedist Trauma surgery Antimicrobials:      Medications  Scheduled Meds: . chlorhexidine gluconate (MEDLINE KIT)  15 mL Mouth Rinse BID  . chlorhexidine gluconate (MEDLINE KIT)  15 mL Mouth Rinse BID  . docusate  100 mg Per Tube BID  . heparin  5,000 Units Subcutaneous Q8H  . mouth rinse  15 mL Mouth Rinse QID  . pantoprazole (PROTONIX) IV  40 mg Intravenous QHS   Continuous Infusions: . sodium chloride    . sodium chloride    . lactated ringers 75 mL/hr at 05/30/17 2200   PRN Meds:.sodium chloride, acetaminophen (TYLENOL) oral liquid 160 mg/5 mL **OR** acetaminophen, acetaminophen, albuterol, fentaNYL (SUBLIMAZE) injection, fentaNYL (SUBLIMAZE) injection, hydrALAZINE, HYDROcodone-acetaminophen, midazolam, midazolam, ondansetron (ZOFRAN) IV   Antibiotics   Anti-infectives (From admission, onward)   Start     Dose/Rate Route Frequency Ordered Stop   05/30/17 0030  ceFAZolin (ANCEF) IVPB 1 g/50 mL premix  1 g 100 mL/hr over 30 Minutes Intravenous Every 6 hours 05/29/17 2100 05/30/17 1240   05/29/17 1530  ceFAZolin (ANCEF) IVPB 2g/100 mL premix     2 g 200 mL/hr over 30 Minutes Intravenous To Surgery 05/29/17 1525 05/29/17 1838        Subjective:   Katelyn [name] was seen and examined today.No acute events oted.   Objective:   Vitals:   05/31/17 0700 05/31/17 0800 05/31/17 0818 05/31/17 0900  BP: 134/81 (!) 147/99  125/83  Pulse: 79 92  74  Resp: '18 18  18  ' Temp:   98.2 F (36.8 C)   TempSrc:   Oral   SpO2: 100% 100%  100%  Weight:      Height:        Intake/Output Summary (Last 24 hours) at 05/31/2017 1058 Last data filed at 05/31/2017 0900 Gross per  24 hour  Intake 1780 ml  Output 200 ml  Net 1580 ml     Wt Readings from Last 3 Encounters:  05/31/17 39.4 kg (86 lb 13.8 oz)  12/07/14 55.3 kg (122 lb)  12/01/14 55.4 kg (122 lb 3.2 oz)     Exam  General: NAD  HEENT:  NCAT,  PERRL,MMM  Neck: Clinically noted SUPPLE, (-) JVD  Cardiovascular: RRR, (-) GALLOP, (-) MURMUR  Respiratory: CTA  Gastrointestinal: SOFT, (-) DISTENSION, BS(+), (_) TENDERNESS  Ext: (-) CYANOSIS, (-) EDEMA  Neuro: A, OX 3  Skin:(-) RASH  Psych:NORMAL AFFECT/MOOD   Data Reviewed:  I have personally reviewed following labs and imaging studies  Micro Results Recent Results (from the past 240 hour(s))  MRSA PCR Screening     Status: None   Collection Time: 05/29/17  9:02 PM  Result Value Ref Range Status   MRSA by PCR NEGATIVE NEGATIVE Final    Comment:        The GeneXpert MRSA Assay (FDA approved for NASAL specimens only), is one component of a comprehensive MRSA colonization surveillance program. It is not intended to diagnose MRSA infection nor to guide or monitor treatment for MRSA infections. Performed at [facility], [place] [address] [place], [place] [address]     Radiology Reports Dg Chest 1 View  Result Date: 05/29/2017 CLINICAL DATA:  Pain following fall EXAM: CHEST  1 VIEW COMPARISON:  March 02, 2014 FINDINGS: No edema or consolidation. Heart size and pulmonary vascularity are normal. No adenopathy. There is aortic atherosclerosis. No pneumothorax. No bone lesions. IMPRESSION: Aortic atherosclerosis. No edema or consolidation. No pneumothorax. Aortic Atherosclerosis (ICD10-I70.0). Electronically Signed   By: Lowella Grip III M.D.   On: 05/29/2017 11:41   Ct Hip Right Wo Contrast  Result Date: 05/29/2017 CLINICAL DATA:  Right hip pain since a fall 3 days ago. Initial encounter. EXAM: CT OF THE RIGHT HIP WITHOUT CONTRAST TECHNIQUE: Multidetector CT imaging of the right hip was performed according to the standard  protocol. Multiplanar CT image reconstructions were also generated. COMPARISON:  Plain films right hip 05/29/2017. FINDINGS: Bones/Joint/Cartilage As seen on the comparison plain films, the patient has an acute right intertrochanteric fracture. Impaction at the medial margin of the fracture of approximately 1 cm is identified. Mild varus angulation is noted. No finding to suggest pathologic fracture is identified. The right femoral head is located. Ligaments Suboptimally assessed by CT. Muscles and Tendons Intact. Soft tissues Imaged intrapelvic contents demonstrate no acute abnormality. IMPRESSION: Acute right intertrochanteric fracture as described above. No other acute abnormality. Electronically Signed   By: Marcello Moores  Dalessio M.D.   On: 05/29/2017 13:45   Dg Chest Port 1 View  Result Date: 05/29/2017 CLINICAL DATA:  Respiratory failure.  Tracheostomy check. EXAM: PORTABLE CHEST 1 VIEW COMPARISON:  05/29/2017 at 11:02 a.m. FINDINGS: Tracheostomy has been placed since the earlier exam. Tip projects in the upper thoracic trachea. Cardiac silhouette is normal in size. No mediastinal or hilar masses. Lungs clear allowing for the supine positioning. IMPRESSION: 1. Well-positioned tracheostomy tube. 2. No acute cardiopulmonary disease. Electronically Signed   By: Lajean Manes M.D.   On: 05/29/2017 21:18   Dg Abd Portable 1v  Result Date: 05/30/2017 CLINICAL DATA:  NG tube placement EXAM: PORTABLE ABDOMEN - 1 VIEW COMPARISON:  None. FINDINGS: Enteric tube tip is in the left upper quadrant consistent with location in the upper stomach. Visualized lung bases are clear. Gas and stool throughout the colon. Visualized bowel are not abnormally dilated. IMPRESSION: Enteric tube tip is in the left upper quadrant consistent with location in the upper stomach. Electronically Signed   By: Lucienne Capers M.D.   On: 05/30/2017 00:47   Dg C-arm 1-60 Min  Result Date: 05/29/2017 CLINICAL DATA:  Right femoral intramedullary  nail fixation. EXAM: RIGHT FEMUR 2 VIEWS; DG C-ARM 61-120 MIN COMPARISON:  Preoperative CT and radiographs from the same day FINDINGS: A total of 48 seconds of fluoroscopic time was utilized for placement of right femoral nail across an intertrochanteric fracture of the right femur. Fine bony detail is limited on the 4 images acquired from the fluoroscopic technique. No intraoperative nor immediate postoperative complications are identified. IMPRESSION: New intramedullary nail fixation of intertrochanteric fracture of the right femur without immediate complications identified. Electronically Signed   By: Ashley Royalty M.D.   On: 05/29/2017 19:45   Dg Hip Unilat W Or Wo Pelvis 2-3 Views Right  Result Date: 05/29/2017 CLINICAL DATA:  Pain following fall EXAM: DG HIP (WITH OR WITHOUT PELVIS) 2-3V RIGHT COMPARISON:  None. FINDINGS: Frontal pelvis as well as frontal and lateral right hip images were obtained. There is an intertrochanteric fracture of the right femur with varus angulation at the fracture site. No other fracture. No dislocation. There is mild symmetric narrowing of both hip joints. IMPRESSION: Intertrochanteric femur fracture on the right with varus angulation at the fracture site. No other fracture. No dislocation. Mild symmetric narrowing of each hip joint. Electronically Signed   By: Lowella Grip III M.D.   On: 05/29/2017 11:40   Dg Femur, Min 2 Views Right  Result Date: 05/29/2017 CLINICAL DATA:  Right femoral intramedullary nail fixation. EXAM: RIGHT FEMUR 2 VIEWS; DG C-ARM 61-120 MIN COMPARISON:  Preoperative CT and radiographs from the same day FINDINGS: A total of 48 seconds of fluoroscopic time was utilized for placement of right femoral nail across an intertrochanteric fracture of the right femur. Fine bony detail is limited on the 4 images acquired from the fluoroscopic technique. No intraoperative nor immediate postoperative complications are identified. IMPRESSION: New intramedullary  nail fixation of intertrochanteric fracture of the right femur without immediate complications identified. Electronically Signed   By: Ashley Royalty M.D.   On: 05/29/2017 19:45    Lab Data:  CBC: Recent Labs  Lab 05/29/17 1143 05/29/17 2100 05/30/17 0341 05/31/17 0313  WBC 11.7* 17.5* 12.1* 8.5  NEUTROABS 9.7* 16.2*  --   --   HGB 8.4* 7.2* 6.3* 9.5*  HCT 28.9* 25.4* 20.8* 30.0*  MCV 83.0 84.9 83.9 81.7  PLT 322 273 226 643   Basic Metabolic Panel:  Recent Labs  Lab 05/29/17 1143 05/29/17 2100 05/30/17 0341 05/31/17 0313  NA 138 139 137 139  K 3.7 4.2 3.5 4.4  CL 98* 99* 98* 102  CO2 '28 28 25 25  ' GLUCOSE 105* 145* 98 71  BUN 19 20 23* 20  CREATININE 0.80 0.92 0.94 0.86  CALCIUM 9.6 9.0 8.7* 8.9  MG  --  1.9 1.8  --   PHOS  --  5.0* 2.2*  --    GFR: Estimated Creatinine Clearance: 34.1 mL/min (by C-G formula based on SCr of 0.86 mg/dL). Liver Function Tests: Recent Labs  Lab 05/29/17 1143 05/29/17 2100  AST 19 30  ALT 13* 14  ALKPHOS 72 66  BILITOT 0.8 0.5  PROT 7.9 7.1  ALBUMIN 2.6* 2.3*   No results for input(s): LIPASE, AMYLASE in the last 168 hours. No results for input(s): AMMONIA in the last 168 hours. Coagulation Profile: Recent Labs  Lab 05/29/17 1526 05/29/17 2100  INR 1.14 1.15   Cardiac Enzymes: No results for input(s): CKTOTAL, CKMB, CKMBINDEX, TROPONINI in the last 168 hours. BNP (last 3 results) No results for input(s): PROBNP in the last 8760 hours. HbA1C: No results for input(s): HGBA1C in the last 72 hours. CBG: Recent Labs  Lab 05/30/17 0404 05/30/17 0714  GLUCAP 88 94   Lipid Profile: No results for input(s): CHOL, HDL, LDLCALC, TRIG, CHOLHDL, LDLDIRECT in the last 72 hours. Thyroid Function Tests: No results for input(s): TSH, T4TOTAL, FREET4, T3FREE, THYROIDAB in the last 72 hours. Anemia Panel: Recent Labs    05/29/17 1526  VITAMINB12 824  FOLATE 3.6*  FERRITIN 410*  TIBC 210*  IRON 12*  RETICCTPCT 2.0   Urine  analysis:    Component Value Date/Time   COLORURINE AMBER (A) 01/07/2014 2323   APPEARANCEUR CLOUDY (A) 01/07/2014 2323   LABSPEC 1.022 01/07/2014 2323   PHURINE 6.0 01/07/2014 2323   GLUCOSEU NEGATIVE 01/07/2014 2323   HGBUR NEGATIVE 01/07/2014 2323   HGBUR trace-lysed 12/30/2006 0956   BILIRUBINUR SMALL (A) 01/07/2014 2323   KETONESUR 15 (A) 01/07/2014 2323   PROTEINUR NEGATIVE 01/07/2014 2323   UROBILINOGEN 4.0 (H) 01/07/2014 2323   NITRITE NEGATIVE 01/07/2014 2323   LEUKOCYTESUR NEGATIVE 01/07/2014 2323     Benito Mccreedy M.D. [facility] 05/31/2017, 10:58 AM  Pager: [phone] Between 7am to 7pm - call Pager - [phone]  After 7pm go to [email or web address] - password [email or web address]  Call night coverage person covering after 7pm

## 2017-05-31 NOTE — Progress Notes (Signed)
Patient on trach collar.  Needs ENT evaluation, preferably before she is discharged.  We will sign off for now.  Katelyn [name]. [name], MD, [place] [phone] Trauma Surgeon

## 2017-05-31 NOTE — Progress Notes (Signed)
PULMONARY / CRITICAL CARE MEDICINE   Name: Katelyn [name] MRN: [id number] DOB: 08-Nov-1950    ADMISSION DATE:  05/29/2017 CONSULTATION DATE:  05/29/2017   REFERRING MD:  Dr. Stann Mainland   CHIEF COMPLAINT:  S/P Tracheostomy   HISTORY OF PRESENT ILLNESS:   67 year old female with PMH of CVA, DVT, HTN, Epiglottic Lesion. Baseline uses pampers, requires assistance feeding, with chronic right-sided weakness secondary to CVA, non-compliant with walker     Presents to ED on 4/5 with right hip pain s/p fall. Take to OR for Intramedullary Nail Intertrochanteric Femur. Underwent Spinal Block for procedure. Was given 25 mcg fentanyl, bupivacaine, and 10 mg propofol. Tolerated procedure well and taken to PACU. In PACU patient was noted to be obtunded and hypoxic with saturation 70-80 despite supplemental supplemental oxygen and attempted bagging. Taken to OR for intubated due to epiglottic mass. Unable to place ETT so Tracheostomy was placed  4/7 The patient is in trach collar and appears quite comfortable. She pulled out her NGT yesterday. We are waiting for IR to place a clortrack tomorrow. . According to her family thepatienthad been having trouble swallowing for a while and losing weight at home   .    PAST MEDICAL HISTORY :  She  has a past medical history of Acute gallstone pancreatitis (01/08/2014), AKI (acute kidney injury) ([place]) (01/08/2014), Anemia, Blood transfusion, Cholecystitis, Hypercholesteremia, Hypertension, Right leg DVT ([place]) (05/08/2014), Septic shock - Kelbsiella (01/09/2014), Shortness of breath, and Stroke ([place]).  PAST SURGICAL HISTORY: She  has a past surgical history that includes Abdominal hysterectomy; TEE without cardioversion (03/06/2011); Endoscopic retrograde cholangiopancreatography (ercp) with propofol (N/A, 01/11/2014); ERCP; ERCP (N/A, 05/26/2014); EUS (N/A, 12/07/2014); Esophagogastroduodenoscopy (egd) with propofol (N/A, 12/07/2014); and Gastrointestinal stent removal (N/A,  12/07/2014).  No Known Allergies  No current facility-administered medications on file prior to encounter.    Current Outpatient Medications on File Prior to Encounter  Medication Sig  . acetaminophen (TYLENOL) 325 MG tablet Take by mouth as needed for mild pain or headache.  . carvedilol (COREG) 12.5 MG tablet Take 1 tablet (12.5 mg total) by mouth 2 (two) times daily with a meal. (Patient not taking: Reported on 05/29/2017)  . furosemide (LASIX) 20 MG tablet Take 1 tablet (20 mg total) by mouth daily. (Patient not taking: Reported on 05/29/2017)  . potassium chloride 20 MEQ TBCR Take 20 mEq by mouth daily. (Patient not taking: Reported on 05/29/2017)       VITAL SIGNS: BP 125/83   Pulse 74   Temp 98.2 F (36.8 C) (Oral)   Resp 18   Ht 4\' 7"  (1.397 m)   Wt 86 lb 13.8 oz (39.4 kg)   SpO2 100%   BMI 20.19 kg/m   HEMODYNAMICS:    VENTILATOR SETTINGS: FiO2 (%):  [28 %] 28 %  INTAKE / OUTPUT: I/O last 3 completed shifts: In: 3770.8 [I.V.:2788.3; Blood:657.5; NG/GT:120; IV Piggyback:205] Out: 250 [Urine:200; Blood:50]  PHYSICAL EXAMINATION: General: Elderly female, no distress. Appears comfortab;e Neuro:  alert, follows commands  HEENT:  Trach in place Cardiovascular:  RRR, no MRG  Lungs:  Coarse rhonchi anteriorly, minimal secretions suctioned from deep inline suctioning Abdomen:  non-distended, non-tender  Musculoskeletal:  -edema  Skin:  Warm, dry, intact  Extr: no edema  LABS:  BMET Recent Labs  Lab 05/29/17 2100 05/30/17 0341 05/31/17 0313  NA 139 137 139  K 4.2 3.5 4.4  CL 99* 98* 102  CO2 28 25 25   BUN 20 23* 20  CREATININE 0.92  0.94 0.86  GLUCOSE 145* 98 71    Electrolytes Recent Labs  Lab 05/29/17 2100 05/30/17 0341 05/31/17 0313  CALCIUM 9.0 8.7* 8.9  MG 1.9 1.8  --   PHOS 5.0* 2.2*  --     CBC Recent Labs  Lab 05/29/17 2100 05/30/17 0341 05/31/17 0313  WBC 17.5* 12.1* 8.5  HGB 7.2* 6.3* 9.5*  HCT 25.4* 20.8* 30.0*  PLT 273 226 211     Coag's Recent Labs  Lab 05/29/17 1526 05/29/17 2100  INR 1.14 1.15    Sepsis Markers No results for input(s): LATICACIDVEN, PROCALCITON, O2SATVEN in the last 168 hours.  ABG Recent Labs  Lab 05/29/17 2145 05/29/17 2350 05/30/17 0447  PHART 7.294* 7.416 7.496*  PCO2ART 61.8* 47.0 35.7  PO2ART 54.0* 265.0* 126.0*    Liver Enzymes Recent Labs  Lab 05/29/17 1143 05/29/17 2100  AST 19 30  ALT 13* 14  ALKPHOS 72 66  BILITOT 0.8 0.5  ALBUMIN 2.6* 2.3*    Cardiac Enzymes No results for input(s): TROPONINI, PROBNP in the last 168 hours.  Glucose Recent Labs  Lab 05/30/17 0404 05/30/17 0714  GLUCAP 88 94    Imaging No results found.   STUDIES:  XRay Right Hip 4/5 > Intertrochanteric femur fracture on the right with varus angulation at the fracture site. No other fracture. No dislocation. Mild symmetric narrowing of each hip joint. CXR 4/5 > No edema or consolidation. Heart size and pulmonary vascularity are normal. No adenopathy. There is aortic atherosclerosis. No pneumothorax. No bone lesions CT Hip 4/5 > Acute right intertrochanteric fracture as described above. No other acute abnormality  CULTURES: None.   ANTIBIOTICS: Ancef 4/5    SIGNIFICANT EVENTS: 4/5 > Presents to ED. Emergent tracheostomy placed after postop respiratory failure   LINES/TUBES: PIV   DISCUSSION: 67 year old female presents s/p Fall with right hip fracture. Taken to OR. In PACU patient went into respiratory distress requiring tracheostomy placement in OR.   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  -Reportedly was told to follow up for mass but did not  Patient on trach collar. ENT consulted to see the patient  Diastolic Heart Failure (EF 55, G1DD)  H/O HTN  P:  Cardiac Monitoring  Hold Coreg/Lasix    CKD  the patient has somne history of CKD. Howver current GFR estiamated > 60 presently  -T  Anemia of  Chronic Disease  HB 9.5. her Hb was only 8.4 on presentation. 3 years ago it was close to 11. Iron studies suggest chronic disease   Right Hip Fracture s/p IM Nail secondary to Fall  Plan: Per Ortho   Impression The patient is doing well. Had her hip repaired. Will need biopsy and definityive plan for the epiglottic mass. For now thepatientis NPO.

## 2017-05-31 NOTE — Progress Notes (Signed)
[facility] Progress Note Patient Name: Katelyn [name] DOB: 06-Oct-1950 MRN: [id number]   Date of Service  05/31/2017  HPI/Events of Note  Oliguria - Urine output < 200 for last 4 hours. LVEF = 50-55%. Weight = 40 kg. She only needs to make about 0.5 mL/kg/hour = 20 mL/hour of urine. However, will give volume challenge anyway.   eICU Interventions  Will order: 1. 0.9 NaCl 1 liter IV over 1 hour now.      Intervention Category Intermediate Interventions: Oliguria - evaluation and management  Sommer,Steven Eugene 05/31/2017, 9:30 PM

## 2017-05-31 NOTE — Progress Notes (Signed)
Clinical Social Worker spoke with patients and daughter [name]) at bedside about discharge plan to rehab. Patient and daughter stated they are aware that she will need rehab once medically cleared by MD but they have not yet discussed it. CSW asked patient if she needed some time to discuss with family about short term rehab and patient stated "yes". CSW will give family a list of facility in the [place] area since patient stated she would like to stay close to home/family.   Rhea Pink, MSW,  [place]

## 2017-06-01 ENCOUNTER — Encounter (HOSPITAL_COMMUNITY): Payer: Self-pay | Admitting: General Surgery

## 2017-06-01 ENCOUNTER — Inpatient Hospital Stay (HOSPITAL_COMMUNITY): Payer: Medicare HMO

## 2017-06-01 DIAGNOSIS — S72001S Fracture of unspecified part of neck of right femur, sequela: Secondary | ICD-10-CM

## 2017-06-01 DIAGNOSIS — T884XXD Failed or difficult intubation, subsequent encounter: Secondary | ICD-10-CM

## 2017-06-01 MED ORDER — BISACODYL 10 MG RE SUPP: 10.0000 mg | Freq: Every day | RECTAL | Status: DC | PRN

## 2017-06-01 MED ORDER — POLYETHYLENE GLYCOL 3350 17 G PO PACK
17.0000 g | PACK | Freq: Two times a day (BID) | ORAL | Status: DC
  Administered 2017-06-01 – 2017-06-11 (×13): 17 g
  Filled 2017-06-01 (×15): qty 1

## 2017-06-01 MED ORDER — SODIUM CHLORIDE 0.9 % IV SOLN: 250.0000 mL | INTRAVENOUS | Status: DC | PRN

## 2017-06-01 NOTE — Progress Notes (Signed)
  [facility] TEAM 1 - Stepdown/ICU TEAM  Chart reviewed.  All active medical care currently being provided by PCCM.  TRH will sign off.  Cherene Altes, MD [facility] Office  [phone] Pager - Text Page per Amion as per below:  On-Call/Text Page:      [email or web address]      password [email or web address]  If 7PM-7AM, please contact night-coverage [email or web address] Password [email or web address] 06/01/2017, 3:47 PM

## 2017-06-01 NOTE — Progress Notes (Signed)
MD notified that patient had not voided since 0600. Patients bladder scan revealed 105 at 1200. No new orders were placed at this time. Will continue to monitor at this time.

## 2017-06-01 NOTE — Progress Notes (Signed)
PULMONARY / CRITICAL CARE MEDICINE   Name: Katelyn [name] MRN: [id number] DOB: 1950/11/05    ADMISSION DATE:  05/29/2017 CONSULTATION DATE:  05/29/2017   REFERRING MD:  Dr. Stann Mainland   CHIEF COMPLAINT:  S/P Tracheostomy   HISTORY OF PRESENT ILLNESS:   67 year old female with PMH of CVA, DVT, HTN, Epiglottic Lesion. Baseline uses pampers, requires assistance feeding, with chronic right-sided weakness secondary to CVA, non-compliant with walker     Presents to ED on 4/5 with right hip pain s/p fall. Take to OR for Intramedullary Nail Intertrochanteric Femur. Underwent Spinal Block for procedure. Was given 25 mcg fentanyl, bupivacaine, and 10 mg propofol. Tolerated procedure well and taken to PACU. In PACU patient was noted to be obtunded and hypoxic with saturation 70-80 despite supplemental supplemental oxygen and attempted bagging. Taken to OR for intubated due to epiglottic mass. Unable to place ETT so Tracheostomy was placed  4/7 The patient is in trach collar and appears quite comfortable. She pulled out her NGT yesterday. We are waiting for IR to place a clortrack tomorrow. . According to her family thepatienthad been having trouble swallowing for a while and losing weight at home.  4/8 Her urine output was scant last night so she was administered a bolus. Willstart low odse iV fluids until patient has placement of feeding tube. She is awake , a;lert and cooperative. Can likely leave the ICU environment   .    PAST MEDICAL HISTORY :  She  has a past medical history of Acute gallstone pancreatitis (01/08/2014), AKI (acute kidney injury) ([place]) (01/08/2014), Anemia, Blood transfusion, Cholecystitis, Hypercholesteremia, Hypertension, Right leg DVT ([place]) (05/08/2014), Septic shock - Kelbsiella (01/09/2014), Shortness of breath, and Stroke ([place]).  PAST SURGICAL HISTORY: She  has a past surgical history that includes Abdominal hysterectomy; TEE without cardioversion (03/06/2011); Endoscopic  retrograde cholangiopancreatography (ercp) with propofol (N/A, 01/11/2014); ERCP; ERCP (N/A, 05/26/2014); EUS (N/A, 12/07/2014); Esophagogastroduodenoscopy (egd) with propofol (N/A, 12/07/2014); and Gastrointestinal stent removal (N/A, 12/07/2014).  No Known Allergies  No current facility-administered medications on file prior to encounter.    Current Outpatient Medications on File Prior to Encounter  Medication Sig  . acetaminophen (TYLENOL) 325 MG tablet Take by mouth as needed for mild pain or headache.  . carvedilol (COREG) 12.5 MG tablet Take 1 tablet (12.5 mg total) by mouth 2 (two) times daily with a meal. (Patient not taking: Reported on 05/29/2017)  . furosemide (LASIX) 20 MG tablet Take 1 tablet (20 mg total) by mouth daily. (Patient not taking: Reported on 05/29/2017)  . potassium chloride 20 MEQ TBCR Take 20 mEq by mouth daily. (Patient not taking: Reported on 05/29/2017)       VITAL SIGNS: BP 137/85   Pulse 71   Temp 97.7 F (36.5 C) (Oral)   Resp (!) 8   Ht 4\' 7"  (1.397 m)   Wt 93 lb 0.6 oz (42.2 kg)   SpO2 100%   BMI 21.62 kg/m   PHYSICAL EXAMINATION: General:  Thin,Elderly female, no distress. Appears comfortable Neuro:  alert, follows commands  HEENT:  Trach in place Cardiovascular:  RRR, no MRG  Lungs:  Coarse rhonchi anteriorly, minimal secretions suctioned from deep inline suctioning Abdomen:  non-distended, non-tender  Musculoskeletal:  -edema  Skin:  Warm, dry, intact  Extr: no edema  LABS:  BMET Recent Labs  Lab 05/29/17 2100 05/30/17 0341 05/31/17 0313  NA 139 137 139  K 4.2 3.5 4.4  CL 99* 98* 102  CO2 28 25 25  BUN 20 23* 20  CREATININE 0.92 0.94 0.86  GLUCOSE 145* 98 71    Electrolytes Recent Labs  Lab 05/29/17 2100 05/30/17 0341 05/31/17 0313  CALCIUM 9.0 8.7* 8.9  MG 1.9 1.8  --   PHOS 5.0* 2.2*  --     CBC Recent Labs  Lab 05/29/17 2100 05/30/17 0341 05/31/17 0313  WBC 17.5* 12.1* 8.5  HGB 7.2* 6.3* 9.5*  HCT 25.4* 20.8*  30.0*  PLT 273 226 211    Coag's Recent Labs  Lab 05/29/17 1526 05/29/17 2100  INR 1.14 1.15    Sepsis Markers No results for input(s): LATICACIDVEN, PROCALCITON, O2SATVEN in the last 168 hours.  ABG Recent Labs  Lab 05/29/17 2145 05/29/17 2350 05/30/17 0447  PHART 7.294* 7.416 7.496*  PCO2ART 61.8* 47.0 35.7  PO2ART 54.0* 265.0* 126.0*    Liver Enzymes Recent Labs  Lab 05/29/17 1143 05/29/17 2100  AST 19 30  ALT 13* 14  ALKPHOS 72 66  BILITOT 0.8 0.5  ALBUMIN 2.6* 2.3*    Cardiac Enzymes No results for input(s): TROPONINI, PROBNP in the last 168 hours.  Glucose Recent Labs  Lab 05/30/17 0404 05/30/17 0714  GLUCAP 88 94    Imaging No results found.   STUDIES:  XRay Right Hip 4/5 > Intertrochanteric femur fracture on the right with varus angulation at the fracture site. No other fracture. No dislocation. Mild symmetric narrowing of each hip joint. CXR 4/5 > No edema or consolidation. Heart size and pulmonary vascularity are normal. No adenopathy. There is aortic atherosclerosis. No pneumothorax. No bone lesions CT Hip 4/5 > Acute right intertrochanteric fracture as described above. No other acute abnormality  CULTURES: None.   ANTIBIOTICS: Ancef 4/5    SIGNIFICANT EVENTS: 4/5 > Presents to ED. Emergent tracheostomy placed after postop respiratory failure   LINES/TUBES: PIV   DISCUSSION: 67 year old female presents s/p Fall with right hip fracture. Taken to OR. In PACU patient went into respiratory distress requiring tracheostomy placement in OR.   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  -Reportedly was told to follow up for mass but did not  Patient on trach collar. ENT consulted to see the patient. Patient could not manage to phonate with the a PASSY MEIR VALVE. uNCLEAR TO WHAT EXTENT THE EPIGLOTTIC MASS MAY BE INTERFERING.  Diastolic Heart Failure (EF 55, G1DD)  H/O HTN   P:  Cardiac Monitoring  Hold Coreg/Lasix    CKD  the patient has some history of CKD. Howver current GFR estiamated > 60 presently. aS NOTED ABOVE I WOILL INCREASE iv FLUIDS FOR NOW    Anemia of Chronic Disease  HB 9.5. her Hb was only 8.4 on presentation. 3 years ago it was close to 11. Iron studies suggest chronic disease   Right Hip Fracture s/p IM Nail secondary to Fall  Plan: Per Ortho   Impression The patient is doing well. Had her hip repaired. Will need biopsy and definityive plan for the epiglottic mass. For now thepatientis NPO.

## 2017-06-01 NOTE — Evaluation (Signed)
Occupational Therapy Evaluation Patient Details Name: Katelyn [name] MRN: [id number] DOB: March 26, 1950 Today's Date: 06/01/2017    History of Present Illness This 67 y.o. female admitted after falling asleep at her table and falling out of the chair.  She sustained Rt femur fx and underwent ORIF.  Post operatviely, she developed respiratory distress and required an emergent trach due to subglottic mass.  PMH includes:  Diastolic HF, CKD stage 3, h/o CVA with Rt sided weakness    Clinical Impression   Pt admitted with above. She demonstrates the below listed deficits and will benefit from continued OT to maximize safety and independence with BADLs.  Pt presents to OT with generalized weakness, Rt LE pain, cognition difficult to assess due to trach, decreased activity tolerance.  She currently requires min - total A for ADLs.  PTA, she apparently lived with boyfriend and, per her report was mod I with ADLs, and they both assisted with IADLs.   Recommend SNF level rehab at discharge.       Follow Up Recommendations  SNF    Equipment Recommendations  None recommended by OT    Recommendations for Other Services       Precautions / Restrictions Precautions Precautions: Fall Restrictions Weight Bearing Restrictions: No RLE Weight Bearing: Weight bearing as tolerated      Mobility Bed Mobility Overal bed mobility: Needs Assistance Bed Mobility: Supine to Sit;Sit to Supine     Supine to sit: Mod assist Sit to supine: Max assist   General bed mobility comments: Pt requires assist to move LEs on and off bed and to lift trunk   Transfers                 General transfer comment: Pt refused attempts to move sit to stand due to fatigue     Balance Overall balance assessment: Needs assistance;History of Falls Sitting-balance support: Feet supported;Bilateral upper extremity supported Sitting balance-Leahy Scale: Poor Sitting balance - Comments: requires UE support                                     ADL either performed or assessed with clinical judgement   ADL Overall ADL's : Needs assistance/impaired Eating/Feeding: Set up   Grooming: Wash/dry hands;Wash/dry face;Minimal assistance;Sitting   Upper Body Bathing: Moderate assistance;Sitting   Lower Body Bathing: Maximal assistance;Bed level;Sitting/lateral leans   Upper Body Dressing : Moderate assistance;Sitting   Lower Body Dressing: Total assistance;Bed level;Sitting/lateral leans   Toilet Transfer: Total assistance Toilet Transfer Details (indicate cue type and reason): Pt unable due to pain          Functional mobility during ADLs: Moderate assistance;Total assistance       Vision         Perception     Praxis      Pertinent Vitals/Pain Pain Assessment: Faces Faces Pain Scale: Hurts little more Pain Location: right LE hip and neck Pain Descriptors / Indicators: Aching Pain Intervention(s): Monitored during session;Repositioned     Hand Dominance Right   Extremity/Trunk Assessment Upper Extremity Assessment Upper Extremity Assessment: Generalized weakness(Pt able to manipulate utensil whe both Rt and Lt hand )   Lower Extremity Assessment Lower Extremity Assessment: Defer to PT evaluation   Cervical / Trunk Assessment Cervical / Trunk Assessment: Normal   Communication Communication Communication: Tracheostomy   Cognition Arousal/Alertness: Awake/alert Behavior During Therapy: Anxious Overall Cognitive Status: No family/caregiver present to determine baseline  cognitive functioning                                 General Comments: Pt is distracted by pain, and difficult to redirect to task.  She has a tracheostomy which makes communication difficult and no family is present    General Comments  VSS     Exercises     Shoulder Instructions      Home Living Family/patient expects to be discharged to:: Private residence Living  Arrangements: Non-relatives/Friends Available Help at Discharge: Family;Friend(s) Type of Home: House Home Access: Stairs to enter [employer] of Steps: 3 Entrance Stairs-Rails: Left Home Layout: One level     Bathroom Shower/Tub: Teacher, early years/pre: Standard Bathroom Accessibility: Yes   Home Equipment: Environmental consultant - 4 wheels;Shower seat;Cane - single point   Additional Comments: info gleaned from chart review      Prior Functioning/Environment          Comments: Pt mouths that she and her boyfriend do things together, but she indicates she was independent with ADLs         OT Problem List: Decreased strength;Decreased activity tolerance;Impaired balance (sitting and/or standing);Decreased cognition;Decreased safety awareness;Decreased knowledge of use of DME or AE;Cardiopulmonary status limiting activity;Pain      OT Treatment/Interventions: Self-care/ADL training;Therapeutic exercise;DME and/or AE instruction;Therapeutic activities;Cognitive remediation/compensation;Patient/family education;Balance training;Energy conservation    OT Goals(Current goals can be found in the care plan section) Acute Rehab OT Goals Patient Stated Goal: to go home  OT Goal Formulation: With patient Time For Goal Achievement: 06/15/17 Potential to Achieve Goals: Good ADL Goals Pt Will Perform Grooming: with min assist;standing Pt Will Perform Upper Body Bathing: with set-up;sitting Pt Will Perform Lower Body Bathing: sit to/from stand;with adaptive equipment;with mod assist Pt Will Perform Upper Body Dressing: with min assist;sitting Pt Will Perform Lower Body Dressing: with mod assist;sit to/from stand;with adaptive equipment Pt Will Transfer to Toilet: with min assist;ambulating;regular height toilet;bedside commode;grab bars Pt Will Perform Toileting - Clothing Manipulation and hygiene: with min assist;sit to/from stand  OT Frequency: Min 2X/week   Barriers to  D/C: Decreased caregiver support          Co-evaluation              AM-PAC PT "6 Clicks" Daily Activity     Outcome Measure Help from another person eating meals?: A Little Help from another person taking care of personal grooming?: A Little Help from another person toileting, which includes using toliet, bedpan, or urinal?: A Lot Help from another person bathing (including washing, rinsing, drying)?: A Lot Help from another person to put on and taking off regular upper body clothing?: A Lot Help from another person to put on and taking off regular lower body clothing?: Total 6 Click Score: 13   End of Session Equipment Utilized During Treatment: Oxygen Nurse Communication: Mobility status  Activity Tolerance: Patient limited by fatigue Patient left: in bed;with call bell/phone within reach  OT Visit Diagnosis: Unsteadiness on feet (R26.81);Pain Pain - Right/Left: Right Pain - part of body: Hip                Time: 6387-5643 OT Time Calculation (min): 22 min Charges:  OT General Charges $OT Visit: 1 Visit OT Evaluation $OT Eval Moderate Complexity: 1 Mod G-Codes:     [facility], OTR/L [phone]   Lucille Passy M 06/01/2017, 5:03 PM

## 2017-06-01 NOTE — Progress Notes (Addendum)
  Speech Language Pathology Treatment: Nada Boozer Speaking valve  Patient Details Name: Katelyn [name] MRN: [id number] DOB: 1950/11/06 Today's Date: 06/01/2017 Time: 1100-1120 SLP Time Calculation (min) (ACUTE ONLY): 20 min  Assessment / Plan / Recommendation Clinical Impression  Attempted PMSV, pt with no redirection of air to upper airway with finger occlusion of PMSV with attempted phonation or just respiration, immediately removed x2. Expect pts small stature with 6 cuffed trach (deflated) and epiglottic mass to impede airflow. Suggest 4 cuffed trach if/when possible. Demonstrated to MD. Will attempt alternative communication methods tomorrow.   HPI HPI: 76yoF with hx CVA, DVT (2016), HTN, Anemia, Gallstone pancreatitis, and Epiglottis mass, who presented with a hip fracture, now s/p operative repair. Intubated for procedure; postop patient developed respiratory distress and required emergent trach.       SLP Plan  Continue with current plan of care       Recommendations         Patient may use Passy-Muir Speech Valve: with SLP only         Oral Care Recommendations: Oral care QID Follow up Recommendations: Outpatient SLP Plan: Continue with current plan of care       GO               [facility], MA CCC-SLP [phone]  Lynann Beaver 06/01/2017, 11:37 AM

## 2017-06-01 NOTE — Progress Notes (Signed)
   Subjective: 3 Days Post-Op Procedure(s) (LRB): INTRAMEDULLARY (IM) NAIL INTERTROCHANTRIC FEMUR (Right)  Pt resting comfortably in no acute distress Remains on trach  No new issues regarding the right hip Patient reports pain as mild.  She has been up with therapy to the chair.  Objective:   VITALS:   Vitals:   06/01/17 1600 06/01/17 1700  BP: 137/78 (!) 145/87  Pulse: 70 85  Resp: (!) 8 13  Temp: 98.3 F (36.8 C)   SpO2: 96% 98%    Right hip incision healing well Dressing in place No signs of erythema or drainage  LABS Recent Labs    05/29/17 2100 05/30/17 0341 05/31/17 0313  HGB 7.2* 6.3* 9.5*  HCT 25.4* 20.8* 30.0*  WBC 17.5* 12.1* 8.5  PLT 273 226 211    Recent Labs    05/29/17 2100 05/30/17 0341 05/31/17 0313  NA 139 137 139  K 4.2 3.5 4.4  BUN 20 23* 20  CREATININE 0.92 0.94 0.86  GLUCOSE 145* 98 71     Assessment/Plan: 3 Days Post-Op Procedure(s) (LRB): INTRAMEDULLARY (IM) NAIL INTERTROCHANTRIC FEMUR (Right) Right hip stable at this point Continue current treatment Pain management as needed Will continue to follow her progress    [place] is now [facility]  [facility] [address], [place], [place], [place] [address] Phone: [phone] [email or web address]  [employer]

## 2017-06-01 NOTE — Progress Notes (Signed)
Physical Therapy Treatment Patient Details Name: Katelyn [name] MRN: [id number] DOB: [birth year] Today's Date: 06/01/2017    History of Present Illness 67 year old female with PMH of CVA, DVT, HTN, epiglottic lesion. At baseline she uses Pampers, requires assistance feeding and has chronic right-sided weakness secondary to CVA, non-compliant with walker. She presented to the ED on 4/5 with R hip pain s/p fall and was subsequently taken to the OR for Intramedullary Nail Intertrochanteric Femur    PT Comments    Pt progressing towards functional mobility goals. Pt requiring max assist for bed mobility to sit EOB and min assist +2 for sit<>stand from EOB. Mod assist +2 for stand step transfer by taking a few short, shuffling steps with RW. Noted very minimal RLE weight bearing and limited weight shift to right. Pt demonstrated posterior bias in standing. She continues to be anxious about mobility due to hip pain. Current plan remains appropriate. Will continue to follow acutely and progress as tolerated.     Follow Up Recommendations  SNF;Supervision/Assistance - 24 hour     Equipment Recommendations  Other (comment)(TBD)    Recommendations for Other Services       Precautions / Restrictions Precautions Precautions: Fall Precaution Comments: watch O2 saturations patient on ATC/trach Restrictions Weight Bearing Restrictions: No RLE Weight Bearing: Weight bearing as tolerated    Mobility  Bed Mobility Overal bed mobility: Needs Assistance Bed Mobility: Rolling;Supine to Sit Rolling: Mod assist   Supine to sit: Max assist     General bed mobility comments: Max assist to sit EOB. pt able to initiate LE movement but limited by pain, able to assist to push up with elbow to elevate trunk  Transfers Overall transfer level: Needs assistance Equipment used: Rolling walker (2 wheeled) Transfers: Sit to/from [facility] Sit to Stand: Min assist;+2 physical  assistance Stand pivot transfers: Mod assist;+2 physical assistance       General transfer comment: sit<>stand min assist +2 for power up and steadying of pt and RW. noted posterior bias. VCs for hand placement. stand pivot transfer mod assist +2 for weight shift, sequencing, balance, and management of RW. Pt able to take a few small shuffling steps to sit in recliner chair with very minimal WB through RLE. increased time required  Ambulation/Gait             General Gait Details: did not perform    Stairs            Wheelchair Mobility    Modified Rankin (Stroke Patients Only)       Balance Overall balance assessment: History of Falls;Needs assistance Sitting-balance support: Feet unsupported;Bilateral upper extremity supported Sitting balance-Leahy Scale: Fair     Standing balance support: During functional activity;Bilateral upper extremity supported Standing balance-Leahy Scale: Poor Standing balance comment: posterior bias in standing noted. requires bil UE and external support for balance.                            Cognition Arousal/Alertness: Awake/alert Behavior During Therapy: Anxious Overall Cognitive Status: Difficult to assess                                        Exercises      General Comments General comments (skin integrity, edema, etc.): O2 92% or greater on RA      Pertinent Vitals/Pain Pain  Assessment: Faces Faces Pain Scale: Hurts little more Pain Location: right LE hip and neck Pain Descriptors / Indicators: Aching Pain Intervention(s): Monitored during session;Repositioned;Patient requesting pain meds-RN notified    Home Living                      Prior Function            PT Goals (current goals can now be found in the care plan section) Acute Rehab PT Goals Patient Stated Goal: to be able to walk PT Goal Formulation: With patient/family Time For Goal Achievement: 06/13/17 Potential  to Achieve Goals: Fair Progress towards PT goals: Progressing toward goals    Frequency    Min 5X/week      PT Plan Current plan remains appropriate    Co-evaluation              AM-PAC PT "6 Clicks" Daily Activity  Outcome Measure  Difficulty turning over in bed (including adjusting bedclothes, sheets and blankets)?: Unable Difficulty moving from lying on back to sitting on the side of the bed? : Unable Difficulty sitting down on and standing up from a chair with arms (e.g., wheelchair, bedside commode, etc,.)?: Unable Help needed moving to and from a bed to chair (including a wheelchair)?: A Lot Help needed walking in hospital room?: A Lot Help needed climbing 3-5 steps with a railing? : Total 6 Click Score: 8    End of Session Equipment Utilized During Treatment: Gait belt Activity Tolerance: Patient tolerated treatment well;Patient limited by pain Patient left: in chair;with call bell/phone within reach;with chair alarm set;with family/visitor present(reapplied oxygen 28% FiO2 ATC) Nurse Communication: Mobility status PT Visit Diagnosis: Muscle weakness (generalized) (M62.81);History of falling (Z91.81);Pain;Difficulty in walking, not elsewhere classified (R26.2) Pain - Right/Left: Right Pain - part of body: Hip     Time: 9629-5284 PT Time Calculation (min) (ACUTE ONLY): 17 min  Charges:  $Therapeutic Activity: 8-22 mins                    G Codes:       Vic Ripper, SPT   Vic Ripper 06/01/2017, 12:51 PM

## 2017-06-01 NOTE — Progress Notes (Signed)
Modified Barium Swallow Progress Note  Patient Details  Name: Naraya Stoneberg MRN: [id number] Date of Birth: 03/16/1950  Today's Date: 06/01/2017  Modified Barium Swallow completed.  Full report located under Chart Review in the Imaging Section.  Brief recommendations include the following:  Clinical Impression  Pt demonstrates a mild pharyngeal dysphagia in the setting of known epiglottic mass that appears to obstruct the majority of the vestibule. Pt did have some mild penetration around the mass, which was ejected with subsequent swallows. Mild residuals also observed in pyriforms which cleared with second swallows. Pt is at low risk of aspiration at this phase of her condition given the obstuction of the trachea. Pt may initiate a dys 3 mechanical soft diet (no dentition) and thin liquids until any treatment of mass begins. Radiation or surgery will change function and retesting would be warranted.    Swallow Evaluation Recommendations   Recommended Consults: Consider ENT evaluation   SLP Diet Recommendations: Dysphagia 3 (Mech soft) solids;Thin liquid   Liquid Administration via: Cup;Straw   Medication Administration: Whole meds with puree   Supervision: Patient able to self feed       Postural Changes: Seated upright at 90 degrees            Ailene Royal, Katherene Ponto 06/01/2017,2:18 PM

## 2017-06-02 ENCOUNTER — Inpatient Hospital Stay (HOSPITAL_COMMUNITY): Payer: Medicare HMO

## 2017-06-02 ENCOUNTER — Encounter (HOSPITAL_COMMUNITY): Payer: Self-pay | Admitting: Orthopedic Surgery

## 2017-06-02 LAB — BASIC METABOLIC PANEL
Anion gap: 7 (ref 5–15)
BUN: 10 mg/dL (ref 6–20)
CALCIUM: 8.8 mg/dL — AB (ref 8.9–10.3)
CO2: 27 mmol/L (ref 22–32)
Chloride: 102 mmol/L (ref 101–111)
Creatinine, Ser: 0.68 mg/dL (ref 0.44–1.00)
GFR calc Af Amer: >60 mL/min (ref >60)
Glucose, Bld: 92 mg/dL (ref 65–99)
POTASSIUM: 3.8 mmol/L (ref 3.5–5.1)
SODIUM: 136 mmol/L (ref 135–145)

## 2017-06-02 LAB — CBC WITH DIFFERENTIAL/PLATELET
BASOS ABS: 0 10*3/uL (ref 0.0–0.1)
BASOS PCT: 0 %
EOS ABS: 0.1 10*3/uL (ref 0.0–0.7)
EOS PCT: 1 %
HCT: 29.6 % — ABNORMAL LOW (ref 36.0–46.0)
Hemoglobin: 9.1 g/dL — ABNORMAL LOW (ref 12.0–15.0)
LYMPHS PCT: 26 %
Lymphs Abs: 1.5 10*3/uL (ref 0.7–4.0)
MCH: 26.1 pg (ref 26.0–34.0)
MCHC: 30.7 g/dL (ref 30.0–36.0)
MCV: 85.1 fL (ref 78.0–100.0)
Monocytes Absolute: 0.5 10*3/uL (ref 0.1–1.0)
Monocytes Relative: 9 %
Neutro Abs: 3.7 10*3/uL (ref 1.7–7.7)
Neutrophils Relative %: 64 %
PLATELETS: 212 10*3/uL (ref 150–400)
RBC: 3.48 MIL/uL — AB (ref 3.87–5.11)
RDW: 17.5 % — ABNORMAL HIGH (ref 11.5–15.5)
WBC: 5.8 10*3/uL (ref 4.0–10.5)

## 2017-06-02 MED ORDER — PANTOPRAZOLE SODIUM 40 MG PO PACK
40.0000 mg | PACK | Freq: Every day | ORAL | Status: DC
  Administered 2017-06-03 – 2017-06-10 (×7): 40 mg
  Filled 2017-06-02 (×9): qty 20

## 2017-06-02 MED ORDER — ENSURE ENLIVE PO LIQD
237.0000 mL | Freq: Two times a day (BID) | ORAL | Status: DC
  Administered 2017-06-02 – 2017-06-05 (×5): 237 mL via ORAL

## 2017-06-02 MED ORDER — CHLORHEXIDINE GLUCONATE 0.12 % MT SOLN
OROMUCOSAL | Status: AC
  Administered 2017-06-02: 15 mL via OROMUCOSAL
  Filled 2017-06-02: qty 15

## 2017-06-02 MED ORDER — IOPAMIDOL (ISOVUE-300) INJECTION 61%
100.0000 mL | Freq: Once | INTRAVENOUS | Status: AC | PRN
  Administered 2017-06-02: 75 mL via INTRAVENOUS

## 2017-06-02 MED ORDER — IOPAMIDOL (ISOVUE-300) INJECTION 61%
INTRAVENOUS | Status: AC
  Filled 2017-06-02: qty 50

## 2017-06-02 NOTE — Plan of Care (Signed)
Pt on trach collar w/ FiO2 28% x 3 days. Pt able to tolerate mild activity (I.e. Transfer to/from bed and chair). Pt able to nod/gesture appropriately, write and mouth words.

## 2017-06-02 NOTE — Progress Notes (Signed)
PULMONARY / CRITICAL CARE MEDICINE   Name: Katelyn [name] MRN: [id number] DOB: 1950/12/03    ADMISSION DATE:  05/29/2017 CONSULTATION DATE:  05/29/2017   REFERRING MD:  Dr. Stann Mainland   CHIEF COMPLAINT:  S/P Tracheostomy   HISTORY OF PRESENT ILLNESS:   67 year old female with PMH of CVA, DVT, HTN, Epiglottic Lesion. Baseline uses pampers, requires assistance feeding, with chronic right-sided weakness secondary to CVA, non-compliant with walker     Presents to ED on 4/5 with right hip pain s/p fall. Take to OR for Intramedullary Nail Intertrochanteric Femur. Underwent Spinal Block for procedure. Was given 25 mcg fentanyl, bupivacaine, and 10 mg propofol. Tolerated procedure well and taken to PACU. In PACU patient was noted to be obtunded and hypoxic with saturation 70-80 despite supplemental supplemental oxygen and attempted bagging. Taken to OR for intubated due to epiglottic mass. Unable to place ETT so Tracheostomy was placed  4/7 The patient is in trach collar and appears quite comfortable. She pulled out her NGT yesterday. We are waiting for IR to place a clortrack tomorrow. . According to her family thepatienthad been having trouble swallowing for a while and losing weight at home.  4/8 Her urine output was scant last night so she was administered a bolus. Will start low dose iV fluids until patient has placement of feeding tube. She is awake , a;lert and cooperative. Can likely leave the ICU environment.  4/9 Patient looks well. She was okayed for mechanical soft diet. I have asked ENT to formally see the patient. In all likelihood her condition can be downgraded today.  Marland Kitchen    PAST MEDICAL HISTORY :  She  has a past medical history of Acute gallstone pancreatitis (01/08/2014), AKI (acute kidney injury) ([place]) (01/08/2014), Anemia, Blood transfusion, Cholecystitis, Hypercholesteremia, Hypertension, Right leg DVT ([place]) (05/08/2014), Septic shock - Kelbsiella (01/09/2014), Shortness of  breath, and Stroke ([place]).  PAST SURGICAL HISTORY: She  has a past surgical history that includes Abdominal hysterectomy; TEE without cardioversion (03/06/2011); Endoscopic retrograde cholangiopancreatography (ercp) with propofol (N/A, 01/11/2014); ERCP; ERCP (N/A, 05/26/2014); EUS (N/A, 12/07/2014); Esophagogastroduodenoscopy (egd) with propofol (N/A, 12/07/2014); Gastrointestinal stent removal (N/A, 12/07/2014); and Percutaneous tracheostomy (N/A, 05/29/2017).  No Known Allergies  No current facility-administered medications on file prior to encounter.    Current Outpatient Medications on File Prior to Encounter  Medication Sig  . acetaminophen (TYLENOL) 325 MG tablet Take by mouth as needed for mild pain or headache.  . carvedilol (COREG) 12.5 MG tablet Take 1 tablet (12.5 mg total) by mouth 2 (two) times daily with a meal. (Patient not taking: Reported on 05/29/2017)  . furosemide (LASIX) 20 MG tablet Take 1 tablet (20 mg total) by mouth daily. (Patient not taking: Reported on 05/29/2017)  . potassium chloride 20 MEQ TBCR Take 20 mEq by mouth daily. (Patient not taking: Reported on 05/29/2017)       VITAL SIGNS: BP 122/76   Pulse 67   Temp 97.7 F (36.5 C) (Oral)   Resp (!) 8   Ht 4\' 7"  (1.397 m)   Wt 93 lb 0.6 oz (42.2 kg)   SpO2 100%   BMI 21.62 kg/m   PHYSICAL EXAMINATION: General:  Thin,Elderly female, no distress. Appears comfortable Neuro:  alert, follows commands  HEENT:  Trach in place, n  Cardiovascular:  RRR, no MRG  Lungs:  Coarse rhonchi anteriorly, minimal secretions   Abdomen:  non-distended, non-tender   Skin:  Warm, dry, intact  Extr: no edema  LABS:  BMET  Recent Labs  Lab 05/30/17 0341 05/31/17 0313 06/02/17 0308  NA 137 139 136  K 3.5 4.4 3.8  CL 98* 102 102  CO2 25 25 27   BUN 23* 20 10  CREATININE 0.94 0.86 0.68  GLUCOSE 98 71 92    Electrolytes Recent Labs  Lab 05/29/17 2100 05/30/17 0341 05/31/17 0313 06/02/17 0308  CALCIUM 9.0 8.7* 8.9  8.8*  MG 1.9 1.8  --   --   PHOS 5.0* 2.2*  --   --     CBC Recent Labs  Lab 05/30/17 0341 05/31/17 0313 06/02/17 0308  WBC 12.1* 8.5 5.8  HGB 6.3* 9.5* 9.1*  HCT 20.8* 30.0* 29.6*  PLT 226 211 212    Coag's Recent Labs  Lab 05/29/17 1526 05/29/17 2100  INR 1.14 1.15    Sepsis Markers No results for input(s): LATICACIDVEN, PROCALCITON, O2SATVEN in the last 168 hours.  ABG Recent Labs  Lab 05/29/17 2145 05/29/17 2350 05/30/17 0447  PHART 7.294* 7.416 7.496*  PCO2ART 61.8* 47.0 35.7  PO2ART 54.0* 265.0* 126.0*    Liver Enzymes Recent Labs  Lab 05/29/17 1143 05/29/17 2100  AST 19 30  ALT 13* 14  ALKPHOS 72 66  BILITOT 0.8 0.5  ALBUMIN 2.6* 2.3*    Cardiac Enzymes No results for input(s): TROPONINI, PROBNP in the last 168 hours.  Glucose Recent Labs  Lab 05/30/17 0404 05/30/17 0714  GLUCAP 88 94    Imaging Dg Swallowing Func-speech Pathology  Result Date: 06/01/2017 Objective Swallowing Evaluation: Type of Study: MBS-Modified Barium Swallow Study  Patient Details Name: Katelyn [name] MRN: [id number] Date of Birth: 1950-11-02 Today's Date: 06/01/2017 Time: SLP Start Time (ACUTE ONLY): 1325 -SLP Stop Time (ACUTE ONLY): 1346 SLP Time Calculation (min) (ACUTE ONLY): 21 min Past Medical History: Past Medical History: Diagnosis Date . Acute gallstone pancreatitis 01/08/2014 . AKI (acute kidney injury) ([place]) 01/08/2014 . Anemia  . Blood transfusion   11'15 last admission . Cholecystitis  . Hypercholesteremia  . Hypertension  . Right leg DVT ([place]) 05/08/2014 . Septic shock - Kelbsiella 01/09/2014 . Shortness of breath  . Stroke [facility])   '13- right side weakness, ambulates with cane Past Surgical History: Past Surgical History: Procedure Laterality Date . ABDOMINAL HYSTERECTOMY   . ENDOSCOPIC RETROGRADE CHOLANGIOPANCREATOGRAPHY (ERCP) WITH PROPOFOL N/A 01/11/2014  Procedure: ENDOSCOPIC RETROGRADE CHOLANGIOPANCREATOGRAPHY (ERCP) WITH PROPOFOL;  Surgeon: Gatha Mayer,  MD;  Location: [place];  Service: Endoscopy;  Laterality: N/A; . ERCP   . ERCP N/A 05/26/2014  Procedure: ENDOSCOPIC RETROGRADE CHOLANGIOPANCREATOGRAPHY (ERCP);  Surgeon: Gatha Mayer, MD;  Location: [facility];  Service: Endoscopy;  Laterality: N/A; . ESOPHAGOGASTRODUODENOSCOPY (EGD) WITH PROPOFOL N/A 12/07/2014  Procedure: ESOPHAGOGASTRODUODENOSCOPY (EGD) WITH PROPOFOL;  Surgeon: Milus Banister, MD;  Location: [facility];  Service: Endoscopy;  Laterality: N/A; . EUS N/A 12/07/2014  Procedure: UPPER ENDOSCOPIC ULTRASOUND (EUS) LINEAR;  Surgeon: Milus Banister, MD;  Location: [facility];  Service: Endoscopy;  Laterality: N/A; . GASTROINTESTINAL STENT REMOVAL N/A 12/07/2014  Procedure: GASTROINTESTINAL STENT REMOVAL;  Surgeon: Milus Banister, MD;  Location: [facility];  Service: Endoscopy;  Laterality: N/A;  pancreatic stent removal . TEE WITHOUT CARDIOVERSION  03/06/2011  Procedure: TRANSESOPHAGEAL ECHOCARDIOGRAM (TEE);  Surgeon: Jolaine Artist, MD;  Location: [facility];  Service: Cardiovascular;  Laterality: N/A; HPI: 29yoF with hx CVA, DVT (2016), HTN, Anemia, Gallstone pancreatitis, and Epiglottis mass, who presented with a hip fracture, now s/p operative repair. Intubated for procedure; postop patient developed respiratory distress and required emergent trach.  Subjective: Family reports weight loss Assessment / Plan / Recommendation CHL IP CLINICAL IMPRESSIONS 06/01/2017 Clinical Impression Pt demonstrates a mild pharyngeal dysphagia in the setting of known epiglottic mass that appears to obstruct the majority of the vestibule. Pt did have some mild penetration around the mass, which was ejected with subsequent swallows. Mild residuals also observed in pyriforms which cleared with second swallows. Pt is at low risk of aspiration at this phase of her condition given the obstuction of the trachea. Pt may initiate a dys 3 mechanical soft diet (no dentition) and thin liquids until any treatment  of mass begins. Radiation or surgery will change function and retesting would be warranted.  SLP Visit Diagnosis Dysphagia, oropharyngeal phase (R13.12) Attention and concentration deficit following -- Frontal lobe and executive function deficit following -- Impact on safety and function Mild aspiration risk   CHL IP TREATMENT RECOMMENDATION 06/01/2017 Treatment Recommendations Therapy as outlined in treatment plan below   Prognosis 06/01/2017 Prognosis for Safe Diet Advancement Good Barriers to Reach Goals -- Barriers/Prognosis Comment -- CHL IP DIET RECOMMENDATION 06/01/2017 SLP Diet Recommendations Dysphagia 3 (Mech soft) solids;Thin liquid Liquid Administration via Cup;Straw Medication Administration Whole meds with puree Compensations -- Postural Changes Seated upright at 90 degrees   CHL IP OTHER RECOMMENDATIONS 06/01/2017 Recommended Consults Consider ENT evaluation Oral Care Recommendations -- Other Recommendations --   CHL IP FOLLOW UP RECOMMENDATIONS 06/01/2017 Follow up Recommendations Outpatient SLP   CHL IP FREQUENCY AND DURATION 06/01/2017 Speech Therapy Frequency (ACUTE ONLY) min 2x/week Treatment Duration 1 week      CHL IP ORAL PHASE 06/01/2017 Oral Phase WFL Oral - Pudding Teaspoon -- Oral - Pudding Cup -- Oral - Honey Teaspoon -- Oral - Honey Cup -- Oral - Nectar Teaspoon -- Oral - Nectar Cup -- Oral - Nectar Straw -- Oral - Thin Teaspoon -- Oral - Thin Cup -- Oral - Thin Straw -- Oral - Puree -- Oral - Mech Soft -- Oral - Regular -- Oral - Multi-Consistency -- Oral - Pill -- Oral Phase - Comment --  CHL IP PHARYNGEAL PHASE 06/01/2017 Pharyngeal Phase Impaired Pharyngeal- Pudding Teaspoon -- Pharyngeal -- Pharyngeal- Pudding Cup -- Pharyngeal -- Pharyngeal- Honey Teaspoon -- Pharyngeal -- Pharyngeal- Honey Cup -- Pharyngeal -- Pharyngeal- Nectar Teaspoon -- Pharyngeal -- Pharyngeal- Nectar Cup -- Pharyngeal -- Pharyngeal- Nectar Straw -- Pharyngeal -- Pharyngeal- Thin Teaspoon -- Pharyngeal -- Pharyngeal- Thin  Cup Pharyngeal residue - pyriform;Penetration/Aspiration during swallow Pharyngeal Material enters airway, remains ABOVE vocal cords then ejected out;Material does not enter airway Pharyngeal- Thin Straw Pharyngeal residue - pyriform;Penetration/Aspiration during swallow Pharyngeal Material does not enter airway;Material enters airway, remains ABOVE vocal cords then ejected out Pharyngeal- Puree Pharyngeal residue - pyriform Pharyngeal -- Pharyngeal- Mechanical Soft -- Pharyngeal -- Pharyngeal- Regular Pharyngeal residue - pyriform Pharyngeal -- Pharyngeal- Multi-consistency -- Pharyngeal -- Pharyngeal- Pill -- Pharyngeal -- Pharyngeal Comment --  No flowsheet data found. No flowsheet data found. DeBlois, Katherene Ponto 06/01/2017, 2:18 PM                STUDIES:  XRay Right Hip 4/5 > Intertrochanteric femur fracture on the right with varus angulation at the fracture site. No other fracture. No dislocation. Mild symmetric narrowing of each hip joint. CXR 4/5 > No edema or consolidation. Heart size and pulmonary vascularity are normal. No adenopathy. There is aortic atherosclerosis. No pneumothorax. No bone lesions CT Hip 4/5 > Acute right intertrochanteric fracture as described above. No other acute abnormality   DISCUSSION: 68  year old female presents s/p Fall with right hip fracture. Taken to OR. In PACU patient went into respiratory distress requiring tracheostomy placement in OR.   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  -Reportedly was told to follow up for mass but did not  Patient on trach collar.  Her 02 sats are in the high 90s on low flow 02 Patient could not manage to phonate with the a PASSY MEIR VALVE. uNCLEAR TO WHAT EXTENT THE EPIGLOTTIC MASS MAY BE INTERFERING. No major resp. secretions  Diastolic Heart Failure (EF 55, G1DD)  H/O HTN  P:  Cardiac Monitoring  Hold Coreg/Lasix    CKD  the patient has some history  of CKD. Howver current GFR estiamated > 60 presently. aS NOTED ABOVE I WOILL INCREASE iv FLUIDS FOR NOW . Urine output has picked up a bit. Her creatinine remains stable   Anemia of Chronic Disease  HB 9.1 her Hb was only 8.4 on presentation. 3 years ago it was close to 11. Iron studies suggest chronic disease   Right Hip Fracture s/p IM Nail secondary to Fall  Plan: Per Ortho   Impression The patient is doing well. Had her hip repaired. Await input form ENT. Will need biopsy and definitive plan for the epiglottic mass. The patient was approved for a dysphagia diet. Can likely be downgraded today.

## 2017-06-02 NOTE — Progress Notes (Signed)
Nutrition Follow-up  DOCUMENTATION CODES:   Non-severe (moderate) malnutrition in context of chronic illness  INTERVENTION:   Ensure Enlive po BID, each supplement provides 350 kcal and 20 grams of protein  Magic cup TID with meals, each supplement provides 290 kcal and 9 grams of protein   NUTRITION DIAGNOSIS:   Moderate Malnutrition related to chronic illness(esophageal lesion with associated restricted PO intakes) as evidenced by mild fat depletion, mild muscle depletion. Ongoing.   GOAL:   Patient will meet greater than or equal to 90% of their needs Progressing.   MONITOR:   PO intake, Supplement acceptance, Skin  ASSESSMENT:   67 year old female with PMH of CVA, DVT, HTN, epiglottic lesion. At baseline she uses Pampers, requires assistance feeding and has chronic right-sided weakness secondary to CVA, non-compliant with walker. She presented to the ED on 4/5 with R hip pain s/p fall and was subsequently taken to the OR for Intramedullary Nail Intertrochanteric Femur. She tolerated procedure well and was taken to PACU. In PACU patient was noted to be obtunded and hypoxic with saturation 70-80 despite supplemental oxygen; bagging attempted. Taken back to the OR for intubation due to epiglottic mass. Unable to place ETT so tracheostomy was placed.   Pt discussed during ICU rounds and with RN.  ENT eval pending 4/5 trach   Meal Completion: 10% Per pt she can feed herself. Breakfast was in front of patient with only a couple of bites consumed. Pt reports no issues eating and said she wanted to eat but was not clear as to why she did not. She is difficult to understand and did not want to write.    Medications reviewed and include: colace, miralax Labs reviewed   Diet Order:  DIET DYS 3 Room service appropriate? Yes; Fluid consistency: Thin  EDUCATION NEEDS:   No education needs have been identified at this time  Skin:  Skin Assessment: Skin Integrity Issues: Skin  Integrity Issues:: Stage II, Incisions Stage II: sacrum Incisions: R hip and neck 4/5  Last BM:  PTA/unknown  Height:   Ht Readings from Last 1 Encounters:  05/29/17 4\' 7"  (1.397 m)    Weight:   Wt Readings from Last 1 Encounters:  06/02/17 93 lb 0.6 oz (42.2 kg)    Ideal Body Weight:  40 kg  BMI:  Body mass index is 21.62 kg/m.  Estimated Nutritional Needs:   Kcal:  1300-1500  Protein:  60-75 grams  Fluid:  >/= 1.5 L/day  Maylon Peppers RD, LDN, CNSC [phone] Pager [phone] After Hours Pager

## 2017-06-02 NOTE — Progress Notes (Signed)
Physical Therapy Treatment Patient Details Name: Katelyn [name] MRN: [id number] DOB: 1951/01/09 Today's Date: 06/02/2017    History of Present Illness This 67 y.o. female admitted after falling asleep at her table and falling out of the chair.  She sustained Rt femur fx and underwent Im nail.  Post operatively, she developed respiratory distress and required an emergent trach due to subglottic mass.  PMH includes:  Diastolic HF, CKD stage 3, h/o CVA with Rt sided weakness     PT Comments    Pt pleasant but slow to respond to cues and questions. Pt reports right hip pain and only willing to attempt to stand 1x and perform HEP despite education for benefit and need to increase mobility to return home and decrease burden of care. In standing pt maintains flexed posture with heavy reliance on bil UE to support self in standing and limited stance on RLE. Pt will require 2 person assist for attempts at stepping away from surface.     Follow Up Recommendations  SNF;Supervision/Assistance - 24 hour     Equipment Recommendations  Wheelchair (measurements PT);Wheelchair cushion (measurements PT)    Recommendations for Other Services       Precautions / Restrictions Precautions Precautions: Fall Restrictions Weight Bearing Restrictions: No RLE Weight Bearing: Weight bearing as tolerated    Mobility  Bed Mobility               General bed mobility comments: in chair on arrival  Transfers Overall transfer level: Needs assistance   Transfers: Sit to/from Stand Sit to Stand: Mod assist         General transfer comment: mod assist to stand with cues for hand placement, assist to transition to RW and increased time. min assist to scoot to Edge of chair mod I to scoot back  Ambulation/Gait             General Gait Details: pt refused attempting   Stairs            Wheelchair Mobility    Modified Rankin (Stroke Patients Only)       Balance                                             Cognition Arousal/Alertness: Awake/alert Behavior During Therapy: Flat affect Overall Cognitive Status: No family/caregiver present to determine baseline cognitive functioning                                 General Comments: pt disoriented to day, slow to process and appears internally distracted      Exercises General Exercises - Lower Extremity Long Arc Quad: AROM;15 reps;Seated;Right Hip Flexion/Marching: AAROM;Right;15 reps;Seated    General Comments        Pertinent Vitals/Pain Pain Assessment: 0-10 Pain Score: 7  Pain Location: right hip Pain Descriptors / Indicators: Aching Pain Intervention(s): Limited activity within patient's tolerance;Repositioned;Premedicated before session;Monitored during session    Home Living                      Prior Function            PT Goals (current goals can now be found in the care plan section) Progress towards PT goals: Progressing toward goals(very limited)    Frequency    Min 3X/week  PT Plan Current plan remains appropriate;Frequency needs to be updated    Co-evaluation              AM-PAC PT "6 Clicks" Daily Activity  Outcome Measure  Difficulty turning over in bed (including adjusting bedclothes, sheets and blankets)?: Unable Difficulty moving from lying on back to sitting on the side of the bed? : Unable Difficulty sitting down on and standing up from a chair with arms (e.g., wheelchair, bedside commode, etc,.)?: Unable Help needed moving to and from a bed to chair (including a wheelchair)?: A Lot Help needed walking in hospital room?: Total Help needed climbing 3-5 steps with a railing? : Total 6 Click Score: 7    End of Session Equipment Utilized During Treatment: Gait belt;Oxygen Activity Tolerance: Patient limited by pain Patient left: in chair;with call bell/phone within reach Nurse Communication: Mobility status PT  Visit Diagnosis: Muscle weakness (generalized) (M62.81);History of falling (Z91.81);Pain;Difficulty in walking, not elsewhere classified (R26.2)     Time: 7681-1572 PT Time Calculation (min) (ACUTE ONLY): 20 min  Charges:  $Therapeutic Activity: 8-22 mins                    G Codes:       Elwyn Reach, PT [phone]    Dezi Brauner B Draycen Leichter 06/02/2017, 11:25 AM

## 2017-06-02 NOTE — Consult Note (Signed)
[name]  Referring Physician: Dr. Marijean Niemann Primary Care Physician: Aretta Nip, MD Patient Location at Initial Consult: Inpatient Service: ICU Chief Complaint/Reason for Consult: epiglottic mass  History of Presenting Illness:  History obtained from chart, referring physician, patient.  Katelyn [name] is a  67 y.o. female presenting with an epiglottis mass. She states she was told that she had a mass a couple of years ago. Has not had any shortness of breath at home. She presented a few days ago after a fall and underwent intermedullary nail of her right femur- this was done using a spinal block. In PACU on 05/29/17, she began to have acute desaturation, shortness of breath. She was unable to be re-intubated due to a large, exophytic epiglottic mass. A PERC trach was placed emergently by General Surgery. She now has a #6 PERC trach in place (Cuff down) but has been unable to tolerate PMV due to shortness of breath. Tolerating diet. Off ventilator.   In recent months, she notes weight loss. Denies dysphagia, otalgia, neck masses, dysphonia, shortness of breath. She states she was previously able to lie flat and eat a normal diet. +Smoker, denies EtOH. Had CVA in 2013, walks with a cane. On chronic anticoagulation per patient, but I do not see these in her medication list.   Past Medical History:  Diagnosis Date  . Acute gallstone pancreatitis 01/08/2014  . AKI (acute kidney injury) ([place]) 01/08/2014  . Anemia   . Blood transfusion    11'15 last admission  . Cholecystitis   . Hypercholesteremia   . Hypertension   . Right leg DVT ([place]) 05/08/2014  . Septic shock - Kelbsiella 01/09/2014  . Shortness of breath   . Stroke [facility])    '13- right side weakness, ambulates with cane    Past Surgical History:  Procedure Laterality Date  . ABDOMINAL HYSTERECTOMY    . ENDOSCOPIC RETROGRADE CHOLANGIOPANCREATOGRAPHY (ERCP) WITH PROPOFOL  N/A 01/11/2014   Procedure: ENDOSCOPIC RETROGRADE CHOLANGIOPANCREATOGRAPHY (ERCP) WITH PROPOFOL;  Surgeon: Gatha Mayer, MD;  Location: [facility];  Service: Endoscopy;  Laterality: N/A;  . ERCP    . ERCP N/A 05/26/2014   Procedure: ENDOSCOPIC RETROGRADE CHOLANGIOPANCREATOGRAPHY (ERCP);  Surgeon: Gatha Mayer, MD;  Location: [facility];  Service: Endoscopy;  Laterality: N/A;  . ESOPHAGOGASTRODUODENOSCOPY (EGD) WITH PROPOFOL N/A 12/07/2014   Procedure: ESOPHAGOGASTRODUODENOSCOPY (EGD) WITH PROPOFOL;  Surgeon: Milus Banister, MD;  Location: [facility];  Service: Endoscopy;  Laterality: N/A;  . EUS N/A 12/07/2014   Procedure: UPPER ENDOSCOPIC ULTRASOUND (EUS) LINEAR;  Surgeon: Milus Banister, MD;  Location: [facility];  Service: Endoscopy;  Laterality: N/A;  . GASTROINTESTINAL STENT REMOVAL N/A 12/07/2014   Procedure: GASTROINTESTINAL STENT REMOVAL;  Surgeon: Milus Banister, MD;  Location: [facility];  Service: Endoscopy;  Laterality: N/A;  pancreatic stent removal  . PERCUTANEOUS TRACHEOSTOMY N/A 05/29/2017   Procedure: PERCUTANEOUS TRACHEOSTOMY;  Surgeon: Judeth Horn, MD;  Location: [facility];  Service: General;  Laterality: N/A;  . TEE WITHOUT CARDIOVERSION  03/06/2011   Procedure: TRANSESOPHAGEAL ECHOCARDIOGRAM (TEE);  Surgeon: Jolaine Artist, MD;  Location[facility];  Service: Cardiovascular;  Laterality: N/A;    Family History  Problem Relation Age of Onset  . Breast cancer Maternal Aunt   . Diabetes Mellitus I Father   . Diabetes Mellitus I Sister   . Anesthesia problems Neg Hx   . Hypotension Neg Hx   . Malignant hyperthermia Neg Hx   . Pseudochol deficiency Neg  Hx   . Colon cancer Neg Hx   . Esophageal cancer Neg Hx   . Pancreatic cancer Neg Hx   . Kidney disease Neg Hx   . Liver disease Neg Hx     Social History   Socioeconomic History  . Marital status: Widowed    Spouse name: Not on file  . Number of children: Not on file  . Years of education: Not on  file  . Highest education level: Not on file  Occupational History  . Occupation: Retired  Scientific laboratory technician  . Financial resource strain: Not on file  . Food insecurity:    Worry: Not on file    Inability: Not on file  . Transportation needs:    Medical: Not on file    Non-medical: Not on file  Tobacco Use  . Smoking status: Former Smoker    Packs/day: 0.25    Types: Cigarettes    Last attempt to quit: 02/15/2013    Years since quitting: 4.2  . Smokeless tobacco: Never Used  Substance and Sexual Activity  . Alcohol use: No    Alcohol/week: 0.0 oz  . Drug use: No  . Sexual activity: Not on file  Lifestyle  . Physical activity:    Days per week: Not on file    Minutes per session: Not on file  . Stress: Not on file  Relationships  . Social connections:    Talks on phone: Not on file    Gets together: Not on file    Attends religious service: Not on file    Active member of club or organization: Not on file    Attends meetings of clubs or organizations: Not on file    Relationship status: Not on file  Other Topics Concern  . Not on file  Social History Narrative  . Not on file    No current facility-administered medications on file prior to encounter.    Current Outpatient Medications on File Prior to Encounter  Medication Sig Dispense Refill  . acetaminophen (TYLENOL) 325 MG tablet Take by mouth as needed for mild pain or headache.    . carvedilol (COREG) 12.5 MG tablet Take 1 tablet (12.5 mg total) by mouth 2 (two) times daily with a meal. (Patient not taking: Reported on 05/29/2017) 60 tablet 1  . furosemide (LASIX) 20 MG tablet Take 1 tablet (20 mg total) by mouth daily. (Patient not taking: Reported on 05/29/2017) 30 tablet 1  . potassium chloride 20 MEQ TBCR Take 20 mEq by mouth daily. (Patient not taking: Reported on 05/29/2017) 30 tablet 1    No Known Allergies   Review of Systems: Complete ROS, positives as noted in HPI.    OBJECTIVE: Vital Signs: Vitals:    06/02/17 0820 06/02/17 0900  BP:  134/80  Pulse: 67 63  Resp: (!) 8 (!) 7  Temp:    SpO2:  96%    I&O  Intake/Output Summary (Last 24 hours) at 06/02/2017 0959 Last data filed at 06/02/2017 0900 Gross per 24 hour  Intake 1950 ml  Output 575 ml  Net 1375 ml    Physical Exam General: Thin, somewhat frail appearing. Sitting up in chair. No acute distress.  Alert and very cooperative, pleasant demeanor.   Head/Face: Normocephalic, atraumatic. No scars or lesions. No sinus tenderness. Facial nerve intact and equal bilaterally.  No facial lacerations. Salivary glands non tender and without palpable masses  Eyes: Globes well positioned, no proptosis Lids: No periorbital edema/ecchymosis. No lid laceration  Conjunctiva: No chemosis, hemorrhage EOMI, PERRL  Ears: No gross deformity. Normal external canal.    Hearing:  Normal speech reception.  Nose: No gross deformity or lesions. No purulent discharge. Septum midline. No turbinate hypertrophy.  Mouth/Oropharynx: Lips without any lesions. Edentulous. No mucosal lesions within the oropharynx. No tonsillar enlargement, exudate, or lesions. Pharyngeal walls symmetrical. Uvula midline. Tongue midline without lesions.  Larynx: See TFL  Nasopharynx: See TFL  Neck: Thin neck.  Trachea midline. No masses. No thyromegaly or nodules palpated. No crepitus.  6 PERC trach in place, stoma site looks clean. Cuff deflated. Minimal tolerance of finger-plugging. Aphonic voice.   Lymphatic: No lymphadenopathy in the neck.  Respiratory: No stridor or distress.  Cardiovascular: Regular rate and rhythm.  Extremities: No edema or cyanosis. Warm and well-perfused.  Skin: No scars or lesions on face or neck.  Neurologic: CN II-XII intact. Moving all extremities without gross abnormality.  Other:      Labs: Lab Results  Component Value Date   WBC 5.8 06/02/2017   HGB 9.1 (L) 06/02/2017   HCT 29.6 (L) 06/02/2017   PLT 212 06/02/2017   CHOL 259 (H)  03/02/2011   TRIG 153 (H) 03/02/2011   HDL 32 (L) 03/02/2011   ALT 14 05/29/2017   AST 30 05/29/2017   NA 136 06/02/2017   K 3.8 06/02/2017   CL 102 06/02/2017   CREATININE 0.68 06/02/2017   BUN 10 06/02/2017   CO2 27 06/02/2017   TSH 1.097 10/26/2007   INR 1.15 05/29/2017   HGBA1C 5.6 03/02/2011     Review of Ancillary Data / Diagnostic Tests: Reviewed OR and anesthesia notes from 05/29/17. Perc trach placed 4/5. 6 DCT PERC trach currently in place.   SLP consult notes reviewed- MBS completed- cleared for soft diet.   Procedure: Transnasal Flexible Laryngoscopy  Preoperative Diagnosis: supraglottic mass Postoperative Diagnosis: same Procedure: Transnasal fiberoptic laryngoscopy.  Estimated Blood Loss: 0 mL.  Complications: None.  Findings: The nasal cavity and nasopharynx are unremarkable. There are no suspicious findings in the nasopharynx or Fossa of Rosenmller. The tongue base is normal. The pharyngeal walls appear to be normal, but it is difficult to fully visualize. There is a large, exophytic supraglottic mass encompassing the entirety of the epiglottis. Cannot visualie pyriform sinuses or vallecula, false or true cords. No discernable airway. Did not visualize any arytenoid abduction with attempts at phonation. The post cricoid region appears patent.   Description of Procedure: With the patient in the sitting position, topical  Afrin-lidocaine mixture in an atomizer was applied to the nose. The scope was passed through the nose. Examination was carried out of the nose, nasopharynx, oropharynx, hypopharynx, and larynx with findings as noted above. Scope was removed.  The patient tolerated the procedure well.      ASSESSMENT:  67 y.o. 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 There is no discernable airway at the larynx. *Critical airway* given fresh tracheostomy and this obstructing mass. This will need to be worked up further with  appropriate imaging as well as direct laryngoscopy with biopsies in the operating room. Risks and benefits of surgery were discussed with that patient. I have also, at her request, called the patient's granddaughter to discuss and left a message with her.   RECOMMENDATIONS: -CT neck w/ contrast -CT chest w/ contrast for staging -Direct laryngoscopy with biopsy in the operating room to be scheduled in the near future (trying to schedule for 4/10 but may be  next week. This can be done as an outpatient and should not alter any DC plans) -Continue working with SLP -Will follow.     Gavin Pound, MD  [facility] [place] Office phone [phone]

## 2017-06-02 NOTE — NC FL2 (Signed)
[place] LEVEL OF CARE SCREENING TOOL     IDENTIFICATION  Patient Name: Katelyn [name] Birthdate: 1950/12/03 Sex: female Admission Date (Current Location): 05/29/2017  [facility] and [place] Number:  Herbalist and Address:  [facility], [place] [address], [place], [place] [address]      Provider Number: [id number]  Attending Physician Name and Address:  Juanito Doom, MD  Relative Name and Phone Number:  [name] granddaughter, [phone]    Current Level of Care: Hospital Recommended Level of Care: [facility] Prior Approval Number:    Date Approved/Denied:   PASRR Number: [id number]  Discharge Plan: SNF    Current Diagnoses: Patient Active Problem List   Diagnosis Date Noted  . Pressure injury of skin 05/30/2017  . Malnutrition of moderate degree 05/30/2017  . Hip fx, right, closed, initial encounter ([place]) 05/29/2017  . Closed right hip fracture ([place]) 05/29/2017  . Dysphagia 05/29/2017  . Weight loss 05/29/2017  . Difficult airway for intubation 05/29/2017  . Lesion of epiglottis 05/26/2014  . Abnormal CT scan, pancreas or bile duct   . Right leg DVT ([place]) 05/08/2014  . Anemia 01/09/2014  . Hip pain, right 05/06/2011  . 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 CKD (chronic kidney disease) stage 3, GFR 30-59 ml/min (H[facility]) 03/04/2011  . Diastolic dysfunction 25/85/2778  . Tobacco abuse 03/04/2011  . Constipation 03/04/2011  . Malignant hypertensive urgency 03/01/2011  . CVA (cerebral infarction) 03/01/2011  . NEUROPATHY, IDIOPATHIC 10/25/2007  . Hypercholesteremia 12/15/2006  . Essential hypertension 12/15/2006    Orientation RESPIRATION BLADDER Height & Weight     Self, Time, Situation, Place  (28% FiO2) Incontinent, External catheter Weight: 42.2 kg (93 lb 0.6 oz) Height:  '4\' 7"'  (139.7 cm)  BEHAVIORAL SYMPTOMS/MOOD NEUROLOGICAL BOWEL NUTRITION STATUS      Continent Diet(Please  see DC Summary)  AMBULATORY STATUS COMMUNICATION OF NEEDS Skin   Extensive Assist Verbally PU Stage and Appropriate Care, Surgical wounds(Stage II on sacrum; closed incision on hip)                       Personal Care Assistance Level of Assistance  Bathing, Feeding, Dressing Bathing Assistance: Maximum assistance Feeding assistance: Independent Dressing Assistance: Maximum assistance     Functional Limitations Info  Sight, Hearing, Speech Sight Info: Adequate Hearing Info: Adequate Speech Info: Impaired(has trach)    SPECIAL CARE FACTORS FREQUENCY  PT (By licensed PT), OT (By licensed OT)     PT Frequency: 5x wk OT Frequency: 5x wk            Contractures Contractures Info: Not present    Additional Factors Info  Code Status, Allergies Code Status Info: Full Code Allergies Info: No Known Allergies           Current Medications (06/02/2017):  This is the current hospital active medication list Current Facility-Administered Medications  Medication Dose Route Frequency Provider Last Rate Last Dose  . 0.9 %  sodium chloride infusion   Intravenous Once Mauri Brooklyn, MD      . 0.9 %  sodium chloride infusion  250 mL Intravenous PRN Tarry Kos, MD      . acetaminophen (TYLENOL) solution 650 mg  650 mg Oral Q6H PRN Hammonds, Sharyn Blitz, MD       Or  . acetaminophen (TYLENOL) suppository 650 mg  650 mg Rectal Q6H PRN Hammonds, Sharyn Blitz, MD      .  acetaminophen (TYLENOL) tablet 325-650 mg  325-650 mg Oral Q6H PRN Nicholes Stairs, MD      . albuterol (PROVENTIL) (2.5 MG/3ML) 0.083% nebulizer solution 2.5 mg  2.5 mg Nebulization Q2H PRN Hammonds, Sharyn Blitz, MD   2.5 mg at 06/01/17 0338  . bisacodyl (DULCOLAX) suppository 10 mg  10 mg Rectal Daily PRN Joette Catching T, MD      . chlorhexidine gluconate (MEDLINE KIT) (PERIDEX) 0.12 % solution 15 mL  15 mL Mouth Rinse BID Hammonds, Sharyn Blitz, MD   15 mL at 06/02/17 0747  . chlorhexidine gluconate  (MEDLINE KIT) (PERIDEX) 0.12 % solution 15 mL  15 mL Mouth Rinse BID Hammonds, Sharyn Blitz, MD   15 mL at 06/01/17 2008  . docusate (COLACE) 50 MG/5ML liquid 100 mg  100 mg Per Tube BID Hammonds, Sharyn Blitz, MD   100 mg at 06/01/17 2234  . fentaNYL (SUBLIMAZE) injection 50 mcg  50 mcg Intravenous Q15 min PRN Hammonds, Sharyn Blitz, MD   50 mcg at 05/29/17 2154  . fentaNYL (SUBLIMAZE) injection 50 mcg  50 mcg Intravenous Q1H PRN Anders Simmonds, MD   50 mcg at 06/02/17 0315  . heparin injection 5,000 Units  5,000 Units Subcutaneous Q8H Hammonds, Sharyn Blitz, MD   5,000 Units at 06/02/17 9295  . hydrALAZINE (APRESOLINE) injection 5 mg  5 mg Intravenous Q4H PRN Nicholes Stairs, MD      . HYDROcodone-acetaminophen (NORCO/VICODIN) 5-325 MG per tablet 1-2 tablet  1-2 tablet Oral Q4H PRN Nicholes Stairs, MD   2 tablet at 06/02/17 0754  . lactated ringers infusion   Intravenous Continuous Hammonds, Sharyn Blitz, MD 75 mL/hr at 06/01/17 1930    . midazolam (VERSED) injection 1 mg  1 mg Intravenous Q15 min PRN Hammonds, Sharyn Blitz, MD      . midazolam (VERSED) injection 1 mg  1 mg Intravenous Q2H PRN Hammonds, Sharyn Blitz, MD      . ondansetron [facility]) injection 4 mg  4 mg Intravenous Q6H PRN Hammonds, Sharyn Blitz, MD      . pantoprazole (PROTONIX) injection 40 mg  40 mg Intravenous QHS Hammonds, Sharyn Blitz, MD   40 mg at 06/01/17 2234  . polyethylene glycol (MIRALAX / GLYCOLAX) packet 17 g  17 g Per Tube BID Cherene Altes, MD   17 g at 06/01/17 2234     Discharge Medications: Please see discharge summary for a list of discharge medications.  Relevant Imaging Results:  Relevant Lab Results:   Additional Information SS# [id number]  Benard Halsted, LCSWA

## 2017-06-02 NOTE — Discharge Instructions (Signed)
Ortho.  Discharge instruction:  -You are okay for full weightbearing to the right lower extremity. -Maintain your postoperative bandages until your follow-up appointment with Dr. Stann Mainland. -It is okay to shower with your bandages in place, however do not submerge underwater. -For the prevention of blood clots take an 81 mg aspirin twice daily for 6 weeks. Return to see Dr. Stann Mainland in 2 weeks for wound check and suture removal.-

## 2017-06-02 NOTE — Progress Notes (Signed)
   Subjective: 4 Days Post-Op Procedure(s) (LRB): INTRAMEDULLARY (IM) NAIL INTERTROCHANTRIC FEMUR (Right)  Pt resting comfortably in no acute distress Remains on trach  No new issues regarding the right hip Patient reports pain as mild.  She has been up with therapy.  Objective:   VITALS:   Vitals:   06/02/17 1200 06/02/17 1400  BP:  (!) 134/96  Pulse:    Resp:    Temp: (!) 97.5 F (36.4 C)   SpO2:      Right hip incision healing well Dressing in place No signs of erythema or drainage  LABS Recent Labs    05/31/17 0313 06/02/17 0308  HGB 9.5* 9.1*  HCT 30.0* 29.6*  WBC 8.5 5.8  PLT 211 212    Recent Labs    05/31/17 0313 06/02/17 0308  NA 139 136  K 4.4 3.8  BUN 20 10  CREATININE 0.86 0.68  GLUCOSE 71 92     Assessment/Plan: 4 Days Post-Op Procedure(s) (LRB): INTRAMEDULLARY (IM) NAIL INTERTROCHANTRIC FEMUR (Right) Right hip stable at this point Continue current treatment PRN pain meds SCDs and heparin for DVT ppx, would recommend dc on bid 81 mg asa x 6 weeks WBAT RLE Maintain post op bandage until follow up Follow up with Stann Mainland in 2 weeks     [facility] is now [facility]  [place] [address] [place], [place], [place] [address] Phone: [phone] [email or web address]  [employer]

## 2017-06-02 NOTE — Progress Notes (Signed)
CSW checked in with patient again regarding SNF facilities. She still appears uncertain about going, but CSW explained it would be for short term and left her a list of facilities.   Percell Locus Verley Pariseau LCSW [phone]

## 2017-06-02 NOTE — Progress Notes (Signed)
  Speech Language Pathology Treatment: Cognitive-Linquistic  Patient Details Name: Katelyn [name] MRN: [id number] DOB: 1950-09-20 Today's Date: 06/02/2017 Time: 2800-3491 SLP Time Calculation (min) (ACUTE ONLY): 15 min  Assessment / Plan / Recommendation Clinical Impression  Pt was seen for skilled ST targeting functional communication.  Pt continues to have #6 cuffed trach (deflated upon therapist's arrival) with poor toleration of speaking valve and finger occlusion trials as evidenced by inability to redirect air through the upper airway and achieve voicing.  Valve was placed for at most 2-3 breath cycles before pt forcefully coughed it off.  Pt attempted to communicate by mouthing words; however, her rate of speech is too rapid for effective communication in this manner.  SLP provided education about mouthing words slowly to make it easier for listeners to discern what she is saying; however, pt's self regulation and carryover limited her ability to put this strategy into practice.  Writing and using a generic communication board were also ineffective for improving functional communication.  Pt made perseverative errors when writing her own name and could not locate pictures on communication boards to effectively convey her needs to therapist.  Will continue efforts to maximize functional communication.  Pt declining POs on this date.    HPI HPI: 39yoF with hx CVA, DVT (2016), HTN, Anemia, Gallstone pancreatitis, and Epiglottis mass, who presented with a hip fracture, now s/p operative repair. Intubated for procedure; postop patient developed respiratory distress and required emergent trach.       SLP Plan  Continue with current plan of care       Recommendations         Patient may use Passy-Muir Speech Valve: with SLP only PMSV Supervision: Full MD: Please consider changing trach tube to : Smaller size;Cuffless         Plan: Continue with current plan of care       GO                 PageSelinda Orion 06/02/2017, 2:48 PM

## 2017-06-03 ENCOUNTER — Encounter (HOSPITAL_COMMUNITY)
Admission: EM | Disposition: A | Discharge status: Skilled Nursing Facility | Payer: Self-pay | Source: Home / Self Care | Attending: Internal Medicine

## 2017-06-03 ENCOUNTER — Encounter (HOSPITAL_COMMUNITY): Payer: Self-pay

## 2017-06-03 DIAGNOSIS — R1312 Dysphagia, oropharyngeal phase: Secondary | ICD-10-CM

## 2017-06-03 DIAGNOSIS — D649 Anemia, unspecified: Secondary | ICD-10-CM

## 2017-06-03 SURGERY — LARYNGOSCOPY, DIRECT
Anesthesia: General

## 2017-06-03 MED ORDER — CHLORHEXIDINE GLUCONATE 0.12 % MT SOLN
OROMUCOSAL | Status: AC
  Administered 2017-06-03: 15 mL via OROMUCOSAL
  Filled 2017-06-03: qty 15

## 2017-06-03 NOTE — Progress Notes (Signed)
  Speech Language Pathology Treatment: Dysphagia;Cognitive-Linquistic  Patient Details Name: Katelyn [name] MRN: [id number] DOB: [birth year] Today's Date: 06/03/2017 Time: 0964-3838 SLP Time Calculation (min) (ACUTE ONLY): 15 min  Assessment / Plan / Recommendation Clinical Impression  Pt observed with peaches and sips of coffee with no deficits observed given cautious intake and appropriate basic aspiration precautions followed. No further interventions for dysphagia needed at this time, however will continue following with orders given potential for changes in function with upcoming procedures.  Treatment focused on use of letter board for alternative communication. Pts handwriting is illegible, which is new per family. Pt able to point to letters on board on command, and to spell single words. Expect pt will not use board without cueing, but discussed use with daughter.  HPI HPI: 42yoF with hx CVA, DVT (2016), HTN, Anemia, Gallstone pancreatitis, and Epiglottis mass, who presented with a hip fracture, now s/p operative repair. Intubated for procedure; postop patient developed respiratory distress and required emergent trach.       SLP Plan  Continue with current plan of care       Recommendations  Diet recommendations: Dysphagia 3 (mechanical soft);Thin liquid                Oral Care Recommendations: Oral care QID Follow up Recommendations: Outpatient SLP SLP Visit Diagnosis: Dysphagia, oropharyngeal phase (R13.12) Plan: Continue with current plan of care       GO               [facility], [place] CCC-SLP [phone]  Lynann Beaver 06/03/2017, 11:33 AM

## 2017-06-03 NOTE — Progress Notes (Signed)
PULMONARY / CRITICAL CARE MEDICINE   Name: Katelyn [name] MRN: [id number] DOB: [birth year]    ADMISSION DATE:  05/29/2017 CONSULTATION DATE:  05/29/2017   REFERRING MD:  Dr. Stann Mainland   CHIEF COMPLAINT:  S/P Tracheostomy   HISTORY OF PRESENT ILLNESS:   67 year old female with PMH of CVA, DVT, HTN, Epiglottic Lesion. Baseline uses pampers, requires assistance feeding, with chronic right-sided weakness secondary to CVA, non-compliant with walker     Presents to ED on 4/5 with right hip pain s/p fall. Take to OR for Intramedullary Nail Intertrochanteric Femur. Underwent Spinal Block for procedure. Was given 25 mcg fentanyl, bupivacaine, and 10 mg propofol. Tolerated procedure well and taken to PACU. In PACU patient was noted to be obtunded and hypoxic with saturation 70-80 despite supplemental supplemental oxygen and attempted bagging. Taken to OR for intubated due to epiglottic mass. Unable to place ETT so Tracheostomy was placed  4/7 The patient is in trach collar and appears quite comfortable. She pulled out her NGT yesterday. We are waiting for IR to place a clortrack tomorrow. . According to her family thepatienthad been having trouble swallowing for a while and losing weight at home.  4/8 Her urine output was scant last night so she was administered a bolus. Will start low dose iV fluids until patient has placement of feeding tube. She is awake , a;lert and cooperative. Can likely leave the ICU environment.  4/9 Patient looks well. She was okayed for mechanical soft diet. I have asked ENT to formally see the patient. In all likelihood her condition can be downgraded today.  4/10 The patient is doing well. She is eating without difficulty. Has been off the ventilator She is scheduled  For biopsy of her epiglottic lesion today. She can be tranasferred to the regualr floor.. She is awake, oriented and sitting comfortably in a chair.  Marland Kitchen    PAST MEDICAL HISTORY :  She  has a past  medical history of Acute gallstone pancreatitis (01/08/2014), AKI (acute kidney injury) ([place]) (01/08/2014), Anemia, Blood transfusion, Cholecystitis, Hypercholesteremia, Hypertension, Right leg DVT ([place]) (05/08/2014), Septic shock - Kelbsiella (01/09/2014), Shortness of breath, and Stroke ([place]).  PAST SURGICAL HISTORY: She  has a past surgical history that includes Abdominal hysterectomy; TEE without cardioversion (03/06/2011); Endoscopic retrograde cholangiopancreatography (ercp) with propofol (N/A, 01/11/2014); ERCP; ERCP (N/A, 05/26/2014); EUS (N/A, 12/07/2014); Esophagogastroduodenoscopy (egd) with propofol (N/A, 12/07/2014); Gastrointestinal stent removal (N/A, 12/07/2014); Percutaneous tracheostomy (N/A, 05/29/2017); and Intramedullary (im) nail intertrochanteric (Right, 05/29/2017).  No Known Allergies  No current facility-administered medications on file prior to encounter.    Current Outpatient Medications on File Prior to Encounter  Medication Sig  . acetaminophen (TYLENOL) 325 MG tablet Take by mouth as needed for mild pain or headache.  . carvedilol (COREG) 12.5 MG tablet Take 1 tablet (12.5 mg total) by mouth 2 (two) times daily with a meal. (Patient not taking: Reported on 05/29/2017)  . furosemide (LASIX) 20 MG tablet Take 1 tablet (20 mg total) by mouth daily. (Patient not taking: Reported on 05/29/2017)  . potassium chloride 20 MEQ TBCR Take 20 mEq by mouth daily. (Patient not taking: Reported on 05/29/2017)       VITAL SIGNS: BP (!) 156/90   Pulse 77   Temp (!) 97.5 F (36.4 C) (Axillary)   Resp 10   Ht 4\' 7"  (1.397 m)   Wt 97 lb 10.6 oz (44.3 kg)   SpO2 100%   BMI 22.70 kg/m   PHYSICAL EXAMINATION: General:  Thin,Elderly female, no distress. Appears comfortable Neuro:  alert, follows commands  HEENT:  Trach in place, n  Cardiovascular:  RRR, no MRG  Lungs:  Coarse rhonchi anteriorly, minimal secretions   Abdomen:  non-distended, non-tender   Skin:  Warm, dry, intact  Extr:  no edema  LABS:  BMET Recent Labs  Lab 05/30/17 0341 05/31/17 0313 06/02/17 0308  NA 137 139 136  K 3.5 4.4 3.8  CL 98* 102 102  CO2 25 25 27   BUN 23* 20 10  CREATININE 0.94 0.86 0.68  GLUCOSE 98 71 92    Electrolytes Recent Labs  Lab 05/29/17 2100 05/30/17 0341 05/31/17 0313 06/02/17 0308  CALCIUM 9.0 8.7* 8.9 8.8*  MG 1.9 1.8  --   --   PHOS 5.0* 2.2*  --   --     CBC Recent Labs  Lab 05/30/17 0341 05/31/17 0313 06/02/17 0308  WBC 12.1* 8.5 5.8  HGB 6.3* 9.5* 9.1*  HCT 20.8* 30.0* 29.6*  PLT 226 211 212    Coag's Recent Labs  Lab 05/29/17 1526 05/29/17 2100  INR 1.14 1.15    Sepsis Markers No results for input(s): LATICACIDVEN, PROCALCITON, O2SATVEN in the last 168 hours.  ABG Recent Labs  Lab 05/29/17 2145 05/29/17 2350 05/30/17 0447  PHART 7.294* 7.416 7.496*  PCO2ART 61.8* 47.0 35.7  PO2ART 54.0* 265.0* 126.0*    Liver Enzymes Recent Labs  Lab 05/29/17 1143 05/29/17 2100  AST 19 30  ALT 13* 14  ALKPHOS 72 66  BILITOT 0.8 0.5  ALBUMIN 2.6* 2.3*    Cardiac Enzymes No results for input(s): TROPONINI, PROBNP in the last 168 hours.  Glucose Recent Labs  Lab 05/30/17 0404 05/30/17 0714  GLUCAP 88 94    Imaging Ct Soft Tissue Neck W Contrast  Result Date: 06/02/2017 CLINICAL DATA:  Epiglottic mass EXAM: CT NECK WITH CONTRAST TECHNIQUE: Multidetector CT imaging of the neck was performed using the standard protocol following the bolus administration of intravenous contrast. CONTRAST:  40mL ISOVUE-300 IOPAMIDOL (ISOVUE-300) INJECTION 61% COMPARISON:  None. FINDINGS: PHARYNX AND LARYNX: --Nasopharynx: Fossae of Rosenmuller are clear. Normal adenoid tonsils for age. --Oral cavity and oropharynx: Normal palatine tonsils. The tongue and oral cavity are normal. --Hypopharynx: There is filling of the vallecula and effacement of the piriform sinuses. --Larynx: There is a uniformly hyperdense mass anterior to the epiglottis that measures  approximately 3.4 x 2.1 x 3.8 cm. --Retropharyngeal space: No abscess, effusion or lymphadenopathy. SALIVARY GLANDS: --Parotid: No mass lesion or inflammation. No sialolithiasis or ductal dilatation. --Submandibular: Symmetric without inflammation. No sialolithiasis or ductal dilatation. --Sublingual: Normal. No ranula or other visible lesion of the base of tongue and floor of mouth. THYROID: Normal. LYMPH NODES: No enlarged or abnormal density lymph nodes. VASCULAR: Major cervical vessels are patent. LIMITED INTRACRANIAL: Normal. VISUALIZED ORBITS: Normal. MASTOIDS AND VISUALIZED PARANASAL SINUSES: No fluid levels or advanced mucosal thickening. No mastoid effusion. SKELETON: No bony spinal canal stenosis. No lytic or blastic lesions. UPPER CHEST: Bilateral pleural effusions. Tracheostomy tube tip in the upper trachea. OTHER: None. IMPRESSION: 1. Uniformly hypoattenuating mass filling the vallecula and effacing the piriform sinuses. Vascular malformation or hemangioma are primary considerations. Lingual thyroid tissue could also cause this appearance, but is less likely given the normal thyroid present in this patient and the differences in attenuation between the mass and the thyroid gland. Hemorrhagic neoplasm is a more likely possibility. 2. No cervical lymphadenopathy. 3. Pleural effusions are better assessed on the concomitant chest CT. Satisfactory  position of tracheostomy tube. Electronically Signed   By: Ulyses Jarred M.D.   On: 06/02/2017 17:17     STUDIES:  XRay Right Hip 4/5 > Intertrochanteric femur fracture on the right with varus angulation at the fracture site. No other fracture. No dislocation. Mild symmetric narrowing of each hip joint. CXR 4/5 > No edema or consolidation. Heart size and pulmonary vascularity are normal. No adenopathy. There is aortic atherosclerosis. No pneumothorax. No bone lesions CT Hip 4/5 > Acute right intertrochanteric fracture as described above. No other acute  abnormality   DISCUSSION: 67 year old female presents s/p Fall with right hip fracture. Taken to OR. In PACU patient went into respiratory distress requiring tracheostomy placement in OR.   ASSESSMENT / PLAN:  Acute Hypoxic Respiratory Distress secondary to subglottic mass s/p tracheostomy  Patient doing well on trach collar. Can't vocalize secondary to the large epiglottic mass  Diastolic Heart Failure (EF 55, G1DD)  H/O HTN  P:  Cardiac Monitoring  Hold Coreg/Lasix    CKD   Renal fn and output ok at this time.  Anemia of Chronic Disease  HB 9.1 her Hb was only 8.4 on presentation. 3 years ago it was close to 11. Iron studies suggest chronic disease   Right Hip Fracture s/p IM Nail secondary to Fall  Plan: Per Ortho   Impression The patient is doing well. Had her hip repaired. Await input form ENT. Will need biopsy and definitive plan for the epiglottic mass. The patient was approved for a dysphagia diet.  Will be transferred today.We will sign off to the hospitalist group.

## 2017-06-03 NOTE — Progress Notes (Signed)
Occupational Therapy Treatment Patient Details Name: Katelyn [name] MRN: [id number] DOB: Dec 07, 1950 Today's Date: 06/03/2017    History of present illness This 67 y.o. female admitted after falling asleep at her table and falling out of the chair.  She sustained Rt femur fx and underwent Im nail.  Post operatively, she developed respiratory distress and required an emergent trach due to subglottic mass.  PMH includes:  Diastolic HF, CKD stage 3, h/o CVA with Rt sided weakness    OT comments  Pt progressing towards established OT goals. Pt performing stand pivot transfer to [facility] with Min A +2 and RW. Pt requiring Max cues and increased time due to poor safety awareness and following of commands. During toilet hygiene, pt requiring Max A +2 requiring one person for balance and second for toilet hygiene. Continue to recommend dc to SNF and will continue to follow acutely as admitted.   Follow Up Recommendations  SNF    Equipment Recommendations  None recommended by OT    Recommendations for Other Services      Precautions / Restrictions Precautions Precautions: Fall Precaution Comments: watch O2 saturations patient on ATC/trach Restrictions Weight Bearing Restrictions: No RLE Weight Bearing: Weight bearing as tolerated       Mobility Bed Mobility Overal bed mobility: Needs Assistance Bed Mobility: Supine to Sit;Sit to Supine     Supine to sit: Mod assist;+2 for safety/equipment;HOB elevated     General bed mobility comments: Mod A to bring BLEs to EOB and then elevate trunk  Transfers Overall transfer level: Needs assistance Equipment used: Rolling walker (2 wheeled) Transfers: Sit to/from Stand Sit to Stand: Min assist;+2 safety/equipment Stand pivot transfers: +2 physical assistance;Min assist       General transfer comment: Min A for sit<>Stand demonstrating increased tolerance to power into standing. Requiring Min A +2 for safety during stand pivot to [facility]     Balance Overall balance assessment: Needs assistance;History of Falls Sitting-balance support: Feet supported;Bilateral upper extremity supported Sitting balance-Leahy Scale: Poor Sitting balance - Comments: requires UE support    Standing balance support: During functional activity;Bilateral upper extremity supported Standing balance-Leahy Scale: Poor                             ADL either performed or assessed with clinical judgement   ADL Overall ADL's : Needs assistance/impaired                         Toilet Transfer: Minimal assistance;+2 for physical assistance;Stand-pivot;BSC;RW Toilet Transfer Details (indicate cue type and reason): Pt stand pivot to [facility] with Min A +2. Pt with poor awarness and attempting to sit prematurely.  Toileting- Clothing Manipulation and Hygiene: Maximal assistance;Sit to/from stand;+2 for physical assistance Toileting - Clothing Manipulation Details (indicate cue type and reason): Max A +2 for toilet hygiene. Pt declining to perform her own toilet hygiene and attempting to sit down despite Max cues. Pt requiring Max A to maintain standing and then second perform to complete toilet hygiene.     Functional mobility during ADLs: Minimal assistance;+2 for physical assistance;Rolling walker;Cueing for sequencing;Cueing for safety General ADL Comments: Pt with decreased following of commands and poor safety awareness.     Vision       Perception     Praxis      Cognition Arousal/Alertness: Awake/alert Behavior During Therapy: Flat affect Overall Cognitive Status: Impaired/Different from baseline Area of Impairment: Following commands;Safety/judgement;Problem  solving;Awareness;Attention                   Current Attention Level: Sustained   Following Commands: Follows one step commands inconsistently;Follows one step commands with increased time Safety/Judgement: Decreased awareness of safety;Decreased awareness of  deficits Awareness: Emergent Problem Solving: Slow processing;Decreased initiation;Difficulty sequencing;Requires verbal cues;Requires tactile cues General Comments: Pt with poor following of commands and reqiuiring Max cues for safety and seqencing dueint toileting. Pt presenting with self limiting behavior. Difficult to assess completely with communication defciits        Exercises     Shoulder Instructions       General Comments Daughter present at end of session    Pertinent Vitals/ Pain       Pain Assessment: Faces Faces Pain Scale: Hurts little more Pain Location: right hip Pain Descriptors / Indicators: Aching Pain Intervention(s): Monitored during session;Repositioned;Limited activity within patient's tolerance;Patient requesting pain meds-RN notified  Home Living                                          Prior Functioning/Environment              Frequency  Min 2X/week        Progress Toward Goals  OT Goals(current goals can now be found in the care plan section)  Progress towards OT goals: Progressing toward goals  Acute Rehab OT Goals Patient Stated Goal: to go home  OT Goal Formulation: With patient Time For Goal Achievement: 06/15/17 Potential to Achieve Goals: Good ADL Goals Pt Will Perform Grooming: with min assist;standing Pt Will Perform Upper Body Bathing: with set-up;sitting Pt Will Perform Lower Body Bathing: sit to/from stand;with adaptive equipment;with mod assist Pt Will Perform Upper Body Dressing: with min assist;sitting Pt Will Perform Lower Body Dressing: with mod assist;sit to/from stand;with adaptive equipment Pt Will Transfer to Toilet: with min assist;ambulating;regular height toilet;bedside commode;grab bars Pt Will Perform Toileting - Clothing Manipulation and hygiene: with min assist;sit to/from stand  Plan Discharge plan remains appropriate    Co-evaluation                 AM-PAC PT "6 Clicks"  Daily Activity     Outcome Measure   Help from another person eating meals?: A Little Help from another person taking care of personal grooming?: A Little Help from another person toileting, which includes using toliet, bedpan, or urinal?: A Lot Help from another person bathing (including washing, rinsing, drying)?: A Lot Help from another person to put on and taking off regular upper body clothing?: A Lot Help from another person to put on and taking off regular lower body clothing?: Total 6 Click Score: 13    End of Session Equipment Utilized During Treatment: Oxygen(5L)  OT Visit Diagnosis: Unsteadiness on feet (R26.81);Pain Pain - Right/Left: Right Pain - part of body: Hip   Activity Tolerance Patient limited by fatigue   Patient Left with call bell/phone within reach;in chair;with family/visitor present;with nursing/sitter in room   Nurse Communication Mobility status        Time: 9326-7124 OT Time Calculation (min): 29 min  Charges: OT Treatments $Self Care/Home Management : 23-37 mins  [place], OTR/L Acute Rehab Pager: [phone] Office: [place] 06/03/2017, 11:58 AM

## 2017-06-03 NOTE — Care Management Note (Signed)
Case Management Note  Patient Details  Name: Jendaya Gossett MRN: [id number] Date of Birth: 1950/10/18  Subjective/Objective:   67 y.o. female admitted after falling asleep at her table and falling out of the chair.  She sustained Rt femur fx and underwent Im nail.  Post operatively, she developed respiratory distress and required an emergent trach due to subglottic mass.   PTA, pt resided at home, and required assistance with ADLS.  Action/Plan: PT/OT recommending SNF at dc; CSW following to facilitate discharge to SNF upon medical stability.  Will follow.   Expected Discharge Date:                  Expected Discharge Plan:  Skilled Nursing Facility  In-House Referral:  Clinical Social Work  Discharge planning Services  CM Consult  Post Acute Care Choice:    Choice offered to:     DME Arranged:    DME Agency:     HH Arranged:    [facility] Agency:     Status of Service:  In process, will continue to follow  If discussed at Long Length of Stay Meetings, dates discussed:    Additional Comments:  Ella Bodo, RN 06/03/2017, 4:02 PM

## 2017-06-04 LAB — RAPID URINE DRUG SCREEN, HOSP PERFORMED
AMPHETAMINES: NOT DETECTED
BENZODIAZEPINES: NOT DETECTED
Barbiturates: NOT DETECTED
COCAINE: NOT DETECTED
OPIATES: POSITIVE — AB
Tetrahydrocannabinol: NOT DETECTED

## 2017-06-04 LAB — URINALYSIS, ROUTINE W REFLEX MICROSCOPIC
Bacteria, UA: NONE SEEN
GLUCOSE, UA: NEGATIVE mg/dL
Ketones, ur: NEGATIVE mg/dL
NITRITE: NEGATIVE
PH: 5 (ref 5.0–8.0)
Protein, ur: NEGATIVE mg/dL
SPECIFIC GRAVITY, URINE: 1.028 (ref 1.005–1.030)

## 2017-06-04 MED ORDER — FUROSEMIDE 10 MG/ML IJ SOLN
40.0000 mg | Freq: Every day | INTRAMUSCULAR | Status: DC
  Administered 2017-06-05: 40 mg via INTRAVENOUS
  Filled 2017-06-04: qty 4

## 2017-06-04 MED ORDER — FUROSEMIDE 10 MG/ML IJ SOLN
80.0000 mg | Freq: Once | INTRAMUSCULAR | Status: AC
  Administered 2017-06-04: 80 mg via INTRAVENOUS
  Filled 2017-06-04: qty 8

## 2017-06-04 NOTE — Care Management Important Message (Signed)
Important Message  Patient Details  Name: Katelyn [name] MRN: [id number] Date of Birth: [birth year]   Medicare Important Message Given:  No Patient at end of life out of respect IM not given    Orbie Pyo 06/04/2017, 1:04 PM

## 2017-06-04 NOTE — Progress Notes (Signed)
Patient ID: Katelyn [name], female   DOB: [birth year], 67 y.o.   MRN: [id number]  PROGRESS NOTE    Katelyn [name]  TSV:[id number] DOB: Jul 14, 1950 DOA: 05/29/2017 PCP: Aretta Nip, MD   Brief Narrative:  67 year old female with history of stroke with residual right-sided weakness, DVT not on anticoagulation, hypertension, gallstone pancolitis, tobacco use, epiglottis lesion present on on 05/29/2017 with right hip pain after a fall.  She was found to have right hip fracture.  Orthopedics was consulted.  Patient underwent surgical intervention by orthopedics.  Postprocedure, patient was obtunded and hypoxic and patient ended up having a tracheostomy because of epiglottic mass.  She was transferred to ICU for further care.  ENT was consulted for the epiglottic mass.  Her condition has stabilized and she was transferred to [facility] care from 06/04/2017.  Assessment & Plan:   Principal Problem:   Closed displaced intertrochanteric fracture of right femur ([place]) Active Problems:   Hypercholesteremia   Essential hypertension   Ataxia following cerebral infarction   CKD (chronic kidney disease) stage 3, GFR 30-59 ml/min (HCC)   Diastolic dysfunction   Tobacco abuse   Anemia   Lesion of epiglottis   Dysphagia   Weight loss   Difficult airway for intubation   Pressure injury of skin   Malnutrition of moderate degree   Acute hypoxic respite failure secondary to epiglottic mass status post tracheostomy -Off ventilator.  Now with trach collar.  Follow further recommendations from pulmonary -Continue 5 L oxygen -We will get chest x-ray for tomorrow -DC IV fluids  Right hip fracture status post fall -Status post surgical intervention by orthopedics on 05/29/2017 -Wound care as per orthopedics recommendations. -Fall precautions -PT/OT evaluation  Chronic diastolic heart failure -Currently compensated.  Lasix and Coreg INR hold.  -Strict input and output.  Daily weights.  Positive balance of  8270.8 mL since admission.  Unclear if this is accurate.  Will start intravenous Lasix and transition to oral Lasix soon  Leukocytosis -Probably reactive.  Resolved.  Repeat a.m. Labs  Epiglottic mass -ENT following.  Will follow up with ENT regarding timing of biopsy, might happen as an outpatient  Chronic kidney disease stage III -Creatinine stable.  Monitor  Anemia -Probably secondary to anemia of chronic disease.  Hemoglobin stable.  History of stroke and DVT -Patient not on anticoagulation.  Outpatient follow-up with primary care provider  Tobacco abuse -We will continue to have cessation counseling.  Noncompliance -Patient will have to be more compliant  Hypertension -Blood pressure stable.  Noncompliant to meds as an outpatient.  Will resume Lasix.  Generalized deconditioning -PT/OT eval.  Will need probable nursing home placement   DVT prophylaxis: Heparin Code Status: Full  family Communication: None at bedside Disposition Plan: Nursing home in 1-2 days  Consultants: PCCM/General surgery/ENT  Procedures:  Tracheostomy on 05/29/2017 Right hip surgery with intramedullary implant on 05/29/2017  Antimicrobials:  None   Subjective: Patient seen and examined at bedside.  She is awake and comfortable.  Cannot speak because of tracheostomy no overnight fever or vomiting reported.  Objective: Vitals:   06/04/17 0354 06/04/17 0434 06/04/17 0855 06/04/17 1233  BP:  123/67    Pulse: 60 61 63 84  Resp: '16  16 17  ' Temp:  98.5 F (36.9 C)    TempSrc:      SpO2: 98% 99% 99% 99%  Weight:      Height:        Intake/Output Summary (Last 24 hours) at 06/04/2017 1259 Last  data filed at 06/04/2017 0815 Gross per 24 hour  Intake 0 ml  Output 325 ml  Net -325 ml   Filed Weights   06/01/17 0500 06/02/17 0600 06/03/17 0400  Weight: 42.2 kg (93 lb 0.6 oz) 42.2 kg (93 lb 0.6 oz) 44.3 kg (97 lb 10.6 oz)    Examination:  General exam: Looks older than stated age.   Thinly built.  Appears calm and comfortable  Respiratory system: Bilateral decreased breath sound at bases with scattered crackles Cardiovascular system: S1 & S2 heard, rate controlled  gastrointestinal system: Abdomen is nondistended, soft and nontender. Normal bowel sounds heard. Central nervous system: Awake. No focal neurological deficits. Moving extremities Extremities: No cyanosis, clubbing, edema  Skin: No rashes, lesions or ulcers; tracheostomy present Lymph: No cervical lymphadenopathy    Data Reviewed: I have personally reviewed following labs and imaging studies  CBC: Recent Labs  Lab 05/29/17 1143 05/29/17 2100 05/30/17 0341 05/31/17 0313 06/02/17 0308  WBC 11.7* 17.5* 12.1* 8.5 5.8  NEUTROABS 9.7* 16.2*  --   --  3.7  HGB 8.4* 7.2* 6.3* 9.5* 9.1*  HCT 28.9* 25.4* 20.8* 30.0* 29.6*  MCV 83.0 84.9 83.9 81.7 85.1  PLT 322 273 226 211 621   Basic Metabolic Panel: Recent Labs  Lab 05/29/17 1143 05/29/17 2100 05/30/17 0341 05/31/17 0313 06/02/17 0308  NA 138 139 137 139 136  K 3.7 4.2 3.5 4.4 3.8  CL 98* 99* 98* 102 102  CO2 '28 28 25 25 27  ' GLUCOSE 105* 145* 98 71 92  BUN 19 20 23* 20 10  CREATININE 0.80 0.92 0.94 0.86 0.68  CALCIUM 9.6 9.0 8.7* 8.9 8.8*  MG  --  1.9 1.8  --   --   PHOS  --  5.0* 2.2*  --   --    GFR: Estimated Creatinine Clearance: 41 mL/min (by C-G formula based on SCr of 0.68 mg/dL). Liver Function Tests: Recent Labs  Lab 05/29/17 1143 05/29/17 2100  AST 19 30  ALT 13* 14  ALKPHOS 72 66  BILITOT 0.8 0.5  PROT 7.9 7.1  ALBUMIN 2.6* 2.3*   No results for input(s): LIPASE, AMYLASE in the last 168 hours. No results for input(s): AMMONIA in the last 168 hours. Coagulation Profile: Recent Labs  Lab 05/29/17 1526 05/29/17 2100  INR 1.14 1.15   Cardiac Enzymes: No results for input(s): CKTOTAL, CKMB, CKMBINDEX, TROPONINI in the last 168 hours. BNP (last 3 results) No results for input(s): PROBNP in the last 8760  hours. HbA1C: No results for input(s): HGBA1C in the last 72 hours. CBG: Recent Labs  Lab 05/30/17 0404 05/30/17 0714  GLUCAP 88 94   Lipid Profile: No results for input(s): CHOL, HDL, LDLCALC, TRIG, CHOLHDL, LDLDIRECT in the last 72 hours. Thyroid Function Tests: No results for input(s): TSH, T4TOTAL, FREET4, T3FREE, THYROIDAB in the last 72 hours. Anemia Panel: No results for input(s): VITAMINB12, FOLATE, FERRITIN, TIBC, IRON, RETICCTPCT in the last 72 hours. Sepsis Labs: No results for input(s): PROCALCITON, LATICACIDVEN in the last 168 hours.  Recent Results (from the past 240 hour(s))  MRSA PCR Screening     Status: None   Collection Time: 05/29/17  9:02 PM  Result Value Ref Range Status   MRSA by PCR NEGATIVE NEGATIVE Final    Comment:        The GeneXpert MRSA Assay (FDA approved for NASAL specimens only), is one component of a comprehensive MRSA colonization surveillance program. It is not intended to diagnose  MRSA infection nor to guide or monitor treatment for MRSA infections. Performed at [facility], [place] [address], [place],  [address]          Radiology Studies: Ct Soft Tissue Neck W Contrast  Result Date: 06/02/2017 CLINICAL DATA:  Epiglottic mass EXAM: CT NECK WITH CONTRAST TECHNIQUE: Multidetector CT imaging of the neck was performed using the standard protocol following the bolus administration of intravenous contrast. CONTRAST:  36m ISOVUE-300 IOPAMIDOL (ISOVUE-300) INJECTION 61% COMPARISON:  None. FINDINGS: PHARYNX AND LARYNX: --Nasopharynx: Fossae of Rosenmuller are clear. Normal adenoid tonsils for age. --Oral cavity and oropharynx: Normal palatine tonsils. The tongue and oral cavity are normal. --Hypopharynx: There is filling of the vallecula and effacement of the piriform sinuses. --Larynx: There is a uniformly hyperdense mass anterior to the epiglottis that measures approximately 3.4 x 2.1 x 3.8 cm. --Retropharyngeal space: No  abscess, effusion or lymphadenopathy. SALIVARY GLANDS: --Parotid: No mass lesion or inflammation. No sialolithiasis or ductal dilatation. --Submandibular: Symmetric without inflammation. No sialolithiasis or ductal dilatation. --Sublingual: Normal. No ranula or other visible lesion of the base of tongue and floor of mouth. THYROID: Normal. LYMPH NODES: No enlarged or abnormal density lymph nodes. VASCULAR: Major cervical vessels are patent. LIMITED INTRACRANIAL: Normal. VISUALIZED ORBITS: Normal. MASTOIDS AND VISUALIZED PARANASAL SINUSES: No fluid levels or advanced mucosal thickening. No mastoid effusion. SKELETON: No bony spinal canal stenosis. No lytic or blastic lesions. UPPER CHEST: Bilateral pleural effusions. Tracheostomy tube tip in the upper trachea. OTHER: None. IMPRESSION: 1. Uniformly hypoattenuating mass filling the vallecula and effacing the piriform sinuses. Vascular malformation or hemangioma are primary considerations. Lingual thyroid tissue could also cause this appearance, but is less likely given the normal thyroid present in this patient and the differences in attenuation between the mass and the thyroid gland. Hemorrhagic neoplasm is a more likely possibility. 2. No cervical lymphadenopathy. 3. Pleural effusions are better assessed on the concomitant chest CT. Satisfactory position of tracheostomy tube. Electronically Signed   By: KUlyses JarredM.D.   On: 06/02/2017 17:17   Ct Chest W Contrast  Result Date: 06/03/2017 CLINICAL DATA:  Epiglottic mass. Shortness of breath. History of recent fall with right femur fracture. EXAM: CT CHEST WITH CONTRAST TECHNIQUE: Multidetector CT imaging of the chest was performed during intravenous contrast administration. CONTRAST:  768mISOVUE-300 IOPAMIDOL (ISOVUE-300) INJECTION 61% COMPARISON:  Chest x-ray 05/29/2017 FINDINGS: Cardiovascular: The heart is normal in size. No pericardial effusion. There is mild tortuosity, ectasia and calcification of the  thoracic aorta but no focal aneurysm or dissection. Aortic valve calcifications are noted along with coronary artery calcifications. The pulmonary arteries are grossly normal. Mediastinum/Nodes: No mediastinal or hilar mass or lymphadenopathy. Small scattered lymph nodes are noted. The tracheostomy tube is in good position at the mid tracheal level. The esophagus is grossly normal. There is a small hiatal hernia and possible distal esophageal inflammation. Lungs/Pleura: Moderate breathing motion artifact. There are moderate-sized bilateral pleural effusions with overlying atelectasis. There are underlying emphysematous changes. No worrisome pulmonary lesions. No pulmonary nodules to suggest metastatic disease. Upper Abdomen: Heterogeneous appearance of the liver could be due to geographic fatty infiltration, intrinsic liver disease or vascular congestion. No adenopathy. Diffuse mesenteric edema and small volume ascites suspected. Musculoskeletal: Diffuse body wall edema. Very dense breast tissue but no obvious breast mass. No supraclavicular or axillary adenopathy. Small thyroid nodules are noted. The tracheostomy tube is in good position. The bony structures are unremarkable.  No worrisome bone lesions. IMPRESSION: 1. Tracheostomy tube  in good position at the mid tracheal level. 2. Moderate-sized bilateral pleural effusions with overlying atelectasis. 3. Emphysematous changes but no worrisome pulmonary lesions or pulmonary nodules to suggest metastatic disease. 4. Heterogeneous appearance of the liver, suspect intrinsic liver disease, geographic fatty infiltration or vascular congestion. Recommend correlation with liver function studies. 5. Diffuse body wall edema and mesenteric edema. Aortic Atherosclerosis (ICD10-I70.0) and Emphysema (ICD10-J43.9). Electronically Signed   By: Marijo Sanes M.D.   On: 06/03/2017 11:35        Scheduled Meds: . chlorhexidine gluconate (MEDLINE KIT)  15 mL Mouth Rinse BID  .  chlorhexidine gluconate (MEDLINE KIT)  15 mL Mouth Rinse BID  . docusate  100 mg Per Tube BID  . feeding supplement (ENSURE ENLIVE)  237 mL Oral BID BM  . heparin  5,000 Units Subcutaneous Q8H  . pantoprazole sodium  40 mg Per Tube QHS  . polyethylene glycol  17 g Per Tube BID   Continuous Infusions: . sodium chloride    . sodium chloride    . lactated ringers 75 mL/hr at 06/03/17 0700     LOS: 6 days        Aline August, MD [facility] Pager [phone]  If 7PM-7AM, please contact night-coverage [email or web address] Password [facility] 06/04/2017, 12:59 PM

## 2017-06-04 NOTE — Progress Notes (Signed)
Patient scheduled for q4hrs bladder scan. amount range from 128-400cc. The last reading showed ~340cc. Md notified. Order received for in&outx1. In and out cath done got out 325cc of dark amber urine. Day RN updated of findings. Will continue to monitor.

## 2017-06-04 NOTE — Care Management Important Message (Signed)
Important Message  Patient Details  Name: Katelyn [name] MRN: [id number] Date of Birth: 07-May-1950   Medicare Important Message Given:     Please disregard previou note IM was given  Orbie Pyo 06/04/2017, 1:13 PM

## 2017-06-04 NOTE — Social Work (Addendum)
CSW met with patient at bedside with sister. CSW provided list of SNF offers. CSW explained the SNF process/placment and insurance Auth needed for placement.  CSW will continue to follow for disposition.  Elissa Hefty, LCSW Clinical Social Worker [phone]

## 2017-06-05 ENCOUNTER — Inpatient Hospital Stay (HOSPITAL_COMMUNITY): Payer: Medicare HMO

## 2017-06-05 DIAGNOSIS — I351 Nonrheumatic aortic (valve) insufficiency: Secondary | ICD-10-CM

## 2017-06-05 LAB — CBC WITH DIFFERENTIAL/PLATELET
Basophils Absolute: 0 10*3/uL (ref 0.0–0.1)
Basophils Relative: 0 %
EOS ABS: 0.1 10*3/uL (ref 0.0–0.7)
Eosinophils Relative: 1 %
HCT: 32.8 % — ABNORMAL LOW (ref 36.0–46.0)
HEMOGLOBIN: 10.1 g/dL — AB (ref 12.0–15.0)
LYMPHS ABS: 2.4 10*3/uL (ref 0.7–4.0)
Lymphocytes Relative: 29 %
MCH: 26.3 pg (ref 26.0–34.0)
MCHC: 30.8 g/dL (ref 30.0–36.0)
MCV: 85.4 fL (ref 78.0–100.0)
Monocytes Absolute: 0.7 10*3/uL (ref 0.1–1.0)
Monocytes Relative: 8 %
NEUTROS PCT: 62 %
Neutro Abs: 5 10*3/uL (ref 1.7–7.7)
Platelets: 264 10*3/uL (ref 150–400)
RBC: 3.84 MIL/uL — ABNORMAL LOW (ref 3.87–5.11)
RDW: 18.2 % — ABNORMAL HIGH (ref 11.5–15.5)
WBC: 8.2 10*3/uL (ref 4.0–10.5)

## 2017-06-05 LAB — BASIC METABOLIC PANEL
Anion gap: 9 (ref 5–15)
BUN: 9 mg/dL (ref 6–20)
CHLORIDE: 97 mmol/L — AB (ref 101–111)
CO2: 32 mmol/L (ref 22–32)
Calcium: 8.4 mg/dL — ABNORMAL LOW (ref 8.9–10.3)
Creatinine, Ser: 0.74 mg/dL (ref 0.44–1.00)
GFR calc non Af Amer: >60 mL/min (ref >60)
Glucose, Bld: 86 mg/dL (ref 65–99)
Potassium: 3.6 mmol/L (ref 3.5–5.1)
SODIUM: 138 mmol/L (ref 135–145)

## 2017-06-05 LAB — ECHOCARDIOGRAM COMPLETE
Height: 55 in
WEIGHTICAEL: 1527.35 [oz_av]

## 2017-06-05 LAB — MAGNESIUM: MAGNESIUM: 1.4 mg/dL — AB (ref 1.7–2.4)

## 2017-06-05 MED ORDER — FUROSEMIDE 10 MG/ML IJ SOLN
40.0000 mg | Freq: Two times a day (BID) | INTRAMUSCULAR | Status: DC
  Administered 2017-06-05 – 2017-06-08 (×7): 40 mg via INTRAVENOUS
  Filled 2017-06-05 (×7): qty 4

## 2017-06-05 NOTE — Progress Notes (Signed)
OR for DL Biopsy on Monday, 4/15. Please keep NPO after midnight for the OR on Monday.   Dr. Henderson Cloud [facility] [place] [facility] Provider Office [phone]

## 2017-06-05 NOTE — Progress Notes (Signed)
  Echocardiogram 2D Echocardiogram has been performed.  Merrie Roof F 06/05/2017, 10:19 AM

## 2017-06-05 NOTE — Progress Notes (Addendum)
  Speech Language Pathology Treatment: Dysphagia;Cognitive-Linquistic;Passy Muir Speaking valve  Patient Details Name: Katelyn [name] MRN: [id number] DOB: [birth year] Today's Date: 06/05/2017 Time: 8916-9450 SLP Time Calculation (min) (ACUTE ONLY): 24 min  Assessment / Plan / Recommendation Clinical Impression  ST follow up for therapeutic diet tolerance, ST therapy to address alternative communication and possible placement of PMSV.   Patient was noted to have her cuff deflated when ST entered room.  Finger occlusion led to a strong cough response from the patient with back pressure suggestive of air trapping.  Therefore, PMSV was not placed.  Char review indicated that the patient has been afebrile, intake has been poor and lungs have been variable.  Nursing reporting no issues with intake.  Meal observation was completed using canned peaches and thin liquids via straw sips.  No overt s/s of aspiration were seen.  The patient appeared to be tolerating the diet well. ST therapy was completed to address possible use of alternative communication.  Patient was able to identify individual letters on a letter board and spell out her name and where she currently was ([facility] Hospital) but it was a very slow process.  She was able to identify pictures that depicted things she may need in her immediate environment but did appear to do better when number of choices were limited.  She was also able to point to written words to request various things.  Suggest use of word boards to allow the patient to communicate simple wants/needs.  She has also been attempting to communicate using writing but it is laborious for her.    Recommend continue with a dysphagia 3 diet with thin liquids.  Recommend use of word boards to facilitate effective communication to decrease patient frustration.  ST will continue to follow for therapy.    HPI HPI: 18yoF with hx CVA, DVT (2016), HTN, Anemia, Gallstone pancreatitis, and  Epiglottis mass, who presented with a hip fracture, now s/p operative repair. Intubated for procedure; postop patient developed respiratory distress and required emergent trach.       SLP Plan  Continue with current plan of care       Recommendations  Diet recommendations: Dysphagia 3 (mechanical soft);Thin liquid Liquids provided via: Cup;Straw Medication Administration: Whole meds with puree Supervision: Staff to assist with self feeding Compensations: Slow rate;Small sips/bites Postural Changes and/or Swallow Maneuvers: Seated upright 90 degrees      Patient may use Passy-Muir Speech Valve: with SLP only PMSV Supervision: Full MD: Please consider changing trach tube to : Smaller size;Cuffless         Oral Care Recommendations: Oral care QID Follow up Recommendations: Other (comment)(continued ST at next level of care.) SLP Visit Diagnosis: Dysphagia, oropharyngeal phase (R13.12) Plan: Continue with current plan of care       GO                Lamar Sprinkles 06/05/2017, 12:00 PM

## 2017-06-05 NOTE — Progress Notes (Signed)
PT Cancellation Note  Patient Details Name: Katelyn [name] MRN: [id number] DOB: [birth year]   Cancelled Treatment:    Reason Eval/Treat Not Completed: Patient at procedure or test/unavailable(pt off unit for Echo) PT will check on pt later as time allows.    Salina April, PTA Pager: [phone]   06/05/2017, 10:08 AM

## 2017-06-05 NOTE — Progress Notes (Signed)
Physical Therapy Treatment Patient Details Name: Katelyn [name] MRN: [id number] DOB: 1950/08/04 Today's Date: 06/05/2017    History of Present Illness 67 year old female with PMH of CVA, DVT, HTN, epiglottic lesion. At baseline she uses Pampers, requires assistance feeding and has chronic right-sided weakness secondary to CVA, non-compliant with walker. She presented to the ED on 4/5 with R hip pain s/p fall and was subsequently taken to the OR for Intramedullary Nail Intertrochanteric Femur    PT Comments    Patient was able to take a few steps EOB to recliner with +2 assist. Pt is reluctant to bear weight on R LE despite cues and relies heavily on UE assistance. Pt limited by pain and anxiety about mobility however agreeable to participate in therapy. Continue to progress as tolerated with anticipated d/c to SNF for further skilled PT services.      Follow Up Recommendations  SNF;Supervision/Assistance - 24 hour     Equipment Recommendations  (TBD)    Recommendations for Other Services       Precautions / Restrictions Precautions Precautions: Fall Precaution Comments: watch O2 saturations patient on ATC/trach Restrictions Weight Bearing Restrictions: Yes RLE Weight Bearing: Weight bearing as tolerated    Mobility  Bed Mobility Overal bed mobility: Needs Assistance Bed Mobility: Supine to Sit     Supine to sit: Mod assist;HOB elevated     General bed mobility comments: cues for sequencing and use of rail; use of bed pad to bring hips to EOB and assist to elevate trunk into sitting  Transfers Overall transfer level: Needs assistance Equipment used: 1 person hand held assist;Rolling walker (2 wheeled) Transfers: Sit to/from [facility] Sit to Stand: Mod assist Stand pivot transfers: Mod assist;+2 physical assistance       General transfer comment: initial sit to stand with RW however pt declined attempts to take steps and returned to sitting EOB;  second trial +2 HHA; cues for safety, posture, and sequencing  Ambulation/Gait             General Gait Details: pt took a few pivotal steps EOB to recliner; declined attempts of further gait progression    Stairs             Wheelchair Mobility    Modified Rankin (Stroke Patients Only)       Balance Overall balance assessment: History of Falls Sitting-balance support: No upper extremity supported;Feet supported Sitting balance-Leahy Scale: Fair     Standing balance support: Bilateral upper extremity supported Standing balance-Leahy Scale: Poor                              Cognition Arousal/Alertness: Awake/alert Behavior During Therapy: Anxious Overall Cognitive Status: Difficult to assess                                 General Comments: pt declined use of letter board but did communicate in writing a little       Exercises      General Comments        Pertinent Vitals/Pain Pain Assessment: Faces Faces Pain Scale: Hurts little more Pain Location: R hip Pain Descriptors / Indicators: Grimacing;Guarding Pain Intervention(s): Limited activity within patient's tolerance;Monitored during session;Repositioned;Patient requesting pain meds-RN notified    Home Living  Prior Function            PT Goals (current goals can now be found in the care plan section) Acute Rehab PT Goals PT Goal Formulation: With patient/family Time For Goal Achievement: 06/13/17 Potential to Achieve Goals: Fair Progress towards PT goals: Progressing toward goals    Frequency    Min 5X/week      PT Plan Current plan remains appropriate    Co-evaluation              AM-PAC PT "6 Clicks" Daily Activity  Outcome Measure  Difficulty turning over in bed (including adjusting bedclothes, sheets and blankets)?: Unable Difficulty moving from lying on back to sitting on the side of the bed? :  Unable Difficulty sitting down on and standing up from a chair with arms (e.g., wheelchair, bedside commode, etc,.)?: Unable Help needed moving to and from a bed to chair (including a wheelchair)?: A Lot Help needed walking in hospital room?: A Lot Help needed climbing 3-5 steps with a railing? : Total 6 Click Score: 8    End of Session Equipment Utilized During Treatment: Gait belt;Oxygen Activity Tolerance: Patient limited by pain;Other (comment)(anxious about mobility) Patient left: in chair;with call bell/phone within reach Nurse Communication: Mobility status PT Visit Diagnosis: Muscle weakness (generalized) (M62.81);History of falling (Z91.81);Pain;Difficulty in walking, not elsewhere classified (R26.2) Pain - Right/Left: Right Pain - part of body: Hip     Time: 1610-9604 PT Time Calculation (min) (ACUTE ONLY): 28 min  Charges:  $Therapeutic Activity: 23-37 mins                    G Codes:       Earney Navy, PTA Pager: [phone]     Darliss Cheney 06/05/2017, 1:59 PM

## 2017-06-05 NOTE — Progress Notes (Signed)
Patient ID: Katelyn [name], female   DOB: 04-26-1950, 67 y.o.   MRN: [id number]  PROGRESS NOTE    Lubna Stegeman  IHK:[id number] DOB: Sep 22, 1950 DOA: 05/29/2017 PCP: Aretta Nip, MD   Brief Narrative:  67 year old female with history of stroke with residual right-sided weakness, DVT not on anticoagulation, hypertension, gallstone pancolitis, tobacco use, epiglottis lesion present on on 05/29/2017 with right hip pain after a fall.  She was found to have right hip fracture.  Orthopedics was consulted.  Patient underwent surgical intervention by orthopedics.  Postprocedure, patient was obtunded and hypoxic and patient ended up having a tracheostomy because of epiglottic mass.  She was transferred to ICU for further care.  ENT was consulted for the epiglottic mass.  Her condition has stabilized and she was transferred to [facility] care from 06/04/2017.  Assessment & Plan:   Principal Problem:   Closed displaced intertrochanteric fracture of right femur ([place]) Active Problems:   Hypercholesteremia   Essential hypertension   Ataxia following cerebral infarction   CKD (chronic kidney disease) stage 3, GFR 30-59 ml/min (HCC)   Diastolic dysfunction   Tobacco abuse   Anemia   Lesion of epiglottis   Dysphagia   Weight loss   Difficult airway for intubation   Pressure injury of skin   Malnutrition of moderate degree   Acute hypoxic respite failure secondary to epiglottic mass status post tracheostomy -Off ventilator.  Now with trach collar.  Follow further recommendations from pulmonary -Currently on 5 L oxygen -Chest x-ray from today shows bilateral pleural effusions -Diuretic plan as below  Right hip fracture status post fall -Status post surgical intervention by orthopedics on 05/29/2017 -Wound care as per orthopedics recommendations. -Fall precautions -PT/OT evaluation  Chronic diastolic heart failure -Currently compensated.  Lasix and Coreg were held initially. -Strict input and  output.  Daily weights.  Positive balance of 6055.8 mL since admission.    -Patient got 1 dose of IV Lasix yesterday.  Increase Lasix to 40 mill grams IV every 12 hours  Leukocytosis -Probably reactive.  Resolved.  Repeat a.m. Labs  Epiglottic mass -ENT following.  Plan for biopsy on Monday.  Chronic kidney disease stage III -Creatinine stable.  Monitor  Anemia -Probably secondary to anemia of chronic disease.  Hemoglobin stable.  History of stroke and DVT -Patient not on anticoagulation.  Outpatient follow-up with primary care provider  Tobacco abuse -We will continue to have cessation counseling.  Noncompliance -Patient will have to be more compliant  Hypertension -Blood pressure stable.  Noncompliant to meds as an outpatient.  Continue Lasix.  Generalized deconditioning -PT/OT eval.  Will need probable nursing home placement   DVT prophylaxis: Heparin Code Status: Full  family Communication: None at bedside Disposition Plan: Nursing home once cleared by ENT  Consultants: PCCM/General surgery/ENT  Procedures:  Tracheostomy on 05/29/2017 Right hip surgery with intramedullary implant on 05/29/2017  Antimicrobials:  None   Subjective: Patient seen and examined at bedside.  She is sleepy, hardly wakes up on calling her name.  Cannot speak because of tracheostomy.  No overnight fever or vomiting reported Objective: Vitals:   06/05/17 0440 06/05/17 0621 06/05/17 0830 06/05/17 1119  BP:  126/82    Pulse: 76 72 68 (!) 105  Resp: '18 20 20 20  ' Temp:  97.9 F (36.6 C)    TempSrc:  Oral    SpO2: 100% 98% 97% 97%  Weight:  43.3 kg (95 lb 7.4 oz)    Height:  Intake/Output Summary (Last 24 hours) at 06/05/2017 1433 Last data filed at 06/05/2017 5462 Gross per 24 hour  Intake -  Output 2275 ml  Net -2275 ml   Filed Weights   06/03/17 0400 06/04/17 2100 06/05/17 0621  Weight: 44.3 kg (97 lb 10.6 oz) 44.3 kg (97 lb 10.6 oz) 43.3 kg (95 lb 7.4 oz)     Examination:  General exam: Looks older than stated age.  Thinly built.  No distress.  Sleepy Respiratory system: Bilateral decreased breath sounds at bases with basilar crackles cardiovascular system: S1 & S2 heard, rate controlled  gastrointestinal system: Abdomen is nondistended, soft and nontender. Normal bowel sounds heard. Central nervous system: Awake. No focal neurological deficits. Moving extremities Extremities: No cyanosis, clubbing; trace edema      Data Reviewed: I have personally reviewed following labs and imaging studies  CBC: Recent Labs  Lab 05/29/17 2100 05/30/17 0341 05/31/17 0313 06/02/17 0308 06/05/17 0634  WBC 17.5* 12.1* 8.5 5.8 8.2  NEUTROABS 16.2*  --   --  3.7 5.0  HGB 7.2* 6.3* 9.5* 9.1* 10.1*  HCT 25.4* 20.8* 30.0* 29.6* 32.8*  MCV 84.9 83.9 81.7 85.1 85.4  PLT 273 226 211 212 703   Basic Metabolic Panel: Recent Labs  Lab 05/29/17 2100 05/30/17 0341 05/31/17 0313 06/02/17 0308 06/05/17 0634  NA 139 137 139 136 138  K 4.2 3.5 4.4 3.8 3.6  CL 99* 98* 102 102 97*  CO2 '28 25 25 27 ' 32  GLUCOSE 145* 98 71 92 86  BUN 20 23* '20 10 9  ' CREATININE 0.92 0.94 0.86 0.68 0.74  CALCIUM 9.0 8.7* 8.9 8.8* 8.4*  MG 1.9 1.8  --   --  1.4*  PHOS 5.0* 2.2*  --   --   --    GFR: Estimated Creatinine Clearance: 40.6 mL/min (by C-G formula based on SCr of 0.74 mg/dL). Liver Function Tests: Recent Labs  Lab 05/29/17 2100  AST 30  ALT 14  ALKPHOS 66  BILITOT 0.5  PROT 7.1  ALBUMIN 2.3*   No results for input(s): LIPASE, AMYLASE in the last 168 hours. No results for input(s): AMMONIA in the last 168 hours. Coagulation Profile: Recent Labs  Lab 05/29/17 1526 05/29/17 2100  INR 1.14 1.15   Cardiac Enzymes: No results for input(s): CKTOTAL, CKMB, CKMBINDEX, TROPONINI in the last 168 hours. BNP (last 3 results) No results for input(s): PROBNP in the last 8760 hours. HbA1C: No results for input(s): HGBA1C in the last 72 hours. CBG: Recent  Labs  Lab 05/30/17 0404 05/30/17 0714  GLUCAP 88 94   Lipid Profile: No results for input(s): CHOL, HDL, LDLCALC, TRIG, CHOLHDL, LDLDIRECT in the last 72 hours. Thyroid Function Tests: No results for input(s): TSH, T4TOTAL, FREET4, T3FREE, THYROIDAB in the last 72 hours. Anemia Panel: No results for input(s): VITAMINB12, FOLATE, FERRITIN, TIBC, IRON, RETICCTPCT in the last 72 hours. Sepsis Labs: No results for input(s): PROCALCITON, LATICACIDVEN in the last 168 hours.  Recent Results (from the past 240 hour(s))  MRSA PCR Screening     Status: None   Collection Time: 05/29/17  9:02 PM  Result Value Ref Range Status   MRSA by PCR NEGATIVE NEGATIVE Final    Comment:        The GeneXpert MRSA Assay (FDA approved for NASAL specimens only), is one component of a comprehensive MRSA colonization surveillance program. It is not intended to diagnose MRSA infection nor to guide or monitor treatment for MRSA infections. Performed at  [facility], [place] [address], [place], [place] [address]          Radiology Studies: Dg Chest Port 1 View  Result Date: 06/05/2017 CLINICAL DATA:  Dyspnea EXAM: PORTABLE CHEST 1 VIEW COMPARISON:  06/02/2017 FINDINGS: Tracheostomy tube is noted in satisfactory position. Cardiac shadow is within normal limits. Small bilateral pleural effusions are again seen and stable. The degree of right basilar infiltrate has improved slightly in the interval from the prior exam. No new focal infiltrate is seen. IMPRESSION: Small bilateral pleural effusions and right basilar infiltrates slightly improved from the prior exam. Electronically Signed   By: Inez Catalina M.D.   On: 06/05/2017 08:16        Scheduled Meds: . chlorhexidine gluconate (MEDLINE KIT)  15 mL Mouth Rinse BID  . chlorhexidine gluconate (MEDLINE KIT)  15 mL Mouth Rinse BID  . docusate  100 mg Per Tube BID  . feeding supplement (ENSURE ENLIVE)  237 mL Oral BID BM  . furosemide  40 mg  Intravenous Daily  . heparin  5,000 Units Subcutaneous Q8H  . pantoprazole sodium  40 mg Per Tube QHS  . polyethylene glycol  17 g Per Tube BID   Continuous Infusions: . sodium chloride       LOS: 7 days        Aline August, MD [facility] Pager [phone]  If 7PM-7AM, please contact night-coverage [email or web address] Password [facility] 06/05/2017, 2:33 PM

## 2017-06-05 NOTE — Plan of Care (Signed)
  Problem: Spiritual Needs Goal: Ability to function at adequate level Outcome: Progressing   Problem: Clinical Measurements: Goal: Postoperative complications will be avoided or minimized Outcome: Progressing   Problem: Skin Integrity: Goal: Demonstration of wound healing without infection will improve Outcome: Progressing   Problem: Health Behavior/Discharge Planning: Goal: Ability to manage health-related needs will improve Outcome: Progressing   Problem: Clinical Measurements: Goal: Ability to maintain clinical measurements within normal limits will improve Outcome: Progressing Goal: Will remain free from infection Outcome: Progressing Goal: Diagnostic test results will improve Outcome: Progressing Goal: Respiratory complications will improve Outcome: Progressing Goal: Cardiovascular complication will be avoided Outcome: Progressing   Problem: Activity: Goal: Risk for activity intolerance will decrease Outcome: Progressing   Problem: Nutrition: Goal: Adequate nutrition will be maintained Outcome: Progressing   Problem: Coping: Goal: Level of anxiety will decrease Outcome: Progressing   Problem: Elimination: Goal: Will not experience complications related to bowel motility Outcome: Progressing Goal: Will not experience complications related to urinary retention Outcome: Progressing   Problem: Pain Managment: Goal: General experience of comfort will improve Outcome: Progressing   Problem: Safety: Goal: Ability to remain free from injury will improve Outcome: Progressing   Problem: Skin Integrity: Goal: Risk for impaired skin integrity will decrease Outcome: Progressing

## 2017-06-06 LAB — MAGNESIUM: Magnesium: 1.5 mg/dL — ABNORMAL LOW (ref 1.7–2.4)

## 2017-06-06 LAB — CBC WITH DIFFERENTIAL/PLATELET
Basophils Absolute: 0 10*3/uL (ref 0.0–0.1)
Basophils Relative: 0 %
EOS ABS: 0.1 10*3/uL (ref 0.0–0.7)
EOS PCT: 1 %
HCT: 32.2 % — ABNORMAL LOW (ref 36.0–46.0)
Hemoglobin: 10.2 g/dL — ABNORMAL LOW (ref 12.0–15.0)
LYMPHS ABS: 2.3 10*3/uL (ref 0.7–4.0)
LYMPHS PCT: 27 %
MCH: 26.8 pg (ref 26.0–34.0)
MCHC: 31.7 g/dL (ref 30.0–36.0)
MCV: 84.5 fL (ref 78.0–100.0)
MONO ABS: 1.1 10*3/uL — AB (ref 0.1–1.0)
MONOS PCT: 13 %
Neutro Abs: 5 10*3/uL (ref 1.7–7.7)
Neutrophils Relative %: 59 %
PLATELETS: 273 10*3/uL (ref 150–400)
RBC: 3.81 MIL/uL — ABNORMAL LOW (ref 3.87–5.11)
RDW: 18.6 % — ABNORMAL HIGH (ref 11.5–15.5)
WBC: 8.4 10*3/uL (ref 4.0–10.5)

## 2017-06-06 LAB — BASIC METABOLIC PANEL
Anion gap: 9 (ref 5–15)
BUN: 11 mg/dL (ref 6–20)
CO2: 37 mmol/L — ABNORMAL HIGH (ref 22–32)
Calcium: 8.7 mg/dL — ABNORMAL LOW (ref 8.9–10.3)
Chloride: 93 mmol/L — ABNORMAL LOW (ref 101–111)
Creatinine, Ser: 0.81 mg/dL (ref 0.44–1.00)
GFR calc Af Amer: >60 mL/min (ref >60)
GFR calc non Af Amer: >60 mL/min (ref >60)
Glucose, Bld: 84 mg/dL (ref 65–99)
Potassium: 3.3 mmol/L — ABNORMAL LOW (ref 3.5–5.1)
Sodium: 139 mmol/L (ref 135–145)

## 2017-06-06 MED ORDER — POTASSIUM CHLORIDE CRYS ER 20 MEQ PO TBCR
40.0000 meq | EXTENDED_RELEASE_TABLET | ORAL | Status: AC
  Administered 2017-06-06 (×2): 40 meq via ORAL
  Filled 2017-06-06 (×2): qty 2

## 2017-06-06 MED ORDER — MAGNESIUM SULFATE 2 GM/50ML IV SOLN
2.0000 g | Freq: Once | INTRAVENOUS | Status: AC
  Administered 2017-06-06: 2 g via INTRAVENOUS
  Filled 2017-06-06: qty 50

## 2017-06-06 NOTE — Progress Notes (Signed)
Patient ID: Katelyn [name], female   DOB: 1950-08-13, 67 y.o.   MRN: [id number]  PROGRESS NOTE    Katelyn [name]  KAJ:[id number] DOB: [birth year] DOA: 05/29/2017 PCP: Aretta Nip, MD   Brief Narrative:  67 year old female with history of stroke with residual right-sided weakness, DVT not on anticoagulation, hypertension, gallstone pancolitis, tobacco use, epiglottis lesion present on on 05/29/2017 with right hip pain after a fall.  She was found to have right hip fracture.  Orthopedics was consulted.  Patient underwent surgical intervention by orthopedics.  Postprocedure, patient was obtunded and hypoxic and patient ended up having a tracheostomy because of epiglottic mass.  She was transferred to ICU for further care.  ENT was consulted for the epiglottic mass.  Her condition has stabilized and she was transferred to [facility] care from 06/04/2017.  Assessment & Plan:   Principal Problem:   Closed displaced intertrochanteric fracture of right femur ([place]) Active Problems:   Hypercholesteremia   Essential hypertension   Ataxia following cerebral infarction   CKD (chronic kidney disease) stage 3, GFR 30-59 ml/min (HCC)   Diastolic dysfunction   Tobacco abuse   Anemia   Lesion of epiglottis   Dysphagia   Weight loss   Difficult airway for intubation   Pressure injury of skin   Malnutrition of moderate degree   Acute hypoxic respite failure secondary to epiglottic mass status post tracheostomy -Off ventilator.  Now with trach collar.  Follow further recommendations from pulmonary -Currently on 5 L oxygen -Diuretic plan as below  Right hip fracture status post fall -Status post surgical intervention by orthopedics on 05/29/2017 -Wound care as per orthopedics recommendations. -Fall precautions -PT/OT evaluation -Continue pain management  Chronic diastolic heart failure -Currently compensated.  Lasix and Coreg were held initially. -Strict input and output.  Daily weights.  Positive  balance of 5305.8 mL since admission.    -Diuresing well.  Continue intravenous Lasix  Leukocytosis -Probably reactive.  Resolved.  Repeat a.m. Labs  Epiglottic mass -ENT following.  Plan for biopsy on Monday.  Chronic kidney disease stage III -Creatinine stable.  Monitor  Anemia -Probably secondary to anemia of chronic disease.  Hemoglobin stable.  History of stroke and DVT -Patient not on anticoagulation.  Outpatient follow-up with primary care provider  Tobacco abuse -We will continue to have cessation counseling.  Noncompliance -Patient will have to be more compliant  Hypertension -Blood pressure stable.  Noncompliant to meds as an outpatient.  Continue Lasix.  Generalized deconditioning -PT/OT eval.  Will need probable nursing home placement  Hypokalemia -Replace.  Repeat a.m. labs  Hypomagnesemia -Replace and repeat a.m. labs.   DVT prophylaxis: Heparin Code Status: Full  family Communication: Spoke to daughter-in-law at bedside Disposition Plan: Nursing home once cleared by ENT  Consultants: PCCM/General surgery/ENT  Procedures:  Tracheostomy on 05/29/2017 Right hip surgery with intramedullary implant on 05/29/2017  Antimicrobials:  None   Subjective: Patient seen and examined at bedside.  She is awake.  She cannot talk because of tracheostomy.  She communicates by nodding her head.  Spoke to daughter-in-law at bedside.  No overnight fever or vomiting. Objective: Vitals:   06/06/17 0338 06/06/17 0425 06/06/17 0850 06/06/17 0932  BP: 139/68  121/82   Pulse: 62 61 66 80  Resp:  '18 18 18  ' Temp: 97.7 F (36.5 C)  97.8 F (36.6 C)   TempSrc: Oral  Oral   SpO2: 100% 98% 100% 99%  Weight:      Height:  Intake/Output Summary (Last 24 hours) at 06/06/2017 1156 Last data filed at 06/06/2017 0326 Gross per 24 hour  Intake -  Output 750 ml  Net -750 ml   Filed Weights   06/03/17 0400 06/04/17 2100 06/05/17 0621  Weight: 44.3 kg (97 lb 10.6 oz)  44.3 kg (97 lb 10.6 oz) 43.3 kg (95 lb 7.4 oz)    Examination:  General exam: Looks older than stated age.  Thinly built.  No distress.  Awake.  Respiratory system: Bilateral decreased breath sounds at bases with scattered basilar crackles  cardiovascular system: S1 & S2 heard, rate controlled  gastrointestinal system: Abdomen is nondistended, soft and nontender. Normal bowel sounds heard. Central nervous system: Awake. No focal neurological deficits. Moving extremities Extremities: No cyanosis; trace edema      Data Reviewed: I have personally reviewed following labs and imaging studies  CBC: Recent Labs  Lab 05/31/17 0313 06/02/17 0308 06/05/17 0634 06/06/17 0520  WBC 8.5 5.8 8.2 8.4  NEUTROABS  --  3.7 5.0 5.0  HGB 9.5* 9.1* 10.1* 10.2*  HCT 30.0* 29.6* 32.8* 32.2*  MCV 81.7 85.1 85.4 84.5  PLT 211 212 264 892   Basic Metabolic Panel: Recent Labs  Lab 05/31/17 0313 06/02/17 0308 06/05/17 0634 06/06/17 0520  NA 139 136 138 139  K 4.4 3.8 3.6 3.3*  CL 102 102 97* 93*  CO2 25 27 32 37*  GLUCOSE 71 92 86 84  BUN '20 10 9 11  ' CREATININE 0.86 0.68 0.74 0.81  CALCIUM 8.9 8.8* 8.4* 8.7*  MG  --   --  1.4* 1.5*   GFR: Estimated Creatinine Clearance: 40.1 mL/min (by C-G formula based on SCr of 0.81 mg/dL). Liver Function Tests: No results for input(s): AST, ALT, ALKPHOS, BILITOT, PROT, ALBUMIN in the last 168 hours. No results for input(s): LIPASE, AMYLASE in the last 168 hours. No results for input(s): AMMONIA in the last 168 hours. Coagulation Profile: No results for input(s): INR, PROTIME in the last 168 hours. Cardiac Enzymes: No results for input(s): CKTOTAL, CKMB, CKMBINDEX, TROPONINI in the last 168 hours. BNP (last 3 results) No results for input(s): PROBNP in the last 8760 hours. HbA1C: No results for input(s): HGBA1C in the last 72 hours. CBG: No results for input(s): GLUCAP in the last 168 hours. Lipid Profile: No results for input(s): CHOL, HDL,  LDLCALC, TRIG, CHOLHDL, LDLDIRECT in the last 72 hours. Thyroid Function Tests: No results for input(s): TSH, T4TOTAL, FREET4, T3FREE, THYROIDAB in the last 72 hours. Anemia Panel: No results for input(s): VITAMINB12, FOLATE, FERRITIN, TIBC, IRON, RETICCTPCT in the last 72 hours. Sepsis Labs: No results for input(s): PROCALCITON, LATICACIDVEN in the last 168 hours.  Recent Results (from the past 240 hour(s))  MRSA PCR Screening     Status: None   Collection Time: 05/29/17  9:02 PM  Result Value Ref Range Status   MRSA by PCR NEGATIVE NEGATIVE Final    Comment:        The GeneXpert MRSA Assay (FDA approved for NASAL specimens only), is one component of a comprehensive MRSA colonization surveillance program. It is not intended to diagnose MRSA infection nor to guide or monitor treatment for MRSA infections. Performed at [facility], [place] [address], [place], [place] [address]          Radiology Studies: Dg Chest Port 1 View  Result Date: 06/05/2017 CLINICAL DATA:  Dyspnea EXAM: PORTABLE CHEST 1 VIEW COMPARISON:  06/02/2017 FINDINGS: Tracheostomy tube is noted in satisfactory  position. Cardiac shadow is within normal limits. Small bilateral pleural effusions are again seen and stable. The degree of right basilar infiltrate has improved slightly in the interval from the prior exam. No new focal infiltrate is seen. IMPRESSION: Small bilateral pleural effusions and right basilar infiltrates slightly improved from the prior exam. Electronically Signed   By: Inez Catalina M.D.   On: 06/05/2017 08:16        Scheduled Meds: . chlorhexidine gluconate (MEDLINE KIT)  15 mL Mouth Rinse BID  . chlorhexidine gluconate (MEDLINE KIT)  15 mL Mouth Rinse BID  . docusate  100 mg Per Tube BID  . feeding supplement (ENSURE ENLIVE)  237 mL Oral BID BM  . furosemide  40 mg Intravenous Q12H  . heparin  5,000 Units Subcutaneous Q8H  . pantoprazole sodium  40 mg Per Tube QHS  . polyethylene  glycol  17 g Per Tube BID  . potassium chloride  40 mEq Oral Q4H   Continuous Infusions: . sodium chloride       LOS: 8 days        Aline August, MD [facility] Pager [phone]  If 7PM-7AM, please contact night-coverage [email or web address] Password [facility] 06/06/2017, 11:56 AM

## 2017-06-07 DIAGNOSIS — R634 Abnormal weight loss: Secondary | ICD-10-CM

## 2017-06-07 LAB — BASIC METABOLIC PANEL
Anion gap: 12 (ref 5–15)
BUN: 10 mg/dL (ref 6–20)
CO2: 35 mmol/L — ABNORMAL HIGH (ref 22–32)
Calcium: 8.9 mg/dL (ref 8.9–10.3)
Chloride: 90 mmol/L — ABNORMAL LOW (ref 101–111)
Creatinine, Ser: 0.9 mg/dL (ref 0.44–1.00)
GFR calc Af Amer: >60 mL/min (ref >60)
GFR calc non Af Amer: >60 mL/min (ref >60)
Glucose, Bld: 93 mg/dL (ref 65–99)
Potassium: 4.8 mmol/L (ref 3.5–5.1)
Sodium: 137 mmol/L (ref 135–145)

## 2017-06-07 LAB — MAGNESIUM: Magnesium: 2 mg/dL (ref 1.7–2.4)

## 2017-06-07 NOTE — Progress Notes (Signed)
Patient ID: Katelyn [name], female   DOB: 1950-11-26, 67 y.o.   MRN: [id number]  PROGRESS NOTE    Donnelle Rubey  OIZ:[id number] DOB: December 24, 1950 DOA: 05/29/2017 PCP: Aretta Nip, MD   Brief Narrative:  67 year old female with history of stroke with residual right-sided weakness, DVT not on anticoagulation, hypertension, gallstone pancolitis, tobacco use, epiglottis lesion present on on 05/29/2017 with right hip pain after a fall.  She was found to have right hip fracture.  Orthopedics was consulted.  Patient underwent surgical intervention by orthopedics.  Postprocedure, patient was obtunded and hypoxic and patient ended up having a tracheostomy because of epiglottic mass.  She was transferred to ICU for further care.  ENT was consulted for the epiglottic mass.  Her condition has stabilized and she was transferred to [facility] care from 06/04/2017.  Assessment & Plan:   Principal Problem:   Closed displaced intertrochanteric fracture of right femur ([place]) Active Problems:   Hypercholesteremia   Essential hypertension   Ataxia following cerebral infarction   CKD (chronic kidney disease) stage 3, GFR 30-59 ml/min (HCC)   Diastolic dysfunction   Tobacco abuse   Anemia   Lesion of epiglottis   Dysphagia   Weight loss   Difficult airway for intubation   Pressure injury of skin   Malnutrition of moderate degree   Acute hypoxic respite failure secondary to epiglottic mass status post tracheostomy -Off ventilator.  Now with trach collar.  Follow further recommendations from pulmonary -Currently on 5 L oxygen -Diuretic plan as below  Right hip fracture status post fall -Status post surgical intervention by orthopedics on 05/29/2017 -Wound care as per orthopedics recommendations. -Fall precautions -PT/OT evaluation -Continue pain management  Chronic diastolic heart failure -Currently compensated.  Lasix and Coreg were held initially. -Strict input and output.  Daily weights.  Positive  balance of 3475.8 mL since admission.    -Diuresing well.  Continue intravenous Lasix  Leukocytosis -Probably reactive.  Resolved.  Repeat a.m. Labs  Epiglottic mass -ENT following.  Plan for biopsy on Monday.  Chronic kidney disease stage III -Creatinine stable.  Monitor  Anemia -Probably secondary to anemia of chronic disease.  Hemoglobin stable.  History of stroke and DVT -Patient not on anticoagulation.  Outpatient follow-up with primary care provider  Tobacco abuse -We will continue to have cessation counseling.  Noncompliance -Patient will have to be more compliant  Hypertension -Blood pressure stable.  Noncompliant to meds as an outpatient.  Continue Lasix.  Generalized deconditioning -PT/OT eval.  Will need probable nursing home placement  Hypokalemia -Improved  Hypomagnesemia -Improved   DVT prophylaxis: Heparin Code Status: Full  family Communication: Spoke to daughter at bedside Disposition Plan: Nursing home once cleared by ENT  Consultants: PCCM/General surgery/ENT  Procedures:  Tracheostomy on 05/29/2017 Right hip surgery with intramedullary implant on 05/29/2017  Antimicrobials:  None   Subjective: Patient seen and examined at bedside.  She is awake.  She cannot talk because of tracheostomy.  She communicates by nodding her head.  Spoke to daughter at bedside.  No overnight fever, nausea or vomiting reported.    Objective: Vitals:   06/06/17 2300 06/07/17 0319 06/07/17 0615 06/07/17 0933  BP:   121/78   Pulse:   77 79  Resp:    18  Temp:   (!) 97.5 F (36.4 C)   TempSrc:   Oral   SpO2: 100% 98% 99% 98%  Weight:      Height:        Intake/Output Summary (Last  24 hours) at 06/07/2017 1020 Last data filed at 06/07/2017 0630 Gross per 24 hour  Intake 170 ml  Output 2000 ml  Net -1830 ml   Filed Weights   06/03/17 0400 06/04/17 2100 06/05/17 0621  Weight: 44.3 kg (97 lb 10.6 oz) 44.3 kg (97 lb 10.6 oz) 43.3 kg (95 lb 7.4 oz)     Examination:  General exam: Very thinly built female.  No distress.  Awake.  Tracheostomy in place.  Respiratory system: Bilateral decreased breath sounds at bases with some scattered crackles  cardiovascular system: S1 & S2 heard, rate controlled  gastrointestinal system: Abdomen is nondistended, soft and nontender. Normal bowel sounds heard. Extremities: No cyanosis; trace edema      Data Reviewed: I have personally reviewed following labs and imaging studies  CBC: Recent Labs  Lab 06/02/17 0308 06/05/17 0634 06/06/17 0520  WBC 5.8 8.2 8.4  NEUTROABS 3.7 5.0 5.0  HGB 9.1* 10.1* 10.2*  HCT 29.6* 32.8* 32.2*  MCV 85.1 85.4 84.5  PLT 212 264 353   Basic Metabolic Panel: Recent Labs  Lab 06/02/17 0308 06/05/17 0634 06/06/17 0520 06/07/17 0719  NA 136 138 139 137  K 3.8 3.6 3.3* 4.8  CL 102 97* 93* 90*  CO2 27 32 37* 35*  GLUCOSE 92 86 84 93  BUN '10 9 11 10  ' CREATININE 0.68 0.74 0.81 0.90  CALCIUM 8.8* 8.4* 8.7* 8.9  MG  --  1.4* 1.5* 2.0   GFR: Estimated Creatinine Clearance: 36.1 mL/min (by C-G formula based on SCr of 0.9 mg/dL). Liver Function Tests: No results for input(s): AST, ALT, ALKPHOS, BILITOT, PROT, ALBUMIN in the last 168 hours. No results for input(s): LIPASE, AMYLASE in the last 168 hours. No results for input(s): AMMONIA in the last 168 hours. Coagulation Profile: No results for input(s): INR, PROTIME in the last 168 hours. Cardiac Enzymes: No results for input(s): CKTOTAL, CKMB, CKMBINDEX, TROPONINI in the last 168 hours. BNP (last 3 results) No results for input(s): PROBNP in the last 8760 hours. HbA1C: No results for input(s): HGBA1C in the last 72 hours. CBG: No results for input(s): GLUCAP in the last 168 hours. Lipid Profile: No results for input(s): CHOL, HDL, LDLCALC, TRIG, CHOLHDL, LDLDIRECT in the last 72 hours. Thyroid Function Tests: No results for input(s): TSH, T4TOTAL, FREET4, T3FREE, THYROIDAB in the last 72  hours. Anemia Panel: No results for input(s): VITAMINB12, FOLATE, FERRITIN, TIBC, IRON, RETICCTPCT in the last 72 hours. Sepsis Labs: No results for input(s): PROCALCITON, LATICACIDVEN in the last 168 hours.  Recent Results (from the past 240 hour(s))  MRSA PCR Screening     Status: None   Collection Time: 05/29/17  9:02 PM  Result Value Ref Range Status   MRSA by PCR NEGATIVE NEGATIVE Final    Comment:        The GeneXpert MRSA Assay (FDA approved for NASAL specimens only), is one component of a comprehensive MRSA colonization surveillance program. It is not intended to diagnose MRSA infection nor to guide or monitor treatment for MRSA infections. Performed at [facility], [place] [address], [place], [place] [address]          Radiology Studies: No results found.      Scheduled Meds: . chlorhexidine gluconate (MEDLINE KIT)  15 mL Mouth Rinse BID  . feeding supplement (ENSURE ENLIVE)  237 mL Oral BID BM  . furosemide  40 mg Intravenous Q12H  . heparin  5,000 Units Subcutaneous Q8H  . pantoprazole  sodium  40 mg Per Tube QHS  . polyethylene glycol  17 g Per Tube BID   Continuous Infusions:    LOS: 9 days        Aline August, MD [facility] Pager [phone]  If 7PM-7AM, please contact night-coverage [email or web address] Password [email or web address] 06/07/2017, 10:20 AM

## 2017-06-08 ENCOUNTER — Ambulatory Visit (HOSPITAL_COMMUNITY): Admission: RE | Admit: 2017-06-08 | Payer: Medicare HMO | Source: Ambulatory Visit | Admitting: Otolaryngology

## 2017-06-08 ENCOUNTER — Other Ambulatory Visit: Payer: Self-pay

## 2017-06-08 ENCOUNTER — Inpatient Hospital Stay (HOSPITAL_COMMUNITY): Payer: Medicare HMO | Admitting: Certified Registered Nurse Anesthetist

## 2017-06-08 ENCOUNTER — Encounter (HOSPITAL_COMMUNITY)
Admission: EM | Disposition: A | Discharge status: Skilled Nursing Facility | Payer: Self-pay | Source: Home / Self Care | Attending: Internal Medicine

## 2017-06-08 ENCOUNTER — Encounter (HOSPITAL_COMMUNITY): Payer: Self-pay | Admitting: *Deleted

## 2017-06-08 DIAGNOSIS — I5032 Chronic diastolic (congestive) heart failure: Secondary | ICD-10-CM

## 2017-06-08 HISTORY — PX: DIRECT LARYNGOSCOPY: SHX5326

## 2017-06-08 LAB — BASIC METABOLIC PANEL
Anion gap: 10 (ref 5–15)
BUN: 18 mg/dL (ref 6–20)
CO2: 37 mmol/L — ABNORMAL HIGH (ref 22–32)
Calcium: 8.7 mg/dL — ABNORMAL LOW (ref 8.9–10.3)
Chloride: 87 mmol/L — ABNORMAL LOW (ref 101–111)
Creatinine, Ser: 0.97 mg/dL (ref 0.44–1.00)
GFR calc Af Amer: >60 mL/min (ref >60)
GFR calc non Af Amer: 59 mL/min — ABNORMAL LOW (ref >60)
Glucose, Bld: 83 mg/dL (ref 65–99)
Potassium: 4.5 mmol/L (ref 3.5–5.1)
Sodium: 134 mmol/L — ABNORMAL LOW (ref 135–145)

## 2017-06-08 LAB — MAGNESIUM: Magnesium: 2 mg/dL (ref 1.7–2.4)

## 2017-06-08 SURGERY — LARYNGOSCOPY, DIRECT
Anesthesia: General

## 2017-06-08 MED ORDER — ACETAMINOPHEN 10 MG/ML IV SOLN: 1000.0000 mg | Freq: Once | INTRAVENOUS | Status: DC | PRN

## 2017-06-08 MED ORDER — 0.9 % SODIUM CHLORIDE (POUR BTL) OPTIME
TOPICAL | Status: DC | PRN
  Administered 2017-06-08: 1000 mL

## 2017-06-08 MED ORDER — MEPERIDINE HCL 50 MG/ML IJ SOLN: 6.2500 mg | INTRAMUSCULAR | Status: DC | PRN

## 2017-06-08 MED ORDER — ONDANSETRON HCL 4 MG/2ML IJ SOLN
INTRAMUSCULAR | Status: DC | PRN
  Administered 2017-06-08: 4 mg via INTRAVENOUS

## 2017-06-08 MED ORDER — PROMETHAZINE HCL 25 MG/ML IJ SOLN: 6.2500 mg | INTRAMUSCULAR | Status: DC | PRN

## 2017-06-08 MED ORDER — FENTANYL CITRATE (PF) 250 MCG/5ML IJ SOLN
INTRAMUSCULAR | Status: AC
  Filled 2017-06-08: qty 5

## 2017-06-08 MED ORDER — PROPOFOL 10 MG/ML IV BOLUS
INTRAVENOUS | Status: DC | PRN
  Administered 2017-06-08: 30 mg via INTRAVENOUS
  Administered 2017-06-08: 50 mg via INTRAVENOUS
  Administered 2017-06-08: 20 mg via INTRAVENOUS
  Administered 2017-06-08 (×2): 30 mg via INTRAVENOUS

## 2017-06-08 MED ORDER — ONDANSETRON HCL 4 MG/2ML IJ SOLN
INTRAMUSCULAR | Status: AC
  Filled 2017-06-08: qty 2

## 2017-06-08 MED ORDER — DEXAMETHASONE SODIUM PHOSPHATE 10 MG/ML IJ SOLN
INTRAMUSCULAR | Status: DC | PRN
  Administered 2017-06-08: 10 mg via INTRAVENOUS

## 2017-06-08 MED ORDER — POTASSIUM CHLORIDE CRYS ER 20 MEQ PO TBCR: 40.0000 meq | EXTENDED_RELEASE_TABLET | Freq: Two times a day (BID) | ORAL | Status: DC

## 2017-06-08 MED ORDER — HYDROMORPHONE HCL 2 MG/ML IJ SOLN: 0.2500 mg | INTRAMUSCULAR | Status: DC | PRN

## 2017-06-08 MED ORDER — LIDOCAINE 2% (20 MG/ML) 5 ML SYRINGE
INTRAMUSCULAR | Status: DC | PRN
  Administered 2017-06-08: 60 mg via INTRAVENOUS
  Administered 2017-06-08: 40 mg via INTRAVENOUS

## 2017-06-08 MED ORDER — PROPOFOL 10 MG/ML IV BOLUS
INTRAVENOUS | Status: AC
  Filled 2017-06-08: qty 20

## 2017-06-08 MED ORDER — MIDAZOLAM HCL 2 MG/2ML IJ SOLN
INTRAMUSCULAR | Status: AC
  Filled 2017-06-08: qty 2

## 2017-06-08 MED ORDER — LIDOCAINE 2% (20 MG/ML) 5 ML SYRINGE
INTRAMUSCULAR | Status: AC
  Filled 2017-06-08: qty 5

## 2017-06-08 MED ORDER — MIDAZOLAM HCL 5 MG/5ML IJ SOLN
INTRAMUSCULAR | Status: DC | PRN
  Administered 2017-06-08: 1 mg via INTRAVENOUS

## 2017-06-08 MED ORDER — EPINEPHRINE 30 MG/30ML IJ SOLN
INTRAMUSCULAR | Status: DC | PRN
  Administered 2017-06-08: 1 mg

## 2017-06-08 MED ORDER — LACTATED RINGERS IV SOLN
INTRAVENOUS | Status: DC
  Administered 2017-06-08 – 2017-06-09 (×2): via INTRAVENOUS

## 2017-06-08 MED ORDER — PHENYLEPHRINE 40 MCG/ML (10ML) SYRINGE FOR IV PUSH (FOR BLOOD PRESSURE SUPPORT)
PREFILLED_SYRINGE | INTRAVENOUS | Status: DC | PRN
  Administered 2017-06-08: 160 ug via INTRAVENOUS
  Administered 2017-06-08: 80 ug via INTRAVENOUS
  Administered 2017-06-08: 40 ug via INTRAVENOUS
  Administered 2017-06-08: 80 ug via INTRAVENOUS

## 2017-06-08 MED ORDER — DEXAMETHASONE SODIUM PHOSPHATE 10 MG/ML IJ SOLN
INTRAMUSCULAR | Status: AC
  Filled 2017-06-08: qty 1

## 2017-06-08 MED ORDER — HYDROCODONE-ACETAMINOPHEN 7.5-325 MG PO TABS: 1.0000 | ORAL_TABLET | Freq: Once | ORAL | Status: DC | PRN

## 2017-06-08 SURGICAL SUPPLY — 20 items
BALLN PULM 15 16.5 18X75 (BALLOONS)
BALLOON PULM 15 16.5 18X75 (BALLOONS) IMPLANT
CANISTER SUCT 3000ML PPV (MISCELLANEOUS) ×2 IMPLANT
CONT SPEC 4OZ CLIKSEAL STRL BL (MISCELLANEOUS) IMPLANT
COVER BACK TABLE 60X90IN (DRAPES) ×2 IMPLANT
COVER MAYO STAND STRL (DRAPES) ×2 IMPLANT
DRAPE HALF SHEET 40X57 (DRAPES) ×2 IMPLANT
GAUZE SPONGE 4X4 16PLY XRAY LF (GAUZE/BANDAGES/DRESSINGS) ×2 IMPLANT
GLOVE BIO SURGEON STRL SZ 6.5 (GLOVE) ×2 IMPLANT
GUARD TEETH (MISCELLANEOUS) IMPLANT
KIT BASIN OR (CUSTOM PROCEDURE TRAY) ×2 IMPLANT
KIT TURNOVER KIT B (KITS) IMPLANT
NS IRRIG 1000ML POUR BTL (IV SOLUTION) ×2 IMPLANT
PAD ARMBOARD 7.5X6 YLW CONV (MISCELLANEOUS) ×4 IMPLANT
PATTIES SURGICAL .5X1.5 (GAUZE/BANDAGES/DRESSINGS) ×2 IMPLANT
SOLUTION ANTI FOG 6CC (MISCELLANEOUS) ×2 IMPLANT
SURGILUBE 2OZ TUBE FLIPTOP (MISCELLANEOUS) IMPLANT
TOWEL NATURAL 6PK STERILE (DISPOSABLE) ×2 IMPLANT
TUBE CONNECTING 12X1/4 (SUCTIONS) ×2 IMPLANT
WATER STERILE IRR 1000ML POUR (IV SOLUTION) ×2 IMPLANT

## 2017-06-08 NOTE — Progress Notes (Signed)
PT Cancellation Note  Patient Details Name: Katelyn [name] MRN: [id number] DOB: 01-12-1951   Cancelled Treatment:    Reason Eval/Treat Not Completed: (P) Patient declined, no reason specified(Pt back from OR s/p biopsy of epiglotis mass.  Pt refused OOB and therapeutic exercises.  Pt requesting H20 at this time and educated she cannot have water but she could dip sponge in ice water and swab her mouth.  RN informed and aware. ) Will follow up per POC.    Pegeen Stiger Eli Hose 06/08/2017, 3:24 PM Governor Rooks, PTA pager [phone]

## 2017-06-08 NOTE — Anesthesia Postprocedure Evaluation (Signed)
Anesthesia Post Note  Patient: Katelyn [name]  Procedure(s) Performed: DIRECT LARYNGOSCOPY WITH BIOPSY (N/A )     Patient location during evaluation: PACU Anesthesia Type: General Level of consciousness: awake and alert Pain management: pain level controlled Vital Signs Assessment: post-procedure vital signs reviewed and stable Respiratory status: spontaneous breathing, nonlabored ventilation, respiratory function stable and patient connected to nasal cannula oxygen Cardiovascular status: blood pressure returned to baseline and stable Postop Assessment: no apparent nausea or vomiting Anesthetic complications: no    Last Vitals:  Vitals:   06/08/17 1418 06/08/17 1426  BP: (!) 158/108   Pulse: 97   Resp: 18   Temp:  36.5 C  SpO2: 97%     Last Pain:  Vitals:   06/08/17 1426  TempSrc:   PainSc: 0-No pain                 Barnet Glasgow

## 2017-06-08 NOTE — Progress Notes (Signed)
Break in skin where trach sits on skin. Noticed while providing trach care and cleaning trach. Sterile water applied to area and clean dressing applied after trach care.

## 2017-06-08 NOTE — Plan of Care (Signed)
  Problem: Clinical Measurements: Goal: Postoperative complications will be avoided or minimized Outcome: Progressing   Problem: Skin Integrity: Goal: Demonstration of wound healing without infection will improve Outcome: Progressing   

## 2017-06-08 NOTE — Final Progress Note (Signed)
Otolaryngology Plan of Care Note  Taken to the operating room today for biopsy. Will refer to Head and Neck Surgery at [facility] for further management.  Will also refer to Rad Onc and Heme Onc.  These things should not affect her discharge. Discussed this with the patient's granddaughter, [name], today who is in agreement with plan.   Helayne Seminole, MD   [facility] Provider Office [phone]

## 2017-06-08 NOTE — Progress Notes (Signed)
Per Dr. Kennon Holter hold heparin due to biopsy today.

## 2017-06-08 NOTE — Progress Notes (Signed)
Patient ID: Katelyn [name], female   DOB: February 25, 1950, 67 y.o.   MRN: [id number]  PROGRESS NOTE    Katelyn [name]  EYE:[id number] DOB: 08-11-1950 DOA: 05/29/2017 PCP: Aretta Nip, MD   Brief Narrative:  67 year old female with history of stroke with residual right-sided weakness, DVT not on anticoagulation, hypertension, gallstone pancolitis, tobacco use, epiglottis lesion present on on 05/29/2017 with right hip pain after a fall.  She was found to have right hip fracture.  Orthopedics was consulted.  Patient underwent surgical intervention by orthopedics.  Postprocedure, patient was obtunded and hypoxic and patient ended up having a tracheostomy because of epiglottic mass.  She was transferred to ICU for further care.  ENT was consulted for the epiglottic mass.  Her condition has stabilized and she was transferred to [facility] care from 06/04/2017.  Assessment & Plan:   Principal Problem:   Closed displaced intertrochanteric fracture of right femur ([place]) Active Problems:   Hypercholesteremia   Essential hypertension   Ataxia following cerebral infarction   CKD (chronic kidney disease) stage 3, GFR 30-59 ml/min (HCC)   Diastolic dysfunction   Tobacco abuse   Anemia   Lesion of epiglottis   Dysphagia   Weight loss   Difficult airway for intubation   Pressure injury of skin   Malnutrition of moderate degree   Acute hypoxic respiratory failure secondary to epiglottic mass status post tracheostomy -Off ventilator.  Now with trach collar.  Follow further recommendations from pulmonary -Currently on 4 L oxygen -Diuretic plan as below  Right hip fracture status post fall -Status post surgical intervention by orthopedics on 05/29/2017 -Wound care as per orthopedics recommendations. -Fall precautions -PT/OT evaluation -Continue pain management  Chronic diastolic heart failure -Currently compensated.   -Strict input and output.  Daily weights.  Positive balance of 2525 .8 mL since  admission.    -Diuresing well.  Continue intravenous Lasix  Leukocytosis -Probably reactive.  Resolved.  Repeat a.m. Labs  Epiglottic mass -ENT following.  Plan for biopsy today  Chronic kidney disease stage III -Creatinine stable.  Monitor  Anemia -Probably secondary to anemia of chronic disease.  Hemoglobin stable.  History of stroke and DVT -Patient not on anticoagulation.  Outpatient follow-up with primary care provider  Tobacco abuse -We will continue to have cessation counseling.  Noncompliance -Patient will have to be more compliant  Hypertension -Blood pressure stable.  Noncompliant to meds as an outpatient.  Continue Lasix.  Generalized deconditioning -PT/OT eval.  Will need probable nursing home placement  Hypokalemia -Improved  Hypomagnesemia -Improved   DVT prophylaxis: Heparin Code Status: Full  family Communication: Spoke to fiancee at bedside Disposition Plan: Nursing home once cleared by ENT  Consultants: PCCM/General surgery/ENT  Procedures:  Tracheostomy on 05/29/2017 Right hip surgery with intramedullary implant on 05/29/2017  Antimicrobials:  None   Subjective: Patient seen and examined at bedside.  She is awake.  She cannot talk because of tracheostomy.  She communicates by nodding her head.  Spoke to fiancee at bedside.  No overnight fever, nausea or vomiting reported.    Objective: Vitals:   06/07/17 2010 06/07/17 2341 06/08/17 0329 06/08/17 0440  BP: 133/81   123/77  Pulse: 72   62  Resp: 20   16  Temp: 97.9 F (36.6 C)   97.7 F (36.5 C)  TempSrc: Oral   Oral  SpO2: 100% 96% 98% 100%  Weight: 40 kg (88 lb 2.9 oz)   39 kg (85 lb 15.7 oz)  Height:  Intake/Output Summary (Last 24 hours) at 06/08/2017 1255 Last data filed at 06/08/2017 0449 Gross per 24 hour  Intake 180 ml  Output 1350 ml  Net -1170 ml   Filed Weights   06/05/17 0621 06/07/17 2010 06/08/17 0440  Weight: 43.3 kg (95 lb 7.4 oz) 40 kg (88 lb 2.9 oz) 39  kg (85 lb 15.7 oz)    Examination:  General exam: No distress.  Awake.  Tracheostomy in place  respiratory system: Bilateral decreased breath sounds at bases  cardiovascular system: Rate controlled, S1-S2 positive  gastrointestinal system: Abdomen is nondistended, soft and nontender. Normal bowel sounds heard. Extremities: No cyanosis; trace edema      Data Reviewed: I have personally reviewed following labs and imaging studies  CBC: Recent Labs  Lab 06/02/17 0308 06/05/17 0634 06/06/17 0520  WBC 5.8 8.2 8.4  NEUTROABS 3.7 5.0 5.0  HGB 9.1* 10.1* 10.2*  HCT 29.6* 32.8* 32.2*  MCV 85.1 85.4 84.5  PLT 212 264 143   Basic Metabolic Panel: Recent Labs  Lab 06/02/17 0308 06/05/17 0634 06/06/17 0520 06/07/17 0719 06/08/17 0636  NA 136 138 139 137 134*  K 3.8 3.6 3.3* 4.8 4.5  CL 102 97* 93* 90* 87*  CO2 27 32 37* 35* 37*  GLUCOSE 92 86 84 93 83  BUN _0 CREATININE 0.68 0.74 0.81 0.90 0.97  CALCIUM 8.8* 8.4* 8.7* 8.9 8.7*  MG  --  1.4* 1.5* 2.0 2.0   GFR: Estimated Creatinine Clearance: 30.2 mL/min (by C-G formula based on SCr of 0.97 mg/dL). Liver Function Tests: No results for input(s): AST, ALT, ALKPHOS, BILITOT, PROT, ALBUMIN in the last 168 hours. No results for input(s): LIPASE, AMYLASE in the last 168 hours. No results for input(s): AMMONIA in the last 168 hours. Coagulation Profile: No results for input(s): INR, PROTIME in the last 168 hours. Cardiac Enzymes: No results for input(s): CKTOTAL, CKMB, CKMBINDEX, TROPONINI in the last 168 hours. BNP (last 3 results) No results for input(s): PROBNP in the last 8760 hours. HbA1C: No results for input(s): HGBA1C in the last 72 hours. CBG: No results for input(s): GLUCAP in the last 168 hours. Lipid Profile: No results for input(s): CHOL, HDL, LDLCALC, TRIG, CHOLHDL, LDLDIRECT in the last 72 hours. Thyroid Function Tests: No results for input(s): TSH, T4TOTAL, FREET4, T3FREE, THYROIDAB in the last  72 hours. Anemia Panel: No results for input(s): VITAMINB12, FOLATE, FERRITIN, TIBC, IRON, RETICCTPCT in the last 72 hours. Sepsis Labs: No results for input(s): PROCALCITON, LATICACIDVEN in the last 168 hours.  Recent Results (from the past 240 hour(s))  MRSA PCR Screening     Status: None   Collection Time: 05/29/17  9:02 PM  Result Value Ref Range Status   MRSA by PCR NEGATIVE NEGATIVE Final    Comment:        The GeneXpert MRSA Assay (FDA approved for NASAL specimens only), is one component of a comprehensive MRSA colonization surveillance program. It is not intended to diagnose MRSA infection nor to guide or monitor treatment for MRSA infections. Performed at [facility], [place] [address] [place], [place] [address]          Radiology Studies: No results found.      Scheduled Meds: . [MAR Hold] chlorhexidine gluconate (MEDLINE KIT)  15 mL Mouth Rinse BID  . [MAR Hold] feeding supplement (ENSURE ENLIVE)  237 mL Oral BID BM  . [MAR Hold] furosemide  40 mg Intravenous Q12H  . [  MAR Hold] heparin  5,000 Units Subcutaneous Q8H  . [MAR Hold] pantoprazole sodium  40 mg Per Tube QHS  . [MAR Hold] polyethylene glycol  17 g Per Tube BID   Continuous Infusions: . lactated ringers 10 mL/hr at 06/08/17 1207     LOS: 10 days        Aline August, MD [facility] Pager [phone]  If 7PM-7AM, please contact night-coverage [email or web address] Password [facility] 06/08/2017, 12:55 PM

## 2017-06-08 NOTE — Transfer of Care (Signed)
Immediate Anesthesia Transfer of Care Note  Patient: Katelyn [name]  Procedure(s) Performed: DIRECT LARYNGOSCOPY WITH BIOPSY (N/A )  Patient Location: PACU  Anesthesia Type:General  Level of Consciousness: drowsy  Airway & Oxygen Therapy: Patient Spontanous Breathing and Patient connected to T-piece oxygen  Post-op Assessment: Report given to RN and Post -op Vital signs reviewed and stable  Post vital signs: Reviewed and stable  Last Vitals:  Vitals Value Taken Time  BP 144/99 06/08/2017  1:45 PM  Temp    Pulse 102 06/08/2017  1:47 PM  Resp 14 06/08/2017  1:47 PM  SpO2 98 % 06/08/2017  1:47 PM  Vitals shown include unvalidated device data.  Last Pain:  Vitals:   06/08/17 0800  TempSrc:   PainSc: 0-No pain      Patients Stated Pain Goal: 3 (35/78/97 8478)  Complications: No apparent anesthesia complications

## 2017-06-08 NOTE — H&P (Signed)
  The surgical history remains accurate and without interval change. The condition still exists which makes the procedure necessary. The patient and/or family is aware of their condition and has been informed of the risks and benefits of surgery, as well as alternatives. All parties have elected to proceed with surgery.   Surgical plan: DL biopsy, informed consent obtained

## 2017-06-08 NOTE — Op Note (Signed)
DATE OF PROCEDURE:  06/08/2017    PRE-OPERATIVE DIAGNOSIS:  LARGE EPIGLOTIS MASS    POST-OPERATIVE DIAGNOSIS:  Same    PROCEDURE(S): Direct laryngoscopy with operating telescope with biopsies   SURGEON:  Gavin Pound, MD    ASSISTANT(S):  none    ANESTHESIA:  General endotracheal anesthesia using tracheostomy     ESTIMATED BLOOD LOSS:  10 mL   SPECIMENS:  Epiglottic mass    COMPLICATIONS:  None    OPERATIVE FINDINGS:  The mass was centered at the epiglottis, involving bilateral valleculae and significant involvement of the base of tongue, with extension into post-cricoid and bilateral piriform sinuses, but not involving posterior pharyngeal walls. Could not visualize true or false folds or AE folds well. This is a clinical T4aN0M0 tumor of the hypopharynx.    OPERATIVE DETAILS: The patient was brought to the operating room and placed in the supine position. General anesthesia was induced using the patient's 6 PERC trach, cuff inflated. A timeout was performed. The bed was rotated 90 degrees. Manual palpation and visual inspection was carried out of the oral cavity and oropharynx. A gauze was placed to protect the gums. The Dedo laryngoscope was inserted into the patient's mouth and the larynx was visualized with findings as noted above. Photodocumentation was obtained. Multiple biopsies were obtained with an up-biting forceps. Hemostasis was achieved with epi-soaked pledgets. All instrumentation was then removed. The patient was returned to the care of the anesthesia staff, awakened, and transported to PACU in good condition.

## 2017-06-08 NOTE — Progress Notes (Signed)
OT Cancellation Note  Patient Details Name: Katelyn [name] MRN: [id number] DOB: 03-19-1950   Cancelled Treatment:    Reason Eval/Treat Not Completed: Patient at procedure or test/ unavailable; will follow up as schedule permits and as pt is available.   Lou Cal, OT Pager [phone] 06/08/2017   Raymondo Band 06/08/2017, 1:13 PM

## 2017-06-08 NOTE — Social Work (Addendum)
CSW met with patient and fiancee to discuss SNF offers. Pt unsure at this time and they will discuss. Pt needs to select SNF and then SNF will need to obtain Insurance Auth.  CSW will continue to follow up for disposition.  11:30am: CSW met with patient and fiancee at bedside and they have accepted SNF bed offer from [facility]. CSW f/u with SNF to confirm offer.   CSW will f/u.  Elissa Hefty, LCSW Clinical Social Worker [phone]

## 2017-06-08 NOTE — Anesthesia Preprocedure Evaluation (Signed)
Anesthesia Evaluation  Patient identified by MRN, date of birth, ID band Patient awake    Reviewed: Allergy & Precautions, H&P , NPO status , Patient's Chart, lab work & pertinent test results  Airway Mallampati: II  TM Distance: >3 FB Neck ROM: full   Comment: Upper airway mass noticed on DL 3 yrs ago. Pt not with very unusual voice and intermittently obstructing when awake. Very high grade concern for airway problems.  Dental no notable dental hx.    Pulmonary shortness of breath, former smoker,    Pulmonary exam normal breath sounds clear to auscultation+ rhonchi   + stridor     Cardiovascular Exercise Tolerance: Poor hypertension,  Rhythm:regular Rate:Normal     Neuro/Psych    GI/Hepatic   Endo/Other    Renal/GU      Musculoskeletal   Abdominal   Peds  Hematology   Anesthesia Other Findings   Reproductive/Obstetrics                             Anesthesia Physical Anesthesia Plan  ASA: III  Anesthesia Plan: General   Post-op Pain Management:    Induction: Intravenous  PONV Risk Score and Plan: Treatment may vary due to age or medical condition  Airway Management Planned: Tracheostomy  Additional Equipment:   Intra-op Plan:   Post-operative Plan: Extubation in OR  Informed Consent: I have reviewed the patients History and Physical, chart, labs and discussed the procedure including the risks, benefits and alternatives for the proposed anesthesia with the patient or authorized representative who has indicated his/her understanding and acceptance.   Dental advisory given  Plan Discussed with: CRNA  Anesthesia Plan Comments:         Anesthesia Quick Evaluation                                  Anesthesia Evaluation  Patient identified by MRN, date of birth, ID band Patient awake    Reviewed: Allergy & Precautions, H&P , Patient's Chart, lab work & pertinent  test results  Airway Mallampati: II  TM Distance: >3 FB Neck ROM: full   Comment: Upper airway mass noticed on DL 3 yrs ago. Pt not with very unusual voice and intermittently obstructing when awake. Very high grade concern for airway problems.  Dental no notable dental hx.    Pulmonary former smoker,    Pulmonary exam normal breath sounds clear to auscultation+ rhonchi   + stridor     Cardiovascular Exercise Tolerance: Good hypertension,  Rhythm:regular Rate:Normal     Neuro/Psych    GI/Hepatic   Endo/Other    Renal/GU      Musculoskeletal   Abdominal   Peds  Hematology   Anesthesia Other Findings   Reproductive/Obstetrics                             Anesthesia Physical Anesthesia Plan  ASA: II  Anesthesia Plan: Spinal   Post-op Pain Management:    Induction:   PONV Risk Score and Plan:   Airway Management Planned:   Additional Equipment:   Intra-op Plan:   Post-operative Plan:   Informed Consent: I have reviewed the patients History and Physical, chart, labs and discussed the procedure including the risks, benefits and alternatives for the proposed anesthesia with the patient or authorized representative who has indicated his/her  understanding and acceptance.     Plan Discussed with:   Anesthesia Plan Comments: (No anti-coags; we'll use spinal and avoid sedation/GA related airway emergency Hb low-expect transfusion NPO Discussed with family and then with Dr Stann Mainland; she should be offered f/u on airway mass while hospitalized  Systolic function was at the lower limits of normal. The estimated ejection fraction was in the range of 50% to 55%. Mild diffuse hypokinesis with no identifiable regional variations)       Anesthesia Quick Evaluation                                   Anesthesia Evaluation  Patient identified by MRN, date of birth, ID band Patient awake    Reviewed: Allergy &  Precautions, H&P , Patient's Chart, lab work & pertinent test results  Airway Mallampati: II  TM Distance: >3 FB Neck ROM: full   Comment: Upper airway mass noticed on DL 3 yrs ago. Pt not with very unusual voice and intermittently obstructing when awake. Very high grade concern for airway problems.  Dental no notable dental hx.    Pulmonary former smoker,    Pulmonary exam normal breath sounds clear to auscultation+ rhonchi   + stridor     Cardiovascular Exercise Tolerance: Good hypertension,  Rhythm:regular Rate:Normal     Neuro/Psych    GI/Hepatic   Endo/Other    Renal/GU      Musculoskeletal   Abdominal   Peds  Hematology   Anesthesia Other Findings   Reproductive/Obstetrics                             Anesthesia Physical Anesthesia Plan  ASA: II  Anesthesia Plan: Spinal   Post-op Pain Management:    Induction:   PONV Risk Score and Plan:   Airway Management Planned:   Additional Equipment:   Intra-op Plan:   Post-operative Plan:   Informed Consent: I have reviewed the patients History and Physical, chart, labs and discussed the procedure including the risks, benefits and alternatives for the proposed anesthesia with the patient or authorized representative who has indicated his/her understanding and acceptance.     Plan Discussed with:   Anesthesia Plan Comments: (No anti-coags; we'll use spinal and avoid sedation/GA related airway emergency Hb low-expect transfusion NPO Discussed with family and then with Dr Stann Mainland; she should be offered f/u on airway mass while hospitalized  Systolic function was at the lower limits of normal. The estimated ejection fraction was in the range of 50% to 55%. Mild diffuse hypokinesis with no identifiable regional variations)       Anesthesia Quick Evaluation                                   Anesthesia Evaluation  Patient identified by MRN, date of birth,  ID band Patient awake    Reviewed: Allergy & Precautions, H&P , Patient's Chart, lab work & pertinent test results  Airway Mallampati: II  TM Distance: >3 FB Neck ROM: full   Comment: Upper airway mass noticed on DL 3 yrs ago. Pt not with very unusual voice and intermittently obstructing when awake. Very high grade concern for airway problems.  Dental no notable dental hx.    Pulmonary former smoker,    Pulmonary exam normal breath sounds clear to  auscultation+ rhonchi   + stridor     Cardiovascular Exercise Tolerance: Good hypertension,  Rhythm:regular Rate:Normal     Neuro/Psych    GI/Hepatic   Endo/Other    Renal/GU      Musculoskeletal   Abdominal   Peds  Hematology   Anesthesia Other Findings   Reproductive/Obstetrics                             Anesthesia Physical Anesthesia Plan  ASA: II  Anesthesia Plan: Spinal   Post-op Pain Management:    Induction:   PONV Risk Score and Plan:   Airway Management Planned:   Additional Equipment:   Intra-op Plan:   Post-operative Plan:   Informed Consent: I have reviewed the patients History and Physical, chart, labs and discussed the procedure including the risks, benefits and alternatives for the proposed anesthesia with the patient or authorized representative who has indicated his/her understanding and acceptance.     Plan Discussed with:   Anesthesia Plan Comments: (No anti-coags; we'll use spinal and avoid sedation/GA related airway emergency Hb low-expect transfusion NPO Discussed with family and then with Dr Stann Mainland; she should be offered f/u on airway mass while hospitalized  Systolic function was at the lower limits of normal. The estimated ejection fraction was in the range of 50% to 55%. Mild diffuse hypokinesis with no identifiable regional variations)       Anesthesia Quick Evaluation

## 2017-06-09 ENCOUNTER — Other Ambulatory Visit: Payer: Self-pay | Admitting: *Deleted

## 2017-06-09 ENCOUNTER — Telehealth: Payer: Self-pay | Admitting: *Deleted

## 2017-06-09 ENCOUNTER — Encounter (HOSPITAL_COMMUNITY): Payer: Self-pay | Admitting: Otolaryngology

## 2017-06-09 DIAGNOSIS — C139 Malignant neoplasm of hypopharynx, unspecified: Secondary | ICD-10-CM

## 2017-06-09 LAB — CBC WITH DIFFERENTIAL/PLATELET
Basophils Absolute: 0 10*3/uL (ref 0.0–0.1)
Basophils Relative: 0 %
Eosinophils Absolute: 0 10*3/uL (ref 0.0–0.7)
Eosinophils Relative: 0 %
HCT: 32.8 % — ABNORMAL LOW (ref 36.0–46.0)
Hemoglobin: 10.3 g/dL — ABNORMAL LOW (ref 12.0–15.0)
Lymphocytes Relative: 14 %
Lymphs Abs: 1.4 10*3/uL (ref 0.7–4.0)
MCH: 26.5 pg (ref 26.0–34.0)
MCHC: 31.4 g/dL (ref 30.0–36.0)
MCV: 84.5 fL (ref 78.0–100.0)
Monocytes Absolute: 0.6 10*3/uL (ref 0.1–1.0)
Monocytes Relative: 6 %
Neutro Abs: 8.4 10*3/uL — ABNORMAL HIGH (ref 1.7–7.7)
Neutrophils Relative %: 80 %
Platelets: 382 10*3/uL (ref 150–400)
RBC: 3.88 MIL/uL (ref 3.87–5.11)
RDW: 19.1 % — ABNORMAL HIGH (ref 11.5–15.5)
WBC: 10.4 10*3/uL (ref 4.0–10.5)

## 2017-06-09 LAB — BASIC METABOLIC PANEL
Anion gap: 15 (ref 5–15)
BUN: 29 mg/dL — ABNORMAL HIGH (ref 6–20)
CO2: 33 mmol/L — ABNORMAL HIGH (ref 22–32)
Calcium: 8.7 mg/dL — ABNORMAL LOW (ref 8.9–10.3)
Chloride: 87 mmol/L — ABNORMAL LOW (ref 101–111)
Creatinine, Ser: 1.41 mg/dL — ABNORMAL HIGH (ref 0.44–1.00)
GFR calc Af Amer: 44 mL/min — ABNORMAL LOW (ref >60)
GFR calc non Af Amer: 38 mL/min — ABNORMAL LOW (ref >60)
Glucose, Bld: 118 mg/dL — ABNORMAL HIGH (ref 65–99)
Potassium: 4.4 mmol/L (ref 3.5–5.1)
Sodium: 135 mmol/L (ref 135–145)

## 2017-06-09 LAB — MAGNESIUM: Magnesium: 2.1 mg/dL (ref 1.7–2.4)

## 2017-06-09 NOTE — Progress Notes (Signed)
Nutrition Follow-up  DOCUMENTATION CODES:   Non-severe (moderate) malnutrition in context of chronic illness  INTERVENTION:    Continue Dysphagia 3, thin liquid diet  NUTRITION DIAGNOSIS:   Moderate Malnutrition related to chronic illness(esophageal lesion with associated restricted PO intakes) as evidenced by mild fat depletion, mild muscle depletion, ongoing  GOAL:   Patient will meet greater than or equal to 90% of their needs, progressing  MONITOR:   PO intake, Supplement acceptance, Labs, Skin, Weight trends, I & O's  ASSESSMENT:   67 year old female with PMH of CVA, DVT, HTN, epiglottic lesion. At baseline she uses Pampers, requires assistance feeding and has chronic right-sided weakness secondary to CVA, non-compliant with walker. She presented to the ED on 4/5 with R hip pain s/p fall and was subsequently taken to the OR for Intramedullary Nail Intertrochanteric Femur. She tolerated procedure well and was taken to PACU. In PACU patient was noted to be obtunded and hypoxic with saturation 70-80 despite supplemental oxygen; bagging attempted. Taken back to the OR for intubation due to epiglottic mass. Unable to place ETT so tracheostomy was placed.   Pt s/p procedures 4/5: INTRAMEDULLARY (IM) NAIL INTERTROCHANTRIC FEMUR (Right)  EMERGENT TRACHEOSTOMY  Pt on trach collar. Resting in bed upon RD visit.  S/p MBSS 4/8. SLP rec Dysphagia 3, thin liquids. PO intake variable at 50-75% per flowsheet records.  Nodded she does not like Ensure Enlive supplements. S/p direct laryngoscopy with biopsy 4/15. Labs and medications reviewed.  Diet Order:  DIET DYS 3 Room service appropriate? Yes; Fluid consistency: Thin  EDUCATION NEEDS:   No education needs have been identified at this time  Skin:  Skin Assessment: Skin Integrity Issues: Skin Integrity Issues:: Stage II, Incisions Stage II: sacrum Incisions: R hip and neck   Last BM:  4/14  Height:   Ht Readings from Last 1  Encounters:  06/04/17 4\' 7"  (1.397 m)    Weight:   Wt Readings from Last 1 Encounters:  06/09/17 92 lb 9.5 oz (42 kg)    Ideal Body Weight:  40 kg  BMI:  Body mass index is 21.52 kg/m.  Estimated Nutritional Needs:   Kcal:  1300-1500  Protein:  60-75 grams  Fluid:  >/= 1.5 L/day   Arthur Holms, RD, LDN Pager #: [phone] After-Hours Pager #: [phone]

## 2017-06-09 NOTE — Progress Notes (Signed)
  Speech Language Pathology Treatment: (aug comm)  Patient Details Name: Katelyn [name] MRN: [id number] DOB: 17-Oct-1950 Today's Date: 06/09/2017 Time: 3557-3220 SLP Time Calculation (min) (ACUTE ONLY): 23 min  Assessment / Plan / Recommendation Clinical Impression  Pt continues to be unable to access upper airway with PMV; immediate backflow through trach notable during brief occlusion. Not safe for PMV.  Continues with cuffed #6 trach.  Pt does demonstrate much improved writing, which is legible and effective for basic communication.    Will need to have trach changed to cuffless prior to D/C to SNF.  D/W RN, SW, and Dr. Starla Link.  I will contact Dr. Blenda Nicely, ENT, re: above.    HPI HPI: 46yoF with hx CVA, DVT (2016), HTN, Anemia, Gallstone pancreatitis, and Epiglottis mass, who presented with a hip fracture, now s/p operative repair. Intubated for procedure; postop patient developed respiratory distress and required emergent trach.       SLP Plan  Continue with current plan of care       Recommendations         Patient may use Passy-Muir Speech Valve: with SLP only PMSV Supervision: Full         Plan: Continue with current plan of care       GO                Juan Quam Laurice 06/09/2017, 10:43 AM

## 2017-06-09 NOTE — Social Work (Signed)
CSW was advised by SNF [facility] that they will not be able to take patient if she will will ned 6 liters of oxygen. CSW f/u as patient will need to be uncuffed and then they will determine what her oxygen will be at that time and/or how well she does.  [facility] SNF is still determining if they can offer a SNF bed. Pt has minimal options at this time as many of the SNF's are not available: [place], [place], [facility].  CSW will continue to follow as a SNF will need to offer a SNF bed and obtain Insurance Auth.  Elissa Hefty, LCSW Clinical Social Worker [phone]

## 2017-06-09 NOTE — Social Work (Signed)
CSW f/u with SNF [facility] and they have offered a SNF bed. CSW discussed with pt and sister at bedside.  CSW then called daughter [name] and advised that [facility] is the only SNF that has offered a SNF bed. Pt has accepted SNF bed from [place].  CSW then called admission staff at [place] [facility] [employer] [facility].  Elissa Hefty, LCSW Clinical Social Worker [phone]

## 2017-06-09 NOTE — Progress Notes (Signed)
Patient ID: Katelyn [name], female   DOB: April 04, 1950, 67 y.o.   MRN: [id number]  PROGRESS NOTE    Katelyn [name]  SHF:[id number] DOB: 03-May-1950 DOA: 05/29/2017 PCP: Aretta Nip, MD   Brief Narrative:  67 year old female with history of stroke with residual right-sided weakness, DVT not on anticoagulation, hypertension, gallstone pancolitis, tobacco use, epiglottis lesion present on on 05/29/2017 with right hip pain after a fall.  She was found to have right hip fracture.  Orthopedics was consulted.  Patient underwent surgical intervention by orthopedics.  Postprocedure, patient was obtunded and hypoxic and patient ended up having a tracheostomy because of epiglottic mass.  She was transferred to ICU for further care.  ENT was consulted for the epiglottic mass.  Her condition has stabilized and she was transferred to [facility] care from 06/04/2017.  Assessment & Plan:   Principal Problem:   Closed displaced intertrochanteric fracture of right femur ([place]) Active Problems:   Hypercholesteremia   Essential hypertension   Ataxia following cerebral infarction   CKD (chronic kidney disease) stage 3, GFR 30-59 ml/min (HCC)   Diastolic dysfunction   Tobacco abuse   Anemia   Lesion of epiglottis   Dysphagia   Weight loss   Difficult airway for intubation   Pressure injury of skin   Malnutrition of moderate degree   Acute hypoxic respiratory failure secondary to epiglottic mass status post tracheostomy -Off ventilator.  Now with trach collar.  Follow further recommendations from pulmonary/ENT regarding trach care. -Currently on 6 L oxygen  Right hip fracture status post fall -Status post surgical intervention by orthopedics on 05/29/2017 -Wound care as per orthopedics recommendations. -Fall precautions -PT/OT evaluation -Continue pain management  Chronic diastolic heart failure -Currently compensated.   -Strict input and output.  Daily weights.  Positive balance of 2120 .8 mL since  admission.    -Diuresing well.  Discontinue Lasix as there is no evidence of fluid overload.  Leukocytosis -Probably reactive.  Resolved.  Repeat a.m. Labs  Epiglottic mass -ENT following.  Status post biopsy on 06/08/2017.  Outpatient follow-up with ENT/rad onc and Hemonc  Chronic kidney disease stage III -Creatinine slightly elevated today.  Repeat a.m. labs.  Monitor  Anemia -Probably secondary to anemia of chronic disease.  Hemoglobin stable.  History of stroke and DVT -Patient not on anticoagulation.  Outpatient follow-up with primary care provider  Tobacco abuse -We will continue to have cessation counseling.  Noncompliance -Patient will have to be more compliant  Hypertension -Blood pressure stable.  Noncompliant to meds as an outpatient.  Continue Lasix.  Generalized deconditioning -We will need PT at nursing home  Hypokalemia -Improved  Hypomagnesemia -Improved   DVT prophylaxis: Heparin Code Status: Full  family Communication: None at bedside Disposition Plan: Nursing home once bed is available  Consultants: PCCM/General surgery/ENT  Procedures:  Tracheostomy on 05/29/2017 Right hip surgery with intramedullary implant on 05/29/2017  Direct laryngoscopy with biopsy of epiglottic mass on 06/08/2017  Antimicrobials:  None   Subjective: Patient seen and examined at bedside.  She is awake.  She cannot talk because of tracheostomy.  She communicates by nodding her head.  No overnight fever, nausea, vomiting or worsening shortness of breath. Objective: Vitals:   06/09/17 0328 06/09/17 0500 06/09/17 0600 06/09/17 0822  BP:   128/88   Pulse: (!) 59  70 90  Resp: _0 Temp:   98.2 F (36.8 C)   TempSrc:   Oral   SpO2: 100%  98% 100%  Weight:  42 kg (92 lb 9.5 oz)    Height:        Intake/Output Summary (Last 24 hours) at 06/09/2017 1211 Last data filed at 06/09/2017 0910 Gross per 24 hour  Intake 400 ml  Output 805 ml  Net -405 ml   Filed  Weights   06/07/17 2010 06/08/17 0440 06/09/17 0500  Weight: 40 kg (88 lb 2.9 oz) 39 kg (85 lb 15.7 oz) 42 kg (92 lb 9.5 oz)    Examination:  General exam: No acute distress.  Tracheostomy in place  respiratory system: Bilateral decreased breath sounds at bases with some scattered crackles cardiovascular system: Rate controlled, S1-S2 positive  gastrointestinal system: Abdomen is nondistended, soft and nontender. Normal bowel sounds heard. Extremities: No cyanosis; trace edema      Data Reviewed: I have personally reviewed following labs and imaging studies  CBC: Recent Labs  Lab 06/05/17 0634 06/06/17 0520 06/09/17 0538  WBC 8.2 8.4 10.4  NEUTROABS 5.0 5.0 8.4*  HGB 10.1* 10.2* 10.3*  HCT 32.8* 32.2* 32.8*  MCV 85.4 84.5 84.5  PLT 264 273 998   Basic Metabolic Panel: Recent Labs  Lab 06/05/17 0634 06/06/17 0520 06/07/17 0719 06/08/17 0636 06/09/17 0538  NA 138 139 137 134* 135  K 3.6 3.3* 4.8 4.5 4.4  CL 97* 93* 90* 87* 87*  CO2 32 37* 35* 37* 33*  GLUCOSE 86 84 93 83 118*  BUN _0 29*  CREATININE 0.74 0.81 0.90 0.97 1.41*  CALCIUM 8.4* 8.7* 8.9 8.7* 8.7*  MG 1.4* 1.5* 2.0 2.0 2.1   GFR: Estimated Creatinine Clearance: 22.7 mL/min (A) (by C-G formula based on SCr of 1.41 mg/dL (H)). Liver Function Tests: No results for input(s): AST, ALT, ALKPHOS, BILITOT, PROT, ALBUMIN in the last 168 hours. No results for input(s): LIPASE, AMYLASE in the last 168 hours. No results for input(s): AMMONIA in the last 168 hours. Coagulation Profile: No results for input(s): INR, PROTIME in the last 168 hours. Cardiac Enzymes: No results for input(s): CKTOTAL, CKMB, CKMBINDEX, TROPONINI in the last 168 hours. BNP (last 3 results) No results for input(s): PROBNP in the last 8760 hours. HbA1C: No results for input(s): HGBA1C in the last 72 hours. CBG: No results for input(s): GLUCAP in the last 168 hours. Lipid Profile: No results for input(s): CHOL, HDL, LDLCALC,  TRIG, CHOLHDL, LDLDIRECT in the last 72 hours. Thyroid Function Tests: No results for input(s): TSH, T4TOTAL, FREET4, T3FREE, THYROIDAB in the last 72 hours. Anemia Panel: No results for input(s): VITAMINB12, FOLATE, FERRITIN, TIBC, IRON, RETICCTPCT in the last 72 hours. Sepsis Labs: No results for input(s): PROCALCITON, LATICACIDVEN in the last 168 hours.  No results found for this or any previous visit (from the past 240 hour(s)).       Radiology Studies: No results found.      Scheduled Meds: . chlorhexidine gluconate (MEDLINE KIT)  15 mL Mouth Rinse BID  . feeding supplement (ENSURE ENLIVE)  237 mL Oral BID BM  . heparin  5,000 Units Subcutaneous Q8H  . pantoprazole sodium  40 mg Per Tube QHS  . polyethylene glycol  17 g Per Tube BID   Continuous Infusions: . lactated ringers 10 mL/hr at 06/08/17 1207     LOS: 11 days        Aline August, MD [facility] Pager [phone]  If 7PM-7AM, please contact night-coverage [email or web address] Password [facility] 06/09/2017, 12:11 PM

## 2017-06-09 NOTE — Progress Notes (Addendum)
Speech Pathology:  A voicemail was left with Dr. Blenda Nicely, ENT, requesting that trach be changed to cuffless prior to pt's D/C to SNF.    1618: A return call was received from Dr. Trish Mage assistant - their practice was not involved in tracheotomy, only in yesterday's biopsy.  Trauma put in trach emergently.    Hennesy Sobalvarro L. Tivis Ringer, [place] CCC/SLP Pager [phone]

## 2017-06-09 NOTE — Social Work (Addendum)
CSW met with patient at bedside to discuss SNF offers again as [facility] will not have a SNF bed until the end of the week.  Pt indicated that she would accept SNF bed at [facility]. CSW then contacted admission staff at [facility] and they will confirm bed offer. CSW will await for call back.  CSW then met with significant other at bedside as CSW was  Leaving and confirmed SNF bed with him and explained SNF process again. He is in agreement with SNF placement of [facility] if they can offer a bed.  CSW was advised by respiratory that patient is still cuffed and in order to go to SNF, pt will need to be uncuffed. Clinical team is following up with doctor on same.  CSW will continue to follow.  11:24am [facility]F [facility] advised that they do not have a bed as they recently filled the trach beds that they had available.  CSW then called [facility] and they indicated that they do not have any beds. CSW f/u with [place] [facility].  CSW then contacted [facility] and spoke to admission staff who will review, but may not be able to offer a SNF bed.  CSW met with daughter, [name] at bedside and discussed SNF's offer. Daughter wants to know what other SNF's and gave permission to f/u.  CSW will continue to follow.  Elissa Hefty, LCSW Clinical Social Worker [phone]

## 2017-06-09 NOTE — Telephone Encounter (Signed)
Oncology Nurse Navigator Documentation  Called Katelyn [name], [place].  She was unable to speak s/p yesterday's bx, spoke with fiance who placed phone to her ear, and later with dtr Katelyn [name].  Introduced myself, informed her of ENT Dr. Trish Mage referral to [facility] to see RadOnc and MedOnc to discuss tmt options, indicated she will be receiving calls to arrange appts.  Provided dtr contact information who noted she should be contacted to arrange appts.  Katelyn Orem, RN, BSN Head & Neck Oncology Nurse [place] at [place] [phone]

## 2017-06-09 NOTE — Progress Notes (Signed)
Physical Therapy Treatment Patient Details Name: Katelyn [name] MRN: [id number] DOB: 1951/02/03 Today's Date: 06/09/2017    History of Present Illness 67 year old female with PMH of CVA, DVT, HTN, epiglottic lesion. At baseline she uses Pampers, requires assistance feeding and has chronic right-sided weakness secondary to CVA, non-compliant with walker. She presented to the ED on 4/5 with R hip pain s/p fall and was subsequently taken to the OR for Intramedullary Nail Intertrochanteric Femur    PT Comments    Pt performed limited gait training and progression of functional OOB mobility.  Pt remains anxious and required +2 max assistance to advance from bed to chair sitting 6 ft away.  Pt remains safest to d/c to SNF based on current functional mobility.  Will continue effort per POC next session as patient is able to progress.   Follow Up Recommendations  SNF;Supervision/Assistance - 24 hour     Equipment Recommendations  (TBD)    Recommendations for Other Services       Precautions / Restrictions Precautions Precautions: Fall Precaution Comments: watch O2 saturations patient on ATC/trach Restrictions Weight Bearing Restrictions: Yes RLE Weight Bearing: Weight bearing as tolerated    Mobility  Bed Mobility Overal bed mobility: Needs Assistance Bed Mobility: Supine to Sit Rolling: Max assist;+2 for safety/equipment;+2 for physical assistance   Supine to sit: Mod assist;+2 for physical assistance(with Max +2 for scooting to edge of bed.  )     General bed mobility comments: assist for LEs and to elevate trunk into sitting; use of bed pad to scoot hips towards EOB   Transfers Overall transfer level: Needs assistance Equipment used: Rolling walker (2 wheeled) Transfers: Sit to/from [facility] Sit to Stand: Max assist;+2 physical assistance;+2 safety/equipment Stand pivot transfers: Max assist;+2 physical assistance;+2 safety/equipment       General  transfer comment: maxA+2 for sit<>stand at [facility] with multimodal cues for placement of UEs on RW upon standing, completing sit<>stand from EOB and recliner; pt requiring increased time and manual facilitation to advance RLE when taking steps to chair as pt is hesitant to bear weight through LE, assist for sequencing RW use  Ambulation/Gait Ambulation/Gait assistance: Max assist;+2 physical assistance Ambulation Distance (Feet): 6 Feet(steps from bed to chair placed strategically further away to progress gait.  ) Assistive device: Rolling walker (2 wheeled) Gait Pattern/deviations: Step-to pattern;Decreased stance time - right;Decreased step length - right;Decreased stride length;Narrow base of support;Trunk flexed;Leaning posteriorly Gait velocity: decreased.     General Gait Details: pt took a few pivotal steps EOB to recliner; declined attempts of further gait progression.  Pt physically required assistance to advance R LE to sidestep toward chair.     Stairs             Wheelchair Mobility    Modified Rankin (Stroke Patients Only)       Balance Overall balance assessment: History of Falls Sitting-balance support: No upper extremity supported;Feet supported;Single extremity supported Sitting balance-Leahy Scale: Fair Sitting balance - Comments: requires UE support    Standing balance support: Bilateral upper extremity supported Standing balance-Leahy Scale: Poor Standing balance comment:  requires bil UE and external support for balance.                            Cognition Arousal/Alertness: Awake/alert Behavior During Therapy: WFL for tasks assessed/performed;Anxious Overall Cognitive Status: Difficult to assess  General Comments: pt appearing anxious about moving which causes her to be somewhat self-limiting; difficult to fully assess cognition as pt not able to verbalize needs at this time; pt attempting to write  her needs, "pain medicine", however was not able to correctly write the words during this session       Exercises      General Comments        Pertinent Vitals/Pain Pain Assessment: Faces Pain Score: 7  Faces Pain Scale: Hurts even more Pain Location: R hip Pain Descriptors / Indicators: Grimacing;Guarding Pain Intervention(s): Monitored during session;Repositioned;Patient requesting pain meds-RN notified    Home Living                      Prior Function            PT Goals (current goals can now be found in the care plan section) Acute Rehab PT Goals Patient Stated Goal: to go home  Potential to Achieve Goals: Fair Progress towards PT goals: Progressing toward goals    Frequency    Min 5X/week      PT Plan Current plan remains appropriate    Co-evaluation PT/OT/SLP Co-Evaluation/Treatment: Yes Reason for Co-Treatment: Complexity of the patient's impairments (multi-system involvement);Necessary to address cognition/behavior during functional activity;For patient/therapist safety   OT goals addressed during session: ADL's and self-care;Proper use of Adaptive equipment and DME      AM-PAC PT "6 Clicks" Daily Activity  Outcome Measure  Difficulty turning over in bed (including adjusting bedclothes, sheets and blankets)?: Unable Difficulty moving from lying on back to sitting on the side of the bed? : Unable Difficulty sitting down on and standing up from a chair with arms (e.g., wheelchair, bedside commode, etc,.)?: Unable Help needed moving to and from a bed to chair (including a wheelchair)?: A Lot Help needed walking in hospital room?: A Lot Help needed climbing 3-5 steps with a railing? : Total 6 Click Score: 8    End of Session Equipment Utilized During Treatment: Gait belt;Oxygen Activity Tolerance: Patient limited by pain;Other (comment)(remains anxious with mobility) Patient left: in chair;with call bell/phone within reach Nurse  Communication: Mobility status PT Visit Diagnosis: Muscle weakness (generalized) (M62.81);History of falling (Z91.81);Pain;Difficulty in walking, not elsewhere classified (R26.2) Pain - Right/Left: Right Pain - part of body: Hip     Time: 9518-8416 PT Time Calculation (min) (ACUTE ONLY): 29 min  Charges:  $Therapeutic Activity: 8-22 mins                    G Codes:       Governor Rooks, PTA pager [phone]    Cristela Blue 06/09/2017, 5:15 PM

## 2017-06-09 NOTE — Progress Notes (Signed)
Occupational Therapy Treatment Patient Details Name: Katelyn [name] MRN: [id number] DOB: 09-Aug-1950 Today's Date: 06/09/2017    History of present illness 67 year old female with PMH of CVA, DVT, HTN, epiglottic lesion. At baseline she uses Pampers, requires assistance feeding and has chronic right-sided weakness secondary to CVA, non-compliant with walker. She presented to the ED on 4/5 with R hip pain s/p fall and was subsequently taken to the OR for Intramedullary Nail Intertrochanteric Femur   OT comments  Pt making slow progress towards OT goals. Pt completing bed mobility and taking small steps to recliner with MaxA+2 using RW; continues to require MaxA+2 for LB ADLs and pericare while standing at RW, completing simple grooming ADLs seated EOB with setup/minguard assist for static sitting balance using UE support. Pt appearing anxious about moving and bearing weight through RLE, requiring increased time for task completion and encouragement throughout. Feel SNF recommendation remains appropriate at this time. Will continue to follow acutely to progress pt towards established OT goals.   Follow Up Recommendations  SNF    Equipment Recommendations  None recommended by OT          Precautions / Restrictions Precautions Precautions: Fall Precaution Comments: watch O2 saturations patient on ATC/trach Restrictions Weight Bearing Restrictions: Yes RLE Weight Bearing: Weight bearing as tolerated       Mobility Bed Mobility Overal bed mobility: Needs Assistance   Rolling: Max assist;+2 for safety/equipment;+2 for physical assistance         General bed mobility comments: assist for LEs and to elevate trunk into sitting; use of bed pad to scoot hips towards EOB   Transfers Overall transfer level: Needs assistance Equipment used: Rolling walker (2 wheeled) Transfers: Sit to/from [facility] Sit to Stand: Max assist;+2 physical assistance;+2  safety/equipment Stand pivot transfers: Max assist;+2 physical assistance;+2 safety/equipment       General transfer comment: maxA+2 for sit<>stand at [facility] with multimodal cues for placement of UEs on RW upon standing, completing sit<>stand from EOB and recliner; pt requiring increased time and manual facilitation to advance RLE when taking steps to chair as pt is hesitant to bear weight through LE, assist for sequencing RW use    Balance Overall balance assessment: History of Falls Sitting-balance support: No upper extremity supported;Feet supported;Single extremity supported Sitting balance-Leahy Scale: Fair Sitting balance - Comments: requires UE support    Standing balance support: Bilateral upper extremity supported Standing balance-Leahy Scale: Poor Standing balance comment:  requires bil UE and external support for balance.                           ADL either performed or assessed with clinical judgement   ADL Overall ADL's : Needs assistance/impaired     Grooming: Wash/dry face;Set up;Sitting                       Toileting- Clothing Manipulation and Hygiene: Maximal assistance;Sit to/from stand;+2 for physical assistance Toileting - Clothing Manipulation Details (indicate cue type and reason): MaxA for pericare in standing with additional assist for standing balance at RW      Functional mobility during ADLs: Maximal assistance;+2 for safety/equipment;+2 for physical assistance;Rolling walker(for stand pivot )                         Cognition Arousal/Alertness: Awake/alert Behavior During Therapy: [facility] for tasks assessed/performed;Anxious Overall Cognitive Status: Difficult to assess  General Comments: pt appearing anxious about moving which causes her to be somewhat self-limiting; difficult to fully assess cognition as pt not able to verbalize needs at this time; pt attempting to write her needs,  "pain medicine", however was not able to correctly write the words during this session                           Pertinent Vitals/ Pain       Pain Assessment: Faces Faces Pain Scale: Hurts even more Pain Location: R hip Pain Descriptors / Indicators: Grimacing;Guarding Pain Intervention(s): Monitored during session;Repositioned;Patient requesting pain meds-RN notified                                                          Frequency  Min 2X/week        Progress Toward Goals  OT Goals(current goals can now be found in the care plan section)  Progress towards OT goals: Progressing toward goals  Acute Rehab OT Goals Patient Stated Goal: to go home  OT Goal Formulation: With patient Time For Goal Achievement: 06/15/17 Potential to Achieve Goals: Good  Plan Discharge plan remains appropriate    Co-evaluation    PT/OT/SLP Co-Evaluation/Treatment: Yes Reason for Co-Treatment: For patient/therapist safety;To address functional/ADL transfers   OT goals addressed during session: ADL's and self-care;Proper use of Adaptive equipment and DME      AM-PAC PT "6 Clicks" Daily Activity     Outcome Measure   Help from another person eating meals?: A Little Help from another person taking care of personal grooming?: A Little Help from another person toileting, which includes using toliet, bedpan, or urinal?: A Lot Help from another person bathing (including washing, rinsing, drying)?: A Lot Help from another person to put on and taking off regular upper body clothing?: A Lot Help from another person to put on and taking off regular lower body clothing?: Total 6 Click Score: 13    End of Session Equipment Utilized During Treatment: Oxygen;Gait belt;Rolling walker  OT Visit Diagnosis: Unsteadiness on feet (R26.81);Pain Pain - Right/Left: Right Pain - part of body: Hip   Activity Tolerance Patient tolerated treatment well   Patient Left in  chair;with call bell/phone within reach;with family/visitor present   Nurse Communication Mobility status;Patient requests pain meds        Time: 1610-9604 OT Time Calculation (min): 28 min  Charges: OT General Charges $OT Visit: 1 Visit OT Treatments $Self Care/Home Management : 8-22 mins  Lou Cal, OT Pager [phone] 06/09/2017    Katelyn [name] 06/09/2017, 4:48 PM

## 2017-06-10 ENCOUNTER — Encounter: Payer: Self-pay | Admitting: *Deleted

## 2017-06-10 ENCOUNTER — Encounter: Payer: Self-pay | Admitting: Radiation Oncology

## 2017-06-10 ENCOUNTER — Telehealth: Payer: Self-pay | Admitting: *Deleted

## 2017-06-10 LAB — CBC WITH DIFFERENTIAL/PLATELET
BASOS ABS: 0 10*3/uL (ref 0.0–0.1)
BASOS PCT: 0 %
EOS ABS: 0 10*3/uL (ref 0.0–0.7)
EOS PCT: 0 %
HCT: 29.4 % — ABNORMAL LOW (ref 36.0–46.0)
Hemoglobin: 9.2 g/dL — ABNORMAL LOW (ref 12.0–15.0)
Lymphocytes Relative: 14 %
Lymphs Abs: 1.9 10*3/uL (ref 0.7–4.0)
MCH: 27.1 pg (ref 26.0–34.0)
MCHC: 31.3 g/dL (ref 30.0–36.0)
MCV: 86.7 fL (ref 78.0–100.0)
MONO ABS: 0.9 10*3/uL (ref 0.1–1.0)
Monocytes Relative: 6 %
Neutro Abs: 10.7 10*3/uL — ABNORMAL HIGH (ref 1.7–7.7)
Neutrophils Relative %: 80 %
Platelets: 363 10*3/uL (ref 150–400)
RBC: 3.39 MIL/uL — AB (ref 3.87–5.11)
RDW: 19.3 % — AB (ref 11.5–15.5)
WBC: 13.5 10*3/uL — AB (ref 4.0–10.5)

## 2017-06-10 LAB — BASIC METABOLIC PANEL
Anion gap: 10 (ref 5–15)
BUN: 30 mg/dL — ABNORMAL HIGH (ref 6–20)
CO2: 36 mmol/L — ABNORMAL HIGH (ref 22–32)
Calcium: 8.8 mg/dL — ABNORMAL LOW (ref 8.9–10.3)
Chloride: 89 mmol/L — ABNORMAL LOW (ref 101–111)
Creatinine, Ser: 1.21 mg/dL — ABNORMAL HIGH (ref 0.44–1.00)
GFR calc Af Amer: 52 mL/min — ABNORMAL LOW (ref >60)
GFR calc non Af Amer: 45 mL/min — ABNORMAL LOW (ref >60)
Glucose, Bld: 83 mg/dL (ref 65–99)
Potassium: 4.1 mmol/L (ref 3.5–5.1)
Sodium: 135 mmol/L (ref 135–145)

## 2017-06-10 LAB — GLUCOSE, CAPILLARY: Glucose-Capillary: 102 mg/dL — ABNORMAL HIGH (ref 65–99)

## 2017-06-10 LAB — MAGNESIUM: MAGNESIUM: 2.2 mg/dL (ref 1.7–2.4)

## 2017-06-10 NOTE — Progress Notes (Signed)
PROGRESS NOTE  Katelyn [name]  ATF:[id number] DOB: Sep 09, 1950 DOA: 05/29/2017 PCP: Aretta Nip, MD   Brief Narrative: Katelyn [name] is a 67 year old female with history of stroke with residual right-sided weakness, DVT (2016) not on anticoagulation, hypertension, gallstone pancreatitis, tobacco use, epiglottis lesion for which she was lost to follow up who presented 05/29/2017 with right hip pain after a fall, found to have hip fracture, and underwent IM intertrochanteric nail 4/5 by orthopedics, Dr. Stann Mainland. Postprocedure, patient was obtunded and hypoxic and patient ended up having a emergent tracheostomy because of epiglottic mass. She was transferred to ICU for further care, briefly on vent. ENT was consulted for the epiglottic mass, performing direct laryngoscopy with biopsies 4/15. Biopsy has shown invasive squamous cell carcinoma, moderately differentiated. She has continued with tracheostomy and is stable for discharge to SNF.   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    Acute hypoxic respiratory failure secondary to epiglottic mass status post tracheostomy:  - Continue routine trach care, convert to cuffless 4/18.  - Continue supplemental oxygen as needed.   Right hip fracture status post fall: s/p IM nail 05/29/2017.  - Follow up with orthopedics in the next week.  - Continue PT/OT and pain  control.   Chronic diastolic heart failure: Euvolemic.  - Continue to measure I/O, daily weights.   Leukocytosis: Reactive.  Resolved.   Chronic kidney disease stage III:  - Monitor BMP in 1 week.   Anemia of chronic disease.  Hemoglobin stable. - Monitor CBC in 1 week.  History of stroke and DVT:  - Patient not on anticoagulation.  Outpatient follow-up with primary care provider  Tobacco abuse - Cessation counseling provided  Hypertension: Blood pressure stable.  Noncompliant to meds as an outpatient.   - Continue Lasix.  Generalized deconditioning - Needs PT at nursing home  Hypokalemia: Resolved.   Hypomagnesemia: Resolved.   DVT prophylaxis: Heparin Code Status: Full Family Communication: Sister at bedside Disposition Plan: SNF 4/18  Consultants:   PCCM, General surgery, ENT  Procedures:  Tracheostomy on 05/29/2017 Right hip surgery with intramedullary implant on 05/29/2017 Direct laryngoscopy with biopsy of epiglottic mass on 06/08/2017  Antimicrobials:  None   Subjective: Breathing is stable, improved. Pain is controlled in the right leg, but requiring medications. No bleeding/bruising. No fevers, cough, dysuria.   Objective: Vitals:   06/10/17 1123 06/10/17 1138 06/10/17 1320 06/10/17 1446  BP:   116/69   Pulse: (!) 57  78 60  Resp: '16  16 16  ' Temp:   98.3 F (36.8 C)   TempSrc:   Oral   SpO2: 100% 98% 100% 100%  Weight:      Height:        Intake/Output Summary (Last 24 hours) at 06/10/2017 1748 Last data filed at 06/10/2017 0700 Gross per 24 hour  Intake 480 ml  Output 1000 ml  Net -520 ml   Filed Weights   06/08/17 0440  06/09/17 0500 06/10/17 0455  Weight: 39 kg (85 lb 15.7 oz) 42 kg (92 lb 9.5 oz) 36 kg (79 lb 5.9 oz)    Gen: 67 y.o. female in no distress Neck: Tracheostomy site without purulence.  Pulm: Non-labored, decreased bilaterally, no wheezing. CV: Regular rate and rhythm. No murmur, rub, or gallop. No JVD, no pedal  edema. GI: Abdomen soft, non-tender, non-distended, with normoactive bowel sounds. No organomegaly or masses felt. Ext: RLE with lateral dressing c/d/i appropriately tender with soft compartment, distally neurovascularly intact.  Skin: As above, otherwise no rashes/ulcers Neuro: Alert and oriented. No focal neurological deficits. Psych: Judgement and insight appear normal. Mood & affect appropriate.   Data Reviewed: I have personally reviewed following labs and imaging studies  CBC: Recent Labs  Lab 06/05/17 0634 06/06/17 0520 06/09/17 0538 06/10/17 0542  WBC 8.2 8.4 10.4 13.5*  NEUTROABS 5.0 5.0 8.4* 10.7*  HGB 10.1* 10.2* 10.3* 9.2*  HCT 32.8* 32.2* 32.8* 29.4*  MCV 85.4 84.5 84.5 86.7  PLT 264 273 382 208   Basic Metabolic Panel: Recent Labs  Lab 06/06/17 0520 06/07/17 0719 06/08/17 0636 06/09/17 0538 06/10/17 0542  NA 139 137 134* 135 135  K 3.3* 4.8 4.5 4.4 4.1  CL 93* 90* 87* 87* 89*  CO2 37* 35* 37* 33* 36*  GLUCOSE 84 93 83 118* 83  BUN '11 10 18 ' 29* 30*  CREATININE 0.81 0.90 0.97 1.41* 1.21*  CALCIUM 8.7* 8.9 8.7* 8.7* 8.8*  MG 1.5* 2.0 2.0 2.1 2.2   GFR: Estimated Creatinine Clearance: 24.2 mL/min (A) (by C-G formula based on SCr of 1.21 mg/dL (H)). Liver Function Tests: No results for input(s): AST, ALT, ALKPHOS, BILITOT, PROT, ALBUMIN in the last 168 hours. No results for input(s): LIPASE, AMYLASE in the last 168 hours. No results for input(s): AMMONIA in the last 168 hours. Coagulation Profile: No results for input(s): INR, PROTIME in the last 168 hours. Cardiac Enzymes: No results for input(s): CKTOTAL, CKMB, CKMBINDEX, TROPONINI in the last 168 hours. BNP (last 3 results) No results for input(s): PROBNP in the last 8760 hours. HbA1C: No results for input(s): HGBA1C in the last 72 hours. CBG: No results for input(s): GLUCAP in the last 168 hours. Lipid Profile: No results for input(s): CHOL, HDL, LDLCALC, TRIG, CHOLHDL, LDLDIRECT in the last 72  hours. Thyroid Function Tests: No results for input(s): TSH, T4TOTAL, FREET4, T3FREE, THYROIDAB in the last 72 hours. Anemia Panel: No results for input(s): VITAMINB12, FOLATE, FERRITIN, TIBC, IRON, RETICCTPCT in the last 72 hours. Urine analysis:    Component Value Date/Time   COLORURINE AMBER (A) 06/04/2017 1841   APPEARANCEUR CLEAR 06/04/2017 1841   LABSPEC 1.028 06/04/2017 1841   PHURINE 5.0 06/04/2017 1841   GLUCOSEU NEGATIVE 06/04/2017 1841   HGBUR MODERATE (A) 06/04/2017 1841   HGBUR trace-lysed 12/30/2006 0956   BILIRUBINUR SMALL (A) 06/04/2017 1841   KETONESUR NEGATIVE 06/04/2017 1841   PROTEINUR NEGATIVE 06/04/2017 1841   UROBILINOGEN 4.0 (H) 01/07/2014 2323   NITRITE NEGATIVE 06/04/2017 1841   LEUKOCYTESUR SMALL (A) 06/04/2017 1841   No results found for this or any previous visit (from the past 240 hour(s)).    Radiology Studies: No results found.  Scheduled Meds: . chlorhexidine gluconate (MEDLINE KIT)  15 mL Mouth Rinse BID  . heparin  5,000 Units Subcutaneous Q8H  . pantoprazole sodium  40 mg Per Tube QHS  . polyethylene glycol  17 g Per Tube BID   Continuous Infusions: . lactated ringers 10 mL/hr  at 06/09/17 2223     LOS: 12 days   Time spent: 25 minutes.  Vance Gather, MD [facility] Pager [phone]  If 7PM-7AM, please contact night-coverage [email or web address] Password [facility] 06/10/2017, 5:48 PM

## 2017-06-10 NOTE — Telephone Encounter (Signed)
Oncology Nurse Navigator Documentation  Rec'd call from pt dtr.  I answered her questions re radiation and chemo tmt, confirmed her understanding of 4/30 1:30 RadOnc appt, 3:20 MedOnc appt.  She noted she plans to bring her mother.  I encouraged her to call me when her mother's transfer to SNF is completed, currently expected to be [place].  She voiced understanding I will be in contact with [facility] to facilitate transportation for future [place] appts.  [name], RN, BSN Head & Neck Oncology Nurse [place] at [facility] [phone]

## 2017-06-10 NOTE — Progress Notes (Signed)
Physical Therapy Treatment Patient Details Name: Katelyn [name] MRN: [id number] DOB: Mar 08, 1950 Today's Date: 06/10/2017    History of Present Illness 67 year old female with PMH of CVA, DVT, HTN, epiglottic lesion. At baseline she uses Pampers, requires assistance feeding and has chronic right-sided weakness secondary to CVA, non-compliant with walker. She presented to the ED on 4/5 with R hip pain s/p fall and was subsequently taken to the OR for Intramedullary Nail Intertrochanteric Femur    PT Comments    Pt with flat affect but agreeable for bed to chair transfer.  Pt continues to make slow progress and continues to benefit from skilled rehab at SNF to improve strength and function.  Pt is difficult to build rapport based on poor ability to communicate other than head nods (trach).  Plan to reduce frequency to 3x/wk based on d/c disposition and diagnosis.  Will inform supervising PT of need for reduced frequency.    Follow Up Recommendations  SNF;Supervision/Assistance - 24 hour     Equipment Recommendations  (TBD)    Recommendations for Other Services       Precautions / Restrictions Precautions Precautions: Fall Precaution Comments: watch O2 saturations patient on ATC/trach Restrictions Weight Bearing Restrictions: Yes RLE Weight Bearing: Weight bearing as tolerated    Mobility  Bed Mobility Overal bed mobility: Needs Assistance Bed Mobility: Supine to Sit     Supine to sit: +2 for physical assistance;Mod assist     General bed mobility comments: assist for LEs and to elevate trunk into sitting; use of bed pad to scoot hips towards EOB   Transfers Overall transfer level: Needs assistance Equipment used: Rolling walker (2 wheeled)   Sit to Stand: Mod assist;+2 physical assistance         General transfer comment: Pt required cues for hand placement and hand over hand guidance to reach back to return to seated surface.  Pt unable to take steps towards chair  and opted to pivot on L LE and she remains to place weight through RLE.    Ambulation/Gait Ambulation/Gait assistance: (Unable during session.  )               Stairs             Wheelchair Mobility    Modified Rankin (Stroke Patients Only)       Balance Overall balance assessment: History of Falls Sitting-balance support: No upper extremity supported;Feet supported;Single extremity supported Sitting balance-Leahy Scale: Fair       Standing balance-Leahy Scale: Poor                              Cognition Arousal/Alertness: Awake/alert Behavior During Therapy: WFL for tasks assessed/performed;Anxious Overall Cognitive Status: Difficult to assess Area of Impairment: Following commands;Safety/judgement;Problem solving;Awareness;Attention                   Current Attention Level: Sustained   Following Commands: Follows one step commands inconsistently;Follows one step commands with increased time Safety/Judgement: Decreased awareness of safety;Decreased awareness of deficits Awareness: Emergent Problem Solving: Slow processing;Decreased initiation;Difficulty sequencing;Requires verbal cues;Requires tactile cues General Comments: pt appearing anxious about moving which causes her to be somewhat self-limiting; difficult to fully assess cognition as pt not able to verbalize needs at this time      Exercises      General Comments        Pertinent Vitals/Pain Pain Assessment: 0-10 Pain Score: 7  Pain  Location: R hip Pain Descriptors / Indicators: Grimacing;Guarding Pain Intervention(s): Monitored during session;Repositioned    Home Living                      Prior Function            PT Goals (current goals can now be found in the care plan section) Acute Rehab PT Goals Patient Stated Goal: to go home  Potential to Achieve Goals: Fair Progress towards PT goals: Progressing toward goals(slowly required decreased  assistance but performed decreased activity.  )    Frequency    Min 3X/week(reduced to 3x/wk based on diagnosis and d/c disposition.  )      PT Plan Discharge plan needs to be updated    Co-evaluation              AM-PAC PT "6 Clicks" Daily Activity  Outcome Measure  Difficulty turning over in bed (including adjusting bedclothes, sheets and blankets)?: Unable Difficulty moving from lying on back to sitting on the side of the bed? : Unable Difficulty sitting down on and standing up from a chair with arms (e.g., wheelchair, bedside commode, etc,.)?: Unable Help needed moving to and from a bed to chair (including a wheelchair)?: A Lot Help needed walking in hospital room?: A Lot Help needed climbing 3-5 steps with a railing? : Total 6 Click Score: 8    End of Session Equipment Utilized During Treatment: Gait belt;Oxygen Activity Tolerance: Patient limited by pain;Other (comment)(Pt appears depressed and performing less mobility from previous session.  ) Patient left: in chair;with call bell/phone within reach Nurse Communication: Mobility status PT Visit Diagnosis: Muscle weakness (generalized) (M62.81);History of falling (Z91.81);Pain;Difficulty in walking, not elsewhere classified (R26.2) Pain - Right/Left: Right Pain - part of body: Hip     Time: 1152-1206 PT Time Calculation (min) (ACUTE ONLY): 14 min  Charges:  $Therapeutic Activity: 8-22 mins                    G Codes:       Governor Rooks, PTA pager [phone]    Cristela Blue 06/10/2017, 2:44 PM

## 2017-06-10 NOTE — Progress Notes (Signed)
Oncology Nurse Navigator Documentation  Visited Ms. Katelyn [name] in follow-up to my introductory phone call.  Her sister was bedside. I reintroduced myself as her navigator. Reiterated:  Her referral by ENT Dr. Blenda Nicely to see RadOnc and MedOnc at [facility], appts pending.  My role as a member of her Care Team. I acknowledged understanding of her pending DC to SNF, explained I will contact SNF to coordinate upcoming [facility] appts. Provided my contact information, encouraged her/family to contact me with needs/concerns. [name] and her sister voiced understanding of information provided.  [name], RN, BSN Head & Neck Oncology Nurse [place] at [place] [phone]

## 2017-06-10 NOTE — Care Management (Addendum)
Case manager paged Dr. Vilma Meckel. Patient's trach needs to be changed to a cuffless trach , for discharge to SNF. Dr. Bonner Puna will enter order for respiratory to change trach. Case Manager spoke with respiratory therapist and requested that it be done early am to facilitate patient discharge.

## 2017-06-10 NOTE — Social Work (Signed)
CSW was advised by [facility] that they recieved Insurance Auth for SNF placement.  Pt still has not been made cuffless- Trach in order to go to SNF.  Message was sent to doctor.  CSW will continue to follow up.  Elissa Hefty, LCSW Clinical Social Worker [phone]

## 2017-06-11 DIAGNOSIS — Z743 Need for continuous supervision: Secondary | ICD-10-CM | POA: Diagnosis not present

## 2017-06-11 DIAGNOSIS — C320 Malignant neoplasm of glottis: Secondary | ICD-10-CM | POA: Diagnosis not present

## 2017-06-11 DIAGNOSIS — Z87891 Personal history of nicotine dependence: Secondary | ICD-10-CM | POA: Diagnosis not present

## 2017-06-11 DIAGNOSIS — R1310 Dysphagia, unspecified: Secondary | ICD-10-CM | POA: Diagnosis not present

## 2017-06-11 DIAGNOSIS — G464 Cerebellar stroke syndrome: Secondary | ICD-10-CM | POA: Diagnosis not present

## 2017-06-11 DIAGNOSIS — R400 Somnolence: Secondary | ICD-10-CM | POA: Diagnosis not present

## 2017-06-11 DIAGNOSIS — K5909 Other constipation: Secondary | ICD-10-CM | POA: Diagnosis not present

## 2017-06-11 DIAGNOSIS — I10 Essential (primary) hypertension: Secondary | ICD-10-CM

## 2017-06-11 DIAGNOSIS — S72001D Fracture of unspecified part of neck of right femur, subsequent encounter for closed fracture with routine healing: Secondary | ICD-10-CM | POA: Diagnosis not present

## 2017-06-11 DIAGNOSIS — R630 Anorexia: Secondary | ICD-10-CM | POA: Diagnosis not present

## 2017-06-11 DIAGNOSIS — F418 Other specified anxiety disorders: Secondary | ICD-10-CM | POA: Diagnosis not present

## 2017-06-11 DIAGNOSIS — R231 Pallor: Secondary | ICD-10-CM | POA: Diagnosis not present

## 2017-06-11 DIAGNOSIS — Z803 Family history of malignant neoplasm of breast: Secondary | ICD-10-CM | POA: Diagnosis not present

## 2017-06-11 DIAGNOSIS — Z471 Aftercare following joint replacement surgery: Secondary | ICD-10-CM | POA: Diagnosis not present

## 2017-06-11 DIAGNOSIS — I69393 Ataxia following cerebral infarction: Secondary | ICD-10-CM

## 2017-06-11 DIAGNOSIS — Z86718 Personal history of other venous thrombosis and embolism: Secondary | ICD-10-CM | POA: Diagnosis not present

## 2017-06-11 DIAGNOSIS — M25551 Pain in right hip: Secondary | ICD-10-CM | POA: Diagnosis not present

## 2017-06-11 DIAGNOSIS — E440 Moderate protein-calorie malnutrition: Secondary | ICD-10-CM

## 2017-06-11 DIAGNOSIS — D0008 Carcinoma in situ of pharynx: Secondary | ICD-10-CM | POA: Diagnosis not present

## 2017-06-11 DIAGNOSIS — J9601 Acute respiratory failure with hypoxia: Secondary | ICD-10-CM | POA: Diagnosis not present

## 2017-06-11 DIAGNOSIS — F17211 Nicotine dependence, cigarettes, in remission: Secondary | ICD-10-CM | POA: Diagnosis not present

## 2017-06-11 DIAGNOSIS — R5381 Other malaise: Secondary | ICD-10-CM | POA: Diagnosis not present

## 2017-06-11 DIAGNOSIS — M84451A Pathological fracture, right femur, initial encounter for fracture: Secondary | ICD-10-CM | POA: Diagnosis not present

## 2017-06-11 DIAGNOSIS — K449 Diaphragmatic hernia without obstruction or gangrene: Secondary | ICD-10-CM | POA: Diagnosis not present

## 2017-06-11 DIAGNOSIS — Z8719 Personal history of other diseases of the digestive system: Secondary | ICD-10-CM | POA: Diagnosis not present

## 2017-06-11 DIAGNOSIS — Z09 Encounter for follow-up examination after completed treatment for conditions other than malignant neoplasm: Secondary | ICD-10-CM | POA: Diagnosis not present

## 2017-06-11 DIAGNOSIS — S72141D Displaced intertrochanteric fracture of right femur, subsequent encounter for closed fracture with routine healing: Secondary | ICD-10-CM | POA: Diagnosis not present

## 2017-06-11 DIAGNOSIS — Z79899 Other long term (current) drug therapy: Secondary | ICD-10-CM | POA: Diagnosis not present

## 2017-06-11 DIAGNOSIS — R05 Cough: Secondary | ICD-10-CM | POA: Diagnosis not present

## 2017-06-11 DIAGNOSIS — I5032 Chronic diastolic (congestive) heart failure: Secondary | ICD-10-CM | POA: Diagnosis not present

## 2017-06-11 DIAGNOSIS — E7800 Pure hypercholesterolemia, unspecified: Secondary | ICD-10-CM | POA: Diagnosis not present

## 2017-06-11 DIAGNOSIS — J80 Acute respiratory distress syndrome: Secondary | ICD-10-CM | POA: Diagnosis not present

## 2017-06-11 DIAGNOSIS — N183 Chronic kidney disease, stage 3 (moderate): Secondary | ICD-10-CM | POA: Diagnosis not present

## 2017-06-11 DIAGNOSIS — D491 Neoplasm of unspecified behavior of respiratory system: Secondary | ICD-10-CM | POA: Diagnosis not present

## 2017-06-11 DIAGNOSIS — S72141A Displaced intertrochanteric fracture of right femur, initial encounter for closed fracture: Secondary | ICD-10-CM | POA: Diagnosis not present

## 2017-06-11 DIAGNOSIS — Z930 Tracheostomy status: Secondary | ICD-10-CM | POA: Diagnosis not present

## 2017-06-11 DIAGNOSIS — M2550 Pain in unspecified joint: Secondary | ICD-10-CM | POA: Diagnosis not present

## 2017-06-11 DIAGNOSIS — M6281 Muscle weakness (generalized): Secondary | ICD-10-CM | POA: Diagnosis not present

## 2017-06-11 DIAGNOSIS — D649 Anemia, unspecified: Secondary | ICD-10-CM | POA: Diagnosis not present

## 2017-06-11 DIAGNOSIS — I5189 Other ill-defined heart diseases: Secondary | ICD-10-CM

## 2017-06-11 DIAGNOSIS — R1312 Dysphagia, oropharyngeal phase: Secondary | ICD-10-CM | POA: Diagnosis not present

## 2017-06-11 DIAGNOSIS — C321 Malignant neoplasm of supraglottis: Secondary | ICD-10-CM

## 2017-06-11 DIAGNOSIS — Z8673 Personal history of transient ischemic attack (TIA), and cerebral infarction without residual deficits: Secondary | ICD-10-CM | POA: Diagnosis not present

## 2017-06-11 DIAGNOSIS — I700 Atherosclerosis of aorta: Secondary | ICD-10-CM | POA: Diagnosis not present

## 2017-06-11 DIAGNOSIS — M25559 Pain in unspecified hip: Secondary | ICD-10-CM | POA: Diagnosis not present

## 2017-06-11 DIAGNOSIS — J439 Emphysema, unspecified: Secondary | ICD-10-CM | POA: Diagnosis not present

## 2017-06-11 DIAGNOSIS — Z7401 Bed confinement status: Secondary | ICD-10-CM | POA: Diagnosis not present

## 2017-06-11 DIAGNOSIS — R4702 Dysphasia: Secondary | ICD-10-CM | POA: Diagnosis not present

## 2017-06-11 DIAGNOSIS — M549 Dorsalgia, unspecified: Secondary | ICD-10-CM | POA: Diagnosis not present

## 2017-06-11 DIAGNOSIS — F331 Major depressive disorder, recurrent, moderate: Secondary | ICD-10-CM | POA: Diagnosis not present

## 2017-06-11 DIAGNOSIS — C139 Malignant neoplasm of hypopharynx, unspecified: Secondary | ICD-10-CM | POA: Diagnosis present

## 2017-06-11 DIAGNOSIS — J90 Pleural effusion, not elsewhere classified: Secondary | ICD-10-CM | POA: Diagnosis not present

## 2017-06-11 DIAGNOSIS — K5900 Constipation, unspecified: Secondary | ICD-10-CM | POA: Diagnosis not present

## 2017-06-11 DIAGNOSIS — R5383 Other fatigue: Secondary | ICD-10-CM | POA: Diagnosis not present

## 2017-06-11 DIAGNOSIS — I129 Hypertensive chronic kidney disease with stage 1 through stage 4 chronic kidney disease, or unspecified chronic kidney disease: Secondary | ICD-10-CM | POA: Diagnosis not present

## 2017-06-11 DIAGNOSIS — F17210 Nicotine dependence, cigarettes, uncomplicated: Secondary | ICD-10-CM | POA: Diagnosis not present

## 2017-06-11 DIAGNOSIS — J387 Other diseases of larynx: Secondary | ICD-10-CM | POA: Diagnosis not present

## 2017-06-11 DIAGNOSIS — G8911 Acute pain due to trauma: Secondary | ICD-10-CM | POA: Diagnosis not present

## 2017-06-11 DIAGNOSIS — Z720 Tobacco use: Secondary | ICD-10-CM

## 2017-06-11 MED ORDER — HYDROCODONE-ACETAMINOPHEN 5-325 MG PO TABS: 1.0000 | ORAL_TABLET | Freq: Four times a day (QID) | ORAL | 0 refills | Status: DC | PRN

## 2017-06-11 MED ORDER — LORAZEPAM 0.5 MG PO TABS: 0.5000 mg | ORAL_TABLET | Freq: Once | ORAL | Status: AC

## 2017-06-11 MED ORDER — LORAZEPAM 2 MG/ML IJ SOLN
0.5000 mg | Freq: Once | INTRAMUSCULAR | Status: AC
  Administered 2017-06-11: 0.5 mg via INTRAMUSCULAR

## 2017-06-11 MED ORDER — LORAZEPAM 2 MG/ML IJ SOLN
0.5000 mg | Freq: Once | INTRAMUSCULAR | Status: DC
  Filled 2017-06-11: qty 1

## 2017-06-11 NOTE — Progress Notes (Signed)
Report given to RN Gwyndolyn Saxon at [facility].

## 2017-06-11 NOTE — Progress Notes (Signed)
Attempted report x2 at [place]. No one would answer the phone to give report.

## 2017-06-11 NOTE — Social Work (Signed)
CSW met with patient at bedside and SNF is ready for patient if med ready. SNF obtained [facility].  CSW f/u for disposition.  Elissa Hefty, LCSW Clinical Social Worker [phone]

## 2017-06-11 NOTE — Social Work (Signed)
Clinical Social Worker facilitated patient discharge including contacting patient family and facility to confirm patient discharge plans.  Clinical information faxed to facility and family agreeable with plan.    CSW arranged ambulance transport via [facility] to [facility] .    RN to call [phone] to give report prior to discharge.  Clinical Social Worker will sign off for now as social work intervention is no longer needed. Please consult [place] again if new need arises.  Elissa Hefty, LCSW Clinical Social Worker [phone]

## 2017-06-11 NOTE — Clinical Social Work Placement (Signed)
   CLINICAL SOCIAL WORK PLACEMENT  NOTE  Date:  06/11/2017  Patient Details  Name: Katelyn [name] MRN: [id number] Date of Birth: 31-Aug-1950  Clinical Social Work is seeking post-discharge placement for this patient at the [place] level of care (*CSW will initial, date and re-position this form in  chart as items are completed):  Yes   Patient/family provided with [place] Work Department's list of facilities offering this level of care within the geographic area requested by the patient (or if unable, by the patient's family).  Yes   Patient/family informed of their freedom to choose among providers that offer the needed level of care, that participate in Medicare, Medicaid or managed care program needed by the patient, have an available bed and are willing to accept the patient.  Yes   Patient/family informed of [facility]'s ownership interest in [facility] and [facility], as well as of the fact that they are under no obligation to receive care at these facilities.  PASRR submitted to EDS on 06/02/17     PASRR number received on 06/02/17     Existing PASRR number confirmed on       FL2 transmitted to all facilities in geographic area requested by pt/family on 06/02/17     FL2 transmitted to all facilities within larger geographic area on       Patient informed that his/her managed care company has contracts with or will negotiate with certain facilities, including the following:        Yes   Patient/family informed of bed offers received.  Patient chooses bed at [facility]     Physician recommends and patient chooses bed at      Patient to be transferred to [place] on 06/11/17.  Patient to be transferred to facility by [facility]     Patient family notified on 06/11/17 of transfer.  Name of family member notified:  pt responsible for self and sgo [name] at bedside     PHYSICIAN Please sign FL2     Additional Comment:     _______________________________________________ Normajean Baxter, LCSW 06/11/2017, 12:31 PM

## 2017-06-11 NOTE — Procedures (Signed)
Tracheostomy Change Note  Patient Details:   Name: Katelyn [name] DOB: 1950-12-19 MRN: [id number]    Airway Documentation:  Trach tube changed to cuffless trach for discharge without difficulty. Patient had positive color change on CO2 detector. Trach site cleansed and trach tie secured.   Evaluation  O2 sats: stable throughout Complications: No apparent complications Patient did tolerate procedure well. Bilateral Breath Sounds: Rhonchi, Diminished    Ander Purpura 06/11/2017, 7:26 AM

## 2017-06-11 NOTE — Discharge Summary (Signed)
Physician Discharge Summary  Katelyn [name] MWN:[id number] DOB: [birth year] DOA: 05/29/2017  PCP: Aretta Nip, MD  Admit date: 05/29/2017 Discharge date: 06/11/2017  Admitted From: Home Disposition: SNF   Recommendations for Outpatient Follow-up:  1. Follow up with oncology and radiation oncology. Referrals have been made, appts pending. 2. Continue tracheostomy care and education 3. Follow up with orthopedics in 1 week. 4. Check BMP, CBC in 1 week.  Home Health: N/A Equipment/Devices: Per SNF, tracheostomy, supplemental oxygen Discharge Condition: Stable CODE STATUS: Full Diet recommendation: Dysphagia 3  Brief/Interim Summary: Katelyn [name] is a 67 year old female with history of stroke with residual right-sided weakness, DVT (2016) not on anticoagulation, hypertension, gallstone pancreatitis, tobacco use, epiglottis lesion for which she was lost to follow up who presented 05/29/2017 with right hip pain after a fall, found to have hip fracture, and underwent IM intertrochanteric nail 4/5 by orthopedics, Dr. Stann Mainland. Postprocedure, patient was obtunded and hypoxic and patient ended up having a emergent tracheostomy because of epiglottic mass. She was transferred to ICU for further care, briefly on vent. ENT was consulted for the epiglottic mass, performing direct laryngoscopy with biopsies 4/15. Biopsy has shown invasive squamous cell carcinoma, moderately differentiated. She has continued with tracheostomy, converted to cuffless 4/18, and is stable for discharge to SNF.   Discharge Diagnoses:  Principal Problem:   Closed displaced intertrochanteric fracture of right femur ([place]) Active Problems:   Hypercholesteremia   Essential hypertension   Ataxia following cerebral infarction   CKD (chronic kidney disease) stage 3, GFR 30-59 ml/min (HCC)   Diastolic dysfunction   Tobacco abuse   Anemia   Lesion of epiglottis   Dysphagia   Weight loss   Difficult airway for intubation    Pressure injury of skin   Malnutrition of moderate degree  Epiglottic mass, invasive squamous cell carcinoma, moderately differentiated on biopsy 06/08/2017: - Follow up with oncology ([place]), radiation oncology as outpatient.   Per note 4/17:  "Oncology Nurse Navigator Documentation  Visited Katelyn [name] [id number] in follow-up to my introductory phone call.  Her sister was bedside. I reintroduced myself as her navigator. Reiterated:  Her referral by ENT Dr. Blenda Nicely to see RadOnc and MedOnc at [facility], appts pending.  My role as a member of her Care Team. I acknowledged understanding of her pending DC to SNF, explained I will contact SNF to coordinate upcoming [place] appts. Provided my contact information, encouraged her/family to contact me with needs/concerns. Katelyn [name] and her sister voiced understanding of information provided.  [name], RN, BSN Head & Neck Oncology Nurse [place] at [place] [phone]"  Acute hypoxic respiratory failure secondary to epiglottic mass status post tracheostomy:  - Continue routine trach care, converted to cuffless 4/18.  - Continue supplemental oxygen as needed.   Right hip fracture status post fall: s/p IM nail 05/29/2017.  - Follow up with orthopedics in the next week.  - Continue PT/OT and pain control (hydrocodone prn)   Chronic diastolic heart failure: Euvolemic.  - Continue to monitor volume status.  Leukocytosis: Reactive. Resolved.   Chronic kidney disease stage III:  - Monitor BMP in 1 week.   Anemia of chronic disease. Hemoglobin stable. - Monitor CBC in 1 week.  History of stroke and DVT:  - Patient not on anticoagulation. Outpatient follow-up with primary care provider  Tobacco abuse - Cessation counseling provided  Hypertension: Blood pressure stable. Noncompliant to meds as an outpatient.  - Will hold coreg and  lasix as these were held during  admission and BPs have been stable, pt remaining euvolemic.  Generalized deconditioning - Needs PT at nursing home  Hypokalemia: Resolved.   Hypomagnesemia: Resolved.   Discharge Instructions  Allergies as of 06/11/2017   No Known Allergies     Medication List    STOP taking these medications   carvedilol 12.5 MG tablet Commonly known as:  COREG   furosemide 20 MG tablet Commonly known as:  LASIX   Potassium Chloride ER 20 MEQ Tbcr     TAKE these medications   acetaminophen 325 MG tablet Commonly known as:  TYLENOL Take by mouth as needed for mild pain or headache.   HYDROcodone-acetaminophen 5-325 MG tablet Commonly known as:  NORCO/VICODIN Take 1-2 tablets by mouth every 6 (six) hours as needed for moderate pain or severe pain.      Follow-up Information    Nicholes Stairs, MD Follow up in 1 week(s).   Specialty:  Orthopedic Surgery Why:  For suture removal, For wound re-check Contact information: [address] [place] [address] [phone]        Helayne Seminole, MD.   Specialty:  Otolaryngology Why:  We will coordinate outpatient appointments for you. Thank you.  Contact information: [place] [address] [phone]        Rankins, Bill Salinas, MD Follow up.   Specialty:  Family Medicine Contact information: [place] [address] [phone]        Ardath Sax, MD Follow up.   Specialty:  Hematology and Oncology Contact information: [place] [address] [phone]        Eppie Gibson, MD .   Specialty:  Radiation Oncology Contact information: [place]. [place] [address] [phone]          No Known Allergies  Consultations:  PCCM, General surgery, ENT  Procedures:  Tracheostomy on 05/29/2017 Right hip surgery with intramedullary implant on 05/29/2017 Direct laryngoscopy with biopsy of  epiglottic mass on 06/08/2017  Dg Chest 1 View  Result Date: 05/29/2017 CLINICAL DATA:  Pain following fall EXAM: CHEST  1 VIEW COMPARISON:  March 02, 2014 FINDINGS: No edema or consolidation. Heart size and pulmonary vascularity are normal. No adenopathy. There is aortic atherosclerosis. No pneumothorax. No bone lesions. IMPRESSION: Aortic atherosclerosis. No edema or consolidation. No pneumothorax. Aortic Atherosclerosis (ICD10-I70.0). Electronically Signed   By: Lowella Grip III M.D.   On: 05/29/2017 11:41   Ct Soft Tissue Neck W Contrast  Result Date: 06/02/2017 CLINICAL DATA:  Epiglottic mass EXAM: CT NECK WITH CONTRAST TECHNIQUE: Multidetector CT imaging of the neck was performed using the standard protocol following the bolus administration of intravenous contrast. CONTRAST:  9mL ISOVUE-300 IOPAMIDOL (ISOVUE-300) INJECTION 61% COMPARISON:  None. FINDINGS: PHARYNX AND LARYNX: --Nasopharynx: Fossae of Rosenmuller are clear. Normal adenoid tonsils for age. --Oral cavity and oropharynx: Normal palatine tonsils. The tongue and oral cavity are normal. --Hypopharynx: There is filling of the vallecula and effacement of the piriform sinuses. --Larynx: There is a uniformly hyperdense mass anterior to the epiglottis that measures approximately 3.4 x 2.1 x 3.8 cm. --Retropharyngeal space: No abscess, effusion or lymphadenopathy. SALIVARY GLANDS: --Parotid: No mass lesion or inflammation. No sialolithiasis or ductal dilatation. --Submandibular: Symmetric without inflammation. No sialolithiasis or ductal dilatation. --Sublingual: Normal. No ranula or other visible lesion of the base of tongue and floor of mouth. THYROID: Normal. LYMPH NODES: No enlarged  or abnormal density lymph nodes. VASCULAR: Major cervical vessels are patent. LIMITED INTRACRANIAL: Normal. VISUALIZED ORBITS: Normal. MASTOIDS AND VISUALIZED PARANASAL SINUSES: No fluid levels or advanced mucosal thickening. No mastoid effusion. SKELETON: No  bony spinal canal stenosis. No lytic or blastic lesions. UPPER CHEST: Bilateral pleural effusions. Tracheostomy tube tip in the upper trachea. OTHER: None. IMPRESSION: 1. Uniformly hypoattenuating mass filling the vallecula and effacing the piriform sinuses. Vascular malformation or hemangioma are primary considerations. Lingual thyroid tissue could also cause this appearance, but is less likely given the normal thyroid present in this patient and the differences in attenuation between the mass and the thyroid gland. Hemorrhagic neoplasm is a more likely possibility. 2. No cervical lymphadenopathy. 3. Pleural effusions are better assessed on the concomitant chest CT. Satisfactory position of tracheostomy tube. Electronically Signed   By: Ulyses Jarred M.D.   On: 06/02/2017 17:17   Ct Chest W Contrast  Result Date: 06/03/2017 CLINICAL DATA:  Epiglottic mass. Shortness of breath. History of recent fall with right femur fracture. EXAM: CT CHEST WITH CONTRAST TECHNIQUE: Multidetector CT imaging of the chest was performed during intravenous contrast administration. CONTRAST:  84mL ISOVUE-300 IOPAMIDOL (ISOVUE-300) INJECTION 61% COMPARISON:  Chest x-ray 05/29/2017 FINDINGS: Cardiovascular: The heart is normal in size. No pericardial effusion. There is mild tortuosity, ectasia and calcification of the thoracic aorta but no focal aneurysm or dissection. Aortic valve calcifications are noted along with coronary artery calcifications. The pulmonary arteries are grossly normal. Mediastinum/Nodes: No mediastinal or hilar mass or lymphadenopathy. Small scattered lymph nodes are noted. The tracheostomy tube is in good position at the mid tracheal level. The esophagus is grossly normal. There is a small hiatal hernia and possible distal esophageal inflammation. Lungs/Pleura: Moderate breathing motion artifact. There are moderate-sized bilateral pleural effusions with overlying atelectasis. There are underlying emphysematous  changes. No worrisome pulmonary lesions. No pulmonary nodules to suggest metastatic disease. Upper Abdomen: Heterogeneous appearance of the liver could be due to geographic fatty infiltration, intrinsic liver disease or vascular congestion. No adenopathy. Diffuse mesenteric edema and small volume ascites suspected. Musculoskeletal: Diffuse body wall edema. Very dense breast tissue but no obvious breast mass. No supraclavicular or axillary adenopathy. Small thyroid nodules are noted. The tracheostomy tube is in good position. The bony structures are unremarkable.  No worrisome bone lesions. IMPRESSION: 1. Tracheostomy tube in good position at the mid tracheal level. 2. Moderate-sized bilateral pleural effusions with overlying atelectasis. 3. Emphysematous changes but no worrisome pulmonary lesions or pulmonary nodules to suggest metastatic disease. 4. Heterogeneous appearance of the liver, suspect intrinsic liver disease, geographic fatty infiltration or vascular congestion. Recommend correlation with liver function studies. 5. Diffuse body wall edema and mesenteric edema. Aortic Atherosclerosis (ICD10-I70.0) and Emphysema (ICD10-J43.9). Electronically Signed   By: Marijo Sanes M.D.   On: 06/03/2017 11:35   Ct Hip Right Wo Contrast  Result Date: 05/29/2017 CLINICAL DATA:  Right hip pain since a fall 3 days ago. Initial encounter. EXAM: CT OF THE RIGHT HIP WITHOUT CONTRAST TECHNIQUE: Multidetector CT imaging of the right hip was performed according to the standard protocol. Multiplanar CT image reconstructions were also generated. COMPARISON:  Plain films right hip 05/29/2017. FINDINGS: Bones/Joint/Cartilage As seen on the comparison plain films, the patient has an acute right intertrochanteric fracture. Impaction at the medial margin of the fracture of approximately 1 cm is identified. Mild varus angulation is noted. No finding to suggest pathologic fracture is identified. The right femoral head is located.  Ligaments Suboptimally assessed by CT. Muscles  and Tendons Intact. Soft tissues Imaged intrapelvic contents demonstrate no acute abnormality. IMPRESSION: Acute right intertrochanteric fracture as described above. No other acute abnormality. Electronically Signed   By: Inge Rise M.D.   On: 05/29/2017 13:45   Dg Chest Port 1 View  Result Date: 06/05/2017 CLINICAL DATA:  Dyspnea EXAM: PORTABLE CHEST 1 VIEW COMPARISON:  06/02/2017 FINDINGS: Tracheostomy tube is noted in satisfactory position. Cardiac shadow is within normal limits. Small bilateral pleural effusions are again seen and stable. The degree of right basilar infiltrate has improved slightly in the interval from the prior exam. No new focal infiltrate is seen. IMPRESSION: Small bilateral pleural effusions and right basilar infiltrates slightly improved from the prior exam. Electronically Signed   By: Inez Catalina M.D.   On: 06/05/2017 08:16   Dg Chest Port 1 View  Result Date: 05/29/2017 CLINICAL DATA:  Respiratory failure.  Tracheostomy check. EXAM: PORTABLE CHEST 1 VIEW COMPARISON:  05/29/2017 at 11:02 a.m. FINDINGS: Tracheostomy has been placed since the earlier exam. Tip projects in the upper thoracic trachea. Cardiac silhouette is normal in size. No mediastinal or hilar masses. Lungs clear allowing for the supine positioning. IMPRESSION: 1. Well-positioned tracheostomy tube. 2. No acute cardiopulmonary disease. Electronically Signed   By: Lajean Manes M.D.   On: 05/29/2017 21:18   Dg Abd Portable 1v  Result Date: 05/30/2017 CLINICAL DATA:  NG tube placement EXAM: PORTABLE ABDOMEN - 1 VIEW COMPARISON:  None. FINDINGS: Enteric tube tip is in the left upper quadrant consistent with location in the upper stomach. Visualized lung bases are clear. Gas and stool throughout the colon. Visualized bowel are not abnormally dilated. IMPRESSION: Enteric tube tip is in the left upper quadrant consistent with location in the upper stomach.  Electronically Signed   By: Lucienne Capers M.D.   On: 05/30/2017 00:47   Dg Swallowing Func-speech Pathology  Result Date: 06/01/2017 Objective Swallowing Evaluation: Type of Study: MBS-Modified Barium Swallow Study  Patient Details Name: Katelyn [name] MRN: [id number] Date of Birth: [birth year] Today's Date: 06/01/2017 Time: SLP Start Time (ACUTE ONLY): 1325 -SLP Stop Time (ACUTE ONLY): 1346 SLP Time Calculation (min) (ACUTE ONLY): 21 min Past Medical History: Past Medical History: Diagnosis Date . Acute gallstone pancreatitis 01/08/2014 . AKI (acute kidney injury) ([place]) 01/08/2014 . Anemia  . Blood transfusion   11'15 last admission . Cholecystitis  . Hypercholesteremia  . Hypertension  . Right leg DVT ([place]) 05/08/2014 . Septic shock - Kelbsiella 01/09/2014 . Shortness of breath  . Stroke [facility])   '13- right side weakness, ambulates with cane Past Surgical History: Past Surgical History: Procedure Laterality Date . ABDOMINAL HYSTERECTOMY   . ENDOSCOPIC RETROGRADE CHOLANGIOPANCREATOGRAPHY (ERCP) WITH PROPOFOL N/A 01/11/2014  Procedure: ENDOSCOPIC RETROGRADE CHOLANGIOPANCREATOGRAPHY (ERCP) WITH PROPOFOL;  Surgeon: Gatha Mayer, MD;  Location: [place];  Service: Endoscopy;  Laterality: N/A; . ERCP   . ERCP N/A 05/26/2014  Procedure: ENDOSCOPIC RETROGRADE CHOLANGIOPANCREATOGRAPHY (ERCP);  Surgeon: Gatha Mayer, MD;  Location: [facility];  Service: Endoscopy;  Laterality: N/A; . ESOPHAGOGASTRODUODENOSCOPY (EGD) WITH PROPOFOL N/A 12/07/2014  Procedure: ESOPHAGOGASTRODUODENOSCOPY (EGD) WITH PROPOFOL;  Surgeon: Milus Banister, MD;  Location: [facility] ENDOSCOPY;  Service: Endoscopy;  Laterality: N/A; . EUS N/A 12/07/2014  Procedure: UPPER ENDOSCOPIC ULTRASOUND (EUS) LINEAR;  Surgeon: Milus Banister, MD;  Location: [facility] ENDOSCOPY;  Service: Endoscopy;  Laterality: N/A; . GASTROINTESTINAL STENT REMOVAL N/A 12/07/2014  Procedure: GASTROINTESTINAL STENT REMOVAL;  Surgeon: Milus Banister, MD;  Location: [facility] ENDOSCOPY;   Service: Endoscopy;  Laterality: N/A;  pancreatic stent removal . TEE WITHOUT CARDIOVERSION  03/06/2011  Procedure: TRANSESOPHAGEAL ECHOCARDIOGRAM (TEE);  Surgeon: Jolaine Artist, MD;  Location[facility] ENDOSCOPY;  Service: Cardiovascular;  Laterality: N/A; HPI: 61yoF with hx CVA, DVT (2016), HTN, Anemia, Gallstone pancreatitis, and Epiglottis mass, who presented with a hip fracture, now s/p operative repair. Intubated for procedure; postop patient developed respiratory distress and required emergent trach.  Subjective: Family reports weight loss Assessment / Plan / Recommendation CHL IP CLINICAL IMPRESSIONS 06/01/2017 Clinical Impression Pt demonstrates a mild pharyngeal dysphagia in the setting of known epiglottic mass that appears to obstruct the majority of the vestibule. Pt did have some mild penetration around the mass, which was ejected with subsequent swallows. Mild residuals also observed in pyriforms which cleared with second swallows. Pt is at low risk of aspiration at this phase of her condition given the obstuction of the trachea. Pt may initiate a dys 3 mechanical soft diet (no dentition) and thin liquids until any treatment of mass begins. Radiation or surgery will change function and retesting would be warranted.  SLP Visit Diagnosis Dysphagia, oropharyngeal phase (R13.12) Attention and concentration deficit following -- Frontal lobe and executive function deficit following -- Impact on safety and function Mild aspiration risk   CHL IP TREATMENT RECOMMENDATION 06/01/2017 Treatment Recommendations Therapy as outlined in treatment plan below   Prognosis 06/01/2017 Prognosis for Safe Diet Advancement Good Barriers to Reach Goals -- Barriers/Prognosis Comment -- CHL IP DIET RECOMMENDATION 06/01/2017 SLP Diet Recommendations Dysphagia 3 (Mech soft) solids;Thin liquid Liquid Administration via Cup;Straw Medication Administration Whole meds with puree Compensations -- Postural Changes Seated upright at 90 degrees    CHL IP OTHER RECOMMENDATIONS 06/01/2017 Recommended Consults Consider ENT evaluation Oral Care Recommendations -- Other Recommendations --   CHL IP FOLLOW UP RECOMMENDATIONS 06/01/2017 Follow up Recommendations Outpatient SLP   CHL IP FREQUENCY AND DURATION 06/01/2017 Speech Therapy Frequency (ACUTE ONLY) min 2x/week Treatment Duration 1 week      CHL IP ORAL PHASE 06/01/2017 Oral Phase WFL Oral - Pudding Teaspoon -- Oral - Pudding Cup -- Oral - Honey Teaspoon -- Oral - Honey Cup -- Oral - Nectar Teaspoon -- Oral - Nectar Cup -- Oral - Nectar Straw -- Oral - Thin Teaspoon -- Oral - Thin Cup -- Oral - Thin Straw -- Oral - Puree -- Oral - Mech Soft -- Oral - Regular -- Oral - Multi-Consistency -- Oral - Pill -- Oral Phase - Comment --  CHL IP PHARYNGEAL PHASE 06/01/2017 Pharyngeal Phase Impaired Pharyngeal- Pudding Teaspoon -- Pharyngeal -- Pharyngeal- Pudding Cup -- Pharyngeal -- Pharyngeal- Honey Teaspoon -- Pharyngeal -- Pharyngeal- Honey Cup -- Pharyngeal -- Pharyngeal- Nectar Teaspoon -- Pharyngeal -- Pharyngeal- Nectar Cup -- Pharyngeal -- Pharyngeal- Nectar Straw -- Pharyngeal -- Pharyngeal- Thin Teaspoon -- Pharyngeal -- Pharyngeal- Thin Cup Pharyngeal residue - pyriform;Penetration/Aspiration during swallow Pharyngeal Material enters airway, remains ABOVE vocal cords then ejected out;Material does not enter airway Pharyngeal- Thin Straw Pharyngeal residue - pyriform;Penetration/Aspiration during swallow Pharyngeal Material does not enter airway;Material enters airway, remains ABOVE vocal cords then ejected out Pharyngeal- Puree Pharyngeal residue - pyriform Pharyngeal -- Pharyngeal- Mechanical Soft -- Pharyngeal -- Pharyngeal- Regular Pharyngeal residue - pyriform Pharyngeal -- Pharyngeal- Multi-consistency -- Pharyngeal -- Pharyngeal- Pill -- Pharyngeal -- Pharyngeal Comment --  No flowsheet data found. No flowsheet data found. DeBlois, Katherene Ponto 06/01/2017, 2:18 PM              Dg C-arm 1-60 Min  Result Date:  05/29/2017 CLINICAL DATA:  Right femoral intramedullary nail fixation. EXAM: RIGHT FEMUR 2 VIEWS; DG C-ARM 61-120 MIN COMPARISON:  Preoperative CT and radiographs from the same day FINDINGS: A total of 48 seconds of fluoroscopic time was utilized for placement of right femoral nail across an intertrochanteric fracture of the right femur. Fine bony detail is limited on the 4 images acquired from the fluoroscopic technique. No intraoperative nor immediate postoperative complications are identified. IMPRESSION: New intramedullary nail fixation of intertrochanteric fracture of the right femur without immediate complications identified. Electronically Signed   By: Ashley Royalty M.D.   On: 05/29/2017 19:45   Dg Hip Unilat W Or Wo Pelvis 2-3 Views Right  Result Date: 05/29/2017 CLINICAL DATA:  Pain following fall EXAM: DG HIP (WITH OR WITHOUT PELVIS) 2-3V RIGHT COMPARISON:  None. FINDINGS: Frontal pelvis as well as frontal and lateral right hip images were obtained. There is an intertrochanteric fracture of the right femur with varus angulation at the fracture site. No other fracture. No dislocation. There is mild symmetric narrowing of both hip joints. IMPRESSION: Intertrochanteric femur fracture on the right with varus angulation at the fracture site. No other fracture. No dislocation. Mild symmetric narrowing of each hip joint. Electronically Signed   By: Lowella Grip III M.D.   On: 05/29/2017 11:40   Dg Femur, Min 2 Views Right  Result Date: 05/29/2017 CLINICAL DATA:  Right femoral intramedullary nail fixation. EXAM: RIGHT FEMUR 2 VIEWS; DG C-ARM 61-120 MIN COMPARISON:  Preoperative CT and radiographs from the same day FINDINGS: A total of 48 seconds of fluoroscopic time was utilized for placement of right femoral nail across an intertrochanteric fracture of the right femur. Fine bony detail is limited on the 4 images acquired from the fluoroscopic technique. No intraoperative nor immediate postoperative  complications are identified. IMPRESSION: New intramedullary nail fixation of intertrochanteric fracture of the right femur without immediate complications identified. Electronically Signed   By: Ashley Royalty M.D.   On: 05/29/2017 19:45   Subjective: Pain is controlled. Frustrated by inability to phonate, but can communicate by writing. Breathing is stable. Denies chest pain, fever, cough, bleeding, bruising.   Discharge Exam: Vitals:   06/11/17 0448 06/11/17 0724  BP: (!) 121/97   Pulse: 64 83  Resp: 20 20  Temp: 98.6 F (37 C)   SpO2: 100% 95%   General: No distress Neck: Tracheostomy site without purulence.  Pulm: Non-labored, decreased bilaterally, no wheezing. CV: Regular rate and rhythm. No murmur, rub, or gallop. No JVD, no pedal edema. GI: Abdomen soft, non-tender, non-distended, with normoactive bowel sounds. No organomegaly or masses felt. Ext: RLE with lateral dressing c/d/i appropriately tender with soft compartment, distally neurovascularly intact.   Labs: Basic Metabolic Panel: Recent Labs  Lab 06/06/17 0520 06/07/17 0719 06/08/17 0636 06/09/17 0538 06/10/17 0542  NA 139 137 134* 135 135  K 3.3* 4.8 4.5 4.4 4.1  CL 93* 90* 87* 87* 89*  CO2 37* 35* 37* 33* 36*  GLUCOSE 84 93 83 118* 83  BUN 11 10 18  29* 30*  CREATININE 0.81 0.90 0.97 1.41* 1.21*  CALCIUM 8.7* 8.9 8.7* 8.7* 8.8*  MG 1.5* 2.0 2.0 2.1 2.2   CBC: Recent Labs  Lab 06/05/17 0634 06/06/17 0520 06/09/17 0538 06/10/17 0542  WBC 8.2 8.4 10.4 13.5*  NEUTROABS 5.0 5.0 8.4* 10.7*  HGB 10.1* 10.2* 10.3* 9.2*  HCT 32.8* 32.2* 32.8* 29.4*  MCV 85.4 84.5 84.5 86.7  PLT 264 273 382 363   Urinalysis    Component Value  Date/Time   COLORURINE AMBER (A) 06/04/2017 1841   APPEARANCEUR CLEAR 06/04/2017 1841   LABSPEC 1.028 06/04/2017 1841   PHURINE 5.0 06/04/2017 1841   GLUCOSEU NEGATIVE 06/04/2017 1841   HGBUR MODERATE (A) 06/04/2017 1841   HGBUR trace-lysed 12/30/2006 [place]  (A) 06/04/2017 1841   KETONESUR NEGATIVE 06/04/2017 1841   PROTEINUR NEGATIVE 06/04/2017 1841   UROBILINOGEN 4.0 (H) 01/07/2014 2323   NITRITE NEGATIVE 06/04/2017 1841   LEUKOCYTESUR SMALL (A) 06/04/2017 1841    Time coordinating discharge: Approximately 40 minutes  Vance Gather, MD  [facility] 06/11/2017, 12:14 PM Pager [phone]

## 2017-06-12 ENCOUNTER — Telehealth: Payer: Self-pay | Admitting: *Deleted

## 2017-06-12 NOTE — Telephone Encounter (Signed)
Oncology Nurse Navigator Documentation  Returned call to pt dtr [name] who confirmed her mother's DC to [facility] last evening.   Answered her questions re diagnosis and staging of her mother's cancer in context of H&N cancers in general. She noted her mother has appt 5/8 9:45 with Dr. Vicie Mutters. I encouraged her to call me with needs/concerns prior to 4/30 [facility] appts.  [name], RN, BSN Head & Neck Oncology Nurse [facility] [place] [facility] [place] [phone]

## 2017-06-12 NOTE — Progress Notes (Signed)
Trach transport set up provided to EMS for pt's discharge.

## 2017-06-15 DIAGNOSIS — J9601 Acute respiratory failure with hypoxia: Secondary | ICD-10-CM | POA: Diagnosis not present

## 2017-06-15 DIAGNOSIS — S72001D Fracture of unspecified part of neck of right femur, subsequent encounter for closed fracture with routine healing: Secondary | ICD-10-CM | POA: Diagnosis not present

## 2017-06-15 DIAGNOSIS — C321 Malignant neoplasm of supraglottis: Secondary | ICD-10-CM | POA: Diagnosis not present

## 2017-06-15 DIAGNOSIS — Z930 Tracheostomy status: Secondary | ICD-10-CM | POA: Diagnosis not present

## 2017-06-17 DIAGNOSIS — N183 Chronic kidney disease, stage 3 (moderate): Secondary | ICD-10-CM | POA: Diagnosis not present

## 2017-06-17 DIAGNOSIS — I129 Hypertensive chronic kidney disease with stage 1 through stage 4 chronic kidney disease, or unspecified chronic kidney disease: Secondary | ICD-10-CM | POA: Diagnosis not present

## 2017-06-17 DIAGNOSIS — I5032 Chronic diastolic (congestive) heart failure: Secondary | ICD-10-CM | POA: Diagnosis not present

## 2017-06-17 DIAGNOSIS — C321 Malignant neoplasm of supraglottis: Secondary | ICD-10-CM | POA: Diagnosis not present

## 2017-06-18 NOTE — Progress Notes (Signed)
Head and Neck Cancer Location of Tumor / Histology:  06/08/16 Diagnosis Epiglottis, biopsy, mass - INVASIVE SQUAMOUS CELL CARCINOMA.  Patient presented months ago with symptoms of:  Dr. Blenda Nicely documents on 06/02/17 during inpatient consult: She states she was told that she had a mass a couple of years ago. Has not had any shortness of breath at home. She presented a few days ago after a fall and underwent intermedullary nail of her right femur- this was done using a spinal block. In PACU on 05/29/17, she began to have acute desaturation, shortness of breath. She was unable to be re-intubated due to a large, exophytic epiglottic mass. A PERC trach was placed emergently by General Surgery.  Biopsies of Epiglottis revealed: Invasive cell carcinoma  Nutrition Status Yes No Comments  Weight changes? [x]  []  She has lost about 60 lbs over the past year or so, her family is not exactly sure.   Swallowing concerns? [x]  []  She denies, but her family believes that she does have difficulty swallowing.  Her facility does cut up her food well for her .   PEG? []  [x]     Referrals Yes No Comments  Social Work? []  [x]    Dentistry? []  [x]    Swallowing therapy? []  [x]    Nutrition? []  [x]    Med/Onc? [x]  []  Dr. Lebron Conners 06/23/17   Safety Issues Yes No Comments  Prior radiation? []  [x]    Pacemaker/ICD? []  [x]    Possible current pregnancy? []  [x]    Is the patient on methotrexate? []  [x]     Tobacco/Marijuana/Snuff/ETOH use: She is a former smoker quitting recently during hospitalization in April. She does not drink alcohol.   Past/Anticipated interventions by otolaryngology, if any:  05/29/17 PROCEDURE:  Procedure(s): PERCUTANEOUS TRACHEOSTOMY SURGEON:  Surgeon(s): Judeth Horn, MD Nicholes Stairs, MD  06/08/17 PROCEDURE(S): Direct laryngoscopy with operating telescope with biopsies SURGEON: Gavin Pound, MD   Past/Anticipated interventions by medical oncology, if any:  Dr. Lebron Conners  06/23/17 at 3:20.    Current Complaints / other details:   05/29/17 PROCEDURE: Treatment of intertrochanteric, pertrochanteric, subtrochanteric fracture with intramedullary implant. CPT [phone]  SURGEON: Dannielle Karvonen. Stann Mainland, M.D.   Surgery completed after a fall at home 05/29/17  She was discharged to [facility] facility 06/11/17.  BP 128/83   Pulse 89   Temp 97.8 F (36.6 C)   Resp (!) 22   Ht 4\' 7"  (1.397 m)   Wt 82 lb (37.2 kg)   SpO2 100% Comment: room air  BMI 19.06 kg/m    Wt Readings from Last 3 Encounters:  06/23/17 82 lb (37.2 kg)  06/11/17 82 lb 0.2 oz (37.2 kg)  12/07/14 122 lb (55.3 kg)

## 2017-06-19 DIAGNOSIS — K5909 Other constipation: Secondary | ICD-10-CM | POA: Diagnosis not present

## 2017-06-19 DIAGNOSIS — Z471 Aftercare following joint replacement surgery: Secondary | ICD-10-CM | POA: Diagnosis not present

## 2017-06-19 DIAGNOSIS — R630 Anorexia: Secondary | ICD-10-CM | POA: Diagnosis not present

## 2017-06-22 DIAGNOSIS — K5909 Other constipation: Secondary | ICD-10-CM | POA: Diagnosis not present

## 2017-06-22 DIAGNOSIS — Z471 Aftercare following joint replacement surgery: Secondary | ICD-10-CM | POA: Diagnosis not present

## 2017-06-23 ENCOUNTER — Encounter: Payer: Self-pay | Admitting: Radiation Oncology

## 2017-06-23 ENCOUNTER — Inpatient Hospital Stay: Payer: Medicare HMO | Attending: Hematology and Oncology | Admitting: Hematology and Oncology

## 2017-06-23 ENCOUNTER — Ambulatory Visit
Admission: RE | Admit: 2017-06-23 | Discharge: 2017-06-23 | Disposition: A | Discharge status: Home / Self Care | Payer: Medicare HMO | Source: Ambulatory Visit | Attending: Radiation Oncology | Admitting: Radiation Oncology

## 2017-06-23 ENCOUNTER — Other Ambulatory Visit: Payer: Self-pay

## 2017-06-23 ENCOUNTER — Encounter: Payer: Self-pay | Admitting: *Deleted

## 2017-06-23 VITALS — BP 128/83 | HR 89 | Temp 97.8°F | Resp 22 | Ht <= 58 in | Wt 82.0 lb

## 2017-06-23 DIAGNOSIS — D649 Anemia, unspecified: Secondary | ICD-10-CM | POA: Insufficient documentation

## 2017-06-23 DIAGNOSIS — R1310 Dysphagia, unspecified: Secondary | ICD-10-CM | POA: Diagnosis not present

## 2017-06-23 DIAGNOSIS — Z87891 Personal history of nicotine dependence: Secondary | ICD-10-CM

## 2017-06-23 DIAGNOSIS — F331 Major depressive disorder, recurrent, moderate: Secondary | ICD-10-CM

## 2017-06-23 DIAGNOSIS — R630 Anorexia: Secondary | ICD-10-CM | POA: Diagnosis not present

## 2017-06-23 DIAGNOSIS — Z86718 Personal history of other venous thrombosis and embolism: Secondary | ICD-10-CM | POA: Insufficient documentation

## 2017-06-23 DIAGNOSIS — F17210 Nicotine dependence, cigarettes, uncomplicated: Secondary | ICD-10-CM | POA: Insufficient documentation

## 2017-06-23 DIAGNOSIS — Z8673 Personal history of transient ischemic attack (TIA), and cerebral infarction without residual deficits: Secondary | ICD-10-CM | POA: Insufficient documentation

## 2017-06-23 DIAGNOSIS — R05 Cough: Secondary | ICD-10-CM | POA: Diagnosis not present

## 2017-06-23 DIAGNOSIS — Z8719 Personal history of other diseases of the digestive system: Secondary | ICD-10-CM | POA: Insufficient documentation

## 2017-06-23 DIAGNOSIS — C139 Malignant neoplasm of hypopharynx, unspecified: Secondary | ICD-10-CM | POA: Diagnosis not present

## 2017-06-23 DIAGNOSIS — M2550 Pain in unspecified joint: Secondary | ICD-10-CM | POA: Diagnosis not present

## 2017-06-23 DIAGNOSIS — J90 Pleural effusion, not elsewhere classified: Secondary | ICD-10-CM | POA: Diagnosis not present

## 2017-06-23 DIAGNOSIS — E7800 Pure hypercholesterolemia, unspecified: Secondary | ICD-10-CM | POA: Diagnosis not present

## 2017-06-23 DIAGNOSIS — Z803 Family history of malignant neoplasm of breast: Secondary | ICD-10-CM | POA: Insufficient documentation

## 2017-06-23 DIAGNOSIS — R231 Pallor: Secondary | ICD-10-CM | POA: Insufficient documentation

## 2017-06-23 DIAGNOSIS — J439 Emphysema, unspecified: Secondary | ICD-10-CM | POA: Diagnosis not present

## 2017-06-23 DIAGNOSIS — I10 Essential (primary) hypertension: Secondary | ICD-10-CM | POA: Diagnosis not present

## 2017-06-23 DIAGNOSIS — C321 Malignant neoplasm of supraglottis: Secondary | ICD-10-CM | POA: Diagnosis not present

## 2017-06-23 DIAGNOSIS — K449 Diaphragmatic hernia without obstruction or gangrene: Secondary | ICD-10-CM | POA: Insufficient documentation

## 2017-06-23 DIAGNOSIS — R5383 Other fatigue: Secondary | ICD-10-CM | POA: Diagnosis not present

## 2017-06-23 DIAGNOSIS — M549 Dorsalgia, unspecified: Secondary | ICD-10-CM | POA: Insufficient documentation

## 2017-06-23 DIAGNOSIS — Z79899 Other long term (current) drug therapy: Secondary | ICD-10-CM | POA: Insufficient documentation

## 2017-06-23 DIAGNOSIS — I700 Atherosclerosis of aorta: Secondary | ICD-10-CM | POA: Diagnosis not present

## 2017-06-23 DIAGNOSIS — F17211 Nicotine dependence, cigarettes, in remission: Secondary | ICD-10-CM | POA: Diagnosis not present

## 2017-06-23 MED ORDER — CITALOPRAM HYDROBROMIDE 10 MG/5ML PO SOLN: 20.0000 mg | Freq: Every day | ORAL | 1 refills | Status: DC

## 2017-06-23 NOTE — Progress Notes (Signed)
Radiation Oncology         [phone]) [phone] ________________________________  Initial Outpatient Consultation  Name: Katelyn [name] MRN: [id number]  Date: 06/23/2017  DOB: 07-Oct-1950  TG:GYIRSWN, Bill Salinas, MD  Helayne Seminole, MD   REFERRING PHYSICIAN: Helayne Seminole, MD  DIAGNOSIS:    ICD-10-CM   1. Moderate episode of recurrent major depressive disorder (HCC) F33.1 citalopram (CELEXA) 10 MG/5ML suspension  2. 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 Blenda Nicely  CHIEF COMPLAINT: Here to discuss management of her throat cancer  HISTORY OF PRESENT ILLNESS::Katelyn [name] is a 67 y.o. female who presented to ED after a fall and subsequently underwent intermedullary nail of her right femur using spinal block. In PACU on 05/29/17, she began to have acute desaturation and shortness of breath. She was unable to be re-intubated due to a large, exophytic epiglottic mass. She mentioned that she was told of mass "a couple of years ago" after undergoing anesthesia.  Family states that she was told to see an otolaryngologist but she did not. More recently, a PERC trach was placed emergently by General Surgery.  She was seen by Dr. Blenda Nicely with [facility] otolaryngology. She performed laryngoscopy on 06/02/17. This showed nasal cavity and nasopharynx to be unremarkable. There were no suspicious findings in the nasopharynx or Fossa of Rosenmller. The tongue base was normal. The pharyngeal walls appeared to be normal, but it was difficult to fully visualize. There was a large, exophytic supraglottic mass encompassing the entirety of the epiglottis. She could not visualize pyriform sinuses or vallecula,  false or true cords. No discernable airway. Did not visualize any arytenoid abduction with attempts at phonation. The post cricoid region appeared patent.  In her notes, she staged this as a T4a hypopharyngeal mass  Neck CT on 06/02/17 showed uniformly hypoattenuating mass filling the vallecula and effacing the piriform sinuses. Vascular malformation or hemangioma were primary considerations. Lingual thyroid tissue could also cause this appearance, but is less likely given the normal thyroid present in this patient and the differences in attenuation between the mass and the thyroid gland. Hemorrhagic neoplasm is a more likely possibility. No cervical lymphadenopathy was appreciated.  Chest CT on same day showed moderate-sized bilateral pleural effusions with overlying atelectasis. Emphysematous changes but no worrisome pulmonary lesions or pulmonary nodules to suggest metastasis.  I have personally reviewed the patient's imaging.  Patient proceeded to biopsy on 06/08/17 with pathology confirming invasive squamous cell carcinoma.  She is scheduled to meet with Dr. Vicie Mutters with [facility] ENT on May 8 to discuss surgical options. She is also scheduled to meet with Dr. Lebron Conners in medical oncology later today.  Swallowing issues, if any: She denies swallowing issues but her family isn't sure this is true. Her facility supposedly cuts up her food for her.  Weight Changes: She endorses having lost about 60 lbs over the past year or so but her family isn't certain of this.  Pain status: Denies pain currently but does endorse receiving pain medicine recently  Tobacco history, if any: Former smoker with quit day this month.  ETOH abuse, if any: Denies.  Prior cancers, if any: No  Patient's family reports the patient sleeps a lot, especially since passing of her  husband 2 years ago. This coincided with increased tobacco use. She denies any history of counseling. Denies hx of antidepressants. Patient's  daughter feels certain that the patient has a history of depression (based on her observations). Her stroke caused these mood symptoms to worsen and she seemed to isolate herself. She had difficulty walking but refused to wear leg brace as prescribed. Her daughter says she has panic-like episodes at night that require their care and attention to manage. The patient's daughter reports sleep difficulty. Endorses claustrophobia. RLE weakness since stroke.  PREVIOUS RADIATION THERAPY: No  PAST MEDICAL HISTORY:  has a past medical history of Acute gallstone pancreatitis (01/08/2014), AKI (acute kidney injury) ([place]) (01/08/2014), Anemia, Blood transfusion, Cholecystitis, Hypercholesteremia, Hypertension, Right leg DVT ([place]) (05/08/2014), Septic shock - Kelbsiella (01/09/2014), Shortness of breath, and Stroke ([place]).    PAST SURGICAL HISTORY: Past Surgical History:  Procedure Laterality Date  . ABDOMINAL HYSTERECTOMY    . DIRECT LARYNGOSCOPY N/A 06/08/2017   Procedure: DIRECT LARYNGOSCOPY WITH BIOPSY;  Surgeon: Helayne Seminole, MD;  Location[facility] OR;  Service: ENT;  Laterality: N/A;  . ENDOSCOPIC RETROGRADE CHOLANGIOPANCREATOGRAPHY (ERCP) WITH PROPOFOL N/A 01/11/2014   Procedure: ENDOSCOPIC RETROGRADE CHOLANGIOPANCREATOGRAPHY (ERCP) WITH PROPOFOL;  Surgeon: Gatha Mayer, MD;  Location: [place];  Service: Endoscopy;  Laterality: N/A;  . ERCP    . ERCP N/A 05/26/2014   Procedure: ENDOSCOPIC RETROGRADE CHOLANGIOPANCREATOGRAPHY (ERCP);  Surgeon: Gatha Mayer, MD;  Location: [facility];  Service: Endoscopy;  Laterality: N/A;  . ESOPHAGOGASTRODUODENOSCOPY (EGD) WITH PROPOFOL N/A 12/07/2014   Procedure: ESOPHAGOGASTRODUODENOSCOPY (EGD) WITH PROPOFOL;  Surgeon: Milus Banister, MD;  Location: [facility] ENDOSCOPY;  Service: Endoscopy;  Laterality: N/A;  . EUS N/A 12/07/2014   Procedure: UPPER ENDOSCOPIC ULTRASOUND (EUS) LINEAR;  Surgeon: Milus Banister, MD;  Location: [facility] ENDOSCOPY;  Service: Endoscopy;   Laterality: N/A;  . GASTROINTESTINAL STENT REMOVAL N/A 12/07/2014   Procedure: GASTROINTESTINAL STENT REMOVAL;  Surgeon: Milus Banister, MD;  Location: [facility] ENDOSCOPY;  Service: Endoscopy;  Laterality: N/A;  pancreatic stent removal  . INTRAMEDULLARY (IM) NAIL INTERTROCHANTERIC Right 05/29/2017   Procedure: INTRAMEDULLARY (IM) NAIL INTERTROCHANTRIC FEMUR;  Surgeon: Nicholes Stairs, MD;  Location: [place];  Service: Orthopedics;  Laterality: Right;  . PERCUTANEOUS TRACHEOSTOMY N/A 05/29/2017   Procedure: PERCUTANEOUS TRACHEOSTOMY;  Surgeon: Judeth Horn, MD;  Location: [place];  Service: General;  Laterality: N/A;  . TEE WITHOUT CARDIOVERSION  03/06/2011   Procedure: TRANSESOPHAGEAL ECHOCARDIOGRAM (TEE);  Surgeon: Jolaine Artist, MD;  Location[facility] END[facility]COPY;  Service: Cardiovascular;  Laterality: N/A;    FAMILY HISTORY: family history includes Breast cancer in her maternal aunt; Diabetes Mellitus I in her father and sister.  SOCIAL HISTORY:  reports that she quit smoking about 3 weeks ago. Her smoking use included cigarettes. She smoked 0.25 packs per day. She has never used smokeless tobacco. She reports that she does not drink alcohol or use drugs.  ALLERGIES: Patient has no known allergies.  MEDICATIONS:  Current Outpatient Medications  Medication Sig Dispense Refill  . acetaminophen (TYLENOL) 325 MG tablet Take by mouth as needed for mild pain or headache.    Marland Kitchen oxycodone (OXY-IR) 5 MG capsule Take 10 mg by mouth every 4 (four) hours as needed. 1-2 tablets every 6 hours as needed for pain    . polyethylene glycol (MIRALAX / GLYCOLAX) packet Take 17 g by mouth daily as needed for mild constipation.    . sennosides-docusate sodium (SENOKOT-S) 8.6-50 MG tablet Take 2 tablets by mouth  daily. Every HS prn constipation    . citalopram (CELEXA) 10 MG/5ML suspension Take 10 mLs (20 mg total) by mouth daily. 300 mL 1   No current facility-administered medications for this encounter.     REVIEW  OF SYSTEMS:  Notable for that above.   PHYSICAL EXAM:  height is 4\' 7"  (1.397 m) and weight is 82 lb (37.2 kg). Her temperature is 97.8 F (36.6 C). Her blood pressure is 128/83 and her pulse is 89. Her respiration is 22 (abnormal) and oxygen saturation is 100%.   General: Alert and oriented, in no acute distress. She is edentulous. She has a tracheostomy device in place. She is surrounded by family. On a gurney. HEENT: Head is normocephalic. Extraocular movements are intact. Oropharynx is notable for No obvious tumor in her upper throat. Patient had difficulty saying "ah" which limited sensitivity for the exam. Neck: No obvious masses appreciated in the cervical or supraclavicular regions. Neck exam was limited by trach devices and oxygen cuff Heart: Regular in rate and rhythm with no murmurs, rubs, or gallops. Chest: On expiration she has low pitched sounds bilaterally. Abdomen: Slightly distended but no rigidity, guarding or tenderness. Extremities: No cyanosis or edema. Lymphatics: see Neck Exam Skin: No concerning lesions. Musculoskeletal: She is recovering from right femoral surgery and is not moving her right leg very much. Neurologic: Cranial nerves II through XII are grossly intact. No obvious focalities. Speech is fluent.   Psychiatric: She appears depressed, and acknowledges depressed mood Larynx: did not perform laryngoscopy in light of upcoming visit with Dr. Vicie Mutters.  ECOG = 3  0 - Asymptomatic (Fully active, able to carry on all predisease activities without restriction)  1 - Symptomatic but completely ambulatory (Restricted in physically strenuous activity but ambulatory and able to carry out work of a light or sedentary nature. For example, light housework, office work)  2 - Symptomatic, <50% in bed during the day (Ambulatory and capable of all self care but unable to carry out any work activities. Up and about more than 50% of waking hours)  3 - Symptomatic, >50% in bed,  but not bedbound (Capable of only limited self-care, confined to bed or chair 50% or more of waking hours)  4 - Bedbound (Completely disabled. Cannot carry on any self-care. Totally confined to bed or chair)  5 - Death   Eustace Pen MM, [name] RH, Tormey DC, et al. [phone]). "Toxicity and response criteria of the [facility]". [place] Oncol. 5 (6): 649-55   LABORATORY DATA:  Lab Results  Component Value Date   WBC 13.5 (H) 06/10/2017   HGB 9.2 (L) 06/10/2017   HCT 29.4 (L) 06/10/2017   MCV 86.7 06/10/2017   PLT 363 06/10/2017   CMP     Component Value Date/Time   NA 135 06/10/2017 0542   K 4.1 06/10/2017 0542   CL 89 (L) 06/10/2017 0542   CO2 36 (H) 06/10/2017 0542   GLUCOSE 83 06/10/2017 0542   BUN 30 (H) 06/10/2017 0542   CREATININE 1.21 (H) 06/10/2017 0542   CALCIUM 8.8 (L) 06/10/2017 0542   PROT 7.1 05/29/2017 2100   ALBUMIN 2.3 (L) 05/29/2017 2100   AST 30 05/29/2017 2100   ALT 14 05/29/2017 2100   ALKPHOS 66 05/29/2017 2100   BILITOT 0.5 05/29/2017 2100   GFRNONAA 45 (L) 06/10/2017 0542   GFRAA 52 (L) 06/10/2017 0542       RADIOGRAPHY: Dg Chest 1 View  Result Date: 05/29/2017 CLINICAL  DATA:  Pain following fall EXAM: CHEST  1 VIEW COMPARISON:  March 02, 2014 FINDINGS: No edema or consolidation. Heart size and pulmonary vascularity are normal. No adenopathy. There is aortic atherosclerosis. No pneumothorax. No bone lesions. IMPRESSION: Aortic atherosclerosis. No edema or consolidation. No pneumothorax. Aortic Atherosclerosis (ICD10-I70.0). Electronically Signed   By: Lowella Grip III M.D.   On: 05/29/2017 11:41   Ct Soft Tissue Neck W Contrast  Result Date: 06/02/2017 CLINICAL DATA:  Epiglottic mass EXAM: CT NECK WITH CONTRAST TECHNIQUE: Multidetector CT imaging of the neck was performed using the standard protocol following the bolus administration of intravenous contrast. CONTRAST:  80mL ISOVUE-300 IOPAMIDOL (ISOVUE-300) INJECTION 61%  COMPARISON:  None. FINDINGS: PHARYNX AND LARYNX: --Nasopharynx: Fossae of Rosenmuller are clear. Normal adenoid tonsils for age. --Oral cavity and oropharynx: Normal palatine tonsils. The tongue and oral cavity are normal. --Hypopharynx: There is filling of the vallecula and effacement of the piriform sinuses. --Larynx: There is a uniformly hyperdense mass anterior to the epiglottis that measures approximately 3.4 x 2.1 x 3.8 cm. --Retropharyngeal space: No abscess, effusion or lymphadenopathy. SALIVARY GLANDS: --Parotid: No mass lesion or inflammation. No sialolithiasis or ductal dilatation. --Submandibular: Symmetric without inflammation. No sialolithiasis or ductal dilatation. --Sublingual: Normal. No ranula or other visible lesion of the base of tongue and floor of mouth. THYROID: Normal. LYMPH NODES: No enlarged or abnormal density lymph nodes. VASCULAR: Major cervical vessels are patent. LIMITED INTRACRANIAL: Normal. VISUALIZED ORBITS: Normal. MASTOIDS AND VISUALIZED PARANASAL SINUSES: No fluid levels or advanced mucosal thickening. No mastoid effusion. SKELETON: No bony spinal canal stenosis. No lytic or blastic lesions. UPPER CHEST: Bilateral pleural effusions. Tracheostomy tube tip in the upper trachea. OTHER: None. IMPRESSION: 1. Uniformly hypoattenuating mass filling the vallecula and effacing the piriform sinuses. Vascular malformation or hemangioma are primary considerations. Lingual thyroid tissue could also cause this appearance, but is less likely given the normal thyroid present in this patient and the differences in attenuation between the mass and the thyroid gland. Hemorrhagic neoplasm is a more likely possibility. 2. No cervical lymphadenopathy. 3. Pleural effusions are better assessed on the concomitant chest CT. Satisfactory position of tracheostomy tube. Electronically Signed   By: Ulyses Jarred M.D.   On: 06/02/2017 17:17   Ct Chest W Contrast  Result Date: 06/03/2017 CLINICAL DATA:   Epiglottic mass. Shortness of breath. History of recent fall with right femur fracture. EXAM: CT CHEST WITH CONTRAST TECHNIQUE: Multidetector CT imaging of the chest was performed during intravenous contrast administration. CONTRAST:  5mL ISOVUE-300 IOPAMIDOL (ISOVUE-300) INJECTION 61% COMPARISON:  Chest x-ray 05/29/2017 FINDINGS: Cardiovascular: The heart is normal in size. No pericardial effusion. There is mild tortuosity, ectasia and calcification of the thoracic aorta but no focal aneurysm or dissection. Aortic valve calcifications are noted along with coronary artery calcifications. The pulmonary arteries are grossly normal. Mediastinum/Nodes: No mediastinal or hilar mass or lymphadenopathy. Small scattered lymph nodes are noted. The tracheostomy tube is in good position at the mid tracheal level. The esophagus is grossly normal. There is a small hiatal hernia and possible distal esophageal inflammation. Lungs/Pleura: Moderate breathing motion artifact. There are moderate-sized bilateral pleural effusions with overlying atelectasis. There are underlying emphysematous changes. No worrisome pulmonary lesions. No pulmonary nodules to suggest metastatic disease. Upper Abdomen: Heterogeneous appearance of the liver could be due to geographic fatty infiltration, intrinsic liver disease or vascular congestion. No adenopathy. Diffuse mesenteric edema and small volume ascites suspected. Musculoskeletal: Diffuse body wall edema. Very dense breast tissue but no obvious breast  mass. No supraclavicular or axillary adenopathy. Small thyroid nodules are noted. The tracheostomy tube is in good position. The bony structures are unremarkable.  No worrisome bone lesions. IMPRESSION: 1. Tracheostomy tube in good position at the mid tracheal level. 2. Moderate-sized bilateral pleural effusions with overlying atelectasis. 3. Emphysematous changes but no worrisome pulmonary lesions or pulmonary nodules to suggest metastatic disease.  4. Heterogeneous appearance of the liver, suspect intrinsic liver disease, geographic fatty infiltration or vascular congestion. Recommend correlation with liver function studies. 5. Diffuse body wall edema and mesenteric edema. Aortic Atherosclerosis (ICD10-I70.0) and Emphysema (ICD10-J43.9). Electronically Signed   By: Marijo Sanes M.D.   On: 06/03/2017 11:35   Ct Hip Right Wo Contrast  Result Date: 05/29/2017 CLINICAL DATA:  Right hip pain since a fall 3 days ago. Initial encounter. EXAM: CT OF THE RIGHT HIP WITHOUT CONTRAST TECHNIQUE: Multidetector CT imaging of the right hip was performed according to the standard protocol. Multiplanar CT image reconstructions were also generated. COMPARISON:  Plain films right hip 05/29/2017. FINDINGS: Bones/Joint/Cartilage As seen on the comparison plain films, the patient has an acute right intertrochanteric fracture. Impaction at the medial margin of the fracture of approximately 1 cm is identified. Mild varus angulation is noted. No finding to suggest pathologic fracture is identified. The right femoral head is located. Ligaments Suboptimally assessed by CT. Muscles and Tendons Intact. Soft tissues Imaged intrapelvic contents demonstrate no acute abnormality. IMPRESSION: Acute right intertrochanteric fracture as described above. No other acute abnormality. Electronically Signed   By: Inge Rise M.D.   On: 05/29/2017 13:45   Dg Chest Port 1 View  Result Date: 06/05/2017 CLINICAL DATA:  Dyspnea EXAM: PORTABLE CHEST 1 VIEW COMPARISON:  06/02/2017 FINDINGS: Tracheostomy tube is noted in satisfactory position. Cardiac shadow is within normal limits. Small bilateral pleural effusions are again seen and stable. The degree of right basilar infiltrate has improved slightly in the interval from the prior exam. No new focal infiltrate is seen. IMPRESSION: Small bilateral pleural effusions and right basilar infiltrates slightly improved from the prior exam.  Electronically Signed   By: Inez Catalina M.D.   On: 06/05/2017 08:16   Dg Chest Port 1 View  Result Date: 05/29/2017 CLINICAL DATA:  Respiratory failure.  Tracheostomy check. EXAM: PORTABLE CHEST 1 VIEW COMPARISON:  05/29/2017 at 11:02 a.m. FINDINGS: Tracheostomy has been placed since the earlier exam. Tip projects in the upper thoracic trachea. Cardiac silhouette is normal in size. No mediastinal or hilar masses. Lungs clear allowing for the supine positioning. IMPRESSION: 1. Well-positioned tracheostomy tube. 2. No acute cardiopulmonary disease. Electronically Signed   By: Lajean Manes M.D.   On: 05/29/2017 21:18   Dg Abd Portable 1v  Result Date: 05/30/2017 CLINICAL DATA:  NG tube placement EXAM: PORTABLE ABDOMEN - 1 VIEW COMPARISON:  None. FINDINGS: Enteric tube tip is in the left upper quadrant consistent with location in the upper stomach. Visualized lung bases are clear. Gas and stool throughout the colon. Visualized bowel are not abnormally dilated. IMPRESSION: Enteric tube tip is in the left upper quadrant consistent with location in the upper stomach. Electronically Signed   By: Lucienne Capers M.D.   On: 05/30/2017 00:47   Dg Swallowing Func-speech Pathology  Result Date: 06/01/2017 Objective Swallowing Evaluation: Type of Study: MBS-Modified Barium Swallow Study  Patient Details Name: Kandie Keiper MRN: [id number] Date of Birth: [birth year] Today's Date: 06/01/2017 Time: SLP Start Time (ACUTE ONLY): 1325 -SLP Stop Time (ACUTE ONLY): 1346 SLP Time Calculation (min) (ACUTE ONLY):  21 min Past Medical History: Past Medical History: Diagnosis Date . Acute gallstone pancreatitis 01/08/2014 . AKI (acute kidney injury) ([place]) 01/08/2014 . Anemia  . Blood transfusion   11'15 last admission . Cholecystitis  . Hypercholesteremia  . Hypertension  . Right leg DVT ([place]) 05/08/2014 . Septic shock - Kelbsiella 01/09/2014 . Shortness of breath  . Stroke [facility])   '13- right side weakness, ambulates with cane Past  Surgical History: Past Surgical History: Procedure Laterality Date . ABDOMINAL HYSTERECTOMY   . ENDOSCOPIC RETROGRADE CHOLANGIOPANCREATOGRAPHY (ERCP) WITH PROPOFOL N/A 01/11/2014  Procedure: ENDOSCOPIC RETROGRADE CHOLANGIOPANCREATOGRAPHY (ERCP) WITH PROPOFOL;  Surgeon: Gatha Mayer, MD;  Location: [place];  Service: Endoscopy;  Laterality: N/A; . ERCP   . ERCP N/A 05/26/2014  Procedure: ENDOSCOPIC RETROGRADE CHOLANGIOPANCREATOGRAPHY (ERCP);  Surgeon: Gatha Mayer, MD;  Location: [facility];  Service: Endoscopy;  Laterality: N/A; . ESOPHAGOGASTRODUODENOSCOPY (EGD) WITH PROPOFOL N/A 12/07/2014  Procedure: ESOPHAGOGASTRODUODENOSCOPY (EGD) WITH PROPOFOL;  Surgeon: Milus Banister, MD;  Location[facility];  Service: Endoscopy;  Laterality: N/A; . EUS N/A 12/07/2014  Procedure: UPPER ENDOSCOPIC ULTRASOUND (EUS) LINEAR;  Surgeon: Milus Banister, MD;  Location[facility];  Service: Endoscopy;  Laterality: N/A; . GASTROINTESTINAL STENT REMOVAL N/A 12/07/2014  Procedure: GASTROINTESTINAL STENT REMOVAL;  Surgeon: Milus Banister, MD;  Location[facility];  Service: Endoscopy;  Laterality: N/A;  pancreatic stent removal . TEE WITHOUT CARDIOVERSION  03/06/2011  Procedure: TRANSESOPHAGEAL ECHOCARDIOGRAM (TEE);  Surgeon: Jolaine Artist, MD;  Location: [facility] ENDOSCOPY;  Service: Cardiovascular;  Laterality: N/A; HPI: 76yoF with hx CVA, DVT (2016), HTN, Anemia, Gallstone pancreatitis, and Epiglottis mass, who presented with a hip fracture, now s/p operative repair. Intubated for procedure; postop patient developed respiratory distress and required emergent trach.  Subjective: Family reports weight loss Assessment / Plan / Recommendation CHL IP CLINICAL IMPRESSIONS 06/01/2017 Clinical Impression Pt demonstrates a mild pharyngeal dysphagia in the setting of known epiglottic mass that appears to obstruct the majority of the vestibule. Pt did have some mild penetration around the mass, which was ejected with subsequent  swallows. Mild residuals also observed in pyriforms which cleared with second swallows. Pt is at low risk of aspiration at this phase of her condition given the obstuction of the trachea. Pt may initiate a dys 3 mechanical soft diet (no dentition) and thin liquids until any treatment of mass begins. Radiation or surgery will change function and retesting would be warranted.  SLP Visit Diagnosis Dysphagia, oropharyngeal phase (R13.12) Attention and concentration deficit following -- Frontal lobe and executive function deficit following -- Impact on safety and function Mild aspiration risk   CHL IP TREATMENT RECOMMENDATION 06/01/2017 Treatment Recommendations Therapy as outlined in treatment plan below   Prognosis 06/01/2017 Prognosis for Safe Diet Advancement Good Barriers to Reach Goals -- Barriers/Prognosis Comment -- CHL IP DIET RECOMMENDATION 06/01/2017 SLP Diet Recommendations Dysphagia 3 (Mech soft) solids;Thin liquid Liquid Administration via Cup;Straw Medication Administration Whole meds with puree Compensations -- Postural Changes Seated upright at 90 degrees   CHL IP OTHER RECOMMENDATIONS 06/01/2017 Recommended Consults Consider ENT evaluation Oral Care Recommendations -- Other Recommendations --   CHL IP FOLLOW UP RECOMMENDATIONS 06/01/2017 Follow up Recommendations Outpatient SLP   CHL IP FREQUENCY AND DURATION 06/01/2017 Speech Therapy Frequency (ACUTE ONLY) min 2x/week Treatment Duration 1 week      CHL IP ORAL PHASE 06/01/2017 Oral Phase WFL Oral - Pudding Teaspoon -- Oral - Pudding Cup -- Oral - Honey Teaspoon -- Oral - Honey Cup -- Oral - Nectar  Teaspoon -- Oral - Nectar Cup -- Oral - Nectar Straw -- Oral - Thin Teaspoon -- Oral - Thin Cup -- Oral - Thin Straw -- Oral - Puree -- Oral - Mech Soft -- Oral - Regular -- Oral - Multi-Consistency -- Oral - Pill -- Oral Phase - Comment --  CHL IP PHARYNGEAL PHASE 06/01/2017 Pharyngeal Phase Impaired Pharyngeal- Pudding Teaspoon -- Pharyngeal -- Pharyngeal- Pudding Cup --  Pharyngeal -- Pharyngeal- Honey Teaspoon -- Pharyngeal -- Pharyngeal- Honey Cup -- Pharyngeal -- Pharyngeal- Nectar Teaspoon -- Pharyngeal -- Pharyngeal- Nectar Cup -- Pharyngeal -- Pharyngeal- Nectar Straw -- Pharyngeal -- Pharyngeal- Thin Teaspoon -- Pharyngeal -- Pharyngeal- Thin Cup Pharyngeal residue - pyriform;Penetration/Aspiration during swallow Pharyngeal Material enters airway, remains ABOVE vocal cords then ejected out;Material does not enter airway Pharyngeal- Thin Straw Pharyngeal residue - pyriform;Penetration/Aspiration during swallow Pharyngeal Material does not enter airway;Material enters airway, remains ABOVE vocal cords then ejected out Pharyngeal- Puree Pharyngeal residue - pyriform Pharyngeal -- Pharyngeal- Mechanical Soft -- Pharyngeal -- Pharyngeal- Regular Pharyngeal residue - pyriform Pharyngeal -- Pharyngeal- Multi-consistency -- Pharyngeal -- Pharyngeal- Pill -- Pharyngeal -- Pharyngeal Comment --  No flowsheet data found. No flowsheet data found. DeBlois, Katherene Ponto 06/01/2017, 2:18 PM              Dg C-arm 1-60 Min  Result Date: 05/29/2017 CLINICAL DATA:  Right femoral intramedullary nail fixation. EXAM: RIGHT FEMUR 2 VIEWS; DG C-ARM 61-120 MIN COMPARISON:  Preoperative CT and radiographs from the same day FINDINGS: A total of 48 seconds of fluoroscopic time was utilized for placement of right femoral nail across an intertrochanteric fracture of the right femur. Fine bony detail is limited on the 4 images acquired from the fluoroscopic technique. No intraoperative nor immediate postoperative complications are identified. IMPRESSION: New intramedullary nail fixation of intertrochanteric fracture of the right femur without immediate complications identified. Electronically Signed   By: Ashley Royalty M.D.   On: 05/29/2017 19:45   Dg Hip Unilat W Or Wo Pelvis 2-3 Views Right  Result Date: 05/29/2017 CLINICAL DATA:  Pain following fall EXAM: DG HIP (WITH OR WITHOUT PELVIS) 2-3V RIGHT  COMPARISON:  None. FINDINGS: Frontal pelvis as well as frontal and lateral right hip images were obtained. There is an intertrochanteric fracture of the right femur with varus angulation at the fracture site. No other fracture. No dislocation. There is mild symmetric narrowing of both hip joints. IMPRESSION: Intertrochanteric femur fracture on the right with varus angulation at the fracture site. No other fracture. No dislocation. Mild symmetric narrowing of each hip joint. Electronically Signed   By: Lowella Grip III M.D.   On: 05/29/2017 11:40   Dg Femur, Min 2 Views Right  Result Date: 05/29/2017 CLINICAL DATA:  Right femoral intramedullary nail fixation. EXAM: RIGHT FEMUR 2 VIEWS; DG C-ARM 61-120 MIN COMPARISON:  Preoperative CT and radiographs from the same day FINDINGS: A total of 48 seconds of fluoroscopic time was utilized for placement of right femoral nail across an intertrochanteric fracture of the right femur. Fine bony detail is limited on the 4 images acquired from the fluoroscopic technique. No intraoperative nor immediate postoperative complications are identified. IMPRESSION: New intramedullary nail fixation of intertrochanteric fracture of the right femur without immediate complications identified. Electronically Signed   By: Ashley Royalty M.D.   On: 05/29/2017 19:45      IMPRESSION/PLAN: We will await the conclusion of the patient's consultation with Dr. Vicie Mutters - per family, Dr. Blenda Nicely would like him to consider surgery upfront  for local control of tumor.   I offered the patient medication to help with her chronic depression and she is amenable to trying Citalopram.  Rx provided.  Family agrees this would be a good thing to try. She has no PCP to manage this.  We discussed the potential risks, benefits, and side effects of radiotherapy. She will likely receive it either definitively of adjuvantly over 6-7 weeks.  We talked in detail about acute and late effects. We discussed that  some of the most bothersome acute effects may be mucositis, dysgeusia, salivary changes, skin irritation, hair loss, dehydration, weight loss and fatigue. We talked about late effects which include but are not necessarily limited to dysphagia, hypothyroidism, nerve injury, spinal cord injury, xerostomia, trismus, and neck edema. No guarantees of treatment were given. A consent form was signed and placed in the patient's medical record. The patient is enthusiastic about proceeding with treatment. I look forward to participating in the patient's care.    This timing of simulation (treatment planning) will  depend  on surgical disposition - we will await her referral back from ENT. [name], RN, our Head and Neck Oncology Navigator will navigate her in this process and be in touch with the [facility] team.  I spent 75 minutes face to face with the patient, over 50% of the time on counseling and care coordination. __________________________________________   Eppie Gibson, MD  This document serves as a record of services personally performed by Eppie Gibson, MD. It was created on his behalf by Linward Natal, a trained medical scribe. The creation of this record is based on the scribe's personal observations and the provider's statements to them. This document has been checked and approved by the attending provider.

## 2017-06-23 NOTE — Addendum Note (Signed)
Encounter addended by: Ayra Hodgdon, Stephani Police, RN on: 06/23/2017 1:45 PM  Actions taken: Visit diagnoses modified

## 2017-06-24 ENCOUNTER — Encounter: Payer: Self-pay | Admitting: Radiation Oncology

## 2017-06-24 DIAGNOSIS — I5032 Chronic diastolic (congestive) heart failure: Secondary | ICD-10-CM | POA: Diagnosis not present

## 2017-06-24 DIAGNOSIS — C321 Malignant neoplasm of supraglottis: Secondary | ICD-10-CM | POA: Diagnosis not present

## 2017-06-24 DIAGNOSIS — C139 Malignant neoplasm of hypopharynx, unspecified: Secondary | ICD-10-CM | POA: Insufficient documentation

## 2017-06-24 DIAGNOSIS — F418 Other specified anxiety disorders: Secondary | ICD-10-CM | POA: Diagnosis not present

## 2017-06-24 DIAGNOSIS — I129 Hypertensive chronic kidney disease with stage 1 through stage 4 chronic kidney disease, or unspecified chronic kidney disease: Secondary | ICD-10-CM | POA: Diagnosis not present

## 2017-06-24 NOTE — Progress Notes (Signed)
[facility] New Visit:  Assessment: Cancer of hypopharynx ([place]) 67 y.o. 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  Presentation is complicated by profound deconditioning and dysfunction of the patient.  Presentation is complicated by severe deconditioning and physical dysfunction of the patient with likely psychological elements driving the presentation.  It appears that patient has been self-neglecting for a long period of time leading to profound cachexia and locally advanced malignancy.  At this time, due to malnutrition, recent femoral fracture, and poor performance status, patient is not a candidate for any systemic therapy including not being a candidate for cetuximab or checkpoint inhibitors.  I would leave the decision regarding eligibility of the patient for surgical or radiation treatment to the respective providers.  Plan: - No active systemic therapy due to patient's condition. -Agree with the plan to refer patient to [facility] for surgical assessment -Return to our clinic as needed.   Voice recognition software was used and creation of this note. Despite my best effort at editing the text, some misspelling/errors may have occurred. No orders of the defined types were placed in this encounter.   All questions were answered. . The patient knows to call the clinic with any problems, questions or concerns.  This note was electronically signed.    History of Presenting Illness Katelyn [name] 67 y.o. presenting to the [facility] for recent diagnosis of squamous cell carcinoma of the hypopharynx, evaluated as a part of multimodality assessment.  Patient's past medical history is significant for gallstone pancreatitis, deep vein thrombosis in the lower extremities in 2016, hypertension, and cerebrovascular accident with residual right-sided weakness. Patient was  admitted on 05/29/17 after she fell 2 days prior and has been unable to walk for 2 days due to pain in the right hip.  On admission, patient was found to have an intertrochanteric fracture of the right femur and she underwent intramedullary nail placement to stabilize a fracture.  Due to deconditioning, patient was placed in skilled nursing facility for rehabilitation.  During assessment, patient was found to have a large mass in the hypopharynx compromising the airway and underwent placement of tracheostomy as well as the biopsy results of which are below.  From the family, it appears that she was told about the presence of the abnormality sometime ago, but chose not to follow-up with anyone regarding the issue.  It appears that patient has not been providing any self-care for an extended period of time with poor appetite, lack of interest in activities of daily living.  She resides in a house with her boyfriend who has been assisting her with food preparation and hygiene.  Additional patient's symptoms include voice change, difficulty swallowing, severe loss of appetite.  Patient has had no abdominal pain, nausea, or diarrhea.  She has been experiencing shortness of breath at rest without chest pain or palpitations.Patient has extensive smoking history, but reported not to drink alcohol to excess.  Oncological/hematological History:   Cancer of hypopharynx ([place])   06/02/2017 Imaging    CT Neck/Chest: 3.8 cm hyperdense mass anterior to the epiglottis with no pathological cervical lymphadenopathy. No pulmonary nodules.  Heterogeneous appearance of the liver suggestive of hepatocellular disease/cirrhosis.      06/08/2017 Pathology Results    Epiglottic mass biopsy: Invasive squamous cell carcinoma      06/08/2017 Cancer Staging    Staging form: Pharynx - Hypopharynx, AJCC 8th Edition - Clinical stage from  06/08/2017: Stage II (cT2, cN0, cM0) - Signed by Ardath Sax, MD on 06/24/2017        Medical History: Past Medical History:  Diagnosis Date  . Acute gallstone pancreatitis 01/08/2014  . AKI (acute kidney injury) ([place]) 01/08/2014  . Anemia   . Blood transfusion    11'15 last admission  . Cholecystitis   . Hypercholesteremia   . Hypertension   . Right leg DVT ([place]) 05/08/2014  . Septic shock - Kelbsiella 01/09/2014  . Shortness of breath   . Stroke [facility])    '13- right side weakness, ambulates with cane    Surgical History: Past Surgical History:  Procedure Laterality Date  . ABDOMINAL HYSTERECTOMY    . DIRECT LARYNGOSCOPY N/A 06/08/2017   Procedure: DIRECT LARYNGOSCOPY WITH BIOPSY;  Surgeon: Helayne Seminole, MD;  [facility];  Service: ENT;  Laterality: N/A;  . ENDOSCOPIC RETROGRADE CHOLANGIOPANCREATOGRAPHY (ERCP) WITH PROPOFOL N/A 01/11/2014   Procedure: ENDOSCOPIC RETROGRADE CHOLANGIOPANCREATOGRAPHY (ERCP) WITH PROPOFOL;  Surgeon: Gatha Mayer, MD;  Location: [facility];  Service: Endoscopy;  Laterality: N/A;  . ERCP    . ERCP N/A 05/26/2014   Procedure: ENDOSCOPIC RETROGRADE CHOLANGIOPANCREATOGRAPHY (ERCP);  Surgeon: Gatha Mayer, MD;  Location: [facility] ENDOSCOPY;  Service: Endoscopy;  Laterality: N/A;  . ESOPHAGOGASTRODUODENOSCOPY (EGD) WITH PROPOFOL N/A 12/07/2014   Procedure: ESOPHAGOGASTRODUODENOSCOPY (EGD) WITH PROPOFOL;  Surgeon: Milus Banister, MD;  Location: [facility] ENDOSCOPY;  Service: Endoscopy;  Laterality: N/A;  . EUS N/A 12/07/2014   Procedure: UPPER ENDOSCOPIC ULTRASOUND (EUS) LINEAR;  Surgeon: Milus Banister, MD;  Location: [facility] ENDOSCOPY;  Service: Endoscopy;  Laterality: N/A;  . GASTROINTESTINAL STENT REMOVAL N/A 12/07/2014   Procedure: GASTROINTESTINAL STENT REMOVAL;  Surgeon: Milus Banister, MD;  Location: [facility] ENDOSCOPY;  Service: Endoscopy;  Laterality: N/A;  pancreatic stent removal  . INTRAMEDULLARY (IM) NAIL INTERTROCHANTERIC Right 05/29/2017   Procedure: INTRAMEDULLARY (IM) NAIL INTERTROCHANTRIC FEMUR;  Surgeon: Nicholes Stairs,  MD;  Location: [facility];  Service: Orthopedics;  Laterality: Right;  . PERCUTANEOUS TRACHEOSTOMY N/A 05/29/2017   Procedure: PERCUTANEOUS TRACHEOSTOMY;  Surgeon: Judeth Horn, MD;  Location: [place] [facility];  Service: General;  Laterality: N/A;  . TEE WITHOUT CARDIOVERSION  03/06/2011   Procedure: TRANSESOPHAGEAL ECHOCARDIOGRAM (TEE);  Surgeon: Jolaine Artist, MD;  Location: [facility] ENDOSCOPY;  Service: Cardiovascular;  Laterality: N/A;    Family History: Family History  Problem Relation Age of Onset  . Breast cancer Maternal Aunt   . Diabetes Mellitus I Father   . Diabetes Mellitus I Sister   . Anesthesia problems Neg Hx   . Hypotension Neg Hx   . Malignant hyperthermia Neg Hx   . Pseudochol deficiency Neg Hx   . Colon cancer Neg Hx   . Esophageal cancer Neg Hx   . Pancreatic cancer Neg Hx   . Kidney disease Neg Hx   . Liver disease Neg Hx     Social History: Social History   Socioeconomic History  . Marital status: Widowed    Spouse name: Not on file  . Number of children: Not on file  . Years of education: Not on file  . Highest education level: Not on file  Occupational History  . Occupation: Retired  Scientific laboratory technician  . Financial resource strain: Not on file  . Food insecurity:    Worry: Not on file    Inability: Not on file  . Transportation needs:    Medical: Not on file    Non-medical: Not on file  Tobacco  Use  . Smoking status: Former Smoker    Packs/day: 0.25    Types: Cigarettes    Last attempt to quit: 05/29/2017    Years since quitting: 0.0  . Smokeless tobacco: Never Used  Substance and Sexual Activity  . Alcohol use: No    Alcohol/week: 0.0 oz  . Drug use: No  . Sexual activity: Not on file  Lifestyle  . Physical activity:    Days per week: Not on file    Minutes per session: Not on file  . Stress: Not on file  Relationships  . Social connections:    Talks on phone: Not on file    Gets together: Not on file    Attends religious service: Not on file    Active  member of club or organization: Not on file    Attends meetings of clubs or organizations: Not on file    Relationship status: Not on file  . Intimate partner violence:    Fear of current or ex partner: Not on file    Emotionally abused: Not on file    Physically abused: Not on file    Forced sexual activity: Not on file  Other Topics Concern  . Not on file  Social History Narrative  . Not on file    Allergies: No Known Allergies  Medications:  Current Outpatient Medications  Medication Sig Dispense Refill  . acetaminophen (TYLENOL) 325 MG tablet Take by mouth as needed for mild pain or headache.    . citalopram (CELEXA) 10 MG/5ML suspension Take 10 mLs (20 mg total) by mouth daily. 300 mL 1  . oxycodone (OXY-IR) 5 MG capsule Take 10 mg by mouth every 4 (four) hours as needed. 1-2 tablets every 6 hours as needed for pain    . polyethylene glycol (MIRALAX / GLYCOLAX) packet Take 17 g by mouth daily as needed for mild constipation.    . sennosides-docusate sodium (SENOKOT-S) 8.6-50 MG tablet Take 2 tablets by mouth daily. Every HS prn constipation     No current facility-administered medications for this visit.     Review of Systems: Review of Systems  Constitutional: Positive for appetite change, fatigue and unexpected weight change.  HENT:   Positive for lump/mass, sore throat, trouble swallowing and voice change. Negative for hearing loss, mouth sores and nosebleeds.   Respiratory: Positive for cough. Negative for hemoptysis.   Musculoskeletal: Positive for arthralgias and back pain. Negative for neck pain and neck stiffness.  All other systems reviewed and are negative.    PHYSICAL EXAMINATION There were no vitals taken for this visit.  ECOG PERFORMANCE STATUS: 4 - Bedbound  Physical Exam  Constitutional: She is oriented to person, place, and time. No distress.  Cachectic female appearing significantly older than his stated age.  HENT:  Head: Normocephalic.   Mouth/Throat: Oropharynx is clear and moist. No oropharyngeal exudate.  Bitemporal muscle wasting.  Eyes: Pupils are equal, round, and reactive to light. Conjunctivae and EOM are normal. No scleral icterus.  Neck: No thyromegaly present.  Tracheostomy in place  Cardiovascular: Normal rate, regular rhythm, normal heart sounds and intact distal pulses.  No murmur heard. Pulmonary/Chest: Effort normal and breath sounds normal. No stridor. No respiratory distress. She has no wheezes. She has no rales.  Abdominal: Soft. Bowel sounds are normal. She exhibits no distension and no mass. There is no tenderness. There is no guarding.  Musculoskeletal: She exhibits no edema.  Lymphadenopathy:    She has cervical adenopathy.  Neurological:  She is alert and oriented to person, place, and time. She displays normal reflexes. No cranial nerve deficit or sensory deficit.  Patient has limited ability to verbalize due to presence of tracheostomy and dysphonia  Skin: Skin is dry. No rash noted. She is not diaphoretic. No erythema. There is pallor.     LABORATORY DATA: I have personally reviewed the data as listed: No visits with results within 1 Week(s) from this visit.  Latest known visit with results is:  Admission on 05/29/2017, Discharged on 06/11/2017  No results displayed because visit has over 200 results.           Ardath Sax, MD

## 2017-06-24 NOTE — Assessment & Plan Note (Signed)
67 y.o. 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  Presentation is complicated by profound deconditioning and dysfunction of the patient.  Presentation is complicated by severe deconditioning and physical dysfunction of the patient with likely psychological elements driving the presentation.  It appears that patient has been self-neglecting for a long period of time leading to profound cachexia and locally advanced malignancy.  At this time, due to malnutrition, recent femoral fracture, and poor performance status, patient is not a candidate for any systemic therapy including not being a candidate for cetuximab or checkpoint inhibitors.  I would leave the decision regarding eligibility of the patient for surgical or radiation treatment to the respective providers.  Plan: - No active systemic therapy due to patient's condition. -Agree with the plan to refer patient to [facility] for surgical assessment -Return to our clinic as needed.

## 2017-06-25 ENCOUNTER — Telehealth: Payer: Self-pay | Admitting: Hematology and Oncology

## 2017-06-25 NOTE — Progress Notes (Signed)
Oncology Nurse Navigator Documentation  Met with Ms. Bickham and her family during consult with Dr. Lebron Conners. He indicated/explained systemic therapy is not a tmt option based on her historical and current physical/performance status.   He encouraged pt to participate in PT provided at [facility] to gain strength/mobility s/p hip surgery as well as to improve her overall wellness in anticipation of ENT surgery and possibly RT.  Pt and family voiced understanding. I encouraged them to call me with questions/concerns.  [name], RN, BSN Head & Neck Oncology Nurse [place] at [place] [phone]

## 2017-06-25 NOTE — Progress Notes (Signed)
Oncology Nurse Navigator Documentation  Met with Katelyn [name] during initial consult with Dr. Isidore Moos.  She arrived via [facility] from [facility] where she is staying for rehab s/p hip surgery.  She was accompanied by 2 dtrs and several other family members.  We had previously met during her [facility] admission.   1. Further introduced myself as her Navigator, explained my role as a member of the Care Team.   2. Provided New Patient Information packet, discussed contents:  Contact information for physician(s), myself, other members of the Care Team.  Advance Directive information ([place] blue pamphlet with LCSW contact info)  Fall Prevention Patient Safety Plan  Appointment [facility] sheet  [facility] map with highlight of [place]  SLP information sheet 3. Provided introductory explanation of radiation treatment including SIM planning and purpose of Aquaplast head and shoulder mask, showed them example.   4. Notes:  She lives at home with her boyfriend.  Is edentulous  Has been referred to Dr. Vicie Mutters, [facility], for surgical consult 5/8.  Reports claustrophobia. 5.  [name] and family expressed understanding RT may be recommended post-surgery. They verbalized understanding of information provided.    Katelyn Orem, RN, BSN Head & Neck Oncology Nurse [facility] at [place] [phone]

## 2017-06-25 NOTE — Telephone Encounter (Signed)
Return if symptoms worsen or fail to improve per 4/30 los

## 2017-06-26 DIAGNOSIS — R5381 Other malaise: Secondary | ICD-10-CM | POA: Diagnosis not present

## 2017-06-26 DIAGNOSIS — Z471 Aftercare following joint replacement surgery: Secondary | ICD-10-CM | POA: Diagnosis not present

## 2017-06-26 DIAGNOSIS — K5909 Other constipation: Secondary | ICD-10-CM | POA: Diagnosis not present

## 2017-07-01 DIAGNOSIS — C320 Malignant neoplasm of glottis: Secondary | ICD-10-CM | POA: Diagnosis not present

## 2017-07-01 DIAGNOSIS — J9601 Acute respiratory failure with hypoxia: Secondary | ICD-10-CM | POA: Diagnosis not present

## 2017-07-01 DIAGNOSIS — Z930 Tracheostomy status: Secondary | ICD-10-CM | POA: Diagnosis not present

## 2017-07-01 DIAGNOSIS — Z471 Aftercare following joint replacement surgery: Secondary | ICD-10-CM | POA: Diagnosis not present

## 2017-07-01 DIAGNOSIS — C321 Malignant neoplasm of supraglottis: Secondary | ICD-10-CM | POA: Diagnosis not present

## 2017-07-02 ENCOUNTER — Telehealth: Payer: Self-pay | Admitting: *Deleted

## 2017-07-02 NOTE — Telephone Encounter (Signed)
Oncology Nurse Navigator Documentation  Rec'd call from pt's dtr [name] s/p yesterday's consult with Dr. Vicie Mutters, [facility].  Her mother has decided to proceed with surgery; date pending. She noted Dr. Vicie Mutters recommended PEG placement ASAP to improve nutritional status prior to surgery.  [name] indicated her mother has not agreed to PEG yet but family is providing encouragement to do so.  Drs. Isidore Moos and [employer] notified.  [name], RN, BSN Head & Neck Oncology Nurse [place] at [place] [phone]

## 2017-07-03 DIAGNOSIS — R400 Somnolence: Secondary | ICD-10-CM | POA: Diagnosis not present

## 2017-07-03 DIAGNOSIS — C321 Malignant neoplasm of supraglottis: Secondary | ICD-10-CM | POA: Diagnosis not present

## 2017-07-03 DIAGNOSIS — Z471 Aftercare following joint replacement surgery: Secondary | ICD-10-CM | POA: Diagnosis not present

## 2017-07-06 DIAGNOSIS — Z471 Aftercare following joint replacement surgery: Secondary | ICD-10-CM | POA: Diagnosis not present

## 2017-07-06 DIAGNOSIS — C321 Malignant neoplasm of supraglottis: Secondary | ICD-10-CM | POA: Diagnosis not present

## 2017-07-06 DIAGNOSIS — R400 Somnolence: Secondary | ICD-10-CM | POA: Diagnosis not present

## 2017-07-08 ENCOUNTER — Non-Acute Institutional Stay: Payer: Self-pay | Admitting: Internal Medicine

## 2017-07-08 VITALS — HR 84

## 2017-07-09 DIAGNOSIS — R000 Tachycardia, unspecified: Secondary | ICD-10-CM | POA: Diagnosis not present

## 2017-07-10 ENCOUNTER — Emergency Department (HOSPITAL_COMMUNITY): Payer: Medicare HMO

## 2017-07-10 ENCOUNTER — Emergency Department (HOSPITAL_COMMUNITY)
Admission: EM | Admit: 2017-07-10 | Discharge: 2017-07-10 | Disposition: A | Discharge status: Home / Self Care | Payer: Medicare HMO | Attending: Emergency Medicine | Admitting: Emergency Medicine

## 2017-07-10 ENCOUNTER — Encounter (HOSPITAL_COMMUNITY): Payer: Self-pay

## 2017-07-10 DIAGNOSIS — Z87891 Personal history of nicotine dependence: Secondary | ICD-10-CM | POA: Diagnosis not present

## 2017-07-10 DIAGNOSIS — R9431 Abnormal electrocardiogram [ECG] [EKG]: Secondary | ICD-10-CM | POA: Diagnosis not present

## 2017-07-10 DIAGNOSIS — I129 Hypertensive chronic kidney disease with stage 1 through stage 4 chronic kidney disease, or unspecified chronic kidney disease: Secondary | ICD-10-CM | POA: Diagnosis not present

## 2017-07-10 DIAGNOSIS — N390 Urinary tract infection, site not specified: Secondary | ICD-10-CM | POA: Diagnosis not present

## 2017-07-10 DIAGNOSIS — Z79899 Other long term (current) drug therapy: Secondary | ICD-10-CM | POA: Diagnosis not present

## 2017-07-10 DIAGNOSIS — R000 Tachycardia, unspecified: Secondary | ICD-10-CM | POA: Diagnosis not present

## 2017-07-10 DIAGNOSIS — N183 Chronic kidney disease, stage 3 (moderate): Secondary | ICD-10-CM | POA: Insufficient documentation

## 2017-07-10 DIAGNOSIS — Z7401 Bed confinement status: Secondary | ICD-10-CM | POA: Diagnosis not present

## 2017-07-10 DIAGNOSIS — R509 Fever, unspecified: Secondary | ICD-10-CM

## 2017-07-10 DIAGNOSIS — M2550 Pain in unspecified joint: Secondary | ICD-10-CM | POA: Diagnosis not present

## 2017-07-10 LAB — COMPREHENSIVE METABOLIC PANEL
ALT: 9 U/L — ABNORMAL LOW (ref 14–54)
ANION GAP: 9 (ref 5–15)
AST: 22 U/L (ref 15–41)
Albumin: 2.7 g/dL — ABNORMAL LOW (ref 3.5–5.0)
Alkaline Phosphatase: 108 U/L (ref 38–126)
BUN: 10 mg/dL (ref 6–20)
CO2: 29 mmol/L (ref 22–32)
Calcium: 9.6 mg/dL (ref 8.9–10.3)
Chloride: 100 mmol/L — ABNORMAL LOW (ref 101–111)
Creatinine, Ser: 0.74 mg/dL (ref 0.44–1.00)
GFR calc non Af Amer: >60 mL/min (ref >60)
GLUCOSE: 103 mg/dL — AB (ref 65–99)
POTASSIUM: 4.2 mmol/L (ref 3.5–5.1)
SODIUM: 138 mmol/L (ref 135–145)
TOTAL PROTEIN: 7.8 g/dL (ref 6.5–8.1)
Total Bilirubin: 0.8 mg/dL (ref 0.3–1.2)

## 2017-07-10 LAB — URINALYSIS, ROUTINE W REFLEX MICROSCOPIC
GLUCOSE, UA: NEGATIVE mg/dL
Hgb urine dipstick: NEGATIVE
KETONES UR: NEGATIVE mg/dL
Nitrite: NEGATIVE
PH: 5 (ref 5.0–8.0)
Protein, ur: 100 mg/dL — AB
Specific Gravity, Urine: 1.025 (ref 1.005–1.030)

## 2017-07-10 LAB — I-STAT CG4 LACTIC ACID, ED: LACTIC ACID, VENOUS: 1.44 mmol/L (ref 0.5–1.9)

## 2017-07-10 LAB — CBC WITH DIFFERENTIAL/PLATELET
Abs Immature Granulocytes: 0.1 10*3/uL (ref 0.0–0.1)
BASOS ABS: 0 10*3/uL (ref 0.0–0.1)
Basophils Relative: 0 %
Eosinophils Absolute: 0 10*3/uL (ref 0.0–0.7)
Eosinophils Relative: 0 %
HCT: 31.1 % — ABNORMAL LOW (ref 36.0–46.0)
HEMOGLOBIN: 9.3 g/dL — AB (ref 12.0–15.0)
Immature Granulocytes: 0 %
LYMPHS PCT: 19 %
Lymphs Abs: 2.3 10*3/uL (ref 0.7–4.0)
MCH: 27 pg (ref 26.0–34.0)
MCHC: 29.9 g/dL — ABNORMAL LOW (ref 30.0–36.0)
MCV: 90.4 fL (ref 78.0–100.0)
MONO ABS: 1.2 10*3/uL — AB (ref 0.1–1.0)
Monocytes Relative: 10 %
NEUTROS ABS: 8.4 10*3/uL — AB (ref 1.7–7.7)
Neutrophils Relative %: 71 %
Platelets: 344 10*3/uL (ref 150–400)
RBC: 3.44 MIL/uL — ABNORMAL LOW (ref 3.87–5.11)
RDW: 17.1 % — ABNORMAL HIGH (ref 11.5–15.5)
WBC: 12 10*3/uL — ABNORMAL HIGH (ref 4.0–10.5)

## 2017-07-10 MED ORDER — SULFAMETHOXAZOLE-TRIMETHOPRIM 800-160 MG PO TABS: 1.0000 | ORAL_TABLET | Freq: Two times a day (BID) | ORAL | 0 refills | Status: AC

## 2017-07-10 MED ORDER — SULFAMETHOXAZOLE-TRIMETHOPRIM 800-160 MG PO TABS
1.0000 | ORAL_TABLET | Freq: Once | ORAL | Status: AC
  Administered 2017-07-10: 1 via ORAL
  Filled 2017-07-10: qty 1

## 2017-07-10 NOTE — ED Triage Notes (Signed)
Pt comes from [facility] by [facility] for fever, chills, pt has a trach, rhonchi in all lobes. Denies CP/SOB

## 2017-07-10 NOTE — Discharge Instructions (Signed)
As discussed, your evaluation today has been largely reassuring.  But, it is important that you monitor your condition carefully, and do not hesitate to return to the ED if you develop new, or concerning changes in your condition. ? ?Otherwise, please follow-up with your physician for appropriate ongoing care. ? ?

## 2017-07-10 NOTE — ED Provider Notes (Signed)
[facility] EMERGENCY DEPARTMENT Provider Note   CSN: [id number] Arrival date & time: 07/10/17  0047     History   Chief Complaint Chief Complaint  Patient presents with  . Fever    HPI Katelyn [name] is a 67 y.o. female.  HPI Patient presents with her daughter who assists with the HPI. Patient has substantial medical problems, lives in a nursing facility. She has tracheostomy, had a fall last month with right hip fracture. However, she was doing generally well until last couple days, with daughter reports the patient had intermittent hot and cold episodes. Today, with audible abnormal breath sounds, and possible fever, the patient was sent here for evaluation. The patient herself does answer questions, briefly, but daughter provides much of the details of her presentation. they also note that the patient has increasing weakness, anorexia, but no vomiting.  Past Medical History:  Diagnosis Date  . Acute gallstone pancreatitis 01/08/2014  . AKI (acute kidney injury) ([place]) 01/08/2014  . Anemia   . Blood transfusion    11'15 last admission  . Cholecystitis   . Hypercholesteremia   . Hypertension   . Right leg DVT ([place]) 05/08/2014  . Septic shock - Kelbsiella 01/09/2014  . Shortness of breath   . Stroke [facility])    '13- right side weakness, ambulates with cane    Patient Active Problem List   Diagnosis Date Noted  . 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 Pressure injury of skin 05/30/2017  . Malnutrition of moderate degree 05/30/2017  . Hip fx, right, closed, initial encounter ([place]) 05/29/2017  . Closed displaced intertrochanteric fracture of right femur ([place]) 05/29/2017  . Dysphagia 05/29/2017  . Weight loss 05/29/2017  . Difficult airway for intubation 05/29/2017  . Lesion of epiglottis 05/26/2014  . Abnormal CT scan, pancreas or bile duct   . Right leg DVT ([place]) 05/08/2014  . Anemia 01/09/2014  . Hip pain, right 05/06/2011  . 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 CKD (chronic kidney disease) stage 3, GFR 30-59 ml/min (HCC) 03/04/2011  . Diastolic dysfunction 78/46/9629  . Tobacco abuse 03/04/2011  . Constipation 03/04/2011  . Malignant hypertensive urgency 03/01/2011  . CVA (cerebral infarction) 03/01/2011  . NEUROPATHY, IDIOPATHIC 10/25/2007  . Hypercholesteremia 12/15/2006  . Essential hypertension 12/15/2006    Past Surgical History:  Procedure Laterality Date  . ABDOMINAL HYSTERECTOMY    . DIRECT LARYNGOSCOPY N/A 06/08/2017   Procedure: DIRECT LARYNGOSCOPY WITH BIOPSY;  Surgeon: Helayne Seminole, MD;  Location: [facility] OR;  Service: ENT;  Laterality: N/A;  . ENDOSCOPIC RETROGRADE CHOLANGIOPANCREATOGRAPHY (ERCP) WITH PROPOFOL N/A 01/11/2014   Procedure: ENDOSCOPIC RETROGRADE CHOLANGIOPANCREATOGRAPHY (ERCP) WITH PROPOFOL;  Surgeon: Gatha Mayer, MD;  Location: [place];  Service: Endoscopy;  Laterality: N/A;  . ERCP    . ERCP N/A 05/26/2014   Procedure: ENDOSCOPIC RETROGRADE CHOLANGIOPANCREATOGRAPHY (ERCP);  Surgeon: Gatha Mayer, MD;  Location: [facility];  Service: Endoscopy;  Laterality: N/A;  . ESOPHAGOGASTRODUODENOSCOPY (EGD) WITH PROPOFOL N/A 12/07/2014   Procedure: ESOPHAGOGASTRODUODENOSCOPY (EGD) WITH PROPOFOL;  Surgeon: Milus Banister, MD;  Location: [facility] ENDOSCOPY;  Service: Endoscopy;  Laterality: N/A;  . EUS N/A 12/07/2014   Procedure: UPPER ENDOSCOPIC ULTRASOUND (EUS) LINEAR;  Surgeon: Milus Banister, MD;  Location: [facility] ENDOSCOPY;  Service: Endoscopy;  Laterality: N/A;  . GASTROINTESTINAL STENT REMOVAL N/A 12/07/2014   Procedure: GASTROINTESTINAL STENT REMOVAL;  Surgeon: Milus Banister, MD;  Location: [facility] ENDOSCOPY;  Service: Endoscopy;  Laterality: N/A;  pancreatic stent removal  .  INTRAMEDULLARY (IM) NAIL INTERTROCHANTERIC Right 05/29/2017   Procedure: INTRAMEDULLARY (IM) NAIL INTERTROCHANTRIC FEMUR;  Surgeon: Nicholes Stairs, MD;  Location: [place];  Service: Orthopedics;   Laterality: Right;  . PERCUTANEOUS TRACHEOSTOMY N/A 05/29/2017   Procedure: PERCUTANEOUS TRACHEOSTOMY;  Surgeon: Judeth Horn, MD;  Location: [place];  Service: General;  Laterality: N/A;  . TEE WITHOUT CARDIOVERSION  03/06/2011   Procedure: TRANSESOPHAGEAL ECHOCARDIOGRAM (TEE);  Surgeon: Jolaine Artist, MD;  Location: [facility] ENDOSCOPY;  Service: Cardiovascular;  Laterality: N/A;     OB History   None      Home Medications    Prior to Admission medications   Medication Sig Start Date End Date Taking? Authorizing Provider  acetaminophen (TYLENOL) 325 MG tablet Take 162.5-325 mg by mouth as needed for mild pain or headache.    Yes [provider]  ALPRAZolam (XANAX) 0.25 MG tablet Take 0.25 mg by mouth as needed for anxiety or sleep.   Yes [provider]  citalopram (CELEXA) 10 MG/5ML suspension Take 10 mLs (20 mg total) by mouth daily. 06/23/17  Yes Eppie Gibson, MD  oxycodone (OXY-IR) 5 MG capsule Take 5-10 mg by mouth every 4 (four) hours as needed for pain.    Yes [provider]  polyethylene glycol (MIRALAX / GLYCOLAX) packet Take 17 g by mouth daily as needed for mild constipation.   Yes [provider]  sennosides-docusate sodium (SENOKOT-S) 8.6-50 MG tablet Take 2 tablets by mouth at bedtime.    Yes [provider]  sulfamethoxazole-trimethoprim (BACTRIM DS,SEPTRA DS) 800-160 MG tablet Take 1 tablet by mouth 2 (two) times daily for 7 days. 07/10/17 07/17/17  Carmin Muskrat, MD    Family History Family History  Problem Relation Age of Onset  . Breast cancer Maternal Aunt   . Diabetes Mellitus I Father   . Diabetes Mellitus I Sister   . Anesthesia problems Neg Hx   . Hypotension Neg Hx   . Malignant hyperthermia Neg Hx   . Pseudochol deficiency Neg Hx   . Colon cancer Neg Hx   . Esophageal cancer Neg Hx   . Pancreatic cancer Neg Hx   . Kidney disease Neg Hx   . Liver disease Neg Hx     Social History Social History   Tobacco  Use  . Smoking status: Former Smoker    Packs/day: 0.25    Types: Cigarettes    Last attempt to quit: 05/29/2017    Years since quitting: 0.1  . Smokeless tobacco: Never Used  Substance Use Topics  . Alcohol use: No    Alcohol/week: 0.0 oz  . Drug use: No     Allergies   Patient has no known allergies.   Review of Systems Review of Systems  Constitutional:       Per HPI, otherwise negative  HENT:       Per HPI, otherwise negative  Respiratory:       Per HPI, otherwise negative  Cardiovascular:       Per HPI, otherwise negative  Gastrointestinal: Negative for vomiting.  Endocrine:       Negative aside from HPI  Genitourinary:       Neg aside from HPI   Musculoskeletal:       Per HPI, otherwise negative  Skin: Negative.   Neurological: Positive for weakness. Negative for syncope.     Physical Exam Updated Vital Signs BP 130/87 (BP Location: Right Arm)   Pulse 86   Temp 100 F (37.8 C) (Rectal)  Resp 20   SpO2 100%   Physical Exam  Constitutional: She is oriented to person, place, and time. She has a sickly appearance.  Frail, ill-appearing female with trach collar in place  HENT:  Head: Normocephalic and atraumatic.  Eyes: Conjunctivae and EOM are normal.  Neck:    Cardiovascular: Normal rate and regular rhythm.  Pulmonary/Chest:  Coarse breath sounds throughout  Abdominal: She exhibits no distension.  Musculoskeletal: She exhibits no edema.  Neurological: She is alert and oriented to person, place, and time. She displays atrophy.  Skin: Skin is warm and dry.  Psychiatric: She has a normal mood and affect. Her speech is delayed.  Nursing note and vitals reviewed.    ED Treatments / Results  Labs (all labs ordered are listed, but only abnormal results are displayed) Labs Reviewed  COMPREHENSIVE METABOLIC PANEL - Abnormal; Notable for the following components:      Result Value   Chloride 100 (*)    Glucose, Bld 103 (*)    Albumin 2.7 (*)     ALT 9 (*)    All other components within normal limits  CBC WITH DIFFERENTIAL/PLATELET - Abnormal; Notable for the following components:   WBC 12.0 (*)    RBC 3.44 (*)    Hemoglobin 9.3 (*)    HCT 31.1 (*)    MCHC 29.9 (*)    RDW 17.1 (*)    Neutro Abs 8.4 (*)    Monocytes Absolute 1.2 (*)    All other components within normal limits  URINALYSIS, ROUTINE W REFLEX MICROSCOPIC - Abnormal; Notable for the following components:   APPearance HAZY (*)    Bilirubin Urine SMALL (*)    Protein, ur 100 (*)    Leukocytes, UA TRACE (*)    Bacteria, UA MANY (*)    All other components within normal limits  I-STAT CG4 LACTIC ACID, ED    EKG EKG Interpretation  Date/Time:  Friday Jul 10 2017 00:51:23 EDT Ventricular Rate:  116 PR Interval:    QRS Duration: 114 QT Interval:  300 QTC Calculation: 417 R Axis:   71 Text Interpretation:  Sinus tachycardia Borderline intraventricular conduction delay ST-t wave abnormality Artifact in lead(s) III aVR aVF V1 V2 V6 No significant change since last tracing Abnormal ekg Confirmed by Carmin Muskrat [phone]) on 07/10/2017 1:19:30 AM   Radiology Dg Chest Portable 1 View  Result Date: 07/10/2017 CLINICAL DATA:  67 y/o  F; fever. EXAM: PORTABLE CHEST 1 VIEW COMPARISON:  06/05/2017 chest radiograph. FINDINGS: Stable cardiac silhouette within normal limits given projection and technique. Aortic atherosclerosis with calcification. Clear lungs. No pleural effusion or pneumothorax. No acute osseous abnormality is evident. Tracheostomy tube in situ. IMPRESSION: No active disease. Electronically Signed   By: Kristine Garbe M.D.   On: 07/10/2017 01:15    Procedures Procedures (including critical care time)  Medications Ordered in ED Medications  sulfamethoxazole-trimethoprim (BACTRIM DS,SEPTRA DS) 800-160 MG per tablet 1 tablet (has no administration in time range)     Initial Impression / Assessment and Plan / ED Course  I have reviewed the  triage vital signs and the nursing notes.  Pertinent labs & imaging results that were available during my care of the patient were reviewed by me and considered in my medical decision making (see chart for details).    After the initial evaluation of a respiratory therapist suctioned to the patient, and cleaned the tracheostomy site 7:43 AM Patient smiling, as she has been on several prior evaluations.  Labs reviewed with family, generally reassuring aside from mild leukocytosis. Urinalysis not consistent with UTI obviously, but with fever, there is some suspicion for this contributing to her leukocytosis and fever. No obvious evidence for pneumonia on x-ray, no obvious cutaneous findings. Patient remains smiling, awake and alert, throughout the ED evaluation. Patient will return to her nursing facility, with a course of Bactrim.  Final Clinical Impressions(s) / ED Diagnoses   Final diagnoses:  Fever in adult    ED Discharge Orders        Ordered    sulfamethoxazole-trimethoprim (BACTRIM DS,SEPTRA DS) 800-160 MG tablet  2 times daily     07/10/17 0732       Carmin Muskrat, MD 07/10/17 [phone]

## 2017-07-10 NOTE — ED Notes (Signed)
[facility] called for transport back to [facility]

## 2017-07-10 NOTE — ED Notes (Signed)
Urine culture tube sent to main lab

## 2017-07-12 DIAGNOSIS — Z515 Encounter for palliative care: Secondary | ICD-10-CM | POA: Insufficient documentation

## 2017-07-12 DIAGNOSIS — R531 Weakness: Secondary | ICD-10-CM | POA: Insufficient documentation

## 2017-07-12 NOTE — Progress Notes (Signed)
PALLIATIVE CARE CONSULT VISIT   PATIENT NAME: Katelyn [name] DOB: [birth year] MRN: [id number]  PRIMARY CARE PROVIDER:   Aretta Nip, MD  REFERRING PROVIDER:  Dr. Raenette Rover, NP:  Wendi Snipes, Bill [place]inas, MD [place], [place] [address]  RESPONSIBLE PARTY: [name] Wells(grand daughter) financial POA  EMERGENCY CONTACT   [name] (daughter) [phone] Armandina Stammer (daughter) [phone]   RECOMMENDATIONS and PLAN:  1.  Malnutrition of moderate degree E44.0:  Last measured Albumin 2.7.  Current weight=82 lbs and is normally 120lbs.  Related to cancer of the hypopharynx and dysphasia.  Protein calorie supplements at least 2x/day, appetite stimulant.  Nutritional consult if not already performed. Encouraged comfort foods by family.  Poor outcome expected if no improvement of nutritional status.   2.  Generalized weakness  R53.1:  Multifactorial.  Deconditioned with untreated hypopharyngeal cancer.  Pending surgical treatment of cancer and PEG tube placement for long-term tube feeding.  ROM exercises as tolerated.   3.  Palliative Care encounter  Z51.5:  Lengthy discussion with pt and her daughters.  Pt. States that she has decided to have the cancer surgery in [facility].  "She wants to give it a try".  Advanced care planning discussed and she chooses to have full scope of treatment, CPR, IV, antibiotics and return to the hospital if needed.   Transition to Hospice care if pt. Is no longer a surgical candidate and/or additional decline.  Strongly encouraged completion of HCPOA documentation(grand-daughter is financial POA).  To be completed on 5/16 per daughter.  Palliative care will continue to follow.  I spent 90 minutes providing this consultation at [facility],  from 2:30pm to 4:00pm. More than 50% of the time in this consultation was spent coordinating communication with pt, daughters, SW and nursing staff.   HISTORY OF  PRESENT ILLNESS:  Katelyn [name] is a 67 y.o. year old female with multiple medical problems including pathological R hip fracture, hypopharyngeal cancer with dysphasia, stage 3 CKD and CHF.  She was admitted to the hospital from 4/15-4/18 due to the hip fracture and it was determined that she had an oral mass which was malignant. No plans for chemotherapy or radiation. She had an emergency tracheotomy.  Pt. Now at snf for reconditioning and pending surgical cancer treatment.  Family plans on pt returning home when possible.  Palliative Care was asked to help address goals of care.   CODE STATUS: FULL CODE  PPS: 20% HOSPICE ELIGIBILITY/DIAGNOSIS: YES/ Protein calorie malnutrition with hypopharyngeal cancer-  Pt. Will have resection of cancer  PAST MEDICAL HISTORY:  Past Medical History:  Diagnosis Date  . Acute gallstone pancreatitis 01/08/2014  . AKI (acute kidney injury) ([place]) 01/08/2014  . Anemia   . Blood transfusion    11'15 last admission  . Cholecystitis   . Hypercholesteremia   . Hypertension   . Right leg DVT ([place]) 05/08/2014  . Septic shock - Kelbsiella 01/09/2014  . Shortness of breath   . Stroke [facility])    '13- right side weakness, ambulates with cane    SOCIAL HX:  Social History   Tobacco Use  . Smoking status: Former Smoker    Packs/day: 0.25    Types: Cigarettes    Last attempt to quit: 05/29/2017    Years since quitting: 0.1  . Smokeless tobacco: Never Used  Substance Use Topics  . Alcohol use: No    Alcohol/week: 0.0 oz    ALLERGIES: No  Known Allergies   PERTINENT MEDICATIONS:  Outpatient Encounter Medications as of 07/08/2017  Medication Sig  . acetaminophen (TYLENOL) 325 MG tablet Take 162.5-325 mg by mouth as needed for mild pain or headache.   . citalopram (CELEXA) 10 MG/5ML suspension Take 10 mLs (20 mg total) by mouth daily.  Marland Kitchen oxycodone (OXY-IR) 5 MG capsule Take 5-10 mg by mouth every 4 (four) hours as needed for pain.   . polyethylene glycol  (MIRALAX / GLYCOLAX) packet Take 17 g by mouth daily as needed for mild constipation.  . sennosides-docusate sodium (SENOKOT-S) 8.6-50 MG tablet Take 2 tablets by mouth at bedtime.    No facility-administered encounter medications on file as of 07/08/2017.     PHYSICAL EXAM:   General: Very frail and fragile appearing, thin.  In NAD Cardiovascular: regular rate and rhythm Pulmonary: Tracheostomy in place. Coarse rhonchi of bilat. upper lobes. O2 via trach cuff in use  Abdomen: soft, nontender, + bowel sounds Extremities: no edema, No muscle mass noted Skin:Exposed skin is intact Neurological: Alert and oriented. Weakness but otherwise nonfocal.  Speaks in sentences  Gonzella Lex, NP-C

## 2017-07-17 ENCOUNTER — Non-Acute Institutional Stay: Payer: Self-pay | Admitting: Internal Medicine

## 2017-07-17 DIAGNOSIS — R531 Weakness: Secondary | ICD-10-CM | POA: Diagnosis not present

## 2017-07-17 DIAGNOSIS — E440 Moderate protein-calorie malnutrition: Secondary | ICD-10-CM | POA: Diagnosis not present

## 2017-07-17 DIAGNOSIS — Z515 Encounter for palliative care: Secondary | ICD-10-CM | POA: Diagnosis not present

## 2017-07-22 DIAGNOSIS — R100 Acute abdomen: Secondary | ICD-10-CM | POA: Diagnosis not present

## 2017-07-22 DIAGNOSIS — M2550 Pain in unspecified joint: Secondary | ICD-10-CM | POA: Diagnosis not present

## 2017-07-22 DIAGNOSIS — G464 Cerebellar stroke syndrome: Secondary | ICD-10-CM | POA: Diagnosis not present

## 2017-07-22 DIAGNOSIS — Z7401 Bed confinement status: Secondary | ICD-10-CM | POA: Diagnosis not present

## 2017-07-24 DIAGNOSIS — F418 Other specified anxiety disorders: Secondary | ICD-10-CM | POA: Diagnosis not present

## 2017-07-30 DIAGNOSIS — C321 Malignant neoplasm of supraglottis: Secondary | ICD-10-CM | POA: Diagnosis not present

## 2017-07-30 DIAGNOSIS — R64 Cachexia: Secondary | ICD-10-CM | POA: Diagnosis not present

## 2017-07-30 DIAGNOSIS — I509 Heart failure, unspecified: Secondary | ICD-10-CM | POA: Diagnosis present

## 2017-07-30 DIAGNOSIS — Z9889 Other specified postprocedural states: Secondary | ICD-10-CM | POA: Diagnosis not present

## 2017-07-30 DIAGNOSIS — E43 Unspecified severe protein-calorie malnutrition: Secondary | ICD-10-CM | POA: Diagnosis present

## 2017-07-30 DIAGNOSIS — Z930 Tracheostomy status: Secondary | ICD-10-CM | POA: Diagnosis not present

## 2017-07-30 DIAGNOSIS — Z86718 Personal history of other venous thrombosis and embolism: Secondary | ICD-10-CM | POA: Diagnosis not present

## 2017-07-30 DIAGNOSIS — Z483 Aftercare following surgery for neoplasm: Secondary | ICD-10-CM | POA: Diagnosis not present

## 2017-07-30 DIAGNOSIS — R531 Weakness: Secondary | ICD-10-CM | POA: Diagnosis not present

## 2017-07-30 DIAGNOSIS — Z87891 Personal history of nicotine dependence: Secondary | ICD-10-CM | POA: Diagnosis not present

## 2017-07-30 DIAGNOSIS — Z430 Encounter for attention to tracheostomy: Secondary | ICD-10-CM | POA: Diagnosis not present

## 2017-07-30 DIAGNOSIS — I69341 Monoplegia of lower limb following cerebral infarction affecting right dominant side: Secondary | ICD-10-CM | POA: Diagnosis not present

## 2017-07-30 DIAGNOSIS — C328 Malignant neoplasm of overlapping sites of larynx: Secondary | ICD-10-CM | POA: Diagnosis not present

## 2017-07-30 DIAGNOSIS — F332 Major depressive disorder, recurrent severe without psychotic features: Secondary | ICD-10-CM | POA: Diagnosis not present

## 2017-07-30 DIAGNOSIS — F39 Unspecified mood [affective] disorder: Secondary | ICD-10-CM | POA: Diagnosis not present

## 2017-07-30 DIAGNOSIS — E785 Hyperlipidemia, unspecified: Secondary | ICD-10-CM | POA: Diagnosis not present

## 2017-07-30 DIAGNOSIS — D360 Benign neoplasm of lymph nodes: Secondary | ICD-10-CM | POA: Diagnosis not present

## 2017-07-30 DIAGNOSIS — R591 Generalized enlarged lymph nodes: Secondary | ICD-10-CM | POA: Diagnosis present

## 2017-07-30 DIAGNOSIS — F331 Major depressive disorder, recurrent, moderate: Secondary | ICD-10-CM | POA: Diagnosis present

## 2017-07-30 DIAGNOSIS — D62 Acute posthemorrhagic anemia: Secondary | ICD-10-CM | POA: Diagnosis not present

## 2017-07-30 DIAGNOSIS — M25551 Pain in right hip: Secondary | ICD-10-CM | POA: Diagnosis not present

## 2017-07-30 DIAGNOSIS — Z4659 Encounter for fitting and adjustment of other gastrointestinal appliance and device: Secondary | ICD-10-CM | POA: Diagnosis not present

## 2017-07-30 DIAGNOSIS — D649 Anemia, unspecified: Secondary | ICD-10-CM | POA: Diagnosis not present

## 2017-07-30 DIAGNOSIS — I110 Hypertensive heart disease with heart failure: Secondary | ICD-10-CM | POA: Diagnosis present

## 2017-07-30 DIAGNOSIS — F410 Panic disorder [episodic paroxysmal anxiety] without agoraphobia: Secondary | ICD-10-CM | POA: Diagnosis not present

## 2017-07-30 DIAGNOSIS — R627 Adult failure to thrive: Secondary | ICD-10-CM | POA: Diagnosis present

## 2017-07-30 DIAGNOSIS — E875 Hyperkalemia: Secondary | ICD-10-CM | POA: Diagnosis not present

## 2017-07-30 DIAGNOSIS — Z8673 Personal history of transient ischemic attack (TIA), and cerebral infarction without residual deficits: Secondary | ICD-10-CM | POA: Diagnosis not present

## 2017-07-30 DIAGNOSIS — R630 Anorexia: Secondary | ICD-10-CM | POA: Diagnosis not present

## 2017-07-30 DIAGNOSIS — C329 Malignant neoplasm of larynx, unspecified: Secondary | ICD-10-CM | POA: Diagnosis not present

## 2017-07-31 NOTE — Progress Notes (Signed)
PALLIATIVE CARE CONSULT VISIT   PATIENT NAME: Katelyn [name] DOB: [birth year] MRN: [id number]  PRIMARY CARE PROVIDER:   Aretta Nip, MD  REFERRING PROVIDER:  Dr. Raenette Rover, NP:  Eddie [name]      RESPONSIBLE PARTY: Katelyn [name](grand daughter) POA  EMERGENCY CONTACT   Katelyn [name] (daughter) [phone] Katelyn [name] (daughter) [phone]   RECOMMENDATIONS and PLAN:  1.  Malnutrition of moderate degree E44.0:  Unchanged  Related to cancer of the hypopharynx and dysphasia.  Appetite stimulant.  Continue supplements and protein.  Pending plans for PEG tube insertion with ENT surgery. Poor outcome expected if no improvement of nutritional status.   2.  Generalized weakness  R53.1:  Unchanged.  PT declined and has been terminated.  Appt at [facility] for presurgical evaluation on 5/29. Hopeful for improvement if tube feedings initiated.   3.  Palliative Care encounter  Z51.5:  Full scope of treatment previously chosen. HCPOA completed(grand daughter).  Continuing plans for resection of hypopharynx cancer. Patient desires to return to her home when possible postop. Unknown reality at this time.  She is appropriate for Hospice care at this time but plans on receiving surgery due to cancer.  Palliative care will continue to follow.  I spent 15  minutes providing this consultation at [place],  from 10:00pm to 10:15pm. More than 50% of the time in this consultation was spent coordinating communication with pt and clinical staff  HISTORY OF PRESENT ILLNESS:  Follow-up with  Katelyn [name] finds that she remains in poor functional health.  She typically does not get out of bed and nutritional intake has not improved.  She was treated in the ER for a fever and a UTI since last visit.   She remains total care.   CODE STATUS: FULL CODE  PPS: 20% HOSPICE ELIGIBILITY/DIAGNOSIS: YES/ Protein calorie malnutrition with hypopharyngeal cancer-  Pt. will have  resection of cancer  PAST MEDICAL HISTORY:  Past Medical History:  Diagnosis Date  . Acute gallstone pancreatitis 01/08/2014  . AKI (acute kidney injury) ([place]) 01/08/2014  . Anemia   . Blood transfusion    11'15 last admission  . Cholecystitis   . Hypercholesteremia   . Hypertension   . Right leg DVT ([place]) 05/08/2014  . Septic shock - Kelbsiella 01/09/2014  . Shortness of breath   . Stroke [facility])    '13- right side weakness, ambulates with cane    SOCIAL HX:  Social History   Tobacco Use  . Smoking status: Former Smoker    Packs/day: 0.25    Types: Cigarettes    Last attempt to quit: 05/29/2017    Years since quitting: 0.1  . Smokeless tobacco: Never Used  Substance Use Topics  . Alcohol use: No    Alcohol/week: 0.0 oz    ALLERGIES: No Known Allergies   PERTINENT MEDICATIONS:  Outpatient Encounter Medications as of 07/08/2017  Medication Sig  . acetaminophen (TYLENOL) 325 MG tablet Take 162.5-325 mg by mouth as needed for mild pain or headache.   . citalopram (CELEXA) 10 MG/5ML suspension Take 10 mLs (20 mg total) by mouth daily.  Marland Kitchen oxycodone (OXY-IR) 5 MG capsule Take 5-10 mg by mouth every 4 (four) hours as needed for pain.   . polyethylene glycol (MIRALAX / GLYCOLAX) packet Take 17 g by mouth daily as needed for mild constipation.  . sennosides-docusate sodium (SENOKOT-S) 8.6-50 MG tablet Take 2 tablets by mouth at bedtime.    No facility-administered encounter medications on file as of  07/08/2017.     PHYSICAL EXAM:   General: Very frail and fragile appearing, thin.  In NAD Cardiovascular: regular rate and rhythm Pulmonary: Tracheostomy in place. Coarse rhonchi of bilat. Upper and lower lobes.  Moist cough.. O2 via trach cuff in use  Abdomen: soft, nontender, + bowel sounds Extremities: no edema, No muscle mass noted Skin:Reports of stage 2 sacral wound but not seen.  Exposed skin is intact Neurological: Alert and oriented.x 3. Weakness but otherwise nonfocal.   Speaks in sentences  Katelyn Lex, NP-C

## 2017-08-14 DIAGNOSIS — I509 Heart failure, unspecified: Secondary | ICD-10-CM | POA: Diagnosis not present

## 2017-08-14 DIAGNOSIS — C321 Malignant neoplasm of supraglottis: Secondary | ICD-10-CM | POA: Diagnosis not present

## 2017-08-14 DIAGNOSIS — C329 Malignant neoplasm of larynx, unspecified: Secondary | ICD-10-CM | POA: Diagnosis not present

## 2017-08-14 DIAGNOSIS — Z483 Aftercare following surgery for neoplasm: Secondary | ICD-10-CM | POA: Diagnosis not present

## 2017-08-14 DIAGNOSIS — Z430 Encounter for attention to tracheostomy: Secondary | ICD-10-CM | POA: Diagnosis not present

## 2017-08-14 DIAGNOSIS — I110 Hypertensive heart disease with heart failure: Secondary | ICD-10-CM | POA: Diagnosis not present

## 2017-08-17 DIAGNOSIS — C321 Malignant neoplasm of supraglottis: Secondary | ICD-10-CM | POA: Diagnosis not present

## 2017-08-17 DIAGNOSIS — I509 Heart failure, unspecified: Secondary | ICD-10-CM | POA: Diagnosis not present

## 2017-08-17 DIAGNOSIS — Z483 Aftercare following surgery for neoplasm: Secondary | ICD-10-CM | POA: Diagnosis not present

## 2017-08-17 DIAGNOSIS — I110 Hypertensive heart disease with heart failure: Secondary | ICD-10-CM | POA: Diagnosis not present

## 2017-08-17 DIAGNOSIS — C329 Malignant neoplasm of larynx, unspecified: Secondary | ICD-10-CM | POA: Diagnosis not present

## 2017-08-17 DIAGNOSIS — Z430 Encounter for attention to tracheostomy: Secondary | ICD-10-CM | POA: Diagnosis not present

## 2017-08-18 DIAGNOSIS — C321 Malignant neoplasm of supraglottis: Secondary | ICD-10-CM | POA: Diagnosis not present

## 2017-08-18 DIAGNOSIS — Z483 Aftercare following surgery for neoplasm: Secondary | ICD-10-CM | POA: Diagnosis not present

## 2017-08-18 DIAGNOSIS — I509 Heart failure, unspecified: Secondary | ICD-10-CM | POA: Diagnosis not present

## 2017-08-18 DIAGNOSIS — Z430 Encounter for attention to tracheostomy: Secondary | ICD-10-CM | POA: Diagnosis not present

## 2017-08-18 DIAGNOSIS — I110 Hypertensive heart disease with heart failure: Secondary | ICD-10-CM | POA: Diagnosis not present

## 2017-08-18 DIAGNOSIS — C329 Malignant neoplasm of larynx, unspecified: Secondary | ICD-10-CM | POA: Diagnosis not present

## 2017-08-19 DIAGNOSIS — Z483 Aftercare following surgery for neoplasm: Secondary | ICD-10-CM | POA: Diagnosis not present

## 2017-08-19 DIAGNOSIS — C321 Malignant neoplasm of supraglottis: Secondary | ICD-10-CM | POA: Diagnosis not present

## 2017-08-19 DIAGNOSIS — C329 Malignant neoplasm of larynx, unspecified: Secondary | ICD-10-CM | POA: Diagnosis not present

## 2017-08-19 DIAGNOSIS — I110 Hypertensive heart disease with heart failure: Secondary | ICD-10-CM | POA: Diagnosis not present

## 2017-08-19 DIAGNOSIS — I509 Heart failure, unspecified: Secondary | ICD-10-CM | POA: Diagnosis not present

## 2017-08-19 DIAGNOSIS — Z430 Encounter for attention to tracheostomy: Secondary | ICD-10-CM | POA: Diagnosis not present

## 2017-08-20 DIAGNOSIS — Z483 Aftercare following surgery for neoplasm: Secondary | ICD-10-CM | POA: Diagnosis not present

## 2017-08-20 DIAGNOSIS — Z430 Encounter for attention to tracheostomy: Secondary | ICD-10-CM | POA: Diagnosis not present

## 2017-08-20 DIAGNOSIS — I509 Heart failure, unspecified: Secondary | ICD-10-CM | POA: Diagnosis not present

## 2017-08-20 DIAGNOSIS — C321 Malignant neoplasm of supraglottis: Secondary | ICD-10-CM | POA: Diagnosis not present

## 2017-08-20 DIAGNOSIS — C329 Malignant neoplasm of larynx, unspecified: Secondary | ICD-10-CM | POA: Diagnosis not present

## 2017-08-20 DIAGNOSIS — I110 Hypertensive heart disease with heart failure: Secondary | ICD-10-CM | POA: Diagnosis not present

## 2017-08-24 DIAGNOSIS — C321 Malignant neoplasm of supraglottis: Secondary | ICD-10-CM | POA: Diagnosis not present

## 2017-08-24 DIAGNOSIS — R490 Dysphonia: Secondary | ICD-10-CM | POA: Diagnosis not present

## 2017-08-25 DIAGNOSIS — I110 Hypertensive heart disease with heart failure: Secondary | ICD-10-CM | POA: Diagnosis not present

## 2017-08-25 DIAGNOSIS — C329 Malignant neoplasm of larynx, unspecified: Secondary | ICD-10-CM | POA: Diagnosis not present

## 2017-08-25 DIAGNOSIS — I509 Heart failure, unspecified: Secondary | ICD-10-CM | POA: Diagnosis not present

## 2017-08-25 DIAGNOSIS — C321 Malignant neoplasm of supraglottis: Secondary | ICD-10-CM | POA: Diagnosis not present

## 2017-08-25 DIAGNOSIS — Z430 Encounter for attention to tracheostomy: Secondary | ICD-10-CM | POA: Diagnosis not present

## 2017-08-25 DIAGNOSIS — Z483 Aftercare following surgery for neoplasm: Secondary | ICD-10-CM | POA: Diagnosis not present

## 2017-08-27 DIAGNOSIS — I110 Hypertensive heart disease with heart failure: Secondary | ICD-10-CM | POA: Diagnosis not present

## 2017-08-27 DIAGNOSIS — Z430 Encounter for attention to tracheostomy: Secondary | ICD-10-CM | POA: Diagnosis not present

## 2017-08-27 DIAGNOSIS — Z483 Aftercare following surgery for neoplasm: Secondary | ICD-10-CM | POA: Diagnosis not present

## 2017-08-27 DIAGNOSIS — I509 Heart failure, unspecified: Secondary | ICD-10-CM | POA: Diagnosis not present

## 2017-08-27 DIAGNOSIS — C329 Malignant neoplasm of larynx, unspecified: Secondary | ICD-10-CM | POA: Diagnosis not present

## 2017-08-27 DIAGNOSIS — C321 Malignant neoplasm of supraglottis: Secondary | ICD-10-CM | POA: Diagnosis not present

## 2017-08-28 DIAGNOSIS — S72141D Displaced intertrochanteric fracture of right femur, subsequent encounter for closed fracture with routine healing: Secondary | ICD-10-CM | POA: Diagnosis not present

## 2017-08-31 DIAGNOSIS — F329 Major depressive disorder, single episode, unspecified: Secondary | ICD-10-CM | POA: Diagnosis not present

## 2017-08-31 DIAGNOSIS — C321 Malignant neoplasm of supraglottis: Secondary | ICD-10-CM | POA: Diagnosis not present

## 2017-08-31 DIAGNOSIS — D638 Anemia in other chronic diseases classified elsewhere: Secondary | ICD-10-CM | POA: Diagnosis not present

## 2017-08-31 DIAGNOSIS — Z430 Encounter for attention to tracheostomy: Secondary | ICD-10-CM | POA: Diagnosis not present

## 2017-08-31 DIAGNOSIS — R1319 Other dysphagia: Secondary | ICD-10-CM | POA: Diagnosis not present

## 2017-08-31 DIAGNOSIS — C139 Malignant neoplasm of hypopharynx, unspecified: Secondary | ICD-10-CM | POA: Diagnosis not present

## 2017-08-31 DIAGNOSIS — I10 Essential (primary) hypertension: Secondary | ICD-10-CM | POA: Diagnosis not present

## 2017-08-31 DIAGNOSIS — I110 Hypertensive heart disease with heart failure: Secondary | ICD-10-CM | POA: Diagnosis not present

## 2017-08-31 DIAGNOSIS — Z8673 Personal history of transient ischemic attack (TIA), and cerebral infarction without residual deficits: Secondary | ICD-10-CM | POA: Diagnosis not present

## 2017-08-31 DIAGNOSIS — C329 Malignant neoplasm of larynx, unspecified: Secondary | ICD-10-CM | POA: Diagnosis not present

## 2017-08-31 DIAGNOSIS — Z9002 Acquired absence of larynx: Secondary | ICD-10-CM | POA: Diagnosis not present

## 2017-08-31 DIAGNOSIS — I509 Heart failure, unspecified: Secondary | ICD-10-CM | POA: Diagnosis not present

## 2017-08-31 DIAGNOSIS — Z483 Aftercare following surgery for neoplasm: Secondary | ICD-10-CM | POA: Diagnosis not present

## 2017-09-01 DIAGNOSIS — I110 Hypertensive heart disease with heart failure: Secondary | ICD-10-CM | POA: Diagnosis not present

## 2017-09-01 DIAGNOSIS — C329 Malignant neoplasm of larynx, unspecified: Secondary | ICD-10-CM | POA: Diagnosis not present

## 2017-09-01 DIAGNOSIS — I509 Heart failure, unspecified: Secondary | ICD-10-CM | POA: Diagnosis not present

## 2017-09-01 DIAGNOSIS — Z483 Aftercare following surgery for neoplasm: Secondary | ICD-10-CM | POA: Diagnosis not present

## 2017-09-01 DIAGNOSIS — Z430 Encounter for attention to tracheostomy: Secondary | ICD-10-CM | POA: Diagnosis not present

## 2017-09-01 DIAGNOSIS — C321 Malignant neoplasm of supraglottis: Secondary | ICD-10-CM | POA: Diagnosis not present

## 2017-09-03 DIAGNOSIS — I110 Hypertensive heart disease with heart failure: Secondary | ICD-10-CM | POA: Diagnosis not present

## 2017-09-03 DIAGNOSIS — Z483 Aftercare following surgery for neoplasm: Secondary | ICD-10-CM | POA: Diagnosis not present

## 2017-09-03 DIAGNOSIS — C321 Malignant neoplasm of supraglottis: Secondary | ICD-10-CM | POA: Diagnosis not present

## 2017-09-03 DIAGNOSIS — I509 Heart failure, unspecified: Secondary | ICD-10-CM | POA: Diagnosis not present

## 2017-09-03 DIAGNOSIS — C329 Malignant neoplasm of larynx, unspecified: Secondary | ICD-10-CM | POA: Diagnosis not present

## 2017-09-03 DIAGNOSIS — Z430 Encounter for attention to tracheostomy: Secondary | ICD-10-CM | POA: Diagnosis not present

## 2017-09-05 ENCOUNTER — Other Ambulatory Visit: Payer: Self-pay

## 2017-09-05 ENCOUNTER — Ambulatory Visit (INDEPENDENT_AMBULATORY_CARE_PROVIDER_SITE_OTHER): Payer: Medicare Other

## 2017-09-05 ENCOUNTER — Ambulatory Visit (HOSPITAL_COMMUNITY)
Admission: EM | Admit: 2017-09-05 | Discharge: 2017-09-05 | Disposition: A | Discharge status: Home / Self Care | Payer: Medicare Other | Attending: Internal Medicine | Admitting: Internal Medicine

## 2017-09-05 ENCOUNTER — Encounter (HOSPITAL_COMMUNITY): Payer: Self-pay | Admitting: Emergency Medicine

## 2017-09-05 DIAGNOSIS — S79911A Unspecified injury of right hip, initial encounter: Secondary | ICD-10-CM

## 2017-09-05 DIAGNOSIS — W19XXXA Unspecified fall, initial encounter: Secondary | ICD-10-CM

## 2017-09-05 DIAGNOSIS — S0003XA Contusion of scalp, initial encounter: Secondary | ICD-10-CM

## 2017-09-05 DIAGNOSIS — W050XXA Fall from non-moving wheelchair, initial encounter: Secondary | ICD-10-CM | POA: Diagnosis not present

## 2017-09-05 DIAGNOSIS — Z993 Dependence on wheelchair: Secondary | ICD-10-CM | POA: Diagnosis not present

## 2017-09-05 DIAGNOSIS — S72141A Displaced intertrochanteric fracture of right femur, initial encounter for closed fracture: Secondary | ICD-10-CM | POA: Diagnosis not present

## 2017-09-05 NOTE — ED Provider Notes (Signed)
[place]    CSN: [id number] Arrival date & time: 09/05/17  1113     History   Chief Complaint Chief Complaint  Patient presents with  . Fall    HPI Katelyn [name] is a 67 y.o. female.   Katelyn [name] presents with family s/p fall this morning. She is primarily wheelchair bound, has had multiple hospitalizations over the past few months. Tried to stand from wheelchair which was not locked causing her to lose balance and fall onto buttocks. Struck the back of her head on linoleum flooring. She did not lose conscious's. Witnessed by family members. She is not on a blood thinner. Normal mentation since. Moving all extremities. No nausea or vomiting. No headache. Recent right hip fracture with nail placed, 05/29/17. Also with laryngeal cancer with recent surgery, trach in place. No change in lower extremity strength but patient with limited use at baseline. In seated walker in exam room. On arrival to room patient agitated mouthing (no voice box per daughter) that she did not want to be seen and family forced her. She states she feels fine. She is alert, oriented, moving arms and legs without difficulty.     ROS per HPI.      Past Medical History:  Diagnosis Date  . Acute gallstone pancreatitis 01/08/2014  . AKI (acute kidney injury) ([place]) 01/08/2014  . Anemia   . Blood transfusion    11'15 last admission  . Cholecystitis   . Hypercholesteremia   . Hypertension   . Right leg DVT ([place]) 05/08/2014  . Septic shock - Kelbsiella 01/09/2014  . Shortness of breath   . Stroke [facility])    '13- right side weakness, ambulates with cane    Patient Active Problem List   Diagnosis Date Noted  . Generalized weakness 07/12/2017  . Palliative care encounter 07/12/2017  . 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 Pressure injury of skin 05/30/2017  . Malnutrition of moderate degree 05/30/2017  . Hip fx, right, closed, initial encounter ([place]) 05/29/2017  . Closed displaced  intertrochanteric fracture of right femur ([place]) 05/29/2017  . Dysphagia 05/29/2017  . Weight loss 05/29/2017  . Difficult airway for intubation 05/29/2017  . Lesion of epiglottis 05/26/2014  . Abnormal CT scan, pancreas or bile duct   . Right leg DVT ([place]) 05/08/2014  . Anemia 01/09/2014  . Hip pain, right 05/06/2011  . 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 CKD (chronic kidney disease) stage 3, GFR 30-59 ml/min (HCC) 03/04/2011  . Diastolic dysfunction 78/24/2353  . Tobacco abuse 03/04/2011  . Constipation 03/04/2011  . Malignant hypertensive urgency 03/01/2011  . CVA (cerebral infarction) 03/01/2011  . NEUROPATHY, IDIOPATHIC 10/25/2007  . Hypercholesteremia 12/15/2006  . Essential hypertension 12/15/2006    Past Surgical History:  Procedure Laterality Date  . ABDOMINAL HYSTERECTOMY    . DIRECT LARYNGOSCOPY N/A 06/08/2017   Procedure: DIRECT LARYNGOSCOPY WITH BIOPSY;  Surgeon: Helayne Seminole, MD;  Location[facility] OR;  Service: ENT;  Laterality: N/A;  . ENDOSCOPIC RETROGRADE CHOLANGIOPANCREATOGRAPHY (ERCP) WITH PROPOFOL N/A 01/11/2014   Procedure: ENDOSCOPIC RETROGRADE CHOLANGIOPANCREATOGRAPHY (ERCP) WITH PROPOFOL;  Surgeon: Gatha Mayer, MD;  Location: [place];  Service: Endoscopy;  Laterality: N/A;  . ERCP    . ERCP N/A 05/26/2014   Procedure: ENDOSCOPIC RETROGRADE CHOLANGIOPANCREATOGRAPHY (ERCP);  Surgeon: Gatha Mayer, MD;  Location: [facility];  Service: Endoscopy;  Laterality: N/A;  . ESOPHAGOGASTRODUODENOSCOPY (EGD) WITH PROPOFOL N/A 12/07/2014   Procedure: ESOPHAGOGASTRODUODENOSCOPY (EGD) WITH PROPOFOL;  Surgeon: Milus Banister, MD;  Location: [facility] ENDOSCOPY;  Service: Endoscopy;  Laterality: N/A;  . EUS N/A 12/07/2014   Procedure: UPPER ENDOSCOPIC ULTRASOUND (EUS) LINEAR;  Surgeon: Milus Banister, MD;  Location: [facility] ENDOSCOPY;  Service: Endoscopy;  Laterality: N/A;  . GASTROINTESTINAL STENT REMOVAL N/A 12/07/2014   Procedure: GASTROINTESTINAL STENT  REMOVAL;  Surgeon: Milus Banister, MD;  Location: [facility] ENDOSCOPY;  Service: Endoscopy;  Laterality: N/A;  pancreatic stent removal  . INTRAMEDULLARY (IM) NAIL INTERTROCHANTERIC Right 05/29/2017   Procedure: INTRAMEDULLARY (IM) NAIL INTERTROCHANTRIC FEMUR;  Surgeon: Nicholes Stairs, MD;  Location: [place];  Service: Orthopedics;  Laterality: Right;  . PERCUTANEOUS TRACHEOSTOMY N/A 05/29/2017   Procedure: PERCUTANEOUS TRACHEOSTOMY;  Surgeon: Judeth Horn, MD;  Location: [place];  Service: General;  Laterality: N/A;  . TEE WITHOUT CARDIOVERSION  03/06/2011   Procedure: TRANSESOPHAGEAL ECHOCARDIOGRAM (TEE);  Surgeon: Jolaine Artist, MD;  Location: [facility] ENDOSCOPY;  Service: Cardiovascular;  Laterality: N/A;    OB History   None      Home Medications    Prior to Admission medications   Medication Sig Start Date End Date Taking? Authorizing Provider  citalopram (CELEXA) 10 MG/5ML suspension Take 10 mLs (20 mg total) by mouth daily. 06/23/17  Yes Eppie Gibson, MD  acetaminophen (TYLENOL) 325 MG tablet Take 162.5-325 mg by mouth as needed for mild pain or headache.     [provider]  ALPRAZolam Duanne Moron) 0.25 MG tablet Take 0.25 mg by mouth as needed for anxiety or sleep.    [provider]  oxycodone (OXY-IR) 5 MG capsule Take 5-10 mg by mouth every 4 (four) hours as needed for pain.     [provider]  polyethylene glycol (MIRALAX / GLYCOLAX) packet Take 17 g by mouth daily as needed for mild constipation.    [provider]  sennosides-docusate sodium (SENOKOT-S) 8.6-50 MG tablet Take 2 tablets by mouth at bedtime.     [provider]    Family History Family History  Problem Relation Age of Onset  . Breast cancer Maternal Aunt   . Diabetes Mellitus I Father   . Diabetes Mellitus I Sister   . Anesthesia problems Neg Hx   . Hypotension Neg Hx   . Malignant hyperthermia Neg Hx   . Pseudochol deficiency Neg Hx   . Colon cancer Neg Hx   .  Esophageal cancer Neg Hx   . Pancreatic cancer Neg Hx   . Kidney disease Neg Hx   . Liver disease Neg Hx     Social History Social History   Tobacco Use  . Smoking status: Former Smoker    Packs/day: 0.25    Types: Cigarettes    Last attempt to quit: 05/29/2017    Years since quitting: 0.2  . Smokeless tobacco: Never Used  Substance Use Topics  . Alcohol use: No    Alcohol/week: 0.0 oz  . Drug use: No     Allergies   Patient has no known allergies.   Review of Systems Review of Systems   Physical Exam Triage Vital Signs ED Triage Vitals  Enc Vitals Group     BP 09/05/17 1208 (!) 164/88     Pulse Rate 09/05/17 1208 93     Resp 09/05/17 1208 (!) 22     Temp 09/05/17 1208 98.5 F (36.9 C)     Temp Source 09/05/17 1208 Oral     SpO2 09/05/17 1208 97 %     Weight --      Height --  Head Circumference --      Peak Flow --      Pain Score 09/05/17 1205 0     Pain Loc --      Pain Edu? --      Excl. in [place]? --    No data found.  Updated Vital Signs BP (!) 164/88 (BP Location: Left Arm)   Pulse 93   Temp 98.5 F (36.9 C) (Oral)   Resp (!) 22   SpO2 97%   Visual Acuity Right Eye Distance:   Left Eye Distance:   Bilateral Distance:    Right Eye Near:   Left Eye Near:    Bilateral Near:     Physical Exam  Constitutional: She is oriented to person, place, and time. She appears well-developed and well-nourished. No distress.  HENT:  Head: Head is with contusion.    Right Ear: External ear normal.  Left Ear: External ear normal.  Soft fluctuant hematoma, approximately 1 cm in diameter to posterior scalp   Eyes: Pupils are equal, round, and reactive to light. Conjunctivae and EOM are normal.  Neck: Normal range of motion.  Cardiovascular: Normal rate, regular rhythm and normal heart sounds.  Pulmonary/Chest: Effort normal. She has no decreased breath sounds.  Trach in place, no distress, lungs clear   Musculoskeletal:  Denies change to tenderness  of right hip from baseline for her; peripheral edema to bilateral feet noted, no change from baseline per patient and family; moving lower legs while seated on her walker seat; no back pain on palpation   Neurological: She is alert and oriented to person, place, and time. She has normal strength. She is not disoriented. No cranial nerve deficit or sensory deficit. GCS eye subscore is 4. GCS verbal subscore is 5. GCS motor subscore is 6.  Patient alert, interactive, appropriate yet somewhat agitated with being here for visit; moving arms about without difficulty; moving legs at baseline  Skin: Skin is warm and dry.     UC Treatments / Results  Labs (all labs ordered are listed, but only abnormal results are displayed) Labs Reviewed - No data to display  EKG None  Radiology Dg Hip Unilat With Pelvis 2-3 Views Right  Result Date: 09/05/2017 CLINICAL DATA:  Patient states that she fell today landing on her right side, hip pain. Patient denied hx of hip surgery. Patient has had hip replacement. EXAM: DG HIP (WITH OR WITHOUT PELVIS) 2-3V RIGHT COMPARISON:  05/29/2017 FINDINGS: Patient has had ORIF of RIGHT femoral fracture with lag screw and intramedullary nail. There has been further impaction of the femoral neck and protrusion of the lag screw since postoperative exam. There is resulting foreshortening of the femoral neck. Bones appear radiolucent. IMPRESSION: 1. ORIF of the RIGHT hip. 2. Suspect re-injury of the RIGHT hip with further impaction and shortening of the femoral neck and protruding lag screw. Electronically Signed   By: Nolon Nations M.D.   On: 09/05/2017 13:09    Procedures Procedures (including critical care time)  Medications Ordered in UC Medications - No data to display  Initial Impression / Assessment and Plan / UC Course  I have reviewed the triage vital signs and the nursing notes.  Pertinent labs & imaging results that were available during my care of the patient  were reviewed by me and considered in my medical decision making (see chart for details).     Non toxic in appearance, no acute neurological deficit noted s/p head injury. No LOC, no headache,  no blood thinners. Small hematoma present. Xray of hip and pelvic obtained due to recent surgery and fall today.  Page to On Call Dr. Stann Mainland with [facility] at 1:20 Pm after receiving radiology read of xray.   Return call. Per Dr. Stann Mainland toe touch weight bearing to RLE for transfers only. To follow up with him in the office next week to discuss options for plan of care.   Discussed with patient and family. Tylenol managing pain at this time, to continue as needed. Return precautions provided. Patient overall upset and agitated at being evaluated.   Family verbalized understanding and agreeable to plan.   Final Clinical Impressions(s) / UC Diagnoses   Final diagnoses:  Fall  Injury of right hip, initial encounter  Hematoma of scalp, initial encounter     Discharge Instructions     Ice to scalp hematoma. Ice to hip. Tylenol as needed for pain.  Toe touch weight bearing only to right leg for transfers, otherwise non-weight bearing.  Please call Monday to make appointment with Dr. Stann Mainland for repeat evaluation and discussion about re-injured right hip.     ED Prescriptions    None     Controlled Substance Prescriptions [place] Controlled Substance Registry consulted? Not Applicable   Zigmund Gottron, NP 09/05/17 1339

## 2017-09-05 NOTE — ED Triage Notes (Signed)
Attempted to stand from unlocked wheelchair and landed in floor.  Landed on buttocks.  Patient hit head on floor.  No loc.  Not on any blood thinners

## 2017-09-05 NOTE — Discharge Instructions (Signed)
Ice to scalp hematoma. Ice to hip. Tylenol as needed for pain.  Toe touch weight bearing only to right leg for transfers, otherwise non-weight bearing.  Please call Monday to make appointment with Dr. Stann Mainland for repeat evaluation and discussion about re-injured right hip.

## 2017-09-07 DIAGNOSIS — I110 Hypertensive heart disease with heart failure: Secondary | ICD-10-CM | POA: Diagnosis not present

## 2017-09-07 DIAGNOSIS — C329 Malignant neoplasm of larynx, unspecified: Secondary | ICD-10-CM | POA: Diagnosis not present

## 2017-09-07 DIAGNOSIS — C321 Malignant neoplasm of supraglottis: Secondary | ICD-10-CM | POA: Diagnosis not present

## 2017-09-07 DIAGNOSIS — Z483 Aftercare following surgery for neoplasm: Secondary | ICD-10-CM | POA: Diagnosis not present

## 2017-09-07 DIAGNOSIS — Z430 Encounter for attention to tracheostomy: Secondary | ICD-10-CM | POA: Diagnosis not present

## 2017-09-07 DIAGNOSIS — I509 Heart failure, unspecified: Secondary | ICD-10-CM | POA: Diagnosis not present

## 2017-09-08 DIAGNOSIS — Z483 Aftercare following surgery for neoplasm: Secondary | ICD-10-CM | POA: Diagnosis not present

## 2017-09-08 DIAGNOSIS — I509 Heart failure, unspecified: Secondary | ICD-10-CM | POA: Diagnosis not present

## 2017-09-08 DIAGNOSIS — C329 Malignant neoplasm of larynx, unspecified: Secondary | ICD-10-CM | POA: Diagnosis not present

## 2017-09-08 DIAGNOSIS — Z430 Encounter for attention to tracheostomy: Secondary | ICD-10-CM | POA: Diagnosis not present

## 2017-09-08 DIAGNOSIS — C321 Malignant neoplasm of supraglottis: Secondary | ICD-10-CM | POA: Diagnosis not present

## 2017-09-08 DIAGNOSIS — I110 Hypertensive heart disease with heart failure: Secondary | ICD-10-CM | POA: Diagnosis not present

## 2017-09-09 DIAGNOSIS — R490 Dysphonia: Secondary | ICD-10-CM | POA: Diagnosis not present

## 2017-09-10 DIAGNOSIS — Z430 Encounter for attention to tracheostomy: Secondary | ICD-10-CM | POA: Diagnosis not present

## 2017-09-10 DIAGNOSIS — C321 Malignant neoplasm of supraglottis: Secondary | ICD-10-CM | POA: Diagnosis not present

## 2017-09-10 DIAGNOSIS — Z483 Aftercare following surgery for neoplasm: Secondary | ICD-10-CM | POA: Diagnosis not present

## 2017-09-10 DIAGNOSIS — C329 Malignant neoplasm of larynx, unspecified: Secondary | ICD-10-CM | POA: Diagnosis not present

## 2017-09-10 DIAGNOSIS — I110 Hypertensive heart disease with heart failure: Secondary | ICD-10-CM | POA: Diagnosis not present

## 2017-09-10 DIAGNOSIS — S72141D Displaced intertrochanteric fracture of right femur, subsequent encounter for closed fracture with routine healing: Secondary | ICD-10-CM | POA: Diagnosis not present

## 2017-09-10 DIAGNOSIS — I509 Heart failure, unspecified: Secondary | ICD-10-CM | POA: Diagnosis not present

## 2017-09-11 DIAGNOSIS — C329 Malignant neoplasm of larynx, unspecified: Secondary | ICD-10-CM | POA: Diagnosis not present

## 2017-09-11 DIAGNOSIS — Z483 Aftercare following surgery for neoplasm: Secondary | ICD-10-CM | POA: Diagnosis not present

## 2017-09-11 DIAGNOSIS — C321 Malignant neoplasm of supraglottis: Secondary | ICD-10-CM | POA: Diagnosis not present

## 2017-09-11 DIAGNOSIS — Z430 Encounter for attention to tracheostomy: Secondary | ICD-10-CM | POA: Diagnosis not present

## 2017-09-11 DIAGNOSIS — I509 Heart failure, unspecified: Secondary | ICD-10-CM | POA: Diagnosis not present

## 2017-09-11 DIAGNOSIS — I110 Hypertensive heart disease with heart failure: Secondary | ICD-10-CM | POA: Diagnosis not present

## 2017-09-14 DIAGNOSIS — C329 Malignant neoplasm of larynx, unspecified: Secondary | ICD-10-CM | POA: Diagnosis not present

## 2017-09-14 DIAGNOSIS — I509 Heart failure, unspecified: Secondary | ICD-10-CM | POA: Diagnosis not present

## 2017-09-14 DIAGNOSIS — Z430 Encounter for attention to tracheostomy: Secondary | ICD-10-CM | POA: Diagnosis not present

## 2017-09-14 DIAGNOSIS — I110 Hypertensive heart disease with heart failure: Secondary | ICD-10-CM | POA: Diagnosis not present

## 2017-09-14 DIAGNOSIS — Z483 Aftercare following surgery for neoplasm: Secondary | ICD-10-CM | POA: Diagnosis not present

## 2017-09-14 DIAGNOSIS — C321 Malignant neoplasm of supraglottis: Secondary | ICD-10-CM | POA: Diagnosis not present

## 2017-09-16 DIAGNOSIS — C321 Malignant neoplasm of supraglottis: Secondary | ICD-10-CM | POA: Diagnosis not present

## 2017-09-16 DIAGNOSIS — I509 Heart failure, unspecified: Secondary | ICD-10-CM | POA: Diagnosis not present

## 2017-09-16 DIAGNOSIS — Z430 Encounter for attention to tracheostomy: Secondary | ICD-10-CM | POA: Diagnosis not present

## 2017-09-16 DIAGNOSIS — C329 Malignant neoplasm of larynx, unspecified: Secondary | ICD-10-CM | POA: Diagnosis not present

## 2017-09-16 DIAGNOSIS — I110 Hypertensive heart disease with heart failure: Secondary | ICD-10-CM | POA: Diagnosis not present

## 2017-09-16 DIAGNOSIS — Z483 Aftercare following surgery for neoplasm: Secondary | ICD-10-CM | POA: Diagnosis not present

## 2017-09-17 DIAGNOSIS — Z483 Aftercare following surgery for neoplasm: Secondary | ICD-10-CM | POA: Diagnosis not present

## 2017-09-17 DIAGNOSIS — I110 Hypertensive heart disease with heart failure: Secondary | ICD-10-CM | POA: Diagnosis not present

## 2017-09-17 DIAGNOSIS — Z430 Encounter for attention to tracheostomy: Secondary | ICD-10-CM | POA: Diagnosis not present

## 2017-09-17 DIAGNOSIS — I509 Heart failure, unspecified: Secondary | ICD-10-CM | POA: Diagnosis not present

## 2017-09-17 DIAGNOSIS — C329 Malignant neoplasm of larynx, unspecified: Secondary | ICD-10-CM | POA: Diagnosis not present

## 2017-09-17 DIAGNOSIS — C321 Malignant neoplasm of supraglottis: Secondary | ICD-10-CM | POA: Diagnosis not present

## 2017-09-18 DIAGNOSIS — I110 Hypertensive heart disease with heart failure: Secondary | ICD-10-CM | POA: Diagnosis not present

## 2017-09-18 DIAGNOSIS — Z483 Aftercare following surgery for neoplasm: Secondary | ICD-10-CM | POA: Diagnosis not present

## 2017-09-18 DIAGNOSIS — C321 Malignant neoplasm of supraglottis: Secondary | ICD-10-CM | POA: Diagnosis not present

## 2017-09-18 DIAGNOSIS — Z430 Encounter for attention to tracheostomy: Secondary | ICD-10-CM | POA: Diagnosis not present

## 2017-09-18 DIAGNOSIS — I509 Heart failure, unspecified: Secondary | ICD-10-CM | POA: Diagnosis not present

## 2017-09-18 DIAGNOSIS — C329 Malignant neoplasm of larynx, unspecified: Secondary | ICD-10-CM | POA: Diagnosis not present

## 2017-09-21 DIAGNOSIS — Z9002 Acquired absence of larynx: Secondary | ICD-10-CM | POA: Diagnosis not present

## 2017-09-21 DIAGNOSIS — E46 Unspecified protein-calorie malnutrition: Secondary | ICD-10-CM | POA: Diagnosis not present

## 2017-09-21 DIAGNOSIS — F324 Major depressive disorder, single episode, in partial remission: Secondary | ICD-10-CM | POA: Diagnosis not present

## 2017-09-21 DIAGNOSIS — C139 Malignant neoplasm of hypopharynx, unspecified: Secondary | ICD-10-CM | POA: Diagnosis not present

## 2017-09-21 DIAGNOSIS — I110 Hypertensive heart disease with heart failure: Secondary | ICD-10-CM | POA: Diagnosis not present

## 2017-09-21 DIAGNOSIS — Z430 Encounter for attention to tracheostomy: Secondary | ICD-10-CM | POA: Diagnosis not present

## 2017-09-21 DIAGNOSIS — Z483 Aftercare following surgery for neoplasm: Secondary | ICD-10-CM | POA: Diagnosis not present

## 2017-09-21 DIAGNOSIS — I10 Essential (primary) hypertension: Secondary | ICD-10-CM | POA: Diagnosis not present

## 2017-09-21 DIAGNOSIS — C329 Malignant neoplasm of larynx, unspecified: Secondary | ICD-10-CM | POA: Diagnosis not present

## 2017-09-21 DIAGNOSIS — C321 Malignant neoplasm of supraglottis: Secondary | ICD-10-CM | POA: Diagnosis not present

## 2017-09-21 DIAGNOSIS — I6930 Unspecified sequelae of cerebral infarction: Secondary | ICD-10-CM | POA: Diagnosis not present

## 2017-09-21 DIAGNOSIS — I509 Heart failure, unspecified: Secondary | ICD-10-CM | POA: Diagnosis not present

## 2017-09-22 DIAGNOSIS — I110 Hypertensive heart disease with heart failure: Secondary | ICD-10-CM | POA: Diagnosis not present

## 2017-09-22 DIAGNOSIS — C321 Malignant neoplasm of supraglottis: Secondary | ICD-10-CM | POA: Diagnosis not present

## 2017-09-22 DIAGNOSIS — Z483 Aftercare following surgery for neoplasm: Secondary | ICD-10-CM | POA: Diagnosis not present

## 2017-09-22 DIAGNOSIS — I509 Heart failure, unspecified: Secondary | ICD-10-CM | POA: Diagnosis not present

## 2017-09-22 DIAGNOSIS — Z430 Encounter for attention to tracheostomy: Secondary | ICD-10-CM | POA: Diagnosis not present

## 2017-09-22 DIAGNOSIS — C329 Malignant neoplasm of larynx, unspecified: Secondary | ICD-10-CM | POA: Diagnosis not present

## 2017-09-24 DIAGNOSIS — C321 Malignant neoplasm of supraglottis: Secondary | ICD-10-CM | POA: Diagnosis not present

## 2017-09-24 DIAGNOSIS — Z430 Encounter for attention to tracheostomy: Secondary | ICD-10-CM | POA: Diagnosis not present

## 2017-09-24 DIAGNOSIS — I110 Hypertensive heart disease with heart failure: Secondary | ICD-10-CM | POA: Diagnosis not present

## 2017-09-24 DIAGNOSIS — C329 Malignant neoplasm of larynx, unspecified: Secondary | ICD-10-CM | POA: Diagnosis not present

## 2017-09-24 DIAGNOSIS — Z483 Aftercare following surgery for neoplasm: Secondary | ICD-10-CM | POA: Diagnosis not present

## 2017-09-24 DIAGNOSIS — I509 Heart failure, unspecified: Secondary | ICD-10-CM | POA: Diagnosis not present

## 2017-09-25 DIAGNOSIS — Z483 Aftercare following surgery for neoplasm: Secondary | ICD-10-CM | POA: Diagnosis not present

## 2017-09-25 DIAGNOSIS — I110 Hypertensive heart disease with heart failure: Secondary | ICD-10-CM | POA: Diagnosis not present

## 2017-09-25 DIAGNOSIS — Z430 Encounter for attention to tracheostomy: Secondary | ICD-10-CM | POA: Diagnosis not present

## 2017-09-25 DIAGNOSIS — C329 Malignant neoplasm of larynx, unspecified: Secondary | ICD-10-CM | POA: Diagnosis not present

## 2017-09-25 DIAGNOSIS — I509 Heart failure, unspecified: Secondary | ICD-10-CM | POA: Diagnosis not present

## 2017-09-25 DIAGNOSIS — C321 Malignant neoplasm of supraglottis: Secondary | ICD-10-CM | POA: Diagnosis not present

## 2017-09-28 DIAGNOSIS — Z430 Encounter for attention to tracheostomy: Secondary | ICD-10-CM | POA: Diagnosis not present

## 2017-09-28 DIAGNOSIS — I509 Heart failure, unspecified: Secondary | ICD-10-CM | POA: Diagnosis not present

## 2017-09-28 DIAGNOSIS — C321 Malignant neoplasm of supraglottis: Secondary | ICD-10-CM | POA: Diagnosis not present

## 2017-09-28 DIAGNOSIS — I110 Hypertensive heart disease with heart failure: Secondary | ICD-10-CM | POA: Diagnosis not present

## 2017-09-28 DIAGNOSIS — Z483 Aftercare following surgery for neoplasm: Secondary | ICD-10-CM | POA: Diagnosis not present

## 2017-09-28 DIAGNOSIS — C329 Malignant neoplasm of larynx, unspecified: Secondary | ICD-10-CM | POA: Diagnosis not present

## 2017-09-29 DIAGNOSIS — C321 Malignant neoplasm of supraglottis: Secondary | ICD-10-CM | POA: Diagnosis not present

## 2017-09-29 DIAGNOSIS — I110 Hypertensive heart disease with heart failure: Secondary | ICD-10-CM | POA: Diagnosis not present

## 2017-09-29 DIAGNOSIS — Z430 Encounter for attention to tracheostomy: Secondary | ICD-10-CM | POA: Diagnosis not present

## 2017-09-29 DIAGNOSIS — C329 Malignant neoplasm of larynx, unspecified: Secondary | ICD-10-CM | POA: Diagnosis not present

## 2017-09-29 DIAGNOSIS — Z483 Aftercare following surgery for neoplasm: Secondary | ICD-10-CM | POA: Diagnosis not present

## 2017-09-29 DIAGNOSIS — I509 Heart failure, unspecified: Secondary | ICD-10-CM | POA: Diagnosis not present

## 2017-09-30 DIAGNOSIS — I110 Hypertensive heart disease with heart failure: Secondary | ICD-10-CM | POA: Diagnosis not present

## 2017-09-30 DIAGNOSIS — Z483 Aftercare following surgery for neoplasm: Secondary | ICD-10-CM | POA: Diagnosis not present

## 2017-09-30 DIAGNOSIS — C329 Malignant neoplasm of larynx, unspecified: Secondary | ICD-10-CM | POA: Diagnosis not present

## 2017-09-30 DIAGNOSIS — Z430 Encounter for attention to tracheostomy: Secondary | ICD-10-CM | POA: Diagnosis not present

## 2017-09-30 DIAGNOSIS — C321 Malignant neoplasm of supraglottis: Secondary | ICD-10-CM | POA: Diagnosis not present

## 2017-09-30 DIAGNOSIS — I509 Heart failure, unspecified: Secondary | ICD-10-CM | POA: Diagnosis not present

## 2017-10-02 DIAGNOSIS — I509 Heart failure, unspecified: Secondary | ICD-10-CM | POA: Diagnosis not present

## 2017-10-02 DIAGNOSIS — C321 Malignant neoplasm of supraglottis: Secondary | ICD-10-CM | POA: Diagnosis not present

## 2017-10-02 DIAGNOSIS — Z430 Encounter for attention to tracheostomy: Secondary | ICD-10-CM | POA: Diagnosis not present

## 2017-10-02 DIAGNOSIS — C329 Malignant neoplasm of larynx, unspecified: Secondary | ICD-10-CM | POA: Diagnosis not present

## 2017-10-02 DIAGNOSIS — I110 Hypertensive heart disease with heart failure: Secondary | ICD-10-CM | POA: Diagnosis not present

## 2017-10-02 DIAGNOSIS — Z483 Aftercare following surgery for neoplasm: Secondary | ICD-10-CM | POA: Diagnosis not present

## 2017-10-05 DIAGNOSIS — I509 Heart failure, unspecified: Secondary | ICD-10-CM | POA: Diagnosis not present

## 2017-10-05 DIAGNOSIS — I110 Hypertensive heart disease with heart failure: Secondary | ICD-10-CM | POA: Diagnosis not present

## 2017-10-05 DIAGNOSIS — Z483 Aftercare following surgery for neoplasm: Secondary | ICD-10-CM | POA: Diagnosis not present

## 2017-10-05 DIAGNOSIS — C329 Malignant neoplasm of larynx, unspecified: Secondary | ICD-10-CM | POA: Diagnosis not present

## 2017-10-05 DIAGNOSIS — Z430 Encounter for attention to tracheostomy: Secondary | ICD-10-CM | POA: Diagnosis not present

## 2017-10-05 DIAGNOSIS — C321 Malignant neoplasm of supraglottis: Secondary | ICD-10-CM | POA: Diagnosis not present

## 2017-10-06 DIAGNOSIS — I509 Heart failure, unspecified: Secondary | ICD-10-CM | POA: Diagnosis not present

## 2017-10-06 DIAGNOSIS — I110 Hypertensive heart disease with heart failure: Secondary | ICD-10-CM | POA: Diagnosis not present

## 2017-10-06 DIAGNOSIS — Z483 Aftercare following surgery for neoplasm: Secondary | ICD-10-CM | POA: Diagnosis not present

## 2017-10-06 DIAGNOSIS — C329 Malignant neoplasm of larynx, unspecified: Secondary | ICD-10-CM | POA: Diagnosis not present

## 2017-10-06 DIAGNOSIS — C321 Malignant neoplasm of supraglottis: Secondary | ICD-10-CM | POA: Diagnosis not present

## 2017-10-06 DIAGNOSIS — Z430 Encounter for attention to tracheostomy: Secondary | ICD-10-CM | POA: Diagnosis not present

## 2017-10-07 DIAGNOSIS — C329 Malignant neoplasm of larynx, unspecified: Secondary | ICD-10-CM | POA: Diagnosis not present

## 2017-10-07 DIAGNOSIS — C321 Malignant neoplasm of supraglottis: Secondary | ICD-10-CM | POA: Diagnosis not present

## 2017-10-07 DIAGNOSIS — I509 Heart failure, unspecified: Secondary | ICD-10-CM | POA: Diagnosis not present

## 2017-10-07 DIAGNOSIS — Z483 Aftercare following surgery for neoplasm: Secondary | ICD-10-CM | POA: Diagnosis not present

## 2017-10-07 DIAGNOSIS — I110 Hypertensive heart disease with heart failure: Secondary | ICD-10-CM | POA: Diagnosis not present

## 2017-10-07 DIAGNOSIS — Z430 Encounter for attention to tracheostomy: Secondary | ICD-10-CM | POA: Diagnosis not present

## 2017-10-08 DIAGNOSIS — I509 Heart failure, unspecified: Secondary | ICD-10-CM | POA: Diagnosis not present

## 2017-10-08 DIAGNOSIS — I110 Hypertensive heart disease with heart failure: Secondary | ICD-10-CM | POA: Diagnosis not present

## 2017-10-08 DIAGNOSIS — Z430 Encounter for attention to tracheostomy: Secondary | ICD-10-CM | POA: Diagnosis not present

## 2017-10-08 DIAGNOSIS — C321 Malignant neoplasm of supraglottis: Secondary | ICD-10-CM | POA: Diagnosis not present

## 2017-10-08 DIAGNOSIS — C329 Malignant neoplasm of larynx, unspecified: Secondary | ICD-10-CM | POA: Diagnosis not present

## 2017-10-08 DIAGNOSIS — Z483 Aftercare following surgery for neoplasm: Secondary | ICD-10-CM | POA: Diagnosis not present

## 2017-10-12 DIAGNOSIS — Z483 Aftercare following surgery for neoplasm: Secondary | ICD-10-CM | POA: Diagnosis not present

## 2017-10-12 DIAGNOSIS — I509 Heart failure, unspecified: Secondary | ICD-10-CM | POA: Diagnosis not present

## 2017-10-12 DIAGNOSIS — Z430 Encounter for attention to tracheostomy: Secondary | ICD-10-CM | POA: Diagnosis not present

## 2017-10-12 DIAGNOSIS — C329 Malignant neoplasm of larynx, unspecified: Secondary | ICD-10-CM | POA: Diagnosis not present

## 2017-10-12 DIAGNOSIS — I110 Hypertensive heart disease with heart failure: Secondary | ICD-10-CM | POA: Diagnosis not present

## 2017-10-12 DIAGNOSIS — C321 Malignant neoplasm of supraglottis: Secondary | ICD-10-CM | POA: Diagnosis not present

## 2017-10-15 DIAGNOSIS — M25551 Pain in right hip: Secondary | ICD-10-CM | POA: Diagnosis not present

## 2017-10-15 DIAGNOSIS — F410 Panic disorder [episodic paroxysmal anxiety] without agoraphobia: Secondary | ICD-10-CM | POA: Diagnosis not present

## 2017-10-15 DIAGNOSIS — I509 Heart failure, unspecified: Secondary | ICD-10-CM | POA: Diagnosis not present

## 2017-10-15 DIAGNOSIS — C321 Malignant neoplasm of supraglottis: Secondary | ICD-10-CM | POA: Diagnosis not present

## 2017-10-15 DIAGNOSIS — C329 Malignant neoplasm of larynx, unspecified: Secondary | ICD-10-CM | POA: Diagnosis not present

## 2017-10-15 DIAGNOSIS — R0902 Hypoxemia: Secondary | ICD-10-CM | POA: Diagnosis not present

## 2017-10-15 DIAGNOSIS — F332 Major depressive disorder, recurrent severe without psychotic features: Secondary | ICD-10-CM | POA: Diagnosis not present

## 2017-10-19 DIAGNOSIS — C329 Malignant neoplasm of larynx, unspecified: Secondary | ICD-10-CM | POA: Diagnosis not present

## 2017-10-19 DIAGNOSIS — I509 Heart failure, unspecified: Secondary | ICD-10-CM | POA: Diagnosis not present

## 2017-10-19 DIAGNOSIS — F332 Major depressive disorder, recurrent severe without psychotic features: Secondary | ICD-10-CM | POA: Diagnosis not present

## 2017-10-19 DIAGNOSIS — C321 Malignant neoplasm of supraglottis: Secondary | ICD-10-CM | POA: Diagnosis not present

## 2017-10-19 DIAGNOSIS — F410 Panic disorder [episodic paroxysmal anxiety] without agoraphobia: Secondary | ICD-10-CM | POA: Diagnosis not present

## 2017-10-19 DIAGNOSIS — M25551 Pain in right hip: Secondary | ICD-10-CM | POA: Diagnosis not present

## 2017-10-22 DIAGNOSIS — I509 Heart failure, unspecified: Secondary | ICD-10-CM | POA: Diagnosis not present

## 2017-10-22 DIAGNOSIS — M25551 Pain in right hip: Secondary | ICD-10-CM | POA: Diagnosis not present

## 2017-10-22 DIAGNOSIS — C329 Malignant neoplasm of larynx, unspecified: Secondary | ICD-10-CM | POA: Diagnosis not present

## 2017-10-22 DIAGNOSIS — C321 Malignant neoplasm of supraglottis: Secondary | ICD-10-CM | POA: Diagnosis not present

## 2017-10-22 DIAGNOSIS — F410 Panic disorder [episodic paroxysmal anxiety] without agoraphobia: Secondary | ICD-10-CM | POA: Diagnosis not present

## 2017-10-22 DIAGNOSIS — F332 Major depressive disorder, recurrent severe without psychotic features: Secondary | ICD-10-CM | POA: Diagnosis not present

## 2017-10-27 DIAGNOSIS — C329 Malignant neoplasm of larynx, unspecified: Secondary | ICD-10-CM | POA: Diagnosis not present

## 2017-10-27 DIAGNOSIS — C321 Malignant neoplasm of supraglottis: Secondary | ICD-10-CM | POA: Diagnosis not present

## 2017-10-27 DIAGNOSIS — I509 Heart failure, unspecified: Secondary | ICD-10-CM | POA: Diagnosis not present

## 2017-10-27 DIAGNOSIS — F410 Panic disorder [episodic paroxysmal anxiety] without agoraphobia: Secondary | ICD-10-CM | POA: Diagnosis not present

## 2017-10-27 DIAGNOSIS — M25551 Pain in right hip: Secondary | ICD-10-CM | POA: Diagnosis not present

## 2017-10-27 DIAGNOSIS — F332 Major depressive disorder, recurrent severe without psychotic features: Secondary | ICD-10-CM | POA: Diagnosis not present

## 2017-10-29 DIAGNOSIS — C329 Malignant neoplasm of larynx, unspecified: Secondary | ICD-10-CM | POA: Diagnosis not present

## 2017-10-29 DIAGNOSIS — C321 Malignant neoplasm of supraglottis: Secondary | ICD-10-CM | POA: Diagnosis not present

## 2017-10-29 DIAGNOSIS — I509 Heart failure, unspecified: Secondary | ICD-10-CM | POA: Diagnosis not present

## 2017-10-29 DIAGNOSIS — F410 Panic disorder [episodic paroxysmal anxiety] without agoraphobia: Secondary | ICD-10-CM | POA: Diagnosis not present

## 2017-10-29 DIAGNOSIS — F332 Major depressive disorder, recurrent severe without psychotic features: Secondary | ICD-10-CM | POA: Diagnosis not present

## 2017-10-29 DIAGNOSIS — M25551 Pain in right hip: Secondary | ICD-10-CM | POA: Diagnosis not present

## 2017-11-03 DIAGNOSIS — M25551 Pain in right hip: Secondary | ICD-10-CM | POA: Diagnosis not present

## 2017-11-03 DIAGNOSIS — C321 Malignant neoplasm of supraglottis: Secondary | ICD-10-CM | POA: Diagnosis not present

## 2017-11-03 DIAGNOSIS — F410 Panic disorder [episodic paroxysmal anxiety] without agoraphobia: Secondary | ICD-10-CM | POA: Diagnosis not present

## 2017-11-03 DIAGNOSIS — F332 Major depressive disorder, recurrent severe without psychotic features: Secondary | ICD-10-CM | POA: Diagnosis not present

## 2017-11-03 DIAGNOSIS — I509 Heart failure, unspecified: Secondary | ICD-10-CM | POA: Diagnosis not present

## 2017-11-03 DIAGNOSIS — C329 Malignant neoplasm of larynx, unspecified: Secondary | ICD-10-CM | POA: Diagnosis not present

## 2017-11-04 DIAGNOSIS — F410 Panic disorder [episodic paroxysmal anxiety] without agoraphobia: Secondary | ICD-10-CM | POA: Diagnosis not present

## 2017-11-04 DIAGNOSIS — F332 Major depressive disorder, recurrent severe without psychotic features: Secondary | ICD-10-CM | POA: Diagnosis not present

## 2017-11-04 DIAGNOSIS — C329 Malignant neoplasm of larynx, unspecified: Secondary | ICD-10-CM | POA: Diagnosis not present

## 2017-11-04 DIAGNOSIS — I509 Heart failure, unspecified: Secondary | ICD-10-CM | POA: Diagnosis not present

## 2017-11-04 DIAGNOSIS — M25551 Pain in right hip: Secondary | ICD-10-CM | POA: Diagnosis not present

## 2017-11-04 DIAGNOSIS — C321 Malignant neoplasm of supraglottis: Secondary | ICD-10-CM | POA: Diagnosis not present

## 2017-11-05 DIAGNOSIS — I509 Heart failure, unspecified: Secondary | ICD-10-CM | POA: Diagnosis not present

## 2017-11-05 DIAGNOSIS — C321 Malignant neoplasm of supraglottis: Secondary | ICD-10-CM | POA: Diagnosis not present

## 2017-11-05 DIAGNOSIS — C329 Malignant neoplasm of larynx, unspecified: Secondary | ICD-10-CM | POA: Diagnosis not present

## 2017-11-05 DIAGNOSIS — F332 Major depressive disorder, recurrent severe without psychotic features: Secondary | ICD-10-CM | POA: Diagnosis not present

## 2017-11-05 DIAGNOSIS — M25551 Pain in right hip: Secondary | ICD-10-CM | POA: Diagnosis not present

## 2017-11-05 DIAGNOSIS — F410 Panic disorder [episodic paroxysmal anxiety] without agoraphobia: Secondary | ICD-10-CM | POA: Diagnosis not present

## 2017-11-10 DIAGNOSIS — F332 Major depressive disorder, recurrent severe without psychotic features: Secondary | ICD-10-CM | POA: Diagnosis not present

## 2017-11-10 DIAGNOSIS — C329 Malignant neoplasm of larynx, unspecified: Secondary | ICD-10-CM | POA: Diagnosis not present

## 2017-11-10 DIAGNOSIS — F410 Panic disorder [episodic paroxysmal anxiety] without agoraphobia: Secondary | ICD-10-CM | POA: Diagnosis not present

## 2017-11-10 DIAGNOSIS — M25551 Pain in right hip: Secondary | ICD-10-CM | POA: Diagnosis not present

## 2017-11-10 DIAGNOSIS — C321 Malignant neoplasm of supraglottis: Secondary | ICD-10-CM | POA: Diagnosis not present

## 2017-11-10 DIAGNOSIS — I509 Heart failure, unspecified: Secondary | ICD-10-CM | POA: Diagnosis not present

## 2017-11-11 DIAGNOSIS — Z9002 Acquired absence of larynx: Secondary | ICD-10-CM | POA: Diagnosis not present

## 2017-11-11 DIAGNOSIS — R490 Dysphonia: Secondary | ICD-10-CM | POA: Diagnosis not present

## 2017-11-11 DIAGNOSIS — C329 Malignant neoplasm of larynx, unspecified: Secondary | ICD-10-CM | POA: Diagnosis not present

## 2017-11-12 DIAGNOSIS — M25551 Pain in right hip: Secondary | ICD-10-CM | POA: Diagnosis not present

## 2017-11-12 DIAGNOSIS — C329 Malignant neoplasm of larynx, unspecified: Secondary | ICD-10-CM | POA: Diagnosis not present

## 2017-11-12 DIAGNOSIS — F410 Panic disorder [episodic paroxysmal anxiety] without agoraphobia: Secondary | ICD-10-CM | POA: Diagnosis not present

## 2017-11-12 DIAGNOSIS — C321 Malignant neoplasm of supraglottis: Secondary | ICD-10-CM | POA: Diagnosis not present

## 2017-11-12 DIAGNOSIS — I509 Heart failure, unspecified: Secondary | ICD-10-CM | POA: Diagnosis not present

## 2017-11-12 DIAGNOSIS — F332 Major depressive disorder, recurrent severe without psychotic features: Secondary | ICD-10-CM | POA: Diagnosis not present

## 2017-11-17 DIAGNOSIS — C329 Malignant neoplasm of larynx, unspecified: Secondary | ICD-10-CM | POA: Diagnosis not present

## 2017-11-17 DIAGNOSIS — F332 Major depressive disorder, recurrent severe without psychotic features: Secondary | ICD-10-CM | POA: Diagnosis not present

## 2017-11-17 DIAGNOSIS — C321 Malignant neoplasm of supraglottis: Secondary | ICD-10-CM | POA: Diagnosis not present

## 2017-11-17 DIAGNOSIS — F410 Panic disorder [episodic paroxysmal anxiety] without agoraphobia: Secondary | ICD-10-CM | POA: Diagnosis not present

## 2017-11-17 DIAGNOSIS — M25551 Pain in right hip: Secondary | ICD-10-CM | POA: Diagnosis not present

## 2017-11-17 DIAGNOSIS — I509 Heart failure, unspecified: Secondary | ICD-10-CM | POA: Diagnosis not present

## 2017-11-18 DIAGNOSIS — F410 Panic disorder [episodic paroxysmal anxiety] without agoraphobia: Secondary | ICD-10-CM | POA: Diagnosis not present

## 2017-11-18 DIAGNOSIS — C321 Malignant neoplasm of supraglottis: Secondary | ICD-10-CM | POA: Diagnosis not present

## 2017-11-18 DIAGNOSIS — C329 Malignant neoplasm of larynx, unspecified: Secondary | ICD-10-CM | POA: Diagnosis not present

## 2017-11-18 DIAGNOSIS — F332 Major depressive disorder, recurrent severe without psychotic features: Secondary | ICD-10-CM | POA: Diagnosis not present

## 2017-11-18 DIAGNOSIS — I509 Heart failure, unspecified: Secondary | ICD-10-CM | POA: Diagnosis not present

## 2017-11-18 DIAGNOSIS — M25551 Pain in right hip: Secondary | ICD-10-CM | POA: Diagnosis not present

## 2017-11-19 DIAGNOSIS — F410 Panic disorder [episodic paroxysmal anxiety] without agoraphobia: Secondary | ICD-10-CM | POA: Diagnosis not present

## 2017-11-19 DIAGNOSIS — C329 Malignant neoplasm of larynx, unspecified: Secondary | ICD-10-CM | POA: Diagnosis not present

## 2017-11-19 DIAGNOSIS — M25551 Pain in right hip: Secondary | ICD-10-CM | POA: Diagnosis not present

## 2017-11-19 DIAGNOSIS — I509 Heart failure, unspecified: Secondary | ICD-10-CM | POA: Diagnosis not present

## 2017-11-19 DIAGNOSIS — F332 Major depressive disorder, recurrent severe without psychotic features: Secondary | ICD-10-CM | POA: Diagnosis not present

## 2017-11-19 DIAGNOSIS — C321 Malignant neoplasm of supraglottis: Secondary | ICD-10-CM | POA: Diagnosis not present

## 2017-11-24 DIAGNOSIS — C329 Malignant neoplasm of larynx, unspecified: Secondary | ICD-10-CM | POA: Diagnosis not present

## 2017-11-24 DIAGNOSIS — M25551 Pain in right hip: Secondary | ICD-10-CM | POA: Diagnosis not present

## 2017-11-24 DIAGNOSIS — C321 Malignant neoplasm of supraglottis: Secondary | ICD-10-CM | POA: Diagnosis not present

## 2017-11-24 DIAGNOSIS — I509 Heart failure, unspecified: Secondary | ICD-10-CM | POA: Diagnosis not present

## 2017-11-24 DIAGNOSIS — F410 Panic disorder [episodic paroxysmal anxiety] without agoraphobia: Secondary | ICD-10-CM | POA: Diagnosis not present

## 2017-11-24 DIAGNOSIS — F332 Major depressive disorder, recurrent severe without psychotic features: Secondary | ICD-10-CM | POA: Diagnosis not present

## 2017-11-25 DIAGNOSIS — C329 Malignant neoplasm of larynx, unspecified: Secondary | ICD-10-CM | POA: Diagnosis not present

## 2017-11-25 DIAGNOSIS — C321 Malignant neoplasm of supraglottis: Secondary | ICD-10-CM | POA: Diagnosis not present

## 2017-11-25 DIAGNOSIS — F410 Panic disorder [episodic paroxysmal anxiety] without agoraphobia: Secondary | ICD-10-CM | POA: Diagnosis not present

## 2017-11-25 DIAGNOSIS — M25551 Pain in right hip: Secondary | ICD-10-CM | POA: Diagnosis not present

## 2017-11-25 DIAGNOSIS — I509 Heart failure, unspecified: Secondary | ICD-10-CM | POA: Diagnosis not present

## 2017-11-25 DIAGNOSIS — F332 Major depressive disorder, recurrent severe without psychotic features: Secondary | ICD-10-CM | POA: Diagnosis not present

## 2017-11-26 DIAGNOSIS — C321 Malignant neoplasm of supraglottis: Secondary | ICD-10-CM | POA: Diagnosis not present

## 2017-11-26 DIAGNOSIS — C329 Malignant neoplasm of larynx, unspecified: Secondary | ICD-10-CM | POA: Diagnosis not present

## 2017-11-26 DIAGNOSIS — M25551 Pain in right hip: Secondary | ICD-10-CM | POA: Diagnosis not present

## 2017-11-26 DIAGNOSIS — F410 Panic disorder [episodic paroxysmal anxiety] without agoraphobia: Secondary | ICD-10-CM | POA: Diagnosis not present

## 2017-11-26 DIAGNOSIS — I509 Heart failure, unspecified: Secondary | ICD-10-CM | POA: Diagnosis not present

## 2017-11-26 DIAGNOSIS — F332 Major depressive disorder, recurrent severe without psychotic features: Secondary | ICD-10-CM | POA: Diagnosis not present

## 2017-11-28 ENCOUNTER — Encounter (HOSPITAL_COMMUNITY): Payer: Self-pay | Admitting: *Deleted

## 2017-11-28 ENCOUNTER — Ambulatory Visit (INDEPENDENT_AMBULATORY_CARE_PROVIDER_SITE_OTHER): Payer: Medicare Other

## 2017-11-28 ENCOUNTER — Ambulatory Visit (HOSPITAL_COMMUNITY)
Admission: EM | Admit: 2017-11-28 | Discharge: 2017-11-28 | Disposition: A | Discharge status: Home / Self Care | Payer: Medicare Other | Attending: Family Medicine | Admitting: Family Medicine

## 2017-11-28 DIAGNOSIS — S99921A Unspecified injury of right foot, initial encounter: Secondary | ICD-10-CM | POA: Diagnosis not present

## 2017-11-28 DIAGNOSIS — M79674 Pain in right toe(s): Secondary | ICD-10-CM | POA: Diagnosis not present

## 2017-11-28 DIAGNOSIS — M79671 Pain in right foot: Secondary | ICD-10-CM | POA: Diagnosis not present

## 2017-11-28 HISTORY — DX: Fracture of unspecified part of neck of unspecified femur, initial encounter for closed fracture: S72.009A

## 2017-11-28 HISTORY — DX: Malignant (primary) neoplasm, unspecified: C80.1

## 2017-11-28 HISTORY — DX: Other specified postprocedural states: Z98.890

## 2017-11-28 MED ORDER — ACETAMINOPHEN 160 MG/5ML PO SOLN
1000.0000 mg | Freq: Once | ORAL | Status: AC
  Administered 2017-11-28: 1000 mg via ORAL

## 2017-11-28 MED ORDER — ACETAMINOPHEN 160 MG/5ML PO LIQD: 500.0000 mg | Freq: Four times a day (QID) | ORAL | 0 refills | Status: DC | PRN

## 2017-11-28 MED ORDER — ACETAMINOPHEN 160 MG/5ML PO SOLN
ORAL | Status: AC
  Filled 2017-11-28: qty 40.6

## 2017-11-28 NOTE — ED Provider Notes (Signed)
[place]    CSN: [id number] Arrival date & time: 11/28/17  1033     History   Chief Complaint Chief Complaint  Patient presents with  . Foot Injury    HPI Katelyn [name] is a 67 y.o. female.   Katelyn [name] presents with complaints of right toe pain after she accidentally struck her foot against a wall yesterday. Pain since. Pain primarily to 2nd, 3rd, and 4th proximal toes. No numbness or tingling. Pain 10/10. No previous foot injury. Hx of right hip fracture and only recently started walking with a walker again. Has not taken any medications for her pain. Hx of laryngeal ca with laryngectomy, trach in place. Hx of stroke, CKD, dvt.     ROS per HPI.      Past Medical History:  Diagnosis Date  . Acute gallstone pancreatitis 01/08/2014  . AKI (acute kidney injury) ([place]) 01/08/2014  . Anemia   . Blood transfusion    11'15 last admission  . Cancer ([place])   . Cholecystitis   . H/O tracheostomy   . Hip fracture ([place])   . Hypercholesteremia   . Hypertension   . Right leg DVT ([place]) 05/08/2014  . Septic shock - Kelbsiella 01/09/2014  . Shortness of breath   . Stroke [facility])    '13- right side weakness, ambulates with cane    Patient Active Problem List   Diagnosis Date Noted  . Generalized weakness 07/12/2017  . Palliative care encounter 07/12/2017  . 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 Pressure injury of skin 05/30/2017  . Malnutrition of moderate degree 05/30/2017  . Hip fx, right, closed, initial encounter ([place]) 05/29/2017  . Closed displaced intertrochanteric fracture of right femur ([place]) 05/29/2017  . Dysphagia 05/29/2017  . Weight loss 05/29/2017  . Difficult airway for intubation 05/29/2017  . Lesion of epiglottis 05/26/2014  . Abnormal CT scan, pancreas or bile duct   . Right leg DVT ([place]) 05/08/2014  . Anemia 01/09/2014  . Hip pain, right 05/06/2011  . 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 CKD (chronic kidney disease)  stage 3, GFR 30-59 ml/min (HCC) 03/04/2011  . Diastolic dysfunction 08/17/7626  . Tobacco abuse 03/04/2011  . Constipation 03/04/2011  . Malignant hypertensive urgency 03/01/2011  . CVA (cerebral infarction) 03/01/2011  . NEUROPATHY, IDIOPATHIC 10/25/2007  . Hypercholesteremia 12/15/2006  . Essential hypertension 12/15/2006    Past Surgical History:  Procedure Laterality Date  . ABDOMINAL HYSTERECTOMY    . DIRECT LARYNGOSCOPY N/A 06/08/2017   Procedure: DIRECT LARYNGOSCOPY WITH BIOPSY;  Surgeon: Helayne Seminole, MD;  Location: [facility] OR;  Service: ENT;  Laterality: N/A;  . ENDOSCOPIC RETROGRADE CHOLANGIOPANCREATOGRAPHY (ERCP) WITH PROPOFOL N/A 01/11/2014   Procedure: ENDOSCOPIC RETROGRADE CHOLANGIOPANCREATOGRAPHY (ERCP) WITH PROPOFOL;  Surgeon: Gatha Mayer, MD;  Location: [place];  Service: Endoscopy;  Laterality: N/A;  . ERCP    . ERCP N/A 05/26/2014   Procedure: ENDOSCOPIC RETROGRADE CHOLANGIOPANCREATOGRAPHY (ERCP);  Surgeon: Gatha Mayer, MD;  Location: [facility];  Service: Endoscopy;  Laterality: N/A;  . ESOPHAGOGASTRODUODENOSCOPY (EGD) WITH PROPOFOL N/A 12/07/2014   Procedure: ESOPHAGOGASTRODUODENOSCOPY (EGD) WITH PROPOFOL;  Surgeon: Milus Banister, MD;  Location: [facility] ENDOSCOPY;  Service: Endoscopy;  Laterality: N/A;  . EUS N/A 12/07/2014   Procedure: UPPER ENDOSCOPIC ULTRASOUND (EUS) LINEAR;  Surgeon: Milus Banister, MD;  Location: [facility] ENDOSCOPY;  Service: Endoscopy;  Laterality: N/A;  . GASTROINTESTINAL STENT REMOVAL N/A 12/07/2014   Procedure: GASTROINTESTINAL STENT REMOVAL;  Surgeon: Milus Banister, MD;  Location: [facility]  END[facility];  Service: Endoscopy;  Laterality: N/A;  pancreatic stent removal  . HIP FRACTURE SURGERY    . INTRAMEDULLARY (IM) NAIL INTERTROCHANTERIC Right 05/29/2017   Procedure: INTRAMEDULLARY (IM) NAIL INTERTROCHANTRIC FEMUR;  Surgeon: Nicholes Stairs, MD;  Location: [place];  Service: Orthopedics;  Laterality: Right;  . PERCUTANEOUS TRACHEOSTOMY N/A  05/29/2017   Procedure: PERCUTANEOUS TRACHEOSTOMY;  Surgeon: Judeth Horn, MD;  Location: [place];  Service: General;  Laterality: N/A;  . TEE WITHOUT CARDIOVERSION  03/06/2011   Procedure: TRANSESOPHAGEAL ECHOCARDIOGRAM (TEE);  Surgeon: Jolaine Artist, MD;  Location: [facility] ENDOSCOPY;  Service: Cardiovascular;  Laterality: N/A;    OB History   None      Home Medications    Prior to Admission medications   Medication Sig Start Date End Date Taking? Authorizing Provider  acetaminophen (TYLENOL) 160 MG/5ML liquid Take 15.6 mLs (500 mg total) by mouth every 6 (six) hours as needed for pain. 11/28/17   Zigmund Gottron, NP    Family History Family History  Problem Relation Age of Onset  . Breast cancer Maternal Aunt   . Diabetes Mellitus I Father   . Diabetes Mellitus I Sister   . Anesthesia problems Neg Hx   . Hypotension Neg Hx   . Malignant hyperthermia Neg Hx   . Pseudochol deficiency Neg Hx   . Colon cancer Neg Hx   . Esophageal cancer Neg Hx   . Pancreatic cancer Neg Hx   . Kidney disease Neg Hx   . Liver disease Neg Hx     Social History Social History   Tobacco Use  . Smoking status: Former Smoker    Packs/day: 0.25    Types: Cigarettes    Last attempt to quit: 05/29/2017    Years since quitting: 0.5  . Smokeless tobacco: Never Used  Substance Use Topics  . Alcohol use: No    Alcohol/week: 0.0 standard drinks  . Drug use: No     Allergies   Patient has no known allergies.   Review of Systems Review of Systems   Physical Exam Triage Vital Signs ED Triage Vitals [11/28/17 1057]  Enc Vitals Group     BP (!) 152/100     Pulse Rate 85     Resp (!) 22     Temp 97.6 F (36.4 C)     Temp Source Oral     SpO2 96 %     Weight      Height      Head Circumference      Peak Flow      Pain Score      Pain Loc      Pain Edu?      Excl. in [place]?    No data found.  Updated Vital Signs BP (!) 152/100   Pulse 85   Temp 97.6 F (36.4 C) (Oral)   Resp (!)  22   SpO2 96%    Physical Exam  Constitutional: She is oriented to person, place, and time. She appears well-developed and well-nourished. No distress.  Cardiovascular: Normal rate, regular rhythm and normal heart sounds.  Pulmonary/Chest: Effort normal.  Trach in place, clears secretions without difficulty; non verbal; no increased work of breathing   Musculoskeletal:       Right ankle: Normal.       Right foot: There is tenderness and bony tenderness. There is normal range of motion, no swelling, normal capillary refill, no crepitus, no deformity and no laceration.  Feet:  Tenderness to proximal 2-4th toes and at MTP joints; no bruising, swelling, skin breakdown or redness; full ROM noted but with discomfort; strong pedal pulse; ankle WNL; cap refill < 2 seconds    Neurological: She is alert and oriented to person, place, and time.  Skin: Skin is warm and dry.     UC Treatments / Results  Labs (all labs ordered are listed, but only abnormal results are displayed) Labs Reviewed - No data to display  EKG None  Radiology Dg Foot Complete Right  Result Date: 11/28/2017 CLINICAL DATA:  [place] injuring RIGHT foot, blunt trauma, pain with movement EXAM: RIGHT FOOT COMPLETE - 3+ VIEW COMPARISON:  None FINDINGS: Osseous demineralization. Joint spaces preserved. No acute fracture, dislocation or bone destruction. Dorsal soft tissue swelling RIGHT foot overlying the MTP joints and distal metatarsals IMPRESSION: Osseous demineralization without acute bony abnormalities. Electronically Signed   By: Lavonia Dana M.D.   On: 11/28/2017 11:29    Procedures Procedures (including critical care time)  Medications Ordered in UC Medications  acetaminophen (TYLENOL) solution 1,000 mg (1,000 mg Oral Given 11/28/17 1135)    Initial Impression / Assessment and Plan / UC Course  I have reviewed the triage vital signs and the nursing notes.  Pertinent labs & imaging results that were available  during my care of the patient were reviewed by me and considered in my medical decision making (see chart for details).     Xray without acute findings of fracture. No bruising, swelling. No neurological or vascular deficits. Supportive cares recommended. Post op shoe provided for comfort with activity. Ice, elevation, tylenol for pain. Follow with PCP and/or orthopedist as needed. Patient and family  verbalized understanding and agreeable to plan.   Final Clinical Impressions(s) / UC Diagnoses   Final diagnoses:  Injury of right foot, initial encounter  Pain of toe of right foot     Discharge Instructions     No broken bones on xray today.  Consistent with bruising and possible some strain from striking the wall with your foot.  Ice, elevation as able, tylenol for pain control.  We will provide a supportive shoe which may help with pain with activity as well.  If symptoms worsen or do not improve in the next 2-3 weeks to follow up with your PCP or orthopedist.     ED Prescriptions    Medication Sig Dispense Auth. Provider   acetaminophen (TYLENOL) 160 MG/5ML liquid Take 15.6 mLs (500 mg total) by mouth every 6 (six) hours as needed for pain. 473 mL Augusto Gamble B, NP     Controlled Substance Prescriptions [place] Controlled Substance Registry consulted? Not Applicable   Zigmund Gottron, NP 11/28/17 1137

## 2017-11-28 NOTE — Discharge Instructions (Signed)
No broken bones on xray today.  Consistent with bruising and possible some strain from striking the wall with your foot.  Ice, elevation as able, tylenol for pain control.  We will provide a supportive shoe which may help with pain with activity as well.  If symptoms worsen or do not improve in the next 2-3 weeks to follow up with your PCP or orthopedist.

## 2017-11-28 NOTE — ED Triage Notes (Signed)
Pt has trach in place.  Reports recently starting to walk following recovery from hip fx; when ambulating yesterday bumped right toes.  Family states unable to bear weight.  Pt c/o pain to right foot.

## 2017-11-30 DIAGNOSIS — F332 Major depressive disorder, recurrent severe without psychotic features: Secondary | ICD-10-CM | POA: Diagnosis not present

## 2017-11-30 DIAGNOSIS — I509 Heart failure, unspecified: Secondary | ICD-10-CM | POA: Diagnosis not present

## 2017-11-30 DIAGNOSIS — F410 Panic disorder [episodic paroxysmal anxiety] without agoraphobia: Secondary | ICD-10-CM | POA: Diagnosis not present

## 2017-11-30 DIAGNOSIS — C329 Malignant neoplasm of larynx, unspecified: Secondary | ICD-10-CM | POA: Diagnosis not present

## 2017-11-30 DIAGNOSIS — C321 Malignant neoplasm of supraglottis: Secondary | ICD-10-CM | POA: Diagnosis not present

## 2017-11-30 DIAGNOSIS — M25551 Pain in right hip: Secondary | ICD-10-CM | POA: Diagnosis not present

## 2017-12-01 DIAGNOSIS — I10 Essential (primary) hypertension: Secondary | ICD-10-CM | POA: Diagnosis not present

## 2017-12-01 DIAGNOSIS — Z23 Encounter for immunization: Secondary | ICD-10-CM | POA: Diagnosis not present

## 2017-12-01 DIAGNOSIS — F324 Major depressive disorder, single episode, in partial remission: Secondary | ICD-10-CM | POA: Diagnosis not present

## 2017-12-03 DIAGNOSIS — I509 Heart failure, unspecified: Secondary | ICD-10-CM | POA: Diagnosis not present

## 2017-12-03 DIAGNOSIS — F410 Panic disorder [episodic paroxysmal anxiety] without agoraphobia: Secondary | ICD-10-CM | POA: Diagnosis not present

## 2017-12-03 DIAGNOSIS — C329 Malignant neoplasm of larynx, unspecified: Secondary | ICD-10-CM | POA: Diagnosis not present

## 2017-12-03 DIAGNOSIS — C321 Malignant neoplasm of supraglottis: Secondary | ICD-10-CM | POA: Diagnosis not present

## 2017-12-03 DIAGNOSIS — F332 Major depressive disorder, recurrent severe without psychotic features: Secondary | ICD-10-CM | POA: Diagnosis not present

## 2017-12-03 DIAGNOSIS — M25551 Pain in right hip: Secondary | ICD-10-CM | POA: Diagnosis not present

## 2017-12-04 DIAGNOSIS — I509 Heart failure, unspecified: Secondary | ICD-10-CM | POA: Diagnosis not present

## 2017-12-04 DIAGNOSIS — M25551 Pain in right hip: Secondary | ICD-10-CM | POA: Diagnosis not present

## 2017-12-04 DIAGNOSIS — C329 Malignant neoplasm of larynx, unspecified: Secondary | ICD-10-CM | POA: Diagnosis not present

## 2017-12-04 DIAGNOSIS — F332 Major depressive disorder, recurrent severe without psychotic features: Secondary | ICD-10-CM | POA: Diagnosis not present

## 2017-12-04 DIAGNOSIS — C321 Malignant neoplasm of supraglottis: Secondary | ICD-10-CM | POA: Diagnosis not present

## 2017-12-04 DIAGNOSIS — F410 Panic disorder [episodic paroxysmal anxiety] without agoraphobia: Secondary | ICD-10-CM | POA: Diagnosis not present

## 2017-12-07 DIAGNOSIS — C329 Malignant neoplasm of larynx, unspecified: Secondary | ICD-10-CM | POA: Diagnosis not present

## 2017-12-07 DIAGNOSIS — C321 Malignant neoplasm of supraglottis: Secondary | ICD-10-CM | POA: Diagnosis not present

## 2017-12-07 DIAGNOSIS — F410 Panic disorder [episodic paroxysmal anxiety] without agoraphobia: Secondary | ICD-10-CM | POA: Diagnosis not present

## 2017-12-07 DIAGNOSIS — M25551 Pain in right hip: Secondary | ICD-10-CM | POA: Diagnosis not present

## 2017-12-07 DIAGNOSIS — I509 Heart failure, unspecified: Secondary | ICD-10-CM | POA: Diagnosis not present

## 2017-12-07 DIAGNOSIS — F332 Major depressive disorder, recurrent severe without psychotic features: Secondary | ICD-10-CM | POA: Diagnosis not present

## 2017-12-11 DIAGNOSIS — C321 Malignant neoplasm of supraglottis: Secondary | ICD-10-CM | POA: Diagnosis not present

## 2017-12-11 DIAGNOSIS — F410 Panic disorder [episodic paroxysmal anxiety] without agoraphobia: Secondary | ICD-10-CM | POA: Diagnosis not present

## 2017-12-11 DIAGNOSIS — C329 Malignant neoplasm of larynx, unspecified: Secondary | ICD-10-CM | POA: Diagnosis not present

## 2017-12-11 DIAGNOSIS — I509 Heart failure, unspecified: Secondary | ICD-10-CM | POA: Diagnosis not present

## 2017-12-11 DIAGNOSIS — F332 Major depressive disorder, recurrent severe without psychotic features: Secondary | ICD-10-CM | POA: Diagnosis not present

## 2017-12-11 DIAGNOSIS — M25551 Pain in right hip: Secondary | ICD-10-CM | POA: Diagnosis not present

## 2018-01-17 ENCOUNTER — Encounter (HOSPITAL_COMMUNITY): Payer: Self-pay | Admitting: Emergency Medicine

## 2018-01-17 ENCOUNTER — Emergency Department (HOSPITAL_COMMUNITY): Payer: Medicare Other

## 2018-01-17 ENCOUNTER — Other Ambulatory Visit: Payer: Self-pay

## 2018-01-17 ENCOUNTER — Emergency Department (HOSPITAL_COMMUNITY)
Admission: EM | Admit: 2018-01-17 | Discharge: 2018-01-17 | Disposition: A | Discharge status: Home / Self Care | Payer: Medicare Other | Attending: Emergency Medicine | Admitting: Emergency Medicine

## 2018-01-17 DIAGNOSIS — J209 Acute bronchitis, unspecified: Secondary | ICD-10-CM | POA: Diagnosis not present

## 2018-01-17 DIAGNOSIS — I129 Hypertensive chronic kidney disease with stage 1 through stage 4 chronic kidney disease, or unspecified chronic kidney disease: Secondary | ICD-10-CM | POA: Diagnosis not present

## 2018-01-17 DIAGNOSIS — N183 Chronic kidney disease, stage 3 (moderate): Secondary | ICD-10-CM | POA: Diagnosis not present

## 2018-01-17 DIAGNOSIS — Z79899 Other long term (current) drug therapy: Secondary | ICD-10-CM | POA: Insufficient documentation

## 2018-01-17 DIAGNOSIS — Z87891 Personal history of nicotine dependence: Secondary | ICD-10-CM | POA: Insufficient documentation

## 2018-01-17 DIAGNOSIS — R05 Cough: Secondary | ICD-10-CM | POA: Diagnosis present

## 2018-01-17 DIAGNOSIS — R042 Hemoptysis: Secondary | ICD-10-CM | POA: Diagnosis not present

## 2018-01-17 LAB — CBC WITH DIFFERENTIAL/PLATELET
Abs Immature Granulocytes: 0.01 10*3/uL (ref 0.00–0.07)
Basophils Absolute: 0 10*3/uL (ref 0.0–0.1)
Basophils Relative: 0 %
Eosinophils Absolute: 0.1 10*3/uL (ref 0.0–0.5)
Eosinophils Relative: 1 %
HEMATOCRIT: 40.1 % (ref 36.0–46.0)
Hemoglobin: 11.8 g/dL — ABNORMAL LOW (ref 12.0–15.0)
IMMATURE GRANULOCYTES: 0 %
Lymphocytes Relative: 39 %
Lymphs Abs: 2.7 10*3/uL (ref 0.7–4.0)
MCH: 28.4 pg (ref 26.0–34.0)
MCHC: 29.4 g/dL — ABNORMAL LOW (ref 30.0–36.0)
MCV: 96.4 fL (ref 80.0–100.0)
Monocytes Absolute: 0.6 10*3/uL (ref 0.1–1.0)
Monocytes Relative: 9 %
NEUTROS ABS: 3.5 10*3/uL (ref 1.7–7.7)
NEUTROS PCT: 51 %
NRBC: 0 % (ref 0.0–0.2)
Platelets: 197 10*3/uL (ref 150–400)
RBC: 4.16 MIL/uL (ref 3.87–5.11)
RDW: 12.2 % (ref 11.5–15.5)
WBC: 6.9 10*3/uL (ref 4.0–10.5)

## 2018-01-17 LAB — BASIC METABOLIC PANEL
Anion gap: 8 (ref 5–15)
BUN: 24 mg/dL — ABNORMAL HIGH (ref 8–23)
CALCIUM: 9.8 mg/dL (ref 8.9–10.3)
CHLORIDE: 105 mmol/L (ref 98–111)
CO2: 25 mmol/L (ref 22–32)
Creatinine, Ser: 0.86 mg/dL (ref 0.44–1.00)
GFR calc Af Amer: >60 mL/min (ref >60)
GFR calc non Af Amer: >60 mL/min (ref >60)
GLUCOSE: 101 mg/dL — AB (ref 70–99)
POTASSIUM: 4.4 mmol/L (ref 3.5–5.1)
Sodium: 138 mmol/L (ref 135–145)

## 2018-01-17 MED ORDER — AZITHROMYCIN 250 MG PO TABS: ORAL_TABLET | ORAL | 0 refills | Status: DC

## 2018-01-17 NOTE — Discharge Instructions (Addendum)
Make sure that you are drinking plenty of water daily to improve your symptoms.  You can use Robitussin-DM as needed for cough.  If you develop fever use Tylenol every 4 hours.

## 2018-01-17 NOTE — ED Triage Notes (Signed)
Patient arrived from home with family. Patient's daughter reports that patient has been throwing up blood. Patient states she threw up twice and did not agree that there was blood; patient has a capped trach. Daughter reports that patient has not been herself.  Both patient and daughter could not come to an agreement on patient's condition. Patient denies pain; laying comfortably in the bed.

## 2018-01-17 NOTE — ED Provider Notes (Signed)
[facility] EMERGENCY DEPARTMENT Provider Note   CSN: [id number] Arrival date & time: 01/17/18  1404     History   Chief Complaint No chief complaint on file.   HPI Katelyn [name] is a 67 y.o. female.  HPI   She presents for evaluation of cough with sputum production, mucus and blood, twice in the last 2 days.  Similar problem 1 week ago.  She denies fever, chills, nausea, vomiting, weakness or dizziness.  She is taking her usual medications without relief.  History by daughter who is with her.  Patient has a tracheostomy and cannot verbalize words.  She can communicate and mouth words but it is difficult to understand her.  There are no other known modifying factors.  Past Medical History:  Diagnosis Date  . Acute gallstone pancreatitis 01/08/2014  . AKI (acute kidney injury) ([place]) 01/08/2014  . Anemia   . Blood transfusion    11'15 last admission  . Cancer ([place])   . Cholecystitis   . H/O tracheostomy   . Hip fracture ([place])   . Hypercholesteremia   . Hypertension   . Right leg DVT ([place]) 05/08/2014  . Septic shock - Kelbsiella 01/09/2014  . Shortness of breath   . Stroke [facility])    '13- right side weakness, ambulates with cane    Patient Active Problem List   Diagnosis Date Noted  . Generalized weakness 07/12/2017  . Palliative care encounter 07/12/2017  . 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 Pressure injury of skin 05/30/2017  . Malnutrition of moderate degree 05/30/2017  . Hip fx, right, closed, initial encounter ([place]) 05/29/2017  . Closed displaced intertrochanteric fracture of right femur ([place]) 05/29/2017  . Dysphagia 05/29/2017  . Weight loss 05/29/2017  . Difficult airway for intubation 05/29/2017  . Lesion of epiglottis 05/26/2014  . Abnormal CT scan, pancreas or bile duct   . Right leg DVT ([place]) 05/08/2014  . Anemia 01/09/2014  . Hip pain, right 05/06/2011  . 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 CKD (chronic  kidney disease) stage 3, GFR 30-59 ml/min ([facility]) 03/04/2011  . Diastolic dysfunction 53/66/4403  . Tobacco abuse 03/04/2011  . Constipation 03/04/2011  . Malignant hypertensive urgency 03/01/2011  . CVA (cerebral infarction) 03/01/2011  . NEUROPATHY, IDIOPATHIC 10/25/2007  . Hypercholesteremia 12/15/2006  . Essential hypertension 12/15/2006    Past Surgical History:  Procedure Laterality Date  . ABDOMINAL HYSTERECTOMY    . DIRECT LARYNGOSCOPY N/A 06/08/2017   Procedure: DIRECT LARYNGOSCOPY WITH BIOPSY;  Surgeon: Helayne Seminole, MD;  Location: [facility] OR;  Service: ENT;  Laterality: N/A;  . ENDOSCOPIC RETROGRADE CHOLANGIOPANCREATOGRAPHY (ERCP) WITH PROPOFOL N/A 01/11/2014   Procedure: ENDOSCOPIC RETROGRADE CHOLANGIOPANCREATOGRAPHY (ERCP) WITH PROPOFOL;  Surgeon: Gatha Mayer, MD;  Location: [place];  Service: Endoscopy;  Laterality: N/A;  . ERCP    . ERCP N/A 05/26/2014   Procedure: ENDOSCOPIC RETROGRADE CHOLANGIOPANCREATOGRAPHY (ERCP);  Surgeon: Gatha Mayer, MD;  Location: [facility];  Service: Endoscopy;  Laterality: N/A;  . ESOPHAGOGASTRODUODENOSCOPY (EGD) WITH PROPOFOL N/A 12/07/2014   Procedure: ESOPHAGOGASTRODUODENOSCOPY (EGD) WITH PROPOFOL;  Surgeon: Milus Banister, MD;  Location: [facility] ENDOSCOPY;  Service: Endoscopy;  Laterality: N/A;  . EUS N/A 12/07/2014   Procedure: UPPER ENDOSCOPIC ULTRASOUND (EUS) LINEAR;  Surgeon: Milus Banister, MD;  Location: [facility] ENDOSCOPY;  Service: Endoscopy;  Laterality: N/A;  . GASTROINTESTINAL STENT REMOVAL N/A 12/07/2014   Procedure: GASTROINTESTINAL STENT REMOVAL;  Surgeon: Milus Banister, MD;  Location: [facility] ENDOSCOPY;  Service: Endoscopy;  Laterality: N/A;  pancreatic stent removal  . HIP FRACTURE SURGERY    . INTRAMEDULLARY (IM) NAIL INTERTROCHANTERIC Right 05/29/2017   Procedure: INTRAMEDULLARY (IM) NAIL INTERTROCHANTRIC FEMUR;  Surgeon: Nicholes Stairs, MD;  Location: [facility];  Service: Orthopedics;  Laterality: Right;  . PERCUTANEOUS  TRACHEOSTOMY N/A 05/29/2017   Procedure: PERCUTANEOUS TRACHEOSTOMY;  Surgeon: Judeth Horn, MD;  Location: [facility];  Service: General;  Laterality: N/A;  . TEE WITHOUT CARDIOVERSION  03/06/2011   Procedure: TRANSESOPHAGEAL ECHOCARDIOGRAM (TEE);  Surgeon: Jolaine Artist, MD;  Location[facility] ENDOSCOPY;  Service: Cardiovascular;  Laterality: N/A;     OB History   None      Home Medications    Prior to Admission medications   Medication Sig Start Date End Date Taking? Authorizing Provider  acetaminophen (TYLENOL) 160 MG/5ML liquid Take 15.6 mLs (500 mg total) by mouth every 6 (six) hours as needed for pain. 11/28/17   Zigmund Gottron, NP  azithromycin (ZITHROMAX Z-PAK) 250 MG tablet 2 po day one, then 1 daily x 4 days 01/17/18   Daleen Bo, MD    Family History Family History  Problem Relation Age of Onset  . Breast cancer Maternal Aunt   . Diabetes Mellitus I Father   . Diabetes Mellitus I Sister   . Anesthesia problems Neg Hx   . Hypotension Neg Hx   . Malignant hyperthermia Neg Hx   . Pseudochol deficiency Neg Hx   . Colon cancer Neg Hx   . Esophageal cancer Neg Hx   . Pancreatic cancer Neg Hx   . Kidney disease Neg Hx   . Liver disease Neg Hx     Social History Social History   Tobacco Use  . Smoking status: Former Smoker    Packs/day: 0.25    Types: Cigarettes    Last attempt to quit: 05/29/2017    Years since quitting: 0.6  . Smokeless tobacco: Never Used  Substance Use Topics  . Alcohol use: No    Alcohol/week: 0.0 standard drinks  . Drug use: No     Allergies   Patient has no known allergies.   Review of Systems Review of Systems  All other systems reviewed and are negative.    Physical Exam Updated Vital Signs BP 106/84   Pulse 84   Temp 98.5 F (36.9 C) (Oral)   Resp 18   Ht 4\' 11"  (1.499 m)   Wt 47.2 kg   SpO2 97%   BMI 21.01 kg/m   Physical Exam  Constitutional: She is oriented to person, place, and time. She appears well-developed  and well-nourished. No distress.  Under nourished appearing, frail, elderly  HENT:  Head: Normocephalic and atraumatic.  Right Ear: External ear normal.  Left Ear: External ear normal.  Eyes: Pupils are equal, round, and reactive to light. Conjunctivae and EOM are normal.  Neck: Normal range of motion and phonation normal. Neck supple.  Cardiovascular: Normal rate, regular rhythm and normal heart sounds.  Pulmonary/Chest: Effort normal. No respiratory distress. She exhibits no bony tenderness.  Eucerin bilaterally with scattered rhonchi.  No wheezes or rales.  No increased work of breathing.  Trach ostomy site, anterior, with cap on the appliance which is present.  Abdominal: Soft. There is no tenderness.  Musculoskeletal: Normal range of motion.  Neurological: She is alert and oriented to person, place, and time. No cranial nerve deficit or sensory deficit. She exhibits normal muscle tone. Coordination normal.  Skin: Skin is warm, dry and intact.  Psychiatric: She has a normal mood and affect. Her behavior is normal. Judgment and thought content normal.  Nursing note and vitals reviewed.    ED Treatments / Results  Labs (all labs ordered are listed, but only abnormal results are displayed) Labs Reviewed  CBC WITH DIFFERENTIAL/PLATELET - Abnormal; Notable for the following components:      Result Value   Hemoglobin 11.8 (*)    MCHC 29.4 (*)    All other components within normal limits  BASIC METABOLIC PANEL - Abnormal; Notable for the following components:   Glucose, Bld 101 (*)    BUN 24 (*)    All other components within normal limits    EKG None  Radiology Dg Chest 2 View  Result Date: 01/17/2018 CLINICAL DATA:  Hemoptysis. EXAM: CHEST - 2 VIEW COMPARISON:  Jul 10, 2017 FINDINGS: The heart size and mediastinal contours are within normal limits. Both lungs are clear. The visualized skeletal structures are unremarkable. IMPRESSION: No active cardiopulmonary disease.  Electronically Signed   By: Dorise Bullion III M.D   On: 01/17/2018 16:32    Procedures Procedures (including critical care time)  Medications Ordered in ED Medications - No data to display   Initial Impression / Assessment and Plan / ED Course  I have reviewed the triage vital signs and the nursing notes.  Pertinent labs & imaging results that were available during my care of the patient were reviewed by me and considered in my medical decision making (see chart for details).  Clinical Course as of Jan 17 1700  Sun Jan 17, 2018  1652 Normal except glucose high, BUN high  Basic metabolic panel(!) [EW]  2025 Normal except hemoglobin low  CBC with Differential(!) [EW]  1652 No CHF, or infiltrate, images reviewed by me  DG Chest 2 View [EW]    Clinical Course User Index [EW] Daleen Bo, MD     Patient Vitals for the past 24 hrs:  BP Temp Temp src Pulse Resp SpO2 Height Weight  01/17/18 1500 106/84 - - 84 18 97 % - -  01/17/18 1422 - 98.5 F (36.9 C) Oral - - - 4\' 11"  (1.499 m) 47.2 kg  01/17/18 1415 (!) 162/108 - - 86 - 99 % - -    4:55 PM Reevaluation with update and discussion. After initial assessment and treatment, an updated evaluation reveals she remains comfortable and has no further complaints.  Findings discussed with patient and 2 family members in the room with her at the time.  All questions were answered. Daleen Bo   Medical Decision Making: Hemoptysis, with sputum production, and reassuring evaluation.  Doubt pneumonia, congestive heart failure, metabolic instability or impending vascular collapse.  CRITICAL CARE-no Performed by: Daleen Bo   Nursing Notes Reviewed/ Care Coordinated Applicable Imaging Reviewed Interpretation of Laboratory Data incorporated into ED treatment  The patient appears reasonably screened and/or stabilized for discharge and I doubt any other medical condition or other [facility] requiring further screening, evaluation, or  treatment in the ED at this time prior to discharge.  Plan: Home Medications-continue routine medications, use Robitussin-DM and Tylenol as needed; Home Treatments-rest, increase oral water intake; return here if the recommended treatment, does not improve the symptoms; Recommended follow up-PCP as needed.     Final Clinical Impressions(s) / ED Diagnoses   Final diagnoses:  Acute bronchitis, unspecified organism    ED Discharge Orders         Ordered    azithromycin (ZITHROMAX Z-PAK) 250 MG tablet  01/17/18 1700           Daleen Bo, MD 01/17/18 1701

## 2018-01-17 NOTE — ED Notes (Signed)
D/c reviewed with patient and family 

## 2018-03-30 DIAGNOSIS — S72141D Displaced intertrochanteric fracture of right femur, subsequent encounter for closed fracture with routine healing: Secondary | ICD-10-CM | POA: Diagnosis not present

## 2018-04-12 ENCOUNTER — Emergency Department (HOSPITAL_COMMUNITY): Payer: Medicare Other

## 2018-04-12 ENCOUNTER — Encounter (HOSPITAL_COMMUNITY): Payer: Self-pay

## 2018-04-12 ENCOUNTER — Emergency Department (HOSPITAL_COMMUNITY)
Admission: EM | Admit: 2018-04-12 | Discharge: 2018-04-12 | Disposition: A | Discharge status: Home / Self Care | Payer: Medicare Other | Attending: Emergency Medicine | Admitting: Emergency Medicine

## 2018-04-12 ENCOUNTER — Other Ambulatory Visit: Payer: Self-pay

## 2018-04-12 DIAGNOSIS — Z8521 Personal history of malignant neoplasm of larynx: Secondary | ICD-10-CM | POA: Insufficient documentation

## 2018-04-12 DIAGNOSIS — Z87891 Personal history of nicotine dependence: Secondary | ICD-10-CM | POA: Insufficient documentation

## 2018-04-12 DIAGNOSIS — N183 Chronic kidney disease, stage 3 (moderate): Secondary | ICD-10-CM | POA: Diagnosis not present

## 2018-04-12 DIAGNOSIS — R0602 Shortness of breath: Secondary | ICD-10-CM | POA: Diagnosis not present

## 2018-04-12 DIAGNOSIS — I129 Hypertensive chronic kidney disease with stage 1 through stage 4 chronic kidney disease, or unspecified chronic kidney disease: Secondary | ICD-10-CM | POA: Diagnosis not present

## 2018-04-12 DIAGNOSIS — Z930 Tracheostomy status: Secondary | ICD-10-CM | POA: Insufficient documentation

## 2018-04-12 DIAGNOSIS — Z8673 Personal history of transient ischemic attack (TIA), and cerebral infarction without residual deficits: Secondary | ICD-10-CM | POA: Insufficient documentation

## 2018-04-12 DIAGNOSIS — R000 Tachycardia, unspecified: Secondary | ICD-10-CM | POA: Diagnosis not present

## 2018-04-12 LAB — POCT I-STAT EG7
Bicarbonate: 23.6 mmol/L (ref 20.0–28.0)
CALCIUM ION: 1.14 mmol/L — AB (ref 1.15–1.40)
HCT: 39 % (ref 36.0–46.0)
Hemoglobin: 13.3 g/dL (ref 12.0–15.0)
O2 Saturation: 89 %
PCO2 VEN: 33.2 mmHg — AB (ref 44.0–60.0)
PH VEN: 7.458 — AB (ref 7.250–7.430)
Potassium: 4.4 mmol/L (ref 3.5–5.1)
Sodium: 141 mmol/L (ref 135–145)
TCO2: 25 mmol/L (ref 22–32)
pO2, Ven: 52 mmHg — ABNORMAL HIGH (ref 32.0–45.0)

## 2018-04-12 LAB — BASIC METABOLIC PANEL
ANION GAP: 11 (ref 5–15)
BUN: 14 mg/dL (ref 8–23)
CO2: 24 mmol/L (ref 22–32)
Calcium: 9.9 mg/dL (ref 8.9–10.3)
Chloride: 106 mmol/L (ref 98–111)
Creatinine, Ser: 0.91 mg/dL (ref 0.44–1.00)
GFR calc Af Amer: >60 mL/min (ref >60)
GLUCOSE: 96 mg/dL (ref 70–99)
POTASSIUM: 4.9 mmol/L (ref 3.5–5.1)
Sodium: 141 mmol/L (ref 135–145)

## 2018-04-12 LAB — CBC
HCT: 42.3 % (ref 36.0–46.0)
HEMOGLOBIN: 12.7 g/dL (ref 12.0–15.0)
MCH: 28.7 pg (ref 26.0–34.0)
MCHC: 30 g/dL (ref 30.0–36.0)
MCV: 95.7 fL (ref 80.0–100.0)
Platelets: 211 10*3/uL (ref 150–400)
RBC: 4.42 MIL/uL (ref 3.87–5.11)
RDW: 12 % (ref 11.5–15.5)
WBC: 6.6 10*3/uL (ref 4.0–10.5)
nRBC: 0 % (ref 0.0–0.2)

## 2018-04-12 LAB — LACTIC ACID, PLASMA: Lactic Acid, Venous: 1.5 mmol/L (ref 0.5–1.9)

## 2018-04-12 LAB — I-STAT TROPONIN, ED: Troponin i, poc: 0.02 ng/mL (ref 0.00–0.08)

## 2018-04-12 LAB — D-DIMER, QUANTITATIVE (NOT AT ARMC): D DIMER QUANT: 2.45 ug{FEU}/mL — AB (ref 0.00–0.50)

## 2018-04-12 MED ORDER — LORAZEPAM 2 MG/ML IJ SOLN
0.5000 mg | Freq: Once | INTRAMUSCULAR | Status: AC
  Administered 2018-04-12: 0.5 mg via INTRAVENOUS
  Filled 2018-04-12: qty 1

## 2018-04-12 MED ORDER — IOPAMIDOL (ISOVUE-370) INJECTION 76%
100.0000 mL | Freq: Once | INTRAVENOUS | Status: AC | PRN
  Administered 2018-04-12: 50 mL via INTRAVENOUS

## 2018-04-12 MED ORDER — SODIUM CHLORIDE 0.9% FLUSH
3.0000 mL | Freq: Once | INTRAVENOUS | Status: AC
  Administered 2018-04-12: 3 mL via INTRAVENOUS

## 2018-04-12 MED ORDER — ALBUTEROL SULFATE (2.5 MG/3ML) 0.083% IN NEBU: 2.5000 mg | INHALATION_SOLUTION | Freq: Four times a day (QID) | RESPIRATORY_TRACT | 12 refills | Status: DC | PRN

## 2018-04-12 MED ORDER — NYSTATIN 100000 UNIT/GM EX CREA: TOPICAL_CREAM | CUTANEOUS | 0 refills | Status: DC

## 2018-04-12 MED ORDER — ALBUTEROL SULFATE HFA 108 (90 BASE) MCG/ACT IN AERS: 2.0000 | INHALATION_SPRAY | RESPIRATORY_TRACT | 3 refills | Status: DC | PRN

## 2018-04-12 NOTE — Progress Notes (Signed)
RT note: RT suction patient through Lary tube allowing patient to cough secretion to  Catheter. RT obtained an approximately 10 inch thick stringy mucus plug. The plug was yellow to tan in color. Patient tolerated well and increased WOB improved.Vital signs stable at this time.

## 2018-04-12 NOTE — ED Provider Notes (Signed)
[facility] EMERGENCY DEPARTMENT Provider Note   CSN: [id number] Arrival date & time: 04/12/18  1046     History   Chief Complaint Chief Complaint  Patient presents with  . Shortness of Breath    HPI Katelyn [name] is a 68 y.o. female.  HPI   68 year old female with history of laryngeal cancer, post total laryngectomy, neck dissection, with LaryTube in place seen by Dr. Vicie Mutters ENT at [facility], hypertension, hyperlipidemia, CVA, DVT, presents with concern for shortness of breath.  Reports she woke up very short of breath this AM.  No chest pain, no fevers, no change in cough.  Sister notes she does not let them suction the tube, when she removed humidifier today had small amount of blood over area.  No nausea, no vomiting.    Past Medical History:  Diagnosis Date  . Acute gallstone pancreatitis 01/08/2014  . AKI (acute kidney injury) ([place]) 01/08/2014  . Anemia   . Blood transfusion    11'15 last admission  . Cancer ([place])   . Cholecystitis   . H/O tracheostomy   . Hip fracture ([place])   . Hypercholesteremia   . Hypertension   . Right leg DVT ([place]) 05/08/2014  . Septic shock - Kelbsiella 01/09/2014  . Shortness of breath   . Stroke [facility])    '13- right side weakness, ambulates with cane    Patient Active Problem List   Diagnosis Date Noted  . Generalized weakness 07/12/2017  . Palliative care encounter 07/12/2017  . 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 Pressure injury of skin 05/30/2017  . Malnutrition of moderate degree 05/30/2017  . Hip fx, right, closed, initial encounter ([place]) 05/29/2017  . Closed displaced intertrochanteric fracture of right femur ([place]) 05/29/2017  . Dysphagia 05/29/2017  . Weight loss 05/29/2017  . Difficult airway for intubation 05/29/2017  . Lesion of epiglottis 05/26/2014  . Abnormal CT scan, pancreas or bile duct   . Right leg DVT ([place]) 05/08/2014  . Anemia 01/09/2014  . Hip pain, right 05/06/2011  . 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 CKD (chronic kidney disease) stage 3, GFR 30-59 ml/min (H[facility]) 03/04/2011  . Diastolic dysfunction 18/84/1660  . Tobacco abuse 03/04/2011  . Constipation 03/04/2011  . Malignant hypertensive urgency 03/01/2011  . CVA (cerebral infarction) 03/01/2011  . NEUROPATHY, IDIOPATHIC 10/25/2007  . Hypercholesteremia 12/15/2006  . Essential hypertension 12/15/2006    Past Surgical History:  Procedure Laterality Date  . ABDOMINAL HYSTERECTOMY    . DIRECT LARYNGOSCOPY N/A 06/08/2017   Procedure: DIRECT LARYNGOSCOPY WITH BIOPSY;  Surgeon: Helayne Seminole, MD;  Location: [facility] OR;  Service: ENT;  Laterality: N/A;  . ENDOSCOPIC RETROGRADE CHOLANGIOPANCREATOGRAPHY (ERCP) WITH PROPOFOL N/A 01/11/2014   Procedure: ENDOSCOPIC RETROGRADE CHOLANGIOPANCREATOGRAPHY (ERCP) WITH PROPOFOL;  Surgeon: Gatha Mayer, MD;  Location: [facility];  Service: Endoscopy;  Laterality: N/A;  . ERCP    . ERCP N/A 05/26/2014   Procedure: ENDOSCOPIC RETROGRADE CHOLANGIOPANCREATOGRAPHY (ERCP);  Surgeon: Gatha Mayer, MD;  Location: [facility] END[facility];  Service: Endoscopy;  Laterality: N/A;  . ESOPHAGOGASTRODUODENOSCOPY (EGD) WITH PROPOFOL N/A 12/07/2014   Procedure: ESOPHAGOGASTRODUODENOSCOPY (EGD) WITH PROPOFOL;  Surgeon: Milus Banister, MD;  Location: [facility] ENDOSCOPY;  Service: Endoscopy;  Laterality: N/A;  . EUS N/A 12/07/2014   Procedure: UPPER ENDOSCOPIC ULTRASOUND (EUS) LINEAR;  Surgeon: Milus Banister, MD;  Location: [facility] ENDOSCOPY;  Service: Endoscopy;  Laterality: N/A;  . GASTROINTESTINAL STENT REMOVAL N/A 12/07/2014   Procedure: GASTROINTESTINAL STENT REMOVAL;  Surgeon: Milus Banister, MD;  Location: [facility];  Service: Endoscopy;  Laterality: N/A;  pancreatic stent removal  . HIP FRACTURE SURGERY    . INTRAMEDULLARY (IM) NAIL INTERTROCHANTERIC Right 05/29/2017   Procedure: INTRAMEDULLARY (IM) NAIL INTERTROCHANTRIC FEMUR;  Surgeon: Nicholes Stairs, MD;  Location: [facility];   Service: Orthopedics;  Laterality: Right;  . PERCUTANEOUS TRACHEOSTOMY N/A 05/29/2017   Procedure: PERCUTANEOUS TRACHEOSTOMY;  Surgeon: Judeth Horn, MD;  Location: [facility];  Service: General;  Laterality: N/A;  . TEE WITHOUT CARDIOVERSION  03/06/2011   Procedure: TRANSESOPHAGEAL ECHOCARDIOGRAM (TEE);  Surgeon: Jolaine Artist, MD;  Location: [facility] ENDOSCOPY;  Service: Cardiovascular;  Laterality: N/A;     OB History   No obstetric history on file.      Home Medications    Prior to Admission medications   Medication Sig Start Date End Date Taking? Authorizing Provider  acetaminophen (TYLENOL) 160 MG/5ML liquid Take 15.6 mLs (500 mg total) by mouth every 6 (six) hours as needed for pain. 11/28/17  Yes Burky, Lanelle Bal B, NP  naproxen (NAPROSYN) 500 MG tablet Take 500 mg by mouth 2 (two) times daily as needed for mild pain.    Yes [provider]  albuterol (PROVENTIL HFA;VENTOLIN HFA) 108 (90 Base) MCG/ACT inhaler Inhale 2 puffs into the lungs every 4 (four) hours as needed for wheezing or shortness of breath. 04/12/18   Noemi Chapel, MD  albuterol (PROVENTIL) (2.5 MG/3ML) 0.083% nebulizer solution Take 3 mLs (2.5 mg total) by nebulization every 6 (six) hours as needed for wheezing or shortness of breath. 04/12/18   Noemi Chapel, MD  nystatin cream (MYCOSTATIN) Apply to affected area 2 times daily 04/12/18   Noemi Chapel, MD    Family History Family History  Problem Relation Age of Onset  . Breast cancer Maternal Aunt   . Diabetes Mellitus I Father   . Diabetes Mellitus I Sister   . Anesthesia problems Neg Hx   . Hypotension Neg Hx   . Malignant hyperthermia Neg Hx   . Pseudochol deficiency Neg Hx   . Colon cancer Neg Hx   . Esophageal cancer Neg Hx   . Pancreatic cancer Neg Hx   . Kidney disease Neg Hx   . Liver disease Neg Hx     Social History Social History   Tobacco Use  . Smoking status: Former Smoker    Packs/day: 0.25    Types: Cigarettes    Last attempt to  quit: 05/29/2017    Years since quitting: 0.8  . Smokeless tobacco: Never Used  Substance Use Topics  . Alcohol use: No    Alcohol/week: 0.0 standard drinks  . Drug use: No     Allergies   Patient has no known allergies.   Review of Systems Review of Systems   Physical Exam Updated Vital Signs BP (!) 146/97   Pulse 96   Temp 98.3 F (36.8 C) (Oral)   Resp (!) 21   SpO2 92%   Physical Exam Vitals signs and nursing note reviewed.  Constitutional:      General: She is not in acute distress.    Appearance: She is well-developed. She is not diaphoretic.     Comments: anxious  HENT:     Head: Normocephalic and atraumatic.  Eyes:     Conjunctiva/sclera: Conjunctivae normal.  Neck:     Musculoskeletal: Normal range of motion.     Comments: Tracheostomy/LaryTube in place, no sign of bleeding or obstruction  Cardiovascular:  Rate and Rhythm: Normal rate and regular rhythm.     Heart sounds: Normal heart sounds. No murmur. No friction rub. No gallop.   Pulmonary:     Effort: Tachypnea present. No respiratory distress.     Breath sounds: Normal breath sounds. No wheezing or rales.  Abdominal:     General: There is no distension.     Palpations: Abdomen is soft.     Tenderness: There is no abdominal tenderness. There is no guarding.  Musculoskeletal:        General: No tenderness.  Skin:    General: Skin is warm and dry.     Findings: No erythema or rash.  Neurological:     Mental Status: She is alert and oriented to person, place, and time.      ED Treatments / Results  Labs (all labs ordered are listed, but only abnormal results are displayed) Labs Reviewed  D-DIMER, QUANTITATIVE (NOT AT [facility]) - Abnormal; Notable for the following components:      Result Value   D-Dimer, Quant 2.45 (*)    All other components within normal limits  POCT I-STAT EG7 - Abnormal; Notable for the following components:   pH, Ven 7.458 (*)    pCO2, Ven 33.2 (*)    pO2, Ven 52.0  (*)    Calcium, Ion 1.14 (*)    All other components within normal limits  BASIC METABOLIC PANEL  CBC  LACTIC ACID, PLASMA  LACTIC ACID, PLASMA  I-STAT TROPONIN, ED    EKG EKG Interpretation  Date/Time:  Monday April 12 2018 10:52:20 EST Ventricular Rate:  109 PR Interval:  132 QRS Duration: 64 QT Interval:  326 QTC Calculation: 439 R Axis:   32 Text Interpretation:  Sinus tachycardia Nonspecific ST and T wave abnormality Abnormal ECG No significant change since last tracing Confirmed by Gareth Morgan [phone]) on 04/12/2018 10:58:00 AM Also confirmed by Gareth Morgan [phone]), editor Hattie Perch ([id number])  on 04/12/2018 11:03:25 AM   Radiology Ct Angio Chest Pe W And/or Wo Contrast  Result Date: 04/12/2018 CLINICAL DATA:  Shortness of breath.  History of epiglottic cancer. EXAM: CT ANGIOGRAPHY CHEST WITH CONTRAST TECHNIQUE: Multidetector CT imaging of the chest was performed using the standard protocol during bolus administration of intravenous contrast. Multiplanar CT image reconstructions and MIPs were obtained to evaluate the vascular anatomy. CONTRAST:  61mL ISOVUE-370 IOPAMIDOL (ISOVUE-370) INJECTION 76% COMPARISON:  CT chest dated June 02, 2017. FINDINGS: Cardiovascular: Satisfactory opacification of the pulmonary arteries to the segmental level. No evidence of pulmonary embolism. Normal heart size. No pericardial effusion. No thoracic aortic aneurysm or dissection. Coronary, aortic arch, and branch vessel atherosclerotic vascular disease. Mediastinum/Nodes: No enlarged mediastinal, hilar, or axillary lymph nodes. Large epiglottic mass again noted. Unchanged tracheostomy tube. Thyroid gland and esophagus demonstrate no significant findings. Unchanged small hiatal hernia. Lungs/Pleura: Severe bilateral lower lobe peribronchial thickening with areas of mucous impaction. Bilateral lower lobe dependent atelectasis. Mild subsegmental atelectasis in the right middle lobe.  Unchanged mild centrilobular emphysema. No suspicious pulmonary nodule. No focal consolidation, pleural effusion, or pneumothorax. Upper Abdomen: No acute abnormality. Musculoskeletal: No chest wall abnormality. No acute or significant osseous findings. Review of the MIP images confirms the above findings. IMPRESSION: 1.  No evidence of pulmonary embolism. 2. Severe bilateral lower lobe peribronchial thickening with scattered mucous impaction, likely acute on chronic airways inflammation. 3. Large epiglottic mass again noted, consistent with history of squamous cell carcinoma. No evidence of thoracic metastatic disease. 4.  Emphysema (ICD10-J43.9).  5.  Aortic atherosclerosis (ICD10-I70.0). Electronically Signed   By: Titus Dubin M.D.   On: 04/12/2018 17:14   Dg Chest Portable 1 View  Result Date: 04/12/2018 CLINICAL DATA:  Shortness of breath, tracheostomy EXAM: PORTABLE CHEST 1 VIEW COMPARISON:  Portable exam 1131 hours compared to 01/17/2018 FINDINGS: Normal heart size and pulmonary vascularity. Calcified tortuous thoracic aorta. Tracheostomy tube projects over tracheal air column. Bibasilar atelectasis. Lungs otherwise clear. No acute infiltrate, pleural effusion or pneumothorax. IMPRESSION: Bibasilar atelectasis. Electronically Signed   By: Lavonia Dana M.D.   On: 04/12/2018 11:46    Procedures Procedures (including critical care time)  Medications Ordered in ED Medications  sodium chloride flush (NS) 0.9 % injection 3 mL (3 mLs Intravenous Given 04/12/18 1309)  LORazepam (ATIVAN) injection 0.5 mg (0.5 mg Intravenous Given 04/12/18 1308)  LORazepam (ATIVAN) injection 0.5 mg (0.5 mg Intravenous Given 04/12/18 1638)  iopamidol (ISOVUE-370) 76 % injection 100 mL (50 mLs Intravenous Contrast Given 04/12/18 1651)     Initial Impression / Assessment and Plan / ED Course  I have reviewed the triage vital signs and the nursing notes.  Pertinent labs & imaging results that were available during my  care of the patient were reviewed by me and considered in my medical decision making (see chart for details).  Clinical Course as of Apr 12 2098  Mon Apr 12, 2018  1746 CT scan reviewed, labs reviewed, will discuss with patient, appears stable for discharge   [BM]    Clinical Course User Index [BM] Noemi Chapel, MD    68 year old female with history of laryngeal cancer, post total laryngectomy, neck dissection, with LaryTube in place seen by Dr. Vicie Mutters ENT at [facility], hypertension, hyperlipidemia, CVA, DVT, presents with concern for shortness of breath.  Presents with normal O2 saturations but severe tachypnea and anxiety.  Air passing through, no stridor, no wheezing, no rhonchi.  Respiratory at bedside. DDx includes RAD, ACS, PE, pneumonia, pneumothorax, mucus plugging, metabolic etiology, anxiety.   No sign of acidosis. Lactate negative. Troponin negative. No anemia or significant electrolyte abnormality.  Suctioned by RT with improvement.  DDimer positive, will obtain CT PE study.  CT pending at time of transfer of care. If negative, may be discharged.   Final Clinical Impressions(s) / ED Diagnoses   Final diagnoses:  SOB (shortness of breath)    ED Discharge Orders         Ordered    albuterol (PROVENTIL HFA;VENTOLIN HFA) 108 (90 Base) MCG/ACT inhaler  Every 4 hours PRN     04/12/18 1747    albuterol (PROVENTIL) (2.5 MG/3ML) 0.083% nebulizer solution  Every 6 hours PRN     04/12/18 [place]     04/12/18 1832    Face-to-face encounter (required for Medicare/Medicaid patients)    Comments:  I Johnna Acosta certify that this patient is under my care and that I, or a nurse practitioner or physician's assistant working with me, had a face-to-face encounter that meets the physician face-to-face encounter requirements with this patient on 04/12/2018. The encounter with the patient was in whole, or in part for the following medical condition(s) which is the primary reason for  home health care (List medical condition): The patient has mucous plugging secondary to chronic lung disease and has a laryngeal airway.  She will need assistance with humidified oxygen at home.   04/12/18 1832    nystatin cream (MYCOSTATIN)     04/12/18 1836  Gareth Morgan, MD 04/12/18 2101

## 2018-04-12 NOTE — ED Notes (Signed)
RT and Charge RN at bedside discussing inability to suction the pts trach, pt being monitored at this time

## 2018-04-12 NOTE — ED Triage Notes (Signed)
Pt has trach, reports woke up this morning very SOB. Pt very tachypnic in triage and appears to be very anxious.

## 2018-04-12 NOTE — ED Notes (Signed)
Pt to CT at this time at this time

## 2018-04-12 NOTE — ED Provider Notes (Signed)
The patient is well-appearing, she is not in any respiratory distress, the CT scan shows no evidence of infiltrate, just some mucus plugging of her airways.  The family members report that she is resistant to having any suctioning at home, she is also resistant to drinking much in the way of fluids.  I have given the patient extensive counseling in front of the family members who will join with me in helping her to be more successful at home with these guidelines.  She will be encouraged to suction at home, The albuterol will be inefective given anatomy - nothing to protect an airway.  Home health will need to work with patient about humidifing air more than currently and aggressive suctioning.  Final diagnoses:  SOB (shortness of breath)      Noemi Chapel, MD 04/12/18 1801

## 2018-04-12 NOTE — ED Notes (Signed)
RT at bedside, MD at bedside, pt encouraged to slow breathing, pt placed on 2 L [place] d/t c/o SOB, trach tube in place

## 2018-04-12 NOTE — Discharge Instructions (Addendum)
Please have your home health team work with you on aggressive suctioning and increasing the level of humidified air which will help to get up the mucous in the airways. ER for worsening symptoms including fevers, coughing or shortness of breath that is worsening.  Thank you for letting us take care of you today!  Please obtain all of your results from medical records or have your doctors office obtain the results - share them with your doctor - you should be seen at your doctors office in the next 2 days. Call today to arrange your follow up. Take the medications as prescribed. Please review all of the medicines and only take them if you do not have an allergy to them. Please be aware that if you are taking birth control pills, taking other prescriptions, ESPECIALLY ANTIBIOTICS may make the birth control ineffective - if this is the case, either do not engage in sexual activity or use alternative methods of birth control such as condoms until you have finished the medicine and your family doctor says it is OK to restart them. If you are on a blood thinner such as COUMADIN, be aware that any other medicine that you take may cause the coumadin to either work too much, or not enough - you should have your coumadin level rechecked in next 7 days if this is the case.  ?  It is also a possibility that you have an allergic reaction to any of the medicines that you have been prescribed - Everybody reacts differently to medications and while MOST people have no trouble with most medicines, you may have a reaction such as nausea, vomiting, rash, swelling, shortness of breath. If this is the case, please stop taking the medicine immediately and contact your physician.   If you were given a medication in the ED such as percocet, vicodin, or morphine, be aware that these medicines are sedating and may change your ability to take care of yourself adequately for several hours after being given this medicines - you should  not drive or take care of small children if you were given this medicine in the Emergency Department or if you have been prescribed these types of medicines. ?   You should return to the ER IMMEDIATELY if you develop severe or worsening symptoms.

## 2018-04-21 DIAGNOSIS — C329 Malignant neoplasm of larynx, unspecified: Secondary | ICD-10-CM | POA: Diagnosis not present

## 2018-04-21 DIAGNOSIS — Z8521 Personal history of malignant neoplasm of larynx: Secondary | ICD-10-CM | POA: Diagnosis not present

## 2018-04-21 DIAGNOSIS — Z9889 Other specified postprocedural states: Secondary | ICD-10-CM | POA: Diagnosis not present

## 2018-04-21 DIAGNOSIS — Z08 Encounter for follow-up examination after completed treatment for malignant neoplasm: Secondary | ICD-10-CM | POA: Diagnosis not present

## 2018-04-21 DIAGNOSIS — Z963 Presence of artificial larynx: Secondary | ICD-10-CM | POA: Diagnosis not present

## 2018-04-21 DIAGNOSIS — Z483 Aftercare following surgery for neoplasm: Secondary | ICD-10-CM | POA: Diagnosis not present

## 2018-04-21 DIAGNOSIS — Z9002 Acquired absence of larynx: Secondary | ICD-10-CM | POA: Diagnosis not present

## 2018-04-23 DIAGNOSIS — E870 Hyperosmolality and hypernatremia: Secondary | ICD-10-CM | POA: Diagnosis not present

## 2018-04-27 DIAGNOSIS — S72141D Displaced intertrochanteric fracture of right femur, subsequent encounter for closed fracture with routine healing: Secondary | ICD-10-CM | POA: Diagnosis not present

## 2018-07-16 DIAGNOSIS — S72141D Displaced intertrochanteric fracture of right femur, subsequent encounter for closed fracture with routine healing: Secondary | ICD-10-CM | POA: Diagnosis not present

## 2018-07-16 DIAGNOSIS — M7061 Trochanteric bursitis, right hip: Secondary | ICD-10-CM | POA: Diagnosis not present

## 2018-07-26 DIAGNOSIS — E7800 Pure hypercholesterolemia, unspecified: Secondary | ICD-10-CM | POA: Diagnosis not present

## 2018-07-26 DIAGNOSIS — I10 Essential (primary) hypertension: Secondary | ICD-10-CM | POA: Diagnosis not present

## 2018-07-26 DIAGNOSIS — Z87891 Personal history of nicotine dependence: Secondary | ICD-10-CM | POA: Diagnosis not present

## 2018-07-26 DIAGNOSIS — Z8673 Personal history of transient ischemic attack (TIA), and cerebral infarction without residual deficits: Secondary | ICD-10-CM | POA: Diagnosis not present

## 2018-07-26 DIAGNOSIS — F329 Major depressive disorder, single episode, unspecified: Secondary | ICD-10-CM | POA: Diagnosis not present

## 2018-07-26 DIAGNOSIS — M7061 Trochanteric bursitis, right hip: Secondary | ICD-10-CM | POA: Diagnosis not present

## 2018-07-26 DIAGNOSIS — M84451D Pathological fracture, right femur, subsequent encounter for fracture with routine healing: Secondary | ICD-10-CM | POA: Diagnosis not present

## 2018-07-26 DIAGNOSIS — Z859 Personal history of malignant neoplasm, unspecified: Secondary | ICD-10-CM | POA: Diagnosis not present

## 2018-07-29 DIAGNOSIS — M84451D Pathological fracture, right femur, subsequent encounter for fracture with routine healing: Secondary | ICD-10-CM | POA: Diagnosis not present

## 2018-07-29 DIAGNOSIS — I10 Essential (primary) hypertension: Secondary | ICD-10-CM | POA: Diagnosis not present

## 2018-07-29 DIAGNOSIS — M7061 Trochanteric bursitis, right hip: Secondary | ICD-10-CM | POA: Diagnosis not present

## 2018-07-29 DIAGNOSIS — E7800 Pure hypercholesterolemia, unspecified: Secondary | ICD-10-CM | POA: Diagnosis not present

## 2018-07-29 DIAGNOSIS — F329 Major depressive disorder, single episode, unspecified: Secondary | ICD-10-CM | POA: Diagnosis not present

## 2018-07-29 DIAGNOSIS — Z8673 Personal history of transient ischemic attack (TIA), and cerebral infarction without residual deficits: Secondary | ICD-10-CM | POA: Diagnosis not present

## 2018-08-03 DIAGNOSIS — F329 Major depressive disorder, single episode, unspecified: Secondary | ICD-10-CM | POA: Diagnosis not present

## 2018-08-03 DIAGNOSIS — I10 Essential (primary) hypertension: Secondary | ICD-10-CM | POA: Diagnosis not present

## 2018-08-03 DIAGNOSIS — M7061 Trochanteric bursitis, right hip: Secondary | ICD-10-CM | POA: Diagnosis not present

## 2018-08-03 DIAGNOSIS — M84451D Pathological fracture, right femur, subsequent encounter for fracture with routine healing: Secondary | ICD-10-CM | POA: Diagnosis not present

## 2018-08-03 DIAGNOSIS — Z8673 Personal history of transient ischemic attack (TIA), and cerebral infarction without residual deficits: Secondary | ICD-10-CM | POA: Diagnosis not present

## 2018-08-03 DIAGNOSIS — E7800 Pure hypercholesterolemia, unspecified: Secondary | ICD-10-CM | POA: Diagnosis not present

## 2018-08-05 DIAGNOSIS — E7800 Pure hypercholesterolemia, unspecified: Secondary | ICD-10-CM | POA: Diagnosis not present

## 2018-08-05 DIAGNOSIS — I10 Essential (primary) hypertension: Secondary | ICD-10-CM | POA: Diagnosis not present

## 2018-08-05 DIAGNOSIS — M84451D Pathological fracture, right femur, subsequent encounter for fracture with routine healing: Secondary | ICD-10-CM | POA: Diagnosis not present

## 2018-08-05 DIAGNOSIS — F329 Major depressive disorder, single episode, unspecified: Secondary | ICD-10-CM | POA: Diagnosis not present

## 2018-08-05 DIAGNOSIS — Z8673 Personal history of transient ischemic attack (TIA), and cerebral infarction without residual deficits: Secondary | ICD-10-CM | POA: Diagnosis not present

## 2018-08-05 DIAGNOSIS — M7061 Trochanteric bursitis, right hip: Secondary | ICD-10-CM | POA: Diagnosis not present

## 2018-08-10 DIAGNOSIS — I10 Essential (primary) hypertension: Secondary | ICD-10-CM | POA: Diagnosis not present

## 2018-08-10 DIAGNOSIS — F329 Major depressive disorder, single episode, unspecified: Secondary | ICD-10-CM | POA: Diagnosis not present

## 2018-08-10 DIAGNOSIS — M7061 Trochanteric bursitis, right hip: Secondary | ICD-10-CM | POA: Diagnosis not present

## 2018-08-10 DIAGNOSIS — M84451D Pathological fracture, right femur, subsequent encounter for fracture with routine healing: Secondary | ICD-10-CM | POA: Diagnosis not present

## 2018-08-10 DIAGNOSIS — E7800 Pure hypercholesterolemia, unspecified: Secondary | ICD-10-CM | POA: Diagnosis not present

## 2018-08-10 DIAGNOSIS — Z8673 Personal history of transient ischemic attack (TIA), and cerebral infarction without residual deficits: Secondary | ICD-10-CM | POA: Diagnosis not present

## 2018-08-12 DIAGNOSIS — M7061 Trochanteric bursitis, right hip: Secondary | ICD-10-CM | POA: Diagnosis not present

## 2018-08-12 DIAGNOSIS — Z8673 Personal history of transient ischemic attack (TIA), and cerebral infarction without residual deficits: Secondary | ICD-10-CM | POA: Diagnosis not present

## 2018-08-12 DIAGNOSIS — F329 Major depressive disorder, single episode, unspecified: Secondary | ICD-10-CM | POA: Diagnosis not present

## 2018-08-12 DIAGNOSIS — I10 Essential (primary) hypertension: Secondary | ICD-10-CM | POA: Diagnosis not present

## 2018-08-12 DIAGNOSIS — M84451D Pathological fracture, right femur, subsequent encounter for fracture with routine healing: Secondary | ICD-10-CM | POA: Diagnosis not present

## 2018-08-12 DIAGNOSIS — E7800 Pure hypercholesterolemia, unspecified: Secondary | ICD-10-CM | POA: Diagnosis not present

## 2018-08-25 DIAGNOSIS — Z859 Personal history of malignant neoplasm, unspecified: Secondary | ICD-10-CM | POA: Diagnosis not present

## 2018-08-25 DIAGNOSIS — I10 Essential (primary) hypertension: Secondary | ICD-10-CM | POA: Diagnosis not present

## 2018-08-25 DIAGNOSIS — E7800 Pure hypercholesterolemia, unspecified: Secondary | ICD-10-CM | POA: Diagnosis not present

## 2018-08-25 DIAGNOSIS — M84451D Pathological fracture, right femur, subsequent encounter for fracture with routine healing: Secondary | ICD-10-CM | POA: Diagnosis not present

## 2018-08-25 DIAGNOSIS — F329 Major depressive disorder, single episode, unspecified: Secondary | ICD-10-CM | POA: Diagnosis not present

## 2018-08-25 DIAGNOSIS — M7061 Trochanteric bursitis, right hip: Secondary | ICD-10-CM | POA: Diagnosis not present

## 2018-08-25 DIAGNOSIS — Z8673 Personal history of transient ischemic attack (TIA), and cerebral infarction without residual deficits: Secondary | ICD-10-CM | POA: Diagnosis not present

## 2018-08-25 DIAGNOSIS — Z87891 Personal history of nicotine dependence: Secondary | ICD-10-CM | POA: Diagnosis not present

## 2018-08-31 DIAGNOSIS — Z8673 Personal history of transient ischemic attack (TIA), and cerebral infarction without residual deficits: Secondary | ICD-10-CM | POA: Diagnosis not present

## 2018-08-31 DIAGNOSIS — F329 Major depressive disorder, single episode, unspecified: Secondary | ICD-10-CM | POA: Diagnosis not present

## 2018-08-31 DIAGNOSIS — M84451D Pathological fracture, right femur, subsequent encounter for fracture with routine healing: Secondary | ICD-10-CM | POA: Diagnosis not present

## 2018-08-31 DIAGNOSIS — E7800 Pure hypercholesterolemia, unspecified: Secondary | ICD-10-CM | POA: Diagnosis not present

## 2018-08-31 DIAGNOSIS — M7061 Trochanteric bursitis, right hip: Secondary | ICD-10-CM | POA: Diagnosis not present

## 2018-08-31 DIAGNOSIS — I10 Essential (primary) hypertension: Secondary | ICD-10-CM | POA: Diagnosis not present

## 2018-09-06 DIAGNOSIS — F329 Major depressive disorder, single episode, unspecified: Secondary | ICD-10-CM | POA: Diagnosis not present

## 2018-09-06 DIAGNOSIS — M84451D Pathological fracture, right femur, subsequent encounter for fracture with routine healing: Secondary | ICD-10-CM | POA: Diagnosis not present

## 2018-09-06 DIAGNOSIS — Z8673 Personal history of transient ischemic attack (TIA), and cerebral infarction without residual deficits: Secondary | ICD-10-CM | POA: Diagnosis not present

## 2018-09-06 DIAGNOSIS — M7061 Trochanteric bursitis, right hip: Secondary | ICD-10-CM | POA: Diagnosis not present

## 2018-09-06 DIAGNOSIS — E7800 Pure hypercholesterolemia, unspecified: Secondary | ICD-10-CM | POA: Diagnosis not present

## 2018-09-06 DIAGNOSIS — I10 Essential (primary) hypertension: Secondary | ICD-10-CM | POA: Diagnosis not present

## 2018-09-08 DIAGNOSIS — F329 Major depressive disorder, single episode, unspecified: Secondary | ICD-10-CM | POA: Diagnosis not present

## 2018-09-08 DIAGNOSIS — M84451D Pathological fracture, right femur, subsequent encounter for fracture with routine healing: Secondary | ICD-10-CM | POA: Diagnosis not present

## 2018-09-08 DIAGNOSIS — Z8673 Personal history of transient ischemic attack (TIA), and cerebral infarction without residual deficits: Secondary | ICD-10-CM | POA: Diagnosis not present

## 2018-09-08 DIAGNOSIS — E7800 Pure hypercholesterolemia, unspecified: Secondary | ICD-10-CM | POA: Diagnosis not present

## 2018-09-08 DIAGNOSIS — I10 Essential (primary) hypertension: Secondary | ICD-10-CM | POA: Diagnosis not present

## 2018-09-08 DIAGNOSIS — M7061 Trochanteric bursitis, right hip: Secondary | ICD-10-CM | POA: Diagnosis not present

## 2018-09-13 DIAGNOSIS — M84451D Pathological fracture, right femur, subsequent encounter for fracture with routine healing: Secondary | ICD-10-CM | POA: Diagnosis not present

## 2018-09-13 DIAGNOSIS — F329 Major depressive disorder, single episode, unspecified: Secondary | ICD-10-CM | POA: Diagnosis not present

## 2018-09-13 DIAGNOSIS — E7800 Pure hypercholesterolemia, unspecified: Secondary | ICD-10-CM | POA: Diagnosis not present

## 2018-09-13 DIAGNOSIS — I10 Essential (primary) hypertension: Secondary | ICD-10-CM | POA: Diagnosis not present

## 2018-09-13 DIAGNOSIS — M7061 Trochanteric bursitis, right hip: Secondary | ICD-10-CM | POA: Diagnosis not present

## 2018-09-13 DIAGNOSIS — Z8673 Personal history of transient ischemic attack (TIA), and cerebral infarction without residual deficits: Secondary | ICD-10-CM | POA: Diagnosis not present

## 2018-09-22 DIAGNOSIS — Z8673 Personal history of transient ischemic attack (TIA), and cerebral infarction without residual deficits: Secondary | ICD-10-CM | POA: Diagnosis not present

## 2018-09-22 DIAGNOSIS — M84451D Pathological fracture, right femur, subsequent encounter for fracture with routine healing: Secondary | ICD-10-CM | POA: Diagnosis not present

## 2018-09-22 DIAGNOSIS — M7061 Trochanteric bursitis, right hip: Secondary | ICD-10-CM | POA: Diagnosis not present

## 2018-09-22 DIAGNOSIS — I10 Essential (primary) hypertension: Secondary | ICD-10-CM | POA: Diagnosis not present

## 2018-09-22 DIAGNOSIS — F329 Major depressive disorder, single episode, unspecified: Secondary | ICD-10-CM | POA: Diagnosis not present

## 2018-09-22 DIAGNOSIS — E7800 Pure hypercholesterolemia, unspecified: Secondary | ICD-10-CM | POA: Diagnosis not present

## 2018-09-24 DIAGNOSIS — Z8673 Personal history of transient ischemic attack (TIA), and cerebral infarction without residual deficits: Secondary | ICD-10-CM | POA: Diagnosis not present

## 2018-09-24 DIAGNOSIS — Z859 Personal history of malignant neoplasm, unspecified: Secondary | ICD-10-CM | POA: Diagnosis not present

## 2018-09-24 DIAGNOSIS — M7061 Trochanteric bursitis, right hip: Secondary | ICD-10-CM | POA: Diagnosis not present

## 2018-09-24 DIAGNOSIS — Z9181 History of falling: Secondary | ICD-10-CM | POA: Diagnosis not present

## 2018-09-24 DIAGNOSIS — I10 Essential (primary) hypertension: Secondary | ICD-10-CM | POA: Diagnosis not present

## 2018-09-24 DIAGNOSIS — F329 Major depressive disorder, single episode, unspecified: Secondary | ICD-10-CM | POA: Diagnosis not present

## 2018-09-24 DIAGNOSIS — Z7951 Long term (current) use of inhaled steroids: Secondary | ICD-10-CM | POA: Diagnosis not present

## 2018-09-24 DIAGNOSIS — M84451D Pathological fracture, right femur, subsequent encounter for fracture with routine healing: Secondary | ICD-10-CM | POA: Diagnosis not present

## 2018-09-24 DIAGNOSIS — E7800 Pure hypercholesterolemia, unspecified: Secondary | ICD-10-CM | POA: Diagnosis not present

## 2018-09-24 DIAGNOSIS — Z87891 Personal history of nicotine dependence: Secondary | ICD-10-CM | POA: Diagnosis not present

## 2018-09-29 DIAGNOSIS — I10 Essential (primary) hypertension: Secondary | ICD-10-CM | POA: Diagnosis not present

## 2018-09-29 DIAGNOSIS — F329 Major depressive disorder, single episode, unspecified: Secondary | ICD-10-CM | POA: Diagnosis not present

## 2018-09-29 DIAGNOSIS — Z7951 Long term (current) use of inhaled steroids: Secondary | ICD-10-CM | POA: Diagnosis not present

## 2018-09-29 DIAGNOSIS — M7061 Trochanteric bursitis, right hip: Secondary | ICD-10-CM | POA: Diagnosis not present

## 2018-09-29 DIAGNOSIS — E7800 Pure hypercholesterolemia, unspecified: Secondary | ICD-10-CM | POA: Diagnosis not present

## 2018-09-29 DIAGNOSIS — M84451D Pathological fracture, right femur, subsequent encounter for fracture with routine healing: Secondary | ICD-10-CM | POA: Diagnosis not present

## 2018-10-01 DIAGNOSIS — M84451D Pathological fracture, right femur, subsequent encounter for fracture with routine healing: Secondary | ICD-10-CM | POA: Diagnosis not present

## 2018-10-01 DIAGNOSIS — I10 Essential (primary) hypertension: Secondary | ICD-10-CM | POA: Diagnosis not present

## 2018-10-01 DIAGNOSIS — E7800 Pure hypercholesterolemia, unspecified: Secondary | ICD-10-CM | POA: Diagnosis not present

## 2018-10-01 DIAGNOSIS — Z7951 Long term (current) use of inhaled steroids: Secondary | ICD-10-CM | POA: Diagnosis not present

## 2018-10-01 DIAGNOSIS — F329 Major depressive disorder, single episode, unspecified: Secondary | ICD-10-CM | POA: Diagnosis not present

## 2018-10-01 DIAGNOSIS — M7061 Trochanteric bursitis, right hip: Secondary | ICD-10-CM | POA: Diagnosis not present

## 2018-10-04 DIAGNOSIS — I10 Essential (primary) hypertension: Secondary | ICD-10-CM | POA: Diagnosis not present

## 2018-10-04 DIAGNOSIS — F329 Major depressive disorder, single episode, unspecified: Secondary | ICD-10-CM | POA: Diagnosis not present

## 2018-10-04 DIAGNOSIS — E7800 Pure hypercholesterolemia, unspecified: Secondary | ICD-10-CM | POA: Diagnosis not present

## 2018-10-04 DIAGNOSIS — Z7951 Long term (current) use of inhaled steroids: Secondary | ICD-10-CM | POA: Diagnosis not present

## 2018-10-04 DIAGNOSIS — M7061 Trochanteric bursitis, right hip: Secondary | ICD-10-CM | POA: Diagnosis not present

## 2018-10-04 DIAGNOSIS — M84451D Pathological fracture, right femur, subsequent encounter for fracture with routine healing: Secondary | ICD-10-CM | POA: Diagnosis not present

## 2018-10-06 DIAGNOSIS — E7800 Pure hypercholesterolemia, unspecified: Secondary | ICD-10-CM | POA: Diagnosis not present

## 2018-10-06 DIAGNOSIS — I10 Essential (primary) hypertension: Secondary | ICD-10-CM | POA: Diagnosis not present

## 2018-10-06 DIAGNOSIS — M7061 Trochanteric bursitis, right hip: Secondary | ICD-10-CM | POA: Diagnosis not present

## 2018-10-06 DIAGNOSIS — Z7951 Long term (current) use of inhaled steroids: Secondary | ICD-10-CM | POA: Diagnosis not present

## 2018-10-06 DIAGNOSIS — M84451D Pathological fracture, right femur, subsequent encounter for fracture with routine healing: Secondary | ICD-10-CM | POA: Diagnosis not present

## 2018-10-06 DIAGNOSIS — F329 Major depressive disorder, single episode, unspecified: Secondary | ICD-10-CM | POA: Diagnosis not present

## 2018-10-11 DIAGNOSIS — Z7951 Long term (current) use of inhaled steroids: Secondary | ICD-10-CM | POA: Diagnosis not present

## 2018-10-11 DIAGNOSIS — E7800 Pure hypercholesterolemia, unspecified: Secondary | ICD-10-CM | POA: Diagnosis not present

## 2018-10-11 DIAGNOSIS — M84451D Pathological fracture, right femur, subsequent encounter for fracture with routine healing: Secondary | ICD-10-CM | POA: Diagnosis not present

## 2018-10-11 DIAGNOSIS — I10 Essential (primary) hypertension: Secondary | ICD-10-CM | POA: Diagnosis not present

## 2018-10-11 DIAGNOSIS — F329 Major depressive disorder, single episode, unspecified: Secondary | ICD-10-CM | POA: Diagnosis not present

## 2018-10-11 DIAGNOSIS — M7061 Trochanteric bursitis, right hip: Secondary | ICD-10-CM | POA: Diagnosis not present

## 2018-10-14 DIAGNOSIS — E7800 Pure hypercholesterolemia, unspecified: Secondary | ICD-10-CM | POA: Diagnosis not present

## 2018-10-14 DIAGNOSIS — Z7951 Long term (current) use of inhaled steroids: Secondary | ICD-10-CM | POA: Diagnosis not present

## 2018-10-14 DIAGNOSIS — F329 Major depressive disorder, single episode, unspecified: Secondary | ICD-10-CM | POA: Diagnosis not present

## 2018-10-14 DIAGNOSIS — I10 Essential (primary) hypertension: Secondary | ICD-10-CM | POA: Diagnosis not present

## 2018-10-14 DIAGNOSIS — M84451D Pathological fracture, right femur, subsequent encounter for fracture with routine healing: Secondary | ICD-10-CM | POA: Diagnosis not present

## 2018-10-14 DIAGNOSIS — M7061 Trochanteric bursitis, right hip: Secondary | ICD-10-CM | POA: Diagnosis not present

## 2018-10-18 DIAGNOSIS — M84451D Pathological fracture, right femur, subsequent encounter for fracture with routine healing: Secondary | ICD-10-CM | POA: Diagnosis not present

## 2018-10-18 DIAGNOSIS — Z7951 Long term (current) use of inhaled steroids: Secondary | ICD-10-CM | POA: Diagnosis not present

## 2018-10-18 DIAGNOSIS — E7800 Pure hypercholesterolemia, unspecified: Secondary | ICD-10-CM | POA: Diagnosis not present

## 2018-10-18 DIAGNOSIS — I10 Essential (primary) hypertension: Secondary | ICD-10-CM | POA: Diagnosis not present

## 2018-10-18 DIAGNOSIS — M7061 Trochanteric bursitis, right hip: Secondary | ICD-10-CM | POA: Diagnosis not present

## 2018-10-18 DIAGNOSIS — F329 Major depressive disorder, single episode, unspecified: Secondary | ICD-10-CM | POA: Diagnosis not present

## 2018-10-20 DIAGNOSIS — M7061 Trochanteric bursitis, right hip: Secondary | ICD-10-CM | POA: Diagnosis not present

## 2018-10-20 DIAGNOSIS — E7800 Pure hypercholesterolemia, unspecified: Secondary | ICD-10-CM | POA: Diagnosis not present

## 2018-10-20 DIAGNOSIS — I10 Essential (primary) hypertension: Secondary | ICD-10-CM | POA: Diagnosis not present

## 2018-10-20 DIAGNOSIS — F329 Major depressive disorder, single episode, unspecified: Secondary | ICD-10-CM | POA: Diagnosis not present

## 2018-10-20 DIAGNOSIS — Z7951 Long term (current) use of inhaled steroids: Secondary | ICD-10-CM | POA: Diagnosis not present

## 2018-10-20 DIAGNOSIS — M84451D Pathological fracture, right femur, subsequent encounter for fracture with routine healing: Secondary | ICD-10-CM | POA: Diagnosis not present

## 2018-10-24 DIAGNOSIS — Z87891 Personal history of nicotine dependence: Secondary | ICD-10-CM | POA: Diagnosis not present

## 2018-10-24 DIAGNOSIS — E7800 Pure hypercholesterolemia, unspecified: Secondary | ICD-10-CM | POA: Diagnosis not present

## 2018-10-24 DIAGNOSIS — Z7951 Long term (current) use of inhaled steroids: Secondary | ICD-10-CM | POA: Diagnosis not present

## 2018-10-24 DIAGNOSIS — F329 Major depressive disorder, single episode, unspecified: Secondary | ICD-10-CM | POA: Diagnosis not present

## 2018-10-24 DIAGNOSIS — M7061 Trochanteric bursitis, right hip: Secondary | ICD-10-CM | POA: Diagnosis not present

## 2018-10-24 DIAGNOSIS — Z859 Personal history of malignant neoplasm, unspecified: Secondary | ICD-10-CM | POA: Diagnosis not present

## 2018-10-24 DIAGNOSIS — Z9181 History of falling: Secondary | ICD-10-CM | POA: Diagnosis not present

## 2018-10-24 DIAGNOSIS — M84451D Pathological fracture, right femur, subsequent encounter for fracture with routine healing: Secondary | ICD-10-CM | POA: Diagnosis not present

## 2018-10-24 DIAGNOSIS — I10 Essential (primary) hypertension: Secondary | ICD-10-CM | POA: Diagnosis not present

## 2018-10-24 DIAGNOSIS — Z8673 Personal history of transient ischemic attack (TIA), and cerebral infarction without residual deficits: Secondary | ICD-10-CM | POA: Diagnosis not present

## 2018-10-26 DIAGNOSIS — M7061 Trochanteric bursitis, right hip: Secondary | ICD-10-CM | POA: Diagnosis not present

## 2018-10-26 DIAGNOSIS — M84451D Pathological fracture, right femur, subsequent encounter for fracture with routine healing: Secondary | ICD-10-CM | POA: Diagnosis not present

## 2018-10-26 DIAGNOSIS — F329 Major depressive disorder, single episode, unspecified: Secondary | ICD-10-CM | POA: Diagnosis not present

## 2018-10-26 DIAGNOSIS — I10 Essential (primary) hypertension: Secondary | ICD-10-CM | POA: Diagnosis not present

## 2018-10-26 DIAGNOSIS — E7800 Pure hypercholesterolemia, unspecified: Secondary | ICD-10-CM | POA: Diagnosis not present

## 2018-10-26 DIAGNOSIS — Z7951 Long term (current) use of inhaled steroids: Secondary | ICD-10-CM | POA: Diagnosis not present

## 2018-11-02 DIAGNOSIS — M7061 Trochanteric bursitis, right hip: Secondary | ICD-10-CM | POA: Diagnosis not present

## 2018-11-02 DIAGNOSIS — I10 Essential (primary) hypertension: Secondary | ICD-10-CM | POA: Diagnosis not present

## 2018-11-02 DIAGNOSIS — E7800 Pure hypercholesterolemia, unspecified: Secondary | ICD-10-CM | POA: Diagnosis not present

## 2018-11-02 DIAGNOSIS — Z7951 Long term (current) use of inhaled steroids: Secondary | ICD-10-CM | POA: Diagnosis not present

## 2018-11-02 DIAGNOSIS — M84451D Pathological fracture, right femur, subsequent encounter for fracture with routine healing: Secondary | ICD-10-CM | POA: Diagnosis not present

## 2018-11-02 DIAGNOSIS — F329 Major depressive disorder, single episode, unspecified: Secondary | ICD-10-CM | POA: Diagnosis not present

## 2018-11-09 DIAGNOSIS — M7061 Trochanteric bursitis, right hip: Secondary | ICD-10-CM | POA: Diagnosis not present

## 2018-11-09 DIAGNOSIS — M84451D Pathological fracture, right femur, subsequent encounter for fracture with routine healing: Secondary | ICD-10-CM | POA: Diagnosis not present

## 2018-11-09 DIAGNOSIS — F329 Major depressive disorder, single episode, unspecified: Secondary | ICD-10-CM | POA: Diagnosis not present

## 2018-11-09 DIAGNOSIS — I10 Essential (primary) hypertension: Secondary | ICD-10-CM | POA: Diagnosis not present

## 2018-11-09 DIAGNOSIS — Z7951 Long term (current) use of inhaled steroids: Secondary | ICD-10-CM | POA: Diagnosis not present

## 2018-11-09 DIAGNOSIS — E7800 Pure hypercholesterolemia, unspecified: Secondary | ICD-10-CM | POA: Diagnosis not present

## 2018-11-17 DIAGNOSIS — F329 Major depressive disorder, single episode, unspecified: Secondary | ICD-10-CM | POA: Diagnosis not present

## 2018-11-17 DIAGNOSIS — M7061 Trochanteric bursitis, right hip: Secondary | ICD-10-CM | POA: Diagnosis not present

## 2018-11-17 DIAGNOSIS — M84451D Pathological fracture, right femur, subsequent encounter for fracture with routine healing: Secondary | ICD-10-CM | POA: Diagnosis not present

## 2018-11-17 DIAGNOSIS — E7800 Pure hypercholesterolemia, unspecified: Secondary | ICD-10-CM | POA: Diagnosis not present

## 2018-11-17 DIAGNOSIS — I10 Essential (primary) hypertension: Secondary | ICD-10-CM | POA: Diagnosis not present

## 2018-11-17 DIAGNOSIS — Z7951 Long term (current) use of inhaled steroids: Secondary | ICD-10-CM | POA: Diagnosis not present

## 2019-03-07 DIAGNOSIS — R600 Localized edema: Secondary | ICD-10-CM | POA: Diagnosis not present

## 2019-03-07 DIAGNOSIS — J3489 Other specified disorders of nose and nasal sinuses: Secondary | ICD-10-CM | POA: Diagnosis not present

## 2019-03-07 DIAGNOSIS — R0982 Postnasal drip: Secondary | ICD-10-CM | POA: Diagnosis not present

## 2019-03-11 DIAGNOSIS — Z20822 Contact with and (suspected) exposure to covid-19: Secondary | ICD-10-CM | POA: Diagnosis not present

## 2019-03-11 DIAGNOSIS — R0981 Nasal congestion: Secondary | ICD-10-CM | POA: Diagnosis not present

## 2019-09-29 DIAGNOSIS — Z23 Encounter for immunization: Secondary | ICD-10-CM | POA: Diagnosis not present

## 2019-10-06 ENCOUNTER — Emergency Department (HOSPITAL_COMMUNITY): Payer: Medicare Other

## 2019-10-06 ENCOUNTER — Inpatient Hospital Stay (HOSPITAL_COMMUNITY)
Admission: EM | Admit: 2019-10-06 | Discharge: 2019-10-18 | DRG: 190 | Disposition: A | Discharge status: Home Health | Payer: Medicare Other | Attending: Internal Medicine | Admitting: Internal Medicine

## 2019-10-06 ENCOUNTER — Encounter (HOSPITAL_COMMUNITY): Payer: Self-pay

## 2019-10-06 ENCOUNTER — Other Ambulatory Visit: Payer: Self-pay

## 2019-10-06 DIAGNOSIS — E785 Hyperlipidemia, unspecified: Secondary | ICD-10-CM | POA: Diagnosis present

## 2019-10-06 DIAGNOSIS — J440 Chronic obstructive pulmonary disease with acute lower respiratory infection: Secondary | ICD-10-CM | POA: Diagnosis present

## 2019-10-06 DIAGNOSIS — I5033 Acute on chronic diastolic (congestive) heart failure: Secondary | ICD-10-CM | POA: Diagnosis present

## 2019-10-06 DIAGNOSIS — Z7189 Other specified counseling: Secondary | ICD-10-CM | POA: Diagnosis not present

## 2019-10-06 DIAGNOSIS — R531 Weakness: Secondary | ICD-10-CM

## 2019-10-06 DIAGNOSIS — I69351 Hemiplegia and hemiparesis following cerebral infarction affecting right dominant side: Secondary | ICD-10-CM

## 2019-10-06 DIAGNOSIS — T17990A Other foreign object in respiratory tract, part unspecified in causing asphyxiation, initial encounter: Secondary | ICD-10-CM | POA: Diagnosis not present

## 2019-10-06 DIAGNOSIS — E876 Hypokalemia: Secondary | ICD-10-CM | POA: Diagnosis present

## 2019-10-06 DIAGNOSIS — I639 Cerebral infarction, unspecified: Secondary | ICD-10-CM | POA: Diagnosis present

## 2019-10-06 DIAGNOSIS — F411 Generalized anxiety disorder: Secondary | ICD-10-CM | POA: Diagnosis present

## 2019-10-06 DIAGNOSIS — Y92239 Unspecified place in hospital as the place of occurrence of the external cause: Secondary | ICD-10-CM | POA: Diagnosis not present

## 2019-10-06 DIAGNOSIS — I82511 Chronic embolism and thrombosis of right femoral vein: Secondary | ICD-10-CM | POA: Diagnosis present

## 2019-10-06 DIAGNOSIS — J9811 Atelectasis: Secondary | ICD-10-CM | POA: Diagnosis not present

## 2019-10-06 DIAGNOSIS — R0602 Shortness of breath: Secondary | ICD-10-CM | POA: Diagnosis not present

## 2019-10-06 DIAGNOSIS — J9690 Respiratory failure, unspecified, unspecified whether with hypoxia or hypercapnia: Secondary | ICD-10-CM | POA: Diagnosis not present

## 2019-10-06 DIAGNOSIS — Z9119 Patient's noncompliance with other medical treatment and regimen: Secondary | ICD-10-CM | POA: Diagnosis not present

## 2019-10-06 DIAGNOSIS — R0902 Hypoxemia: Secondary | ICD-10-CM | POA: Diagnosis not present

## 2019-10-06 DIAGNOSIS — J80 Acute respiratory distress syndrome: Secondary | ICD-10-CM | POA: Diagnosis not present

## 2019-10-06 DIAGNOSIS — I82401 Acute embolism and thrombosis of unspecified deep veins of right lower extremity: Secondary | ICD-10-CM | POA: Diagnosis present

## 2019-10-06 DIAGNOSIS — X58XXXA Exposure to other specified factors, initial encounter: Secondary | ICD-10-CM | POA: Diagnosis not present

## 2019-10-06 DIAGNOSIS — F410 Panic disorder [episodic paroxysmal anxiety] without agoraphobia: Secondary | ICD-10-CM | POA: Diagnosis present

## 2019-10-06 DIAGNOSIS — J9602 Acute respiratory failure with hypercapnia: Secondary | ICD-10-CM

## 2019-10-06 DIAGNOSIS — B372 Candidiasis of skin and nail: Secondary | ICD-10-CM | POA: Diagnosis present

## 2019-10-06 DIAGNOSIS — J441 Chronic obstructive pulmonary disease with (acute) exacerbation: Secondary | ICD-10-CM | POA: Diagnosis not present

## 2019-10-06 DIAGNOSIS — Z515 Encounter for palliative care: Secondary | ICD-10-CM

## 2019-10-06 DIAGNOSIS — Z79899 Other long term (current) drug therapy: Secondary | ICD-10-CM | POA: Diagnosis not present

## 2019-10-06 DIAGNOSIS — J9622 Acute and chronic respiratory failure with hypercapnia: Secondary | ICD-10-CM | POA: Diagnosis present

## 2019-10-06 DIAGNOSIS — I82531 Chronic embolism and thrombosis of right popliteal vein: Secondary | ICD-10-CM | POA: Diagnosis present

## 2019-10-06 DIAGNOSIS — I2699 Other pulmonary embolism without acute cor pulmonale: Secondary | ICD-10-CM | POA: Diagnosis present

## 2019-10-06 DIAGNOSIS — I5189 Other ill-defined heart diseases: Secondary | ICD-10-CM

## 2019-10-06 DIAGNOSIS — Z91199 Patient's noncompliance with other medical treatment and regimen due to unspecified reason: Secondary | ICD-10-CM

## 2019-10-06 DIAGNOSIS — Z9002 Acquired absence of larynx: Secondary | ICD-10-CM

## 2019-10-06 DIAGNOSIS — R001 Bradycardia, unspecified: Secondary | ICD-10-CM | POA: Diagnosis not present

## 2019-10-06 DIAGNOSIS — Z90710 Acquired absence of both cervix and uterus: Secondary | ICD-10-CM | POA: Diagnosis not present

## 2019-10-06 DIAGNOSIS — E7800 Pure hypercholesterolemia, unspecified: Secondary | ICD-10-CM | POA: Diagnosis present

## 2019-10-06 DIAGNOSIS — J9621 Acute and chronic respiratory failure with hypoxia: Secondary | ICD-10-CM | POA: Diagnosis present

## 2019-10-06 DIAGNOSIS — I110 Hypertensive heart disease with heart failure: Secondary | ICD-10-CM | POA: Diagnosis present

## 2019-10-06 DIAGNOSIS — I517 Cardiomegaly: Secondary | ICD-10-CM | POA: Diagnosis not present

## 2019-10-06 DIAGNOSIS — I10 Essential (primary) hypertension: Secondary | ICD-10-CM | POA: Diagnosis present

## 2019-10-06 DIAGNOSIS — R05 Cough: Secondary | ICD-10-CM | POA: Diagnosis not present

## 2019-10-06 DIAGNOSIS — K449 Diaphragmatic hernia without obstruction or gangrene: Secondary | ICD-10-CM | POA: Diagnosis present

## 2019-10-06 DIAGNOSIS — J449 Chronic obstructive pulmonary disease, unspecified: Secondary | ICD-10-CM | POA: Diagnosis not present

## 2019-10-06 DIAGNOSIS — N179 Acute kidney failure, unspecified: Secondary | ICD-10-CM | POA: Diagnosis not present

## 2019-10-06 DIAGNOSIS — I5031 Acute diastolic (congestive) heart failure: Secondary | ICD-10-CM | POA: Diagnosis not present

## 2019-10-06 DIAGNOSIS — R509 Fever, unspecified: Secondary | ICD-10-CM

## 2019-10-06 DIAGNOSIS — Z20822 Contact with and (suspected) exposure to covid-19: Secondary | ICD-10-CM | POA: Diagnosis present

## 2019-10-06 DIAGNOSIS — Z87891 Personal history of nicotine dependence: Secondary | ICD-10-CM | POA: Diagnosis not present

## 2019-10-06 DIAGNOSIS — J811 Chronic pulmonary edema: Secondary | ICD-10-CM | POA: Diagnosis not present

## 2019-10-06 DIAGNOSIS — Z8521 Personal history of malignant neoplasm of larynx: Secondary | ICD-10-CM

## 2019-10-06 DIAGNOSIS — R0689 Other abnormalities of breathing: Secondary | ICD-10-CM | POA: Diagnosis not present

## 2019-10-06 DIAGNOSIS — R404 Transient alteration of awareness: Secondary | ICD-10-CM | POA: Diagnosis not present

## 2019-10-06 DIAGNOSIS — T501X5A Adverse effect of loop [high-ceiling] diuretics, initial encounter: Secondary | ICD-10-CM | POA: Diagnosis not present

## 2019-10-06 DIAGNOSIS — J90 Pleural effusion, not elsewhere classified: Secondary | ICD-10-CM | POA: Diagnosis not present

## 2019-10-06 DIAGNOSIS — J9601 Acute respiratory failure with hypoxia: Secondary | ICD-10-CM | POA: Diagnosis present

## 2019-10-06 DIAGNOSIS — Z9114 Patient's other noncompliance with medication regimen: Secondary | ICD-10-CM

## 2019-10-06 LAB — CBC WITH DIFFERENTIAL/PLATELET
Abs Immature Granulocytes: 0.02 10*3/uL (ref 0.00–0.07)
Basophils Absolute: 0 10*3/uL (ref 0.0–0.1)
Basophils Relative: 0 %
Eosinophils Absolute: 0 10*3/uL (ref 0.0–0.5)
Eosinophils Relative: 0 %
HCT: 33.8 % — ABNORMAL LOW (ref 36.0–46.0)
Hemoglobin: 10.5 g/dL — ABNORMAL LOW (ref 12.0–15.0)
Immature Granulocytes: 0 %
Lymphocytes Relative: 15 %
Lymphs Abs: 1.1 10*3/uL (ref 0.7–4.0)
MCH: 29.1 pg (ref 26.0–34.0)
MCHC: 31.1 g/dL (ref 30.0–36.0)
MCV: 93.6 fL (ref 80.0–100.0)
Monocytes Absolute: 0.6 10*3/uL (ref 0.1–1.0)
Monocytes Relative: 8 %
Neutro Abs: 5.9 10*3/uL (ref 1.7–7.7)
Neutrophils Relative %: 77 %
Platelets: 137 10*3/uL — ABNORMAL LOW (ref 150–400)
RBC: 3.61 MIL/uL — ABNORMAL LOW (ref 3.87–5.11)
RDW: 12.2 % (ref 11.5–15.5)
WBC: 7.7 10*3/uL (ref 4.0–10.5)
nRBC: 0 % (ref 0.0–0.2)

## 2019-10-06 LAB — COMPREHENSIVE METABOLIC PANEL
ALT: 14 U/L (ref 0–44)
AST: 21 U/L (ref 15–41)
Albumin: 3.6 g/dL (ref 3.5–5.0)
Alkaline Phosphatase: 91 U/L (ref 38–126)
Anion gap: 8 (ref 5–15)
BUN: 18 mg/dL (ref 8–23)
CO2: 25 mmol/L (ref 22–32)
Calcium: 8.4 mg/dL — ABNORMAL LOW (ref 8.9–10.3)
Chloride: 108 mmol/L (ref 98–111)
Creatinine, Ser: 0.84 mg/dL (ref 0.44–1.00)
GFR calc Af Amer: >60 mL/min (ref >60)
GFR calc non Af Amer: >60 mL/min (ref >60)
Glucose, Bld: 89 mg/dL (ref 70–99)
Potassium: 3.4 mmol/L — ABNORMAL LOW (ref 3.5–5.1)
Sodium: 141 mmol/L (ref 135–145)
Total Bilirubin: 0.7 mg/dL (ref 0.3–1.2)
Total Protein: 6.9 g/dL (ref 6.5–8.1)

## 2019-10-06 LAB — FERRITIN: Ferritin: 99 ng/mL (ref 11–307)

## 2019-10-06 LAB — BLOOD GAS, ARTERIAL
Acid-base deficit: 5.8 mmol/L — ABNORMAL HIGH (ref 0.0–2.0)
Bicarbonate: 22.8 mmol/L (ref 20.0–28.0)
Drawn by: 270211
O2 Saturation: 93.2 %
Patient temperature: 98.6
pCO2 arterial: 59.8 mmHg — ABNORMAL HIGH (ref 32.0–48.0)
pH, Arterial: 7.205 — ABNORMAL LOW (ref 7.350–7.450)
pO2, Arterial: 88.6 mmHg (ref 83.0–108.0)

## 2019-10-06 LAB — BLOOD GAS, VENOUS
Acid-Base Excess: 1.1 mmol/L (ref 0.0–2.0)
Bicarbonate: 26.8 mmol/L (ref 20.0–28.0)
O2 Saturation: 56.5 %
Patient temperature: 98.6
pCO2, Ven: 49.7 mmHg (ref 44.0–60.0)
pH, Ven: 7.351 (ref 7.250–7.430)
pO2, Ven: 36.4 mmHg (ref 32.0–45.0)

## 2019-10-06 LAB — C-REACTIVE PROTEIN: CRP: 4.4 mg/dL — ABNORMAL HIGH (ref <1.0)

## 2019-10-06 LAB — FIBRINOGEN: Fibrinogen: 513 mg/dL — ABNORMAL HIGH (ref 210–475)

## 2019-10-06 LAB — D-DIMER, QUANTITATIVE: D-Dimer, Quant: 5.32 ug/mL-FEU — ABNORMAL HIGH (ref 0.00–0.50)

## 2019-10-06 LAB — SARS CORONAVIRUS 2 BY RT PCR (HOSPITAL ORDER, PERFORMED IN ~~LOC~~ HOSPITAL LAB): SARS Coronavirus 2: NEGATIVE

## 2019-10-06 LAB — PROCALCITONIN: Procalcitonin: <0.1 ng/mL

## 2019-10-06 MED ORDER — AZITHROMYCIN 250 MG PO TABS
500.0000 mg | ORAL_TABLET | Freq: Every day | ORAL | Status: AC
  Administered 2019-10-06 – 2019-10-08 (×3): 500 mg via ORAL
  Filled 2019-10-06 (×3): qty 2

## 2019-10-06 MED ORDER — IPRATROPIUM-ALBUTEROL 0.5-2.5 (3) MG/3ML IN SOLN
3.0000 mL | Freq: Four times a day (QID) | RESPIRATORY_TRACT | Status: DC
  Administered 2019-10-06: 3 mL via RESPIRATORY_TRACT
  Filled 2019-10-06: qty 3

## 2019-10-06 MED ORDER — LABETALOL HCL 5 MG/ML IV SOLN: 5.0000 mg | INTRAVENOUS | Status: DC | PRN

## 2019-10-06 MED ORDER — DM-GUAIFENESIN ER 30-600 MG PO TB12: 1.0000 | ORAL_TABLET | Freq: Two times a day (BID) | ORAL | Status: DC

## 2019-10-06 MED ORDER — POTASSIUM CHLORIDE CRYS ER 20 MEQ PO TBCR
40.0000 meq | EXTENDED_RELEASE_TABLET | Freq: Once | ORAL | Status: AC
  Administered 2019-10-06: 40 meq via ORAL
  Filled 2019-10-06: qty 2

## 2019-10-06 MED ORDER — LORAZEPAM 2 MG/ML IJ SOLN
0.5000 mg | Freq: Once | INTRAMUSCULAR | Status: AC
  Administered 2019-10-06: 0.5 mg via INTRAVENOUS
  Filled 2019-10-06: qty 1

## 2019-10-06 MED ORDER — ENOXAPARIN SODIUM 40 MG/0.4ML ~~LOC~~ SOLN
40.0000 mg | Freq: Every day | SUBCUTANEOUS | Status: DC
  Administered 2019-10-07 (×2): 40 mg via SUBCUTANEOUS
  Filled 2019-10-06 (×2): qty 0.4

## 2019-10-06 MED ORDER — AMLODIPINE BESYLATE 10 MG PO TABS
10.0000 mg | ORAL_TABLET | Freq: Every day | ORAL | Status: DC
  Administered 2019-10-06 – 2019-10-11 (×5): 10 mg via ORAL
  Filled 2019-10-06 (×3): qty 1
  Filled 2019-10-06 (×2): qty 2
  Filled 2019-10-06: qty 1

## 2019-10-06 MED ORDER — NYSTATIN 100000 UNIT/GM EX CREA
TOPICAL_CREAM | Freq: Three times a day (TID) | CUTANEOUS | Status: DC
  Administered 2019-10-09 – 2019-10-16 (×3): 1 via TOPICAL
  Filled 2019-10-06 (×3): qty 15

## 2019-10-06 MED ORDER — SODIUM CHLORIDE (PF) 0.9 % IJ SOLN
INTRAMUSCULAR | Status: AC
  Filled 2019-10-06: qty 50

## 2019-10-06 MED ORDER — IOHEXOL 350 MG/ML SOLN
100.0000 mL | Freq: Once | INTRAVENOUS | Status: AC | PRN
  Administered 2019-10-06: 75 mL via INTRAVENOUS

## 2019-10-06 MED ORDER — METHYLPREDNISOLONE SODIUM SUCC 125 MG IJ SOLR
60.0000 mg | Freq: Two times a day (BID) | INTRAMUSCULAR | Status: DC
  Administered 2019-10-07 – 2019-10-09 (×5): 60 mg via INTRAVENOUS
  Filled 2019-10-06 (×5): qty 2

## 2019-10-06 MED ORDER — METHYLPREDNISOLONE SODIUM SUCC 125 MG IJ SOLR
125.0000 mg | Freq: Once | INTRAMUSCULAR | Status: AC
  Administered 2019-10-06: 125 mg via INTRAVENOUS
  Filled 2019-10-06: qty 2

## 2019-10-06 MED ORDER — ALBUTEROL (5 MG/ML) CONTINUOUS INHALATION SOLN
10.0000 mg/h | INHALATION_SOLUTION | Freq: Once | RESPIRATORY_TRACT | Status: AC
  Administered 2019-10-06: 10 mg/h via RESPIRATORY_TRACT
  Filled 2019-10-06: qty 20

## 2019-10-06 NOTE — ED Provider Notes (Signed)
[facility] DEPT Provider Note   CSN: [id number] Arrival date & time: 10/06/19  0805     History Chief Complaint  Patient presents with  . Shortness of Breath  . Cough    Katelyn [name] is a 69 y.o. female.  HPI Patient presenting by EMS for evaluation of cough with trouble breathing, noticed by family members, 2 days ago and worsening today.  She is requiring more help today than usual.  She lives with her "boyfriend."  Has been communicating with family members about her health.  She had her first of 2 Pfizer Covid vaccines, about 10 days ago.  There is no reported fever.  She has not been vomiting.  She has a tracheostomy which has not had a trach appliance in it for a couple of years.  She "does not speak."  Her daughter at the bedside and one on the telephone gave me history.  There are no other reported problems, currently.  There are no other known modifying factors.    Past Medical History:  Diagnosis Date  . Acute gallstone pancreatitis 01/08/2014  . AKI (acute kidney injury) ([place]) 01/08/2014  . Anemia   . Blood transfusion    11'15 last admission  . Cancer ([place])   . Cholecystitis   . H/O tracheostomy   . Hip fracture ([place])   . Hypercholesteremia   . Hypertension   . Right leg DVT ([place]) 05/08/2014  . Septic shock - Kelbsiella 01/09/2014  . Shortness of breath   . Stroke [facility])    '13- right side weakness, ambulates with cane    Patient Active Problem List   Diagnosis Date Noted  . Acute respiratory failure with hypoxia ([place]) 10/06/2019  . COPD exacerbation ([place]) 10/06/2019  . Generalized weakness 07/12/2017  . Palliative care encounter 07/12/2017  . 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 Pressure injury of skin 05/30/2017  . Malnutrition of moderate degree 05/30/2017  . Hip fx, right, closed, initial encounter ([place]) 05/29/2017  . Closed displaced intertrochanteric fracture of right femur ([place]) 05/29/2017  . Dysphagia  05/29/2017  . Weight loss 05/29/2017  . Difficult airway for intubation 05/29/2017  . Lesion of epiglottis 05/26/2014  . Abnormal CT scan, pancreas or bile duct   . Right leg DVT ([place]) 05/08/2014  . Anemia 01/09/2014  . Hip pain, right 05/06/2011  . 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 CKD (chronic kidney disease) stage 3, GFR 30-59 ml/min (HCC) 03/04/2011  . Diastolic dysfunction 43/32/9518  . Tobacco abuse 03/04/2011  . Constipation 03/04/2011  . Malignant hypertensive urgency 03/01/2011  . Cerebral infarction ([place]) 03/01/2011  . NEUROPATHY, IDIOPATHIC 10/25/2007  . Hypercholesteremia 12/15/2006  . Essential hypertension 12/15/2006    Past Surgical History:  Procedure Laterality Date  . ABDOMINAL HYSTERECTOMY    . DIRECT LARYNGOSCOPY N/A 06/08/2017   Procedure: DIRECT LARYNGOSCOPY WITH BIOPSY;  Surgeon: Helayne Seminole, MD;  [facility];  Service: ENT;  Laterality: N/A;  . ENDOSCOPIC RETROGRADE CHOLANGIOPANCREATOGRAPHY (ERCP) WITH PROPOFOL N/A 01/11/2014   Procedure: ENDOSCOPIC RETROGRADE CHOLANGIOPANCREATOGRAPHY (ERCP) WITH PROPOFOL;  Surgeon: Gatha Mayer, MD;  Location: [place];  Service: Endoscopy;  Laterality: N/A;  . ERCP    . ERCP N/A 05/26/2014   Procedure: ENDOSCOPIC RETROGRADE CHOLANGIOPANCREATOGRAPHY (ERCP);  Surgeon: Gatha Mayer, MD;  Location: [facility];  Service: Endoscopy;  Laterality: N/A;  . ESOPHAGOGASTRODUODENOSCOPY (EGD) WITH PROPOFOL N/A 12/07/2014   Procedure: ESOPHAGOGASTRODUODENOSCOPY (EGD) WITH PROPOFOL;  Surgeon: Milus Banister, MD;  Location:  [facility] ENDOSCOPY;  Service: Endoscopy;  Laterality: N/A;  . EUS N/A 12/07/2014   Procedure: UPPER ENDOSCOPIC ULTRASOUND (EUS) LINEAR;  Surgeon: Milus Banister, MD;  Location: [facility] ENDOSCOPY;  Service: Endoscopy;  Laterality: N/A;  . GASTROINTESTINAL STENT REMOVAL N/A 12/07/2014   Procedure: GASTROINTESTINAL STENT REMOVAL;  Surgeon: Milus Banister, MD;  Location: [facility] ENDOSCOPY;  Service:  Endoscopy;  Laterality: N/A;  pancreatic stent removal  . HIP FRACTURE SURGERY    . INTRAMEDULLARY (IM) NAIL INTERTROCHANTERIC Right 05/29/2017   Procedure: INTRAMEDULLARY (IM) NAIL INTERTROCHANTRIC FEMUR;  Surgeon: Nicholes Stairs, MD;  Location: [place];  Service: Orthopedics;  Laterality: Right;  . PERCUTANEOUS TRACHEOSTOMY N/A 05/29/2017   Procedure: PERCUTANEOUS TRACHEOSTOMY;  Surgeon: Judeth Horn, MD;  Location: [place];  Service: General;  Laterality: N/A;  . TEE WITHOUT CARDIOVERSION  03/06/2011   Procedure: TRANSESOPHAGEAL ECHOCARDIOGRAM (TEE);  Surgeon: Jolaine Artist, MD;  Location: [facility] ENDOSCOPY;  Service: Cardiovascular;  Laterality: N/A;     OB History   No obstetric history on file.     Family History  Problem Relation Age of Onset  . Breast cancer Maternal Aunt   . Diabetes Mellitus I Father   . Diabetes Mellitus I Sister   . Anesthesia problems Neg Hx   . Hypotension Neg Hx   . Malignant hyperthermia Neg Hx   . Pseudochol deficiency Neg Hx   . Colon cancer Neg Hx   . Esophageal cancer Neg Hx   . Pancreatic cancer Neg Hx   . Kidney disease Neg Hx   . Liver disease Neg Hx     Social History   Tobacco Use  . Smoking status: Former Smoker    Packs/day: 0.25    Types: Cigarettes    Quit date: 05/29/2017    Years since quitting: 2.3  . Smokeless tobacco: Never Used  Substance Use Topics  . Alcohol use: No    Alcohol/week: 0.0 standard drinks  . Drug use: No    Home Medications Prior to Admission medications   Medication Sig Start Date End Date Taking? Authorizing Provider  acetaminophen (TYLENOL) 160 MG/5ML liquid Take 15.6 mLs (500 mg total) by mouth every 6 (six) hours as needed for pain. Patient not taking: Reported on 10/06/2019 11/28/17   Zigmund Gottron, NP  albuterol (PROVENTIL HFA;VENTOLIN HFA) 108 (90 Base) MCG/ACT inhaler Inhale 2 puffs into the lungs every 4 (four) hours as needed for wheezing or shortness of breath. Patient not taking: Reported  on 10/06/2019 04/12/18   Noemi Chapel, MD  albuterol (PROVENTIL) (2.5 MG/3ML) 0.083% nebulizer solution Take 3 mLs (2.5 mg total) by nebulization every 6 (six) hours as needed for wheezing or shortness of breath. Patient not taking: Reported on 10/06/2019 04/12/18   Noemi Chapel, MD  nystatin cream (MYCOSTATIN) Apply to affected area 2 times daily Patient not taking: Reported on 10/06/2019 04/12/18   Noemi Chapel, MD    Allergies    Patient has no known allergies.  Review of Systems   Review of Systems  All other systems reviewed and are negative.   Physical Exam Updated Vital Signs BP (!) 149/105   Pulse (!) 111   Temp 98.9 F (37.2 C)   Resp (!) 21   SpO2 96%   Physical Exam Vitals and nursing note reviewed.  Constitutional:      General: She is not in acute distress.    Appearance: She is well-developed. She is not ill-appearing, toxic-appearing or diaphoretic.  HENT:  Head: Normocephalic and atraumatic.     Right Ear: External ear normal.     Left Ear: External ear normal.  Eyes:     Conjunctiva/sclera: Conjunctivae normal.     Pupils: Pupils are equal, round, and reactive to light.  Neck:     Trachea: Phonation normal.  Cardiovascular:     Rate and Rhythm: Tachycardia present.  Pulmonary:     Effort: Pulmonary effort is normal. No respiratory distress.     Breath sounds: No stridor. No rhonchi.     Comments: Tracheostomy anterior neck, stoma open, no active bleeding, or drainage.  Respiratory therapy has suctioned some secretion from the ostomy, prior to my arrival into the room. Abdominal:     Palpations: Abdomen is soft.     Tenderness: There is no abdominal tenderness.  Musculoskeletal:        General: Normal range of motion.     Cervical back: Normal range of motion and neck supple.     Right lower leg: No edema.     Left lower leg: No edema.  Skin:    General: Skin is warm and dry.     Coloration: Skin is not jaundiced or pale.  Neurological:      Mental Status: She is alert and oriented to person, place, and time.     Cranial Nerves: No cranial nerve deficit.     Motor: No weakness or abnormal muscle tone.  Psychiatric:        Mood and Affect: Mood normal.        Behavior: Behavior normal.     ED Results / Procedures / Treatments   Labs (all labs ordered are listed, but only abnormal results are displayed) Labs Reviewed  C-REACTIVE PROTEIN - Abnormal; Notable for the following components:      Result Value   CRP 4.4 (*)    All other components within normal limits  COMPREHENSIVE METABOLIC PANEL - Abnormal; Notable for the following components:   Potassium 3.4 (*)    Calcium 8.4 (*)    All other components within normal limits  CBC WITH DIFFERENTIAL/PLATELET - Abnormal; Notable for the following components:   RBC 3.61 (*)    Hemoglobin 10.5 (*)    HCT 33.8 (*)    Platelets 137 (*)    All other components within normal limits  D-DIMER, QUANTITATIVE (NOT AT [facility]) - Abnormal; Notable for the following components:   D-Dimer, Quant 5.32 (*)    All other components within normal limits  FIBRINOGEN - Abnormal; Notable for the following components:   Fibrinogen 513 (*)    All other components within normal limits  BLOOD GAS, ARTERIAL - Abnormal; Notable for the following components:   pH, Arterial 7.205 (*)    pCO2 arterial 59.8 (*)    Acid-base deficit 5.8 (*)    All other components within normal limits  SARS CORONAVIRUS 2 BY RT PCR (HOSPITAL ORDER, [place] [facility])  EXPECTORATED SPUTUM ASSESSMENT W REFEX TO RESP CULTURE  FERRITIN  PROCALCITONIN  BLOOD GAS, VENOUS  HIV ANTIBODY (ROUTINE TESTING W REFLEX)  BASIC METABOLIC PANEL  CBC  LIPID PANEL  HEMOGLOBIN A1C  ABO/RH    EKG EKG Interpretation  Date/Time:  Thursday October 06 2019 09:21:11 EDT Ventricular Rate:  119 PR Interval:    QRS Duration: 73 QT Interval:  349 QTC Calculation: 491 R Axis:   32 Text Interpretation: Sinus  tachycardia Borderline repolarization abnormality Borderline prolonged QT interval Since last tracing rate  faster and QT is longer Otherwise no significant change Confirmed by Daleen Bo [phone]) on 10/06/2019 10:32:08 AM   Radiology CT Angio Chest PE W/Cm &/Or Wo Cm  Result Date: 10/06/2019 CLINICAL DATA:  Shortness of breath.  PE suspected. EXAM: CT ANGIOGRAPHY CHEST WITH CONTRAST TECHNIQUE: Multidetector CT imaging of the chest was performed using the standard protocol during bolus administration of intravenous contrast. Multiplanar CT image reconstructions and MIPs were obtained to evaluate the vascular anatomy. CONTRAST:  54mL OMNIPAQUE IOHEXOL 350 MG/ML SOLN COMPARISON:  CT angiogram chest dated 04/12/2018 FINDINGS: Cardiovascular: The most peripheral segmental and subsegmental pulmonary arteries bilaterally are difficult to characterize due to patient breathing motion artifact, however, there is no pulmonary embolism identified within the main, lobar or central segmental pulmonary arteries bilaterally. Heart size appears stable. No pericardial effusion. No thoracic aortic aneurysm or evidence of aortic dissection. Aortic atherosclerosis. Mediastinum/Nodes: No mass or enlarged lymph nodes are seen within the mediastinum or perihilar regions. Small hiatal hernia. Esophagus is unremarkable. Tracheostomy. Lungs/Pleura: Mild bibasilar atelectasis. Lungs otherwise clear. No pleural effusion or pneumothorax. Upper Abdomen: Limited images of the upper abdomen are unremarkable. Musculoskeletal: No acute or suspicious osseous finding. Review of the MIP images confirms the above findings. IMPRESSION: 1. No acute findings. No pulmonary embolism seen, with study limitations detailed above. No pneumonia or pulmonary edema. 2. Small hiatal hernia. Aortic Atherosclerosis (ICD10-I70.0). Electronically Signed   By: Franki Cabot M.D.   On: 10/06/2019 14:50   Portable chest 1 View  Result Date: 10/06/2019 CLINICAL  DATA:  Worsening cough and shortness of breath EXAM: PORTABLE CHEST 1 VIEW COMPARISON:  04/12/2018 FINDINGS: Cardiac shadow is stable. Aortic calcifications are seen. Tortuosity of the aorta is seen. No focal infiltrate or sizable effusion is noted. Minimal basilar atelectasis is noted on the right. No bony abnormality is seen. IMPRESSION: Mild right basilar atelectasis. Electronically Signed   By: Inez Catalina M.D.   On: 10/06/2019 09:13    Procedures .Critical Care Performed by: Daleen Bo, MD Authorized by: Daleen Bo, MD   Critical care provider statement:    Critical care time (minutes):  95   Critical care start time:  10/06/2019 8:46 AM   Critical care end time:  10/06/2019 4:50 PM   Critical care time was exclusive of:  Separately billable procedures and treating other patients   Critical care was time spent personally by me on the following activities:  Blood draw for specimens, development of treatment plan with patient or surrogate, discussions with consultants, evaluation of patient's response to treatment, examination of patient, obtaining history from patient or surrogate, ordering and performing treatments and interventions, ordering and review of laboratory studies, pulse oximetry, re-evaluation of patient's condition, review of old charts and ordering and review of radiographic studies   (including critical care time)  Medications Ordered in ED Medications  sodium chloride (PF) 0.9 % injection (has no administration in time range)  nystatin cream (MYCOSTATIN) (has no administration in time range)  enoxaparin (LOVENOX) injection 40 mg (has no administration in time range)  methylPREDNISolone sodium succinate (SOLU-MEDROL) 125 mg/2 mL injection 60 mg (has no administration in time range)  dextromethorphan-guaiFENesin (MUCINEX DM) 30-600 MG per 12 hr tablet 1 tablet (has no administration in time range)  azithromycin (ZITHROMAX) tablet 500 mg (has no administration in time  range)  labetalol (NORMODYNE) injection 5 mg (has no administration in time range)  ipratropium-albuterol (DUONEB) 0.5-2.5 (3) MG/3ML nebulizer solution 3 mL (has no administration in time range)  amLODipine (  NORVASC) tablet 10 mg (has no administration in time range)  potassium chloride SA (KLOR-CON) CR tablet 40 mEq (has no administration in time range)  iohexol (OMNIPAQUE) 350 MG/ML injection 100 mL (75 mLs Intravenous Contrast Given 10/06/19 1357)  LORazepam (ATIVAN) injection 0.5 mg (0.5 mg Intravenous Given 10/06/19 1334)  albuterol (PROVENTIL,VENTOLIN) solution continuous neb (10 mg/hr Nebulization Given 10/06/19 1644)  methylPREDNISolone sodium succinate (SOLU-MEDROL) 125 mg/2 mL injection 125 mg (125 mg Intravenous Given 10/06/19 1440)    ED Course  I have reviewed the triage vital signs and the nursing notes.  Pertinent labs & imaging results that were available during my care of the patient were reviewed by me and considered in my medical decision making (see chart for details).  Clinical Course as of Oct 05 1856  Thu Oct 06, 2019  1053 Normal  SARS Coronavirus 2 by RT PCR (hospital order, performed in [facility] hospital lab) Nasopharyngeal Nasopharyngeal Swab [EW]  1054 Normal  Ferritin [EW]  1054 Elevated  D-dimer, quantitative (not at [facility])(!) [EW]  1054 Elevated  Fibrinogen(!) [EW]  1054 Normal except hemoglobin low  CBC with Differential/Platelet(!) [EW]  1054 Normal   [EW]  1054 Elevated  C-reactive protein(!) [EW]  1054 Normal except potassium low  Comprehensive metabolic panel(!) [EW]  1610 Normal  Blood gas, venous (at [facility] and [facility], not at [facility]) [EW]  1055 CT angiogram ordered to evaluate for PE.  D-dimer elevated, patient without other explanation for acute shortness of breath.   [EW]  1329 Patient has not yet gone for CTA, because she wanted something to help her relax before that.  I learned of that just recently so will now order Ativan.   [EW]  9604  Consistent with acute respiratory failure with hypercapnia.  Blood gas, arterial(!) [EW]    Clinical Course User Index [EW] Daleen Bo, MD   MDM Rules/Calculators/A&P                           Patient Vitals for the past 24 hrs:  BP Temp Temp src Pulse Resp SpO2  10/06/19 1700 (!) 149/105 -- -- (!) 111 (!) 21 96 %  10/06/19 1645 -- -- -- -- -- 95 %  10/06/19 1629 -- -- -- (!) 108 (!) 21 95 %  10/06/19 1500 (!) 126/94 -- -- (!) 112 (!) 24 97 %  10/06/19 1444 -- -- -- (!) 121 (!) 28 96 %  10/06/19 1330 (!) 173/108 -- -- (!) 108 (!) 30 99 %  10/06/19 1112 (!) 173/108 98.9 F (37.2 C) -- (!) 114 (!) 35 99 %  10/06/19 1101 -- -- -- (!) 128 (!) 30 98 %  10/06/19 0902 (!) 174/115 -- -- (!) 106 20 98 %  10/06/19 0826 -- -- -- -- (!) 30 --  10/06/19 0814 (!) 184/126 99 F (37.2 C) Oral (!) 116 (!) 30 100 %    5:30 PM Reevaluation with update and discussion. After initial assessment and treatment, an updated evaluation reveals hypoxia on room air to 88%, within 4 minutes of changing from 2 L to 0 L oxygen, administered through tracheostomy. Daleen Bo   Medical Decision Making:  This patient is presenting for evaluation of shortness of breath, which does require a range of treatment options, and is a complaint that involves a high risk of morbidity and mortality. The differential diagnoses include bronchitis, pneumonia, heart failure. I decided to review old records, and in summary old  female with history of "throat cancer," currently being managed at home with a open ostomy. I obtained additional historical information from family members.  Clinical Laboratory Tests Ordered, included CBC, Metabolic panel, Urinalysis and Covid testing, Covid inflammatory markers, arterial blood gas. Review indicates no evidence for acute bacterial infection.  Nonspecific elevation of Covid markers.  Covid test negative.  D-dimer markedly elevated.  Arterial blood gas consistent with acute respiratory  failure and hypercapnia.. Radiologic Tests Ordered, included chest x-ray, CT angiogram to rule out PE.  I independently Visualized: Radiographic images, which show no acute pulmonary abnormalities per imaging testing  Cardiac Monitor Tracing which shows sinus tachycardia    Critical Interventions-clinical evaluation, laboratory testing, x-ray chest, CT chest, repeat laboratory test, observation, manipulation of oxygen therapy.  After These Interventions, the Patient was reevaluated and was found to require oxygen treatment and likely having COPD exacerbation leading to acute respiratory failure with hypercapnia and hypoxia.  Doubt imminent need for advanced airway.  Note that this patient's tracheostomy would not likely be functional for placement of a usual size endotracheal tube.  The patient is mentating well, and does not require intubation.  Will contact hospitalist service to admit patient.  Maddyn Lieurance was evaluated in Emergency Department on 10/06/2019 for the symptoms described in the history of present illness. She was evaluated in the context of the global COVID-19 pandemic, which necessitated consideration that the patient might be at risk for infection with the SARS-CoV-2 virus that causes COVID-19. Institutional protocols and algorithms that pertain to the evaluation of patients at risk for COVID-19 are in a state of rapid change based on information released by regulatory bodies including the CDC and federal and state organizations. These policies and algorithms were followed during the patient's care in the ED.   CRITICAL CARE-yes Performed by: Daleen Bo  Nursing Notes Reviewed/ Care Coordinated Applicable Imaging Reviewed Interpretation of Laboratory Data incorporated into ED treatment  Plan: Admit    Final Clinical Impression(s) / ED Diagnoses Final diagnoses:  Shortness of breath  COPD exacerbation ([place])  Hypoxia  Acute respiratory failure with hypercapnia  [facility])    Rx / DC Orders ED Discharge Orders    None       Daleen Bo, MD 10/06/19 1902

## 2019-10-06 NOTE — H&P (Signed)
History and Physical    Katelyn [name] GYI:[id number] DOB: [birth year] DOA: 10/06/2019  PCP: Aretta Nip, MD   Patient coming from: Home    Chief Complaint: Shortness of breath, cough  HPI: Katelyn [name] is a 69 y.o. female with medical history significant of laryngeal cancer status post total laryngectomy status post neck dissection and  currently on a stoma, hypertension, hyperlipidemia, nonhemorrhagic CVA, right leg DVT, hip fracture, diastolic CHF who presents to the emergency department with complaint of shortness of breath and cough that started yesterday.  Since she became increasingly dyspneic, she was brought to the emergency department.   Patient lives with her boyfriend.  In the emergency department she was accompanied by her daughter.  As per the daughter, she started having severe shortness of breath, cough since yesterday.  Cough is dry.  She does not use oxygen at home.  Patient previously had a tracheostomy but currently without a tube and just has a  stoma .  She is extremely noncompliant and has not taken any medications for last 1 year nor has seen a doctor.  She is a previous smoker.  There is no history of asthma or COPD documented on her chart.  She had her first Covid vaccine about 10 days ago.  Patient ambulates with the help of walker but has trouble with gait. Patient seen and examined at the bedside in the emergency department.  Currently she was on 2 L of oxygen per minute.  She looked short of breath and tachypneic.  There is no report of fever, chills nausea, vomiting, headache, hematochezia, melena, dysuria  ED Course: Patient was tachypneic and tachycardic on presentation.  Requiring 5 to 6 L of oxygen by trach collar.  She was given a dose of Solu-Medrol.  Found to be  hypertensive.  ABG showed PCO2 of 60.  Procalcitonin negative.  Elevated CRP.  D-dimer was elevated.  Chest x-ray showed mild right basilar atelectasis.  CT angiogram of the chest did not  show any PE, did not show any pneumonia or effusion.  Patient was admitted for the management of COPD exacerbation.  Review of Systems: As per HPI otherwise 10 point review of systems negative.    Past Medical History:  Diagnosis Date  . Acute gallstone pancreatitis 01/08/2014  . AKI (acute kidney injury) ([place]) 01/08/2014  . Anemia   . Blood transfusion    11'15 last admission  . Cancer ([place])   . Cholecystitis   . H/O tracheostomy   . Hip fracture ([place])   . Hypercholesteremia   . Hypertension   . Right leg DVT ([place]) 05/08/2014  . Septic shock - Kelbsiella 01/09/2014  . Shortness of breath   . Stroke [facility]    '13- right side weakness, ambulates with cane    Past Surgical History:  Procedure Laterality Date  . ABDOMINAL HYSTERECTOMY    . DIRECT LARYNGOSCOPY N/A 06/08/2017   Procedure: DIRECT LARYNGOSCOPY WITH BIOPSY;  Surgeon: Helayne Seminole, MD;  Location: [facility] OR;  Service: ENT;  Laterality: N/A;  . ENDOSCOPIC RETROGRADE CHOLANGIOPANCREATOGRAPHY (ERCP) WITH PROPOFOL N/A 01/11/2014   Procedure: ENDOSCOPIC RETROGRADE CHOLANGIOPANCREATOGRAPHY (ERCP) WITH PROPOFOL;  Surgeon: Gatha Mayer, MD;  Location: [place];  Service: Endoscopy;  Laterality: N/A;  . ERCP    . ERCP N/A 05/26/2014   Procedure: ENDOSCOPIC RETROGRADE CHOLANGIOPANCREATOGRAPHY (ERCP);  Surgeon: Gatha Mayer, MD;  Location: [facility]OSCOPY;  Service: Endoscopy;  Laterality: N/A;  . ESOPHAGOGASTRODUODENOSCOPY (EGD) WITH PROPOFOL N/A 12/07/2014   Procedure:  ESOPHAGOGASTRODUODENOSCOPY (EGD) WITH PROPOFOL;  Surgeon: Milus Banister, MD;  Location: [facility] ENDOSCOPY;  Service: Endoscopy;  Laterality: N/A;  . EUS N/A 12/07/2014   Procedure: UPPER ENDOSCOPIC ULTRASOUND (EUS) LINEAR;  Surgeon: Milus Banister, MD;  Location: [facility] ENDOSCOPY;  Service: Endoscopy;  Laterality: N/A;  . GASTROINTESTINAL STENT REMOVAL N/A 12/07/2014   Procedure: GASTROINTESTINAL STENT REMOVAL;  Surgeon: Milus Banister, MD;  Location: [facility] ENDOSCOPY;   Service: Endoscopy;  Laterality: N/A;  pancreatic stent removal  . HIP FRACTURE SURGERY    . INTRAMEDULLARY (IM) NAIL INTERTROCHANTERIC Right 05/29/2017   Procedure: INTRAMEDULLARY (IM) NAIL INTERTROCHANTRIC FEMUR;  Surgeon: Nicholes Stairs, MD;  Location: [place];  Service: Orthopedics;  Laterality: Right;  . PERCUTANEOUS TRACHEOSTOMY N/A 05/29/2017   Procedure: PERCUTANEOUS TRACHEOSTOMY;  Surgeon: Judeth Horn, MD;  Location: [place];  Service: General;  Laterality: N/A;  . TEE WITHOUT CARDIOVERSION  03/06/2011   Procedure: TRANSESOPHAGEAL ECHOCARDIOGRAM (TEE);  Surgeon: Jolaine Artist, MD;  Location[facility] ENDOSCOPY;  Service: Cardiovascular;  Laterality: N/A;     reports that she quit smoking about 2 years ago. Her smoking use included cigarettes. She smoked 0.25 packs per day. She has never used smokeless tobacco. She reports that she does not drink alcohol and does not use drugs.  No Known Allergies  Family History  Problem Relation Age of Onset  . Breast cancer Maternal Aunt   . Diabetes Mellitus I Father   . Diabetes Mellitus I Sister   . Anesthesia problems Neg Hx   . Hypotension Neg Hx   . Malignant hyperthermia Neg Hx   . Pseudochol deficiency Neg Hx   . Colon cancer Neg Hx   . Esophageal cancer Neg Hx   . Pancreatic cancer Neg Hx   . Kidney disease Neg Hx   . Liver disease Neg Hx      Prior to Admission medications   Medication Sig Start Date End Date Taking? Authorizing Provider  acetaminophen (TYLENOL) 160 MG/5ML liquid Take 15.6 mLs (500 mg total) by mouth every 6 (six) hours as needed for pain. Patient not taking: Reported on 10/06/2019 11/28/17   Zigmund Gottron, NP  albuterol (PROVENTIL HFA;VENTOLIN HFA) 108 (90 Base) MCG/ACT inhaler Inhale 2 puffs into the lungs every 4 (four) hours as needed for wheezing or shortness of breath. Patient not taking: Reported on 10/06/2019 04/12/18   Noemi Chapel, MD  albuterol (PROVENTIL) (2.5 MG/3ML) 0.083% nebulizer solution Take  3 mLs (2.5 mg total) by nebulization every 6 (six) hours as needed for wheezing or shortness of breath. Patient not taking: Reported on 10/06/2019 04/12/18   Noemi Chapel, MD  nystatin cream (MYCOSTATIN) Apply to affected area 2 times daily Patient not taking: Reported on 10/06/2019 04/12/18   Noemi Chapel, MD    Physical Exam: Vitals:   10/06/19 1500 10/06/19 1629 10/06/19 1645 10/06/19 1700  BP: (!) 126/94   (!) 149/105  Pulse: (!) 112 (!) 108  (!) 111  Resp: (!) 24 (!) 21  (!) 21  Temp:      TempSrc:      SpO2: 97% 95% 95% 96%    Constitutional: In mild to moderate respiratory distress, deconditioned, debilitated Vitals:   10/06/19 1500 10/06/19 1629 10/06/19 1645 10/06/19 1700  BP: (!) 126/94   (!) 149/105  Pulse: (!) 112 (!) 108  (!) 111  Resp: (!) 24 (!) 21  (!) 21  Temp:      TempSrc:      SpO2: 97% 95% 95%  96%   Eyes: PERRL, lids and conjunctivae normal ENMT: Mucous membranes are moist.   Neck: normal, supple, no masses, no thyromegaly.Tracheostomy with the stoma only. Respiratory:Severely decreased air entry bilaterally, no clear wheezes or crackles Cardiovascular: Sinus tachycardia, no murmurs / rubs / gallops. No extremity edema.  Abdomen: no tenderness, no masses palpated. No hepatosplenomegaly. Bowel sounds positive.  Musculoskeletal: no clubbing / cyanosis. No joint deformity upper and lower extremities.  Skin: Erythematous rash below her breast bilaterally Neurologic: CN 2-12 grossly intact.  Mild weakness in the right side Psychiatric: Normal judgment and insight. Alert and oriented x 3. Normal mood.   Foley Catheter:None  Labs on Admission: I have personally reviewed following labs and imaging studies  CBC: Recent Labs  Lab 10/06/19 0919  WBC 7.7  NEUTROABS 5.9  HGB 10.5*  HCT 33.8*  MCV 93.6  PLT 233*   Basic Metabolic Panel: Recent Labs  Lab 10/06/19 0919  NA 141  K 3.4*  CL 108  CO2 25  GLUCOSE 89  BUN 18  CREATININE 0.84  CALCIUM 8.4*    GFR: CrCl cannot be calculated (Unknown ideal weight.). Liver Function Tests: Recent Labs  Lab 10/06/19 0919  AST 21  ALT 14  ALKPHOS 91  BILITOT 0.7  PROT 6.9  ALBUMIN 3.6   No results for input(s): LIPASE, AMYLASE in the last 168 hours. No results for input(s): AMMONIA in the last 168 hours. Coagulation Profile: No results for input(s): INR, PROTIME in the last 168 hours. Cardiac Enzymes: No results for input(s): CKTOTAL, CKMB, CKMBINDEX, TROPONINI in the last 168 hours. BNP (last 3 results) No results for input(s): PROBNP in the last 8760 hours. HbA1C: No results for input(s): HGBA1C in the last 72 hours. CBG: No results for input(s): GLUCAP in the last 168 hours. Lipid Profile: No results for input(s): CHOL, HDL, LDLCALC, TRIG, CHOLHDL, LDLDIRECT in the last 72 hours. Thyroid Function Tests: No results for input(s): TSH, T4TOTAL, FREET4, T3FREE, THYROIDAB in the last 72 hours. Anemia Panel: Recent Labs    10/06/19 0919  FERRITIN 99   Urine analysis:    Component Value Date/Time   COLORURINE YELLOW 07/10/2017 0053   APPEARANCEUR HAZY (A) 07/10/2017 0053   LABSPEC 1.025 07/10/2017 0053   PHURINE 5.0 07/10/2017 0053   GLUCOSEU NEGATIVE 07/10/2017 0053   HGBUR NEGATIVE 07/10/2017 0053   HGBUR trace-lysed 12/30/2006 0956   BILIRUBINUR SMALL (A) 07/10/2017 0053   KETONESUR NEGATIVE 07/10/2017 0053   PROTEINUR 100 (A) 07/10/2017 0053   UROBILINOGEN 4.0 (H) 01/07/2014 2323   NITRITE NEGATIVE 07/10/2017 0053   LEUKOCYTESUR TRACE (A) 07/10/2017 0053    Radiological Exams on Admission: CT Angio Chest PE W/Cm &/Or Wo Cm  Result Date: 10/06/2019 CLINICAL DATA:  Shortness of breath.  PE suspected. EXAM: CT ANGIOGRAPHY CHEST WITH CONTRAST TECHNIQUE: Multidetector CT imaging of the chest was performed using the standard protocol during bolus administration of intravenous contrast. Multiplanar CT image reconstructions and MIPs were obtained to evaluate the vascular  anatomy. CONTRAST:  71mL OMNIPAQUE IOHEXOL 350 MG/ML SOLN COMPARISON:  CT angiogram chest dated 04/12/2018 FINDINGS: Cardiovascular: The most peripheral segmental and subsegmental pulmonary arteries bilaterally are difficult to characterize due to patient breathing motion artifact, however, there is no pulmonary embolism identified within the main, lobar or central segmental pulmonary arteries bilaterally. Heart size appears stable. No pericardial effusion. No thoracic aortic aneurysm or evidence of aortic dissection. Aortic atherosclerosis. Mediastinum/Nodes: No mass or enlarged lymph nodes are seen within the mediastinum or perihilar  regions. Small hiatal hernia. Esophagus is unremarkable. Tracheostomy. Lungs/Pleura: Mild bibasilar atelectasis. Lungs otherwise clear. No pleural effusion or pneumothorax. Upper Abdomen: Limited images of the upper abdomen are unremarkable. Musculoskeletal: No acute or suspicious osseous finding. Review of the MIP images confirms the above findings. IMPRESSION: 1. No acute findings. No pulmonary embolism seen, with study limitations detailed above. No pneumonia or pulmonary edema. 2. Small hiatal hernia. Aortic Atherosclerosis (ICD10-I70.0). Electronically Signed   By: Franki Cabot M.D.   On: 10/06/2019 14:50   Portable chest 1 View  Result Date: 10/06/2019 CLINICAL DATA:  Worsening cough and shortness of breath EXAM: PORTABLE CHEST 1 VIEW COMPARISON:  04/12/2018 FINDINGS: Cardiac shadow is stable. Aortic calcifications are seen. Tortuosity of the aorta is seen. No focal infiltrate or sizable effusion is noted. Minimal basilar atelectasis is noted on the right. No bony abnormality is seen. IMPRESSION: Mild right basilar atelectasis. Electronically Signed   By: Inez Catalina M.D.   On: 10/06/2019 09:13     Assessment/Plan Principal Problem:   Acute respiratory failure with hypoxia (HCC) Active Problems:   Hypercholesteremia   Essential hypertension   Cerebral infarction  (HCC)   Diastolic dysfunction   Right leg DVT (HCC)   Generalized weakness   COPD exacerbation (HCC)   Acute respiratory failure with hypoxia and hypercarbia: Presented with severe shortness of breath, dry cough.  Most likely from COPD exacerbation.  Currently requiring 2 to 3 L of oxygen per minute.  COPD exacerbation: No documented history of COPD as per the chart.  Presented with dyspnea, cough. ABG showed hypercarbia.  Started on Solu-Medrol.  Also started on azithromycin.  She needs to follow-up with pulmonology as an outpatient for PFTs.  Continue bronchodilators.  Continue supplemental oxygen as needed.  Laryngeal cancer: Status post total laryngectomy, neck dissection.  Currently in remission.  She used  to follow-up with Dr. Vicie Mutters, ENT, at [facility].  Currently has a stoma only at her tracheostomy site.  We recommend to follow-up with ENT as an outpatient.  She is extremely noncompliant and has not taken any medications and has not seen a physician for a year.  Elevated D-dimer/history of right lower extremity DVT: Currently not on any medications.  Daughter unsure about the previous medication she was taking.  CT angiogram did not show any PE.  Will check Doppler of the bilateral lower extremities  Chronic diastolic congestive heart failure: Echo done on 05/2017 showed ejection fraction of 55 to 63%, grade 1 diastolic dysfunction.  Currently she appears euvolemic.  Does not take any diuretics at home.  History of nonhemorrhagic stroke: Has mild right hemiparesis.  Does not take aspirin or Plavix at home.  Daughter states she was previously on aspirin which was stopped by her previous providers.  Hyperlipidemia: Previously taking cholesterol medications but not taking anything at present.  Will check lipid panel.  Will likely put her on statin on discharge.  Hypertension: Noted to be hypertensive on presentation.  Not taking any medication at home.  Continue as needed  medications.  Started on amlodipine.  Generalized weakness: Has gait problem.  Ambulates minimally with the help of walker.  Will request a PT/OT evaluation.     Severity of Illness: The appropriate patient status for this patient is INPATIENT  DVT prophylaxis: Lovenox Code Status: Full Family Communication: daughter at bedside Consults called: None     Shelly Coss MD [facility]  10/06/2019, 6:24 PM

## 2019-10-06 NOTE — ED Triage Notes (Signed)
MS reports from home, called out for SOB since yesterday, Trach Pt. Hx of throat cancer. Family states has not had tube/trach in place since 2019, and has also had productive cough since yesterday as well and says possible tissue expelled.  BP 202/130 HR 120 RR 40 Sp02 90 RA (100 6ltrs 02 Blow by over trach stoma)  20ga LAC  Pt had first Covid Vacc on 5th

## 2019-10-06 NOTE — Progress Notes (Signed)
Pt came into ed with stoma. Pts family reports that patient has not had trach in place since 2019. Suctioned patients stoma. Small amount of secretions were obtained. Placed pt on 28% aerosol trach collar for humidity. O2 saturations were 96% on Ra before collar placed on pt.

## 2019-10-07 ENCOUNTER — Inpatient Hospital Stay (HOSPITAL_COMMUNITY): Payer: Medicare Other

## 2019-10-07 DIAGNOSIS — J9601 Acute respiratory failure with hypoxia: Secondary | ICD-10-CM

## 2019-10-07 DIAGNOSIS — I5031 Acute diastolic (congestive) heart failure: Secondary | ICD-10-CM | POA: Diagnosis not present

## 2019-10-07 LAB — CBC
HCT: 41.7 % (ref 36.0–46.0)
Hemoglobin: 12.9 g/dL (ref 12.0–15.0)
MCH: 28.5 pg (ref 26.0–34.0)
MCHC: 30.9 g/dL (ref 30.0–36.0)
MCV: 92.1 fL (ref 80.0–100.0)
Platelets: 168 10*3/uL (ref 150–400)
RBC: 4.53 MIL/uL (ref 3.87–5.11)
RDW: 12.3 % (ref 11.5–15.5)
WBC: 7 10*3/uL (ref 4.0–10.5)
nRBC: 0 % (ref 0.0–0.2)

## 2019-10-07 LAB — BASIC METABOLIC PANEL
Anion gap: 10 (ref 5–15)
BUN: 29 mg/dL — ABNORMAL HIGH (ref 8–23)
CO2: 24 mmol/L (ref 22–32)
Calcium: 9.7 mg/dL (ref 8.9–10.3)
Chloride: 105 mmol/L (ref 98–111)
Creatinine, Ser: 1.16 mg/dL — ABNORMAL HIGH (ref 0.44–1.00)
GFR calc Af Amer: 55 mL/min — ABNORMAL LOW (ref >60)
GFR calc non Af Amer: 48 mL/min — ABNORMAL LOW (ref >60)
Glucose, Bld: 157 mg/dL — ABNORMAL HIGH (ref 70–99)
Potassium: 4.2 mmol/L (ref 3.5–5.1)
Sodium: 139 mmol/L (ref 135–145)

## 2019-10-07 LAB — ECHOCARDIOGRAM COMPLETE
AR max vel: 1.15 cm2
AV Area VTI: 1.37 cm2
AV Area mean vel: 1.29 cm2
AV Mean grad: 8 mmHg
AV Peak grad: 16.6 mmHg
Ao pk vel: 2.04 m/s
Area-P 1/2: 3.31 cm2
Height: 59 in
P 1/2 time: 326 msec
S' Lateral: 2.2 cm
Weight: 2320 oz

## 2019-10-07 LAB — LIPID PANEL
Cholesterol: 287 mg/dL — ABNORMAL HIGH (ref 0–200)
HDL: 53 mg/dL (ref >40)
LDL Cholesterol: 217 mg/dL — ABNORMAL HIGH (ref 0–99)
Total CHOL/HDL Ratio: 5.4 RATIO
Triglycerides: 83 mg/dL (ref <150)
VLDL: 17 mg/dL (ref 0–40)

## 2019-10-07 LAB — HEMOGLOBIN A1C
Hgb A1c MFr Bld: 5.4 % (ref 4.8–5.6)
Mean Plasma Glucose: 108.28 mg/dL

## 2019-10-07 LAB — BRAIN NATRIURETIC PEPTIDE: B Natriuretic Peptide: 91.3 pg/mL (ref 0.0–100.0)

## 2019-10-07 MED ORDER — ATORVASTATIN CALCIUM 40 MG PO TABS
80.0000 mg | ORAL_TABLET | Freq: Every day | ORAL | Status: DC
  Administered 2019-10-08 – 2019-10-18 (×11): 80 mg via ORAL
  Filled 2019-10-07 (×11): qty 2

## 2019-10-07 MED ORDER — SODIUM CHLORIDE 3 % IN NEBU
4.0000 mL | INHALATION_SOLUTION | Freq: Every day | RESPIRATORY_TRACT | Status: AC
  Administered 2019-10-08 – 2019-10-09 (×2): 4 mL via RESPIRATORY_TRACT
  Filled 2019-10-07 (×3): qty 4

## 2019-10-07 MED ORDER — GUAIFENESIN-DM 100-10 MG/5ML PO SYRP
10.0000 mL | ORAL_SOLUTION | Freq: Four times a day (QID) | ORAL | Status: DC
  Administered 2019-10-07 – 2019-10-18 (×40): 10 mL via ORAL
  Filled 2019-10-07 (×40): qty 10

## 2019-10-07 MED ORDER — GUAIFENESIN-DM 100-10 MG/5ML PO SYRP
15.0000 mL | ORAL_SOLUTION | ORAL | Status: AC
  Administered 2019-10-07: 15 mL via ORAL
  Filled 2019-10-07: qty 20

## 2019-10-07 MED ORDER — FUROSEMIDE 10 MG/ML IJ SOLN
40.0000 mg | Freq: Once | INTRAMUSCULAR | Status: AC
  Administered 2019-10-07: 40 mg via INTRAVENOUS
  Filled 2019-10-07: qty 4

## 2019-10-07 MED ORDER — IPRATROPIUM-ALBUTEROL 0.5-2.5 (3) MG/3ML IN SOLN
3.0000 mL | Freq: Three times a day (TID) | RESPIRATORY_TRACT | Status: DC
  Administered 2019-10-07 – 2019-10-08 (×4): 3 mL via RESPIRATORY_TRACT
  Filled 2019-10-07 (×4): qty 3

## 2019-10-07 MED ORDER — ORAL CARE MOUTH RINSE
15.0000 mL | Freq: Two times a day (BID) | OROMUCOSAL | Status: DC
  Administered 2019-10-07 – 2019-10-18 (×21): 15 mL via OROMUCOSAL

## 2019-10-07 MED ORDER — ALBUTEROL SULFATE (2.5 MG/3ML) 0.083% IN NEBU
2.5000 mg | INHALATION_SOLUTION | RESPIRATORY_TRACT | Status: DC | PRN
  Administered 2019-10-09 – 2019-10-15 (×3): 2.5 mg via RESPIRATORY_TRACT
  Filled 2019-10-07 (×4): qty 3

## 2019-10-07 NOTE — ED Notes (Addendum)
RN called to room d/t Pt being in distress and O2 sat dropping.  Pt noted to have O2 sat in mid 70s.  Katelyn Evert RN placed non-rebreather, Respiratory, and EDP to bedside.  Respiratory suctioned a large mucus plug out of trach.  Pt's sats increased back to mid 90s.  Vitals rechecked.

## 2019-10-07 NOTE — ED Notes (Signed)
Assumed care of pt at this time. Pt resting in stretcher, no s/sx of acute distress at this time.  

## 2019-10-07 NOTE — ED Notes (Signed)
Pt given breakfast tray, watching TV. NAD

## 2019-10-07 NOTE — ED Notes (Signed)
Attempted to call report, will try again.

## 2019-10-07 NOTE — Progress Notes (Signed)
Lower extremity venous bilateral study completed  Preliminary results relayed to Joelene Millin, [place].  See CV Proc for preliminary results report.   Darlin Coco

## 2019-10-07 NOTE — ED Notes (Addendum)
Pt observed having difficulty breathing and distressed. 02 saturation decreased from 88% to 77%. Oxygen to trach collar increased and placed on non-rebreather. Vitals rechecked. Pt coughs up large mucous plug and vitals and o2 return back to normal. Oxygen decreased to baseline. Dr. Wyvonnia Dusky present to assess patient and intervene.

## 2019-10-07 NOTE — Progress Notes (Signed)
Received patient from ED, telemetry applied, VS obtained, oriented to unit, call light placed in reach

## 2019-10-07 NOTE — ED Notes (Signed)
Report to floor

## 2019-10-07 NOTE — ED Notes (Signed)
Pt is sleeping. NAD noted.

## 2019-10-07 NOTE — Progress Notes (Signed)
PROGRESS NOTE    Katelyn [name]  XBM:[id number] DOB: Jul 09, 1950 DOA: 10/06/2019 PCP: Aretta Nip, MD   Brief Narrative: Katelyn [name] is a 69 y.o. female with medical history significant of laryngeal cancer status post total laryngectomy status post neck dissection and  currently on a stoma, hypertension, hyperlipidemia, nonhemorrhagic CVA, right leg DVT, hip fracture, diastolic CHF who presents to the emergency department with complaint of shortness of breath and cough .  She was admitted for the management of possible COPD exacerbation.  Hospital course remarkable for persistent requirement of oxygen.  Chest x-ray done this morning showed possible pulmonary edema.PCCM also consulted  Assessment & Plan:   Principal Problem:   Acute respiratory failure with hypoxia ([place]) Active Problems:   Hypercholesteremia   Essential hypertension   Cerebral infarction (HCC)   Diastolic dysfunction   Right leg DVT (HCC)   Generalized weakness   COPD exacerbation (HCC)     Acute respiratory failure with hypoxia and hypercarbia: Presented with severe shortness of breath, dry cough.  Most likely from COPD exacerbation/acute diastolic congestive heart failure .  Currently requiring 5 L of oxygen per minute.  COPD exacerbation: No documented history of COPD as per the chart.  Presented with dyspnea, cough. ABG showed hypercarbia.  Started on Solu-Medrol.  Also started on azithromycin.  She needs to follow-up with pulmonology as an outpatient for PFTs.  Continue bronchodilators.  Continue supplemental oxygen as needed.  Laryngeal cancer: Status post total laryngectomy, neck dissection.  Currently in remission.  She used  to follow-up with Dr. Vicie Mutters, ENT, at [facility].  Currently has a stoma only at her tracheostomy site.  We recommend to follow-up with ENT as an outpatient.  She is extremely noncompliant and has not taken any medications and has not seen a physician for a  year.  Elevated D-dimer/history of right lower extremity DVT: Currently not on any medications.  Daughter unsure about the previous medication she was taking.  CT angiogram did not show any PE.  Will check Doppler of the bilateral lower extremities  Chronic diastolic congestive heart failure: Echo done on this admission showed ejection fraction of 66 5%, grade 1 diastolic dysfunction.  Does not take any diuretics at home. Chest x-ray done on 10/07/2019 showed pulmonary vascular congestion, bibasilar airspace disease consistent with edema or infection.  Will check BNP.  Given a dose of Lasix IV 40 mg once  History of nonhemorrhagic stroke: Has mild right hemiparesis.  Does not take aspirin or Plavix at home.  Daughter states she was previously on aspirin which was stopped by her previous providers.  Hyperlipidemia: Previously taking cholesterol medications but not taking anything at present.  LDL more than 200.  Start on Lipitor 80 mg daily.  Hypertension: Noted to be hypertensive on presentation.  Not taking any medication at home.  Continue as needed medications.  Started on amlodipine.  Rash: Most likely fungal rash beneath her breasts.  Continue nystatin  Generalized weakness: Has gait problem.  Ambulates minimally with the help of walker.  Will request a PT/OT evaluation.       DVT prophylaxis:Lovenox Code Status: Full Family Communication: Discussed with daughter at bedside on 10/06/2019 Status is: Inpatient  Remains inpatient appropriate because:Hemodynamically unstable   Dispo: The patient is from: Home              Anticipated d/c is to: Home              Anticipated d/c date is: 2  days              Patient currently is not medically stable to d/c.     Consultants: PCCM  Procedures:None  Antimicrobials:  Anti-infectives (From admission, onward)   Start     Dose/Rate Route Frequency Ordered Stop   10/06/19 1930  azithromycin (ZITHROMAX) tablet 500 mg      Discontinue     500 mg Oral Daily 10/06/19 1807 10/09/19 0959      Subjective:  Patient seen and examined the bedside this morning.  Appears better than yesterday.  Less short of breath but she desaturated last night and had to be suctioned.  Eager to go home.  Continues to require 5 L of oxygen per minute.  Objective: Vitals:   10/07/19 0721 10/07/19 0723 10/07/19 0800 10/07/19 0830  BP:   128/88 (!) 153/99  Pulse:  (!) 101 96 (!) 106  Resp:  17 (!) 21 (!) 24  Temp:      TempSrc:      SpO2: 98% 98% 94% 93%  Weight:      Height:       No intake or output data in the 24 hours ending 10/07/19 0838 Filed Weights   10/06/19 2140  Weight: 65.8 kg    Examination:  General exam: Deconditioned, debilitated HEENT: Tracheostomy Respiratory system: Bilateral diminished air sounds but no crackles or wheezes cardiovascular system: S1 & S2 heard, RRR. No JVD, murmurs, rubs, gallops or clicks. No pedal edema. Gastrointestinal system: Abdomen is nondistended, soft and nontender. No organomegaly or masses felt. Normal bowel sounds heard. Central nervous system: Alert and oriented. No focal neurological deficits. Extremities: No edema, no clubbing ,no cyanosis, distal peripheral pulses palpable. Skin: Erythematous rash below the breasts,no  lesions or ulcers,no icterus ,no pallor     Data Reviewed: I have personally reviewed following labs and imaging studies  CBC: Recent Labs  Lab 10/06/19 0919 10/07/19 0431  WBC 7.7 7.0  NEUTROABS 5.9  --   HGB 10.5* 12.9  HCT 33.8* 41.7  MCV 93.6 92.1  PLT 137* 657   Basic Metabolic Panel: Recent Labs  Lab 10/06/19 0919 10/07/19 0431  NA 141 139  K 3.4* 4.2  CL 108 105  CO2 25 24  GLUCOSE 89 157*  BUN 18 29*  CREATININE 0.84 1.16*  CALCIUM 8.4* 9.7   GFR: Estimated Creatinine Clearance: 37.2 mL/min (A) (by C-G formula based on SCr of 1.16 mg/dL (H)). Liver Function Tests: Recent Labs  Lab 10/06/19 0919  AST 21  ALT 14   ALKPHOS 91  BILITOT 0.7  PROT 6.9  ALBUMIN 3.6   No results for input(s): LIPASE, AMYLASE in the last 168 hours. No results for input(s): AMMONIA in the last 168 hours. Coagulation Profile: No results for input(s): INR, PROTIME in the last 168 hours. Cardiac Enzymes: No results for input(s): CKTOTAL, CKMB, CKMBINDEX, TROPONINI in the last 168 hours. BNP (last 3 results) No results for input(s): PROBNP in the last 8760 hours. HbA1C: Recent Labs    10/07/19 0413  HGBA1C 5.4   CBG: No results for input(s): GLUCAP in the last 168 hours. Lipid Profile: Recent Labs    10/07/19 0431  CHOL 287*  HDL 53  LDLCALC 217*  TRIG 83  CHOLHDL 5.4   Thyroid Function Tests: No results for input(s): TSH, T4TOTAL, FREET4, T3FREE, THYROIDAB in the last 72 hours. Anemia Panel: Recent Labs    10/06/19 0919  FERRITIN 99   Sepsis Labs: Recent Labs  Lab 10/06/19 0919  PROCALCITON <0.10    Recent Results (from the past 240 hour(s))  SARS Coronavirus 2 by RT PCR (hospital order, performed in [facility] hospital lab) Nasopharyngeal Nasopharyngeal Swab     Status: None   Collection Time: 10/06/19  9:28 AM   Specimen: Nasopharyngeal Swab  Result Value Ref Range Status   SARS Coronavirus 2 NEGATIVE NEGATIVE Final    Comment: (NOTE) SARS-CoV-2 target nucleic acids are NOT DETECTED.  The SARS-CoV-2 RNA is generally detectable in upper and lower respiratory specimens during the acute phase of infection. The lowest concentration of SARS-CoV-2 viral copies this assay can detect is 250 copies / mL. A negative result does not preclude SARS-CoV-2 infection and should not be used as the sole basis for treatment or other patient management decisions.  A negative result may occur with improper specimen collection / handling, submission of specimen other than nasopharyngeal swab, presence of viral mutation(s) within the areas targeted by this assay, and inadequate number of viral copies (<250  copies / mL). A negative result must be combined with clinical observations, patient history, and epidemiological information.  Fact Sheet for Patients:   StrictlyIdeas.no  Fact Sheet for Healthcare Providers: BankingDealers.co.za  This test is not yet approved or  cleared by the [place] FDA and has been authorized for detection and/or diagnosis of SARS-CoV-2 by FDA under an Emergency Use Authorization (EUA).  This EUA will remain in effect (meaning this test can be used) for the duration of the COVID-19 declaration under Section 564(b)(1) of the Act, 21 U.S.C. section 360bbb-3(b)(1), unless the authorization is terminated or revoked sooner.  Performed at [facility], [place] [address] [place], [place] [address]          Radiology Studies: CT Angio Chest PE W/Cm &/Or Wo Cm  Result Date: 10/06/2019 CLINICAL DATA:  Shortness of breath.  PE suspected. EXAM: CT ANGIOGRAPHY CHEST WITH CONTRAST TECHNIQUE: Multidetector CT imaging of the chest was performed using the standard protocol during bolus administration of intravenous contrast. Multiplanar CT image reconstructions and MIPs were obtained to evaluate the vascular anatomy. CONTRAST:  30mL OMNIPAQUE IOHEXOL 350 MG/ML SOLN COMPARISON:  CT angiogram chest dated 04/12/2018 FINDINGS: Cardiovascular: The most peripheral segmental and subsegmental pulmonary arteries bilaterally are difficult to characterize due to patient breathing motion artifact, however, there is no pulmonary embolism identified within the main, lobar or central segmental pulmonary arteries bilaterally. Heart size appears stable. No pericardial effusion. No thoracic aortic aneurysm or evidence of aortic dissection. Aortic atherosclerosis. Mediastinum/Nodes: No mass or enlarged lymph nodes are seen within the mediastinum or perihilar regions. Small hiatal hernia. Esophagus is unremarkable. Tracheostomy.  Lungs/Pleura: Mild bibasilar atelectasis. Lungs otherwise clear. No pleural effusion or pneumothorax. Upper Abdomen: Limited images of the upper abdomen are unremarkable. Musculoskeletal: No acute or suspicious osseous finding. Review of the MIP images confirms the above findings. IMPRESSION: 1. No acute findings. No pulmonary embolism seen, with study limitations detailed above. No pneumonia or pulmonary edema. 2. Small hiatal hernia. Aortic Atherosclerosis (ICD10-I70.0). Electronically Signed   By: Franki Cabot M.D.   On: 10/06/2019 14:50   DG Chest Portable 1 View  Result Date: 10/07/2019 CLINICAL DATA:  Shortness of breath.  Progressive hypoxia. EXAM: PORTABLE CHEST 1 VIEW COMPARISON:  One-view chest x-ray and CTA chest 10/06/2019 FINDINGS: The heart is enlarged scratched at the heart size is normal. Progressive pulmonary vascular congestion is present. Progressive bibasilar airspace opacities are noted. No pneumothorax is present. IMPRESSION: Progressive pulmonary  vascular congestion and bibasilar airspace disease, concerning for edema or infection. Electronically Signed   By: San Morelle M.D.   On: 10/07/2019 06:13   Portable chest 1 View  Result Date: 10/06/2019 CLINICAL DATA:  Worsening cough and shortness of breath EXAM: PORTABLE CHEST 1 VIEW COMPARISON:  04/12/2018 FINDINGS: Cardiac shadow is stable. Aortic calcifications are seen. Tortuosity of the aorta is seen. No focal infiltrate or sizable effusion is noted. Minimal basilar atelectasis is noted on the right. No bony abnormality is seen. IMPRESSION: Mild right basilar atelectasis. Electronically Signed   By: Inez Catalina M.D.   On: 10/06/2019 09:13        Scheduled Meds: . amLODipine  10 mg Oral Daily  . atorvastatin  80 mg Oral Daily  . azithromycin  500 mg Oral Daily  . enoxaparin (LOVENOX) injection  40 mg Subcutaneous QHS  . guaiFENesin-dextromethorphan  10 mL Oral Q6H  . ipratropium-albuterol  3 mL Nebulization TID   . methylPREDNISolone (SOLU-MEDROL) injection  60 mg Intravenous Q12H  . nystatin cream   Topical TID  . sodium chloride HYPERTONIC  4 mL Nebulization Daily   Continuous Infusions:   LOS: 1 day    Time spent: 35 mins.More than 50% of that time was spent in counseling and/or coordination of care.      Shelly Coss, MD [facility] P8/13/2021, 8:38 AM

## 2019-10-07 NOTE — Progress Notes (Signed)
°   10/07/19 2137  Assess: MEWS Score  Temp 98.4 F (36.9 C)  BP 118/73  Pulse Rate (!) 117  Resp 20  Level of Consciousness Alert  SpO2 94 %  O2 Device Tracheostomy Collar  O2 Flow Rate (L/min) 5 L/min  FiO2 (%) 28 %  Assess: MEWS Score  MEWS Temp 0  MEWS Systolic 0  MEWS Pulse 2  MEWS RR 0  MEWS LOC 0  MEWS Score 2  MEWS Score Color Yellow  Assess: if the MEWS score is Yellow or Red  Were vital signs taken at a resting state? Yes  Focused Assessment No change from prior assessment  Early Detection of Sepsis Score *See Row Information* Low  MEWS guidelines implemented *See Row Information* Yes  Treat  MEWS Interventions Administered scheduled meds/treatments  Pain Scale 0-10  Pain Score 2  Pain Type Chronic pain  Pain Location Hip  Pain Orientation Right  Pain Descriptors / Indicators Sore  Patients Stated Pain Goal 3  Pain Intervention(s) Medication (See eMAR) (wants cough medicine)  Notify: Charge Nurse/RN  Name of Charge Nurse/RN Notified Aletha Halim.  Date Charge Nurse/RN Notified 10/07/19  Time Charge Nurse/RN Notified 2138

## 2019-10-07 NOTE — Consult Note (Signed)
NAME:  Katelyn [name], MRN:  [id number], DOB:  1951-01-15, LOS: 1 ADMISSION DATE:  10/06/2019, CONSULTATION DATE: 8/12 REFERRING MD:  Tawanna Solo, CHIEF COMPLAINT:  Hypoxia    Brief History   69 year old female with complicated medical history including prior laryngectomy and radical neck from laryngeal cancer, prior smoker.  Diastolic heart dysfunction.  Admitted with approximate 2-3 day history of cough and shortness of breath.  Found to be hypoxic, and hypercarbic in the emergency room  History of present illness   69 year old female with history as mentioned below presented to the emergency room on 8/12 with chief complaint of shortness of breath and cough.  Had progressed over 2-3 day timeframe.  Was brought to the emergency room as shortness of breath worsened over the following 24 hours from onset.  She apparently has not seen a doctor for over a year.  She is a previous smoker.  She did have her first COVID-19 vaccine about 10 days prior to presentation.  In the emergency room she was found to be tachypneic, short of breath.  Required 5 L (28) of oxygen via trach collar to get pulse oximetry greater than 90%.  Arterial blood gas demonstrated hypercarbic respiratory failure.  A CT chest was obtained this was negative for pulmonary emboli no significant pulmonary edema or atelectasis.  She did have a very small hiatal hernia.  She is admitted with a working diagnosis of COPD exacerbation as a diagnosis of exclusion.  Pulmonary asked to evaluate to better define the etiology of her hypoxia and make further recommendations  Past Medical History   Prior laryngectomy and radical neck, secondary to laryngeal cancer, hypertension, hyperlipidemia, prior nonhemorrhagic CVA, right leg deep vein thrombosis, hip fracture, diastolic heart failure. Significant Hospital Events   Admitted 8/12  Consults:  Pulmonary consulted  Procedures:    Significant Diagnostic Tests:  CT chest 8/12: Negative for  pulmonary emboli, pulmonary edema, atelectasis, pneumonia or pleural effusions  Micro Data:  *Coronavirus 2: Negative. Antimicrobials:   azithromycin 8/12>>>  Interim history/subjective:   Objective   Blood pressure (Abnormal) 146/101, pulse 100, temperature 98.6 F (37 C), temperature source Oral, resp. rate 20, height 4\' 11"  (1.499 m), weight 61.3 kg, SpO2 96 %.    FiO2 (%):  [28 %-40 %] 28 %  No intake or output data in the 24 hours ending 10/07/19 1520 Filed Weights   10/06/19 2140 10/07/19 1402  Weight: 65.8 kg 61.3 kg    Examination: General: General: 70 year old female resting in bed currently in no acute distress HENT: Normocephalic atraumatic.  Her laryngectomy site is clean dry and intact Lungs: Faint expiratory wheeze primarily on the left, no accessory use currently 28% humidified oxygen Cardiovascular: Regular rate and rhythm Abdomen: Soft nontender Extremities: Warm dry Neuro: Awake oriented GU: Voids  Resolved Hospital Problem list     Assessment & Plan:   Acute hypoxic and hypercarbic respiratory failure  Purulent bronchitis  Bronchospasm  Mucous plugging  Possible COPD exacerbation  Laryngectomy in setting of prior radical neck for laryngeal cancer Deconditioning Diastolic heart failure currently compensated   acute hypoxic and hypercarbic respiratory failure in setting of purulent bronchitis, mucous plugging, +/- COPD exacerbation -unable to do PFTs. Was prior smoker. Does seem to improve w/ nebulized BDs -Initial pulse oximetry in the 70s on room air, did episode of mucous plugging requiring suctioning with subsequent improvement Plan/recommendation Complete 5 days of azithromycin Would continue to provide DuoNeb, she thinks this helps Steroid taper Most importantly I would send  her home with humidification.  I think mucous plugging is likely a large contributing factor at least of the hypoxia component.  The presence of hypercarbia would raise  concern for airflow limitations She may need supplemental oxygen at home   Best practice:  Per primary  Labs   CBC: Recent Labs  Lab 10/06/19 0919 10/07/19 0431  WBC 7.7 7.0  NEUTROABS 5.9  --   HGB 10.5* 12.9  HCT 33.8* 41.7  MCV 93.6 92.1  PLT 137* 401    Basic Metabolic Panel: Recent Labs  Lab 10/06/19 0919 10/07/19 0431  NA 141 139  K 3.4* 4.2  CL 108 105  CO2 25 24  GLUCOSE 89 157*  BUN 18 29*  CREATININE 0.84 1.16*  CALCIUM 8.4* 9.7   GFR: Estimated Creatinine Clearance: 35.9 mL/min (A) (by C-G formula based on SCr of 1.16 mg/dL (H)). Recent Labs  Lab 10/06/19 0919 10/07/19 0431  PROCALCITON <0.10  --   WBC 7.7 7.0    Liver Function Tests: Recent Labs  Lab 10/06/19 0919  AST 21  ALT 14  ALKPHOS 91  BILITOT 0.7  PROT 6.9  ALBUMIN 3.6   No results for input(s): LIPASE, AMYLASE in the last 168 hours. No results for input(s): AMMONIA in the last 168 hours.  ABG    Component Value Date/Time   PHART 7.205 (L) 10/06/2019 1438   PCO2ART 59.8 (H) 10/06/2019 1438   PO2ART 88.6 10/06/2019 1438   HCO3 22.8 10/06/2019 1438   TCO2 25 04/12/2018 1127   ACIDBASEDEF 5.8 (H) 10/06/2019 1438   O2SAT 93.2 10/06/2019 1438     Coagulation Profile: No results for input(s): INR, PROTIME in the last 168 hours.  Cardiac Enzymes: No results for input(s): CKTOTAL, CKMB, CKMBINDEX, TROPONINI in the last 168 hours.  HbA1C: Hgb A1c MFr Bld  Date/Time Value Ref Range Status  10/07/2019 04:13 AM 5.4 4.8 - 5.6 % Final    Comment:    (NOTE) Pre diabetes:          5.7%-6.4%  Diabetes:              >6.4%  Glycemic control for   <7.0% adults with diabetes   03/02/2011 06:02 AM 5.6 <5.7 % Final    Comment:    (NOTE)                                                                       According to the ADA Clinical Practice Recommendations for 2011, when HbA1c is used as a screening test:  >=6.5%   Diagnostic of Diabetes Mellitus           (if abnormal  result is confirmed) 5.7-6.4%   Increased risk of developing Diabetes Mellitus References:Diagnosis and Classification of Diabetes Mellitus,Diabetes UUVO,5366,44(IHKVQ 1):S62-S69 and Standards of Medical Care in         Diabetes - 2011,Diabetes QVZD,6387,56 (Suppl 1):S11-S61.    CBG: No results for input(s): GLUCAP in the last 168 hours.  Review of Systems:   Not able to phonate.  However denies chest pain, shortness of breath currently at rest.  Denies further coughing.  No fever.  Does have some wheezing.  Past Medical History  She,  has a past medical history  of Acute gallstone pancreatitis (01/08/2014), AKI (acute kidney injury) ([place]) (01/08/2014), Anemia, Blood transfusion, Cancer ([place]), Cholecystitis, H/O tracheostomy, Hip fracture ([place]), Hypercholesteremia, Hypertension, Right leg DVT ([place]) (05/08/2014), Septic shock - Kelbsiella (01/09/2014), Shortness of breath, and Stroke ([place]).   Surgical History    Past Surgical History:  Procedure Laterality Date  . ABDOMINAL HYSTERECTOMY    . DIRECT LARYNGOSCOPY N/A 06/08/2017   Procedure: DIRECT LARYNGOSCOPY WITH BIOPSY;  Surgeon: Helayne Seminole, MD;  Location: [facility] OR;  Service: ENT;  Laterality: N/A;  . ENDOSCOPIC RETROGRADE CHOLANGIOPANCREATOGRAPHY (ERCP) WITH PROPOFOL N/A 01/11/2014   Procedure: ENDOSCOPIC RETROGRADE CHOLANGIOPANCREATOGRAPHY (ERCP) WITH PROPOFOL;  Surgeon: Gatha Mayer, MD;  Location: [place];  Service: Endoscopy;  Laterality: N/A;  . ERCP    . ERCP N/A 05/26/2014   Procedure: ENDOSCOPIC RETROGRADE CHOLANGIOPANCREATOGRAPHY (ERCP);  Surgeon: Gatha Mayer, MD;  Location: [facility];  Service: Endoscopy;  Laterality: N/A;  . ESOPHAGOGASTRODUODENOSCOPY (EGD) WITH PROPOFOL N/A 12/07/2014   Procedure: ESOPHAGOGASTRODUODENOSCOPY (EGD) WITH PROPOFOL;  Surgeon: Milus Banister, MD;  Location: [facility] ENDOSCOPY;  Service: Endoscopy;  Laterality: N/A;  . EUS N/A 12/07/2014   Procedure: UPPER ENDOSCOPIC ULTRASOUND (EUS)  LINEAR;  Surgeon: Milus Banister, MD;  Location: [facility] ENDOSCOPY;  Service: Endoscopy;  Laterality: N/A;  . GASTROINTESTINAL STENT REMOVAL N/A 12/07/2014   Procedure: GASTROINTESTINAL STENT REMOVAL;  Surgeon: Milus Banister, MD;  Location: [facility] ENDOSCOPY;  Service: Endoscopy;  Laterality: N/A;  pancreatic stent removal  . HIP FRACTURE SURGERY    . INTRAMEDULLARY (IM) NAIL INTERTROCHANTERIC Right 05/29/2017   Procedure: INTRAMEDULLARY (IM) NAIL INTERTROCHANTRIC FEMUR;  Surgeon: Nicholes Stairs, MD;  Location: [place];  Service: Orthopedics;  Laterality: Right;  . PERCUTANEOUS TRACHEOSTOMY N/A 05/29/2017   Procedure: PERCUTANEOUS TRACHEOSTOMY;  Surgeon: Judeth Horn, MD;  Location: [place];  Service: General;  Laterality: N/A;  . TEE WITHOUT CARDIOVERSION  03/06/2011   Procedure: TRANSESOPHAGEAL ECHOCARDIOGRAM (TEE);  Surgeon: Jolaine Artist, MD;  Location: [facility] ENDOSCOPY;  Service: Cardiovascular;  Laterality: N/A;     Social History   reports that she quit smoking about 2 years ago. Her smoking use included cigarettes. She smoked 0.25 packs per day. She has never used smokeless tobacco. She reports that she does not drink alcohol and does not use drugs.   Family History   Her family history includes Breast cancer in her maternal aunt; Diabetes Mellitus I in her father and sister. There is no history of Anesthesia problems, Hypotension, Malignant hyperthermia, Pseudochol deficiency, Colon cancer, Esophageal cancer, Pancreatic cancer, Kidney disease, or Liver disease.   Allergies No Known Allergies   Home Medications  Prior to Admission medications   Medication Sig Start Date End Date Taking? Authorizing Provider  acetaminophen (TYLENOL) 160 MG/5ML liquid Take 15.6 mLs (500 mg total) by mouth every 6 (six) hours as needed for pain. Patient not taking: Reported on 10/06/2019 11/28/17   Zigmund Gottron, NP  albuterol (PROVENTIL HFA;VENTOLIN HFA) 108 (90 Base) MCG/ACT inhaler Inhale 2 puffs into the  lungs every 4 (four) hours as needed for wheezing or shortness of breath. Patient not taking: Reported on 10/06/2019 04/12/18   Noemi Chapel, MD  albuterol (PROVENTIL) (2.5 MG/3ML) 0.083% nebulizer solution Take 3 mLs (2.5 mg total) by nebulization every 6 (six) hours as needed for wheezing or shortness of breath. Patient not taking: Reported on 10/06/2019 04/12/18   Noemi Chapel, MD  nystatin cream (MYCOSTATIN) Apply to affected area 2 times daily Patient not taking: Reported on 10/06/2019 04/12/18  Noemi Chapel, MD     Critical care time:     Erick Colace ACNP-BC [place] Pager # [phone] OR # [phone] if no answer

## 2019-10-07 NOTE — ED Provider Notes (Signed)
Called to see patient who was admitted with COPD exacerbation.  She got acutely hypoxic to the 70s with heart rate to the 140s.  She has gasping for breath.  Diminished breath sounds bilaterally. Respiratory at bedside.  Patient has a laryngectomy but no trach in place and only a stoma.  Patient placed on nonrebreather.  Previous x-ray and CT scan were negative for acute pathology.  She was suctioned for a large clot and is moving better air.  Patient with increased work of breathing and improvement of heart rate to the 120s.  Repeat chest x-ray this time.  Wean back to FiO2 of 40%.  Hospitalist Dr. Clearence Ped updated.  CRITICAL CARE Performed by: Ezequiel Essex Total critical care time: 32 minutes Critical care time was exclusive of separately billable procedures and treating other patients. Critical care was necessary to treat or prevent imminent or life-threatening deterioration. Critical care was time spent personally by me on the following activities: development of treatment plan with patient and/or surrogate as well as nursing, discussions with consultants, evaluation of patient's response to treatment, examination of patient, obtaining history from patient or surrogate, ordering and performing treatments and interventions, ordering and review of laboratory studies, ordering and review of radiographic studies, pulse oximetry and re-evaluation of patient's condition.      Ezequiel Essex, MD 10/07/19 [phone]

## 2019-10-07 NOTE — Progress Notes (Signed)
Echocardiogram 2D Echocardiogram has been performed.  Oneal Deputy Ardeth Repetto 10/07/2019, 11:14 AM

## 2019-10-08 LAB — BASIC METABOLIC PANEL
Anion gap: 11 (ref 5–15)
BUN: 46 mg/dL — ABNORMAL HIGH (ref 8–23)
CO2: 23 mmol/L (ref 22–32)
Calcium: 9.3 mg/dL (ref 8.9–10.3)
Chloride: 100 mmol/L (ref 98–111)
Creatinine, Ser: 1.46 mg/dL — ABNORMAL HIGH (ref 0.44–1.00)
GFR calc Af Amer: 42 mL/min — ABNORMAL LOW (ref >60)
GFR calc non Af Amer: 36 mL/min — ABNORMAL LOW (ref >60)
Glucose, Bld: 125 mg/dL — ABNORMAL HIGH (ref 70–99)
Potassium: 3.9 mmol/L (ref 3.5–5.1)
Sodium: 134 mmol/L — ABNORMAL LOW (ref 135–145)

## 2019-10-08 MED ORDER — ALPRAZOLAM 0.5 MG PO TABS
0.5000 mg | ORAL_TABLET | Freq: Three times a day (TID) | ORAL | Status: DC | PRN
  Administered 2019-10-09 – 2019-10-17 (×8): 0.5 mg via ORAL
  Filled 2019-10-08 (×9): qty 1

## 2019-10-08 MED ORDER — ENOXAPARIN SODIUM 30 MG/0.3ML ~~LOC~~ SOLN
30.0000 mg | Freq: Every day | SUBCUTANEOUS | Status: DC
  Administered 2019-10-08: 30 mg via SUBCUTANEOUS
  Filled 2019-10-08: qty 0.3

## 2019-10-08 MED ORDER — IPRATROPIUM-ALBUTEROL 0.5-2.5 (3) MG/3ML IN SOLN
3.0000 mL | Freq: Two times a day (BID) | RESPIRATORY_TRACT | Status: DC
  Administered 2019-10-08 – 2019-10-09 (×2): 3 mL via RESPIRATORY_TRACT
  Filled 2019-10-08 (×2): qty 3

## 2019-10-08 NOTE — Progress Notes (Signed)
PROGRESS NOTE    Yarimar Lavis  KDX:[id number] DOB: 23-Oct-1950 DOA: 10/06/2019 PCP: Aretta Nip, MD   Brief Narrative: Mikeya Tomasetti is a 69 y.o. female with medical history significant of laryngeal cancer status post total laryngectomy status post neck dissection and  currently on a stoma, hypertension, hyperlipidemia, nonhemorrhagic CVA, right leg DVT, hip fracture, diastolic CHF who presents to the emergency department with complaint of shortness of breath and cough .  She was admitted for the management of possible COPD exacerbation.  Hospital course remarkable for persistent requirement of oxygen.PCCM also consulted and following.  Plan for VQ scanning.  Assessment & Plan:   Principal Problem:   Acute respiratory failure with hypoxia (HCC) Active Problems:   Hypercholesteremia   Essential hypertension   Cerebral infarction (HCC)   Diastolic dysfunction   Right leg DVT (HCC)   Generalized weakness   COPD exacerbation (HCC)     Acute respiratory failure with hypoxia and hypercarbia: Presented with severe shortness of breath, dry cough.  Most likely from COPD exacerbation/acute diastolic congestive heart failure/pulmonary hypertension.  Currently requiring 5 L of oxygen per minute.  COPD exacerbation: No documented history of COPD as per the chart.  Presented with dyspnea, cough. ABG showed hypercarbia.  Started on Solu-Medrol.  Also started on azithromycin.  She needs to follow-up with pulmonology as an outpatient for PFTs.  Continue bronchodilators.  Continue supplemental oxygen as needed.  Laryngeal cancer: Status post total laryngectomy, neck dissection.  Currently in remission.  She used  to follow-up with Dr. Vicie Mutters, ENT, at [facility].  Currently has a stoma only at her tracheostomy site.  We recommend to follow-up with ENT as an outpatient.  She is extremely noncompliant and has not taken any medications and has not seen a physician for a  year.  Elevated D-dimer/history of right lower extremity DVT: Currently not on any medications. Daughter unsure about the previous medication she was taking Venous Doppler of the bilateral lower extremities showed chronic  right lower extremity DVT. CT angiogram did not show any pulmonary embolism.  Since echocardiogram showed mildly elevated pulmonary artery pressure, VQ scan has been ordered to rule out chronic venous thromboembolism.  If its positive, will plan for starting anticoagulation.  Chronic diastolic congestive heart failure: Echo done on this admission showed ejection fraction of 66 5%, grade 1 diastolic dysfunction.  Does not take any diuretics at home. Chest x-ray done on 10/07/2019 showed pulmonary vascular congestion, bibasilar airspace disease consistent with edema or infection.    Given a dose of Lasix IV 40 mg once.  BNP is normal so diuretics not continued.  AKI: Creatinine slightly bumped up today.  Most likely secondary to Lasix that was given yesterday.  We will hold Lasix and continue to monitor BMP.  History of nonhemorrhagic stroke: Has mild right hemiparesis.  Does not take aspirin or Plavix at home.  Daughter states she was previously on aspirin which was stopped by her previous providers.  Hyperlipidemia: Previously taking cholesterol medications but not taking anything at present.  LDL more than 200.  Start on Lipitor 80 mg daily.  Hypertension: Noted to be hypertensive on presentation.  Not taking any medication at home.  Continue as needed medications.  Started on amlodipine.  Rash: Most likely fungal rash beneath her breasts.  Continue nystatin  Anxiety/panic attack: Continue Xanax as needed.  Generalized weakness: Has gait problem.  Ambulates minimally with the help of walker.  Will request a PT/OT evaluation.  DVT prophylaxis:Lovenox Code Status: Full Family Communication: Discussed with daughter at bedside on 10/06/2019.Called daughter on  phone today,call not received  Status is: Inpatient  Remains inpatient appropriate because:Hemodynamically unstable   Dispo: The patient is from: Home              Anticipated d/c is to: Home              Anticipated d/c date is: 2 days              Patient currently is not medically stable to d/c. Still requiring 5 days of oxygen per minute, VQ scan pending.    Consultants: PCCM  Procedures:None  Antimicrobials:  Anti-infectives (From admission, onward)   Start     Dose/Rate Route Frequency Ordered Stop   10/06/19 1930  azithromycin (ZITHROMAX) tablet 500 mg     Discontinue     500 mg Oral Daily 10/06/19 1807 10/09/19 0959      Subjective:  Patient seen and examined at the bedside this morning.  He still on 5 L of oxygen per minute.  She looks much better than when she came.  Complains of some panic attacks.  Eager to go home..  Objective: Vitals:   10/07/19 2320 10/07/19 2322 10/08/19 0341 10/08/19 0418  BP:  127/80 131/80   Pulse: (!) 115 (!) 112 88 95  Resp: 20  20   Temp:  98.3 F (36.8 C) 98.4 F (36.9 C)   TempSrc:  Oral    SpO2: 92% 96% 93% 95%  Weight:      Height:        Intake/Output Summary (Last 24 hours) at 10/08/2019 0738 Last data filed at 10/08/2019 0500 Gross per 24 hour  Intake 360 ml  Output 800 ml  Net -440 ml   Filed Weights   10/06/19 2140 10/07/19 1402  Weight: 65.8 kg 61.3 kg    Examination:  General exam: Deconditioned, debilitated HEENT: Tracheostomy,trach collar Respiratory system: Bilateral mildly diminished air sounds, no crackles or wheezes  cardiovascular system: S1 & S2 heard, RRR. No JVD, murmurs, rubs, gallops or clicks. Gastrointestinal system: Abdomen is nondistended, soft and nontender. No organomegaly or masses felt. Normal bowel sounds heard. Central nervous system: Alert and oriented. No focal neurological deficits. Extremities: No edema, no clubbing ,no cyanosis Skin: No rashes, lesions or ulcers,no icterus ,no  pallor   Data Reviewed: I have personally reviewed following labs and imaging studies  CBC: Recent Labs  Lab 10/06/19 0919 10/07/19 0431  WBC 7.7 7.0  NEUTROABS 5.9  --   HGB 10.5* 12.9  HCT 33.8* 41.7  MCV 93.6 92.1  PLT 137* 291   Basic Metabolic Panel: Recent Labs  Lab 10/06/19 0919 10/07/19 0431 10/08/19 0520  NA 141 139 134*  K 3.4* 4.2 3.9  CL 108 105 100  CO2 25 24 23   GLUCOSE 89 157* 125*  BUN 18 29* 46*  CREATININE 0.84 1.16* 1.46*  CALCIUM 8.4* 9.7 9.3   GFR: Estimated Creatinine Clearance: 28.5 mL/min (A) (by C-G formula based on SCr of 1.46 mg/dL (H)). Liver Function Tests: Recent Labs  Lab 10/06/19 0919  AST 21  ALT 14  ALKPHOS 91  BILITOT 0.7  PROT 6.9  ALBUMIN 3.6   No results for input(s): LIPASE, AMYLASE in the last 168 hours. No results for input(s): AMMONIA in the last 168 hours. Coagulation Profile: No results for input(s): INR, PROTIME in the last 168 hours. Cardiac Enzymes: No results for input(s): CKTOTAL,  CKMB, CKMBINDEX, TROPONINI in the last 168 hours. BNP (last 3 results) No results for input(s): PROBNP in the last 8760 hours. HbA1C: Recent Labs    10/07/19 0413  HGBA1C 5.4   CBG: No results for input(s): GLUCAP in the last 168 hours. Lipid Profile: Recent Labs    10/07/19 0431  CHOL 287*  HDL 53  LDLCALC 217*  TRIG 83  CHOLHDL 5.4   Thyroid Function Tests: No results for input(s): TSH, T4TOTAL, FREET4, T3FREE, THYROIDAB in the last 72 hours. Anemia Panel: Recent Labs    10/06/19 0919  FERRITIN 99   Sepsis Labs: Recent Labs  Lab 10/06/19 0919  PROCALCITON <0.10    Recent Results (from the past 240 hour(s))  SARS Coronavirus 2 by RT PCR (hospital order, performed in [facility] hospital lab) Nasopharyngeal Nasopharyngeal Swab     Status: None   Collection Time: 10/06/19  9:28 AM   Specimen: Nasopharyngeal Swab  Result Value Ref Range Status   SARS Coronavirus 2 NEGATIVE NEGATIVE Final    Comment:  (NOTE) SARS-CoV-2 target nucleic acids are NOT DETECTED.  The SARS-CoV-2 RNA is generally detectable in upper and lower respiratory specimens during the acute phase of infection. The lowest concentration of SARS-CoV-2 viral copies this assay can detect is 250 copies / mL. A negative result does not preclude SARS-CoV-2 infection and should not be used as the sole basis for treatment or other patient management decisions.  A negative result may occur with improper specimen collection / handling, submission of specimen other than nasopharyngeal swab, presence of viral mutation(s) within the areas targeted by this assay, and inadequate number of viral copies (<250 copies / mL). A negative result must be combined with clinical observations, patient history, and epidemiological information.  Fact Sheet for Patients:   StrictlyIdeas.no  Fact Sheet for Healthcare Providers: BankingDealers.co.za  This test is not yet approved or  cleared by the [place] FDA and has been authorized for detection and/or diagnosis of SARS-CoV-2 by FDA under an Emergency Use Authorization (EUA).  This EUA will remain in effect (meaning this test can be used) for the duration of the COVID-19 declaration under Section 564(b)(1) of the Act, 21 U.S.C. section 360bbb-3(b)(1), unless the authorization is terminated or revoked sooner.  Performed at [facility], [place] [address], [place], [place] [address]          Radiology Studies: CT Angio Chest PE W/Cm &/Or Wo Cm  Result Date: 10/06/2019 CLINICAL DATA:  Shortness of breath.  PE suspected. EXAM: CT ANGIOGRAPHY CHEST WITH CONTRAST TECHNIQUE: Multidetector CT imaging of the chest was performed using the standard protocol during bolus administration of intravenous contrast. Multiplanar CT image reconstructions and MIPs were obtained to evaluate the vascular anatomy. CONTRAST:  39mL OMNIPAQUE  IOHEXOL 350 MG/ML SOLN COMPARISON:  CT angiogram chest dated 04/12/2018 FINDINGS: Cardiovascular: The most peripheral segmental and subsegmental pulmonary arteries bilaterally are difficult to characterize due to patient breathing motion artifact, however, there is no pulmonary embolism identified within the main, lobar or central segmental pulmonary arteries bilaterally. Heart size appears stable. No pericardial effusion. No thoracic aortic aneurysm or evidence of aortic dissection. Aortic atherosclerosis. Mediastinum/Nodes: No mass or enlarged lymph nodes are seen within the mediastinum or perihilar regions. Small hiatal hernia. Esophagus is unremarkable. Tracheostomy. Lungs/Pleura: Mild bibasilar atelectasis. Lungs otherwise clear. No pleural effusion or pneumothorax. Upper Abdomen: Limited images of the upper abdomen are unremarkable. Musculoskeletal: No acute or suspicious osseous finding. Review of the MIP images confirms  the above findings. IMPRESSION: 1. No acute findings. No pulmonary embolism seen, with study limitations detailed above. No pneumonia or pulmonary edema. 2. Small hiatal hernia. Aortic Atherosclerosis (ICD10-I70.0). Electronically Signed   By: Franki Cabot M.D.   On: 10/06/2019 14:50   DG Chest Portable 1 View  Result Date: 10/07/2019 CLINICAL DATA:  Shortness of breath.  Progressive hypoxia. EXAM: PORTABLE CHEST 1 VIEW COMPARISON:  One-view chest x-ray and CTA chest 10/06/2019 FINDINGS: The heart is enlarged scratched at the heart size is normal. Progressive pulmonary vascular congestion is present. Progressive bibasilar airspace opacities are noted. No pneumothorax is present. IMPRESSION: Progressive pulmonary vascular congestion and bibasilar airspace disease, concerning for edema or infection. Electronically Signed   By: San Morelle M.D.   On: 10/07/2019 06:13   Portable chest 1 View  Result Date: 10/06/2019 CLINICAL DATA:  Worsening cough and shortness of breath EXAM:  PORTABLE CHEST 1 VIEW COMPARISON:  04/12/2018 FINDINGS: Cardiac shadow is stable. Aortic calcifications are seen. Tortuosity of the aorta is seen. No focal infiltrate or sizable effusion is noted. Minimal basilar atelectasis is noted on the right. No bony abnormality is seen. IMPRESSION: Mild right basilar atelectasis. Electronically Signed   By: Inez Catalina M.D.   On: 10/06/2019 09:13   ECHOCARDIOGRAM COMPLETE  Result Date: 10/07/2019    ECHOCARDIOGRAM REPORT   Patient Name:   TISHIE ALTMANN Date of Exam: 10/07/2019 Medical Rec #:  [id number]         Height:       59.0 in Accession #:    [id number]        Weight:       145.0 lb Date of Birth:  [birth year]         BSA:          1.609 m Patient Age:    40 years          BP:           111/83 mmHg Patient Gender: F                 HR:           102 bpm. Exam Location:  Inpatient Procedure: 2D Echo, Color Doppler and Cardiac Doppler Indications:    X54.00 Acute diastolic (congestive) heart failure  History:        Patient has prior history of Echocardiogram examinations, most                 recent 06/05/2017. COPD; Risk Factors:Hypertension and                 Dyslipidemia.  Sonographer:    Raquel Sarna Senior RDCS Referring Phys: [phone] Qusai Kem  Sonographer Comments: Very technically difficult due to patient body habitus. IMPRESSIONS  1. Left ventricular ejection fraction, by estimation, is 60 to 65%. The left ventricle has normal function. The left ventricle has no regional wall motion abnormalities. There is mild left ventricular hypertrophy of the basal-septal segment. Left ventricular diastolic parameters are consistent with Grade I diastolic dysfunction (impaired relaxation).  2. Right ventricular systolic function is normal. The right ventricular size is normal. There is mildly elevated pulmonary artery systolic pressure.  3. The mitral valve is normal in structure. No evidence of mitral valve regurgitation. No evidence of mitral stenosis.  4. The aortic  valve is normal in structure. Aortic valve regurgitation is mild. Mild aortic valve stenosis.  5. The inferior vena cava is normal in size with greater than 50% respiratory  variability, suggesting right atrial pressure of 3 mmHg. FINDINGS  Left Ventricle: Left ventricular ejection fraction, by estimation, is 60 to 65%. The left ventricle has normal function. The left ventricle has no regional wall motion abnormalities. The left ventricular internal cavity size was normal in size. There is  mild left ventricular hypertrophy of the basal-septal segment. Left ventricular diastolic parameters are consistent with Grade I diastolic dysfunction (impaired relaxation). Indeterminate filling pressures. Right Ventricle: The right ventricular size is normal. No increase in right ventricular wall thickness. Right ventricular systolic function is normal. There is mildly elevated pulmonary artery systolic pressure. The tricuspid regurgitant velocity is 2.66  m/s, and with an assumed right atrial pressure of 8 mmHg, the estimated right ventricular systolic pressure is 33.2 mmHg. Left Atrium: Left atrial size was normal in size. Right Atrium: Right atrial size was normal in size. Pericardium: There is no evidence of pericardial effusion. Mitral Valve: The mitral valve is normal in structure. Normal mobility of the mitral valve leaflets. No evidence of mitral valve regurgitation. No evidence of mitral valve stenosis. Tricuspid Valve: The tricuspid valve is normal in structure. Tricuspid valve regurgitation is not demonstrated. No evidence of tricuspid stenosis. Aortic Valve: The aortic valve is normal in structure. Aortic valve regurgitation is mild. Aortic regurgitation PHT measures 326 msec. Mild aortic stenosis is present. Aortic valve mean gradient measures 8.0 mmHg. Aortic valve peak gradient measures 16.6  mmHg. Aortic valve area, by VTI measures 1.37 cm. Pulmonic Valve: The pulmonic valve was normal in structure. Pulmonic  valve regurgitation is not visualized. No evidence of pulmonic stenosis. Aorta: The aortic root is normal in size and structure. Venous: The inferior vena cava is normal in size with greater than 50% respiratory variability, suggesting right atrial pressure of 3 mmHg. IAS/Shunts: No atrial level shunt detected by color flow Doppler.  LEFT VENTRICLE PLAX 2D LVIDd:         3.40 cm  Diastology LVIDs:         2.20 cm  LV e' lateral:   4.35 cm/s LV PW:         1.00 cm  LV E/e' lateral: 15.3 LV IVS:        1.40 cm  LV e' medial:    8.05 cm/s LVOT diam:     1.98 cm  LV E/e' medial:  8.2 LV SV:         41 LV SV Index:   25 LVOT Area:     3.08 cm  RIGHT VENTRICLE RV S prime:     8.59 cm/s LEFT ATRIUM             Index LA diam:        3.20 cm 1.99 cm/m LA Vol (A2C):   21.6 ml 13.43 ml/m LA Vol (A4C):   28.8 ml 17.90 ml/m LA Biplane Vol: 26.4 ml 16.41 ml/m  AORTIC VALVE AV Area (Vmax):    1.15 cm AV Area (Vmean):   1.29 cm AV Area (VTI):     1.37 cm AV Vmax:           203.85 cm/s AV Vmean:          129.820 cm/s AV VTI:            0.298 m AV Peak Grad:      16.6 mmHg AV Mean Grad:      8.0 mmHg LVOT Vmax:         76.03 cm/s LVOT Vmean:  54.469 cm/s LVOT VTI:          0.133 m LVOT/AV VTI ratio: 0.45 AI PHT:            326 msec  AORTA Ao Root diam: 3.20 cm Ao Asc diam:  2.90 cm MITRAL VALVE                TRICUSPID VALVE MV Area (PHT): 3.31 cm     TR Peak grad:   28.3 mmHg MV Decel Time: 229 msec     TR Vmax:        266.00 cm/s MV E velocity: 66.40 cm/s MV A velocity: 120.00 cm/s  SHUNTS MV E/A ratio:  0.55         Systemic VTI:  0.13 m                             Systemic Diam: 1.98 cm Dani Gobble Croitoru MD Electronically signed by Sanda Klein MD Signature Date/Time: 10/07/2019/1:39:36 PM    Final    VAS [place] LOWER EXTREMITY VENOUS (DVT)  Result Date: 10/07/2019  Lower Venous DVTStudy Limitations: Size and depth of vessels, patient inability to position. Comparison Study: Prior study 03-02-2014 was positive for  DVT in RT Performing Technologist: Darlin Coco  Examination Guidelines: A complete evaluation includes B-mode imaging, spectral Doppler, color Doppler, and power Doppler as needed of all accessible portions of each vessel. Bilateral testing is considered an integral part of a complete examination. Limited examinations for reoccurring indications may be performed as noted. The reflux portion of the exam is performed with the patient in reverse Trendelenburg.  +---------+---------------+---------+-----------+-----------------+------------+ RIGHT    CompressibilityPhasicitySpontaneityProperties       Thrombus                                                                  Aging        +---------+---------------+---------+-----------+-----------------+------------+ CFV      Full           Yes      Yes                                      +---------+---------------+---------+-----------+-----------------+------------+ SFJ      Full                                                             +---------+---------------+---------+-----------+-----------------+------------+ FV Prox  Full                                                             +---------+---------------+---------+-----------+-----------------+------------+ FV Mid   None  Chronic      +---------+---------------+---------+-----------+-----------------+------------+ FV DistalNone                                                Chronic      +---------+---------------+---------+-----------+-----------------+------------+ PFV      Full                                                             +---------+---------------+---------+-----------+-----------------+------------+ POP      None                               partially        Chronic                                                  re-cannalized                  +---------+---------------+---------+-----------+-----------------+------------+ PTV      Full                                                             +---------+---------------+---------+-----------+-----------------+------------+ PERO     Full                                                             +---------+---------------+---------+-----------+-----------------+------------+   +---------+---------------+---------+-----------+----------+--------------+ LEFT     CompressibilityPhasicitySpontaneityPropertiesThrombus Aging +---------+---------------+---------+-----------+----------+--------------+ CFV      Full           Yes      Yes                                 +---------+---------------+---------+-----------+----------+--------------+ SFJ      Full                                                        +---------+---------------+---------+-----------+----------+--------------+ FV Prox  Full                                                        +---------+---------------+---------+-----------+----------+--------------+ FV Mid   Full                                                        +---------+---------------+---------+-----------+----------+--------------+  FV DistalFull                                                        +---------+---------------+---------+-----------+----------+--------------+ PFV      Full                                                        +---------+---------------+---------+-----------+----------+--------------+ POP      Full           Yes      Yes                                 +---------+---------------+---------+-----------+----------+--------------+ PTV      Full                                                        +---------+---------------+---------+-----------+----------+--------------+ PERO     Full                                                         +---------+---------------+---------+-----------+----------+--------------+     Summary: RIGHT: - Findings consistent with chronic deep vein thrombosis involving the right femoral vein, and right popliteal vein. - No cystic structure found in the popliteal fossa.  LEFT: - There is no evidence of deep vein thrombosis in the lower extremity.  - No cystic structure found in the popliteal fossa.  *See table(s) above for measurements and observations.    Preliminary         Scheduled Meds: . amLODipine  10 mg Oral Daily  . atorvastatin  80 mg Oral Daily  . azithromycin  500 mg Oral Daily  . enoxaparin (LOVENOX) injection  40 mg Subcutaneous QHS  . guaiFENesin-dextromethorphan  10 mL Oral Q6H  . ipratropium-albuterol  3 mL Nebulization TID  . mouth rinse  15 mL Mouth Rinse BID  . methylPREDNISolone (SOLU-MEDROL) injection  60 mg Intravenous Q12H  . nystatin cream   Topical TID  . sodium chloride HYPERTONIC  4 mL Nebulization Daily   Continuous Infusions:   LOS: 2 days    Time spent: 35 mins.More than 50% of that time was spent in counseling and/or coordination of care.      Shelly Coss, MD [facility] P8/14/2021, 7:38 AM

## 2019-10-08 NOTE — Progress Notes (Signed)
The patient has depends and would like [place] to place them on her. Going to the room shortly.

## 2019-10-08 NOTE — Progress Notes (Signed)
The patient's daughter phoned the RN. RN gave an update on the patient.

## 2019-10-09 ENCOUNTER — Inpatient Hospital Stay (HOSPITAL_COMMUNITY): Payer: Medicare Other

## 2019-10-09 LAB — BASIC METABOLIC PANEL
Anion gap: 11 (ref 5–15)
BUN: 44 mg/dL — ABNORMAL HIGH (ref 8–23)
CO2: 25 mmol/L (ref 22–32)
Calcium: 9.4 mg/dL (ref 8.9–10.3)
Chloride: 103 mmol/L (ref 98–111)
Creatinine, Ser: 1.24 mg/dL — ABNORMAL HIGH (ref 0.44–1.00)
GFR calc Af Amer: 51 mL/min — ABNORMAL LOW (ref >60)
GFR calc non Af Amer: 44 mL/min — ABNORMAL LOW (ref >60)
Glucose, Bld: 114 mg/dL — ABNORMAL HIGH (ref 70–99)
Potassium: 4.2 mmol/L (ref 3.5–5.1)
Sodium: 139 mmol/L (ref 135–145)

## 2019-10-09 MED ORDER — TECHNETIUM TO 99M ALBUMIN AGGREGATED
4.2000 | Freq: Once | INTRAVENOUS | Status: AC | PRN
  Administered 2019-10-09: 4.2 via INTRAVENOUS

## 2019-10-09 MED ORDER — IPRATROPIUM-ALBUTEROL 0.5-2.5 (3) MG/3ML IN SOLN
3.0000 mL | Freq: Two times a day (BID) | RESPIRATORY_TRACT | Status: DC
  Administered 2019-10-10 – 2019-10-18 (×17): 3 mL via RESPIRATORY_TRACT
  Filled 2019-10-09 (×17): qty 3

## 2019-10-09 MED ORDER — PREDNISONE 20 MG PO TABS
40.0000 mg | ORAL_TABLET | Freq: Every day | ORAL | Status: AC
  Administered 2019-10-10 – 2019-10-14 (×5): 40 mg via ORAL
  Filled 2019-10-09 (×5): qty 2

## 2019-10-09 MED ORDER — ENOXAPARIN SODIUM 40 MG/0.4ML ~~LOC~~ SOLN
40.0000 mg | Freq: Every day | SUBCUTANEOUS | Status: DC
  Administered 2019-10-09: 40 mg via SUBCUTANEOUS
  Filled 2019-10-09: qty 0.4

## 2019-10-09 MED ORDER — METOPROLOL TARTRATE 25 MG PO TABS
25.0000 mg | ORAL_TABLET | Freq: Two times a day (BID) | ORAL | Status: DC
  Administered 2019-10-09 – 2019-10-18 (×18): 25 mg via ORAL
  Filled 2019-10-09 (×19): qty 1

## 2019-10-09 MED ORDER — IPRATROPIUM-ALBUTEROL 0.5-2.5 (3) MG/3ML IN SOLN: 3.0000 mL | Freq: Every day | RESPIRATORY_TRACT | Status: DC

## 2019-10-09 NOTE — Progress Notes (Addendum)
Patient currently on 11L O2 via trach collar O2 sat 84-88% Respiratory came earlier in the night and performed a deep suction.   Patient coughing and states she is having a difficult time breathing. RN notified Respiratory Therapist and Jeannette Corpus, NP of the above information.

## 2019-10-09 NOTE — Progress Notes (Signed)
Patient currently in V/Q scan.

## 2019-10-09 NOTE — Evaluation (Addendum)
Physical Therapy Evaluation Patient Details Name: Katelyn [name] MRN: [id number] DOB: 02-Sep-1950 Today's Date: 10/09/2019   SATURATION QUALIFICATIONS: (This note is used to comply with regulatory documentation for home oxygen)  Patient Saturations on Room Air at Rest = 85%  Patient Saturations on [employer] while Ambulating = n/a  Patient Saturations on 3 Liters of oxygen while Ambulating = 88%    History of Present Illness  69 yo female admitted with acute resp failure. Hx of throat ca, neuropathy, CHF, CVA, CKD, DVT, R hip fx, trach 2019  Clinical Impression  On eval, pt required Min-Mod  Assist for mobility. She was able to stand and perform a stand pivot, bed to recliner, using RW. Increased time for pt to complete all tasks. Some encouragement required for participation. Discussed d/c plan with pt and daughter-pt plans to return home where she lives with her boyfriend. Will follow and progress activity as tolerated.     Follow Up Recommendations Home health PT;Supervision/Assistance - 24 hour    Equipment Recommendations  None recommended by PT    Recommendations for Other Services       Precautions / Restrictions        Mobility  Bed Mobility Overal bed mobility: Needs Assistance Bed Mobility: Supine to Sit     Supine to sit: Mod assist;HOB elevated     General bed mobility comments: Assist to scoot to EOB. Increased time.  Transfers Overall transfer level: Needs assistance Equipment used: Rolling walker (2 wheeled) Transfers: Sit to/from [facility] Sit to Stand: Min assist Stand pivot transfers: Min assist       General transfer comment: Assist to rise, stabilize. Stand pivot, bed to recliner using RW. Increased time. Pt with flexed posture and shuffle steps. Gait appeared antalgic but pt did not report pain.  Ambulation/Gait             General Gait Details: NT-weak and unsteady.  Stairs            Wheelchair Mobility     Modified Rankin (Stroke Patients Only)       Balance Overall balance assessment: Needs assistance         Standing balance support: Bilateral upper extremity supported Standing balance-Leahy Scale: Poor                               Pertinent Vitals/Pain Pain Assessment: No/denies pain    Home Living Family/patient expects to be discharged to:: Private residence Living Arrangements: Non-relatives/Friends ("boyfriend") Available Help at Discharge: Family;Friend(s) Type of Home: House Home Access: Ramped entrance     Home Layout: One level Home Equipment: Clinical cytogeneticist - 4 wheels;Cane - single point;Toilet riser      Prior Function Level of Independence: Independent with assistive device(s)         Comments: ambulatory with rollator around home     Hand Dominance        Extremity/Trunk Assessment   Upper Extremity Assessment Upper Extremity Assessment: Defer to OT evaluation    Lower Extremity Assessment Lower Extremity Assessment: Generalized weakness    Cervical / Trunk Assessment Cervical / Trunk Assessment: Kyphotic  Communication   Communication: Tracheostomy  Cognition Arousal/Alertness: Awake/alert Behavior During Therapy: WFL for tasks assessed/performed Overall Cognitive Status: Difficult to assess  General Comments      Exercises     Assessment/Plan    PT Assessment Patient needs continued PT services  PT Problem List Decreased strength;Decreased mobility;Decreased activity tolerance;Decreased balance       PT Treatment Interventions DME instruction;Gait training;Therapeutic activities;Therapeutic exercise;Functional mobility training;Balance training    PT Goals (Current goals can be found in the Care Plan section)  Acute Rehab PT Goals Patient Stated Goal: home PT Goal Formulation: With patient/family Time For Goal Achievement: 10/23/19 Potential to  Achieve Goals: Fair    Frequency Min 3X/week   Barriers to discharge        Co-evaluation               AM-PAC PT "6 Clicks" Mobility  Outcome Measure Help needed turning from your back to your side while in a flat bed without using bedrails?: A Little Help needed moving from lying on your back to sitting on the side of a flat bed without using bedrails?: A Little Help needed moving to and from a bed to a chair (including a wheelchair)?: A Little Help needed standing up from a chair using your arms (e.g., wheelchair or bedside chair)?: A Little Help needed to walk in hospital room?: A Lot Help needed climbing 3-5 steps with a railing? : A Lot 6 Click Score: 16    End of Session Equipment Utilized During Treatment: Oxygen Activity Tolerance: Patient tolerated treatment well Patient left: in chair;with call bell/phone within reach;with chair alarm set Nurse Communication: spoke with RN about reconnecting pt to trach O2 setup at end of PT session-RN agreeable to check on pt PT Visit Diagnosis: Muscle weakness (generalized) (M62.81);Unsteadiness on feet (R26.81)    Time: 9675-9163 PT Time Calculation (min) (ACUTE ONLY): 50 min   Charges:   PT Evaluation $PT Eval Low Complexity: 1 Low PT Treatments $Gait Training: 23-37 mins        Doreatha Massed, PT Acute Rehabilitation  Office: [phone] Pager: [phone]

## 2019-10-09 NOTE — Progress Notes (Signed)
°   10/09/19 2203  Oxygen Therapy  SpO2 (!) 89 %  O2 Device Tracheostomy Collar  O2 Flow Rate (L/min) 11 L/min  Patient Activity (if Appropriate) In bed  Pulse Oximetry Type Continuous  Oximetry Probe Site Changed Yes   RN notified Respiratory Therapist, RT will come deep suction the patient. Jeannette Corpus, NP notified.

## 2019-10-09 NOTE — Evaluation (Signed)
Occupational Therapy Evaluation Patient Details Name: Katelyn [name] MRN: [id number] DOB: 1950-12-11 Today's Date: 10/09/2019    History of Present Illness 69 yo female admitted with acute resp failure. Hx of throat ca, neuropathy, CHF, CVA, CKD, DVT, R hip fx, trach 2019   Clinical Impression   Katelyn [name] is a 69 year old woman admitted with acute respiratory failure who presents with generalized weakness, decreased activity tolerance and impaired balance resulting in decreased ability to perform baseline mobility and ADLs. Patient min assist with RW to stand and pivot back to bed and max assist for sit to supine. Patient requiring mod - max assist for LB ADLs and toileting and patient's activity tolerance limited to transfer to bed and return to supine. Patient unable to donn socks in chair - limited by persistent coughing. Patient with trach at 5LPM and 28% Fio2. Patient's sats dropped to 85% with transfer back to bed but returned to 94%. Patient will benefit from skilled OT services to improve deficits and learn compensatory strategies as needed in order to return to PLOF. Patient fairly adamant she is going home at discharge though she has had a significant decline in function. Will recommend [facility] OT and 24/7 assistance at discharge. If patient does not improve short term rehab may be needed.    Follow Up Recommendations  Home health OT;Supervision/Assistance - 24 hour ; 24/7 assistance    Equipment Recommendations  None recommended by OT    Recommendations for Other Services       Precautions / Restrictions Precautions Precautions: Fall Restrictions Weight Bearing Restrictions: No      Mobility Bed Mobility Overal bed mobility: Needs Assistance Bed Mobility: Sit to Supine     Supine to sit: Mod assist;HOB elevated Sit to supine: Max assist   General bed mobility comments: Patient max assist for lower extremitie and trunk negotiation to return to  supine.  Transfers Overall transfer level: Needs assistance Equipment used: Rolling walker (2 wheeled) Transfers: Sit to/from [facility] Sit to Stand: Min assist Stand pivot transfers: Min assist       General transfer comment: Patient min assist to stand from recliner with use of RW. Patient able to perform small steps to pivot to side of bed with bent over posture and therapist assisting with walker management.    Balance Overall balance assessment: Needs assistance Sitting-balance support: Bilateral upper extremity supported Sitting balance-Leahy Scale: Poor Sitting balance - Comments: Required propping with upper extremities at side of bed.   Standing balance support: Bilateral upper extremity supported Standing balance-Leahy Scale: Poor                             ADL either performed or assessed with clinical judgement   ADL Overall ADL's : Needs assistance/impaired Eating/Feeding: Independent;Sitting   Grooming: Sitting;Wash/dry hands;Wash/dry face   Upper Body Bathing: Set up;Sitting   Lower Body Bathing: Moderate assistance;Sit to/from stand   Upper Body Dressing : Set up;Sitting   Lower Body Dressing: Maximal assistance;Sit to/from stand   Toilet Transfer: Minimal assistance;Stand-pivot;BSC;RW   Toileting- Clothing Manipulation and Hygiene: Maximal assistance;Sit to/from stand       Functional mobility during ADLs: Minimal assistance;Rolling walker       Vision   Vision Assessment?: No apparent visual deficits     Perception     Praxis      Pertinent Vitals/Pain Pain Assessment: No/denies pain     Hand Dominance Right  Extremity/Trunk Assessment Upper Extremity Assessment Upper Extremity Assessment: Generalized weakness   Lower Extremity Assessment Lower Extremity Assessment: Defer to PT evaluation   Cervical / Trunk Assessment Cervical / Trunk Assessment: Kyphotic   Communication  Communication Communication: Tracheostomy   Cognition Arousal/Alertness: Awake/alert Behavior During Therapy: WFL for tasks assessed/performed Overall Cognitive Status: Difficult to assess                                     General Comments       Exercises     Shoulder Instructions      Home Living Family/patient expects to be discharged to:: Private residence Living Arrangements: Non-relatives/Friends ("boyfriend") Available Help at Discharge: Family;Friend(s) Type of Home: House Home Access: Ramped entrance     Home Layout: One level     Bathroom Shower/Tub: Occupational psychologist: Standard Bathroom Accessibility: Yes   Home Equipment: Clinical cytogeneticist - 4 wheels;Cane - single point;Toilet riser          Prior Functioning/Environment Level of Independence: Independent with assistive device(s)        Comments: ambulatory with rollator around home        OT Problem List: Decreased activity tolerance;Impaired balance (sitting and/or standing);Decreased strength;Decreased safety awareness;Decreased knowledge of use of DME or AE      OT Treatment/Interventions: Self-care/ADL training;Therapeutic exercise;DME and/or AE instruction;Therapeutic activities;Balance training;Patient/family education    OT Goals(Current goals can be found in the care plan section) Acute Rehab OT Goals Patient Stated Goal: To go home OT Goal Formulation: With patient Time For Goal Achievement: 10/23/19 Potential to Achieve Goals: Good  OT Frequency: Min 2X/week   Barriers to D/C:            Co-evaluation              AM-PAC OT "6 Clicks" Daily Activity     Outcome Measure Help from another person eating meals?: None Help from another person taking care of personal grooming?: A Little Help from another person toileting, which includes using toliet, bedpan, or urinal?: A Lot Help from another person bathing (including washing, rinsing,  drying)?: A Lot Help from another person to put on and taking off regular upper body clothing?: A Little Help from another person to put on and taking off regular lower body clothing?: A Lot 6 Click Score: 16   End of Session Equipment Utilized During Treatment: Oxygen;Rolling walker Nurse Communication: Mobility status  Activity Tolerance: Patient limited by fatigue Patient left: in bed;with call bell/phone within reach;with bed alarm set  OT Visit Diagnosis: Unsteadiness on feet (R26.81);Muscle weakness (generalized) (M62.81)                Time: 6433-2951 OT Time Calculation (min): 10 min Charges:  OT General Charges $OT Visit: 1 Visit OT Evaluation $OT Eval Moderate Complexity: 1 Mod  Lan Entsminger, OTR/L [facility]  Office [phone] Pager: [place] 10/09/2019, 5:06 PM

## 2019-10-09 NOTE — Progress Notes (Signed)
Patient continues to have periods of tachycardia with HR 128 at midnight. Trach-collar 28% to humidified air in place. Has periods of coughing spells. Sats remain in the high 90's but pt was placed on a continuous pulse OX by respiratory therapy. Patient in no obvious distress but will continue to monitor for any further changes.

## 2019-10-09 NOTE — Progress Notes (Signed)
SATURATION QUALIFICATIONS: (This note is used to comply with regulatory documentation for home oxygen)  Patient Saturations on Room Air at Rest = 85%  Patient Saturations on Room Air while Ambulating = N/A - patient unable to walk many steps, pivoted to chair  Patient Saturations on 2 Liters of oxygen while Ambulating = 88%  Please briefly explain why patient needs home oxygen: patient saturations decreased to 85% on RA while at rest, unable to walk for more accurate reading on ambulation, but while on 2L O2 via trach collar, saturations increased to 88% while exerting

## 2019-10-09 NOTE — Progress Notes (Signed)
PROGRESS NOTE    Katelyn [name]  WUJ:[id number] DOB: 03-25-1950 DOA: 10/06/2019 PCP: Aretta Nip, MD   Brief Narrative: Katelyn [name] is a 69 y.o. female with medical history significant of laryngeal cancer status post total laryngectomy status post neck dissection and  currently on a stoma, hypertension, hyperlipidemia, nonhemorrhagic CVA, right leg DVT, hip fracture, diastolic CHF who presents to the emergency department with complaint of shortness of breath and cough .  She was admitted for the management of possible COPD exacerbation.  Hospital course remarkable for persistent requirement of oxygen.PCCM also consulted and following.  Plan for VQ scanning.  Assessment & Plan:   Principal Problem:   Acute respiratory failure with hypoxia (HCC) Active Problems:   Hypercholesteremia   Essential hypertension   Cerebral infarction (HCC)   Diastolic dysfunction   Right leg DVT (HCC)   Generalized weakness   COPD exacerbation (HCC)     Acute respiratory failure with hypoxia and hypercarbia: Presented with severe shortness of breath, dry cough.  Most likely from COPD exacerbation/mucus plugging of the stoma.  Currently requiring 5 L of oxygen per minute.  We will try to wean the oxygen.  She needs humidification at home to prevent recurrent plugging.  COPD exacerbation: No documented history of COPD as per the chart.  Presented with dyspnea, cough. ABG showed hypercarbia.  Started on Solu-Medrol,now changed to prednisone. Completed course of  azithromycin.  She needs to follow-up with pulmonology as an outpatient for PFTs.  Continue bronchodilators.  Continue supplemental oxygen as needed.  Laryngeal cancer: Status post total laryngectomy, neck dissection.  Currently in remission.  She used  to follow-up with Dr. Vicie Mutters, ENT, at [facility].  Currently has a stoma only at her tracheostomy site.  We recommend to follow-up with ENT as an outpatient.  She is extremely  noncompliant and has not taken any medications and has not seen a physician for a year.  Elevated D-dimer/history of right lower extremity DVT: Currently not on any medications. Daughter unsure about the previous medication she was taking Venous Doppler of the bilateral lower extremities showed chronic  right lower extremity DVT. CT angiogram did not show any pulmonary embolism.  Since echocardiogram showed mildly elevated pulmonary artery pressure, VQ scan has been ordered to rule out chronic venous thromboembolism.  If its positive, will plan for starting anticoagulation.  Chronic diastolic congestive heart failure: Echo done on this admission showed ejection fraction of 66 5%, grade 1 diastolic dysfunction.  Does not take any diuretics at home. Chest x-ray done on 10/07/2019 showed pulmonary vascular congestion, bibasilar airspace disease consistent with edema or infection.    Given a dose of Lasix IV 40 mg once.  BNP is normal so diuretics not continued.  NFA:[id number]..  Most likely secondary to Lasix that was given once.  We will hold Lasix and continue to monitor BMP.  History of nonhemorrhagic stroke: Has mild right hemiparesis.  Does not take aspirin or Plavix at home.  Daughter states she was previously on aspirin which was stopped by her previous providers.  Hyperlipidemia: Previously taking cholesterol medications but not taking anything at present.  LDL more than 200.  Started on Lipitor 80 mg daily.  Hypertension: Noted to be hypertensive on presentation.  Not taking any medication at home.  Continue as needed medications.  Started on amlodipine.  Rash: Most likely fungal rash beneath her breasts.  Continue nystatin  Anxiety/panic attack: Continue Xanax as needed.  Generalized weakness: Has gait problem.  Ambulates  minimally with the help of walker.  We have requested  a PT/OT evaluation.       DVT prophylaxis:Lovenox Code Status: Full Family Communication: Discussed  with daughter at bedside on 10/06/2019  Status is: Inpatient  Remains inpatient appropriate because:Hemodynamically unstable   Dispo: The patient is from: Home              Anticipated d/c is to: Home              Anticipated d/c date is: tomorrow              Patient currently is not medically stable to d/c. Still requiring 5 L of oxygen per minute, VQ scan pending.    Consultants: PCCM  Procedures:None  Antimicrobials:  Anti-infectives (From admission, onward)   Start     Dose/Rate Route Frequency Ordered Stop   10/06/19 1930  azithromycin (ZITHROMAX) tablet 500 mg        500 mg Oral Daily 10/06/19 1807 10/08/19 1105      Subjective:  Patient seen and examined the bedside this morning.  Hemodynamically stable.  Still on 5 L of oxygen per minute.  Still having some coughing spells but she feels better and she is eager to go home.  Objective: Vitals:   10/08/19 2310 10/09/19 0129 10/09/19 0158 10/09/19 0515  BP: 138/81 (!) 148/96  (!) 174/100  Pulse: (!) 105 (!) 105  (!) 113  Resp: (!) 24 (!) 22  (!) 22  Temp: 98 F (36.7 C) 98 F (36.7 C)  98.4 F (36.9 C)  TempSrc: Oral   Oral  SpO2: 97% 97% 96% 96%  Weight:      Height:       No intake or output data in the 24 hours ending 10/09/19 0820 Filed Weights   10/06/19 2140 10/07/19 1402  Weight: 65.8 kg 61.3 kg    Examination:  General exam: Deconditioned, debilitated HEENT: Tracheostomy collar Respiratory system: Bilateral diminished air sounds but no wheezes or crackles  cardiovascular system: S1 & S2 heard, RRR. No JVD, murmurs, rubs, gallops or clicks. Gastrointestinal system: Abdomen is nondistended, soft and nontender. No organomegaly or masses felt. Normal bowel sounds heard. Central nervous system: Alert and oriented. No focal neurological deficits. Extremities: No edema, no clubbing ,no cyanosis Skin: No rashes, lesions or ulcers,no icterus ,no pallor   Data Reviewed: I have personally reviewed  following labs and imaging studies  CBC: Recent Labs  Lab 10/06/19 0919 10/07/19 0431  WBC 7.7 7.0  NEUTROABS 5.9  --   HGB 10.5* 12.9  HCT 33.8* 41.7  MCV 93.6 92.1  PLT 137* 962   Basic Metabolic Panel: Recent Labs  Lab 10/06/19 0919 10/07/19 0431 10/08/19 0520 10/09/19 0523  NA 141 139 134* 139  K 3.4* 4.2 3.9 4.2  CL 108 105 100 103  CO2 25 24 23 25   GLUCOSE 89 157* 125* 114*  BUN 18 29* 46* 44*  CREATININE 0.84 1.16* 1.46* 1.24*  CALCIUM 8.4* 9.7 9.3 9.4   GFR: Estimated Creatinine Clearance: 33.6 mL/min (A) (by C-G formula based on SCr of 1.24 mg/dL (H)). Liver Function Tests: Recent Labs  Lab 10/06/19 0919  AST 21  ALT 14  ALKPHOS 91  BILITOT 0.7  PROT 6.9  ALBUMIN 3.6   No results for input(s): LIPASE, AMYLASE in the last 168 hours. No results for input(s): AMMONIA in the last 168 hours. Coagulation Profile: No results for input(s): INR, PROTIME in the last 168 hours.  Cardiac Enzymes: No results for input(s): CKTOTAL, CKMB, CKMBINDEX, TROPONINI in the last 168 hours. BNP (last 3 results) No results for input(s): PROBNP in the last 8760 hours. HbA1C: Recent Labs    10/07/19 0413  HGBA1C 5.4   CBG: No results for input(s): GLUCAP in the last 168 hours. Lipid Profile: Recent Labs    10/07/19 0431  CHOL 287*  HDL 53  LDLCALC 217*  TRIG 83  CHOLHDL 5.4   Thyroid Function Tests: No results for input(s): TSH, T4TOTAL, FREET4, T3FREE, THYROIDAB in the last 72 hours. Anemia Panel: Recent Labs    10/06/19 0919  FERRITIN 99   Sepsis Labs: Recent Labs  Lab 10/06/19 0919  PROCALCITON <0.10    Recent Results (from the past 240 hour(s))  SARS Coronavirus 2 by RT PCR (hospital order, performed in [facility] hospital lab) Nasopharyngeal Nasopharyngeal Swab     Status: None   Collection Time: 10/06/19  9:28 AM   Specimen: Nasopharyngeal Swab  Result Value Ref Range Status   SARS Coronavirus 2 NEGATIVE NEGATIVE Final    Comment:  (NOTE) SARS-CoV-2 target nucleic acids are NOT DETECTED.  The SARS-CoV-2 RNA is generally detectable in upper and lower respiratory specimens during the acute phase of infection. The lowest concentration of SARS-CoV-2 viral copies this assay can detect is 250 copies / mL. A negative result does not preclude SARS-CoV-2 infection and should not be used as the sole basis for treatment or other patient management decisions.  A negative result may occur with improper specimen collection / handling, submission of specimen other than nasopharyngeal swab, presence of viral mutation(s) within the areas targeted by this assay, and inadequate number of viral copies (<250 copies / mL). A negative result must be combined with clinical observations, patient history, and epidemiological information.  Fact Sheet for Patients:   StrictlyIdeas.no  Fact Sheet for Healthcare Providers: BankingDealers.co.za  This test is not yet approved or  cleared by the [place] FDA and has been authorized for detection and/or diagnosis of SARS-CoV-2 by FDA under an Emergency Use Authorization (EUA).  This EUA will remain in effect (meaning this test can be used) for the duration of the COVID-19 declaration under Section 564(b)(1) of the Act, 21 U.S.C. section 360bbb-3(b)(1), unless the authorization is terminated or revoked sooner.  Performed at [facility], [place] [address] [place], [place] [address]          Radiology Studies: ECHOCARDIOGRAM COMPLETE  Result Date: 10/07/2019    ECHOCARDIOGRAM REPORT   Patient Name:   Katelyn [name] Date of Exam: 10/07/2019 Medical Rec #:  [id number]         Height:       59.0 in Accession #:    [id number]        Weight:       145.0 lb Date of Birth:  October 20, 1950         BSA:          1.609 m Patient Age:    24 years          BP:           111/83 mmHg Patient Gender: F                 HR:           102  bpm. Exam Location:  Inpatient Procedure: 2D Echo, Color Doppler and Cardiac Doppler Indications:    E32.12 Acute diastolic (congestive) heart failure  History:  Patient has prior history of Echocardiogram examinations, most                 recent 06/05/2017. COPD; Risk Factors:Hypertension and                 Dyslipidemia.  Sonographer:    Raquel Sarna Senior RDCS Referring Phys: [phone] Albie Bazin  Sonographer Comments: Very technically difficult due to patient body habitus. IMPRESSIONS  1. Left ventricular ejection fraction, by estimation, is 60 to 65%. The left ventricle has normal function. The left ventricle has no regional wall motion abnormalities. There is mild left ventricular hypertrophy of the basal-septal segment. Left ventricular diastolic parameters are consistent with Grade I diastolic dysfunction (impaired relaxation).  2. Right ventricular systolic function is normal. The right ventricular size is normal. There is mildly elevated pulmonary artery systolic pressure.  3. The mitral valve is normal in structure. No evidence of mitral valve regurgitation. No evidence of mitral stenosis.  4. The aortic valve is normal in structure. Aortic valve regurgitation is mild. Mild aortic valve stenosis.  5. The inferior vena cava is normal in size with greater than 50% respiratory variability, suggesting right atrial pressure of 3 mmHg. FINDINGS  Left Ventricle: Left ventricular ejection fraction, by estimation, is 60 to 65%. The left ventricle has normal function. The left ventricle has no regional wall motion abnormalities. The left ventricular internal cavity size was normal in size. There is  mild left ventricular hypertrophy of the basal-septal segment. Left ventricular diastolic parameters are consistent with Grade I diastolic dysfunction (impaired relaxation). Indeterminate filling pressures. Right Ventricle: The right ventricular size is normal. No increase in right ventricular wall thickness. Right  ventricular systolic function is normal. There is mildly elevated pulmonary artery systolic pressure. The tricuspid regurgitant velocity is 2.66  m/s, and with an assumed right atrial pressure of 8 mmHg, the estimated right ventricular systolic pressure is 93.2 mmHg. Left Atrium: Left atrial size was normal in size. Right Atrium: Right atrial size was normal in size. Pericardium: There is no evidence of pericardial effusion. Mitral Valve: The mitral valve is normal in structure. Normal mobility of the mitral valve leaflets. No evidence of mitral valve regurgitation. No evidence of mitral valve stenosis. Tricuspid Valve: The tricuspid valve is normal in structure. Tricuspid valve regurgitation is not demonstrated. No evidence of tricuspid stenosis. Aortic Valve: The aortic valve is normal in structure. Aortic valve regurgitation is mild. Aortic regurgitation PHT measures 326 msec. Mild aortic stenosis is present. Aortic valve mean gradient measures 8.0 mmHg. Aortic valve peak gradient measures 16.6  mmHg. Aortic valve area, by VTI measures 1.37 cm. Pulmonic Valve: The pulmonic valve was normal in structure. Pulmonic valve regurgitation is not visualized. No evidence of pulmonic stenosis. Aorta: The aortic root is normal in size and structure. Venous: The inferior vena cava is normal in size with greater than 50% respiratory variability, suggesting right atrial pressure of 3 mmHg. IAS/Shunts: No atrial level shunt detected by color flow Doppler.  LEFT VENTRICLE PLAX 2D LVIDd:         3.40 cm  Diastology LVIDs:         2.20 cm  LV e' lateral:   4.35 cm/s LV PW:         1.00 cm  LV E/e' lateral: 15.3 LV IVS:        1.40 cm  LV e' medial:    8.05 cm/s LVOT diam:     1.98 cm  LV E/e' medial:  8.2  LV SV:         41 LV SV Index:   25 LVOT Area:     3.08 cm  RIGHT VENTRICLE RV S prime:     8.59 cm/s LEFT ATRIUM             Index LA diam:        3.20 cm 1.99 cm/m LA Vol (A2C):   21.6 ml 13.43 ml/m LA Vol (A4C):   28.8 ml  17.90 ml/m LA Biplane Vol: 26.4 ml 16.41 ml/m  AORTIC VALVE AV Area (Vmax):    1.15 cm AV Area (Vmean):   1.29 cm AV Area (VTI):     1.37 cm AV Vmax:           203.85 cm/s AV Vmean:          129.820 cm/s AV VTI:            0.298 m AV Peak Grad:      16.6 mmHg AV Mean Grad:      8.0 mmHg LVOT Vmax:         76.03 cm/s LVOT Vmean:        54.469 cm/s LVOT VTI:          0.133 m LVOT/AV VTI ratio: 0.45 AI PHT:            326 msec  AORTA Ao Root diam: 3.20 cm Ao Asc diam:  2.90 cm MITRAL VALVE                TRICUSPID VALVE MV Area (PHT): 3.31 cm     TR Peak grad:   28.3 mmHg MV Decel Time: 229 msec     TR Vmax:        266.00 cm/s MV E velocity: 66.40 cm/s MV A velocity: 120.00 cm/s  SHUNTS MV E/A ratio:  0.55         Systemic VTI:  0.13 m                             Systemic Diam: 1.98 cm Dani Gobble Croitoru MD Electronically signed by Sanda Klein MD Signature Date/Time: 10/07/2019/1:39:36 PM    Final    VAS [place] LOWER EXTREMITY VENOUS (DVT)  Result Date: 10/08/2019  Lower Venous DVTStudy Limitations: Size and depth of vessels, patient inability to position. Comparison Study: Prior study 03-02-2014 was positive for DVT in RT Performing Technologist: Darlin Coco  Examination Guidelines: A complete evaluation includes B-mode imaging, spectral Doppler, color Doppler, and power Doppler as needed of all accessible portions of each vessel. Bilateral testing is considered an integral part of a complete examination. Limited examinations for reoccurring indications may be performed as noted. The reflux portion of the exam is performed with the patient in reverse Trendelenburg.  +---------+---------------+---------+-----------+-----------------+------------+  RIGHT     Compressibility Phasicity Spontaneity Properties        Thrombus                                                                         Aging         +---------+---------------+---------+-----------+-----------------+------------+  CFV       Full  Yes       Yes                                         +---------+---------------+---------+-----------+-----------------+------------+  SFJ       Full                                                                  +---------+---------------+---------+-----------+-----------------+------------+  FV Prox   Full                                                                  +---------+---------------+---------+-----------+-----------------+------------+  FV Mid    None                                                    Chronic       +---------+---------------+---------+-----------+-----------------+------------+  FV Distal None                                                    Chronic       +---------+---------------+---------+-----------+-----------------+------------+  PFV       Full                                                                  +---------+---------------+---------+-----------+-----------------+------------+  POP       None                                  partially         Chronic                                                        re-cannalized                   +---------+---------------+---------+-----------+-----------------+------------+  PTV       Full                                                                  +---------+---------------+---------+-----------+-----------------+------------+  PERO      Full                                                                  +---------+---------------+---------+-----------+-----------------+------------+   +---------+---------------+---------+-----------+----------+--------------+  LEFT      Compressibility Phasicity Spontaneity Properties Thrombus Aging  +---------+---------------+---------+-----------+----------+--------------+  CFV       Full            Yes       Yes                                    +---------+---------------+---------+-----------+----------+--------------+  SFJ       Full                                                              +---------+---------------+---------+-----------+----------+--------------+  FV Prox   Full                                                             +---------+---------------+---------+-----------+----------+--------------+  FV Mid    Full                                                             +---------+---------------+---------+-----------+----------+--------------+  FV Distal Full                                                             +---------+---------------+---------+-----------+----------+--------------+  PFV       Full                                                             +---------+---------------+---------+-----------+----------+--------------+  POP       Full            Yes       Yes                                    +---------+---------------+---------+-----------+----------+--------------+  PTV       Full                                                             +---------+---------------+---------+-----------+----------+--------------+  PERO      Full                                                             +---------+---------------+---------+-----------+----------+--------------+     Summary: RIGHT: - Findings consistent with chronic deep vein thrombosis involving the right femoral vein, and right popliteal vein. - No cystic structure found in the popliteal fossa.  LEFT: - There is no evidence of deep vein thrombosis in the lower extremity.  - No cystic structure found in the popliteal fossa.  *See table(s) above for measurements and observations. Electronically signed by Ruta Hinds MD on 10/08/2019 at 12:05:37 PM.    Final         Scheduled Meds:  amLODipine  10 mg Oral Daily   atorvastatin  80 mg Oral Daily   enoxaparin (LOVENOX) injection  30 mg Subcutaneous QHS   guaiFENesin-dextromethorphan  10 mL Oral Q6H   ipratropium-albuterol  3 mL Nebulization BID   mouth rinse  15 mL Mouth Rinse BID   metoprolol tartrate  25 mg Oral  BID   nystatin cream   Topical TID   [START ON 10/10/2019] predniSONE  40 mg Oral Q breakfast   sodium chloride HYPERTONIC  4 mL Nebulization Daily   Continuous Infusions:   LOS: 3 days    Time spent: 35 mins.More than 50% of that time was spent in counseling and/or coordination of care.      Shelly Coss, MD [facility] P8/15/2021, 8:20 AM

## 2019-10-09 NOTE — Progress Notes (Signed)
Portable Equipment notified of need for bedside pulse oximeter for pt. on humidified 28% Fi02/5 lpm with open Stoma.

## 2019-10-09 NOTE — Progress Notes (Signed)
Patient was placed on ROOM AIR trach collar this morning with O2 sats 88-92%; no distress was noted during that time.  Patient O2 low 80's at this time on ROOM AIR trach collar. Patient O2 confirmed by secondary pulse ox. Patient placed back on 28% FiO2 (appox 2 L on [place]) at this time. Patient flow is @ 5 L due to the type of O2 device and appropriate setting for 28%. RT will continue to monitor patient.

## 2019-10-10 ENCOUNTER — Inpatient Hospital Stay (HOSPITAL_COMMUNITY): Payer: Medicare Other

## 2019-10-10 ENCOUNTER — Telehealth: Payer: Self-pay | Admitting: Pulmonary Disease

## 2019-10-10 DIAGNOSIS — J9601 Acute respiratory failure with hypoxia: Secondary | ICD-10-CM | POA: Diagnosis not present

## 2019-10-10 LAB — CBC WITH DIFFERENTIAL/PLATELET
Abs Immature Granulocytes: 0.05 10*3/uL (ref 0.00–0.07)
Basophils Absolute: 0 10*3/uL (ref 0.0–0.1)
Basophils Relative: 0 %
Eosinophils Absolute: 0 10*3/uL (ref 0.0–0.5)
Eosinophils Relative: 0 %
HCT: 44.4 % (ref 36.0–46.0)
Hemoglobin: 13.2 g/dL (ref 12.0–15.0)
Immature Granulocytes: 0 %
Lymphocytes Relative: 11 %
Lymphs Abs: 1.4 10*3/uL (ref 0.7–4.0)
MCH: 28.4 pg (ref 26.0–34.0)
MCHC: 29.7 g/dL — ABNORMAL LOW (ref 30.0–36.0)
MCV: 95.7 fL (ref 80.0–100.0)
Monocytes Absolute: 1.8 10*3/uL — ABNORMAL HIGH (ref 0.1–1.0)
Monocytes Relative: 14 %
Neutro Abs: 9.5 10*3/uL — ABNORMAL HIGH (ref 1.7–7.7)
Neutrophils Relative %: 75 %
Platelets: 186 10*3/uL (ref 150–400)
RBC: 4.64 MIL/uL (ref 3.87–5.11)
RDW: 12.5 % (ref 11.5–15.5)
WBC: 12.7 10*3/uL — ABNORMAL HIGH (ref 4.0–10.5)
nRBC: 0 % (ref 0.0–0.2)

## 2019-10-10 LAB — BASIC METABOLIC PANEL
Anion gap: 11 (ref 5–15)
BUN: 40 mg/dL — ABNORMAL HIGH (ref 8–23)
CO2: 25 mmol/L (ref 22–32)
Calcium: 9.1 mg/dL (ref 8.9–10.3)
Chloride: 103 mmol/L (ref 98–111)
Creatinine, Ser: 1.26 mg/dL — ABNORMAL HIGH (ref 0.44–1.00)
GFR calc Af Amer: 50 mL/min — ABNORMAL LOW (ref >60)
GFR calc non Af Amer: 43 mL/min — ABNORMAL LOW (ref >60)
Glucose, Bld: 103 mg/dL — ABNORMAL HIGH (ref 70–99)
Potassium: 4.2 mmol/L (ref 3.5–5.1)
Sodium: 139 mmol/L (ref 135–145)

## 2019-10-10 MED ORDER — APIXABAN 5 MG PO TABS
5.0000 mg | ORAL_TABLET | Freq: Two times a day (BID) | ORAL | Status: DC
  Administered 2019-10-10 – 2019-10-18 (×17): 5 mg via ORAL
  Filled 2019-10-10 (×17): qty 1

## 2019-10-10 MED ORDER — ACETAMINOPHEN 160 MG/5ML PO SOLN
650.0000 mg | Freq: Four times a day (QID) | ORAL | Status: DC | PRN
  Administered 2019-10-10 – 2019-10-14 (×7): 650 mg via ORAL
  Filled 2019-10-10 (×7): qty 20.3

## 2019-10-10 MED ORDER — SODIUM CHLORIDE 3 % IN NEBU
4.0000 mL | INHALATION_SOLUTION | Freq: Two times a day (BID) | RESPIRATORY_TRACT | Status: DC
  Administered 2019-10-10 – 2019-10-18 (×17): 4 mL via RESPIRATORY_TRACT
  Filled 2019-10-10 (×17): qty 4

## 2019-10-10 MED ORDER — SODIUM CHLORIDE 3 % IN NEBU
4.0000 mL | INHALATION_SOLUTION | Freq: Two times a day (BID) | RESPIRATORY_TRACT | Status: DC | PRN
  Filled 2019-10-10: qty 4

## 2019-10-10 MED ORDER — AMOXICILLIN-POT CLAVULANATE 875-125 MG PO TABS
1.0000 | ORAL_TABLET | Freq: Two times a day (BID) | ORAL | Status: DC
  Administered 2019-10-10 – 2019-10-18 (×17): 1 via ORAL
  Filled 2019-10-10 (×17): qty 1

## 2019-10-10 NOTE — Progress Notes (Signed)
   10/10/19 0317  Oxygen Therapy  SpO2 (!) 88 %  O2 Device Tracheostomy Collar  O2 Flow Rate (L/min) 11 L/min  Patient Activity (if Appropriate) In bed  Pulse Oximetry Type Continuous   Patient is beginning to drift down toward the high 80s O2 Saturatrion. RN called RT to perform a nebulizer treatment on the patient.

## 2019-10-10 NOTE — Discharge Instructions (Signed)
Information on my medicine - ELIQUIS (apixaban)  This medication education was reviewed with me or my healthcare representative as part of my discharge preparation.  The pharmacist that spoke with me during my hospital stay was:  Penni Homans, Student-PharmD  Why was Eliquis prescribed for you? Eliquis was prescribed to treat blood clots that may have been found in the veins of your legs (deep vein thrombosis) or in your lungs (pulmonary embolism) and to reduce the risk of them occurring again.  What do You need to know about Eliquis ? The dose is ONE 5 mg tablet taken TWICE daily. Eliquis may be taken with or without food.   Try to take the dose about the same time in the morning and in the evening. If you have difficulty swallowing the tablet whole please discuss with your pharmacist how to take the medication safely.  Take Eliquis exactly as prescribed and DO NOT stop taking Eliquis without talking to the doctor who prescribed the medication.  Stopping may increase your risk of developing a new blood clot.  Refill your prescription before you run out.  After discharge, you should have regular check-up appointments with your healthcare provider that is prescribing your Eliquis.    What do you do if you miss a dose? If a dose of ELIQUIS is not taken at the scheduled time, take it as soon as possible on the same day and twice-daily administration should be resumed. The dose should not be doubled to make up for a missed dose.  Important Safety Information A possible side effect of Eliquis is bleeding. You should call your healthcare provider right away if you experience any of the following: ? Bleeding from an injury or your nose that does not stop. ? Unusual colored urine (red or dark brown) or unusual colored stools (red or black). ? Unusual bruising for unknown reasons. ? A serious fall or if you hit your head (even if there is no bleeding).  Some medicines may interact  with Eliquis and might increase your risk of bleeding or clotting while on Eliquis. To help avoid this, consult your healthcare provider or pharmacist prior to using any new prescription or non-prescription medications, including herbals, vitamins, non-steroidal anti-inflammatory drugs (NSAIDs) and supplements.  This website has more information on Eliquis (apixaban): http://www.eliquis.com/eliquis/home

## 2019-10-10 NOTE — Care Plan (Signed)
I met daughter in the hallway and she asked me to pray with her mother. She was going to the [place] but she wanted to speak with the Dr.   I offered caring and supportive presence, prayers and blessings. Further visits will be offered.

## 2019-10-10 NOTE — Care Management Important Message (Signed)
Important Message  Patient Details IM Letter given to the Patient Name: Katelyn [name] MRN: [id number] Date of Birth: Jun 08, 1950   Medicare Important Message Given:  Yes     [name] 10/10/2019, 11:54 AM

## 2019-10-10 NOTE — Progress Notes (Signed)
NAME:  Katelyn [name], MRN:  [id number], DOB:  [birth year], LOS: 4 ADMISSION DATE:  10/06/2019, CONSULTATION DATE: 8/12 REFERRING MD:  Tawanna Solo, CHIEF COMPLAINT:  Hypoxia    Brief History   69 year old female with complicated medical history including prior laryngectomy and radical neck from laryngeal cancer, prior smoker.  Diastolic heart dysfunction.  Admitted with approximate 2-3 day history of cough and shortness of breath.  Found to be hypoxic, and hypercarbic in the emergency room.  Received 1st COVID shot 10 days prior to admit.    Arterial blood gas demonstrated hypercarbic respiratory failure.  A CT chest was obtained this was negative for pulmonary emboli no significant pulmonary edema or atelectasis.  She did have a very small hiatal hernia.  She is admitted with a working diagnosis of COPD exacerbation as a diagnosis of exclusion.  Pulmonary asked to evaluate to better define the etiology of her hypoxia and make further recommendations.  Past Medical History  Prior laryngectomy and radical neck, secondary to laryngeal cancer, hypertension, hyperlipidemia, prior nonhemorrhagic CVA, right leg deep vein thrombosis, hip fracture, diastolic heart failure.  Significant Hospital Events   8/12 Admit   Consults:  PCCM 8/13   Procedures:    Significant Diagnostic Tests:  CT chest 8/12 >> negative for pulmonary emboli, pulmonary edema, atelectasis, pneumonia or pleural effusions LE Venous Duplex 8/13 >> chronic DVT in the right femoral vein, right popliteal vein, negative left  ECHO 8/13 >> LVEF 60-65%, no RWMA, mild LVH, grade I diastolic dysfunction, RVS function normal VQ Scan 8/15 >> right mid lung perfusion defect laterally, PE can not be excluded   Micro Data:  COVID 8/12 >> negative   Antimicrobials:  Azithromycin 8/12 >>  Interim history/subjective:  O2 increased to 10L  Afebrile  RT reports pt mucus plugged while sleeping, desaturated to mid 70's. Difficulty  suctioning patient initially, required BVM over stoma site and lavage > able to suction large bloody plug out of stoma  Daughter reports patient has not been taking her medications over the last year.  She states she "was tired" and didn't want to take them anymore.  Daughter has also noted increased RLE swelling  Objective   Blood pressure 115/75, pulse 100, temperature 98.6 F (37 C), temperature source Axillary, resp. rate 20, height 4\' 11"  (1.499 m), weight 61.3 kg, SpO2 95 %.    FiO2 (%):  [40 %] 40 %   Intake/Output Summary (Last 24 hours) at 10/10/2019 1522 Last data filed at 10/10/2019 1000 Gross per 24 hour  Intake 120 ml  Output 400 ml  Net -280 ml   Filed Weights   10/06/19 2140 10/07/19 1402  Weight: 65.8 kg 61.3 kg    Examination: General: elderly female lying in bed in NAD HEENT: MM pink/moist, stoma clean /dry, ATC over site  Neuro: AAOx4, communicates via mouthing words  CV: s1s2 RRR, no m/r/g PULM:  Non-labored on ATC, good air movement bilaterally, lungs clear  GI: soft, bsx4 active  Extremities: warm/dry,  edema  Skin: no rashes or lesions  Resolved Hospital Problem list     Assessment & Plan:   Acute hypoxic and hypercarbic respiratory failure  Purulent bronchitis  Bronchospasm  Mucous plugging  Possible COPD exacerbation  Laryngectomy in setting of prior radical neck for laryngeal cancer Deconditioning Diastolic heart failure currently compensated Chronic RLE DVT    Acute hypoxic and hypercarbic respiratory failure in setting of purulent bronchitis, mucous plugging, +/- COPD exacerbation and questionable PE  / new finding  of chronic RLE DVT.   Unable to do PFTs. Was prior smoker.  CTA chest motion degraded / only large vessels ruled out.  VQ with wedge shaped defect on right which raises concern for possible PE.  -begin full dose anticoagulation, eliquis per pharmacy  -continue abx for 5 days total  -continue duoneb  -add BID hypertonic saline x  3 days  -wean O2 for sats 88-94% -pulmonary hygiene as able - mobilize  -NTS PRN  -assess for home O2 needs prior to discharge  -steroid taper  -follow intermittent CXR  Goals of Care  -recommend Palliative Care input on plan of care.  Patient has largely been off all medications for the last year and has not followed up with her [facility] team.  She has decreased appetite.  I am concerned about her prognosis from a cancer perspective and the fact that she has not wanted to take medications.  Granddaughter is POA.  Daughter would need to be involved as well. Encourage PO intake as able.  Will need to discuss wishes with patient on how to best care for her moving forward.  Her largest concern at this point is that she wants to go home.   Best practice:  Per primary  Labs    CBC: Recent Labs  Lab 10/06/19 0919 10/07/19 0431 10/10/19 0528  WBC 7.7 7.0 12.7*  NEUTROABS 5.9  --  9.5*  HGB 10.5* 12.9 13.2  HCT 33.8* 41.7 44.4  MCV 93.6 92.1 95.7  PLT 137* 168 098    Basic Metabolic Panel: Recent Labs  Lab 10/06/19 0919 10/07/19 0431 10/08/19 0520 10/09/19 0523 10/10/19 0528  NA 141 139 134* 139 139  K 3.4* 4.2 3.9 4.2 4.2  CL 108 105 100 103 103  CO2 25 24 23 25 25   GLUCOSE 89 157* 125* 114* 103*  BUN 18 29* 46* 44* 40*  CREATININE 0.84 1.16* 1.46* 1.24* 1.26*  CALCIUM 8.4* 9.7 9.3 9.4 9.1   GFR: Estimated Creatinine Clearance: 33.1 mL/min (A) (by C-G formula based on SCr of 1.26 mg/dL (H)). Recent Labs  Lab 10/06/19 0919 10/07/19 0431 10/10/19 0528  PROCALCITON <0.10  --   --   WBC 7.7 7.0 12.7*    Liver Function Tests: Recent Labs  Lab 10/06/19 0919  AST 21  ALT 14  ALKPHOS 91  BILITOT 0.7  PROT 6.9  ALBUMIN 3.6   No results for input(s): LIPASE, AMYLASE in the last 168 hours. No results for input(s): AMMONIA in the last 168 hours.  ABG    Component Value Date/Time   PHART 7.205 (L) 10/06/2019 1438   PCO2ART 59.8 (H) 10/06/2019 1438   PO2ART 88.6  10/06/2019 1438   HCO3 22.8 10/06/2019 1438   TCO2 25 04/12/2018 1127   ACIDBASEDEF 5.8 (H) 10/06/2019 1438   O2SAT 93.2 10/06/2019 1438     Coagulation Profile: No results for input(s): INR, PROTIME in the last 168 hours.  Cardiac Enzymes: No results for input(s): CKTOTAL, CKMB, CKMBINDEX, TROPONINI in the last 168 hours.  HbA1C: Hgb A1c MFr Bld  Date/Time Value Ref Range Status  10/07/2019 04:13 AM 5.4 4.8 - 5.6 % Final    Comment:    (NOTE) Pre diabetes:          5.7%-6.4%  Diabetes:              >6.4%  Glycemic control for   <7.0% adults with diabetes   03/02/2011 06:02 AM 5.6 <5.7 % Final  Comment:    (NOTE)                                                                       According to the ADA Clinical Practice Recommendations for 2011, when HbA1c is used as a screening test:  >=6.5%   Diagnostic of Diabetes Mellitus           (if abnormal result is confirmed) 5.7-6.4%   Increased risk of developing Diabetes Mellitus References:Diagnosis and Classification of Diabetes Mellitus,Diabetes WGNF,6213,08(MVHQI 1):S62-S69 and Standards of Medical Care in         Diabetes - 2011,Diabetes ONGE,9528,41 (Suppl 1):S11-S61.    CBG: No results for input(s): GLUCAP in the last 168 hours.    Critical care time: n/a     Noe Gens, MSN, NP-C [facility] 10/10/2019, 3:22 PM   Please see [email or web address] for pager details.

## 2019-10-10 NOTE — Progress Notes (Signed)
   10/10/19 0618  Vitals  Temp 100.2 F (37.9 C)   RN notified Jeannette Corpus, NP of the above information.

## 2019-10-10 NOTE — Telephone Encounter (Signed)
I have scheduled pt a HFU appt in 1 month. Pt is still admitted to hospital. This appt will show up on AVS once pt is discharged. Nothing further needed.

## 2019-10-10 NOTE — Progress Notes (Signed)
Patient was found with SpO2 at 75% during 16:00 rounds. FIO2 was increased from 40% to 98% on ATC. This RT attempted to suction the patient and was unable to pass a 14 french suction catheter. Patient was lavaged with saline & RT was able to pass a size 12 suction catheter. A moderately sized mucous plug was obtained. Rapid response RN was notified and came to the bedside as well as CCM MD & NP. SpO2 is now reading at 90% on 98% FIO2. Patient's nurse and CCM are both aware. Hypertonic nebulizer was given per CCM.

## 2019-10-10 NOTE — Progress Notes (Signed)
PROGRESS NOTE    Katelyn [name]  KZS:[id number] DOB: [birth year] DOA: 10/06/2019 PCP: Aretta Nip, MD   Brief Narrative: Katelyn [name] is a 69 y.o. female with medical history significant of laryngeal cancer status post total laryngectomy status post neck dissection and  currently on a stoma, hypertension, hyperlipidemia, nonhemorrhagic CVA, right leg DVT, hip fracture, diastolic CHF who presents to the emergency department with complaint of shortness of breath and cough .  She was admitted for the management of possible COPD exacerbation.  Hospital course remarkable for persistent requirement of oxygen.PCCM also consulted and following.  Today she was on 10 L of oxygen per minute.  Assessment & Plan:   Principal Problem:   Acute respiratory failure with hypoxia (HCC) Active Problems:   Hypercholesteremia   Essential hypertension   Cerebral infarction (HCC)   Diastolic dysfunction   Right leg DVT (HCC)   Generalized weakness   COPD exacerbation (HCC)     Acute respiratory failure with hypoxia and hypercarbia: Presented with severe shortness of breath, dry cough.  Most likely from COPD exacerbation/mucus plugging of the stoma/chronic thromboembolic disease.  She was on 5 L of oxygen per minute but oxygen requirement increased to 10 L/min..  We will try to wean the oxygen.  She needs humidification at home to prevent recurrent plugging.  Pneumonia: Chest x-ray done today showed possibility of bilateral basal pneumonia.  Started on Augmentin.  She also had mild grade fever this morning.  We will get a sputum cultures.  COPD exacerbation: No documented history of COPD as per the chart.  Presented with dyspnea, cough. ABG showed hypercarbia.  Started on Solu-Medrol,now changed to prednisone. Completed course of  azithromycin.  She needs to follow-up with pulmonology as an outpatient for PFTs.  Continue bronchodilators.  Continue supplemental oxygen as needed.  Laryngeal  cancer: Status post total laryngectomy, neck dissection.  Currently in remission.  She used  to follow-up with Dr. Vicie Mutters, ENT, at [facility].  Currently has a stoma only at her tracheostomy site.  We recommend to follow-up with ENT as an outpatient.  She is extremely noncompliant and has not taken any medications and has not seen a physician for a year.  Elevated D-dimer/history of right lower extremity DVT: Currently not on any medications. Daughter unsure about the previous medication she was taking Venous Doppler of the bilateral lower extremities showed chronic  right lower extremity DVT. CT angiogram did not show any pulmonary embolism.  Since echocardiogram showed mildly elevated pulmonary artery pressure, VQ scan has been ordered which showed right mid lung perfusion defect laterally, for which pulmonary embolism cannot be excluded.  Started on Eliquis.  Chronic diastolic congestive heart failure: Echo done on this admission showed ejection fraction of 66 5%, grade 1 diastolic dysfunction.  Does not take any diuretics at home. Chest x-ray done on 10/07/2019 showed pulmonary vascular congestion, bibasilar airspace disease consistent with edema or infection.    Given a dose of Lasix IV 40 mg once.  BNP is normal so diuretics not continued.  DDU:[id number].  Most likely secondary to Lasix that was given once.  We will hold Lasix and continue to monitor BMP.  History of nonhemorrhagic stroke: Has mild right hemiparesis.  Does not take aspirin or Plavix at home.  Daughter states she was previously on aspirin which was stopped by her previous providers.  Hyperlipidemia: Previously taking cholesterol medications but not taking anything at present.  LDL more than 200.  Started on Lipitor 80 mg  daily.  Hypertension: Noted to be hypertensive on presentation.  Not taking any medication at home.  Continue as needed medications.  Started on amlodipine.  Rash: Most likely fungal rash beneath her  breasts.  Continue nystatin  Anxiety/panic attack: Continue Xanax as needed.  Generalized weakness: Has gait problem.  Ambulates minimally with the help of walker.    PT/OT recommend home health.     DVT prophylaxis:Lovenox Code Status: Full Family Communication: Discussed with daughter on phone on 10/10/2019  Status is: Inpatient  Remains inpatient appropriate because:Hemodynamically unstable   Dispo: The patient is from: Home              Anticipated d/c is to: Home              Anticipated d/c date is: 1 to 2 L              Patient currently is not medically stable to d/c. Still requiring 10 L of oxygen per minute    Consultants: PCCM  Procedures:None  Antimicrobials:  Anti-infectives (From admission, onward)   Start     Dose/Rate Route Frequency Ordered Stop   10/06/19 1930  azithromycin (ZITHROMAX) tablet 500 mg        500 mg Oral Daily 10/06/19 1807 10/08/19 1105      Subjective:  Patient seen and examined the bedside this morning.  Hemodynamically stable.  Looks comfortable without any respiratory  distress but she is on 10 L of oxygen per minute and desaturates easily.  She had mild fever this morning.  Objective: Vitals:   10/10/19 0317 10/10/19 0332 10/10/19 0618 10/10/19 0749  BP:   125/74   Pulse:  84 97 96  Resp:  16 20 16   Temp:   100.2 F (37.9 C)   TempSrc:   Oral   SpO2: (!) 88% 95% 91% 93%  Weight:      Height:        Intake/Output Summary (Last 24 hours) at 10/10/2019 0805 Last data filed at 10/10/2019 6811 Gross per 24 hour  Intake --  Output 400 ml  Net -400 ml   Filed Weights   10/06/19 2140 10/07/19 1402  Weight: 65.8 kg 61.3 kg    Examination:  General exam: Deconditioned, debilitated, not in acute respiratory distress HEENT: Tracheostomy Respiratory system: Bilateral diminished air sounds  cardiovascular system: S1 & S2 heard, RRR. No JVD, murmurs, rubs, gallops or clicks. Gastrointestinal system: Abdomen is nondistended,  soft and nontender. No organomegaly or masses felt. Normal bowel sounds heard. Central nervous system: Alert and oriented. No focal neurological deficits. Extremities: No edema, no clubbing ,no cyanosis Skin: No rashes, lesions or ulcers,no icterus ,no pallor    Data Reviewed: I have personally reviewed following labs and imaging studies  CBC: Recent Labs  Lab 10/06/19 0919 10/07/19 0431 10/10/19 0528  WBC 7.7 7.0 12.7*  NEUTROABS 5.9  --  9.5*  HGB 10.5* 12.9 13.2  HCT 33.8* 41.7 44.4  MCV 93.6 92.1 95.7  PLT 137* 168 572   Basic Metabolic Panel: Recent Labs  Lab 10/06/19 0919 10/07/19 0431 10/08/19 0520 10/09/19 0523 10/10/19 0528  NA 141 139 134* 139 139  K 3.4* 4.2 3.9 4.2 4.2  CL 108 105 100 103 103  CO2 25 24 23 25 25   GLUCOSE 89 157* 125* 114* 103*  BUN 18 29* 46* 44* 40*  CREATININE 0.84 1.16* 1.46* 1.24* 1.26*  CALCIUM 8.4* 9.7 9.3 9.4 9.1   GFR: Estimated Creatinine Clearance:  33.1 mL/min (A) (by C-G formula based on SCr of 1.26 mg/dL (H)). Liver Function Tests: Recent Labs  Lab 10/06/19 0919  AST 21  ALT 14  ALKPHOS 91  BILITOT 0.7  PROT 6.9  ALBUMIN 3.6   No results for input(s): LIPASE, AMYLASE in the last 168 hours. No results for input(s): AMMONIA in the last 168 hours. Coagulation Profile: No results for input(s): INR, PROTIME in the last 168 hours. Cardiac Enzymes: No results for input(s): CKTOTAL, CKMB, CKMBINDEX, TROPONINI in the last 168 hours. BNP (last 3 results) No results for input(s): PROBNP in the last 8760 hours. HbA1C: No results for input(s): HGBA1C in the last 72 hours. CBG: No results for input(s): GLUCAP in the last 168 hours. Lipid Profile: No results for input(s): CHOL, HDL, LDLCALC, TRIG, CHOLHDL, LDLDIRECT in the last 72 hours. Thyroid Function Tests: No results for input(s): TSH, T4TOTAL, FREET4, T3FREE, THYROIDAB in the last 72 hours. Anemia Panel: No results for input(s): VITAMINB12, FOLATE, FERRITIN, TIBC,  IRON, RETICCTPCT in the last 72 hours. Sepsis Labs: Recent Labs  Lab 10/06/19 0919  PROCALCITON <0.10    Recent Results (from the past 240 hour(s))  SARS Coronavirus 2 by RT PCR (hospital order, performed in [facility] hospital lab) Nasopharyngeal Nasopharyngeal Swab     Status: None   Collection Time: 10/06/19  9:28 AM   Specimen: Nasopharyngeal Swab  Result Value Ref Range Status   SARS Coronavirus 2 NEGATIVE NEGATIVE Final    Comment: (NOTE) SARS-CoV-2 target nucleic acids are NOT DETECTED.  The SARS-CoV-2 RNA is generally detectable in upper and lower respiratory specimens during the acute phase of infection. The lowest concentration of SARS-CoV-2 viral copies this assay can detect is 250 copies / mL. A negative result does not preclude SARS-CoV-2 infection and should not be used as the sole basis for treatment or other patient management decisions.  A negative result may occur with improper specimen collection / handling, submission of specimen other than nasopharyngeal swab, presence of viral mutation(s) within the areas targeted by this assay, and inadequate number of viral copies (<250 copies / mL). A negative result must be combined with clinical observations, patient history, and epidemiological information.  Fact Sheet for Patients:   StrictlyIdeas.no  Fact Sheet for Healthcare Providers: BankingDealers.co.za  This test is not yet approved or  cleared by the [place] FDA and has been authorized for detection and/or diagnosis of SARS-CoV-2 by FDA under an Emergency Use Authorization (EUA).  This EUA will remain in effect (meaning this test can be used) for the duration of the COVID-19 declaration under Section 564(b)(1) of the Act, 21 U.S.C. section 360bbb-3(b)(1), unless the authorization is terminated or revoked sooner.  Performed at [facility], [place] [address], [place], [place] [address]           Radiology Studies: NM Pulmonary Perfusion  Result Date: 10/09/2019 CLINICAL DATA:  Respiratory failure EXAM: NUCLEAR MEDICINE PERFUSION LUNG SCAN TECHNIQUE: Perfusion images were obtained in multiple projections after intravenous injection of radiopharmaceutical. Ventilation scans intentionally deferred if perfusion scan and chest x-ray adequate for interpretation during COVID 19 epidemic. RADIOPHARMACEUTICALS:  4.2 mCi Tc-4m MAA IV COMPARISON:  chest radiograph of 10/09/2019 FINDINGS: Right mid lung perfusion defect laterally is somewhat wedge-shaped, and is without correlate on chest radiograph. The left lung perfuses normally. IMPRESSION: Right mid lung perfusion defect laterally, for which pulmonary embolism cannot be excluded. Consider lower extremity ultrasound or if feasible, CTA of the chest. Electronically Signed  By: Abigail Miyamoto M.D.   On: 10/09/2019 12:09   DG CHEST PORT 1 VIEW  Result Date: 10/09/2019 CLINICAL DATA:  Hypoxia prior to V/Q scan. EXAM: PORTABLE CHEST 1 VIEW COMPARISON:  10/07/2019 FINDINGS: No focal consolidation. No pleural effusion or pneumothorax. Stable cardiomegaly. No acute osseous abnormality. IMPRESSION: No active disease. Electronically Signed   By: Kathreen Devoid   On: 10/09/2019 12:57        Scheduled Meds: . amLODipine  10 mg Oral Daily  . apixaban  5 mg Oral BID  . atorvastatin  80 mg Oral Daily  . guaiFENesin-dextromethorphan  10 mL Oral Q6H  . ipratropium-albuterol  3 mL Nebulization BID  . mouth rinse  15 mL Mouth Rinse BID  . metoprolol tartrate  25 mg Oral BID  . nystatin cream   Topical TID  . predniSONE  40 mg Oral Q breakfast   Continuous Infusions:   LOS: 4 days    Time spent: 35 mins.More than 50% of that time was spent in counseling and/or coordination of care.      Shelly Coss, MD [facility] P8/16/2021, 8:05 AM

## 2019-10-10 NOTE — Progress Notes (Signed)
PT Cancellation Note  Patient Details Name: Katelyn [name] MRN: [id number] DOB: 16-Aug-1950   Cancelled Treatment:    Reason Eval/Treat Not Completed: Attempted PT tx session-RT was in with pt. Will check back another day.    [facility]  Office: [phone] Pager: [phone]

## 2019-10-10 NOTE — Telephone Encounter (Signed)
Patient with hx of laryngeal cancer s/p laryngectomy, smoker, COPD.  New dx of suspected PE, hx chronic DVT.  On anticoagulation.  O2 needs 40% ATC.  Please arrange for patient to have follow up hospital appt in one month with NP.   Thanks!   Noe Gens, MSN, NP-C [facility] 10/10/2019, 8:16 AM   Please see [email or web address] for pager details.

## 2019-10-10 NOTE — Plan of Care (Signed)
°  Problem: Education: Goal: Knowledge of General Education information will improve Description: Including pain rating scale, medication(s)/side effects and non-pharmacologic comfort measures Outcome: Progressing   Problem: Clinical Measurements: Goal: Respiratory complications will improve Outcome: Progressing Goal: Cardiovascular complication will be avoided Outcome: Progressing   Problem: Coping: Goal: Level of anxiety will decrease Outcome: Progressing   Problem: Elimination: Goal: Will not experience complications related to urinary retention Outcome: Progressing   Problem: Safety: Goal: Ability to remain free from injury will improve Outcome: Progressing   Problem: Skin Integrity: Goal: Risk for impaired skin integrity will decrease Outcome: Progressing

## 2019-10-10 NOTE — Progress Notes (Signed)
BP soft in the 90s this AM- held her metoprolol and amlodipine, Dr. Tawanna Solo notified

## 2019-10-11 DIAGNOSIS — Z7189 Other specified counseling: Secondary | ICD-10-CM

## 2019-10-11 DIAGNOSIS — Z91199 Patient's noncompliance with other medical treatment and regimen due to unspecified reason: Secondary | ICD-10-CM

## 2019-10-11 DIAGNOSIS — Z9119 Patient's noncompliance with other medical treatment and regimen: Secondary | ICD-10-CM

## 2019-10-11 DIAGNOSIS — J441 Chronic obstructive pulmonary disease with (acute) exacerbation: Principal | ICD-10-CM

## 2019-10-11 DIAGNOSIS — Z515 Encounter for palliative care: Secondary | ICD-10-CM

## 2019-10-11 DIAGNOSIS — J9601 Acute respiratory failure with hypoxia: Secondary | ICD-10-CM | POA: Diagnosis not present

## 2019-10-11 LAB — CBC WITH DIFFERENTIAL/PLATELET
Abs Immature Granulocytes: 0.04 10*3/uL (ref 0.00–0.07)
Basophils Absolute: 0 10*3/uL (ref 0.0–0.1)
Basophils Relative: 0 %
Eosinophils Absolute: 0 10*3/uL (ref 0.0–0.5)
Eosinophils Relative: 0 %
HCT: 41.5 % (ref 36.0–46.0)
Hemoglobin: 12.4 g/dL (ref 12.0–15.0)
Immature Granulocytes: 0 %
Lymphocytes Relative: 10 %
Lymphs Abs: 1.1 10*3/uL (ref 0.7–4.0)
MCH: 28.6 pg (ref 26.0–34.0)
MCHC: 29.9 g/dL — ABNORMAL LOW (ref 30.0–36.0)
MCV: 95.6 fL (ref 80.0–100.0)
Monocytes Absolute: 0.5 10*3/uL (ref 0.1–1.0)
Monocytes Relative: 5 %
Neutro Abs: 9.4 10*3/uL — ABNORMAL HIGH (ref 1.7–7.7)
Neutrophils Relative %: 85 %
Platelets: 164 10*3/uL (ref 150–400)
RBC: 4.34 MIL/uL (ref 3.87–5.11)
RDW: 12.4 % (ref 11.5–15.5)
WBC: 11.1 10*3/uL — ABNORMAL HIGH (ref 4.0–10.5)
nRBC: 0 % (ref 0.0–0.2)

## 2019-10-11 NOTE — Consult Note (Signed)
Consultation Note Date: 10/11/2019   Patient Name: Katelyn [name]  DOB: December 18, 1950  MRN: [id number]  Age / Sex: 69 y.o., female  PCP: Aretta Nip, MD Referring Physician: Shelly Coss, MD  Reason for Consultation: Establishing goals of care  HPI/Patient Profile: 69 y.o. female  with past medical history of laryngeal cancer s/p laryngectomy and neck dissection with stoma (in remission as far as we know - she has not followed up with oncology as recommended), diastolic CHF,  hypertension, hyperlipidemia, nonhemorrhagic stroke, right leg DVT, hip fracture admitted on 10/06/2019 with shortness of breath and dry cough likely due to COPD exacerbation (new diagnosis of COPD). She is noted to not have any follow up or taken any medications over the past year. Questionable PE. Mucus plugging noted while hospitalized. Patient is expresses that she is tired and wants to go home.   Clinical Assessment and Goals of Care: I met today with Katelyn [name]. She has no distress and is lying comfortably in bed with with trach collar on. Granddaughter at bedside and daughter present via FaceTime. They do share that she stopped taking her medication and going to appointments about a year ago. No clear reason except she was tired of the medications and appointments. Family insists that I reach out to [facility] [employer], and I do intend to do so. Katelyn [name] indicates that she wants me to speak with Katelyn [name] as well and does not want to speak with me at this time.   I called and spoke with Katelyn [name]. We discussed concern for COPD exacerbation as well as mucus plugging which can cause her distress. I discussed that we are treated her here with nebulizers, oxygen, humidity. However, even if these treatments are helpful to improve her health she will need to be willing to continue recommended therapies at home to not be back in this  same position. I asked Katelyn [name] about goals and wishes. She shares that her grandmother has last told her that she wishes to fight and wants to live. We discussed full code and Katelyn [name] believes that her grandmother desires full code. We did discuss the poor prognosis associated with resuscitation event. I also worry if she is not willing to take a pill then she certainly would not want to be on life support. I also discussed the importance of follow up with oncology as there is higher risk of reoccurrence within the first couple years after surgery and if there is a reoccurrence this would impact decisions made. Katelyn [name] agrees.   Katelyn [name] says that her grandmother chose her as HCPOA as she felt her other children and grandchildren would be too emotional to carry out her wishes. Katelyn [name] plans to speak further with her grandmother. I will have my colleague follow up with [facility] tomorrow to see what they discuss. We also discussed that we can arrange family meeting to discuss decisions and plan to move forward as indicated. Katelyn [name] will speak with Ms. Koenen and we will follow up tomorrow to arrange meeting if needed.  All questions/concerns addressed. Emotional support provided.   Primary Decision Maker HCPOA Katelyn [name]    SUMMARY OF RECOMMENDATIONS   - Ongoing [place] discussions  Code Status/Advance Care Planning:  Full code   Symptom Management:   Per attending and PCCM.   Palliative Prophylaxis:   Aspiration, Delirium Protocol, Oral Care and Turn Reposition  Psycho-social/Spiritual:   Desire for further Chaplaincy support:no  Prognosis:   To be determined based on aggressiveness of care desired. If she does not wish to continue medications, etc at home prognosis will remain poor and could be eligible for hospice if she wishes to forego further medical treatment, work up, surveillance.   Discharge Planning: To Be Determined      Primary Diagnoses: Present on Admission: .  Acute respiratory failure with hypoxia ([place]) . COPD exacerbation ([place]) . Hypercholesteremia . Essential hypertension . Cerebral infarction ([place]) . Right leg DVT ([place])   I have reviewed the medical record, interviewed the patient and family, and examined the patient. The following aspects are pertinent.  Past Medical History:  Diagnosis Date  . Acute gallstone pancreatitis 01/08/2014  . AKI (acute kidney injury) ([place]) 01/08/2014  . Anemia   . Blood transfusion    11'15 last admission  . Cancer ([place])   . Cholecystitis   . H/O tracheostomy   . Hip fracture ([place])   . Hypercholesteremia   . Hypertension   . Right leg DVT ([place]) 05/08/2014  . Septic shock - Kelbsiella 01/09/2014  . Shortness of breath   . Stroke [facility])    '13- right side weakness, ambulates with cane   Social History   Socioeconomic History  . Marital status: Widowed    Spouse name: Not on file  . Number of children: Not on file  . Years of education: Not on file  . Highest education level: Not on file  Occupational History  . Occupation: Retired  Tobacco Use  . Smoking status: Former Smoker    Packs/day: 0.25    Types: Cigarettes    Quit date: 05/29/2017    Years since quitting: 2.3  . Smokeless tobacco: Never Used  Substance and Sexual Activity  . Alcohol use: No    Alcohol/week: 0.0 standard drinks  . Drug use: No  . Sexual activity: Not on file  Other Topics Concern  . Not on file  Social History Narrative  . Not on file   Social Determinants of Health   Financial Resource Strain:   . Difficulty of Paying Living Expenses:   Food Insecurity:   . Worried About Charity fundraiser in the Last Year:   . Arboriculturist in the Last Year:   Transportation Needs:   . Film/video editor (Medical):   Marland Kitchen Lack of Transportation (Non-Medical):   Physical Activity:   . Days of Exercise per Week:   . Minutes of Exercise per Session:   Stress:   . Feeling of Stress :   Social Connections:   .  Frequency of Communication with Friends and Family:   . Frequency of Social Gatherings with Friends and Family:   . Attends Religious Services:   . Active Member of Clubs or Organizations:   . Attends Archivist Meetings:   Marland Kitchen Marital Status:    Family History  Problem Relation Age of Onset  . Breast cancer Maternal Aunt   . Diabetes Mellitus I Father   . Diabetes Mellitus I Sister   . Anesthesia problems Neg Hx   .  Hypotension Neg Hx   . Malignant hyperthermia Neg Hx   . Pseudochol deficiency Neg Hx   . Colon cancer Neg Hx   . Esophageal cancer Neg Hx   . Pancreatic cancer Neg Hx   . Kidney disease Neg Hx   . Liver disease Neg Hx    Scheduled Meds: . amLODipine  10 mg Oral Daily  . amoxicillin-clavulanate  1 tablet Oral Q12H  . apixaban  5 mg Oral BID  . atorvastatin  80 mg Oral Daily  . guaiFENesin-dextromethorphan  10 mL Oral Q6H  . ipratropium-albuterol  3 mL Nebulization BID  . mouth rinse  15 mL Mouth Rinse BID  . metoprolol tartrate  25 mg Oral BID  . nystatin cream   Topical TID  . predniSONE  40 mg Oral Q breakfast  . sodium chloride HYPERTONIC  4 mL Nebulization BID   Continuous Infusions: PRN Meds:.acetaminophen (TYLENOL) oral liquid 160 mg/5 mL, albuterol, ALPRAZolam, labetalol No Known Allergies Review of Systems  Unable to perform ROS: Patient nonverbal    Physical Exam Vitals and nursing note reviewed.  Constitutional:      General: She is not in acute distress.    Appearance: She is well-groomed. She is ill-appearing.  Cardiovascular:     Rate and Rhythm: Normal rate.  Pulmonary:     Effort: Pulmonary effort is normal. No tachypnea, accessory muscle usage or respiratory distress.     Comments: Trach collar 7L Abdominal:     General: Abdomen is flat.  Neurological:     Mental Status: She is alert.     Comments: Unable to assess orientation     Vital Signs: BP 109/76 (BP Location: Right Arm)   Pulse 78   Temp 99.2 F (37.3 C)  (Oral)   Resp (!) 26   Ht '4\' 11"'  (1.499 m)   Wt 61.3 kg   SpO2 91%   BMI 27.30 kg/m  Pain Scale: 0-10   Pain Score: 0-No pain   SpO2: SpO2: 91 % O2 Device:SpO2: 91 % O2 Flow Rate: .O2 Flow Rate (L/min): 10 L/min  IO: Intake/output summary:   Intake/Output Summary (Last 24 hours) at 10/11/2019 9562 Last data filed at 10/11/2019 0425 Gross per 24 hour  Intake 240 ml  Output 475 ml  Net -235 ml    LBM: Last BM Date: 10/07/19 Baseline Weight: Weight: 65.8 kg Most recent weight: Weight: 61.3 kg     Palliative Assessment/Data:     Time In: 1100 Time Out: 1150 Time Total: 50 min Greater than 50%  of this time was spent counseling and coordinating care related to the above assessment and plan.  Signed by: Vinie Sill, NP Palliative Medicine Team Pager # [phone] (M-F 8a-5p) Team Phone # [phone] (Nights/Weekends)

## 2019-10-11 NOTE — Progress Notes (Signed)
NAME:  Katelyn [name], MRN:  [id number], DOB:  1950/09/07, LOS: 5 ADMISSION DATE:  10/06/2019, CONSULTATION DATE: 8/12 REFERRING MD:  Tawanna Solo, CHIEF COMPLAINT:  Hypoxia    Brief History   69 year old female with complicated medical history including prior laryngectomy and radical neck from laryngeal cancer, prior smoker.  Diastolic heart dysfunction.  Admitted with approximate 2-3 day history of cough and shortness of breath.  Found to be hypoxic, and hypercarbic in the emergency room.  Received 1st COVID shot 10 days prior to admit.    Arterial blood gas demonstrated hypercarbic respiratory failure.  A CT chest was obtained this was negative for pulmonary emboli no significant pulmonary edema or atelectasis.  She did have a very small hiatal hernia.  She is admitted with a working diagnosis of COPD exacerbation as a diagnosis of exclusion.  Pulmonary asked to evaluate to better define the etiology of her hypoxia and make further recommendations.  Past Medical History  Prior laryngectomy and radical neck, secondary to laryngeal cancer, hypertension, hyperlipidemia, prior nonhemorrhagic CVA, right leg deep vein thrombosis, hip fracture, diastolic heart failure.  Significant Hospital Events   8/12 Admit  8/16 Episode of mucus plugging, increased O2 needs   Consults:  PCCM 8/13  Palliative Care 8/17   Procedures:    Significant Diagnostic Tests:  CT chest 8/12 >> negative for pulmonary emboli, pulmonary edema, atelectasis, pneumonia or pleural effusions LE Venous Duplex 8/13 >> chronic DVT in the right femoral vein, right popliteal vein, negative left  ECHO 8/13 >> LVEF 60-65%, no RWMA, mild LVH, grade I diastolic dysfunction, RVS function normal VQ Scan 8/15 >> right mid lung perfusion defect laterally, PE can not be excluded   Micro Data:  COVID 8/12 >> negative   Antimicrobials:  Azithromycin 8/12 >>  Interim history/subjective:  Afebrile  O2 at 98% ATC, RT reports patient  has been up / down in terms of needs today   Requiring intermittent lavage/suctioning per RT Pt reports she would like some apple juice  Ate ice cream cup and few bites of soup for lunch  Objective   Blood pressure 117/74, pulse 85, temperature (!) 97.5 F (36.4 C), temperature source Oral, resp. rate (!) 38, height 4\' 11"  (1.499 m), weight 61.3 kg, SpO2 92 %.    FiO2 (%):  [40 %-98 %] 98 %   Intake/Output Summary (Last 24 hours) at 10/11/2019 1353 Last data filed at 10/11/2019 0954 Gross per 24 hour  Intake 240 ml  Output 475 ml  Net -235 ml   Filed Weights   10/06/19 2140 10/07/19 1402  Weight: 65.8 kg 61.3 kg    Examination: General: chronically ill appearing adult female lying in bed in NAD HEENT: MM pink/moist, stoma clean / dry, aerosolized collar in place Neuro: AAOx4, communicates by mouthing words / easily understood CV: s1s2 RRR, no m/r/g PULM: non-labored / no effort, lungs bilaterally diminished, 98% ATC currently GI: soft, bsx4 active  Extremities: warm/dry, no edema  Skin: no rashes or lesions  Resolved Hospital Problem list     Assessment & Plan:   Acute hypoxic and hypercarbic respiratory failure  Purulent bronchitis  Bronchospasm  Mucous plugging  Possible COPD exacerbation  Laryngectomy in setting of prior radical neck for laryngeal cancer Deconditioning Diastolic heart failure currently compensated Chronic RLE DVT    Acute hypoxic and hypercarbic respiratory failure in setting of purulent bronchitis, mucous plugging, +/- COPD exacerbation and questionable PE  / new finding of chronic RLE DVT.   Unable  to do PFTs. Was prior smoker.  CTA chest motion degraded / only large vessels ruled out.  VQ with wedge shaped defect on right which raises concern for possible PE.  -continue eliquis per pharmacy  -duoneb -pulmonary hygiene -upright positioning  -BID hypertonic saline x3 days -wean O2 needs for sats 88-94% -NTS PRN  -steroid taper  -follow  intermittent CXR  -assess for home O2 needs prior to discharge   Goals of Care  -appreciate palliative care input  -multiple concerns with patient not wanting to use medical care in the last year.  She has not had ONC follow up for surveillance.    Best practice:  Per primary  Labs    CBC: Recent Labs  Lab 10/06/19 0919 10/07/19 0431 10/10/19 0528 10/11/19 0546  WBC 7.7 7.0 12.7* 11.1*  NEUTROABS 5.9  --  9.5* 9.4*  HGB 10.5* 12.9 13.2 12.4  HCT 33.8* 41.7 44.4 41.5  MCV 93.6 92.1 95.7 95.6  PLT 137* 168 186 053    Basic Metabolic Panel: Recent Labs  Lab 10/06/19 0919 10/07/19 0431 10/08/19 0520 10/09/19 0523 10/10/19 0528  NA 141 139 134* 139 139  K 3.4* 4.2 3.9 4.2 4.2  CL 108 105 100 103 103  CO2 25 24 23 25 25   GLUCOSE 89 157* 125* 114* 103*  BUN 18 29* 46* 44* 40*  CREATININE 0.84 1.16* 1.46* 1.24* 1.26*  CALCIUM 8.4* 9.7 9.3 9.4 9.1   GFR: Estimated Creatinine Clearance: 33.1 mL/min (A) (by C-G formula based on SCr of 1.26 mg/dL (H)). Recent Labs  Lab 10/06/19 0919 10/07/19 0431 10/10/19 0528 10/11/19 0546  PROCALCITON <0.10  --   --   --   WBC 7.7 7.0 12.7* 11.1*    Liver Function Tests: Recent Labs  Lab 10/06/19 0919  AST 21  ALT 14  ALKPHOS 91  BILITOT 0.7  PROT 6.9  ALBUMIN 3.6   No results for input(s): LIPASE, AMYLASE in the last 168 hours. No results for input(s): AMMONIA in the last 168 hours.  ABG    Component Value Date/Time   PHART 7.205 (L) 10/06/2019 1438   PCO2ART 59.8 (H) 10/06/2019 1438   PO2ART 88.6 10/06/2019 1438   HCO3 22.8 10/06/2019 1438   TCO2 25 04/12/2018 1127   ACIDBASEDEF 5.8 (H) 10/06/2019 1438   O2SAT 93.2 10/06/2019 1438     Coagulation Profile: No results for input(s): INR, PROTIME in the last 168 hours.  Cardiac Enzymes: No results for input(s): CKTOTAL, CKMB, CKMBINDEX, TROPONINI in the last 168 hours.  HbA1C: Hgb A1c MFr Bld  Date/Time Value Ref Range Status  10/07/2019 04:13 AM 5.4 4.8  - 5.6 % Final    Comment:    (NOTE) Pre diabetes:          5.7%-6.4%  Diabetes:              >6.4%  Glycemic control for   <7.0% adults with diabetes   03/02/2011 06:02 AM 5.6 <5.7 % Final    Comment:    (NOTE)                                                                       According to the ADA Clinical Practice Recommendations for  2011, when HbA1c is used as a screening test:  >=6.5%   Diagnostic of Diabetes Mellitus           (if abnormal result is confirmed) 5.7-6.4%   Increased risk of developing Diabetes Mellitus References:Diagnosis and Classification of Diabetes Mellitus,Diabetes HKVQ,2595,63(OVFIE 1):S62-S69 and Standards of Medical Care in         Diabetes - 2011,Diabetes PPIR,5188,41 (Suppl 1):S11-S61.    CBG: No results for input(s): GLUCAP in the last 168 hours.    Critical care time: n/a     Noe Gens, MSN, NP-C [facility] 10/11/2019, 1:53 PM   Please see [email or web address] for pager details.

## 2019-10-11 NOTE — Evaluation (Signed)
Clinical/Bedside Swallow Evaluation Patient Details  Name: Katelyn [name] MRN: [id number] Date of Birth: Oct 30, 1950  Today's Date: 10/11/2019 Time: SLP Start Time (ACUTE ONLY): 1430 SLP Stop Time (ACUTE ONLY): 1459 SLP Time Calculation (min) (ACUTE ONLY): 29 min  Past Medical History:  Past Medical History:  Diagnosis Date  . Acute gallstone pancreatitis 01/08/2014  . AKI (acute kidney injury) ([place]) 01/08/2014  . Anemia   . Blood transfusion    11'15 last admission  . Cancer ([place])   . Cholecystitis   . H/O tracheostomy   . Hip fracture ([place])   . Hypercholesteremia   . Hypertension   . Right leg DVT ([place]) 05/08/2014  . Septic shock - Kelbsiella 01/09/2014  . Shortness of breath   . Stroke [facility])    '13- right side weakness, ambulates with cane   Past Surgical History:  Past Surgical History:  Procedure Laterality Date  . ABDOMINAL HYSTERECTOMY    . DIRECT LARYNGOSCOPY N/A 06/08/2017   Procedure: DIRECT LARYNGOSCOPY WITH BIOPSY;  Surgeon: Helayne Seminole, MD;  Location[facility] OR;  Service: ENT;  Laterality: N/A;  . ENDOSCOPIC RETROGRADE CHOLANGIOPANCREATOGRAPHY (ERCP) WITH PROPOFOL N/A 01/11/2014   Procedure: ENDOSCOPIC RETROGRADE CHOLANGIOPANCREATOGRAPHY (ERCP) WITH PROPOFOL;  Surgeon: Gatha Mayer, MD;  Location: [place];  Service: Endoscopy;  Laterality: N/A;  . ERCP    . ERCP N/A 05/26/2014   Procedure: ENDOSCOPIC RETROGRADE CHOLANGIOPANCREATOGRAPHY (ERCP);  Surgeon: Gatha Mayer, MD;  Location: [facility];  Service: Endoscopy;  Laterality: N/A;  . ESOPHAGOGASTRODUODENOSCOPY (EGD) WITH PROPOFOL N/A 12/07/2014   Procedure: ESOPHAGOGASTRODUODENOSCOPY (EGD) WITH PROPOFOL;  Surgeon: Milus Banister, MD;  Location: [facility] ENDOSCOPY;  Service: Endoscopy;  Laterality: N/A;  . EUS N/A 12/07/2014   Procedure: UPPER ENDOSCOPIC ULTRASOUND (EUS) LINEAR;  Surgeon: Milus Banister, MD;  Location: [facility] ENDOSCOPY;  Service: Endoscopy;  Laterality: N/A;  . GASTROINTESTINAL STENT  REMOVAL N/A 12/07/2014   Procedure: GASTROINTESTINAL STENT REMOVAL;  Surgeon: Milus Banister, MD;  Location: [facility] ENDOSCOPY;  Service: Endoscopy;  Laterality: N/A;  pancreatic stent removal  . HIP FRACTURE SURGERY    . INTRAMEDULLARY (IM) NAIL INTERTROCHANTERIC Right 05/29/2017   Procedure: INTRAMEDULLARY (IM) NAIL INTERTROCHANTRIC FEMUR;  Surgeon: Nicholes Stairs, MD;  Location: [place];  Service: Orthopedics;  Laterality: Right;  . PERCUTANEOUS TRACHEOSTOMY N/A 05/29/2017   Procedure: PERCUTANEOUS TRACHEOSTOMY;  Surgeon: Judeth Horn, MD;  Location: [place];  Service: General;  Laterality: N/A;  . TEE WITHOUT CARDIOVERSION  03/06/2011   Procedure: TRANSESOPHAGEAL ECHOCARDIOGRAM (TEE);  Surgeon: Jolaine Artist, MD;  Location: [facility] ENDOSCOPY;  Service: Cardiovascular;  Laterality: N/A;   HPI:  69 yo female adm to [facility] with respiratory failure.  Pt PMH + for epiglottic lesion noted in 2016, laryngeal cancer diagnosis in 2019 s/p laryngectomy and radical neck dissection completed at [facility] 07/2017.  She has h/o right leg DVT, COPD, Essential HTN, CVA 2013, fall with hip fx in 05/2017, ataxia, neuropathy and dysphagia.  Swallow evaluation ordered as MD concerned for pt having aspiration risk per notes. Pt used to use a Lary-tube but no longer uses it.    Swallow eval ordered.   Assessment / Plan / Recommendation Clinical Impression  Pt reports symptoms of intermittent dysphagia with inconsistent sensation of food lodging in pharynx/esophagus needing liquids to clear.  She is s/p laryngectomy, thus would only aspirate if she has a TE fistula or if she is spilling food or drink into her stoma. Pt does admit to some "coughing" with intake however  uncertain to source.  SLP advised pt to consume plenty of liquids with meals to help clear pharynx.  SLP observed pt with water, apple juice and fruit cocktail.  NO indication of aspiration - coughing with intake thay may indicate a TE fistula.  Pt does report poor  gustatory sensation likely due to her laryngectomy.  Encouraged her to consume fluids to help thin secretions. SLP Visit Diagnosis: Dysphagia, unspecified (R13.10)    Aspiration Risk  Risk for inadequate nutrition/hydration    Diet Recommendation Regular (soft solids - pt would like to keep diet liberalized)   Liquid Administration via: Cup;Straw Medication Administration: Other (Comment) (pt has medications crushed with applesauce per her statement) Supervision: Patient able to self feed Compensations: Slow rate;Small sips/bites (drink liquids t/o meal) Postural Changes: Remain upright for at least 30 minutes after po intake;Seated upright at 90 degrees    Other  Recommendations Oral Care Recommendations: Oral care BID   Follow up Recommendations   n/a     Frequency and Duration min 1 x/week  1 week       Prognosis Prognosis for Safe Diet Advancement: Good      Swallow Study   General Date of Onset: 10/11/19 HPI: 69 yo female adm to [facility] with respiratory failure.  Pt PMH + for epiglottic lesion noted in 2016, laryngeal cancer diagnosis in 2019 s/p laryngectomy and radical neck dissection completed at [facility] 07/2017.  She has h/o right leg DVT, COPD, Essential HTN, CVA 2013, fall with hip fx in 05/2017, ataxia, neuropathy and dysphagia.  Swallow evaluation ordered as MD concerned for pt having aspiration risk per notes. Pt used to use a Lary-tube but no longer uses it.    Swallow eval ordered. Type of Study: Bedside Swallow Evaluation Diet Prior to this Study: Regular;Thin liquids Temperature Spikes Noted: No Respiratory Status: Trach Collar (for stoma) History of Recent Intubation: No Behavior/Cognition: Alert;Cooperative Oral Cavity Assessment: Within Functional Limits Oral Care Completed by SLP: No Oral Cavity - Dentition: Edentulous (pt reports she does not use dentures and prefers soft foods) Self-Feeding Abilities: Able to feed self Patient Positioning: Upright in  bed Baseline Vocal Quality: Not observed Volitional Cough: Weak Volitional Swallow: Able to elicit    Oral/Motor/Sensory Function Overall Oral Motor/Sensory Function: Within functional limits (pt does not have a larynx)   Ice Chips Ice chips: Not tested   Thin Liquid Thin Liquid: Within functional limits Presentation: Straw;Self Fed Other Comments: pt did not consume 3 ounces of water sequentially but does not demonstrate any indication of possible aspiration that could occur due to TE fistula    Nectar Thick Nectar Thick Liquid: Not tested   Honey Thick Honey Thick Liquid: Not tested   Puree Puree: Not tested   Solid     Solid: Within functional limits Presentation: Self Fed      Macario Golds 10/11/2019,3:59 PM Kathleen Lime, MS [place] Office [phone]

## 2019-10-11 NOTE — Evaluation (Signed)
Speech Language Pathology Evaluation Patient Details Name: Katelyn [name] MRN: [id number] DOB: 28-Apr-1950 Today's Date: 10/11/2019 Time: 3614-4315 SLP Time Calculation (min) (ACUTE ONLY): 23 min  Problem List:  Patient Active Problem List   Diagnosis Date Noted  . History of noncompliance with medical treatment   . Acute respiratory failure with hypoxia ([place]) 10/06/2019  . COPD exacerbation ([place]) 10/06/2019  . Generalized weakness 07/12/2017  . Palliative care encounter 07/12/2017  . 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 Pressure injury of skin 05/30/2017  . Malnutrition of moderate degree 05/30/2017  . Hip fx, right, closed, initial encounter ([place]) 05/29/2017  . Closed displaced intertrochanteric fracture of right femur ([place]) 05/29/2017  . Dysphagia 05/29/2017  . Weight loss 05/29/2017  . Difficult airway for intubation 05/29/2017  . Lesion of epiglottis 05/26/2014  . Abnormal CT scan, pancreas or bile duct   . Right leg DVT ([place]) 05/08/2014  . Anemia 01/09/2014  . Hip pain, right 05/06/2011  . 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 CKD (chronic kidney disease) stage 3, GFR 30-59 ml/min (HCC) 03/04/2011  . Diastolic dysfunction 40/09/6759  . Tobacco abuse 03/04/2011  . Constipation 03/04/2011  . Malignant hypertensive urgency 03/01/2011  . Cerebral infarction ([place]) 03/01/2011  . NEUROPATHY, IDIOPATHIC 10/25/2007  . Hypercholesteremia 12/15/2006  . Essential hypertension 12/15/2006   Past Medical History:  Past Medical History:  Diagnosis Date  . Acute gallstone pancreatitis 01/08/2014  . AKI (acute kidney injury) ([place]) 01/08/2014  . Anemia   . Blood transfusion    11'15 last admission  . Cancer ([place])   . Cholecystitis   . H/O tracheostomy   . Hip fracture ([place])   . Hypercholesteremia   . Hypertension   . Right leg DVT ([place]) 05/08/2014  . Septic shock - Kelbsiella 01/09/2014  . Shortness of breath   . Stroke [facility])    '13- right side weakness,  ambulates with cane   Past Surgical History:  Past Surgical History:  Procedure Laterality Date  . ABDOMINAL HYSTERECTOMY    . DIRECT LARYNGOSCOPY N/A 06/08/2017   Procedure: DIRECT LARYNGOSCOPY WITH BIOPSY;  Surgeon: Helayne Seminole, MD;  Location: [facility] OR;  Service: ENT;  Laterality: N/A;  . ENDOSCOPIC RETROGRADE CHOLANGIOPANCREATOGRAPHY (ERCP) WITH PROPOFOL N/A 01/11/2014   Procedure: ENDOSCOPIC RETROGRADE CHOLANGIOPANCREATOGRAPHY (ERCP) WITH PROPOFOL;  Surgeon: Gatha Mayer, MD;  Location: [place];  Service: Endoscopy;  Laterality: N/A;  . ERCP    . ERCP N/A 05/26/2014   Procedure: ENDOSCOPIC RETROGRADE CHOLANGIOPANCREATOGRAPHY (ERCP);  Surgeon: Gatha Mayer, MD;  Location: [facility] ENDOSCOPY;  Service: Endoscopy;  Laterality: N/A;  . ESOPHAGOGASTRODUODENOSCOPY (EGD) WITH PROPOFOL N/A 12/07/2014   Procedure: ESOPHAGOGASTRODUODENOSCOPY (EGD) WITH PROPOFOL;  Surgeon: Milus Banister, MD;  Location: [facility] ENDOSCOPY;  Service: Endoscopy;  Laterality: N/A;  . EUS N/A 12/07/2014   Procedure: UPPER ENDOSCOPIC ULTRASOUND (EUS) LINEAR;  Surgeon: Milus Banister, MD;  Location: [facility] ENDOSCOPY;  Service: Endoscopy;  Laterality: N/A;  . GASTROINTESTINAL STENT REMOVAL N/A 12/07/2014   Procedure: GASTROINTESTINAL STENT REMOVAL;  Surgeon: Milus Banister, MD;  Location: [facility] ENDOSCOPY;  Service: Endoscopy;  Laterality: N/A;  pancreatic stent removal  . HIP FRACTURE SURGERY    . INTRAMEDULLARY (IM) NAIL INTERTROCHANTERIC Right 05/29/2017   Procedure: INTRAMEDULLARY (IM) NAIL INTERTROCHANTRIC FEMUR;  Surgeon: Nicholes Stairs, MD;  Location: [place];  Service: Orthopedics;  Laterality: Right;  . PERCUTANEOUS TRACHEOSTOMY N/A 05/29/2017   Procedure: PERCUTANEOUS TRACHEOSTOMY;  Surgeon: Judeth Horn, MD;  Location: [place];  Service: General;  Laterality: N/A;  . TEE WITHOUT CARDIOVERSION  03/06/2011   Procedure: TRANSESOPHAGEAL ECHOCARDIOGRAM (TEE);  Surgeon: Jolaine Artist, MD;  Location: [facility] ENDOSCOPY;   Service: Cardiovascular;  Laterality: N/A;   HPI:  69 yo female adm to [facility] with respiratory failure.  Pt PMH + for epiglottic lesion noted in 2016, laryngeal cancer diagnosis in 2019 s/p laryngectomy and radical neck dissection completed at [facility] 07/2017.  She has h/o right leg DVT, COPD, Essential HTN, CVA 2013, fall with hip fx in 05/2017, ataxia, neuropathy and dysphagia.  Swallow evaluation ordered as MD concerned for pt having aspiration risk per notes. Pt used to use a Lary-tube but no longer uses it.    Swallow eval ordered.   Assessment / Plan / Recommendation Clinical Impression  SLP spent much of the visit reviewing options for communication including use of communication board, dry erase board provided by SLP, SLP large printed words of pt frequently needed items.  Also researched aug comm free apps on pt's tablet, pt did not desire the app SLP located.  Advised her that SLP would research to locate apps appropriate for her and will follow up next date.  Pt able to point to items she could need on her communication board and with written tips.    SLP Assessment  SLP Recommendation/Assessment: Patient needs continued Speech Lanaguage Pathology Services SLP Visit Diagnosis: Aphonia (R49.1)    Follow Up Recommendations  Other (comment) (tbd)    Frequency and Duration min 1 x/week  1 week      SLP Evaluation Cognition  Overall Cognitive Status: Difficult to assess Arousal/Alertness: Awake/alert Problem Solving: Appears intact (for basic daily needs)       Comprehension  Auditory Comprehension Overall Auditory Comprehension: Appears within functional limits for tasks assessed Yes/No Questions: Not tested Commands: Not tested Conversation: Simple Other Conversation Comments: for basic communication re: pt's swallowing and communication she is able to follow conversation Reading Comprehension Reading Status: Within funtional limits (for reading single words for food  items/comfort needs)    Expression Expression Primary Mode of Expression: Verbal Verbal Expression Overall Verbal Expression: Appears within functional limits for tasks assessed Initiation: No impairment Repetition: No impairment Naming: No impairment Pragmatics: No impairment Non-Verbal Means of Communication: Communication board Other Verbal Expression Comments: SLP spent much of the visit reviewing options for communication including use of communication board, dry erase board provided by SLP, SLP large printed words of pt frequently needed items.  Also researched aug comm free apps on pt's tablet, pt did not desire the app SLP located.  Advised her that SLP would research to locate apps appropriate for her and will follow up next date.  Pt able to point to items she could need on her communication board and with written tips. Written Expression Dominant Hand: Right Written Expression: Within Functional Limits (Pt able to print her name but when she attempts to write novel communication, she uses cursive and it's illegible.)   Oral / Motor  Oral Motor/Sensory Function Overall Oral Motor/Sensory Function: Within functional limits (pt does not have a larynx) Motor Speech Overall Motor Speech: Appears within functional limits for tasks assessed Articulation: Within functional limitis Motor Planning: Witnin functional limits Motor Speech Errors: Not applicable   GO                    Macario Golds 10/11/2019, 4:18 PM  Kathleen Lime, MS [place] Office [phone]

## 2019-10-11 NOTE — Progress Notes (Signed)
Physical Therapy Treatment Patient Details Name: Katelyn [name] MRN: [id number] DOB: Aug 18, 1950 Today's Date: 10/11/2019    History of Present Illness 69 yo female admitted with acute resp failure. Hx of throat ca, neuropathy, CHF, CVA, CKD, DVT, R hip fx, trach 2019    PT Comments    Pt agreeable to mobilize and required increased time and effort for bed mobility.  Pt attempted to get to EOB multiple times and declined more physical assist from therapist to get to EOB so assisted back to supine.  Pt prefers d/c home.  Palliative following.  Pt encouraged to participate as able to maintain strength and decrease burden of care at home.   Follow Up Recommendations  Home health PT;Supervision/Assistance - 24 hour     Equipment Recommendations  None recommended by PT    Recommendations for Other Services       Precautions / Restrictions Precautions Precautions: Fall Restrictions Weight Bearing Restrictions: No    Mobility  Bed Mobility Overal bed mobility: Needs Assistance Bed Mobility: Sit to Supine;Supine to Sit     Supine to sit: Mod assist Sit to supine: Max assist   General bed mobility comments: pt requiring increased time, attempts mobility and then stops, therapist offerred cues and assist however pt preferred to attempt on her own and then decided she was not able to sit EOB, briefly able to bring trunk upright with assist however did not fully sit at EOB; pt repositioned to comfort in supine; pt shakes head yes to fatigue, weakness, no energy as reasoning for inability to mobilize today  Transfers                    Ambulation/Gait                 Stairs             Wheelchair Mobility    Modified Rankin (Stroke Patients Only)       Balance                                            Cognition Arousal/Alertness: Awake/alert Behavior During Therapy: WFL for tasks assessed/performed Overall Cognitive Status:  Difficult to assess                                        Exercises      General Comments        Pertinent Vitals/Pain Pain Assessment: No/denies pain    Home Living                      Prior Function            PT Goals (current goals can now be found in the care plan section) Progress towards PT goals: Progressing toward goals    Frequency    Min 3X/week      PT Plan Current plan remains appropriate    Co-evaluation              AM-PAC PT "6 Clicks" Mobility   Outcome Measure  Help needed turning from your back to your side while in a flat bed without using bedrails?: A Little Help needed moving from lying on your back to sitting on the side of a flat bed  without using bedrails?: A Lot Help needed moving to and from a bed to a chair (including a wheelchair)?: A Lot Help needed standing up from a chair using your arms (e.g., wheelchair or bedside chair)?: A Lot Help needed to walk in hospital room?: A Lot Help needed climbing 3-5 steps with a railing? : A Lot 6 Click Score: 13    End of Session Equipment Utilized During Treatment: Oxygen Activity Tolerance: Patient tolerated treatment well Patient left: in bed;with family/visitor present Nurse Communication: Mobility status PT Visit Diagnosis: Muscle weakness (generalized) (M62.81)     Time: 6811-5726 PT Time Calculation (min) (ACUTE ONLY): 23 min  Charges:  $Therapeutic Activity: 8-22 mins                     Jannette Spanner PT, DPT Acute Rehabilitation Services Pager: [phone] Office: [phone]  York Ram E 10/11/2019, 12:41 PM

## 2019-10-11 NOTE — Progress Notes (Addendum)
PROGRESS NOTE    Katelyn [name]  BPZ:[id number] DOB: September 22, 1950 DOA: 10/06/2019 PCP: Aretta Nip, MD   Brief Narrative: Katelyn [name] is a 69 y.o. female with medical history significant of laryngeal cancer status post total laryngectomy status post neck dissection and  currently on a stoma, hypertension, hyperlipidemia, nonhemorrhagic CVA, right leg DVT, hip fracture, diastolic CHF who presents to the emergency department with complaint of shortness of breath and cough .  She was admitted for the management of possible COPD exacerbation.  Hospital course remarkable for persistent requirement of high amount of oxygen for maintenance of saturation.PCCM also consulted and following.  Palliative care consulted today for goals of care discussion due to her noncompliance, persistent oxygen requirement, high chance of decompensation in the near future.  Assessment & Plan:   Principal Problem:   Acute respiratory failure with hypoxia (HCC) Active Problems:   Hypercholesteremia   Essential hypertension   Cerebral infarction (HCC)   Diastolic dysfunction   Right leg DVT (HCC)   Generalized weakness   COPD exacerbation (HCC)     Acute respiratory failure with hypoxia and hypercarbia: Presented with severe shortness of breath, dry cough.  Most likely from COPD exacerbation/ frequent mucus plugging of the stoma/chronic thromboembolic disease.  She was on 5 L of oxygen initially per minute but oxygen requirement increased to 10 L/min..  We will try to wean the oxygen.  She needs humidification at home to prevent recurrent plugging. She cannot be discharged unless her oxygen requirement improves to at least 5 L/min.  Pneumonia: Chest x-ray done on 10/10/19 showed possibility of bilateral basal pneumonia which cud be from aspiration.  Started on Augmentin.  She is afebrile but patient had temperature fluctuated from 99 200.  We have ordered sputum cultures.  Will get speech therapy  evaluation.  COPD exacerbation: No documented history of COPD as per the chart.  Presented with dyspnea, cough. ABG showed hypercarbia.  Started on Solu-Medrol,now changed to prednisone. Completed course of  azithromycin.  She needs to follow-up with pulmonology as an outpatient for PFTs.  Continue bronchodilators.  Continue supplemental oxygen as needed.  Laryngeal cancer: Status post total laryngectomy, neck dissection.  Currently in remission.  She used  to follow-up with Dr. Vicie Mutters, ENT, at [facility].  Currently has a stoma only at her tracheostomy site.  We recommend to follow-up with ENT as an outpatient.  She is extremely noncompliant and has not taken any medications and has not seen a physician for a year.  Elevated D-dimer/history of right lower extremity DVT: Currently not on any medications. Daughter unsure about the previous medication she was taking .Venous Doppler of the bilateral lower extremities showed chronic  right lower extremity DVT. CT angiogram did not show any pulmonary embolism.  Since echocardiogram showed mildly elevated pulmonary artery pressure, VQ scan was done  which showed right mid lung perfusion defect laterally, for which pulmonary embolism cannot be excluded.  Started on Eliquis.  Chronic diastolic congestive heart failure: Echo done on this admission showed ejection fraction of 66 5%, grade 1 diastolic dysfunction.  Does not take any diuretics at home. Chest x-ray done on 10/07/2019 showed pulmonary vascular congestion, bibasilar airspace disease consistent with edema or infection.    Given a dose of Lasix IV 40 mg once.  BNP is normal so diuretics not continued.  She is euvolemic.  AKI: Most likely secondary to Lasix that was given once.  We will hold Lasix and continue to monitor BMP.AKI has  imroved  History of nonhemorrhagic stroke: Has mild right hemiparesis.  Does not take aspirin or Plavix at home.  Daughter states she was previously on aspirin  which was stopped by her previous providers.  Started on Eliquis here  Hyperlipidemia: Previously taking cholesterol medications but not taking anything at present.  LDL more than 200.  Started on Lipitor 80 mg daily.  Hypertension: Noted to be severely hypertensive on presentation.  Not taking any medication at home.  Continue as needed medications.  Started on amlodipine but now discontinued due to bradycardia and soft blood pressure.  Rash: Most likely fungal rash beneath her breasts.  Continue nystatin  Anxiety/panic attack: Continue Xanax as needed.  Generalized weakness: Has gait problem.  Ambulates minimally with the help of walker.    PT/OT recommend home health.     DVT prophylaxis:Lovenox Code Status: Full Family Communication: Discussed with daughter on phone on 10/11/2019  Status is: Inpatient  Remains inpatient appropriate because:Hemodynamically unstable   Dispo: The patient is from: Home              Anticipated d/c is to: Home              Anticipated d/c date is: 1 to 2 days              Patient currently is not medically stable to d/c. Still requiring 10 L of oxygen per minute.  Oxygen requirements showed lower at least to 5 L/min for discharge planning.  Palliative care also consulted.    Consultants: PCCM  Procedures:None  Antimicrobials:  Anti-infectives (From admission, onward)   Start     Dose/Rate Route Frequency Ordered Stop   10/10/19 1245  amoxicillin-clavulanate (AUGMENTIN) 875-125 MG per tablet 1 tablet     Discontinue     1 tablet Oral Every 12 hours 10/10/19 1149     10/06/19 1930  azithromycin (ZITHROMAX) tablet 500 mg        500 mg Oral Daily 10/06/19 1807 10/08/19 1105      Subjective:  Patient seen and examined the bedside this morning.  Hemodynamically  stable.  Listening to music but her oxygen requirement is still 7 to 10 L/min.  Objective: Vitals:   10/10/19 2118 10/10/19 2241 10/11/19 0316 10/11/19 0410  BP: (!) 146/100    109/76  Pulse: 99  76 78  Resp: (!) 28 (!) 22 (!) 24 (!) 26  Temp: 99.1 F (37.3 C)   99.2 F (37.3 C)  TempSrc: Oral   Oral  SpO2: 93%  93% 91%  Weight:      Height:        Intake/Output Summary (Last 24 hours) at 10/11/2019 0759 Last data filed at 10/11/2019 0425 Gross per 24 hour  Intake 240 ml  Output 475 ml  Net -235 ml   Filed Weights   10/06/19 2140 10/07/19 1402  Weight: 65.8 kg 61.3 kg    Examination:  General exam: Deconditioned,debilitated HEENT:Tracheostomy Respiratory system: Bilateral diminished air sounds Cardiovascular system: S1 & S2 heard, RRR. No JVD, murmurs, rubs, gallops or clicks. Gastrointestinal system: Abdomen is nondistended, soft and nontender. No organomegaly or masses felt. Normal bowel sounds heard. Central nervous system: Alert and oriented. No focal neurological deficits. Extremities: No edema, no clubbing ,no cyanosis, distal peripheral pulses palpable. Skin: No rashes, lesions or ulcers,no icterus ,no pallor     Data Reviewed: I have personally reviewed following labs and imaging studies  CBC: Recent Labs  Lab 10/06/19 0919 10/07/19 0431  10/10/19 0528 10/11/19 0546  WBC 7.7 7.0 12.7* 11.1*  NEUTROABS 5.9  --  9.5* 9.4*  HGB 10.5* 12.9 13.2 12.4  HCT 33.8* 41.7 44.4 41.5  MCV 93.6 92.1 95.7 95.6  PLT 137* 168 186 332   Basic Metabolic Panel: Recent Labs  Lab 10/06/19 0919 10/07/19 0431 10/08/19 0520 10/09/19 0523 10/10/19 0528  NA 141 139 134* 139 139  K 3.4* 4.2 3.9 4.2 4.2  CL 108 105 100 103 103  CO2 25 24 23 25 25   GLUCOSE 89 157* 125* 114* 103*  BUN 18 29* 46* 44* 40*  CREATININE 0.84 1.16* 1.46* 1.24* 1.26*  CALCIUM 8.4* 9.7 9.3 9.4 9.1   GFR: Estimated Creatinine Clearance: 33.1 mL/min (A) (by C-G formula based on SCr of 1.26 mg/dL (H)). Liver Function Tests: Recent Labs  Lab 10/06/19 0919  AST 21  ALT 14  ALKPHOS 91  BILITOT 0.7  PROT 6.9  ALBUMIN 3.6   No results for input(s): LIPASE, AMYLASE  in the last 168 hours. No results for input(s): AMMONIA in the last 168 hours. Coagulation Profile: No results for input(s): INR, PROTIME in the last 168 hours. Cardiac Enzymes: No results for input(s): CKTOTAL, CKMB, CKMBINDEX, TROPONINI in the last 168 hours. BNP (last 3 results) No results for input(s): PROBNP in the last 8760 hours. HbA1C: No results for input(s): HGBA1C in the last 72 hours. CBG: No results for input(s): GLUCAP in the last 168 hours. Lipid Profile: No results for input(s): CHOL, HDL, LDLCALC, TRIG, CHOLHDL, LDLDIRECT in the last 72 hours. Thyroid Function Tests: No results for input(s): TSH, T4TOTAL, FREET4, T3FREE, THYROIDAB in the last 72 hours. Anemia Panel: No results for input(s): VITAMINB12, FOLATE, FERRITIN, TIBC, IRON, RETICCTPCT in the last 72 hours. Sepsis Labs: Recent Labs  Lab 10/06/19 0919  PROCALCITON <0.10    Recent Results (from the past 240 hour(s))  SARS Coronavirus 2 by RT PCR (hospital order, performed in [facility] hospital lab) Nasopharyngeal Nasopharyngeal Swab     Status: None   Collection Time: 10/06/19  9:28 AM   Specimen: Nasopharyngeal Swab  Result Value Ref Range Status   SARS Coronavirus 2 NEGATIVE NEGATIVE Final    Comment: (NOTE) SARS-CoV-2 target nucleic acids are NOT DETECTED.  The SARS-CoV-2 RNA is generally detectable in upper and lower respiratory specimens during the acute phase of infection. The lowest concentration of SARS-CoV-2 viral copies this assay can detect is 250 copies / mL. A negative result does not preclude SARS-CoV-2 infection and should not be used as the sole basis for treatment or other patient management decisions.  A negative result may occur with improper specimen collection / handling, submission of specimen other than nasopharyngeal swab, presence of viral mutation(s) within the areas targeted by this assay, and inadequate number of viral copies (<250 copies / mL). A negative result must be  combined with clinical observations, patient history, and epidemiological information.  Fact Sheet for Patients:   StrictlyIdeas.no  Fact Sheet for Healthcare Providers: BankingDealers.co.za  This test is not yet approved or  cleared by the [place] FDA and has been authorized for detection and/or diagnosis of SARS-CoV-2 by FDA under an Emergency Use Authorization (EUA).  This EUA will remain in effect (meaning this test can be used) for the duration of the COVID-19 declaration under Section 564(b)(1) of the Act, 21 U.S.C. section 360bbb-3(b)(1), unless the authorization is terminated or revoked sooner.  Performed at [facility], [place] [address] [place], [place] [address]  Radiology Studies: NM Pulmonary Perfusion  Result Date: 10/09/2019 CLINICAL DATA:  Respiratory failure EXAM: NUCLEAR MEDICINE PERFUSION LUNG SCAN TECHNIQUE: Perfusion images were obtained in multiple projections after intravenous injection of radiopharmaceutical. Ventilation scans intentionally deferred if perfusion scan and chest x-ray adequate for interpretation during COVID 19 epidemic. RADIOPHARMACEUTICALS:  4.2 mCi Tc-59m MAA IV COMPARISON:  chest radiograph of 10/09/2019 FINDINGS: Right mid lung perfusion defect laterally is somewhat wedge-shaped, and is without correlate on chest radiograph. The left lung perfuses normally. IMPRESSION: Right mid lung perfusion defect laterally, for which pulmonary embolism cannot be excluded. Consider lower extremity ultrasound or if feasible, CTA of the chest. Electronically Signed   By: Abigail Miyamoto M.D.   On: 10/09/2019 12:09   DG CHEST PORT 1 VIEW  Result Date: 10/10/2019 CLINICAL DATA:  Fever EXAM: PORTABLE CHEST 1 VIEW COMPARISON:  October 09, 2018 FINDINGS: Apparent tracheostomy. Postoperative changes noted in the neck and upper thoracic regions. No evident pneumothorax there is new  atelectatic change in the lung bases with ill-defined airspace opacity in these areas and questionable small right pleural effusion. Lungs elsewhere are clear. Heart size and pulmonary vascular normal. No adenopathy. There is aortic atherosclerosis. No adenopathy. No bone lesions. IMPRESSION: Atelectatic change in the lung bases with suspected developing pneumonia bilaterally in these areas. Questionable small right pleural effusion. Stable cardiac silhouette. Postoperative change in the neck and upper chest region with apparent tracheostomy. No pneumothorax. Aortic Atherosclerosis (ICD10-I70.0). Electronically Signed   By: Lowella Grip III M.D.   On: 10/10/2019 09:19   DG CHEST PORT 1 VIEW  Result Date: 10/09/2019 CLINICAL DATA:  Hypoxia prior to V/Q scan. EXAM: PORTABLE CHEST 1 VIEW COMPARISON:  10/07/2019 FINDINGS: No focal consolidation. No pleural effusion or pneumothorax. Stable cardiomegaly. No acute osseous abnormality. IMPRESSION: No active disease. Electronically Signed   By: Kathreen Devoid   On: 10/09/2019 12:57        Scheduled Meds: . amLODipine  10 mg Oral Daily  . amoxicillin-clavulanate  1 tablet Oral Q12H  . apixaban  5 mg Oral BID  . atorvastatin  80 mg Oral Daily  . guaiFENesin-dextromethorphan  10 mL Oral Q6H  . ipratropium-albuterol  3 mL Nebulization BID  . mouth rinse  15 mL Mouth Rinse BID  . metoprolol tartrate  25 mg Oral BID  . nystatin cream   Topical TID  . predniSONE  40 mg Oral Q breakfast  . sodium chloride HYPERTONIC  4 mL Nebulization BID   Continuous Infusions:   LOS: 5 days    Time spent: 35 mins.More than 50% of that time was spent in counseling and/or coordination of care.      Shelly Coss, MD [facility] P8/17/2021, 7:59 AM

## 2019-10-12 DIAGNOSIS — J9601 Acute respiratory failure with hypoxia: Secondary | ICD-10-CM | POA: Diagnosis not present

## 2019-10-12 LAB — BASIC METABOLIC PANEL
Anion gap: 10 (ref 5–15)
BUN: 51 mg/dL — ABNORMAL HIGH (ref 8–23)
CO2: 25 mmol/L (ref 22–32)
Calcium: 8.8 mg/dL — ABNORMAL LOW (ref 8.9–10.3)
Chloride: 106 mmol/L (ref 98–111)
Creatinine, Ser: 1.32 mg/dL — ABNORMAL HIGH (ref 0.44–1.00)
GFR calc Af Amer: 47 mL/min — ABNORMAL LOW (ref >60)
GFR calc non Af Amer: 41 mL/min — ABNORMAL LOW (ref >60)
Glucose, Bld: 112 mg/dL — ABNORMAL HIGH (ref 70–99)
Potassium: 4 mmol/L (ref 3.5–5.1)
Sodium: 141 mmol/L (ref 135–145)

## 2019-10-12 MED ORDER — POLYETHYLENE GLYCOL 3350 17 G PO PACK
17.0000 g | PACK | Freq: Every day | ORAL | Status: DC
  Administered 2019-10-12 – 2019-10-18 (×7): 17 g via ORAL
  Filled 2019-10-12 (×7): qty 1

## 2019-10-12 NOTE — Progress Notes (Signed)
PROGRESS NOTE    Katelyn [name]  ZOX:[id number] DOB: April 29, 1950 DOA: 10/06/2019 PCP: Katelyn Nip, MD   Brief Narrative: Katelyn [name] is a 69 y.o. female with medical history significant of laryngeal cancer status post total laryngectomy status post neck dissection and  currently on a stoma, hypertension, hyperlipidemia, nonhemorrhagic CVA, right leg DVT, hip fracture, diastolic CHF who presents to the emergency department with complaint of shortness of breath and cough .  She was admitted for the management of possible COPD exacerbation.  Hospital course remarkable for persistent requirement of high amount of oxygen for maintenance of saturation.PCCM also consulted and following.  Palliative care consulted  for goals of care discussion due to her noncompliance, persistent oxygen requirement, high chance of decompensation in the near future.  Assessment & Plan:   Principal Problem:   Acute respiratory failure with hypoxia (HCC) Active Problems:   Hypercholesteremia   Essential hypertension   Cerebral infarction (HCC)   Diastolic dysfunction   Right leg DVT (HCC)   Generalized weakness   COPD exacerbation (HCC)   History of noncompliance with medical treatment     Acute respiratory failure with hypoxia and hypercarbia: Presented with severe shortness of breath, dry cough.  Most likely from COPD exacerbation/ frequent mucus plugging of the stoma/chronic thromboembolic disease.  She was on 5 L of oxygen initially per minute but oxygen requirement increased to 10 L/min..  We will continue to try to wean the oxygen.  She needs humidification at home to prevent recurrent plugging.Home DME ordered She cannot be discharged unless her oxygen requirement improves to at least 5-6 L/min.  Pneumonia: Chest x-ray done on 10/10/19 showed possibility of bilateral basal pneumonia which cud be from aspiration.  Started on Augmentin.  She is afebrile .  We have ordered sputum cultures.  Speech  therapy also consulted.  COPD exacerbation: No documented history of COPD as per the chart.  Presented with dyspnea, cough. ABG showed hypercarbia.  Started on Solu-Medrol,now changed to prednisone. Completed course of  azithromycin.  She needs to follow-up with pulmonology as an outpatient for PFTs.  Continue bronchodilators.  Continue supplemental oxygen as needed.  Laryngeal cancer: Status post total laryngectomy, neck dissection.  Currently in remission.  She used  to follow-up with Dr. Vicie [name], ENT, at [facility].  Currently has a stoma only at her tracheostomy site.  We recommend to follow-up with ENT as an outpatient.  She is extremely noncompliant and has not taken any medications and has not seen a physician for a year.  Elevated D-dimer/history of right lower extremity DVT: Currently not on any medications. Daughter unsure about the previous medication she was taking .Venous Doppler of the bilateral lower extremities showed chronic  right lower extremity DVT. CT angiogram did not show any pulmonary embolism.  Since echocardiogram showed mildly elevated pulmonary artery pressure, VQ scan was done  which showed right mid lung perfusion defect laterally, for which pulmonary embolism cannot be excluded.  Started on Eliquis.  Chronic diastolic congestive heart failure: Echo done on this admission showed ejection fraction of 66 5%, grade 1 diastolic dysfunction.  Does not take any diuretics at home. Chest x-ray done on 10/07/2019 showed pulmonary vascular congestion, bibasilar airspace disease consistent with edema or infection.    Given a dose of Lasix IV 40 mg once.  BNP is normal so diuretics not continued.  She is euvolemic.  AKI: Most likely secondary to Lasix that was given once.  We will hold Lasix and continue  to monitor BMP.AKI has imroved  History of nonhemorrhagic stroke: Has mild right hemiparesis.  Does not take aspirin or Plavix at home.  Daughter states she was previously  on aspirin which was stopped by her previous providers.  Started on Eliquis here  Hyperlipidemia: Previously taking cholesterol medications but not taking anything at present.  LDL more than 200.  Started on Lipitor 80 mg daily.  Hypertension: Noted to be severely hypertensive on presentation.  Not taking any medication at home.  Continue as needed medications.  Started on amlodipine but now discontinued due to bradycardia and soft blood pressure.  Rash: Most likely fungal rash beneath her breasts.  Continue nystatin  Anxiety/panic attack: Continue Xanax as needed.  Generalized weakness: Has gait problem.  Ambulates minimally with the help of walker.    PT/OT recommend home health.     DVT prophylaxis:Lovenox Code Status: Full Family Communication: Extensively discussed with the daughter at the bedside.  For updates,call daughter Katelyn [name]  Status is: Inpatient  Remains inpatient appropriate because:Hemodynamically unstable   Dispo: The patient is from: Home              Anticipated d/c is to: Home              Anticipated d/c date is:2-3 days              Patient currently is not medically stable to d/c. Still requiring 10 L of oxygen per minute.  Oxygen requirements showed lower at least to 5 L/min for discharge planning.     Consultants: PCCM  Procedures:None  Antimicrobials:  Anti-infectives (From admission, onward)   Start     Dose/Rate Route Frequency Ordered Stop   10/10/19 1245  amoxicillin-clavulanate (AUGMENTIN) 875-125 MG per tablet 1 tablet     Discontinue     1 tablet Oral Every 12 hours 10/10/19 1149     10/06/19 1930  azithromycin (ZITHROMAX) tablet 500 mg        500 mg Oral Daily 10/06/19 1807 10/08/19 1105      Subjective:  Patient seen and examined the bedside today.  Still on 10 L of oxygen per minute.  At rest, she looks comfortable but she also is coughing.  Daughter was present at the bedside.  Cudnt  discharge because of high oxygen  requirement.  Also talked to pulmonology who will continue to follow.  Objective: Vitals:   10/12/19 0042 10/12/19 0320 10/12/19 0535 10/12/19 0812  BP:   114/78   Pulse: 78 74 93   Resp: 20 20 20    Temp:   (!) 97.5 F (36.4 C)   TempSrc:   Oral   SpO2: 96% 98% (!) 89% 96%  Weight:      Height:        Intake/Output Summary (Last 24 hours) at 10/12/2019 0843 Last data filed at 10/11/2019 1830 Gross per 24 hour  Intake 360 ml  Output 400 ml  Net -40 ml   Filed Weights   10/06/19 2140 10/07/19 1402  Weight: 65.8 kg 61.3 kg    Examination:   General exam: Chronically ill looking, deconditioned debilitated HEENT: Tracheostomy Respiratory system: Bilateral diminished breath sounds  cardiovascular system: S1 & S2 heard, RRR. No JVD, murmurs, rubs, gallops or clicks. Gastrointestinal system: Abdomen is nondistended, soft and nontender. No organomegaly or masses felt. Normal bowel sounds heard. Central nervous system: Alert and oriented. No focal neurological deficits. Extremities: No edema, no clubbing ,no cyanosis Skin: No ulcers,no icterus ,no pallor  Data Reviewed: I have personally reviewed following labs and imaging studies  CBC: Recent Labs  Lab 10/06/19 0919 10/07/19 0431 10/10/19 0528 10/11/19 0546  WBC 7.7 7.0 12.7* 11.1*  NEUTROABS 5.9  --  9.5* 9.4*  HGB 10.5* 12.9 13.2 12.4  HCT 33.8* 41.7 44.4 41.5  MCV 93.6 92.1 95.7 95.6  PLT 137* 168 186 440   Basic Metabolic Panel: Recent Labs  Lab 10/07/19 0431 10/08/19 0520 10/09/19 0523 10/10/19 0528 10/12/19 0544  NA 139 134* 139 139 141  K 4.2 3.9 4.2 4.2 4.0  CL 105 100 103 103 106  CO2 24 23 25 25 25   GLUCOSE 157* 125* 114* 103* 112*  BUN 29* 46* 44* 40* 51*  CREATININE 1.16* 1.46* 1.24* 1.26* 1.32*  CALCIUM 9.7 9.3 9.4 9.1 8.8*   GFR: Estimated Creatinine Clearance: 31.6 mL/min (A) (by C-G formula based on SCr of 1.32 mg/dL (H)). Liver Function Tests: Recent Labs  Lab 10/06/19 0919  AST  21  ALT 14  ALKPHOS 91  BILITOT 0.7  PROT 6.9  ALBUMIN 3.6   No results for input(s): LIPASE, AMYLASE in the last 168 hours. No results for input(s): AMMONIA in the last 168 hours. Coagulation Profile: No results for input(s): INR, PROTIME in the last 168 hours. Cardiac Enzymes: No results for input(s): CKTOTAL, CKMB, CKMBINDEX, TROPONINI in the last 168 hours. BNP (last 3 results) No results for input(s): PROBNP in the last 8760 hours. HbA1C: No results for input(s): HGBA1C in the last 72 hours. CBG: No results for input(s): GLUCAP in the last 168 hours. Lipid Profile: No results for input(s): CHOL, HDL, LDLCALC, TRIG, CHOLHDL, LDLDIRECT in the last 72 hours. Thyroid Function Tests: No results for input(s): TSH, T4TOTAL, FREET4, T3FREE, THYROIDAB in the last 72 hours. Anemia Panel: No results for input(s): VITAMINB12, FOLATE, FERRITIN, TIBC, IRON, RETICCTPCT in the last 72 hours. Sepsis Labs: Recent Labs  Lab 10/06/19 0919  PROCALCITON <0.10    Recent Results (from the past 240 hour(s))  SARS Coronavirus 2 by RT PCR (hospital order, performed in [facility] hospital lab) Nasopharyngeal Nasopharyngeal Swab     Status: None   Collection Time: 10/06/19  9:28 AM   Specimen: Nasopharyngeal Swab  Result Value Ref Range Status   SARS Coronavirus 2 NEGATIVE NEGATIVE Final    Comment: (NOTE) SARS-CoV-2 target nucleic acids are NOT DETECTED.  The SARS-CoV-2 RNA is generally detectable in upper and lower respiratory specimens during the acute phase of infection. The lowest concentration of SARS-CoV-2 viral copies this assay can detect is 250 copies / mL. A negative result does not preclude SARS-CoV-2 infection and should not be used as the sole basis for treatment or other patient management decisions.  A negative result may occur with improper specimen collection / handling, submission of specimen other than nasopharyngeal swab, presence of viral mutation(s) within the areas  targeted by this assay, and inadequate number of viral copies (<250 copies / mL). A negative result must be combined with clinical observations, patient history, and epidemiological information.  Fact Sheet for Patients:   StrictlyIdeas.no  Fact Sheet for Healthcare Providers: BankingDealers.co.za  This test is not yet approved or  cleared by the [place] FDA and has been authorized for detection and/or diagnosis of SARS-CoV-2 by FDA under an Emergency Use Authorization (EUA).  This EUA will remain in effect (meaning this test can be used) for the duration of the COVID-19 declaration under Section 564(b)(1) of the Act, 21 U.S.C. section 360bbb-3(b)(1), unless  the authorization is terminated or revoked sooner.  Performed at [facility], [place] [address], [place], [place] [address]          Radiology Studies: DG CHEST PORT 1 VIEW  Result Date: 10/10/2019 CLINICAL DATA:  Fever EXAM: PORTABLE CHEST 1 VIEW COMPARISON:  October 09, 2018 FINDINGS: Apparent tracheostomy. Postoperative changes noted in the neck and upper thoracic regions. No evident pneumothorax there is new atelectatic change in the lung bases with ill-defined airspace opacity in these areas and questionable small right pleural effusion. Lungs elsewhere are clear. Heart size and pulmonary vascular normal. No adenopathy. There is aortic atherosclerosis. No adenopathy. No bone lesions. IMPRESSION: Atelectatic change in the lung bases with suspected developing pneumonia bilaterally in these areas. Questionable small right pleural effusion. Stable cardiac silhouette. Postoperative change in the neck and upper chest region with apparent tracheostomy. No pneumothorax. Aortic Atherosclerosis (ICD10-I70.0). Electronically Signed   By: Lowella Grip III M.D.   On: 10/10/2019 09:19        Scheduled Meds: . amoxicillin-clavulanate  1 tablet Oral Q12H  .  apixaban  5 mg Oral BID  . atorvastatin  80 mg Oral Daily  . guaiFENesin-dextromethorphan  10 mL Oral Q6H  . ipratropium-albuterol  3 mL Nebulization BID  . mouth rinse  15 mL Mouth Rinse BID  . metoprolol tartrate  25 mg Oral BID  . nystatin cream   Topical TID  . predniSONE  40 mg Oral Q breakfast  . sodium chloride HYPERTONIC  4 mL Nebulization BID   Continuous Infusions:   LOS: 6 days    Time spent: 35 mins.More than 50% of that time was spent in counseling and/or coordination of care.      Shelly Coss, MD [facility] P8/18/2021, 8:43 AM

## 2019-10-12 NOTE — Progress Notes (Signed)
Occupational Therapy Treatment Patient Details Name: Katelyn [name] MRN: [id number] DOB: 1951/02/01 Today's Date: 10/12/2019    History of present illness 69 yo female admitted with acute resp failure. Hx of throat ca, neuropathy, CHF, CVA, CKD, DVT, R hip fx, trach 2019   OT comments  Treatment focused on improving functional mobility and activity tolerance needed in preparation for ADLs. Patient supine in bed and required max assist for supine to sit and sit to supine. Patient leaning heavily to the left at edge of bed and required mod assist patient to midline but patient unable to maintain position without assistance. Stood x 2 but patient unable to achieve erect posture - maintaining forward flexed position. Patient communication limited by trach and therapist unable to assess patient's status. O2 sat and HR WFL but patient not answering questions in a timely manner in regards regards to how she felt therefore attempt to transfer to chair aborted and patient returned to supine. Patient requiring significant assistance for transfers and ADLs. Therapist recommends SNF at discharge though patient adamant about going home. If she returns home she needs 24/7 assistance.    Follow Up Recommendations  SNF    Equipment Recommendations  None recommended by OT (Defer to next venue)    Recommendations for Other Services      Precautions / Restrictions Precautions Precautions: Fall Restrictions Weight Bearing Restrictions: No       Mobility Bed Mobility Overal bed mobility: Needs Assistance Bed Mobility: Sit to Supine;Supine to Sit     Supine to sit: Max assist Sit to supine: Max assist   General bed mobility comments: Patient max assist for supine to sit today needing assistance for trunk lift off and LEs. Patient leaning excessively to the left with therapist providing mod assist to position her at midline x2. Max assist to return to supine for trunk negotiation and  LEs.  Transfers Overall transfer level: Needs assistance Equipment used: Rolling walker (2 wheeled) Transfers: Sit to/from Stand Sit to Stand: Min assist         General transfer comment: Min assist to stand from bed but patient unabel to take step. Patient maintained a forward flexed position and unable to stand erect. Patient attempting to grasp at hand holds and therapist had patient return to seated position. Attempted second stand with walker and patient once again flexed over walker. Therapist determine transfer too unsafe and patient returned to supine.    Balance Overall balance assessment: Needs assistance Sitting-balance support: Bilateral upper extremity supported;Feet supported Sitting balance-Leahy Scale: Poor                                     ADL either performed or assessed with clinical judgement   ADL                       Lower Body Dressing: Total assistance Lower Body Dressing Details (indicate cue type and reason): Total assist to donn shoes.                     Vision   Vision Assessment?: No apparent visual deficits   Perception     Praxis      Cognition Arousal/Alertness: Awake/alert Behavior During Therapy: WFL for tasks assessed/performed Overall Cognitive Status: Difficult to assess  Exercises     Shoulder Instructions       General Comments      Pertinent Vitals/ Pain       Pain Assessment: No/denies pain  Home Living                                          Prior Functioning/Environment              Frequency  Min 2X/week        Progress Toward Goals  OT Goals(current goals can now be found in the care plan section)  Progress towards OT goals: Not progressing toward goals - comment (decreased ability to assist with mobility)  Acute Rehab OT Goals Patient Stated Goal: Continues to state she wants to go  home OT Goal Formulation: With patient Time For Goal Achievement: 10/23/19 Potential to Achieve Goals: [place] Discharge plan remains appropriate    Co-evaluation                 AM-PAC OT "6 Clicks" Daily Activity     Outcome Measure   Help from another person eating meals?: None Help from another person taking care of personal grooming?: A Little Help from another person toileting, which includes using toliet, bedpan, or urinal?: Total Help from another person bathing (including washing, rinsing, drying)?: A Lot Help from another person to put on and taking off regular upper body clothing?: A Lot Help from another person to put on and taking off regular lower body clothing?: Total 6 Click Score: 13    End of Session Equipment Utilized During Treatment: Oxygen;Rolling walker  OT Visit Diagnosis: Unsteadiness on feet (R26.81);Muscle weakness (generalized) (M62.81)   Activity Tolerance Patient limited by fatigue   Patient Left in bed;with call bell/phone within reach;with bed alarm set   Nurse Communication  (Needs changing)        Time: 1610-9604 OT Time Calculation (min): 22 min  Charges: OT General Charges $OT Visit: 1 Visit OT Treatments $Therapeutic Activity: 8-22 mins  Derl Barrow, OTR/L [place]  Office [phone] Pager: [place] 10/12/2019, 5:45 PM

## 2019-10-12 NOTE — Progress Notes (Signed)
NAME:  Katelyn [name], MRN:  [id number], DOB:  [birth year], LOS: 6 ADMISSION DATE:  10/06/2019, CONSULTATION DATE: 8/12 REFERRING MD:  Tawanna Solo, CHIEF COMPLAINT:  Hypoxia    Brief History   69 year old female with complicated medical history including prior laryngectomy and radical neck from laryngeal cancer, prior smoker.  Diastolic heart dysfunction.  Admitted with approximate 2-3 day history of cough and shortness of breath.  Found to be hypoxic, and hypercarbic in the emergency room.  Received 1st COVID shot 10 days prior to admit.    Arterial blood gas demonstrated hypercarbic respiratory failure.  A CT chest was obtained this was negative for pulmonary emboli no significant pulmonary edema or atelectasis.  She did have a very small hiatal hernia.  She is admitted with a working diagnosis of COPD exacerbation as a diagnosis of exclusion.  Pulmonary asked to evaluate to better define the etiology of her hypoxia and make further recommendations.  Past Medical History  Prior laryngectomy and radical neck, secondary to laryngeal cancer, hypertension, hyperlipidemia, prior nonhemorrhagic CVA, right leg deep vein thrombosis, hip fracture, diastolic heart failure.  Significant Hospital Events   8/12 Admit  8/16 Episode of mucus plugging, increased O2 needs   Consults:  PCCM 8/13  Palliative Care 8/17   Procedures:    Significant Diagnostic Tests:  CT chest 8/12 >> negative for pulmonary emboli, pulmonary edema, atelectasis, pneumonia or pleural effusions LE Venous Duplex 8/13 >> chronic DVT in the right femoral vein, right popliteal vein, negative left  ECHO 8/13 >> LVEF 60-65%, no RWMA, mild LVH, grade I diastolic dysfunction, RVS function normal VQ Scan 8/15 >> right mid lung perfusion defect laterally, PE can not be excluded   Micro Data:  COVID 8/12 >> negative   Antimicrobials:  Azithromycin 8/12 >>  Interim history/subjective:   No acute events.  Remains on 10 L, 60% FiO2  oxygen via trach mask  Objective   Blood pressure 135/87, pulse 90, temperature 98.3 F (36.8 C), temperature source Oral, resp. rate (!) 24, height 4\' 11"  (1.499 m), weight 61.3 kg, SpO2 96 %.    FiO2 (%):  [60 %-98 %] 60 %   Intake/Output Summary (Last 24 hours) at 10/12/2019 1521 Last data filed at 10/11/2019 1830 Gross per 24 hour  Intake 240 ml  Output 400 ml  Net -160 ml   Filed Weights   10/06/19 2140 10/07/19 1402  Weight: 65.8 kg 61.3 kg    Examination: Gen:      No acute distress, chronically ill-appearing HEENT:  EOMI, sclera anicteric Neck:     No masses; no thyromegaly, trach stoma clean and dry with no discharge. Lungs:    Clear lung sounds, no wheeze, crackles noted CV:         Regular rate and rhythm; no murmurs Abd:      + bowel sounds; soft, non-tender; no palpable masses, no distension Ext:    No edema; adequate peripheral perfusion Skin:      Warm and dry; no rash Neuro: Awake, oriented  Resolved Hospital Problem list     Assessment & Plan:   Acute hypoxic and hypercarbic respiratory failure  Purulent bronchitis  Bronchospasm  Mucous plugging  Possible COPD exacerbation  Laryngectomy in setting of prior radical neck for laryngeal cancer Deconditioning Diastolic heart failure currently compensated Chronic RLE DVT    Acute hypoxic and hypercarbic respiratory failure in setting of purulent bronchitis, mucous plugging, +/- COPD exacerbation and questionable PE  / new finding of chronic RLE  DVT.   She presumably has baseline COPD as she is a prior smoker.  Unable to do PFTs. CTA chest motion degraded / only large vessels ruled out.  VQ with wedge shaped defect on right which raises concern for possible PE.   -We have started Eliquis given chronic lower extremity DVT and possible PE on VQ scan.  She may have chronic thromboembolic disease Continue duo nebs, pulmonary hygiene, hypertonic saline NT suctioning as needed Steroid taper, intermittent chest  x-ray Wean down oxygen as tolerated  Goals of Care  Appreciate palliative care input.  Ongoing discussions on goals of care as she has not kept up with her appointments and has been noncompliant with medications for some time  Best practice:  Per primary  Critical care time: n/a    Marshell Garfinkel MD [facility] Please see [email or web address] for pager details.  10/12/2019, 3:21 PM

## 2019-10-12 NOTE — TOC Initial Note (Addendum)
Transition of Care [facility]) - Initial/Assessment Note    Patient Details  Name: Katelyn [name] MRN: [id number] Date of Birth: 1950-06-10  Transition of Care [facility]) CM/SW Contact:    Dessa Phi, RN Phone Number: 10/12/2019, 11:19 AM  Clinical Narrative: Spoke to grand dtr [name]-d/c plan home w/HHC-[facility] chosen rep Santiago Glad following-HHC ordered;Home 02 ordered-[facility] following-Noted currently on 02 10liters.Will need 02 sats documented witin 24-48hrs of d/c to confirm qualifying for home 02.Monitoring 02 for home. Family able to transport home on own.                 Expected Discharge Plan: [place] Barriers to Discharge: Continued Medical Work up   Patient Goals and CMS Choice Patient states their goals for this hospitalization and ongoing recovery are:: go home CMS Medicare.gov Compare Post Acute Care list provided to:: Patient Represenative (must comment) (grand dtr [name]) Choice offered to / list presented to : Adult Children  Expected Discharge Plan and Services Expected Discharge Plan: [place]   Discharge Planning Services: CM Consult Post Acute Care Choice: Durable Medical Equipment Living arrangements for the past 2 months: Single Family Home                           HH Arranged: RN, PT, OT [facility] Agency: [place] ([facility]) Date [place]: 10/12/19 Time [place] 1118 Representative spoke with at [place]: Santiago Glad  Prior Living Arrangements/Services Living arrangements for the past 2 months: [place] Lives with:: Adult Children Patient language and need for interpreter reviewed:: Yes Do you feel safe going back to the place where you live?: Yes      Need for Family Participation in Patient Care: No (Comment) Care giver support system in place?: Yes (comment) Current home services: DME (rw) Criminal Activity/Legal Involvement Pertinent to Current Situation/Hospitalization: No  - Comment as needed  Activities of Daily Living Home Assistive Devices/Equipment: Nebulizer, Environmental consultant (specify type) (4 wheeled walker) ADL Screening (condition at time of admission) Patient's cognitive ability adequate to safely complete daily activities?: Yes Is the patient deaf or have difficulty hearing?: No Does the patient have difficulty seeing, even when wearing glasses/contacts?: No Does the patient have difficulty concentrating, remembering, or making decisions?: No Patient able to express need for assistance with ADLs?: Yes Does the patient have difficulty dressing or bathing?: Yes Independently performs ADLs?: No Communication: Independent Dressing (OT): Needs assistance Is this a change from baseline?: Pre-admission baseline Grooming: Needs assistance Is this a change from baseline?: Pre-admission baseline Feeding: Needs assistance Is this a change from baseline?: Pre-admission baseline Bathing: Needs assistance Is this a change from baseline?: Pre-admission baseline Toileting: Needs assistance Is this a change from baseline?: Pre-admission baseline In/Out Bed: Needs assistance Is this a change from baseline?: Pre-admission baseline Walks in Home: Needs assistance Is this a change from baseline?: Pre-admission baseline Does the patient have difficulty walking or climbing stairs?: Yes (secondary to weakness) Weakness of Legs: Both Weakness of Arms/Hands: Both  Permission Sought/Granted Permission sought to share information with : Case Manager Permission granted to share information with : Yes, Verbal Permission Granted  Share Information with NAME: Case Manager     Permission granted to share info w Relationship: Eddie Candle grand dtr [phone]     Emotional Assessment Appearance:: Appears stated age Attitude/Demeanor/Rapport: Gracious Affect (typically observed): Accepting Orientation: : Oriented to Self Alcohol / Substance Use: Not Applicable  Psych  Involvement: No (comment)  Admission diagnosis:  Shortness of breath [R06.02] Hypoxia [R09.02] COPD exacerbation (HCC) [J44.1] Acute respiratory failure with hypoxia (HCC) [J96.01] Acute respiratory failure with hypercapnia ([place]) [J96.02] Patient Active Problem List   Diagnosis Date Noted  . History of noncompliance with medical treatment   . Acute respiratory failure with hypoxia ([place]) 10/06/2019  . COPD exacerbation ([place]) 10/06/2019  . Generalized weakness 07/12/2017  . Palliative care encounter 07/12/2017  . 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 Pressure injury of skin 05/30/2017  . Malnutrition of moderate degree 05/30/2017  . Hip fx, right, closed, initial encounter ([place]) 05/29/2017  . Closed displaced intertrochanteric fracture of right femur ([place]) 05/29/2017  . Dysphagia 05/29/2017  . Weight loss 05/29/2017  . Difficult airway for intubation 05/29/2017  . Lesion of epiglottis 05/26/2014  . Abnormal CT scan, pancreas or bile duct   . Right leg DVT ([place]) 05/08/2014  . Anemia 01/09/2014  . Hip pain, right 05/06/2011  . 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 CKD (chronic kidney disease) stage 3, GFR 30-59 ml/min (HCC) 03/04/2011  . Diastolic dysfunction 03/88/8280  . Tobacco abuse 03/04/2011  . Constipation 03/04/2011  . Malignant hypertensive urgency 03/01/2011  . Cerebral infarction ([place]) 03/01/2011  . NEUROPATHY, IDIOPATHIC 10/25/2007  . Hypercholesteremia 12/15/2006  . Essential hypertension 12/15/2006   PCP:  Aretta Nip, MD Pharmacy:   [facility] [place] - [address] [place] [address] Phone: [phone] Fax: [phone]     Social Determinants of Health (SDOH) Interventions    Readmission Risk Interventions No flowsheet data found.

## 2019-10-12 NOTE — Progress Notes (Signed)
  Speech Language Pathology Treatment: Cognitive-Linquistic  Patient Details Name: Katelyn [name] MRN: [id number] DOB: 08/12/1950 Today's Date: 10/12/2019 Time: 5449-2010 SLP Time Calculation (min) (ACUTE ONLY): 13 min  Assessment / Plan / Recommendation Clinical Impression  Patient seen for treatment with focus on aug communication in light of total laryngectomy. Patient able to utilize communication boards in room with 100% accuracy to make basic needs known. SLP provided print out of options for aug comm apps to use of patient's I-pad to facilitate improved communication however patient declined SLPs assistance in installing apps in order to assist with use. Requested that list be given to daughter in room. Educated daughter on potential text to talk applications which may assist her mother in communication. In general, patient appears neutral on desire to change communication approach. Primary evaluating SLP not available today but may have additional information for patient and family. Will continue to f/u briefly.    HPI HPI: 69 yo female adm to [facility] with respiratory failure.  Pt PMH + for epiglottic lesion noted in 2016, laryngeal cancer diagnosis in 2019 s/p laryngectomy and radical neck dissection completed at [facility] 07/2017.  She has h/o right leg DVT, COPD, Essential HTN, CVA 2013, fall with hip fx in 05/2017, ataxia, neuropathy and dysphagia.  Swallow evaluation ordered as MD concerned for pt having aspiration risk per notes. Pt used to use a Lary-tube but no longer uses it.    Swallow eval ordered.      SLP Plan  Continue with current plan of care       Recommendations                   Oral Care Recommendations: Oral care BID Follow up Recommendations: None SLP Visit Diagnosis: [place] (R49.1) Plan: Continue with current plan of care       GO             Katelyn Mcclish MA, [place] 10/12/2019, 12:23 PM

## 2019-10-12 NOTE — Progress Notes (Signed)
Daily Progress Note   Patient Name: Katelyn [name]       Date: 10/12/2019 DOB: [birth year]  Age: 69 y.o. MRN#: [id number] Attending Physician: Shelly Coss, MD Primary Care Physician: Aretta Nip, MD Admit Date: 10/06/2019  Reason for Consultation/Follow-up: Establishing goals of care  Subjective: Per daughter at bedside, patient just had episode of agitation and seemed to indicate her abdomen hurt  Length of Stay: 6  Current Medications: Scheduled Meds:  . amoxicillin-clavulanate  1 tablet Oral Q12H  . apixaban  5 mg Oral BID  . atorvastatin  80 mg Oral Daily  . guaiFENesin-dextromethorphan  10 mL Oral Q6H  . ipratropium-albuterol  3 mL Nebulization BID  . mouth rinse  15 mL Mouth Rinse BID  . metoprolol tartrate  25 mg Oral BID  . nystatin cream   Topical TID  . polyethylene glycol  17 g Oral Daily  . predniSONE  40 mg Oral Q breakfast  . sodium chloride HYPERTONIC  4 mL Nebulization BID    Continuous Infusions:   PRN Meds: acetaminophen (TYLENOL) oral liquid 160 mg/5 mL, albuterol, ALPRAZolam, labetalol  Physical Exam Constitutional:      General: She is not in acute distress.    Comments: Eyes open, does not interact with me  Pulmonary:     Effort: Pulmonary effort is normal.  Abdominal:     Comments: tight  Skin:    General: Skin is warm and dry.  Neurological:     Comments: UTA             Vital Signs: BP 135/87 (BP Location: Right Wrist)   Pulse 90   Temp 98.3 F (36.8 C) (Oral)   Resp (!) 24   Ht _0  (1.499 m)   Wt 61.3 kg   SpO2 96%   BMI 27.30 kg/m  SpO2: SpO2: 96 % O2 Device: O2 Device: Tracheostomy Collar O2 Flow Rate: O2 Flow Rate (L/min): 10 L/min  Intake/output summary:   Intake/Output Summary (Last 24 hours) at 10/12/2019 1632 Last  data filed at 10/11/2019 1830 Gross per 24 hour  Intake 240 ml  Output 400 ml  Net -160 ml   LBM: Last BM Date: 10/11/19 Baseline Weight: Weight: 65.8 kg Most recent weight: Weight: 61.3 kg       Palliative Assessment/Data:PPS 40%      Patient Active Problem List   Diagnosis Date Noted  . History of noncompliance with medical treatment   . Acute respiratory failure with hypoxia ([place]) 10/06/2019  . COPD exacerbation ([place]) 10/06/2019  . Generalized weakness 07/12/2017  . Palliative care encounter 07/12/2017  . 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 Pressure injury of skin 05/30/2017  . Malnutrition of moderate degree 05/30/2017  . Hip fx, right, closed, initial encounter ([place]) 05/29/2017  . Closed displaced intertrochanteric fracture of right femur ([place]) 05/29/2017  . Dysphagia 05/29/2017  . Weight loss 05/29/2017  . Difficult airway for intubation 05/29/2017  . Lesion of epiglottis 05/26/2014  . Abnormal CT scan, pancreas or bile duct   . Right leg DVT ([place]) 05/08/2014  . Anemia 01/09/2014  . Hip pain, right 05/06/2011  . 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 CKD (chronic kidney  disease) stage 3, GFR 30-59 ml/min (HCC) 03/04/2011  . Diastolic dysfunction 71/16/5790  . Tobacco abuse 03/04/2011  . Constipation 03/04/2011  . Malignant hypertensive urgency 03/01/2011  . Cerebral infarction ([place]) 03/01/2011  . NEUROPATHY, IDIOPATHIC 10/25/2007  . Hypercholesteremia 12/15/2006  . Essential hypertension 12/15/2006    Palliative Care Assessment & Plan   HPI: 69 y.o. female  with past medical history of laryngeal cancer s/p laryngectomy and neck dissection with stoma (in remission as far as we know - she has not followed up with oncology as recommended), diastolic CHF,  hypertension, hyperlipidemia, nonhemorrhagic stroke, right leg DVT, hip fracture admitted on 10/06/2019 with shortness of breath and dry cough likely due to COPD exacerbation (new diagnosis of  COPD). She is noted to not have any follow up or taken any medications over the past year. Questionable PE. Mucus plugging noted while hospitalized. Patient is expresses that she is tired and wants to go home.   Assessment: Met at bedside with Katelyn [name] and her daughter. Katelyn [name] appears alert but does not interact with me. Daughter tells me about episode of agitation with abdominal pain. Abdomen feels tight. Daughter thinks she may be constipated. Daughter indicates to call her daughter, Katelyn [name], who is also patient's HCPOA.  I spoke with Katelyn [name]. We reviewed her conversation with PMT [name] yesterday. COPD exacerbations and noncompliance with previous recommendations were discussed. Katelyn [name] tells me she was able to speak with patient last night and patient indicated she was interested if full scope/full code interventions. Katelyn [name] plans to set up patient's follow up appointments.   We did discuss outpatient palliative to follow at home. Katelyn [name] is agreeable to this.   Recommendations/Plan:  Full code/full scope per [name]  Outpatient palliative for ongoing [place] discussions  Add miralax  Prognosis:   Unable to determine  Discharge Planning:  Home with Palliative Services  Care plan was discussed with HCPOA  Thank you for allowing the Palliative Medicine Team to assist in the care of this patient.   Total Time 25 minutes Prolonged Time Billed  no       Greater than 50%  of this time was spent counseling and coordinating care related to the above assessment and plan.  [name], DNP, [facility] Palliative Medicine Team Team Phone # [phone]  Pager [phone]

## 2019-10-13 DIAGNOSIS — Z7189 Other specified counseling: Secondary | ICD-10-CM

## 2019-10-13 DIAGNOSIS — Z515 Encounter for palliative care: Secondary | ICD-10-CM

## 2019-10-13 MED ORDER — ONDANSETRON HCL 4 MG/2ML IJ SOLN
4.0000 mg | Freq: Three times a day (TID) | INTRAMUSCULAR | Status: DC | PRN
  Administered 2019-10-13 – 2019-10-15 (×2): 4 mg via INTRAVENOUS
  Filled 2019-10-13 (×3): qty 2

## 2019-10-13 NOTE — Progress Notes (Signed)
PROGRESS NOTE  Katelyn [name] FXT:[id number] DOB: 1950-09-28 DOA: 10/06/2019 PCP: Aretta Nip, MD  Brief History   Katelyn Hamiltonis a 69 y.o.femalewith medical history significant oflaryngeal cancer status post total laryngectomy status post neck dissectionandcurrently on a stoma, hypertension, hyperlipidemia, nonhemorrhagic CVA, right leg DVT, hip fracture, diastolic CHF who presents to the emergency department with complaint of shortness of breath and cough .  She was admitted for the management of possible COPD exacerbation.  Hospital course remarkable for persistent requirement of high amount of oxygen for maintenance of saturation.PCCM also consulted and following.  Palliative care consulted  for goals of care discussion due to her noncompliance, persistent oxygen requirement, high chance of decompensation in the near future.  Palliative care met with the patient and her daughter, Katelyn [name] on 10/12/2019. They determined that for the time being the patient would continue to be Full Code with full scope of treatment. They would continue palliative discussions for goals of care as outpatient. The plan would be for the patient to go home with palliative services.  Consultants  . PCCM . Palliative Care  Procedures  . None  Antibiotics   Anti-infectives (From admission, onward)   Start     Dose/Rate Route Frequency Ordered Stop   10/10/19 1245  amoxicillin-clavulanate (AUGMENTIN) 875-125 MG per tablet 1 tablet        1 tablet Oral Every 12 hours 10/10/19 1149     10/06/19 1930  azithromycin (ZITHROMAX) tablet 500 mg        500 mg Oral Daily 10/06/19 1807 10/08/19 1105    .  Subjective  The patient is resting comfortably. Secretions from her trach are thick and she has some oozing from her stoma site due to irritation from frequent suctioning.  Objective   Vitals:  Vitals:   10/13/19 1308 10/13/19 1450  BP: 112/81   Pulse: 77 79  Resp: 16 17  Temp: (!) 97.5 F  (36.4 C)   SpO2: (!) 83% 96%   Exam:  Constitutional:  . The patient is awake, alert, and oriented x 3. No acute distress. Neck:  . TRach site functioning. Mild erythema and bleeding from stoma site. . no thyromegaly Respiratory:  . No increased work of breathing. . No wheezes, rales, or rhonchi . No tactile fremitus Cardiovascular:  . Regular rate and rhythm . No murmurs, ectopy, or gallups. . No lateral PMI. No thrills. Abdomen:  . Abdomen is soft, non-tender, non-distended . No hernias, masses, or organomegaly . Normoactive bowel sounds.  Musculoskeletal:  . No cyanosis, clubbing, or edema Skin:  . No rashes, lesions, ulcers . palpation of skin: no induration or nodules Neurologic:  . CN 2-12 intact . Sensation all 4 extremities intact Psychiatric:  . Mental status o Mood, affect appropriate o Orientation to person, place, time  . judgment and insight appear intact  I have personally reviewed the following:   Today's Data  . Vitals, BMP  Imaging  . CTA chest, VQ scan: Pulmonary embolus  Scheduled Meds: . amoxicillin-clavulanate  1 tablet Oral Q12H  . apixaban  5 mg Oral BID  . atorvastatin  80 mg Oral Daily  . guaiFENesin-dextromethorphan  10 mL Oral Q6H  . ipratropium-albuterol  3 mL Nebulization BID  . mouth rinse  15 mL Mouth Rinse BID  . metoprolol tartrate  25 mg Oral BID  . nystatin cream   Topical TID  . polyethylene glycol  17 g Oral Daily  . predniSONE  40 mg Oral Q breakfast  .  sodium chloride HYPERTONIC  4 mL Nebulization BID   Continuous Infusions:  Principal Problem:   Acute respiratory failure with hypoxia (HCC) Active Problems:   Hypercholesteremia   Essential hypertension   Cerebral infarction (HCC)   Diastolic dysfunction   Right leg DVT (HCC)   Generalized weakness   COPD exacerbation (HCC)   History of noncompliance with medical treatment   Goals of care, counseling/discussion   Palliative care by specialist   LOS: 7 days    A & P   Acute respiratory failure with hypoxia and hypercarbia: Presented with severe shortness of breath, dry cough. Most likely from COPD exacerbation/ frequent mucus plugging of the stoma/chronic thromboembolic disease.  She was on 5 L of oxygen initially per minute but oxygen requirement increased to 10 L/min..  We will continue to try to wean the oxygen.  She needs humidification at home to prevent recurrent plugging.Home DME ordered.n She cannot be discharged unless her oxygen requirement improves to at least 5-6 L/min. The patient is also having thick secretions. These are being addressed with guaifenesin. She is also having a little bleeding around stoma site. This is probably related to the Eliquis she is on for PE. Monitor.  Pneumonia: Chest x-ray done on 10/10/19 showed possibility of bilateral basal pneumonia which cud be from aspiration.  Started on Augmentin.  She is afebrile .  We have ordered sputum cultures.  Speech therapy also consulted.  COPD exacerbation: No documented history of COPD as per the chart. Presented with dyspnea, cough.ABG showed hypercarbia. Started on Solu-Medrol,now changed to prednisone. Completed course of  azithromycin.She needs to follow-up with pulmonology as an outpatient for PFTs. Continue bronchodilators. Continue supplemental oxygen as needed.  Laryngeal cancer: Status post total laryngectomy, neck dissection. Currently in remission. She used to follow-up with Dr. Marcelene Butte, at [facility]. Currently has a stoma only at her tracheostomy site. We recommend to follow-up with ENT as an outpatient. She is extremely noncompliant and has not taken any medications and has not seen a physician for a year.  Elevated D-dimer/History of right lower extremity DVT/Likely current pulmonary embolus:Currently not on any medications. Daughter unsure about the previous medication she was taking.Venous Doppler of the bilateral lower extremities  showed chronic  right lower extremity DVT. CT angiogram did not show any pulmonary embolism.  Since echocardiogram showed mildly elevated pulmonary artery pressure, VQ scan was done  which showed right mid lung perfusion defect laterally, for which pulmonary embolism cannot be excluded.  Started on Eliquis.  Chronic diastolic congestive heart failure:Echo done on this admission showed ejection fraction of 66 5%, grade 1 diastolic dysfunction.Does not take any diuretics at home. Chest x-ray done on 10/07/2019 showed pulmonary vascular congestion, bibasilar airspace disease consistent with edema or infection.    Given a dose of Lasix IV 40 mg once.  BNP is normal so diuretics not continued.  She is euvolemic.  AKI: Most likely secondary to Lasix that was given once.  We will hold Lasix and continue to monitor BMP.AKI has imroved  History of nonhemorrhagic stroke:Has mild right hemiparesis. Does not take aspirin or Plavix at home. Daughter states she was previously on aspirin which was stopped by her previous providers.  Started on Eliquis here  Hyperlipidemia:Previously taking cholesterol medications but not taking anything at present.  LDL more than 200.  Started on Lipitor 80 mg daily.  Hypertension:Noted to be severely hypertensive on presentation. Not taking any medication at home. Continue as needed medications. Started on amlodipine but  now discontinued due to bradycardia and soft blood pressure.  Rash: Most likely fungal rash beneath her breasts.  Continue nystatin  Anxiety/panic attack: Continue Xanax as needed.  Generalized weakness:Has gait problem. Ambulates minimally with the help of walker.   PT/OT recommend home health.  I have seen and examined this patient myself. I have spent 35 minutes in her evaluation and care.  DVT prophylaxis:Lovenox Code Status: Full Family Communication: Non available Disposition: Status is: Inpatient  Remains inpatient  appropriate because:Hemodynamically unstable, high oxygen requirements. Requires frequent suctioning.  Dispo: The patient is from: Home  Anticipated d/c is to: Home with palliative care.  Anticipated d/c date is:2-3 days  Patient currently is not medically stable to d/c.  Con Arganbright, DO [facility] Direct contact: see [email or web address]  7PM-7AM contact night coverage as above 10/13/2019, 3:56 PM  LOS: 7 days

## 2019-10-13 NOTE — Care Management Important Message (Signed)
Important Message  Patient Details IM Letter given to the Patient Name: Katelyn [name] MRN: [id number] Date of Birth: Jul 07, 1950   Medicare Important Message Given:  Yes     [name] 10/13/2019, 10:51 AM

## 2019-10-13 NOTE — Progress Notes (Signed)
Manufacturing engineer Documentation  Liaison received referral for pt to participate in [facility] outpatient palliative program. Writer called pt's room to confirm interest with no answer.   Liaison will continue to follow pt through hospital stay to alert [facility] palliative staff of pt dc so that admission can be arranged.   Please do not hesitate to outreach with any questions and thank you for the referral.   Freddie Breech, RN  [facility] Liaison [phone]

## 2019-10-13 NOTE — Progress Notes (Signed)
NAME:  Katelyn [name], MRN:  [id number], DOB:  Mar 17, 1950, LOS: 7 ADMISSION DATE:  10/06/2019, CONSULTATION DATE: 8/12 REFERRING MD:  Tawanna Solo, CHIEF COMPLAINT:  Hypoxia    Brief History   69 year old female with complicated medical history including prior laryngectomy and radical neck from laryngeal cancer, prior smoker.  Diastolic heart dysfunction.  Admitted with approximate 2-3 day history of cough and shortness of breath.  Found to be hypoxic, and hypercarbic in the emergency room.  Received 1st COVID shot 10 days prior to admit.    Arterial blood gas demonstrated hypercarbic respiratory failure.  A CT chest was obtained this was negative for pulmonary emboli no significant pulmonary edema or atelectasis.  She did have a very small hiatal hernia.  She is admitted with a working diagnosis of COPD exacerbation as a diagnosis of exclusion.  Pulmonary asked to evaluate to better define the etiology of her hypoxia and make further recommendations.  Past Medical History  Prior laryngectomy and radical neck, secondary to laryngeal cancer, hypertension, hyperlipidemia, prior nonhemorrhagic CVA, right leg deep vein thrombosis, hip fracture, diastolic heart failure.  Significant Hospital Events   8/12 Admit  8/16 Episode of mucus plugging, increased O2 needs  8/19 Pt on 8L/35% ATC / improved   Consults:  PCCM 8/13  Palliative Care 8/17   Procedures:    Significant Diagnostic Tests:  CT chest 8/12 >> negative for pulmonary emboli, pulmonary edema, atelectasis, pneumonia or pleural effusions LE Venous Duplex 8/13 >> chronic DVT in the right femoral vein, right popliteal vein, negative left  ECHO 8/13 >> LVEF 60-65%, no RWMA, mild LVH, grade I diastolic dysfunction, RVS function normal VQ Scan 8/15 >> right mid lung perfusion defect laterally, PE can not be excluded   Micro Data:  COVID 8/12 >> negative   Antimicrobials:  Azithromycin 8/12 >>  Interim history/subjective:   Afebrile Pt reports she is anxious to go home O2 needs significantly improved   Objective   Blood pressure 112/80, pulse 73, temperature 97.8 F (36.6 C), temperature source Oral, resp. rate 16, height 4\' 11"  (1.499 m), weight 61.3 kg, SpO2 99 %.    FiO2 (%):  [40 %-60 %] 40 %   Intake/Output Summary (Last 24 hours) at 10/13/2019 1241 Last data filed at 10/13/2019 0344 Gross per 24 hour  Intake 250 ml  Output 500 ml  Net -250 ml   Filed Weights   10/06/19 2140 10/07/19 1402  Weight: 65.8 kg 61.3 kg    Examination: General: elderly female lying in bed in NAD HEENT: MM pink/moist, stoma clean, trach collar in place Neuro: Awake, alert, oriented/ communicates by mouthing workds CV: s1s2 RRR, no m/r/g PULM: non-labored on trach collar, lungs clear GI: soft, bsx4 active  Extremities: warm/dry, no edema  Skin: no rashes or lesions  Resolved Hospital Problem list     Assessment & Plan:   Acute hypoxic and hypercarbic respiratory failure  Purulent bronchitis  Bronchospasm  Mucous plugging  Possible COPD exacerbation  Laryngectomy in setting of prior radical neck for laryngeal cancer Deconditioning Diastolic heart failure currently compensated Chronic RLE DVT    Acute hypoxic and hypercarbic respiratory failure in setting of purulent bronchitis, mucous plugging, +/- COPD exacerbation and questionable PE  / new finding of chronic RLE DVT.   Unable to do PFTs. Was prior smoker.  CTA chest motion degraded / only large vessels ruled out.  VQ with wedge shaped defect on right which raises concern for possible PE.  -eliquis per pharmacy  -  duoneb  -wean O2 for sats 88-94%, may need trach collar to go home if unable to be weaned  -NTS PRN  -taper steroids to off   Goals of Care  -appreciate Palliative care team   Best practice:  Per primary  PCCM will sign off. Please call back if new needs arise.   Labs    CBC: Recent Labs  Lab 10/07/19 0431 10/10/19 0528  10/11/19 0546  WBC 7.0 12.7* 11.1*  NEUTROABS  --  9.5* 9.4*  HGB 12.9 13.2 12.4  HCT 41.7 44.4 41.5  MCV 92.1 95.7 95.6  PLT 168 186 638    Basic Metabolic Panel: Recent Labs  Lab 10/07/19 0431 10/08/19 0520 10/09/19 0523 10/10/19 0528 10/12/19 0544  NA 139 134* 139 139 141  K 4.2 3.9 4.2 4.2 4.0  CL 105 100 103 103 106  CO2 24 23 25 25 25   GLUCOSE 157* 125* 114* 103* 112*  BUN 29* 46* 44* 40* 51*  CREATININE 1.16* 1.46* 1.24* 1.26* 1.32*  CALCIUM 9.7 9.3 9.4 9.1 8.8*   GFR: Estimated Creatinine Clearance: 31.6 mL/min (A) (by C-G formula based on SCr of 1.32 mg/dL (H)). Recent Labs  Lab 10/07/19 0431 10/10/19 0528 10/11/19 0546  WBC 7.0 12.7* 11.1*    Liver Function Tests: No results for input(s): AST, ALT, ALKPHOS, BILITOT, PROT, ALBUMIN in the last 168 hours. No results for input(s): LIPASE, AMYLASE in the last 168 hours. No results for input(s): AMMONIA in the last 168 hours.  ABG    Component Value Date/Time   PHART 7.205 (L) 10/06/2019 1438   PCO2ART 59.8 (H) 10/06/2019 1438   PO2ART 88.6 10/06/2019 1438   HCO3 22.8 10/06/2019 1438   TCO2 25 04/12/2018 1127   ACIDBASEDEF 5.8 (H) 10/06/2019 1438   O2SAT 93.2 10/06/2019 1438     Coagulation Profile: No results for input(s): INR, PROTIME in the last 168 hours.  Cardiac Enzymes: No results for input(s): CKTOTAL, CKMB, CKMBINDEX, TROPONINI in the last 168 hours.  HbA1C: Hgb A1c MFr Bld  Date/Time Value Ref Range Status  10/07/2019 04:13 AM 5.4 4.8 - 5.6 % Final    Comment:    (NOTE) Pre diabetes:          5.7%-6.4%  Diabetes:              >6.4%  Glycemic control for   <7.0% adults with diabetes   03/02/2011 06:02 AM 5.6 <5.7 % Final    Comment:    (NOTE)                                                                       According to the ADA Clinical Practice Recommendations for 2011, when HbA1c is used as a screening test:  >=6.5%   Diagnostic of Diabetes Mellitus           (if  abnormal result is confirmed) 5.7-6.4%   Increased risk of developing Diabetes Mellitus References:Diagnosis and Classification of Diabetes Mellitus,Diabetes VFIE,3329,51(OACZY 1):S62-S69 and Standards of Medical Care in         Diabetes - 2011,Diabetes SAYT,0160,10 (Suppl 1):S11-S61.    CBG: No results for input(s): GLUCAP in the last 168 hours.    Critical care time: n/a  Noe Gens, MSN, NP-C [facility] 10/13/2019, 12:41 PM   Please see [email or web address] for pager details.

## 2019-10-13 NOTE — Plan of Care (Addendum)
  Problem: Health Behavior/Discharge Planning: Goal: Ability to manage health-related needs will improve Outcome: Progressing   Problem: Clinical Measurements: Goal: Ability to maintain clinical measurements within normal limits will improve Outcome: Progressing Goal: Will remain free from infection Outcome: Progressing Goal: Diagnostic test results will improve Outcome: Progressing Goal: Respiratory complications will improve Outcome: Progressing Goal: Cardiovascular complication will be avoided Outcome: Progressing   Problem: Activity: Goal: Risk for activity intolerance will decrease Outcome: Progressing   Problem: Nutrition: Goal: Adequate nutrition will be maintained Outcome: Progressing   Problem: Coping: Goal: Level of anxiety will decrease Outcome: Progressing   Problem: Elimination: Goal: Will not experience complications related to bowel motility Outcome: Progressing   Problem: Safety: Goal: Ability to remain free from injury will improve Outcome: Progressing   Problem: Skin Integrity: Goal: Risk for impaired skin integrity will decrease Outcome: Progressing  0340- C/o of sob, pt agitated and restless. No desaturation noted, tachypnea,informed RT, increased oxygen, suctioned pt, noted thick clotted trache secretions. 0345- Pt seen and attended by RT, assisted accordingly and 3 pieces of clots removed, work of breathing and saturation improved, pt calm, not in distress.

## 2019-10-14 LAB — BASIC METABOLIC PANEL
Anion gap: 11 (ref 5–15)
BUN: 43 mg/dL — ABNORMAL HIGH (ref 8–23)
CO2: 26 mmol/L (ref 22–32)
Calcium: 9.3 mg/dL (ref 8.9–10.3)
Chloride: 105 mmol/L (ref 98–111)
Creatinine, Ser: 1.23 mg/dL — ABNORMAL HIGH (ref 0.44–1.00)
GFR calc Af Amer: 51 mL/min — ABNORMAL LOW (ref >60)
GFR calc non Af Amer: 44 mL/min — ABNORMAL LOW (ref >60)
Glucose, Bld: 92 mg/dL (ref 70–99)
Potassium: 4.6 mmol/L (ref 3.5–5.1)
Sodium: 142 mmol/L (ref 135–145)

## 2019-10-14 NOTE — Progress Notes (Signed)
PROGRESS NOTE  Katelyn [name] FWY:[id number] DOB: 12-27-1950 DOA: 10/06/2019 PCP: Aretta Nip, MD  Brief History   Katelyn Hamiltonis a 69 y.o.femalewith medical history significant oflaryngeal cancer status post total laryngectomy status post neck dissectionandcurrently on a stoma, hypertension, hyperlipidemia, nonhemorrhagic CVA, right leg DVT, hip fracture, diastolic CHF who presents to the emergency department with complaint of shortness of breath and cough .  She was admitted for the management of possible COPD exacerbation.  Hospital course remarkable for persistent requirement of high amount of oxygen for maintenance of saturation.PCCM also consulted and following.  Palliative care consulted  for goals of care discussion due to her noncompliance, persistent oxygen requirement, high chance of decompensation in the near future.  Palliative care met with the patient and her daughter, Katelyn [name] on 10/12/2019. They determined that for the time being the patient would continue to be Full Code with full scope of treatment. They would continue palliative discussions for goals of care as outpatient. The plan would be for the patient to go home with palliative services.  She has been evaluated by ST. They have cleared her for regular consistency diet with thin liquids. They have signed off.  Consultants  . PCCM . Palliative Care  Procedures  . None  Antibiotics   Anti-infectives (From admission, onward)   Start     Dose/Rate Route Frequency Ordered Stop   10/10/19 1245  amoxicillin-clavulanate (AUGMENTIN) 875-125 MG per tablet 1 tablet        1 tablet Oral Every 12 hours 10/10/19 1149     10/06/19 1930  azithromycin (ZITHROMAX) tablet 500 mg        500 mg Oral Daily 10/06/19 1807 10/08/19 1105     Subjective  The patient is resting comfortably. No new complaints.  Objective   Vitals:  Vitals:   10/14/19 1054 10/14/19 1453  BP:    Pulse:    Resp:    Temp:    SpO2:  94% 95%   Exam:  Constitutional:  . The patient is awake, alert, and oriented x 3. No acute distress. Neck:  . [name] site functioning. Mild erythema and bleeding from stoma site appears resolved. . no thyromegaly Respiratory:  . No increased work of breathing. . No wheezes, rales, or rhonchi . No tactile fremitus Cardiovascular:  . Regular rate and rhythm . No murmurs, ectopy, or gallups. . No lateral PMI. No thrills. Abdomen:  . Abdomen is soft, non-tender, non-distended . No hernias, masses, or organomegaly . Normoactive bowel sounds.  Musculoskeletal:  . No cyanosis, clubbing, or edema Skin:  . No rashes, lesions, ulcers . palpation of skin: no induration or nodules Neurologic:  . CN 2-12 intact . Sensation all 4 extremities intact Psychiatric:  . Mental status o Mood, affect appropriate o Orientation to person, place, time  . judgment and insight appear intact  I have personally reviewed the following:   Today's Data  . Vitals, BMP  Imaging  . CTA chest, VQ scan: Pulmonary embolus  Scheduled Meds: . amoxicillin-clavulanate  1 tablet Oral Q12H  . apixaban  5 mg Oral BID  . atorvastatin  80 mg Oral Daily  . guaiFENesin-dextromethorphan  10 mL Oral Q6H  . ipratropium-albuterol  3 mL Nebulization BID  . mouth rinse  15 mL Mouth Rinse BID  . metoprolol tartrate  25 mg Oral BID  . nystatin cream   Topical TID  . polyethylene glycol  17 g Oral Daily  . sodium chloride HYPERTONIC  4 mL Nebulization  BID   Continuous Infusions:  Principal Problem:   Acute respiratory failure with hypoxia (HCC) Active Problems:   Hypercholesteremia   Essential hypertension   Cerebral infarction (HCC)   Diastolic dysfunction   Right leg DVT (HCC)   Generalized weakness   COPD exacerbation (HCC)   History of noncompliance with medical treatment   Goals of care, counseling/discussion   Palliative care by specialist   LOS: 8 days   A & P   Acute respiratory failure with  hypoxia and hypercarbia: Presented with severe shortness of breath, dry cough. Most likely from COPD exacerbation/ frequent mucus plugging of the stoma/chronic thromboembolic disease.  She was on 5 L of oxygen initially per minute but oxygen requirement increased to 10 L/min..  We will continue to try to wean the oxygen.  She needs humidification at home to prevent recurrent plugging. Home DME ordered. The patient is also having thick secretions. These are being addressed with guaifenesin. She is also having a little bleeding around stoma site. This is probably related to the Eliquis she is on for PE. Monitor. Today the patient is saturating at 95% at 5 liters after requiring 8 liters earlier this morning. Continue to monitor for stability on lower FIO2.  Pneumonia: Chest x-ray done on 10/10/19 showed possibility of bilateral basal pneumonia which cud be from aspiration.  Started on Augmentin.  She is afebrile .  We have ordered sputum cultures.  Speech therapy also consulted.  COPD exacerbation: No documented history of COPD as per the chart. Presented with dyspnea, cough.ABG showed hypercarbia. Started on Solu-Medrol,now changed to prednisone. Completed course of  azithromycin.She needs to follow-up with pulmonology as an outpatient for PFTs. Continue bronchodilators. Continue supplemental oxygen as needed.  Laryngeal cancer: Status post total laryngectomy, neck dissection. Currently in remission. She used to follow-up with Dr. Marcelene Butte, at [facility]. Currently has a stoma only at her tracheostomy site. We recommend to follow-up with ENT as an outpatient. She is extremely noncompliant and has not taken any medications and has not seen a physician for a year.  Elevated D-dimer/History of right lower extremity DVT/Likely current pulmonary embolus:Currently not on any medications. Daughter unsure about the previous medication she was taking.Venous Doppler of the bilateral lower  extremities showed chronic  right lower extremity DVT. CT angiogram did not show any pulmonary embolism.  Since echocardiogram showed mildly elevated pulmonary artery pressure, VQ scan was done  which showed right mid lung perfusion defect laterally, for which pulmonary embolism cannot be excluded. Continue Eliquis.  Chronic diastolic congestive heart failure:Echo done on this admission showed ejection fraction of 66 5%, grade 1 diastolic dysfunction.Does not take any diuretics at home. Chest x-ray done on 10/07/2019 showed pulmonary vascular congestion, bibasilar airspace disease consistent with edema or infection. Given a dose of Lasix IV 40 mg once. BNP is normal so diuretics not continued. She is euvolemic.  AKI: Most likely secondary to Lasix that was given once. We will hold Lasix and continue to monitor BMP. AKI has improved  History of nonhemorrhagic stroke:Has mild right hemiparesis. Does not take aspirin or Plavix at home. Daughter states she was previously on aspirin which was stopped by her previous providers.  Started on Eliquis here continue.  Hyperlipidemia:Previously taking cholesterol medications but not taking anything at present. LDL more than 200. Started on Lipitor 80 mg daily.  Hypertension:Noted to be severely hypertensive on presentation. Not taking any medication at home. Continue as needed medications. Started on amlodipine but now discontinued due  to bradycardia and soft blood pressure.  Rash: Most likely fungal rash beneath her breasts. Continue nystatin  Anxiety/panic attack: Continue Xanax as needed.  Generalized weakness:Has gait problem. Ambulates minimally with the help of walker. PT/OT recommend home health.  I have seen and examined this patient myself. I have spent 32 minutes in her evaluation and care.  DVT prophylaxis:Lovenox Code Status: Full Family Communication: Non available Disposition: Status is: Inpatient  Remains  inpatient appropriate because:Hemodynamically unstable, high oxygen requirements. Requires frequent suctioning.  Dispo: The patient is from: Home  Anticipated d/c is to: Home with palliative care.  Anticipated d/c date is:2-3 days  Patient currently is not medically stable to d/c.  Jonatan Wilsey, DO [facility] Direct contact: see [email or web address]  7PM-7AM contact night coverage as above 10/14/2019, 3:32 PM  LOS: 7 days

## 2019-10-14 NOTE — Progress Notes (Addendum)
  Speech Language Pathology Treatment: Cognitive-Linquistic;Dysphagia  Patient Details Name: Katelyn [name] MRN: [id number] DOB: 05-04-1950 Today's Date: 10/14/2019 Time: 1100-1120 SLP Time Calculation (min) (ACUTE ONLY): 20 min  Assessment / Plan / Recommendation Clinical Impression  Pt seen at bedside for follow up regarding effective communication and swallowing. Daughter was present. RN reports no difficulty with PO intake or meds crushed in puree. RN also indicates pt does not consume enough liquids throughout the day. Pt was observed drinking thin liquids via straw. No overt s/s aspiration observed. Pt difficulty appears to be more esophageal in nature. She was encouraged to continue to drink liquids during meals to facilitate esophageal clearing. Pt is able to mouth words effectively, and has access to a white board if she needs to write something out. Neither pt nor daughter express interest in additional augmentative/alternative communication devices.  ST will sign off at this time. Please reconsult if needs arise.   HPI HPI: 69 yo female adm to [facility] with respiratory failure.  Pt PMH + for epiglottic lesion noted in 2016, laryngeal cancer diagnosis in 2019 s/p laryngectomy and radical neck dissection completed at [facility] 07/2017.  She has h/o right leg DVT, COPD, Essential HTN, CVA 2013, fall with hip fx in 05/2017, ataxia, neuropathy and dysphagia.  Swallow evaluation ordered as MD concerned for pt having aspiration risk per notes. Pt used to use a Lary-tube but no longer uses it.    Swallow eval ordered.      SLP Plan  All goals met - DC ST       Recommendations  Diet recommendations: Regular;Thin liquid Liquids provided via: Straw;Cup Medication Administration: Crushed with puree Compensations: Slow rate;Small sips/bites;Follow solids with liquid Postural Changes and/or Swallow Maneuvers: Seated upright 90 degrees                Oral Care Recommendations: Oral care  BID Follow up Recommendations: None SLP Visit Diagnosis: Aphonia (R49.1);Cognitive communication deficit (R41.841) Plan: All goals met       GO               Watt Geiler B. Quentin Ore, [facility], [place] Speech Language Pathologist Office: [phone] Pager: [phone]  Shonna Chock 10/14/2019, 11:22 AM

## 2019-10-14 NOTE — Plan of Care (Signed)
  Problem: Clinical Measurements: Goal: Respiratory complications will improve Outcome: Progressing   

## 2019-10-15 LAB — CBC
HCT: 39.2 % (ref 36.0–46.0)
Hemoglobin: 11.7 g/dL — ABNORMAL LOW (ref 12.0–15.0)
MCH: 28.7 pg (ref 26.0–34.0)
MCHC: 29.8 g/dL — ABNORMAL LOW (ref 30.0–36.0)
MCV: 96.1 fL (ref 80.0–100.0)
Platelets: 272 10*3/uL (ref 150–400)
RBC: 4.08 MIL/uL (ref 3.87–5.11)
RDW: 12.1 % (ref 11.5–15.5)
WBC: 9.5 10*3/uL (ref 4.0–10.5)
nRBC: 0 % (ref 0.0–0.2)

## 2019-10-15 LAB — BASIC METABOLIC PANEL
Anion gap: 11 (ref 5–15)
BUN: 38 mg/dL — ABNORMAL HIGH (ref 8–23)
CO2: 25 mmol/L (ref 22–32)
Calcium: 8.8 mg/dL — ABNORMAL LOW (ref 8.9–10.3)
Chloride: 105 mmol/L (ref 98–111)
Creatinine, Ser: 1.13 mg/dL — ABNORMAL HIGH (ref 0.44–1.00)
GFR calc Af Amer: 57 mL/min — ABNORMAL LOW (ref >60)
GFR calc non Af Amer: 49 mL/min — ABNORMAL LOW (ref >60)
Glucose, Bld: 84 mg/dL (ref 70–99)
Potassium: 4.2 mmol/L (ref 3.5–5.1)
Sodium: 141 mmol/L (ref 135–145)

## 2019-10-15 NOTE — Progress Notes (Signed)
PROGRESS NOTE  Katelyn [name] TGY:[id number] DOB: 01-27-1951 DOA: 10/06/2019 PCP: Aretta Nip, MD  Brief History   Katelyn Hamiltonis a 69 y.o.femalewith medical history significant oflaryngeal cancer status post total laryngectomy status post neck dissectionandcurrently on a stoma, hypertension, hyperlipidemia, nonhemorrhagic CVA, right leg DVT, hip fracture, diastolic CHF who presents to the emergency department with complaint of shortness of breath and cough .  She was admitted for the management of possible COPD exacerbation.  Hospital course remarkable for persistent requirement of high amount of oxygen for maintenance of saturation.PCCM also consulted and following.  Palliative care consulted  for goals of care discussion due to her noncompliance, persistent oxygen requirement, high chance of decompensation in the near future.  Palliative care met with the patient and her daughter, Katelyn [name] on 10/12/2019. They determined that for the time being the patient would continue to be Full Code with full scope of treatment. They would continue palliative discussions for goals of care as outpatient. The plan would be for the patient to go home with palliative services.  She has been evaluated by ST. They have cleared her for regular consistency diet with thin liquids. They have signed off.  RT has contacted me to let me know that they are concerned regarding the patient's stoma. Will discuss with PCCM.  Consultants  . PCCM . Palliative Care  Procedures  . None  Antibiotics   Anti-infectives (From admission, onward)   Start     Dose/Rate Route Frequency Ordered Stop   10/10/19 1245  amoxicillin-clavulanate (AUGMENTIN) 875-125 MG per tablet 1 tablet        1 tablet Oral Every 12 hours 10/10/19 1149     10/06/19 1930  azithromycin (ZITHROMAX) tablet 500 mg        500 mg Oral Daily 10/06/19 1807 10/08/19 1105     Subjective  The patient is resting comfortably. No new  complaints.  Objective   Vitals:  Vitals:   10/15/19 1256 10/15/19 1420  BP:  114/75  Pulse:  63  Resp:  19  Temp:  (!) 97.5 F (36.4 C)  SpO2: 97% 98%   Exam:  Constitutional:  . The patient is awake, alert, and oriented x 3. No acute distress. Neck:  . [name] site functioning. Mild erythema and bleeding from stoma site appears resolved. . no thyromegaly Respiratory:  . No increased work of breathing. . No wheezes, rales, or rhonchi . No tactile fremitus Cardiovascular:  . Regular rate and rhythm . No murmurs, ectopy, or gallups. . No lateral PMI. No thrills. Abdomen:  . Abdomen is soft, non-tender, non-distended . No hernias, masses, or organomegaly . Normoactive bowel sounds.  Musculoskeletal:  . No cyanosis, clubbing, or edema Skin:  . No rashes, lesions, ulcers . palpation of skin: no induration or nodules Neurologic:  . CN 2-12 intact . Sensation all 4 extremities intact Psychiatric:  . Mental status o Mood, affect appropriate o Orientation to person, place, time  . judgment and insight appear intact  I have personally reviewed the following:   Today's Data  . Vitals, BMP  Imaging  . CTA chest, VQ scan: Pulmonary embolus  Scheduled Meds: . amoxicillin-clavulanate  1 tablet Oral Q12H  . apixaban  5 mg Oral BID  . atorvastatin  80 mg Oral Daily  . guaiFENesin-dextromethorphan  10 mL Oral Q6H  . ipratropium-albuterol  3 mL Nebulization BID  . mouth rinse  15 mL Mouth Rinse BID  . metoprolol tartrate  25 mg Oral BID  .  nystatin cream   Topical TID  . polyethylene glycol  17 g Oral Daily  . sodium chloride HYPERTONIC  4 mL Nebulization BID   Continuous Infusions:  Principal Problem:   Acute respiratory failure with hypoxia (HCC) Active Problems:   Hypercholesteremia   Essential hypertension   Cerebral infarction (HCC)   Diastolic dysfunction   Right leg DVT (HCC)   Generalized weakness   COPD exacerbation (HCC)   History of noncompliance  with medical treatment   Goals of care, counseling/discussion   Palliative care by specialist   LOS: 9 days   A & P   Acute respiratory failure with hypoxia and hypercarbia: Presented with severe shortness of breath, dry cough. Most likely from COPD exacerbation/ frequent mucus plugging of the stoma/chronic thromboembolic disease.  She was on 5 L of oxygen initially per minute but oxygen requirement increased to 10 L/min..  We will continue to try to wean the oxygen.  She needs humidification at home to prevent recurrent plugging. Home DME ordered. The patient is also having thick secretions. These are being addressed with guaifenesin. She is also having a little bleeding around stoma site. This is probably related to the Eliquis she is on for PE. Monitor. Today the patient is saturating at 95% at 5 liters after requiring 8 liters earlier this morning. Continue to monitor for stability on lower FIO2. Although the patient's oxygen requirements have come down to 5-6L, about 2-3 times a day the patient will suddenly desaturate and require suctioning. Plan to discuss with PCCM regarding possible bronchoscopy to evaluate.  Pneumonia: Chest x-ray done on 10/10/19 showed possibility of bilateral basal pneumonia which cud be from aspiration.  Started on Augmentin.  She is afebrile .  We have ordered sputum cultures.  Speech therapy also consulted.  COPD exacerbation: No documented history of COPD as per the chart. Presented with dyspnea, cough.ABG showed hypercarbia. Started on Solu-Medrol,now changed to prednisone. Completed course of  azithromycin.She needs to follow-up with pulmonology as an outpatient for PFTs. Continue bronchodilators. Continue supplemental oxygen as needed.  Laryngeal cancer: Status post total laryngectomy, neck dissection. Currently in remission. She used to follow-up with Dr. Marcelene Butte, at [facility]. Currently has a stoma only at her tracheostomy site. We  recommend to follow-up with ENT as an outpatient. She is extremely noncompliant and has not taken any medications and has not seen a physician for a year.  Elevated D-dimer/History of right lower extremity DVT/Likely current pulmonary embolus:Currently not on any medications. Daughter unsure about the previous medication she was taking.Venous Doppler of the bilateral lower extremities showed chronic  right lower extremity DVT. CT angiogram did not show any pulmonary embolism.  Since echocardiogram showed mildly elevated pulmonary artery pressure, VQ scan was done  which showed right mid lung perfusion defect laterally, for which pulmonary embolism cannot be excluded. Continue Eliquis.  Chronic diastolic congestive heart failure:Echo done on this admission showed ejection fraction of 66 5%, grade 1 diastolic dysfunction.Does not take any diuretics at home. Chest x-ray done on 10/07/2019 showed pulmonary vascular congestion, bibasilar airspace disease consistent with edema or infection. Given a dose of Lasix IV 40 mg once. BNP is normal so diuretics not continued. She is euvolemic.  AKI: Most likely secondary to Lasix that was given once. We will hold Lasix and continue to monitor BMP. AKI has improved  History of nonhemorrhagic stroke:Has mild right hemiparesis. Does not take aspirin or Plavix at home. Daughter states she was previously on aspirin which was  stopped by her previous providers.  Started on Eliquis here continue.  Hyperlipidemia:Previously taking cholesterol medications but not taking anything at present. LDL more than 200. Started on Lipitor 80 mg daily.  Hypertension:Noted to be severely hypertensive on presentation. Not taking any medication at home. Continue as needed medications. Started on amlodipine but now discontinued due to bradycardia and soft blood pressure.  Rash: Most likely fungal rash beneath her breasts. Continue nystatin  Anxiety/panic attack:  Continue Xanax as needed.  Generalized weakness:Has gait problem. Ambulates minimally with the help of walker. PT/OT recommend home health.  I have seen and examined this patient myself. I have spent 38 minutes in her evaluation and care.  DVT prophylaxis:Lovenox Code Status: Full Family Communication: Non available Disposition: Status is: Inpatient  Remains inpatient appropriate because:Hemodynamically unstable, high oxygen requirements. Requires frequent suctioning.  Dispo: The patient is from: Home  Anticipated d/c is to: Home with palliative care.  Anticipated d/c date is:2-3 days  Patient currently is not medically stable to d/c.  Ferguson Gertner, DO [facility] Direct contact: see [email or web address]  7PM-7AM contact night coverage as above 10/15/2019, 3:25 PM  LOS: 7 days

## 2019-10-15 NOTE — Progress Notes (Signed)
PT Cancellation Note  Patient Details Name: Katelyn [name] MRN: [id number] DOB: 03-05-1950   Cancelled Treatment:    Reason Eval/Treat Not Completed: Other (comment) (RN in room, waiting for RT for suctioning and requests PT come back later). RN and nurse tech in room assisting pt with suctioning, waiting for RT. RN asks therapist to return later; will check back as schedule permits.    Talbot Grumbling PT, DPT 10/15/19, 10:52 AM

## 2019-10-15 NOTE — Progress Notes (Signed)
Called to PT room due to desaturation and increased work of breathing. When RT entered room Sp02 81% on 28% ATC. RT increased supplemental 02 to 98% (10 LPM)- saturation recovered over approximately 4 minutes to 100% (RT has titrated 02 down to 40% (10 LPM) during visit with Sp02 of 97% (RN aware and has been encouraged to titrate as appropriate). During visit PT has increased coughing with no production. RT suctioned stoma times 2 with small amount of blood returned (with small blood plugs).  RN states PT has a history of plugging and has been on neb treatments Hypertonic Saline since 10/10/19 and Duo neb. RT gave PT PRN neb at time of visit. RT discussed with RN that if PT needs to be "bagged" to do so at stoma site. RN confirmed that CODE cart contains 6.5 ETT. RT will reach out to MD for any further suggestions. RN encouraged to contact RT if needed.

## 2019-10-16 NOTE — Progress Notes (Signed)
PROGRESS NOTE  Katelyn [name] AUQ:[id number] DOB: 12/13/1950 DOA: 10/06/2019 PCP: Aretta Nip, MD  Brief History   Katelyn Hamiltonis a 69 y.o.femalewith medical history significant oflaryngeal cancer status post total laryngectomy status post neck dissectionandcurrently on a stoma, hypertension, hyperlipidemia, nonhemorrhagic CVA, right leg DVT, hip fracture, diastolic CHF who presents to the emergency department with complaint of shortness of breath and cough .  She was admitted for the management of possible COPD exacerbation.  Hospital course remarkable for persistent requirement of high amount of oxygen for maintenance of saturation.PCCM also consulted and following.  Palliative care consulted  for goals of care discussion due to her noncompliance, persistent oxygen requirement, high chance of decompensation in the near future.  Palliative care met with the patient and her daughter, [name] on 10/12/2019. They determined that for the time being the patient would continue to be Full Code with full scope of treatment. They would continue palliative discussions for goals of care as outpatient. The plan would be for the patient to go home with palliative services.  She has been evaluated by ST. They have cleared her for regular consistency diet with thin liquids. They have signed off.  RT has contacted me to let me know that they are concerned regarding the patient's stoma. Will discuss with PCCM.  Consultants   PCCM  Palliative Care  Procedures   None  Antibiotics   Anti-infectives (From admission, onward)   Start     Dose/Rate Route Frequency Ordered Stop   10/10/19 1245  amoxicillin-clavulanate (AUGMENTIN) 875-125 MG per tablet 1 tablet        1 tablet Oral Every 12 hours 10/10/19 1149     10/06/19 1930  azithromycin (ZITHROMAX) tablet 500 mg        500 mg Oral Daily 10/06/19 1807 10/08/19 1105     Subjective  The patient is resting comfortably. No new  complaints.  Objective   Vitals:  Vitals:   10/16/19 1059 10/16/19 1225  BP: 93/67 96/77  Pulse: 84 64  Resp:  16  Temp:  97.6 F (36.4 C)  SpO2:  98%   Exam:  Constitutional:   The patient is awake, alert, and oriented x 3. No acute distress. Neck:   Trach site functioning. Mild erythema and bleeding from stoma site appears resolved.  no thyromegaly Respiratory:   No increased work of breathing.  No wheezes, rales, or rhonchi  No tactile fremitus Cardiovascular:   Regular rate and rhythm  No murmurs, ectopy, or gallups.  No lateral PMI. No thrills. Abdomen:   Abdomen is soft, non-tender, non-distended  No hernias, masses, or organomegaly  Normoactive bowel sounds.  Musculoskeletal:   No cyanosis, clubbing, or edema Skin:   No rashes, lesions, ulcers  palpation of skin: no induration or nodules Neurologic:   CN 2-12 intact  Sensation all 4 extremities intact Psychiatric:   Mental status o Mood, affect appropriate o Orientation to person, place, time   judgment and insight appear intact  I have personally reviewed the following:   Today's Data   Vitals, BMP, CBC  Imaging   CTA chest, VQ scan: Pulmonary embolus  Scheduled Meds:  amoxicillin-clavulanate  1 tablet Oral Q12H   apixaban  5 mg Oral BID   atorvastatin  80 mg Oral Daily   guaiFENesin-dextromethorphan  10 mL Oral Q6H   ipratropium-albuterol  3 mL Nebulization BID   mouth rinse  15 mL Mouth Rinse BID   metoprolol tartrate  25 mg Oral BID  nystatin cream   Topical TID   polyethylene glycol  17 g Oral Daily   sodium chloride HYPERTONIC  4 mL Nebulization BID   Continuous Infusions:  Principal Problem:   Acute respiratory failure with hypoxia (HCC) Active Problems:   Hypercholesteremia   Essential hypertension   Cerebral infarction (HCC)   Diastolic dysfunction   Right leg DVT (HCC)   Generalized weakness   COPD exacerbation (HCC)   History of  noncompliance with medical treatment   Goals of care, counseling/discussion   Palliative care by specialist   LOS: 10 days   A & P   Acute respiratory failure with hypoxia and hypercarbia: Presented with severe shortness of breath, dry cough. Most likely from COPD exacerbation/ frequent mucus plugging of the stoma/chronic thromboembolic disease.  She was on 5 L of oxygen initially per minute but oxygen requirement increased to 10 L/min..  We will continue to try to wean the oxygen.  She needs humidification at home to prevent recurrent plugging. Home DME ordered. The patient is also having thick secretions. These are being addressed with guaifenesin. She is also having a little bleeding around stoma site. This is probably related to the Eliquis she is on for PE. Monitor. Today the patient is saturating at 95% at 5 liters after requiring 8 liters earlier this morning. Continue to monitor for stability on lower FIO2. Although the patient's oxygen requirements have come down to 5-6L, about 2-3 times a day the patient will suddenly desaturate and require suctioning. Plan to discuss with PCCM regarding possible bronchoscopy to evaluate.  Pneumonia: Chest x-ray done on 10/10/19 showed possibility of bilateral basal pneumonia which cud be from aspiration.  Started on Augmentin.  She is afebrile .  We have ordered sputum cultures.  Speech therapy also consulted.  COPD exacerbation: No documented history of COPD as per the chart. Presented with dyspnea, cough.ABG showed hypercarbia. Started on Solu-Medrol,now changed to prednisone. Completed course of  azithromycin.She needs to follow-up with pulmonology as an outpatient for PFTs. Continue bronchodilators. Continue supplemental oxygen as needed.  Laryngeal cancer: Status post total laryngectomy, neck dissection. Currently in remission. She used to follow-up with Dr. Marcelene Butte, at [facility]. Currently has a stoma only at her  tracheostomy site. We recommend to follow-up with ENT as an outpatient. She is extremely noncompliant and has not taken any medications and has not seen a physician for a year.  Elevated D-dimer/History of right lower extremity DVT/Likely current pulmonary embolus:Currently not on any medications. Daughter unsure about the previous medication she was taking.Venous Doppler of the bilateral lower extremities showed chronic  right lower extremity DVT. CT angiogram did not show any pulmonary embolism.  Since echocardiogram showed mildly elevated pulmonary artery pressure, VQ scan was done  which showed right mid lung perfusion defect laterally, for which pulmonary embolism cannot be excluded. Continue Eliquis.  Chronic diastolic congestive heart failure:Echo done on this admission showed ejection fraction of 66 5%, grade 1 diastolic dysfunction.Does not take any diuretics at home. Chest x-ray done on 10/07/2019 showed pulmonary vascular congestion, bibasilar airspace disease consistent with edema or infection. Given a dose of Lasix IV 40 mg once. BNP is normal so diuretics not continued. She is euvolemic.  AKI: Most likely secondary to Lasix that was given once. We will hold Lasix and continue to monitor BMP. AKI has improved  History of nonhemorrhagic stroke:Has mild right hemiparesis. Does not take aspirin or Plavix at home. Daughter states she was previously on aspirin which was  stopped by her previous providers.  Started on Eliquis here continue.  Hyperlipidemia:Previously taking cholesterol medications but not taking anything at present. LDL more than 200. Started on Lipitor 80 mg daily.  Hypertension:Noted to be severely hypertensive on presentation. Not taking any medication at home. Continue as needed medications. Started on amlodipine but now discontinued due to bradycardia and soft blood pressure.  Rash: Most likely fungal rash beneath her breasts. Continue  nystatin  Anxiety/panic attack: Continue Xanax as needed.  Generalized weakness:Has gait problem. Ambulates minimally with the help of walker. PT/OT recommend home health.  I have seen and examined this patient myself. I have spent 32 minutes in her evaluation and care.  DVT prophylaxis:Lovenox Code Status: Full Family Communication: Non available Disposition: Status is: Inpatient  Remains inpatient appropriate because:Hemodynamically unstable, high oxygen requirements. Requires frequent suctioning.  Dispo: The patient is from: Home  Anticipated d/c is to: Home with palliative care.  Anticipated d/c date is:2-3 days  Patient currently is not medically stable to d/c.  Ava Swayze, DO [facility] Direct contact: see [email or web address]  7PM-7AM contact night coverage as above 10/16/2019, 1:41 PM  LOS: 7 days

## 2019-10-16 NOTE — Progress Notes (Signed)
Received call from RN stating PT desating and complaining of difficulty breathing. RN had increased 02 to 98% and 10LPM with poor Sp02. RT laid PT somewhat flat in bed, lavaged and bagged stoma and suctioned times 3. Suction resulted in copious amount of blood with plugs and some yellow thick mucus. RN aware of PT on 80% Fi02 and agrees to titrate as appropriate.

## 2019-10-17 ENCOUNTER — Inpatient Hospital Stay (HOSPITAL_COMMUNITY): Payer: Medicare Other

## 2019-10-17 LAB — BASIC METABOLIC PANEL
Anion gap: 11 (ref 5–15)
BUN: 30 mg/dL — ABNORMAL HIGH (ref 8–23)
CO2: 26 mmol/L (ref 22–32)
Calcium: 9.1 mg/dL (ref 8.9–10.3)
Chloride: 105 mmol/L (ref 98–111)
Creatinine, Ser: 1.03 mg/dL — ABNORMAL HIGH (ref 0.44–1.00)
GFR calc Af Amer: >60 mL/min (ref >60)
GFR calc non Af Amer: 55 mL/min — ABNORMAL LOW (ref >60)
Glucose, Bld: 80 mg/dL (ref 70–99)
Potassium: 4.5 mmol/L (ref 3.5–5.1)
Sodium: 142 mmol/L (ref 135–145)

## 2019-10-17 LAB — CBC WITH DIFFERENTIAL/PLATELET
Abs Immature Granulocytes: 0.18 10*3/uL — ABNORMAL HIGH (ref 0.00–0.07)
Basophils Absolute: 0 10*3/uL (ref 0.0–0.1)
Basophils Relative: 0 %
Eosinophils Absolute: 0.1 10*3/uL (ref 0.0–0.5)
Eosinophils Relative: 1 %
HCT: 39.7 % (ref 36.0–46.0)
Hemoglobin: 11.9 g/dL — ABNORMAL LOW (ref 12.0–15.0)
Immature Granulocytes: 2 %
Lymphocytes Relative: 23 %
Lymphs Abs: 2.4 10*3/uL (ref 0.7–4.0)
MCH: 29 pg (ref 26.0–34.0)
MCHC: 30 g/dL (ref 30.0–36.0)
MCV: 96.8 fL (ref 80.0–100.0)
Monocytes Absolute: 0.6 10*3/uL (ref 0.1–1.0)
Monocytes Relative: 5 %
Neutro Abs: 7.1 10*3/uL (ref 1.7–7.7)
Neutrophils Relative %: 69 %
Platelets: 257 10*3/uL (ref 150–400)
RBC: 4.1 MIL/uL (ref 3.87–5.11)
RDW: 12.3 % (ref 11.5–15.5)
WBC: 10.3 10*3/uL (ref 4.0–10.5)
nRBC: 0 % (ref 0.0–0.2)

## 2019-10-17 LAB — BLOOD GAS, ARTERIAL
Acid-Base Excess: 1.9 mmol/L (ref 0.0–2.0)
Bicarbonate: 24.6 mmol/L (ref 20.0–28.0)
FIO2: 21
O2 Saturation: 90.6 %
Patient temperature: 98.6
pCO2 arterial: 33.2 mmHg (ref 32.0–48.0)
pH, Arterial: 7.483 — ABNORMAL HIGH (ref 7.350–7.450)
pO2, Arterial: 59.8 mmHg — ABNORMAL LOW (ref 83.0–108.0)

## 2019-10-17 NOTE — Plan of Care (Signed)
  Problem: Clinical Measurements: Goal: Ability to maintain clinical measurements within normal limits will improve Outcome: Progressing Goal: Will remain free from infection Outcome: Progressing Goal: Diagnostic test results will improve Outcome: Progressing Goal: Respiratory complications will improve Outcome: Progressing Goal: Cardiovascular complication will be avoided Outcome: Progressing   Problem: Nutrition: Goal: Adequate nutrition will be maintained Outcome: Progressing   Problem: Coping: Goal: Level of anxiety will decrease Outcome: Progressing   Problem: Elimination: Goal: Will not experience complications related to bowel motility Outcome: Progressing   Problem: Safety: Goal: Ability to remain free from injury will improve Outcome: Progressing   Problem: Skin Integrity: Goal: Risk for impaired skin integrity will decrease Outcome: Progressing

## 2019-10-17 NOTE — Progress Notes (Signed)
PROGRESS NOTE  Katelyn [name] KDT:[id number] DOB: 24-Sep-1950 DOA: 10/06/2019 PCP: Aretta Nip, MD  Brief History   Katelyn Hamiltonis a 69 y.o.femalewith medical history significant oflaryngeal cancer status post total laryngectomy status post neck dissectionandcurrently on a stoma, hypertension, hyperlipidemia, nonhemorrhagic CVA, right leg DVT, hip fracture, diastolic CHF who presents to the emergency department with complaint of shortness of breath and cough .  She was admitted for the management of possible COPD exacerbation.  Hospital course remarkable for persistent requirement of high amount of oxygen for maintenance of saturation.PCCM also consulted and following.  Palliative care consulted  for goals of care discussion due to her noncompliance, persistent oxygen requirement, high chance of decompensation in the near future.  Palliative care met with the patient and her daughter, Katelyn [name] on 10/12/2019. They determined that for the time being the patient would continue to be Full Code with full scope of treatment. They would continue palliative discussions for goals of care as outpatient. The plan would be for the patient to go home with palliative services.  She has been evaluated by ST. They have cleared her for regular consistency diet with thin liquids. They have signed off.  RT has contacted me to let me know that they are concerned regarding the patient's stoma, and the patient required suctioning twice yesterday. I have consulted PCCM.  Consultants  . PCCM . Palliative Care  Procedures  . None  Antibiotics   Anti-infectives (From admission, onward)   Start     Dose/Rate Route Frequency Ordered Stop   10/10/19 1245  amoxicillin-clavulanate (AUGMENTIN) 875-125 MG per tablet 1 tablet        1 tablet Oral Every 12 hours 10/10/19 1149     10/06/19 1930  azithromycin (ZITHROMAX) tablet 500 mg        500 mg Oral Daily 10/06/19 1807 10/08/19 1105     Subjective    The patient is resting comfortably. No new complaints.  Objective   Vitals:  Vitals:   10/17/19 0841 10/17/19 1417  BP:  103/68  Pulse:  79  Resp:  17  Temp:  (!) 97.5 F (36.4 C)  SpO2: 96% 92%   Exam:  Constitutional:  . The patient is awake, alert, and oriented x 3. No acute distress. Neck:  . [name] site functioning. Mild erythema and bleeding from stoma site appears resolved. . no thyromegaly Respiratory:  . No increased work of breathing. . No wheezes, rales, or rhonchi . No tactile fremitus Cardiovascular:  . Regular rate and rhythm . No murmurs, ectopy, or gallups. . No lateral PMI. No thrills. Abdomen:  . Abdomen is soft, non-tender, non-distended . No hernias, masses, or organomegaly . Normoactive bowel sounds.  Musculoskeletal:  . No cyanosis, clubbing, or edema Skin:  . No rashes, lesions, ulcers . palpation of skin: no induration or nodules Neurologic:  . CN 2-12 intact . Sensation all 4 extremities intact Psychiatric:  . Mental status o Mood, affect appropriate o Orientation to person, place, time  . judgment and insight appear intact  I have personally reviewed the following:   Today's Data  . Vitals, BMP, CBC  Imaging  . CTA chest, VQ scan: Pulmonary embolus  Scheduled Meds: . amoxicillin-clavulanate  1 tablet Oral Q12H  . apixaban  5 mg Oral BID  . atorvastatin  80 mg Oral Daily  . guaiFENesin-dextromethorphan  10 mL Oral Q6H  . ipratropium-albuterol  3 mL Nebulization BID  . mouth rinse  15 mL Mouth Rinse BID  .  metoprolol tartrate  25 mg Oral BID  . nystatin cream   Topical TID  . polyethylene glycol  17 g Oral Daily  . sodium chloride HYPERTONIC  4 mL Nebulization BID   Continuous Infusions:  Principal Problem:   Acute respiratory failure with hypoxia (HCC) Active Problems:   Hypercholesteremia   Essential hypertension   Cerebral infarction (HCC)   Diastolic dysfunction   Right leg DVT (HCC)   Generalized weakness    COPD exacerbation (HCC)   History of noncompliance with medical treatment   Goals of care, counseling/discussion   Palliative care by specialist   LOS: 11 days   A & P   Acute respiratory failure with hypoxia and hypercarbia: Presented with severe shortness of breath, dry cough. Most likely from COPD exacerbation/ frequent mucus plugging of the stoma/chronic thromboembolic disease.  She was on 5 L of oxygen initially per minute but oxygen requirement increased to 10 L/min..  We will continue to try to wean the oxygen.  She needs humidification at home to prevent recurrent plugging. Home DME ordered. The patient is also having thick secretions. These are being addressed with guaifenesin. She is also having a little bleeding around stoma site. This is probably related to the Eliquis she is on for PE. Monitor. Today the patient is saturating at 95% at 5 liters after requiring 8 liters earlier this morning. Continue to monitor for stability on lower FIO2. Although the patient's oxygen requirements have come down to 5-6L, about 2-3 times a day the patient will suddenly desaturate and require suctioning. PCCM has been consulted. The patient is saturating 100% on room air today. PCCM feels that the plugging is being caused by frequent suctioning and that this may be managed by guaifenesin and hydration.  Pneumonia: Chest x-ray done on 10/10/19 showed possibility of bilateral basal pneumonia which cud be from aspiration.  Started on Augmentin.  She is afebrile .  We have ordered sputum cultures.  Speech therapy also consulted.  COPD exacerbation: No documented history of COPD as per the chart. Presented with dyspnea, cough.ABG showed hypercarbia. Started on Solu-Medrol,now changed to prednisone. Completed course of  azithromycin.She needs to follow-up with pulmonology as an outpatient for PFTs. Continue bronchodilators.   Laryngeal cancer: Status post total laryngectomy, neck dissection. Currently  in remission. She used to follow-up with Dr. Marcelene Butte, at [facility]. Currently has a stoma only at her tracheostomy site. We recommend to follow-up with ENT as an outpatient. She is extremely noncompliant and has not taken any medications and has not seen a physician for a year.  Elevated D-dimer/History of right lower extremity DVT/Likely current pulmonary embolus:Currently not on any medications. Daughter unsure about the previous medication she was taking.Venous Doppler of the bilateral lower extremities showed chronic  right lower extremity DVT. CT angiogram did not show any pulmonary embolism.  Since echocardiogram showed mildly elevated pulmonary artery pressure, VQ scan was done  which showed right mid lung perfusion defect laterally, for which pulmonary embolism cannot be excluded. Continue Eliquis.  Chronic diastolic congestive heart failure:Echo done on this admission showed ejection fraction of 66 5%, grade 1 diastolic dysfunction.Does not take any diuretics at home. Chest x-ray done on 10/07/2019 showed pulmonary vascular congestion, bibasilar airspace disease consistent with edema or infection. Given a dose of Lasix IV 40 mg once. BNP is normal so diuretics not continued. She is euvolemic.  AKI: Most likely secondary to Lasix that was given once. We will hold Lasix and continue to monitor  BMP. AKI has improved  History of nonhemorrhagic stroke:Has mild right hemiparesis. Does not take aspirin or Plavix at home. Daughter states she was previously on aspirin which was stopped by her previous providers.  Started on Eliquis here continue.  Hyperlipidemia:Previously taking cholesterol medications but not taking anything at present. LDL more than 200. Started on Lipitor 80 mg daily.  Hypertension:Noted to be severely hypertensive on presentation. Not taking any medication at home. Continue as needed medications. Started on amlodipine but now discontinued due  to bradycardia and soft blood pressure.  Rash: Most likely fungal rash beneath her breasts. Continue nystatin  Anxiety/panic attack: Continue Xanax as needed.  Generalized weakness:Has gait problem. Ambulates minimally with the help of walker. PT/OT recommend home health.  I have seen and examined this patient myself. I have spent 40 minutes in her evaluation and care. More than 50% of this has been spent in coordination of care with PCCM.  DVT prophylaxis:Lovenox Code Status: Full Family Communication: Non available Disposition: Status is: Inpatient  Remains inpatient appropriate because:Hemodynamically unstable, high oxygen requirements. Requires frequent suctioning.  Dispo: The patient is from: Home  Anticipated d/c is to: Home with palliative care.  Anticipated d/c date is: 1 days  Patient currently is not medically stable to d/c.  Darius Fillingim, DO [facility] Direct contact: see [email or web address]  7PM-7AM contact night coverage as above 10/17/2019, 5:22 PM  LOS: 7 days

## 2019-10-17 NOTE — Care Management Important Message (Signed)
Important Message  Patient Details IM Letter given to the Patient Name: Katelyn [name] MRN: [id number] Date of Birth: 1950-03-08   Medicare Important Message Given:  Yes     [name] 10/17/2019, 10:58 AM

## 2019-10-17 NOTE — Progress Notes (Addendum)
NAME:  Katelyn [name], MRN:  [id number], DOB:  06-08-1950, LOS: 25 ADMISSION DATE:  10/06/2019, CONSULTATION DATE: 8/12 REFERRING MD:  Tawanna Solo, CHIEF COMPLAINT:  Hypoxia    Brief History   69 year old female with complicated medical history including prior laryngectomy and radical neck from laryngeal cancer, prior smoker.  Diastolic heart dysfunction.  Admitted with approximate 2-3 day history of cough and shortness of breath.  Found to be hypoxic, and hypercarbic in the emergency room.  Received 1st COVID shot 10 days prior to admit.    Arterial blood gas demonstrated hypercarbic respiratory failure.  A CT chest was obtained this was negative for pulmonary emboli no significant pulmonary edema or atelectasis.  She did have a very small hiatal hernia.  She is admitted with a working diagnosis of COPD exacerbation as a diagnosis of exclusion.  Pulmonary asked to evaluate to better define the etiology of her hypoxia and make further recommendations.  Past Medical History  Prior laryngectomy and radical neck, secondary to laryngeal cancer, hypertension, hyperlipidemia, prior nonhemorrhagic CVA, right leg deep vein thrombosis, hip fracture, diastolic heart failure.  Significant Hospital Events   8/12 Admit  8/16 Episode of mucus plugging, increased O2 needs  8/19 Pt on 8L/35% ATC / improved   Consults:  PCCM 8/13  Palliative Care 8/17   Procedures:    Significant Diagnostic Tests:  CT chest 8/12 >> negative for pulmonary emboli, pulmonary edema, atelectasis, pneumonia or pleural effusions LE Venous Duplex 8/13 >> chronic DVT in the right femoral vein, right popliteal vein, negative left  ECHO 8/13 >> LVEF 60-65%, no RWMA, mild LVH, grade I diastolic dysfunction, RVS function normal VQ Scan 8/15 >> right mid lung perfusion defect laterally, PE can not be excluded   Micro Data:  COVID 8/12 >> negative   Antimicrobials:  Azithromycin 8/12 >>  Interim history/subjective:  Pt now on  room air, looks remarkably well compared to last week  Up in chair  Room air sats 95% RT note on 8/22 shows she required suctioning with lavage / thick secretions with blood clots   Objective   Blood pressure 110/66, pulse 64, temperature 98.6 F (37 C), temperature source Oral, resp. rate 18, height 4\' 11"  (1.499 m), weight 61.3 kg, SpO2 96 %.    FiO2 (%):  [28 %-40 %] 28 %   Intake/Output Summary (Last 24 hours) at 10/17/2019 1125 Last data filed at 10/17/2019 0144 Gross per 24 hour  Intake 240 ml  Output 700 ml  Net -460 ml   Filed Weights   10/06/19 2140 10/07/19 1402  Weight: 65.8 kg 61.3 kg    Examination: General: elderly female sitting up in chair in NAD HEENT: MM pink/moist, trach stoma clean, small area of dried blood on interior of stoma / appears like suction trauma, no acute bleeding, anicteric  Neuro: AAOx4, speech clear, MAE CV: s1s2 RRR, no m/r/g PULM: non-labored on RA, lungs bilaterally clear GI: soft, bsx4 active  Extremities: warm/dry, no edema  Skin: no rashes or lesions  PCXR 8/23 >> images personally reviewed, no acute infiltrate or edema   Resolved Hospital Problem list     Assessment & Plan:   Acute hypoxic and hypercarbic respiratory failure  Purulent bronchitis  Bronchospasm  Mucous plugging  Possible COPD exacerbation  Laryngectomy in setting of prior radical neck for laryngeal cancer Deconditioning Diastolic heart failure currently compensated Chronic RLE DVT    Acute hypoxic and hypercarbic respiratory failure in setting of purulent bronchitis, mucous plugging, +/- COPD  exacerbation and questionable PE  / new finding of chronic RLE DVT.   Unable to do PFTs. Was prior smoker.  CTA chest motion degraded / only large vessels ruled out.  VQ with wedge shaped defect on right which raises concern for possible PE.  -no role for bronchoscopy as patient can cough / clear secretions -would continue to work on pulmonary hygiene -mobilize patient,  upright positioning  -continue eliquis per pharmacy  -duoneb  -O2 off currently, wean O2 for sats 88-94%, may need trach collar at discharge for humidity -NTS PRN but only if patient can not cough and clear secretions  -diet per SLP  -continue guaifenesin  -BID hypertonic saline nebs > caution with prolonged use as can irritate the airways  -augmentin per primary, D8/x.  Consider discontinuing.   -assess CXR now, as above   Goals of Care  -appreciate palliative care input   Best practice:  Per primary  Labs    CBC: Recent Labs  Lab 10/11/19 0546 10/15/19 0602 10/17/19 0547  WBC 11.1* 9.5 10.3  NEUTROABS 9.4*  --  7.1  HGB 12.4 11.7* 11.9*  HCT 41.5 39.2 39.7  MCV 95.6 96.1 96.8  PLT 164 272 176    Basic Metabolic Panel: Recent Labs  Lab 10/12/19 0544 10/14/19 0505 10/15/19 0602 10/17/19 0547  NA 141 142 141 142  K 4.0 4.6 4.2 4.5  CL 106 105 105 105  CO2 25 26 25 26   GLUCOSE 112* 92 84 80  BUN 51* 43* 38* 30*  CREATININE 1.32* 1.23* 1.13* 1.03*  CALCIUM 8.8* 9.3 8.8* 9.1   GFR: Estimated Creatinine Clearance: 40.4 mL/min (A) (by C-G formula based on SCr of 1.03 mg/dL (H)). Recent Labs  Lab 10/11/19 0546 10/15/19 0602 10/17/19 0547  WBC 11.1* 9.5 10.3    Liver Function Tests: No results for input(s): AST, ALT, ALKPHOS, BILITOT, PROT, ALBUMIN in the last 168 hours. No results for input(s): LIPASE, AMYLASE in the last 168 hours. No results for input(s): AMMONIA in the last 168 hours.  ABG    Component Value Date/Time   PHART 7.205 (L) 10/06/2019 1438   PCO2ART 59.8 (H) 10/06/2019 1438   PO2ART 88.6 10/06/2019 1438   HCO3 22.8 10/06/2019 1438   TCO2 25 04/12/2018 1127   ACIDBASEDEF 5.8 (H) 10/06/2019 1438   O2SAT 93.2 10/06/2019 1438     Coagulation Profile: No results for input(s): INR, PROTIME in the last 168 hours.  Cardiac Enzymes: No results for input(s): CKTOTAL, CKMB, CKMBINDEX, TROPONINI in the last 168 hours.  HbA1C: Hgb A1c MFr  Bld  Date/Time Value Ref Range Status  10/07/2019 04:13 AM 5.4 4.8 - 5.6 % Final    Comment:    (NOTE) Pre diabetes:          5.7%-6.4%  Diabetes:              >6.4%  Glycemic control for   <7.0% adults with diabetes   03/02/2011 06:02 AM 5.6 <5.7 % Final    Comment:    (NOTE)                                                                       According to the ADA Clinical Practice Recommendations for  2011, when HbA1c is used as a screening test:  >=6.5%   Diagnostic of Diabetes Mellitus           (if abnormal result is confirmed) 5.7-6.4%   Increased risk of developing Diabetes Mellitus References:Diagnosis and Classification of Diabetes Mellitus,Diabetes PHXT,0569,79(YIAXK 1):S62-S69 and Standards of Medical Care in         Diabetes - 2011,Diabetes PVVZ,4827,07 (Suppl 1):S11-S61.    CBG: No results for input(s): GLUCAP in the last 168 hours.    Critical care time: n/a     Noe Gens, MSN, NP-C [facility] 10/17/2019, 11:25 AM   Please see [email or web address] for pager details.

## 2019-10-17 NOTE — Progress Notes (Signed)
[facility] ([facility]) Community Based Palliative Care       This patient has been referred to our palliative care services in the community.  [facility] will continue to follow for any discharge planning needs and to coordinate continuation of palliative care.   If you have questions or need assistance, please call [phone] or contact the hospital Liaison listed on AMION.     Thank you for the opportunity to participate in this patient's care.     Chrislyn King, BSN, RN [facility] Hospital Liaison   [phone]  

## 2019-10-17 NOTE — Progress Notes (Signed)
Physical Therapy Treatment Patient Details Name: Katelyn [name] MRN: [id number] DOB: [birth year] Today's Date: 10/17/2019    History of Present Illness 69 yo female admitted with acute resp failure. Hx of throat ca, neuropathy, CHF, CVA, CKD, DVT, R hip fx, trach 2019    PT Comments    Pt in bed on 6 lts trach/stoma 28% at 100% Assisted OOB to [facility].  General bed mobility comments: increased time and assist esp to complete scooting to EOB.  General transfer comment: pt required increased assist this session as to complete 1/4 pivot turn to [facility]. 75% on proper hand placement and targeting BSC as pt had tendancy to sit prior. Trial RA while on BSC.  Sats > 92% Assisted with amb.  General Gait Details: pt required + 2 side by side assist and and daughter following with recliner to amb only 4 feet due to weakness/fatigue. Positioned in recliner with multiple pillows for pressure relief and comfort.  Remained on RA and notified RN.   Follow Up Recommendations  Home health PT;Supervision/Assistance - 24 hour (Palliative peer chart treview)     Equipment Recommendations  None recommended by PT    Recommendations for Other Services       Precautions / Restrictions Precautions Precautions: Fall Precaution Comments: Hx trach (no oxygen) Laryngeal CA Restrictions Weight Bearing Restrictions: No    Mobility  Bed Mobility Overal bed mobility: Needs Assistance Bed Mobility: Supine to Sit     Supine to sit: Max assist     General bed mobility comments: increased time and assist esp to complete scooting to EOB  Transfers Overall transfer level: Needs assistance Equipment used: Rolling walker (2 wheeled);None Transfers: Sit to/from [facility] Sit to Stand: Mod assist Stand pivot transfers: Max assist       General transfer comment: pt required increased assist this session as to complete 1/4 pivot turn to [facility]. 75% on proper hand placement and targeting BSC as pt had  tendancy to sit prior.  Ambulation/Gait Ambulation/Gait assistance: Max assist;+2 physical assistance;+2 safety/equipment Gait Distance (Feet): 4 Feet Assistive device: Rolling walker (2 wheeled) (youth) Gait Pattern/deviations: Step-to pattern Gait velocity: decreased   General Gait Details: pt required + 2 side by side assist and and daughter following with recliner to amb only 4 feet due to weakness/fatigue.   Stairs             Wheelchair Mobility    Modified Rankin (Stroke Patients Only)       Balance                                            Cognition Arousal/Alertness: Awake/alert Behavior During Therapy: WFL for tasks assessed/performed Overall Cognitive Status: Within Functional Limits for tasks assessed                                 General Comments: non verbal due to Hx Laryngeal CA uses hand gestures and white board to communicate      Exercises      General Comments        Pertinent Vitals/Pain Pain Assessment: No/denies pain    Home Living                      Prior Function  PT Goals (current goals can now be found in the care plan section) Progress towards PT goals: Progressing toward goals    Frequency    Min 3X/week      PT Plan Current plan remains appropriate    Co-evaluation              AM-PAC PT "6 Clicks" Mobility   Outcome Measure  Help needed turning from your back to your side while in a flat bed without using bedrails?: A Little Help needed moving from lying on your back to sitting on the side of a flat bed without using bedrails?: A Lot Help needed moving to and from a bed to a chair (including a wheelchair)?: A Lot Help needed standing up from a chair using your arms (e.g., wheelchair or bedside chair)?: A Lot Help needed to walk in hospital room?: A Lot Help needed climbing 3-5 steps with a railing? : Total 6 Click Score: 12    End of Session  Equipment Utilized During Treatment: Oxygen Activity Tolerance: Patient tolerated treatment well Patient left: in chair;with call bell/phone within reach;with chair alarm set;with family/visitor present Nurse Communication: Mobility status (left pt on RA at 94% and pt needs a second suction machine set up as she is also on peri wick) PT Visit Diagnosis: Muscle weakness (generalized) (M62.81)     Time: 3086-5784 PT Time Calculation (min) (ACUTE ONLY): 43 min  Charges:  $Gait Training: 8-22 mins $Therapeutic Activity: 23-37 mins                     Rica Koyanagi  PTA Acute  Rehabilitation Services Pager      [phone] Office      [phone]

## 2019-10-17 NOTE — Care Plan (Signed)
Pt alert and aware sitting up in chair. Pt's daughter was present. The daughter said that her mother was doing better. The mom acknowledged that she felt better. The chaplain offered caring and supportive presence, prayers and blessings. Further visits will be offered.

## 2019-10-18 MED ORDER — ALPRAZOLAM 0.5 MG PO TABS: 0.5000 mg | ORAL_TABLET | Freq: Three times a day (TID) | ORAL | 0 refills | Status: DC | PRN

## 2019-10-18 MED ORDER — POLYETHYLENE GLYCOL 3350 17 G PO PACK: 17.0000 g | PACK | Freq: Every day | ORAL | 0 refills | Status: DC

## 2019-10-18 MED ORDER — APIXABAN 5 MG PO TABS: 5.0000 mg | ORAL_TABLET | Freq: Two times a day (BID) | ORAL | 0 refills | Status: DC

## 2019-10-18 MED ORDER — METOPROLOL TARTRATE 25 MG PO TABS: 25.0000 mg | ORAL_TABLET | Freq: Two times a day (BID) | ORAL | 0 refills | Status: DC

## 2019-10-18 MED ORDER — SODIUM CHLORIDE 3 % IN NEBU: 4.0000 mL | INHALATION_SOLUTION | Freq: Two times a day (BID) | RESPIRATORY_TRACT | 12 refills | Status: DC

## 2019-10-18 MED ORDER — GUAIFENESIN-DM 100-10 MG/5ML PO SYRP: 10.0000 mL | ORAL_SOLUTION | Freq: Four times a day (QID) | ORAL | 0 refills | Status: AC

## 2019-10-18 MED ORDER — ATORVASTATIN CALCIUM 80 MG PO TABS: 80.0000 mg | ORAL_TABLET | Freq: Every day | ORAL | 0 refills | Status: AC

## 2019-10-18 MED ORDER — IPRATROPIUM-ALBUTEROL 0.5-2.5 (3) MG/3ML IN SOLN: 3.0000 mL | Freq: Two times a day (BID) | RESPIRATORY_TRACT | 0 refills | Status: DC

## 2019-10-18 MED ORDER — AMOXICILLIN-POT CLAVULANATE 875-125 MG PO TABS: 1.0000 | ORAL_TABLET | Freq: Two times a day (BID) | ORAL | 0 refills | Status: DC

## 2019-10-18 NOTE — Progress Notes (Signed)
Air flow meter placed and Trach collar hooked up to room air.  Pt is on Trach collar for humidity, FIO2 is 21%(room air)

## 2019-10-18 NOTE — Progress Notes (Signed)
Pt. placed on Aerosol Trach Collar (ATC) for h/s after scheduled Med. aerosol tx., due to pt. having hx. of mucus plugs recently and having dry raspy cough, currently is not requiring oxygen.

## 2019-10-18 NOTE — TOC Transition Note (Addendum)
Transition of Care [facility]) - CM/SW Discharge Note   Patient Details  Name: Kashlyn Salinas MRN: [id number] Date of Birth: 10-14-1950  Transition of Care [facility]) CM/SW Contact:  Dessa Phi, RN Phone Number: 10/18/2019, 11:18 AM   Clinical Narrative: d/c home w/[facility] rep [place] aware. Noted not on 02-MD notified of home 02 ordered-if not needed to d/c order. [place]acn rep [employer]-aware of humidified air ordered for home-[employer] to deliver to rm prior d/c- patient has trach stoma. Nsg to manage transportation if other than [facility]. No further CM needs.     Final next level of care: [place] Barriers to Discharge: No Barriers Identified   Patient Goals and CMS Choice Patient states their goals for this hospitalization and ongoing recovery are:: go home CMS Medicare.gov Compare Post Acute Care list provided to:: Patient Represenative (must comment) (grand dtr [name]) Choice offered to / list presented to : Adult Children  Discharge Placement                       Discharge Plan and Services   Discharge Planning Services: CM Consult Post Acute Care Choice: Durable Medical Equipment          DME Arranged: Other see comment (home humidified air)   Date DME Agency Contacted: 10/18/19 Time DME Agency Contacted: [phone] Representative spoke with at DME Agency: [place]: [place] Agency: [place] ([place]) Date [place]: 10/12/19 Time [place]: 1118 Representative spoke with at [place]: [place] ([place]) Interventions     Readmission Risk Interventions No flowsheet data found.

## 2019-10-18 NOTE — TOC Progression Note (Addendum)
Transition of Care [facility]) - Progression Note    Patient Details  Name: Katelyn [name] MRN: [id number] Date of Birth: [birth year]  [facility] CM/SW Contact  Dickie Cloe, Juliann Pulse, RN Phone Number: 10/18/2019, 11:58 AM  Clinical Narrative:1:47p received call from grand dtr-[name] about d/c-informed her of [facility] services-[facility] set up;[employer] dme-humudified room air 5l to be delivered to home along with all supplies. Family has own transportation home. Informed if medical concerns to contact the floor for the nurse who can reach the dr-grand dtr [name] voiced understnading. No further CM needs.  Noted for home humidified air-ordered. [employer] will deliver dme & supplies to home. No further CM needs.  Expected Discharge Plan: [place] Barriers to Discharge: No Barriers Identified  Expected Discharge Plan and Services Expected Discharge Plan: [place]   Discharge Planning Services: CM Consult Post Acute Care Choice: Durable Medical Equipment Living arrangements for the past 2 months: Single Family Home Expected Discharge Date: 10/18/19               DME Arranged: Other see comment (home humidified air)   Date DME Agency Contacted: 10/18/19 Time DME Agency Contacted: [id number] Representative spoke with at DME Agency: [place]: [place] Agency: [place] ([place]) Date [place]: 10/12/19 Time [place]: [id number] Representative spoke with at [place]: [place] ([place]) Interventions    Readmission Risk Interventions No flowsheet data found.

## 2019-10-18 NOTE — Discharge Summary (Signed)
Physician Discharge Summary  Katelyn [name] KJZ:[id number] DOB: [birth year] DOA: 10/06/2019  PCP: Aretta Nip, MD  Admit date: 10/06/2019 Discharge date: 10/18/2019  Recommendations for Outpatient Follow-up:  1. Discharge to home with home health PT/OT, RN, RT, and palliative care 2. Follow up with PCP in 7-10 days 3. Have chemistry and CBC checked on that visit. 4. Drink plenty of water to help keep secretions thin. 5. Humidified Room Air 5L by trach collar. 6. Follow up with Dr. Vicie Mutters, ENT at [place], [place] Follow up.   Specialty: [place] Why: [facility] nursing/physical/occupational therapy/respiratory therapy       [facility] Follow up.   Why: humidified room air 5l to trach collar.             Discharge Diagnoses: Principal diagnosis is #1 1. Acute hypoxic and hypercarbic respiratory failure  2. COPD exacerbation 3. Bilateral basilar pneumonia, aspiration pneumonia 4. Laryngeal cancer s/p tracheostomy, trach has been discontinued. Now just stoma. 5. PE, History of DVT, no on eliquis 6. Chronic diastolic congestive heart failure 7. AKI 8. History of nonhemorrhagic stroke with resultant mild right hemiparesis. 9. Hyperlipidemia 10. Cutaneous candidiasis 11. Anxiety 12. Generalized weakness. 13. Ambulatory dysfunction  Discharge Condition: Fair  Disposition: Home with home health PT/OT RN, RT  Diet recommendation: Heart healthy  Filed Weights   10/06/19 2140 10/07/19 1402  Weight: 65.8 kg 61.3 kg    History of present illness:  Katelyn [name] is a 70 y.o. female with medical history significant of laryngeal cancer status post total laryngectomy status post neck dissection and  currently on a stoma, hypertension, hyperlipidemia, nonhemorrhagic CVA, right leg DVT, hip fracture, diastolic CHF who presents to the emergency department with complaint of shortness of breath and cough that  started yesterday.  Since she became increasingly dyspneic, she was brought to the emergency department.   Patient lives with her boyfriend.  In the emergency department she was accompanied by her daughter.  As per the daughter, she started having severe shortness of breath, cough since yesterday.  Cough is dry.  She does not use oxygen at home.  Patient previously had a tracheostomy but currently without a tube and just has a  stoma .  She is extremely noncompliant and has not taken any medications for last 1 year nor has seen a doctor.  She is a previous smoker.  There is no history of asthma or COPD documented on her chart.  She had her first Covid vaccine about 10 days ago.  Patient ambulates with the help of walker but has trouble with gait. Patient seen and examined at the bedside in the emergency department.  Currently she was on 2 L of oxygen per minute.  She looked short of breath and tachypneic.  There is no report of fever, chills nausea, vomiting, headache, hematochezia, melena, dysuria  ED Course: Patient was tachypneic and tachycardic on presentation.  Requiring 5 to 6 L of oxygen by trach collar.  She was given a dose of Solu-Medrol.  Found to be  hypertensive.  ABG showed PCO2 of 60.  Procalcitonin negative.  Elevated CRP.  D-dimer was elevated.  Chest x-ray showed mild right basilar atelectasis.  CT angiogram of the chest did not show any PE, did not show any pneumonia or effusion.  Patient was admitted for the management of COPD exacerbation.  Hospital Course: Katelyn Hamiltonis a 69 y.o.femalewith medical history significant oflaryngeal cancer status post  total laryngectomy status post neck dissectionandcurrently on a stoma, hypertension, hyperlipidemia, nonhemorrhagic CVA, right leg DVT, hip fracture, diastolic CHF who presents to the emergency department with complaint of shortness of breath and cough . She was admitted for the management of possible COPD exacerbation. Hospital  course remarkable for persistent requirement of high amount of oxygen for maintenance of saturation.PCCM also consulted and following. Palliative care consulted for goals of care discussion due to her noncompliance, persistent oxygen requirement, high chance of decompensation in the near future.  Palliative care met with the patient and her daughter, Katelyn [name] on 10/12/2019. They determined that for the time being the patient would continue to be Full Code with full scope of treatment. They would continue palliative discussions for goals of care as outpatient. The plan would be for the patient to go home with palliative services.  She has been evaluated by ST. They have cleared her for regular consistency diet with thin liquids. They have signed off.  Due to concerns regarding the patient's need for suctioning twice a day due to plugging of her stoma, PCCM was reconsulted. They feel that the stoma is irritated due to frequent suctioning. They recommended continuation of guaifenesin, and keeping the patient and stoma well hydrated to prevent any new problems. No further investigation necessary.  For the past 24 hours that patient has been saturating at the mid to high nineties on room air. She will be discharged to home with home health  Today's assessment: S: The patitent is resting comfortably. No new complaints. O: Vitals:  Vitals:   10/18/19 0728 10/18/19 1323  BP:  113/77  Pulse:  79  Resp:  16  Temp:  (!) 97.5 F (36.4 C)  SpO2: 94% 93%   Constitutional:   The patient is awake, alert, and oriented x 3. No acute distress. Neck:   Trach site functioning. Mild erythema and bleeding from stoma site appears resolved.  no thyromegaly Respiratory:   No increased work of breathing.  No wheezes, rales, or rhonchi  No tactile fremitus Cardiovascular:   Regular rate and rhythm  No murmurs, ectopy, or gallups.  No lateral PMI. No thrills. Abdomen:   Abdomen is soft,  non-tender, non-distended  No hernias, masses, or organomegaly  Normoactive bowel sounds.  Musculoskeletal:   No cyanosis, clubbing, or edema Skin:   No rashes, lesions, ulcers  palpation of skin: no induration or nodules Neurologic:   CN 2-12 intact  Sensation all 4 extremities intact Psychiatric:   Mental status ? Mood, affect appropriate ? Orientation to person, place, time   judgment and insight appear intact  Discharge Instructions  Discharge Instructions    Activity as tolerated - No restrictions   Complete by: As directed    Amb Referral to Palliative Care   Complete by: As directed    Call MD for:  difficulty breathing, headache or visual disturbances   Complete by: As directed    Call MD for:  temperature >100.4   Complete by: As directed    Diet - low sodium heart healthy   Complete by: As directed    Discharge instructions   Complete by: As directed    Discharge to home with home health PT/OT, RN, RT, and palliative care Follow up with PCP in 7-10 days Have chemistry and CBC checked on that visit. Drink plenty of water to help keep secretions thin. Humidified Room Air 5L by trach collar.   Increase activity slowly   Complete by: As directed  Allergies as of 10/18/2019   No Known Allergies     Medication List    TAKE these medications   acetaminophen 160 MG/5ML liquid Commonly known as: TYLENOL Take 15.6 mLs (500 mg total) by mouth every 6 (six) hours as needed for pain.   albuterol 108 (90 Base) MCG/ACT inhaler Commonly known as: VENTOLIN HFA Inhale 2 puffs into the lungs every 4 (four) hours as needed for wheezing or shortness of breath.   albuterol (2.5 MG/3ML) 0.083% nebulizer solution Commonly known as: PROVENTIL Take 3 mLs (2.5 mg total) by nebulization every 6 (six) hours as needed for wheezing or shortness of breath.   ALPRAZolam 0.5 MG tablet Commonly known as: XANAX Take 1 tablet (0.5 mg total) by mouth 3 (three) times  daily as needed for anxiety.   amoxicillin-clavulanate 875-125 MG tablet Commonly known as: AUGMENTIN Take 1 tablet by mouth every 12 (twelve) hours.   apixaban 5 MG Tabs tablet Commonly known as: ELIQUIS Take 1 tablet (5 mg total) by mouth 2 (two) times daily.   atorvastatin 80 MG tablet Commonly known as: LIPITOR Take 1 tablet (80 mg total) by mouth daily.   guaiFENesin-dextromethorphan 100-10 MG/5ML syrup Commonly known as: ROBITUSSIN DM Take 10 mLs by mouth every 6 (six) hours.   ipratropium-albuterol 0.5-2.5 (3) MG/3ML Soln Commonly known as: DUONEB Take 3 mLs by nebulization 2 (two) times daily.   metoprolol tartrate 25 MG tablet Commonly known as: LOPRESSOR Take 1 tablet (25 mg total) by mouth 2 (two) times daily.   nystatin cream Commonly known as: MYCOSTATIN Apply to affected area 2 times daily   polyethylene glycol 17 g packet Commonly known as: MIRALAX / GLYCOLAX Take 17 g by mouth daily.   sodium chloride HYPERTONIC 3 % nebulizer solution Take 4 mLs by nebulization 2 (two) times daily.            Durable Medical Equipment  (From admission, onward)         Start     Ordered   10/18/19 1216  For home use only DME Other see comment  Once       Comments: Home humidifier ;5l permitted of room air, & supplies.  Question:  Length of Need  Answer:  Lifetime   10/18/19 1217   10/11/19 1313  For home use only DME oxygen  Once       Question Answer Comment  Length of Need Lifetime   Mode or (Route) Nasal cannula   Liters per Minute 5   Frequency Continuous (stationary and portable oxygen unit needed)   Oxygen delivery system Gas      10/11/19 1313         No Known Allergies  The results of significant diagnostics from this hospitalization (including imaging, microbiology, ancillary and laboratory) are listed below for reference.    Significant Diagnostic Studies: CT Angio Chest PE W/Cm &/Or Wo Cm  Result Date: 10/06/2019 CLINICAL DATA:   Shortness of breath.  PE suspected. EXAM: CT ANGIOGRAPHY CHEST WITH CONTRAST TECHNIQUE: Multidetector CT imaging of the chest was performed using the standard protocol during bolus administration of intravenous contrast. Multiplanar CT image reconstructions and MIPs were obtained to evaluate the vascular anatomy. CONTRAST:  46m OMNIPAQUE IOHEXOL 350 MG/ML SOLN COMPARISON:  CT angiogram chest dated 04/12/2018 FINDINGS: Cardiovascular: The most peripheral segmental and subsegmental pulmonary arteries bilaterally are difficult to characterize due to patient breathing motion artifact, however, there is no pulmonary embolism identified within the main, lobar or central segmental  pulmonary arteries bilaterally. Heart size appears stable. No pericardial effusion. No thoracic aortic aneurysm or evidence of aortic dissection. Aortic atherosclerosis. Mediastinum/Nodes: No mass or enlarged lymph nodes are seen within the mediastinum or perihilar regions. Small hiatal hernia. Esophagus is unremarkable. Tracheostomy. Lungs/Pleura: Mild bibasilar atelectasis. Lungs otherwise clear. No pleural effusion or pneumothorax. Upper Abdomen: Limited images of the upper abdomen are unremarkable. Musculoskeletal: No acute or suspicious osseous finding. Review of the MIP images confirms the above findings. IMPRESSION: 1. No acute findings. No pulmonary embolism seen, with study limitations detailed above. No pneumonia or pulmonary edema. 2. Small hiatal hernia. Aortic Atherosclerosis (ICD10-I70.0). Electronically Signed   By: Franki Cabot M.D.   On: 10/06/2019 14:50   NM Pulmonary Perfusion  Result Date: 10/09/2019 CLINICAL DATA:  Respiratory failure EXAM: NUCLEAR MEDICINE PERFUSION LUNG SCAN TECHNIQUE: Perfusion images were obtained in multiple projections after intravenous injection of radiopharmaceutical. Ventilation scans intentionally deferred if perfusion scan and chest x-ray adequate for interpretation during COVID 19 epidemic.  RADIOPHARMACEUTICALS:  4.2 mCi Tc-58mMAA IV COMPARISON:  chest radiograph of 10/09/2019 FINDINGS: Right mid lung perfusion defect laterally is somewhat wedge-shaped, and is without correlate on chest radiograph. The left lung perfuses normally. IMPRESSION: Right mid lung perfusion defect laterally, for which pulmonary embolism cannot be excluded. Consider lower extremity ultrasound or if feasible, CTA of the chest. Electronically Signed   By: KAbigail MiyamotoM.D.   On: 10/09/2019 12:09   DG CHEST PORT 1 VIEW  Result Date: 10/17/2019 CLINICAL DATA:  Oxygen desaturation. History of COPD and laryngeal cancer. EXAM: PORTABLE CHEST 1 VIEW COMPARISON:  10/10/2019 FINDINGS: The cardiac silhouette is upper limits of normal in size. Aortic atherosclerosis is noted. The lungs are better inflated than on the prior study with improved aeration of the lung bases. There is mild residual linear opacity in both lung bases compatible with atelectasis. The upper lungs are clear. No edema, sizable pleural effusion, pneumothorax is identified. Surgical clips are noted in the lower neck. IMPRESSION: Improved lung aeration with mild bibasilar atelectasis. Electronically Signed   By: ALogan BoresM.D.   On: 10/17/2019 11:55   DG CHEST PORT 1 VIEW  Result Date: 10/10/2019 CLINICAL DATA:  Fever EXAM: PORTABLE CHEST 1 VIEW COMPARISON:  October 09, 2018 FINDINGS: Apparent tracheostomy. Postoperative changes noted in the neck and upper thoracic regions. No evident pneumothorax there is new atelectatic change in the lung bases with ill-defined airspace opacity in these areas and questionable small right pleural effusion. Lungs elsewhere are clear. Heart size and pulmonary vascular normal. No adenopathy. There is aortic atherosclerosis. No adenopathy. No bone lesions. IMPRESSION: Atelectatic change in the lung bases with suspected developing pneumonia bilaterally in these areas. Questionable small right pleural effusion. Stable cardiac  silhouette. Postoperative change in the neck and upper chest region with apparent tracheostomy. No pneumothorax. Aortic Atherosclerosis (ICD10-I70.0). Electronically Signed   By: WLowella GripIII M.D.   On: 10/10/2019 09:19   DG CHEST PORT 1 VIEW  Result Date: 10/09/2019 CLINICAL DATA:  Hypoxia prior to V/Q scan. EXAM: PORTABLE CHEST 1 VIEW COMPARISON:  10/07/2019 FINDINGS: No focal consolidation. No pleural effusion or pneumothorax. Stable cardiomegaly. No acute osseous abnormality. IMPRESSION: No active disease. Electronically Signed   By: HKathreen Devoid  On: 10/09/2019 12:57   DG Chest Portable 1 View  Result Date: 10/07/2019 CLINICAL DATA:  Shortness of breath.  Progressive hypoxia. EXAM: PORTABLE CHEST 1 VIEW COMPARISON:  One-view chest x-ray and CTA chest 10/06/2019 FINDINGS: The heart  is enlarged scratched at the heart size is normal. Progressive pulmonary vascular congestion is present. Progressive bibasilar airspace opacities are noted. No pneumothorax is present. IMPRESSION: Progressive pulmonary vascular congestion and bibasilar airspace disease, concerning for edema or infection. Electronically Signed   By: San Morelle M.D.   On: 10/07/2019 06:13   Portable chest 1 View  Result Date: 10/06/2019 CLINICAL DATA:  Worsening cough and shortness of breath EXAM: PORTABLE CHEST 1 VIEW COMPARISON:  04/12/2018 FINDINGS: Cardiac shadow is stable. Aortic calcifications are seen. Tortuosity of the aorta is seen. No focal infiltrate or sizable effusion is noted. Minimal basilar atelectasis is noted on the right. No bony abnormality is seen. IMPRESSION: Mild right basilar atelectasis. Electronically Signed   By: Inez Catalina M.D.   On: 10/06/2019 09:13   ECHOCARDIOGRAM COMPLETE  Result Date: 10/07/2019    ECHOCARDIOGRAM REPORT   Patient Name:   AINARA ELDRIDGE Date of Exam: 10/07/2019 Medical Rec #:  [id number]         Height:       59.0 in Accession #:    [id number]        Weight:        145.0 lb Date of Birth:  1951-01-28         BSA:          1.609 m Patient Age:    29 years          BP:           111/83 mmHg Patient Gender: F                 HR:           102 bpm. Exam Location:  Inpatient Procedure: 2D Echo, Color Doppler and Cardiac Doppler Indications:    I33.82 Acute diastolic (congestive) heart failure  History:        Patient has prior history of Echocardiogram examinations, most                 recent 06/05/2017. COPD; Risk Factors:Hypertension and                 Dyslipidemia.  Sonographer:    Raquel Sarna Senior RDCS Referring Phys: [phone] AMRIT ADHIKARI  Sonographer Comments: Very technically difficult due to patient body habitus. IMPRESSIONS  1. Left ventricular ejection fraction, by estimation, is 60 to 65%. The left ventricle has normal function. The left ventricle has no regional wall motion abnormalities. There is mild left ventricular hypertrophy of the basal-septal segment. Left ventricular diastolic parameters are consistent with Grade I diastolic dysfunction (impaired relaxation).  2. Right ventricular systolic function is normal. The right ventricular size is normal. There is mildly elevated pulmonary artery systolic pressure.  3. The mitral valve is normal in structure. No evidence of mitral valve regurgitation. No evidence of mitral stenosis.  4. The aortic valve is normal in structure. Aortic valve regurgitation is mild. Mild aortic valve stenosis.  5. The inferior vena cava is normal in size with greater than 50% respiratory variability, suggesting right atrial pressure of 3 mmHg. FINDINGS  Left Ventricle: Left ventricular ejection fraction, by estimation, is 60 to 65%. The left ventricle has normal function. The left ventricle has no regional wall motion abnormalities. The left ventricular internal cavity size was normal in size. There is  mild left ventricular hypertrophy of the basal-septal segment. Left ventricular diastolic parameters are consistent with Grade I diastolic  dysfunction (impaired relaxation). Indeterminate filling pressures. Right Ventricle: The right ventricular size  is normal. No increase in right ventricular wall thickness. Right ventricular systolic function is normal. There is mildly elevated pulmonary artery systolic pressure. The tricuspid regurgitant velocity is 2.66  m/s, and with an assumed right atrial pressure of 8 mmHg, the estimated right ventricular systolic pressure is 32.9 mmHg. Left Atrium: Left atrial size was normal in size. Right Atrium: Right atrial size was normal in size. Pericardium: There is no evidence of pericardial effusion. Mitral Valve: The mitral valve is normal in structure. Normal mobility of the mitral valve leaflets. No evidence of mitral valve regurgitation. No evidence of mitral valve stenosis. Tricuspid Valve: The tricuspid valve is normal in structure. Tricuspid valve regurgitation is not demonstrated. No evidence of tricuspid stenosis. Aortic Valve: The aortic valve is normal in structure. Aortic valve regurgitation is mild. Aortic regurgitation PHT measures 326 msec. Mild aortic stenosis is present. Aortic valve mean gradient measures 8.0 mmHg. Aortic valve peak gradient measures 16.6  mmHg. Aortic valve area, by VTI measures 1.37 cm. Pulmonic Valve: The pulmonic valve was normal in structure. Pulmonic valve regurgitation is not visualized. No evidence of pulmonic stenosis. Aorta: The aortic root is normal in size and structure. Venous: The inferior vena cava is normal in size with greater than 50% respiratory variability, suggesting right atrial pressure of 3 mmHg. IAS/Shunts: No atrial level shunt detected by color flow Doppler.  LEFT VENTRICLE PLAX 2D LVIDd:         3.40 cm  Diastology LVIDs:         2.20 cm  LV e' lateral:   4.35 cm/s LV PW:         1.00 cm  LV E/e' lateral: 15.3 LV IVS:        1.40 cm  LV e' medial:    8.05 cm/s LVOT diam:     1.98 cm  LV E/e' medial:  8.2 LV SV:         41 LV SV Index:   25 LVOT Area:      3.08 cm  RIGHT VENTRICLE RV S prime:     8.59 cm/s LEFT ATRIUM             Index LA diam:        3.20 cm 1.99 cm/m LA Vol (A2C):   21.6 ml 13.43 ml/m LA Vol (A4C):   28.8 ml 17.90 ml/m LA Biplane Vol: 26.4 ml 16.41 ml/m  AORTIC VALVE AV Area (Vmax):    1.15 cm AV Area (Vmean):   1.29 cm AV Area (VTI):     1.37 cm AV Vmax:           203.85 cm/s AV Vmean:          129.820 cm/s AV VTI:            0.298 m AV Peak Grad:      16.6 mmHg AV Mean Grad:      8.0 mmHg LVOT Vmax:         76.03 cm/s LVOT Vmean:        54.469 cm/s LVOT VTI:          0.133 m LVOT/AV VTI ratio: 0.45 AI PHT:            326 msec  AORTA Ao Root diam: 3.20 cm Ao Asc diam:  2.90 cm MITRAL VALVE                TRICUSPID VALVE MV Area (PHT): 3.31 cm     TR Peak  grad:   28.3 mmHg MV Decel Time: 229 msec     TR Vmax:        266.00 cm/s MV E velocity: 66.40 cm/s MV A velocity: 120.00 cm/s  SHUNTS MV E/A ratio:  0.55         Systemic VTI:  0.13 m                             Systemic Diam: 1.98 cm Dani Gobble Croitoru MD Electronically signed by Sanda Klein MD Signature Date/Time: 10/07/2019/1:39:36 PM    Final    VAS [place] LOWER EXTREMITY VENOUS (DVT)  Result Date: 10/08/2019  Lower Venous DVTStudy Limitations: Size and depth of vessels, patient inability to position. Comparison Study: Prior study 03-02-2014 was positive for DVT in RT Performing Technologist: Darlin Coco  Examination Guidelines: A complete evaluation includes B-mode imaging, spectral Doppler, color Doppler, and power Doppler as needed of all accessible portions of each vessel. Bilateral testing is considered an integral part of a complete examination. Limited examinations for reoccurring indications may be performed as noted. The reflux portion of the exam is performed with the patient in reverse Trendelenburg.  +---------+---------------+---------+-----------+-----------------+------------+ RIGHT    CompressibilityPhasicitySpontaneityProperties       Thrombus                                                                   Aging        +---------+---------------+---------+-----------+-----------------+------------+ CFV      Full           Yes      Yes                                      +---------+---------------+---------+-----------+-----------------+------------+ SFJ      Full                                                             +---------+---------------+---------+-----------+-----------------+------------+ FV Prox  Full                                                             +---------+---------------+---------+-----------+-----------------+------------+ FV Mid   None                                                Chronic      +---------+---------------+---------+-----------+-----------------+------------+ FV DistalNone                                                Chronic      +---------+---------------+---------+-----------+-----------------+------------+  PFV      Full                                                             +---------+---------------+---------+-----------+-----------------+------------+ POP      None                               partially        Chronic                                                  re-cannalized                 +---------+---------------+---------+-----------+-----------------+------------+ PTV      Full                                                             +---------+---------------+---------+-----------+-----------------+------------+ PERO     Full                                                             +---------+---------------+---------+-----------+-----------------+------------+   +---------+---------------+---------+-----------+----------+--------------+ LEFT     CompressibilityPhasicitySpontaneityPropertiesThrombus Aging +---------+---------------+---------+-----------+----------+--------------+ CFV      Full           Yes       Yes                                 +---------+---------------+---------+-----------+----------+--------------+ SFJ      Full                                                        +---------+---------------+---------+-----------+----------+--------------+ FV Prox  Full                                                        +---------+---------------+---------+-----------+----------+--------------+ FV Mid   Full                                                        +---------+---------------+---------+-----------+----------+--------------+ FV DistalFull                                                        +---------+---------------+---------+-----------+----------+--------------+  PFV      Full                                                        +---------+---------------+---------+-----------+----------+--------------+ POP      Full           Yes      Yes                                 +---------+---------------+---------+-----------+----------+--------------+ PTV      Full                                                        +---------+---------------+---------+-----------+----------+--------------+ PERO     Full                                                        +---------+---------------+---------+-----------+----------+--------------+     Summary: RIGHT: - Findings consistent with chronic deep vein thrombosis involving the right femoral vein, and right popliteal vein. - No cystic structure found in the popliteal fossa.  LEFT: - There is no evidence of deep vein thrombosis in the lower extremity.  - No cystic structure found in the popliteal fossa.  *See table(s) above for measurements and observations. Electronically signed by Ruta Hinds MD on 10/08/2019 at 12:05:37 PM.    Final     Microbiology: No results found for this or any previous visit (from the past 240 hour(s)).   Labs: Basic Metabolic Panel: Recent Labs  Lab  10/12/19 0544 10/14/19 0505 10/15/19 0602 10/17/19 0547  NA 141 142 141 142  K 4.0 4.6 4.2 4.5  CL 106 105 105 105  CO2 _0 GLUCOSE 112* 92 84 80  BUN 51* 43* 38* 30*  CREATININE 1.32* 1.23* 1.13* 1.03*  CALCIUM 8.8* 9.3 8.8* 9.1   Liver Function Tests: No results for input(s): AST, ALT, ALKPHOS, BILITOT, PROT, ALBUMIN in the last 168 hours. No results for input(s): LIPASE, AMYLASE in the last 168 hours. No results for input(s): AMMONIA in the last 168 hours. CBC: Recent Labs  Lab 10/15/19 0602 10/17/19 0547  WBC 9.5 10.3  NEUTROABS  --  7.1  HGB 11.7* 11.9*  HCT 39.2 39.7  MCV 96.1 96.8  PLT 272 257   Cardiac Enzymes: No results for input(s): CKTOTAL, CKMB, CKMBINDEX, TROPONINI in the last 168 hours. BNP: BNP (last 3 results) Recent Labs    10/07/19 1440  BNP 91.3    ProBNP (last 3 results) No results for input(s): PROBNP in the last 8760 hours.  CBG: No results for input(s): GLUCAP in the last 168 hours.  Principal Problem:   Acute respiratory failure with hypoxia (HCC) Active Problems:   Hypercholesteremia   Essential hypertension   Cerebral infarction (HCC)   Diastolic dysfunction   Right leg DVT (HCC)   Generalized weakness   COPD exacerbation (HCC)   History of noncompliance with medical treatment  Goals of care, counseling/discussion   Palliative care by specialist   Time coordinating discharge: 38 minutes.  Signed:        Ava Swayze, DO [facility]  10/18/2019, 2:06 PM

## 2019-10-18 NOTE — Progress Notes (Signed)
Pt coughed up large mucus plug (bloody/clot like).  RT used saline flush to help clear plug.

## 2019-10-20 ENCOUNTER — Encounter (HOSPITAL_COMMUNITY): Payer: Self-pay | Admitting: Internal Medicine

## 2019-10-20 ENCOUNTER — Emergency Department (HOSPITAL_COMMUNITY): Payer: Medicare Other

## 2019-10-20 ENCOUNTER — Other Ambulatory Visit: Payer: Self-pay

## 2019-10-20 ENCOUNTER — Inpatient Hospital Stay (HOSPITAL_COMMUNITY)
Admission: EM | Admit: 2019-10-20 | Discharge: 2019-10-25 | DRG: 193 | Disposition: A | Discharge status: Rehabilitation Facility | Payer: Medicare Other | Attending: Internal Medicine | Admitting: Internal Medicine

## 2019-10-20 DIAGNOSIS — R0602 Shortness of breath: Secondary | ICD-10-CM

## 2019-10-20 DIAGNOSIS — T17800A Unspecified foreign body in other parts of respiratory tract causing asphyxiation, initial encounter: Secondary | ICD-10-CM

## 2019-10-20 DIAGNOSIS — J9601 Acute respiratory failure with hypoxia: Secondary | ICD-10-CM | POA: Diagnosis present

## 2019-10-20 DIAGNOSIS — Z87891 Personal history of nicotine dependence: Secondary | ICD-10-CM

## 2019-10-20 DIAGNOSIS — J441 Chronic obstructive pulmonary disease with (acute) exacerbation: Secondary | ICD-10-CM | POA: Diagnosis present

## 2019-10-20 DIAGNOSIS — E7800 Pure hypercholesterolemia, unspecified: Secondary | ICD-10-CM | POA: Diagnosis present

## 2019-10-20 DIAGNOSIS — Z930 Tracheostomy status: Secondary | ICD-10-CM

## 2019-10-20 DIAGNOSIS — Z90710 Acquired absence of both cervix and uterus: Secondary | ICD-10-CM

## 2019-10-20 DIAGNOSIS — J9600 Acute respiratory failure, unspecified whether with hypoxia or hypercapnia: Secondary | ICD-10-CM | POA: Diagnosis not present

## 2019-10-20 DIAGNOSIS — J440 Chronic obstructive pulmonary disease with acute lower respiratory infection: Secondary | ICD-10-CM | POA: Diagnosis present

## 2019-10-20 DIAGNOSIS — X58XXXA Exposure to other specified factors, initial encounter: Secondary | ICD-10-CM | POA: Diagnosis present

## 2019-10-20 DIAGNOSIS — I10 Essential (primary) hypertension: Secondary | ICD-10-CM | POA: Diagnosis present

## 2019-10-20 DIAGNOSIS — Z20822 Contact with and (suspected) exposure to covid-19: Secondary | ICD-10-CM | POA: Diagnosis present

## 2019-10-20 DIAGNOSIS — E785 Hyperlipidemia, unspecified: Secondary | ICD-10-CM | POA: Diagnosis present

## 2019-10-20 DIAGNOSIS — I69351 Hemiplegia and hemiparesis following cerebral infarction affecting right dominant side: Secondary | ICD-10-CM

## 2019-10-20 DIAGNOSIS — Z86711 Personal history of pulmonary embolism: Secondary | ICD-10-CM

## 2019-10-20 DIAGNOSIS — Z86718 Personal history of other venous thrombosis and embolism: Secondary | ICD-10-CM

## 2019-10-20 DIAGNOSIS — I5189 Other ill-defined heart diseases: Secondary | ICD-10-CM

## 2019-10-20 DIAGNOSIS — T17590A Other foreign object in bronchus causing asphyxiation, initial encounter: Secondary | ICD-10-CM | POA: Diagnosis not present

## 2019-10-20 DIAGNOSIS — N183 Chronic kidney disease, stage 3 unspecified: Secondary | ICD-10-CM | POA: Diagnosis present

## 2019-10-20 DIAGNOSIS — J9809 Other diseases of bronchus, not elsewhere classified: Secondary | ICD-10-CM | POA: Diagnosis not present

## 2019-10-20 DIAGNOSIS — R531 Weakness: Secondary | ICD-10-CM

## 2019-10-20 DIAGNOSIS — I129 Hypertensive chronic kidney disease with stage 1 through stage 4 chronic kidney disease, or unspecified chronic kidney disease: Secondary | ICD-10-CM | POA: Diagnosis present

## 2019-10-20 DIAGNOSIS — C139 Malignant neoplasm of hypopharynx, unspecified: Secondary | ICD-10-CM | POA: Diagnosis present

## 2019-10-20 DIAGNOSIS — Y95 Nosocomial condition: Secondary | ICD-10-CM | POA: Diagnosis present

## 2019-10-20 DIAGNOSIS — Z85819 Personal history of malignant neoplasm of unspecified site of lip, oral cavity, and pharynx: Secondary | ICD-10-CM

## 2019-10-20 DIAGNOSIS — N1831 Chronic kidney disease, stage 3a: Secondary | ICD-10-CM | POA: Diagnosis present

## 2019-10-20 DIAGNOSIS — J189 Pneumonia, unspecified organism: Secondary | ICD-10-CM | POA: Diagnosis not present

## 2019-10-20 DIAGNOSIS — Z79899 Other long term (current) drug therapy: Secondary | ICD-10-CM

## 2019-10-20 DIAGNOSIS — Z7951 Long term (current) use of inhaled steroids: Secondary | ICD-10-CM

## 2019-10-20 DIAGNOSIS — Z7901 Long term (current) use of anticoagulants: Secondary | ICD-10-CM

## 2019-10-20 LAB — COMPREHENSIVE METABOLIC PANEL
ALT: 20 U/L (ref 0–44)
AST: 22 U/L (ref 15–41)
Albumin: 3.2 g/dL — ABNORMAL LOW (ref 3.5–5.0)
Alkaline Phosphatase: 90 U/L (ref 38–126)
Anion gap: 11 (ref 5–15)
BUN: 17 mg/dL (ref 8–23)
CO2: 25 mmol/L (ref 22–32)
Calcium: 9.1 mg/dL (ref 8.9–10.3)
Chloride: 103 mmol/L (ref 98–111)
Creatinine, Ser: 1.12 mg/dL — ABNORMAL HIGH (ref 0.44–1.00)
GFR calc Af Amer: 58 mL/min — ABNORMAL LOW (ref >60)
GFR calc non Af Amer: 50 mL/min — ABNORMAL LOW (ref >60)
Glucose, Bld: 120 mg/dL — ABNORMAL HIGH (ref 70–99)
Potassium: 4.1 mmol/L (ref 3.5–5.1)
Sodium: 139 mmol/L (ref 135–145)
Total Bilirubin: 0.7 mg/dL (ref 0.3–1.2)
Total Protein: 7.1 g/dL (ref 6.5–8.1)

## 2019-10-20 LAB — CBC WITH DIFFERENTIAL/PLATELET
Abs Immature Granulocytes: 0.12 10*3/uL — ABNORMAL HIGH (ref 0.00–0.07)
Basophils Absolute: 0 10*3/uL (ref 0.0–0.1)
Basophils Relative: 0 %
Eosinophils Absolute: 0 10*3/uL (ref 0.0–0.5)
Eosinophils Relative: 0 %
HCT: 40.8 % (ref 36.0–46.0)
Hemoglobin: 12 g/dL (ref 12.0–15.0)
Immature Granulocytes: 1 %
Lymphocytes Relative: 11 %
Lymphs Abs: 1.4 10*3/uL (ref 0.7–4.0)
MCH: 28.2 pg (ref 26.0–34.0)
MCHC: 29.4 g/dL — ABNORMAL LOW (ref 30.0–36.0)
MCV: 96 fL (ref 80.0–100.0)
Monocytes Absolute: 0.5 10*3/uL (ref 0.1–1.0)
Monocytes Relative: 4 %
Neutro Abs: 11.6 10*3/uL — ABNORMAL HIGH (ref 1.7–7.7)
Neutrophils Relative %: 84 %
Platelets: 286 10*3/uL (ref 150–400)
RBC: 4.25 MIL/uL (ref 3.87–5.11)
RDW: 12.4 % (ref 11.5–15.5)
WBC: 13.7 10*3/uL — ABNORMAL HIGH (ref 4.0–10.5)
nRBC: 0 % (ref 0.0–0.2)

## 2019-10-20 LAB — SARS CORONAVIRUS 2 BY RT PCR (HOSPITAL ORDER, PERFORMED IN ~~LOC~~ HOSPITAL LAB): SARS Coronavirus 2: NEGATIVE

## 2019-10-20 LAB — PROCALCITONIN: Procalcitonin: <0.1 ng/mL

## 2019-10-20 LAB — MRSA PCR SCREENING: MRSA by PCR: NEGATIVE

## 2019-10-20 MED ORDER — SODIUM CHLORIDE 0.9 % IV SOLN
2.0000 g | Freq: Once | INTRAVENOUS | Status: AC
  Administered 2019-10-20: 2 g via INTRAVENOUS
  Filled 2019-10-20: qty 2

## 2019-10-20 MED ORDER — IPRATROPIUM BROMIDE 0.02 % IN SOLN
0.5000 mg | Freq: Once | RESPIRATORY_TRACT | Status: AC
  Administered 2019-10-20: 0.5 mg via RESPIRATORY_TRACT
  Filled 2019-10-20: qty 2.5

## 2019-10-20 MED ORDER — APIXABAN 5 MG PO TABS
5.0000 mg | ORAL_TABLET | Freq: Two times a day (BID) | ORAL | Status: DC
  Administered 2019-10-20 – 2019-10-25 (×11): 5 mg via ORAL
  Filled 2019-10-20 (×11): qty 1

## 2019-10-20 MED ORDER — DIAZEPAM 5 MG/ML IJ SOLN
2.5000 mg | Freq: Once | INTRAMUSCULAR | Status: AC
  Administered 2019-10-20: 2.5 mg via INTRAVENOUS
  Filled 2019-10-20: qty 2

## 2019-10-20 MED ORDER — ACETYLCYSTEINE 20 % IN SOLN
2.0000 mL | Freq: Four times a day (QID) | RESPIRATORY_TRACT | Status: DC
  Administered 2019-10-20 – 2019-10-23 (×10): 2 mL via RESPIRATORY_TRACT
  Filled 2019-10-20 (×15): qty 4

## 2019-10-20 MED ORDER — ACETAMINOPHEN 325 MG PO TABS: 650.0000 mg | ORAL_TABLET | Freq: Four times a day (QID) | ORAL | Status: DC | PRN

## 2019-10-20 MED ORDER — ALBUTEROL SULFATE (2.5 MG/3ML) 0.083% IN NEBU
2.5000 mg | INHALATION_SOLUTION | Freq: Four times a day (QID) | RESPIRATORY_TRACT | Status: DC
  Administered 2019-10-20 – 2019-10-23 (×12): 2.5 mg via RESPIRATORY_TRACT
  Filled 2019-10-20 (×11): qty 3

## 2019-10-20 MED ORDER — VANCOMYCIN HCL IN DEXTROSE 1-5 GM/200ML-% IV SOLN: 1000.0000 mg | INTRAVENOUS | Status: DC

## 2019-10-20 MED ORDER — METOPROLOL TARTRATE 25 MG PO TABS
25.0000 mg | ORAL_TABLET | Freq: Two times a day (BID) | ORAL | Status: DC
  Administered 2019-10-20 – 2019-10-25 (×8): 25 mg via ORAL
  Filled 2019-10-20 (×9): qty 1

## 2019-10-20 MED ORDER — ATORVASTATIN CALCIUM 80 MG PO TABS
80.0000 mg | ORAL_TABLET | Freq: Every day | ORAL | Status: DC
  Administered 2019-10-20 – 2019-10-25 (×6): 80 mg via ORAL
  Filled 2019-10-20 (×7): qty 1

## 2019-10-20 MED ORDER — ENOXAPARIN SODIUM 30 MG/0.3ML ~~LOC~~ SOLN: 30.0000 mg | SUBCUTANEOUS | Status: DC

## 2019-10-20 MED ORDER — METHYLPREDNISOLONE SODIUM SUCC 40 MG IJ SOLR
40.0000 mg | Freq: Four times a day (QID) | INTRAMUSCULAR | Status: AC
  Administered 2019-10-20 – 2019-10-21 (×4): 40 mg via INTRAVENOUS
  Filled 2019-10-20 (×4): qty 1

## 2019-10-20 MED ORDER — ONDANSETRON HCL 4 MG PO TABS: 4.0000 mg | ORAL_TABLET | Freq: Four times a day (QID) | ORAL | Status: DC | PRN

## 2019-10-20 MED ORDER — ALPRAZOLAM 0.5 MG PO TABS
0.5000 mg | ORAL_TABLET | Freq: Three times a day (TID) | ORAL | Status: DC | PRN
  Administered 2019-10-22: 0.5 mg via ORAL
  Filled 2019-10-20: qty 1

## 2019-10-20 MED ORDER — ALBUTEROL (5 MG/ML) CONTINUOUS INHALATION SOLN
10.0000 mg/h | INHALATION_SOLUTION | RESPIRATORY_TRACT | Status: DC
  Administered 2019-10-20: 10 mg/h via RESPIRATORY_TRACT
  Filled 2019-10-20: qty 20

## 2019-10-20 MED ORDER — AMOXICILLIN-POT CLAVULANATE 875-125 MG PO TABS: 1.0000 | ORAL_TABLET | Freq: Once | ORAL | Status: DC

## 2019-10-20 MED ORDER — ACETAMINOPHEN 650 MG RE SUPP: 650.0000 mg | Freq: Four times a day (QID) | RECTAL | Status: DC | PRN

## 2019-10-20 MED ORDER — IPRATROPIUM BROMIDE 0.02 % IN SOLN: 0.5000 mg | Freq: Four times a day (QID) | RESPIRATORY_TRACT | Status: DC

## 2019-10-20 MED ORDER — METHYLPREDNISOLONE SODIUM SUCC 125 MG IJ SOLR
125.0000 mg | Freq: Once | INTRAMUSCULAR | Status: AC
  Administered 2019-10-20: 125 mg via INTRAVENOUS
  Filled 2019-10-20: qty 2

## 2019-10-20 MED ORDER — GUAIFENESIN-DM 100-10 MG/5ML PO SYRP
10.0000 mL | ORAL_SOLUTION | Freq: Four times a day (QID) | ORAL | Status: DC
  Administered 2019-10-20 – 2019-10-25 (×18): 10 mL via ORAL
  Filled 2019-10-20 (×18): qty 10

## 2019-10-20 MED ORDER — GUAIFENESIN ER 600 MG PO TB12: 600.0000 mg | ORAL_TABLET | Freq: Two times a day (BID) | ORAL | Status: DC

## 2019-10-20 MED ORDER — SODIUM CHLORIDE 0.9 % IV SOLN
2.0000 g | Freq: Two times a day (BID) | INTRAVENOUS | Status: DC
  Administered 2019-10-20 – 2019-10-25 (×9): 2 g via INTRAVENOUS
  Filled 2019-10-20 (×10): qty 2

## 2019-10-20 MED ORDER — ALBUTEROL SULFATE (2.5 MG/3ML) 0.083% IN NEBU: 2.5000 mg | INHALATION_SOLUTION | RESPIRATORY_TRACT | Status: DC | PRN

## 2019-10-20 MED ORDER — VANCOMYCIN HCL IN DEXTROSE 1-5 GM/200ML-% IV SOLN
1000.0000 mg | Freq: Once | INTRAVENOUS | Status: AC
  Administered 2019-10-20: 1000 mg via INTRAVENOUS
  Filled 2019-10-20: qty 200

## 2019-10-20 MED ORDER — ONDANSETRON HCL 4 MG/2ML IJ SOLN: 4.0000 mg | Freq: Four times a day (QID) | INTRAMUSCULAR | Status: DC | PRN

## 2019-10-20 MED ORDER — PREDNISONE 20 MG PO TABS
40.0000 mg | ORAL_TABLET | Freq: Every day | ORAL | Status: AC
  Administered 2019-10-21 – 2019-10-24 (×4): 40 mg via ORAL
  Filled 2019-10-20 (×4): qty 2

## 2019-10-20 NOTE — ED Notes (Signed)
Maryln Gottron sister [phone] would like an update on the pt

## 2019-10-20 NOTE — Progress Notes (Signed)
Pharmacy Antibiotic Note  Katelyn [name] is a 69 y.o. female admitted on 10/20/2019 with pneumonia.  Pharmacy has been consulted for Vancomycin dosing. WBC elevated. Scr 1.12.   Plan: Vancomycin 1000 mg IV q24h Cefepime per MD Trend WBC, temp, renal function  F/U infectious work-up Drug levels as indicated   Height: 4\' 11"  (149.9 cm) Weight: 61.3 kg (135 lb 2.3 oz) IBW/kg (Calculated) : 43.2  Temp (24hrs), Avg:98.6 F (37 C), Min:98.6 F (37 C), Max:98.6 F (37 C)  Recent Labs  Lab 10/14/19 0505 10/15/19 0602 10/17/19 0547 10/20/19 0356  WBC  --  9.5 10.3 13.7*  CREATININE 1.23* 1.13* 1.03* 1.12*    Estimated Creatinine Clearance: 37.2 mL/min (A) (by C-G formula based on SCr of 1.12 mg/dL (H)).    No Known Allergies  Narda Bonds, PharmD, BCPS Clinical Pharmacist Phone: [phone]

## 2019-10-20 NOTE — Progress Notes (Addendum)
Patient arrived to room 2w20 from ED.  Assessment complete, VS obtained, and Admission database began.

## 2019-10-20 NOTE — ED Provider Notes (Signed)
[facility]ER[facility] DEPART[facility] Provider Note  CSN: [id number] Arrival date & time: 10/20/19 1937  Chief Complaint(s) Shortness of Breath  HPI Katelyn [name] is a 69 y.o. female with extended past medical history listed below including throat cancer status post trach, known DVT/PEs on Eliquis, recent admission to the hospital for hypercarbic respiratory distress thought to be related to COPD, been being treated for pneumonia currently on Augmentin who presents 3 days after being discharged for gradually worsening shortness of breath.  Patient is nonverbal due to her trach.  Family at bedside providing history.  Daughter reports that the patient was discharged with humidified air.  She also had home health come by yesterday who felt patient's secretions were getting worse and or her breathing was worsening as well.  Due to her increased work of breathing, EMS was called out earlier in the evening but patient refused to be transported.  They were again called out this morning for worsening symptoms.  When EMS arrived patient was satting in the 80s on room air.  She was placed on nonrebreather.   HPI  Past Medical History Past Medical History:  Diagnosis Date  . Acute gallstone pancreatitis 01/08/2014  . AKI (acute kidney injury) ([place]) 01/08/2014  . Anemia   . Blood transfusion    11'15 last admission  . Cancer ([place])   . Cholecystitis   . H/O tracheostomy   . Hip fracture ([place])   . Hypercholesteremia   . Hypertension   . Right leg DVT ([place]) 05/08/2014  . Septic shock - Kelbsiella 01/09/2014  . Shortness of breath   . Stroke [facility])    '13- right side weakness, ambulates with cane   Patient Active Problem List   Diagnosis Date Noted  . Goals of care, counseling/discussion   . Palliative care by specialist   . History of noncompliance with medical treatment   . Acute respiratory failure with hypoxia ([place]) 10/06/2019  . COPD exacerbation ([place]) 10/06/2019  .  Generalized weakness 07/12/2017  . Palliative care encounter 07/12/2017  . 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 Pressure injury of skin 05/30/2017  . Malnutrition of moderate degree 05/30/2017  . Hip fx, right, closed, initial encounter ([place]) 05/29/2017  . Closed displaced intertrochanteric fracture of right femur ([place]) 05/29/2017  . Dysphagia 05/29/2017  . Weight loss 05/29/2017  . Difficult airway for intubation 05/29/2017  . Lesion of epiglottis 05/26/2014  . Abnormal CT scan, pancreas or bile duct   . Right leg DVT ([place]) 05/08/2014  . Anemia 01/09/2014  . Hip pain, right 05/06/2011  . 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 CKD (chronic kidney disease) stage 3, GFR 30-59 ml/min (HCC) 03/04/2011  . Diastolic dysfunction 90/24/0973  . Tobacco abuse 03/04/2011  . Constipation 03/04/2011  . Malignant hypertensive urgency 03/01/2011  . Cerebral infarction ([place]) 03/01/2011  . NEUROPATHY, IDIOPATHIC 10/25/2007  . Hypercholesteremia 12/15/2006  . Essential hypertension 12/15/2006   Home Medication(s) Prior to Admission medications   Medication Sig Start Date End Date Taking? Authorizing Provider  acetaminophen (TYLENOL) 160 MG/5ML liquid Take 15.6 mLs (500 mg total) by mouth every 6 (six) hours as needed for pain. Patient not taking: Reported on 10/06/2019 11/28/17   Zigmund Gottron, NP  albuterol (PROVENTIL HFA;VENTOLIN HFA) 108 (90 Base) MCG/ACT inhaler Inhale 2 puffs into the lungs every 4 (four) hours as needed for wheezing or shortness of breath. Patient not taking: Reported on 10/06/2019 04/12/18   Noemi Chapel, MD  albuterol (PROVENTIL) (2.5 MG/3ML)  0.083% nebulizer solution Take 3 mLs (2.5 mg total) by nebulization every 6 (six) hours as needed for wheezing or shortness of breath. Patient not taking: Reported on 10/06/2019 04/12/18   Noemi Chapel, MD  ALPRAZolam Duanne Moron) 0.5 MG tablet Take 1 tablet (0.5 mg total) by mouth 3 (three) times daily as needed for anxiety.  10/18/19   Swayze, Ava, DO  amoxicillin-clavulanate (AUGMENTIN) 875-125 MG tablet Take 1 tablet by mouth every 12 (twelve) hours. 10/18/19   Swayze, Ava, DO  apixaban (ELIQUIS) 5 MG TABS tablet Take 1 tablet (5 mg total) by mouth 2 (two) times daily. 10/18/19   Swayze, Ava, DO  atorvastatin (LIPITOR) 80 MG tablet Take 1 tablet (80 mg total) by mouth daily. 10/18/19   Swayze, Ava, DO  guaiFENesin-dextromethorphan (ROBITUSSIN DM) 100-10 MG/5ML syrup Take 10 mLs by mouth every 6 (six) hours. 10/18/19   Swayze, Ava, DO  ipratropium-albuterol (DUONEB) 0.5-2.5 (3) MG/3ML SOLN Take 3 mLs by nebulization 2 (two) times daily. 10/18/19   Swayze, Ava, DO  metoprolol tartrate (LOPRESSOR) 25 MG tablet Take 1 tablet (25 mg total) by mouth 2 (two) times daily. 10/18/19   Swayze, Ava, DO  nystatin cream (MYCOSTATIN) Apply to affected area 2 times daily Patient not taking: Reported on 10/06/2019 04/12/18   Noemi Chapel, MD  polyethylene glycol (MIRALAX / GLYCOLAX) 17 g packet Take 17 g by mouth daily. 10/18/19   Swayze, Ava, DO  sodium chloride HYPERTONIC 3 % nebulizer solution Take 4 mLs by nebulization 2 (two) times daily. 10/18/19   Swayze, Ava, DO                                                                                                                                    Past Surgical History Past Surgical History:  Procedure Laterality Date  . ABDOMINAL HYSTERECTOMY    . DIRECT LARYNGOSCOPY N/A 06/08/2017   Procedure: DIRECT LARYNGOSCOPY WITH BIOPSY;  Surgeon: Helayne Seminole, MD;  Location: [facility] OR;  Service: ENT;  Laterality: N/A;  . ENDOSCOPIC RETROGRADE CHOLANGIOPANCREATOGRAPHY (ERCP) WITH PROPOFOL N/A 01/11/2014   Procedure: ENDOSCOPIC RETROGRADE CHOLANGIOPANCREATOGRAPHY (ERCP) WITH PROPOFOL;  Surgeon: Gatha Mayer, MD;  Location: [place];  Service: Endoscopy;  Laterality: N/A;  . ERCP    . ERCP N/A 05/26/2014   Procedure: ENDOSCOPIC RETROGRADE CHOLANGIOPANCREATOGRAPHY (ERCP);  Surgeon: Gatha Mayer, MD;  Location: [facility];  Service: Endoscopy;  Laterality: N/A;  . ESOPHAGOGASTRODUODENOSCOPY (EGD) WITH PROPOFOL N/A 12/07/2014   Procedure: ESOPHAGOGASTRODUODENOSCOPY (EGD) WITH PROPOFOL;  Surgeon: Milus Banister, MD;  Location: [facility] ENDOSCOPY;  Service: Endoscopy;  Laterality: N/A;  . EUS N/A 12/07/2014   Procedure: UPPER ENDOSCOPIC ULTRASOUND (EUS) LINEAR;  Surgeon: Milus Banister, MD;  Location: [facility] ENDOSCOPY;  Service: Endoscopy;  Laterality: N/A;  . GASTROINTESTINAL STENT REMOVAL N/A 12/07/2014   Procedure: GASTROINTESTINAL STENT REMOVAL;  Surgeon: Milus Banister, MD;  Location: [facility] ENDOSCOPY;  Service: Endoscopy;  Laterality:  N/A;  pancreatic stent removal  . HIP FRACTURE SURGERY    . INTRAMEDULLARY (IM) NAIL INTERTROCHANTERIC Right 05/29/2017   Procedure: INTRAMEDULLARY (IM) NAIL INTERTROCHANTRIC FEMUR;  Surgeon: Nicholes Stairs, MD;  Location: [place];  Service: Orthopedics;  Laterality: Right;  . PERCUTANEOUS TRACHEOSTOMY N/A 05/29/2017   Procedure: PERCUTANEOUS TRACHEOSTOMY;  Surgeon: Judeth Horn, MD;  Location: [place];  Service: General;  Laterality: N/A;  . TEE WITHOUT CARDIOVERSION  03/06/2011   Procedure: TRANSESOPHAGEAL ECHOCARDIOGRAM (TEE);  Surgeon: Jolaine Artist, MD;  Location: [facility] ENDOSCOPY;  Service: Cardiovascular;  Laterality: N/A;   Family History Family History  Problem Relation Age of Onset  . Breast cancer Maternal Aunt   . Diabetes Mellitus I Father   . Diabetes Mellitus I Sister   . Anesthesia problems Neg Hx   . Hypotension Neg Hx   . Malignant hyperthermia Neg Hx   . Pseudochol deficiency Neg Hx   . Colon cancer Neg Hx   . Esophageal cancer Neg Hx   . Pancreatic cancer Neg Hx   . Kidney disease Neg Hx   . Liver disease Neg Hx     Social History Social History   Tobacco Use  . Smoking status: Former Smoker    Packs/day: 0.25    Types: Cigarettes    Quit date: 05/29/2017    Years since quitting: 2.3  . Smokeless tobacco: Never Used    Substance Use Topics  . Alcohol use: No    Alcohol/week: 0.0 standard drinks  . Drug use: No   Allergies Patient has no known allergies.  Review of Systems Review of Systems All other systems are reviewed and are negative for acute change except as noted in the HPI  Physical Exam Vital Signs  I have reviewed the triage vital signs BP 106/83   Pulse (!) 139   Resp 16   Ht 4\' 11"  (1.499 m)   Wt 61.3 kg   SpO2 82% ra  BMI 27.30 kg/m   Physical Exam Vitals reviewed.  Constitutional:      General: She is not in acute distress.    Appearance: She is well-developed. She is not diaphoretic.  HENT:     Head: Normocephalic and atraumatic.     Nose: Nose normal.  Eyes:     General: No scleral icterus.       Right eye: No discharge.        Left eye: No discharge.     Conjunctiva/sclera: Conjunctivae normal.     Pupils: Pupils are equal, round, and reactive to light.  Neck:   Cardiovascular:     Rate and Rhythm: Normal rate and regular rhythm.     Heart sounds: No murmur heard.  No friction rub. No gallop.   Pulmonary:     Effort: Pulmonary effort is normal. No respiratory distress.     Breath sounds: Normal breath sounds. Decreased air movement present. No stridor. No rales.  Abdominal:     General: There is no distension.     Palpations: Abdomen is soft.     Tenderness: There is no abdominal tenderness.  Musculoskeletal:        General: No tenderness.     Cervical back: Normal range of motion and neck supple.     Right lower leg: No edema.     Left lower leg: No edema.  Skin:    General: Skin is warm and dry.     Findings: No erythema or rash.  Neurological:  Mental Status: She is alert and oriented to person, place, and time.     ED Results and Treatments Labs (all labs ordered are listed, but only abnormal results are displayed) Labs Reviewed  CBC WITH DIFFERENTIAL/PLATELET - Abnormal; Notable for the following components:      Result Value   WBC 13.7  (*)    MCHC 29.4 (*)    Neutro Abs 11.6 (*)    Abs Immature Granulocytes 0.12 (*)    All other components within normal limits  COMPREHENSIVE METABOLIC PANEL - Abnormal; Notable for the following components:   Glucose, Bld 120 (*)    Creatinine, Ser 1.12 (*)    Albumin 3.2 (*)    GFR calc non Af Amer 50 (*)    GFR calc Af Amer 58 (*)    All other components within normal limits  SARS CORONAVIRUS 2 BY RT PCR [facility] ORDER, [place] [facility])                                                                                                                         EKG  EKG Interpretation  Date/Time:  Thursday October 20 2019 03:43:26 EDT Ventricular Rate:  105 PR Interval:    QRS Duration: 77 QT Interval:  340 QTC Calculation: 450 R Axis:   27 Text Interpretation: Sinus tachycardia Borderline repolarization abnormality No acute changes Confirmed by Addison Lank [phone]) on 10/20/2019 4:39:28 AM      Radiology DG Chest Port 1 View  Result Date: 10/20/2019 CLINICAL DATA:  Shortness of breath EXAM: PORTABLE CHEST 1 VIEW COMPARISON:  Three days ago FINDINGS: Streaky density at the lung bases. No Kerley lines, effusion, or pneumothorax. Prominent left ventricular contour but overall normal heart size for portable technique. Aortic tortuosity. Postoperative thoracic inlet. IMPRESSION: Atelectasis or infiltrate at the right base. Electronically Signed   By: Monte Fantasia M.D.   On: 10/20/2019 04:18    Pertinent labs & imaging results that were available during my care of the patient were reviewed by me and considered in my medical decision making (see chart for details).  Medications Ordered in ED Medications  albuterol (PROVENTIL,VENTOLIN) solution continuous neb (10 mg/hr Nebulization New Bag/Given 10/20/19 0356)  ipratropium (ATROVENT) nebulizer solution 0.5 mg (0.5 mg Nebulization Given 10/20/19 0356)  methylPREDNISolone sodium succinate (SOLU-MEDROL) 125 mg/2 mL  injection 125 mg (125 mg Intravenous Given 10/20/19 0406)  Procedures .1-3 Lead EKG Interpretation Performed by: Fatima Blank, MD Authorized by: Fatima Blank, MD     Interpretation: abnormal     ECG rate:  125   ECG rate assessment: tachycardic     Rhythm: sinus tachycardia     Ectopy: none     Conduction: normal   .Critical Care Performed by: Fatima Blank, MD Authorized by: Fatima Blank, MD     CRITICAL CARE Performed by: Grayce Sessions Zelma Snead Total critical care time: 50 minutes Critical care time was exclusive of separately billable procedures and treating other patients. Critical care was necessary to treat or prevent imminent or life-threatening deterioration. Critical care was time spent personally by me on the following activities: development of treatment plan with patient and/or surrogate as well as nursing, discussions with consultants, evaluation of patient's response to treatment, examination of patient, obtaining history from patient or surrogate, ordering and performing treatments and interventions, ordering and review of laboratory studies, ordering and review of radiographic studies, pulse oximetry and re-evaluation of patient's condition.  (including critical care time)  Medical Decision Making / ED Course I have reviewed the nursing notes for this encounter and the patient's prior records (if available in EHR or on provided paperwork).   Sonny Poth was evaluated in Emergency Department on 10/20/2019 for the symptoms described in the history of present illness. She was evaluated in the context of the global COVID-19 pandemic, which necessitated consideration that the patient might be at risk for infection with the SARS-CoV-2 virus that causes COVID-19. Institutional protocols and algorithms that  pertain to the evaluation of patients at risk for COVID-19 are in a state of rapid change based on information released by regulatory bodies including the CDC and federal and state organizations. These policies and algorithms were followed during the patient's care in the ED.  Patient presents with respiratory distress. Diminished lung sounds bilaterally.  Possible mucous plug versus COPD exacerbation. Patient treated with continuous albuterol nebulizer and Atrovent.  She was given Solu-Medrol.  Chest x-ray revealed new right lower lobe opacity that was not seen on prior to discharge 3 days ago.  No evidence of pneumothorax.  CBC with leukocytosis.  No anemia.  Metabolic panel close to her baseline.  After breathing treatment, patient had improved air movement.  She did cough up a little large mucous plug and was also noted to have bloody sputum from her trach.  This required suctioning, which retrieved additional mucous plugs.  Due to her new opacity, patient was started on empiric antibiotics.  Even after treatment and suctioning, patient required supplemental oxygen and was placed initially on 5 L of humidified air which had to be increased to 8 L after patient sats dropped to the 80s again.  Patient was admitted to hospitalist service for further work-up and management.      Final Clinical Impression(s) / ED Diagnoses Final diagnoses:  SOB (shortness of breath)  Acute hypoxemic respiratory failure (HCC)  Pneumonia of right lower lobe due to infectious organism  Multiple tracheobronchial mucus plugs      This chart was dictated using voice recognition software.  Despite best efforts to proofread,  errors can occur which can change the documentation meaning.   Fatima Blank, MD 10/20/19 [phone]

## 2019-10-20 NOTE — ED Notes (Signed)
Dinner ordered 

## 2019-10-20 NOTE — H&P (Signed)
History and Physical    Katelyn [name] INO:[id number] DOB: 1950/08/19 DOA: 10/20/2019  PCP: Aretta Nip, MD   Patient coming from: Home.   I have personally briefly reviewed patient's old medical records in [place]  Chief Complaint: Shortness of breath.  HPI: Katelyn [name] is a 69 y.o. female with medical history significant of gallstone pancreatitis, history of AKI, normocytic anemia, history of blood transfusion, history of throat cancer, tracheostomy, right hip fracture, hyperlipidemia, hypertension, right lower extremity DVT, history of Klebsiella septic shock, history of other nonhemorrhagic CVA with right-sided weakness who was discharged 2 days ago after a 12-day hospitalization due to respiratory failure secondary to COPD exacerbation, bilateral pneumonia and aspiration pneumonia.  She returns to the emergency department after complaining of exacerbation of dyspnea twice over the night requiring each time to be seen by EMS.  She declined to come to the hospital the first time, but subsequently her symptoms worsen again and EMS convinced the second time to return to the hospital.  EMS described that the patient was satting 90% on room air, but did not improve with supplemental oxygen.  The patient was vigorously suctioned in the emergency department which for the use significant relief of her symptoms.  She denies fever, chills, night sweats, but states her appetite and sleep are both decreased.  She denies abdominal pain, nausea, emesis, diarrhea, constipation, melena or hematochezia.  No dysuria, frequency or hematuria.  No polyuria, polydipsia, polyphagia or blurred vision.  ED Course: Initial vital signs were temperature 98.6 F, pulse 109, respirations 21, blood pressure 120/76 mmHg O2 sat 100% on 10 L on NRB, and most recently 93% on 5 L/min via tracheostomy collar.  She received Valium 2.5 mg IVP x1 prior to suctioning, albuterol 10 mg plus ipratropium 0.5 mg via  continuous neb treatment.  She was also started on cefepime and vancomycin.  CBC shows a white count of 13.7 with 84% neutrophils, hemoglobin 12.0 g/dL platelets 286.  CMP shows a glucose of 120 and creatinine of 1.12 mg/dL.  Her albumin was 3.2 g/dL.  All other CMP values are within normal range.  Coronavirus 2 PCR was negative.  Her 1 view chest radiograph shows atelectasis or new infiltrate on RLL.  Review of Systems: As per HPI otherwise all other systems reviewed and are negative.  Past Medical History:  Diagnosis Date  . Acute gallstone pancreatitis 01/08/2014  . AKI (acute kidney injury) ([place]) 01/08/2014  . Anemia   . Blood transfusion    11'15 last admission  . Cancer ([place])   . Cholecystitis   . H/O tracheostomy   . Hip fracture ([place])   . Hypercholesteremia   . Hypertension   . Right leg DVT ([place]) 05/08/2014  . Septic shock - Kelbsiella 01/09/2014  . Shortness of breath   . Stroke [facility])    '13- right side weakness, ambulates with cane    Past Surgical History:  Procedure Laterality Date  . ABDOMINAL HYSTERECTOMY    . DIRECT LARYNGOSCOPY N/A 06/08/2017   Procedure: DIRECT LARYNGOSCOPY WITH BIOPSY;  Surgeon: Helayne Seminole, MD;  Location: [facility] OR;  Service: ENT;  Laterality: N/A;  . ENDOSCOPIC RETROGRADE CHOLANGIOPANCREATOGRAPHY (ERCP) WITH PROPOFOL N/A 01/11/2014   Procedure: ENDOSCOPIC RETROGRADE CHOLANGIOPANCREATOGRAPHY (ERCP) WITH PROPOFOL;  Surgeon: Gatha Mayer, MD;  Location: [place];  Service: Endoscopy;  Laterality: N/A;  . ERCP    . ERCP N/A 05/26/2014   Procedure: ENDOSCOPIC RETROGRADE CHOLANGIOPANCREATOGRAPHY (ERCP);  Surgeon: Gatha Mayer, MD;  Location: [facility] ENDOSCOPY;  Service: Endoscopy;  Laterality: N/A;  . ESOPHAGOGASTRODUODENOSCOPY (EGD) WITH PROPOFOL N/A 12/07/2014   Procedure: ESOPHAGOGASTRODUODENOSCOPY (EGD) WITH PROPOFOL;  Surgeon: Milus Banister, MD;  Location: [facility] ENDOSCOPY;  Service: Endoscopy;  Laterality: N/A;  . EUS N/A 12/07/2014    Procedure: UPPER ENDOSCOPIC ULTRASOUND (EUS) LINEAR;  Surgeon: Milus Banister, MD;  Location: [facility] ENDOSCOPY;  Service: Endoscopy;  Laterality: N/A;  . GASTROINTESTINAL STENT REMOVAL N/A 12/07/2014   Procedure: GASTROINTESTINAL STENT REMOVAL;  Surgeon: Milus Banister, MD;  Location: [facility] ENDOSCOPY;  Service: Endoscopy;  Laterality: N/A;  pancreatic stent removal  . HIP FRACTURE SURGERY    . INTRAMEDULLARY (IM) NAIL INTERTROCHANTERIC Right 05/29/2017   Procedure: INTRAMEDULLARY (IM) NAIL INTERTROCHANTRIC FEMUR;  Surgeon: Nicholes Stairs, MD;  Location: [place];  Service: Orthopedics;  Laterality: Right;  . PERCUTANEOUS TRACHEOSTOMY N/A 05/29/2017   Procedure: PERCUTANEOUS TRACHEOSTOMY;  Surgeon: Judeth Horn, MD;  Location: [place];  Service: General;  Laterality: N/A;  . TEE WITHOUT CARDIOVERSION  03/06/2011   Procedure: TRANSESOPHAGEAL ECHOCARDIOGRAM (TEE);  Surgeon: Jolaine Artist, MD;  Location[facility] ENDOSCOPY;  Service: Cardiovascular;  Laterality: N/A;    Social History  reports that she quit smoking about 2 years ago. Her smoking use included cigarettes. She smoked 0.25 packs per day. She has never used smokeless tobacco. She reports that she does not drink alcohol and does not use drugs.  No Known Allergies  Family History  Problem Relation Age of Onset  . Breast cancer Maternal Aunt   . Diabetes Mellitus I Father   . Diabetes Mellitus I Sister   . Anesthesia problems Neg Hx   . Hypotension Neg Hx   . Malignant hyperthermia Neg Hx   . Pseudochol deficiency Neg Hx   . Colon cancer Neg Hx   . Esophageal cancer Neg Hx   . Pancreatic cancer Neg Hx   . Kidney disease Neg Hx   . Liver disease Neg Hx    Prior to Admission medications   Medication Sig Start Date End Date Taking? Authorizing Provider  acetaminophen (TYLENOL) 160 MG/5ML liquid Take 15.6 mLs (500 mg total) by mouth every 6 (six) hours as needed for pain. Patient not taking: Reported on 10/06/2019 11/28/17   Zigmund Gottron, NP  albuterol (PROVENTIL HFA;VENTOLIN HFA) 108 (90 Base) MCG/ACT inhaler Inhale 2 puffs into the lungs every 4 (four) hours as needed for wheezing or shortness of breath. Patient not taking: Reported on 10/06/2019 04/12/18   Noemi Chapel, MD  albuterol (PROVENTIL) (2.5 MG/3ML) 0.083% nebulizer solution Take 3 mLs (2.5 mg total) by nebulization every 6 (six) hours as needed for wheezing or shortness of breath. Patient not taking: Reported on 10/06/2019 04/12/18   Noemi Chapel, MD  ALPRAZolam Duanne Moron) 0.5 MG tablet Take 1 tablet (0.5 mg total) by mouth 3 (three) times daily as needed for anxiety. 10/18/19   Swayze, Ava, DO  amoxicillin-clavulanate (AUGMENTIN) 875-125 MG tablet Take 1 tablet by mouth every 12 (twelve) hours. 10/18/19   Swayze, Ava, DO  apixaban (ELIQUIS) 5 MG TABS tablet Take 1 tablet (5 mg total) by mouth 2 (two) times daily. 10/18/19   Swayze, Ava, DO  atorvastatin (LIPITOR) 80 MG tablet Take 1 tablet (80 mg total) by mouth daily. 10/18/19   Swayze, Ava, DO  guaiFENesin-dextromethorphan (ROBITUSSIN DM) 100-10 MG/5ML syrup Take 10 mLs by mouth every 6 (six) hours. 10/18/19   Swayze, Ava, DO  ipratropium-albuterol (DUONEB) 0.5-2.5 (3) MG/3ML SOLN Take 3 mLs by nebulization  2 (two) times daily. 10/18/19   Swayze, Ava, DO  metoprolol tartrate (LOPRESSOR) 25 MG tablet Take 1 tablet (25 mg total) by mouth 2 (two) times daily. 10/18/19   Swayze, Ava, DO  nystatin cream (MYCOSTATIN) Apply to affected area 2 times daily Patient not taking: Reported on 10/06/2019 04/12/18   Noemi Chapel, MD  polyethylene glycol (MIRALAX / GLYCOLAX) 17 g packet Take 17 g by mouth daily. 10/18/19   Swayze, Ava, DO  sodium chloride HYPERTONIC 3 % nebulizer solution Take 4 mLs by nebulization 2 (two) times daily. 10/18/19   Swayze, Ava, DO    Physical Exam: Vitals:   10/20/19 0357 10/20/19 0515 10/20/19 0610 10/20/19 0612  BP:      Pulse:  (!) 139    Resp:  16    Temp:    98.6 F (37 C)  TempSrc:   Temporal Oral    SpO2:  93%    Weight: 61.3 kg   61.3 kg  Height: 4\' 11"  (1.499 m)   4\' 11"  (1.499 m)    Constitutional: Looks chronically ill. Eyes: PERRL, lids and conjunctivae mildly injected. ENMT: Mucous membranes are dry.  Posterior pharynx clear of any exudate or lesions. Neck: Trach collar in place.  Normal, supple, no masses, no thyromegaly Respiratory: Decreased breath sounds on bases with rhonchi bilaterally, no wheezing, no crackles. Normal respiratory effort. No accessory muscle use.  Cardiovascular: Regular rate and rhythm, no murmurs / rubs / gallops. No extremity edema. 2+ pedal pulses. No carotid bruits.  Abdomen: Nondistended. Bowel sounds positive.  Soft, no tenderness, no masses palpated. No hepatosplenomegaly. Musculoskeletal: no clubbing / cyanosis.  Good ROM, no contractures. Normal muscle tone.  Skin: Small areas of ecchymosis on upper extremities. Neurologic: CN 2-12 grossly intact. Sensation intact, DTR normal. Strength 5/5 in all 4.  Psychiatric: Normal judgment and insight. Alert and oriented x 3. Normal mood.   Labs on Admission: I have personally reviewed following labs and imaging studies  CBC: Recent Labs  Lab 10/15/19 0602 10/17/19 0547 10/20/19 0356  WBC 9.5 10.3 13.7*  NEUTROABS  --  7.1 11.6*  HGB 11.7* 11.9* 12.0  HCT 39.2 39.7 40.8  MCV 96.1 96.8 96.0  PLT 272 257 010    Basic Metabolic Panel: Recent Labs  Lab 10/14/19 0505 10/15/19 0602 10/17/19 0547 10/20/19 0356  NA 142 141 142 139  K 4.6 4.2 4.5 4.1  CL 105 105 105 103  CO2 26 25 26 25   GLUCOSE 92 84 80 120*  BUN 43* 38* 30* 17  CREATININE 1.23* 1.13* 1.03* 1.12*  CALCIUM 9.3 8.8* 9.1 9.1    GFR: Estimated Creatinine Clearance: 37.2 mL/min (A) (by C-G formula based on SCr of 1.12 mg/dL (H)).  Liver Function Tests: Recent Labs  Lab 10/20/19 0356  AST 22  ALT 20  ALKPHOS 90  BILITOT 0.7  PROT 7.1  ALBUMIN 3.2*    Radiological Exams on Admission: DG Chest Port 1 View  Result  Date: 10/20/2019 CLINICAL DATA:  Shortness of breath EXAM: PORTABLE CHEST 1 VIEW COMPARISON:  Three days ago FINDINGS: Streaky density at the lung bases. No Kerley lines, effusion, or pneumothorax. Prominent left ventricular contour but overall normal heart size for portable technique. Aortic tortuosity. Postoperative thoracic inlet. IMPRESSION: Atelectasis or infiltrate at the right base. Electronically Signed   By: Monte Fantasia M.D.   On: 10/20/2019 04:18    EKG: Independently reviewed.  Vent. rate 105 BPM PR interval * ms QRS duration 77  ms QT/QTc 340/450 ms P-R-T axes 74 27 54 Sinus tachycardia Borderline repolarization abnormality  Assessment/Plan Principal Problem:   Acute respiratory failure with hypoxia (HCC) Observation/progressive unit. New infiltrates on chest radiograph. Mucous plug versus new consolidation. Continue cefepime and vancomycin for now. Check procalcitonin level. Continue bronchodilators. Mucomyst nebulizer solution 4 times daily. Will need frequent RT intervention for secretions handling.  Active Problems:   COPD exacerbation (HCC) Supplemental oxygen. Continue Solu-Medrol 40 mg IV every 6 hours. Continue bronchodilators. Frequent secretions suctioning.    Hypercholesteremia On atorvastatin 80 mg p.o. daily.    Essential hypertension Continue metoprolol 25 mg p.o. twice daily.    CKD (chronic kidney disease) stage 3, GFR 30-59 ml/min (HCC) Monitor renal function electrolytes.    Diastolic dysfunction No signs of decompensation.    Cancer of hypopharynx [facility]) Continue tracheostomy care. Follow-up with oncology as scheduled.    Generalized weakness Supportive care. Consider PT evaluation.     DVT prophylaxis: Lovenox . Code Status:   Full code. Family Communication:  Her granddaughter was present in the emergency department. Disposition Plan:   Patient is from:  Home.  Anticipated DC to:  Home.  Anticipated DC  date:  10/22/2019.  Anticipated DC barriers: Clinical status.  Consults called: Admission status:  Observation/progressive.  Severity of Illness:  Reubin Milan MD [facility]  How to contact the [facility] Attending or Consulting provider [place] or covering provider during after hours [place], for this patient?   1. Check the care team in [facility] and look for a) attending/consulting TR[facility] provider listed and b) the [facility] team listed 2. Log into [email or web address] and use [place]'s universal password to access. If you do not have the password, please contact the hospital operator. 3. Locate the [facility] provider you are looking for under [facility] and page to a number that you can be directly reached. 4. If you still have difficulty reaching the provider, please page the [facility] (Director on Call) for the Hospitalists listed on amion for assistance.  10/20/2019, 6:19 AM   This document was prepared using Dragon voice recognition software may contain some unintended transcription errors.

## 2019-10-20 NOTE — ED Triage Notes (Signed)
Pt presents to the ED BIB [facility]  Presenting with [facility] that started tonight. Per EMS Pt's O2 was 90 RA and showed no signs of improvement with O2. Trach Pt with Hx of throat cancer, CHF and PE and is on Eliquis. A/OX4  BP 130/72 HR 112 R 26

## 2019-10-20 NOTE — Progress Notes (Signed)
  PROGRESS NOTE  Patient admitted earlier this morning. See H&P.   Katelyn [name] is a 69 y.o. female with medical history significant of laryngeal cancer s/p laryngectomy, tracheostomy, gallstone pancreatitis, history of right hip fracture, hyperlipidemia, hypertension, right lower extremity DVT, history of Klebsiella septic shock, history of other nonhemorrhagic CVA with right-sided weakness who was discharged 10/18/19 after a 12-day hospitalization due to respiratory failure secondary to COPD exacerbation, bilateral pneumonia and aspiration pneumonia.  She returns to the emergency department after complaining of exacerbation of dyspnea twice overnight requiring each time to be seen by EMS.  She declined to come to the hospital the first time, but subsequently her symptoms worsen again and EMS convinced the second time to return to the hospital.  EMS described that the patient was satting 90% on room air, but did not improve with supplemental oxygen.  The patient was vigorously suctioned in the emergency department which for the use significant relief of her symptoms.  Imaging in the emergency department revealed right-sided pneumonia.  Patient was started on vancomycin, cefepime.  She was also started on Solu-Medrol for suspected COPD exacerbation.  Patient seen and examined in the emergency department.  Daughter at bedside.  Patient states that she is feeling a little bit better.  Per daughter, patient has been complaining of a dry cough, worsening shortness of breath after discharge from the hospital.  Patient was not sent home with supplemental oxygen from discharge.  She has been taking p.o. antibiotics at home.  A/P:   Acute hypoxemic respiratory failure secondary to HCAP -MRSA PCR negative.  Stop vancomycin -Procalcitonin negative -Covid negative -Continue empiric cefepime -Continue to suction as needed -Trach care  COPD exacerbation -Continue Solu-Medrol, breathing  treatment  Hyperlipidemia -Continue atorvastatin  Essential hypertension -Continue metoprolol  CKD stage IIIa -Baseline creatinine around 1.1-1.3 -Stable  History of DVT -Eliquis    Dessa Phi, DO [facility] 10/20/2019, 2:00 PM  Available via Epic secure chat 7am-7pm After these hours, please refer to coverage provider listed on [email or web address]

## 2019-10-21 DIAGNOSIS — Z86718 Personal history of other venous thrombosis and embolism: Secondary | ICD-10-CM | POA: Diagnosis not present

## 2019-10-21 DIAGNOSIS — X58XXXA Exposure to other specified factors, initial encounter: Secondary | ICD-10-CM | POA: Diagnosis present

## 2019-10-21 DIAGNOSIS — R0602 Shortness of breath: Secondary | ICD-10-CM | POA: Diagnosis not present

## 2019-10-21 DIAGNOSIS — E785 Hyperlipidemia, unspecified: Secondary | ICD-10-CM | POA: Diagnosis present

## 2019-10-21 DIAGNOSIS — Z86711 Personal history of pulmonary embolism: Secondary | ICD-10-CM | POA: Diagnosis not present

## 2019-10-21 DIAGNOSIS — Y95 Nosocomial condition: Secondary | ICD-10-CM | POA: Diagnosis present

## 2019-10-21 DIAGNOSIS — I69351 Hemiplegia and hemiparesis following cerebral infarction affecting right dominant side: Secondary | ICD-10-CM | POA: Diagnosis not present

## 2019-10-21 DIAGNOSIS — J9601 Acute respiratory failure with hypoxia: Secondary | ICD-10-CM | POA: Diagnosis present

## 2019-10-21 DIAGNOSIS — Z930 Tracheostomy status: Secondary | ICD-10-CM | POA: Diagnosis not present

## 2019-10-21 DIAGNOSIS — Z7901 Long term (current) use of anticoagulants: Secondary | ICD-10-CM | POA: Diagnosis not present

## 2019-10-21 DIAGNOSIS — Z90710 Acquired absence of both cervix and uterus: Secondary | ICD-10-CM | POA: Diagnosis not present

## 2019-10-21 DIAGNOSIS — J441 Chronic obstructive pulmonary disease with (acute) exacerbation: Secondary | ICD-10-CM | POA: Diagnosis present

## 2019-10-21 DIAGNOSIS — J440 Chronic obstructive pulmonary disease with acute lower respiratory infection: Secondary | ICD-10-CM | POA: Diagnosis present

## 2019-10-21 DIAGNOSIS — Z79899 Other long term (current) drug therapy: Secondary | ICD-10-CM | POA: Diagnosis not present

## 2019-10-21 DIAGNOSIS — Z85819 Personal history of malignant neoplasm of unspecified site of lip, oral cavity, and pharynx: Secondary | ICD-10-CM | POA: Diagnosis not present

## 2019-10-21 DIAGNOSIS — D649 Anemia, unspecified: Secondary | ICD-10-CM | POA: Diagnosis not present

## 2019-10-21 DIAGNOSIS — Z87891 Personal history of nicotine dependence: Secondary | ICD-10-CM | POA: Diagnosis not present

## 2019-10-21 DIAGNOSIS — N1831 Chronic kidney disease, stage 3a: Secondary | ICD-10-CM | POA: Diagnosis present

## 2019-10-21 DIAGNOSIS — J189 Pneumonia, unspecified organism: Secondary | ICD-10-CM | POA: Diagnosis present

## 2019-10-21 DIAGNOSIS — J449 Chronic obstructive pulmonary disease, unspecified: Secondary | ICD-10-CM | POA: Diagnosis not present

## 2019-10-21 DIAGNOSIS — I10 Essential (primary) hypertension: Secondary | ICD-10-CM | POA: Diagnosis not present

## 2019-10-21 DIAGNOSIS — R159 Full incontinence of feces: Secondary | ICD-10-CM | POA: Diagnosis not present

## 2019-10-21 DIAGNOSIS — I129 Hypertensive chronic kidney disease with stage 1 through stage 4 chronic kidney disease, or unspecified chronic kidney disease: Secondary | ICD-10-CM | POA: Diagnosis present

## 2019-10-21 DIAGNOSIS — Z7951 Long term (current) use of inhaled steroids: Secondary | ICD-10-CM | POA: Diagnosis not present

## 2019-10-21 DIAGNOSIS — Z20822 Contact with and (suspected) exposure to covid-19: Secondary | ICD-10-CM | POA: Diagnosis present

## 2019-10-21 DIAGNOSIS — E7800 Pure hypercholesterolemia, unspecified: Secondary | ICD-10-CM | POA: Diagnosis present

## 2019-10-21 DIAGNOSIS — R5381 Other malaise: Secondary | ICD-10-CM | POA: Diagnosis not present

## 2019-10-21 DIAGNOSIS — G479 Sleep disorder, unspecified: Secondary | ICD-10-CM | POA: Diagnosis not present

## 2019-10-21 DIAGNOSIS — T17590A Other foreign object in bronchus causing asphyxiation, initial encounter: Secondary | ICD-10-CM | POA: Diagnosis present

## 2019-10-21 DIAGNOSIS — R0989 Other specified symptoms and signs involving the circulatory and respiratory systems: Secondary | ICD-10-CM | POA: Diagnosis not present

## 2019-10-21 DIAGNOSIS — C139 Malignant neoplasm of hypopharynx, unspecified: Secondary | ICD-10-CM | POA: Diagnosis present

## 2019-10-21 LAB — CBC
HCT: 31.9 % — ABNORMAL LOW (ref 36.0–46.0)
Hemoglobin: 9.6 g/dL — ABNORMAL LOW (ref 12.0–15.0)
MCH: 28.1 pg (ref 26.0–34.0)
MCHC: 30.1 g/dL (ref 30.0–36.0)
MCV: 93.3 fL (ref 80.0–100.0)
Platelets: 229 10*3/uL (ref 150–400)
RBC: 3.42 MIL/uL — ABNORMAL LOW (ref 3.87–5.11)
RDW: 12.5 % (ref 11.5–15.5)
WBC: 13.3 10*3/uL — ABNORMAL HIGH (ref 4.0–10.5)
nRBC: 0 % (ref 0.0–0.2)

## 2019-10-21 LAB — BASIC METABOLIC PANEL
Anion gap: 12 (ref 5–15)
BUN: 29 mg/dL — ABNORMAL HIGH (ref 8–23)
CO2: 21 mmol/L — ABNORMAL LOW (ref 22–32)
Calcium: 8.9 mg/dL (ref 8.9–10.3)
Chloride: 107 mmol/L (ref 98–111)
Creatinine, Ser: 1.34 mg/dL — ABNORMAL HIGH (ref 0.44–1.00)
GFR calc Af Amer: 46 mL/min — ABNORMAL LOW (ref >60)
GFR calc non Af Amer: 40 mL/min — ABNORMAL LOW (ref >60)
Glucose, Bld: 153 mg/dL — ABNORMAL HIGH (ref 70–99)
Potassium: 4.7 mmol/L (ref 3.5–5.1)
Sodium: 140 mmol/L (ref 135–145)

## 2019-10-21 LAB — HIV ANTIBODY (ROUTINE TESTING W REFLEX): HIV Screen 4th Generation wRfx: NONREACTIVE

## 2019-10-21 LAB — GLUCOSE, CAPILLARY
Glucose-Capillary: 106 mg/dL — ABNORMAL HIGH (ref 70–99)
Glucose-Capillary: 217 mg/dL — ABNORMAL HIGH (ref 70–99)

## 2019-10-21 MED ORDER — ENSURE ENLIVE PO LIQD
237.0000 mL | Freq: Two times a day (BID) | ORAL | Status: DC
  Administered 2019-10-21 – 2019-10-25 (×4): 237 mL via ORAL

## 2019-10-21 NOTE — Evaluation (Signed)
Physical Therapy Evaluation Patient Details Name: Katelyn [name] MRN: [id number] DOB: 1950-03-29 Today's Date: 10/21/2019   History of Present Illness  Katelyn [name] is a 69 y.o. female with medical history significant of laryngeal cancer s/p laryngectomy, tracheostomy, gallstone pancreatitis, history of right hip fracture, hyperlipidemia, hypertension, right lower extremity DVT, history of Klebsiella septic shock, history of other nonhemorrhagic CVA with right-sided weakness who was discharged 10/18/19 after a 12-day hospitalization due to respiratory failure secondary to COPD exacerbation, bilateral pneumonia and aspiration pneumonia.  She returns to the emergency department after complaining of exacerbation of dyspnea twice overnight requiring each time to be seen by EMS.     Clinical Impression  Patient was sitting up in recliner chair at start of session getting medications from RN on 5L O2 on trach collar. She was able to mouth words secondary to the trach collar and had appropriate responses to all questions and conversations. Son was present and able to provide home set-up and PLOF. Took patient off of O2 to observe oxygen saturation with mobility. UE and LE MMT showed significant global weakness, with R < L. Increased difficulty with RLE MMT due to past R hip fx. O2 remained WFL. Transferred to stand and ambulated approximately 8' with RW. Needed to sit in small chair due to fatigue and be moved towards recliner. +2 for equipment. Stand-pivot transfer from small chair to recliner w/ RW. See below for assistance levels. She was left with all needs within reach in recliner chair with family present.   *She would continue to benefit from skilled physical therapy as she stated she stated she is not at her baseline prior to hospital admission. Patient demonstrated deficits in strength, balance, mobility, DME instruction, and gait. Recommend CIR for f/u physical therapy services.    Follow Up  Recommendations CIR    Equipment Recommendations  3in1 (PT);Other (comment);Rolling walker with 5" wheels (or defer to next venue)    Recommendations for Other Services       Precautions / Restrictions Precautions Precautions: Fall Precaution Comments: trach Restrictions Weight Bearing Restrictions: No      Mobility  Bed Mobility               General bed mobility comments: sitting in chair on OT arrival  Transfers Overall transfer level: Needs assistance Equipment used: Rolling walker (2 wheeled) Transfers: Sit to/from [facility] Sit to Stand: Min assist Stand pivot transfers: Min assist       General transfer comment: Min A for power up and stabilizing with RW, assistance for RW manuevering and turning  Ambulation/Gait Ambulation/Gait assistance: Mod assist;+2 safety/equipment Gait Distance (Feet): 8 Feet Assistive device: Rolling walker (2 wheeled) Gait Pattern/deviations: Step-to pattern;Trunk flexed Gait velocity: decreased   General Gait Details: pt required modA to steer RW and cueing. +2 for equipment. needed to stop and sit in chair due to weakness/fatigue  Stairs            Wheelchair Mobility    Modified Rankin (Stroke Patients Only)       Balance Overall balance assessment: Needs assistance Sitting-balance support: Feet unsupported;No upper extremity supported   Sitting balance - Comments: sitting in recliner on arrival   Standing balance support: Bilateral upper extremity supported Standing balance-Leahy Scale: Poor Standing balance comment: reliant on RW for support, unsteady steps when walking  Pertinent Vitals/Pain      Home Living Family/patient expects to be discharged to:: Private residence Living Arrangements: Spouse/significant other Available Help at Discharge: Family;Available PRN/intermittently Type of Home: House Home Access: Ramped entrance     Home  Layout: One level Home Equipment: Clinical cytogeneticist - 4 wheels;Cane - single point;Toilet riser;Bedside commode      Prior Function Level of Independence: Independent with assistive device(s)         Comments: ambulatory with rollator around home. Reports able to complete ADLs without assist     Hand Dominance   Dominant Hand: Right    Extremity/Trunk Assessment   Upper Extremity Assessment Upper Extremity Assessment: Defer to OT evaluation    Lower Extremity Assessment Lower Extremity Assessment: Generalized weakness;RLE deficits/detail RLE Deficits / Details: RLE weakness secondary to breaking R femur in 3 places 1-2 years ago    Cervical / Trunk Assessment Cervical / Trunk Assessment: Kyphotic  Communication   Communication: Tracheostomy  Cognition Arousal/Alertness: Awake/alert Behavior During Therapy: WFL for tasks assessed/performed Overall Cognitive Status: Within Functional Limits for tasks assessed                                 General Comments: non verbal but appropriate responses and answers by mouthing words. Appears WFL      General Comments General comments (skin integrity, edema, etc.): Pt on room air, SpO2 and HR WFL    Exercises     Assessment/Plan    PT Assessment Patient needs continued PT services  PT Problem List Decreased strength;Decreased mobility;Decreased activity tolerance;Decreased balance       PT Treatment Interventions DME instruction;Gait training;Therapeutic activities;Therapeutic exercise;Functional mobility training;Balance training;Patient/family education    PT Goals (Current goals can be found in the Care Plan section)  Acute Rehab PT Goals Patient Stated Goal: go home, be able to breathe better PT Goal Formulation: With patient/family Time For Goal Achievement: 11/04/19 Potential to Achieve Goals: Good    Frequency Min 4X/week   Barriers to discharge        Co-evaluation                AM-PAC PT "6 Clicks" Mobility  Outcome Measure Help needed turning from your back to your side while in a flat bed without using bedrails?: A Little Help needed moving from lying on your back to sitting on the side of a flat bed without using bedrails?: A Little Help needed moving to and from a bed to a chair (including a wheelchair)?: A Lot Help needed standing up from a chair using your arms (e.g., wheelchair or bedside chair)?: A Little Help needed to walk in hospital room?: A Lot Help needed climbing 3-5 steps with a railing? : Total 6 Click Score: 14    End of Session Equipment Utilized During Treatment: Gait belt Activity Tolerance: Patient tolerated treatment well Patient left: in chair;with call bell/phone within reach Nurse Communication: Mobility status PT Visit Diagnosis: Muscle weakness (generalized) (M62.81);Unsteadiness on feet (R26.81)    Time:  -      Charges:            Livingston Diones, SPT, ATC

## 2019-10-21 NOTE — TOC Initial Note (Signed)
Transition of Care [facility]) - Initial/Assessment Note    Patient Details  Name: Katelyn [name] MRN: [id number] Date of Birth: Apr 27, 1950  Transition of Care [facility]) CM/SW Contact:    Bartholomew Crews, RN Phone Number: [phone] 10/21/2019, 9:25 AM  Clinical Narrative:                  Patient currently with observation status. Recent admission. Notified by Butch Penny with [place] of active status for RN, PT, OT. No orders needed for resumption of home health service if patient remains in observation. TOC following for transition needs.   Expected Discharge Plan: [place] Barriers to Discharge: Continued Medical Work up   Patient Goals and CMS Choice        Expected Discharge Plan and Services Expected Discharge Plan: [place]   Discharge Planning Services: CM Consult                               HH Arranged: RN, PT, OT [facility] Agency: [place] Date HH Agency Contacted: 10/21/19 Time [place]: [phone] Representative spoke with at [place]: [place] Arrangements/Services                  Current home services: DME    Activities of Daily Living Home Assistive Devices/Equipment: Nebulizer, Environmental consultant (specify type) (4 wheeled walker) ADL Screening (condition at time of admission) Patient's cognitive ability adequate to safely complete daily activities?: Yes Is the patient deaf or have difficulty hearing?: No Does the patient have difficulty seeing, even when wearing glasses/contacts?: No Does the patient have difficulty concentrating, remembering, or making decisions?: No Patient able to express need for assistance with ADLs?: Yes Does the patient have difficulty dressing or bathing?: Yes Independently performs ADLs?: No Communication: Independent Dressing (OT): Needs assistance Is this a change from baseline?: Pre-admission baseline Grooming: Needs assistance Is this a change  from baseline?: Pre-admission baseline Feeding: Needs assistance Is this a change from baseline?: Pre-admission baseline Bathing: Needs assistance Is this a change from baseline?: Pre-admission baseline Toileting: Needs assistance Is this a change from baseline?: Pre-admission baseline In/Out Bed: Needs assistance Is this a change from baseline?: Pre-admission baseline Walks in Home: Needs assistance Is this a change from baseline?: Pre-admission baseline Does the patient have difficulty walking or climbing stairs?: Yes Weakness of Legs: Both Weakness of Arms/Hands: Both  Permission Sought/Granted                  Emotional Assessment              Admission diagnosis:  SOB (shortness of breath) [R06.02] Acute respiratory failure with hypoxia (HCC) [J96.01] Acute hypoxemic respiratory failure (HCC) [J96.01] Pneumonia of right lower lobe due to infectious organism [J18.9] Multiple tracheobronchial mucus plugs [J98.09] Patient Active Problem List   Diagnosis Date Noted  . Goals of care, counseling/discussion   . Palliative care by specialist   . History of noncompliance with medical treatment   . Acute respiratory failure with hypoxia ([place]) 10/06/2019  . COPD exacerbation ([place]) 10/06/2019  . Generalized weakness 07/12/2017  . Palliative care encounter 07/12/2017  . 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 Pressure injury of skin 05/30/2017  . Malnutrition of moderate degree 05/30/2017  . Hip fx, right, closed, initial encounter ([place]) 05/29/2017  . Closed displaced intertrochanteric fracture of right femur ([place]) 05/29/2017  . Dysphagia  05/29/2017  . Weight loss 05/29/2017  . Difficult airway for intubation 05/29/2017  . Lesion of epiglottis 05/26/2014  . Abnormal CT scan, pancreas or bile duct   . Right leg DVT ([place]) 05/08/2014  . Anemia 01/09/2014  . Hip pain, right 05/06/2011  . 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 CKD (chronic kidney disease)  stage 3, GFR 30-59 ml/min (HCC) 03/04/2011  . Diastolic dysfunction 37/29/0211  . Tobacco abuse 03/04/2011  . Constipation 03/04/2011  . Malignant hypertensive urgency 03/01/2011  . Cerebral infarction ([place]) 03/01/2011  . NEUROPATHY, IDIOPATHIC 10/25/2007  . Hypercholesteremia 12/15/2006  . Essential hypertension 12/15/2006   PCP:  Aretta Nip, MD Pharmacy:   [facility] [place] - [address] [place] [address] Phone: [phone] Fax: [phone]     Social Determinants of Health (SDOH) Interventions    Readmission Risk Interventions No flowsheet data found.

## 2019-10-21 NOTE — Progress Notes (Signed)
Rehab Admissions Coordinator Note:  Patient was screened by Cleatrice Burke for appropriateness for an Inpatient Acute Rehab Consult per PT and OT recs. Noted patient under Medicare observation status. Would like to see how patient progresses over weekend functionally. If patient remains in house Monday, I will follow up and assess if CIR would be an option. Patient not a candidate for inpatient acute hospital rehab/CIR when felt medically observation status on acute.  Cleatrice Burke RN MSN 10/21/2019, 1:06 PM  I can be reached at [phone].

## 2019-10-21 NOTE — Progress Notes (Signed)
Initial Nutrition Assessment  DOCUMENTATION CODES:   Not applicable  INTERVENTION:   - Downgrade diet to Dysphagia 3 for ease of intake  - Ensure Enlive po BID, each supplement provides 350 kcal and 20 grams of protein (vanilla flavor)  NUTRITION DIAGNOSIS:   Increased nutrient needs related to chronic illness (COPD) as evidenced by estimated needs.  GOAL:   Patient will meet greater than or equal to 90% of their needs  MONITOR:   PO intake, Supplement acceptance, Labs, Weight trends  REASON FOR ASSESSMENT:   Consult COPD Protocol  ASSESSMENT:   69 year old female who presented on 8/26 with SOB. PMH of gallstone pancreatitis, AKI, anemia, throat cancer s/ptracheostomy, right hip fracture, HLD, HTN, DVT, CVA, COPD. Pt admitted with acute hypoxemic respiratory failure secondary to HCAP and COPD exacerbation.   Spoke with pt at bedside. Pt on trach collar with difficulty communicating with RD and frequent coughing spells. Pt able to provide limited history.  Pt reporting decreased appetite and states that at home she eats 1 meal daily. Pt reports that she has lost some weight but unable to quantify. Reviewed weight history in chart. Pt with a 1 kg weight loss since 10/07/19. This is a 1.6% weight loss in 2 weeks which is not significant for timeframe. RD will continue to monitor trends during admission.  Pt reports acceptance of vanilla Ensure Enlive. RD to order.  Medications reviewed and include: prednisone, IV abx  Labs reviewed.  NUTRITION - FOCUSED PHYSICAL EXAM:    Most Recent Value  Orbital Region Mild depletion  Upper Arm Region Mild depletion  Thoracic and Lumbar Region No depletion  Buccal Region No depletion  Temple Region No depletion  Clavicle Bone Region Mild depletion  Clavicle and Acromion Bone Region Mild depletion  Scapular Bone Region No depletion  Dorsal Hand No depletion  Patellar Region No depletion  Anterior Thigh Region No depletion   Posterior Calf Region No depletion  Edema (RD Assessment) None  Hair Reviewed  Eyes Reviewed  Mouth Reviewed  Skin Reviewed  Nails Reviewed       Diet Order:   Diet Order            DIET DYS 3 Room service appropriate? Yes; Fluid consistency: Thin  Diet effective now                 EDUCATION NEEDS:   No education needs have been identified at this time  Skin:  Skin Assessment: Reviewed RN Assessment  Last BM:  10/20/19  Height:   Ht Readings from Last 1 Encounters:  10/20/19 4\' 11"  (1.499 m)    Weight:   Wt Readings from Last 1 Encounters:  10/20/19 60.3 kg    BMI:  Body mass index is 26.85 kg/m.  Estimated Nutritional Needs:   Kcal:  1500-1700  Protein:  70-85 grams  Fluid:  1.5-1.7 L    Gaynell Face, MS, RD, LDN Inpatient Clinical Dietitian Please see AMi[facility] for contact information.

## 2019-10-21 NOTE — Care Management Obs Status (Signed)
[place] NOTIFICATION   Patient Details  Name: Cabella Kimm MRN: [id number] Date of Birth: 12-Apr-1950   Medicare Observation Status Notification Given:  Yes    Joanne Chars, LCSW 10/21/2019, 10:38 AM

## 2019-10-21 NOTE — Evaluation (Signed)
Occupational Therapy Evaluation Patient Details Name: Katelyn [name] MRN: [id number] DOB: 1951/01/19 Today's Date: 10/21/2019    History of Present Illness Katelyn [name] is a 69 y.o. female with medical history significant of laryngeal cancer s/p laryngectomy, tracheostomy, gallstone pancreatitis, history of right hip fracture, hyperlipidemia, hypertension, right lower extremity DVT, history of Klebsiella septic shock, history of other nonhemorrhagic CVA with right-sided weakness who was discharged 10/18/19 after a 12-day hospitalization due to respiratory failure secondary to COPD exacerbation, bilateral pneumonia and aspiration pneumonia.  She returns to the emergency department after complaining of exacerbation of dyspnea twice overnight requiring each time to be seen by EMS.    Clinical Impression   PTA, pt lives with fiance and reports Modified Independence with mobility and ADLs using Rollator. Pt reports family frequently are in and out of home to assist as needed. Pt presents now with diagnoses above and deficits in strength, endurance, and standing balance. Pt requires up to [place] for ADL transfers using RW and Min A to LB ADLs. Pt with unsteadiness that improved with increased activity. VSS on 5 L O2. Recommend [place] supervision/assist at home. If 24/7 unable to be provided for any reason, recommend SNF for ST rehab.     Follow Up Recommendations  Home health OT;Supervision/Assistance - 24 hour (SNF if 24/7 not available)    Equipment Recommendations  None recommended by OT (appears to have all needed DME)    Recommendations for Other Services       Precautions / Restrictions Precautions Precautions: Fall Precaution Comments: hx of trach, laryngeal CA Restrictions Weight Bearing Restrictions: No      Mobility Bed Mobility               General bed mobility comments: sitting EOB on OT arrival  Transfers Overall transfer level: Needs assistance Equipment  used: Rolling walker (2 wheeled);None Transfers: Sit to/from [employer] to Stand: Min assist Stand pivot transfers: Min assist       General transfer comment: Min A for power up and stabilizing with RW, assistance for RW manuevering and turning    Balance Overall balance assessment: Needs assistance Sitting-balance support: Bilateral upper extremity supported;Feet supported Sitting balance-Leahy Scale: Fair Sitting balance - Comments: sitting EOB on arrival   Standing balance support: Bilateral upper extremity supported Standing balance-Leahy Scale: Poor Standing balance comment: reliant on RW or reaching for armrests during transitional movements                           ADL either performed or assessed with clinical judgement   ADL Overall ADL's : Needs assistance/impaired Eating/Feeding: Independent;Sitting   Grooming: Min guard;Standing   Upper Body Bathing: Set up;Sitting   Lower Body Bathing: Minimal assistance;Sit to/from stand   Upper Body Dressing : Set up;Sitting   Lower Body Dressing: Minimal assistance;Sit to/from stand   Toilet Transfer: Minimal assistance;Stand-pivot;BSC;RW Armed forces technical officer Details (indicate cue type and reason): Min A for transfer, assistance and cueing needed to turn with RW and maintain balance initially Toileting- Clothing Manipulation and Hygiene: Minimal assistance;Sit to/from stand         General ADL Comments: Pt with decreased endurance, strength, dynamic standing balance impacting ability to complete ADLs/mobility without hands on assist. Pt reports feeling better today than on arrival      Vision Baseline Vision/History: No visual deficits Patient Visual Report: No change from baseline Vision Assessment?: No apparent visual deficits  Perception     Praxis      Pertinent Vitals/Pain Pain Assessment: No/denies pain     Hand Dominance Right   Extremity/Trunk Assessment Upper  Extremity Assessment Upper Extremity Assessment: Generalized weakness   Lower Extremity Assessment Lower Extremity Assessment: Defer to PT evaluation   Cervical / Trunk Assessment Cervical / Trunk Assessment: Kyphotic   Communication Communication Communication: Tracheostomy   Cognition Arousal/Alertness: Awake/alert Behavior During Therapy: WFL for tasks assessed/performed Overall Cognitive Status: Within Functional Limits for tasks assessed                                 General Comments: non verbal but appropriate responses and answers by mouthing words. Appears WFL   General Comments  Pt on 5 L O2, SpO2, HR and BP WFL during session.     Exercises     Shoulder Instructions      Home Living   Living Arrangements: Non-relatives/Friends (boyfriend) Available Help at Discharge: Family;Friend(s);Available 24 hours/day Type of Home: House Home Access: Ramped entrance     Home Layout: One level     Bathroom Shower/Tub: Occupational psychologist: Standard Bathroom Accessibility: Yes   Home Equipment: Clinical cytogeneticist - 4 wheels;Cane - single point;Toilet riser;Bedside commode   Additional Comments: Family frequently checking in on pt, fiance retired and many family members can physically assist      Prior Functioning/Environment Level of Independence: Independent with assistive device(s)        Comments: ambulatory with rollator around home. Reports able to complete ADLs without assist        OT Problem List: Decreased activity tolerance;Impaired balance (sitting and/or standing);Decreased strength;Decreased safety awareness;Decreased knowledge of use of DME or AE;Cardiopulmonary status limiting activity      OT Treatment/Interventions: Self-care/ADL training;Therapeutic exercise;DME and/or AE instruction;Therapeutic activities;Balance training;Patient/family education    OT Goals(Current goals can be found in the care plan section)  Acute Rehab OT Goals Patient Stated Goal: go home, be able to breathe better OT Goal Formulation: With patient Time For Goal Achievement: 11/04/19 Potential to Achieve Goals: Good ADL Goals Pt Will Perform Grooming: with modified independence;standing Pt Will Perform Lower Body Bathing: with modified independence;sit to/from stand;sitting/lateral leans Pt Will Perform Lower Body Dressing: with modified independence;sitting/lateral leans;sit to/from stand Pt Will Transfer to Toilet: with supervision;ambulating;regular height toilet Pt Will Perform Toileting - Clothing Manipulation and hygiene: with modified independence;sit to/from stand;sitting/lateral leans Additional ADL Goal #1: Pt to demonstrate implementation of at least 2 energy conservation strategies during ADLs  OT Frequency: Min 2X/week   Barriers to D/C:            Co-evaluation              AM-PAC OT "6 Clicks" Daily Activity     Outcome Measure Help from another person eating meals?: None Help from another person taking care of personal grooming?: A Little Help from another person toileting, which includes using toliet, bedpan, or urinal?: A Little Help from another person bathing (including washing, rinsing, drying)?: A Little Help from another person to put on and taking off regular upper body clothing?: A Little Help from another person to put on and taking off regular lower body clothing?: A Little 6 Click Score: 19   End of Session Equipment Utilized During Treatment: Gait belt;Rolling walker;Oxygen Nurse Communication: Mobility status  Activity Tolerance: Patient tolerated treatment well Patient left: in chair;with call bell/phone within  reach  OT Visit Diagnosis: Unsteadiness on feet (R26.81);Muscle weakness (generalized) (M62.81)                Time: 2527-1292 OT Time Calculation (min): 37 min Charges:  OT General Charges $OT Visit: 1 Visit OT Evaluation $OT Eval Moderate Complexity: 1 Mod OT  Treatments $Self Care/Home Management : 8-22 mins  Layla Maw, OTR/L  Layla Maw 10/21/2019, 8:28 AM

## 2019-10-21 NOTE — Progress Notes (Signed)
PROGRESS NOTE    Katelyn [name]  ZOX:[id number] DOB: 28-Nov-1950 DOA: 10/20/2019 PCP: Aretta Nip, MD     Brief Narrative:  Katelyn Hamiltonis a 69 y.o.femalewith medical history significant oflaryngeal cancer s/p laryngectomy, tracheostomy, gallstone pancreatitis, history of right hip fracture, hyperlipidemia, hypertension, right lower extremity DVT, history of Klebsiella septic shock, history of other nonhemorrhagic CVA with right-sided weakness who was discharged 10/18/19 after a 12-day hospitalization due to respiratory failure secondary to COPD exacerbation, bilateral pneumonia and aspiration pneumonia. She returns to the emergency department after complaining of exacerbation of dyspnea twice overnight requiring each time to be seen by EMS. She declined to come to the hospital the first time, but subsequently her symptoms worsen again and EMS convinced the second time to return to the hospital. EMS described that the patient was satting 90% on room air, but did not improve with supplemental oxygen. The patient was vigorously suctioned in the emergency department which for the use significant relief of her symptoms. Imaging in the emergency department revealed right-sided pneumonia.  Patient was started on vancomycin, cefepime.  She was also started on Solu-Medrol for suspected COPD exacerbation.  New events last 24 hours / Subjective: Doing much better today, although not back to baseline. She is working with OT this morning, sitting in chair.   Assessment & Plan:   Principal Problem:   Acute respiratory failure with hypoxia (HCC) Active Problems:   Hypercholesteremia   Essential hypertension   CKD (chronic kidney disease) stage 3, GFR 30-59 ml/min (HCC)   Diastolic dysfunction   Cancer of hypopharynx (HCC)   Generalized weakness   COPD exacerbation (HCC)   Acute hypoxemic respiratory failure (HCC)   Acute hypoxemic respiratory failure secondary to HCAP -MRSA PCR  negative.  Stop vancomycin -Procalcitonin negative -Covid negative -Continue empiric cefepime -Continue to suction as needed -Trach care  COPD exacerbation -Continue Solu-Medrol, breathing treatment  Hyperlipidemia -Continue atorvastatin  Essential hypertension -Continue metoprolol  CKD stage IIIa -Baseline creatinine around 1.1-1.3 -Stable  History of DVT -Eliquis   DVT prophylaxis:  apixaban (ELIQUIS) tablet 5 mg  Code Status: Full Family Communication: None at bedside Disposition Plan:   Status is: Inpatient  Remains inpatient appropriate because:Inpatient level of care appropriate due to severity of illness   Dispo: The patient is from: Home              Anticipated d/c is to: Home              Anticipated d/c date is: 1 day              Patient currently is not medically stable to d/c.  Continue medical treatment with IV cefepime, IV Solu-Medrol.  Conservative management due to patient's bounce back readmission.     Consultants:   None  Procedures:   None  Antimicrobials:  Anti-infectives (From admission, onward)   Start     Dose/Rate Route Frequency Ordered Stop   10/21/19 1000  vancomycin (VANCOCIN) IVPB 1000 mg/200 mL premix  Status:  Discontinued        1,000 mg 200 mL/hr over 60 Minutes Intravenous Every 24 hours 10/20/19 0653 10/20/19 1406   10/20/19 2200  ceFEPIme (MAXIPIME) 2 g in sodium chloride 0.9 % 100 mL IVPB        2 g 200 mL/hr over 30 Minutes Intravenous Every 12 hours 10/20/19 0632 10/25/19 2159   10/20/19 0615  vancomycin (VANCOCIN) IVPB 1000 mg/200 mL premix        1,000  mg 200 mL/hr over 60 Minutes Intravenous  Once 10/20/19 0603 10/20/19 0747   10/20/19 0600  amoxicillin-clavulanate (AUGMENTIN) 875-125 MG per tablet 1 tablet  Status:  Discontinued        1 tablet Oral  Once 10/20/19 0548 10/20/19 0553   10/20/19 0600  ceFEPIme (MAXIPIME) 2 g in sodium chloride 0.9 % 100 mL IVPB        2 g 200 mL/hr over 30 Minutes  Intravenous  Once 10/20/19 0554 10/20/19 0716        Objective: Vitals:   10/21/19 0845 10/21/19 0954 10/21/19 1000 10/21/19 1157  BP:    109/65  Pulse: 70  79 (!) 56  Resp: (!) 26  14 16   Temp:    (!) 97.5 F (36.4 C)  TempSrc:      SpO2: 93% 92% 90%   Weight:      Height:        Intake/Output Summary (Last 24 hours) at 10/21/2019 1205 Last data filed at 10/21/2019 0800 Gross per 24 hour  Intake 120 ml  Output --  Net 120 ml   Filed Weights   10/20/19 0357 10/20/19 0612 10/20/19 2200  Weight: 61.3 kg 61.3 kg 60.3 kg    Examination:  General exam: Appears calm and comfortable  Respiratory system: On trach collar, diminished breath sounds bilaterally without distress Cardiovascular system: S1 & S2 heard, RRR. No murmurs. No pedal edema. Gastrointestinal system: Abdomen is nondistended, soft and nontender. Normal bowel sounds heard. Central nervous system: Alert and oriented. No focal neurological deficits. Speech clear.  Extremities: Symmetric in appearance  Skin: No rashes, lesions or ulcers on exposed skin  Psychiatry: Judgement and insight appear normal. Mood & affect appropriate.   Data Reviewed: I have personally reviewed following labs and imaging studies  CBC: Recent Labs  Lab 10/15/19 0602 10/17/19 0547 10/20/19 0356 10/21/19 0157  WBC 9.5 10.3 13.7* 13.3*  NEUTROABS  --  7.1 11.6*  --   HGB 11.7* 11.9* 12.0 9.6*  HCT 39.2 39.7 40.8 31.9*  MCV 96.1 96.8 96.0 93.3  PLT 272 257 286 093   Basic Metabolic Panel: Recent Labs  Lab 10/15/19 0602 10/17/19 0547 10/20/19 0356 10/21/19 0157  NA 141 142 139 140  K 4.2 4.5 4.1 4.7  CL 105 105 103 107  CO2 25 26 25  21*  GLUCOSE 84 80 120* 153*  BUN 38* 30* 17 29*  CREATININE 1.13* 1.03* 1.12* 1.34*  CALCIUM 8.8* 9.1 9.1 8.9   GFR: Estimated Creatinine Clearance: 30.8 mL/min (A) (by C-G formula based on SCr of 1.34 mg/dL (H)). Liver Function Tests: Recent Labs  Lab 10/20/19 0356  AST 22  ALT 20   ALKPHOS 90  BILITOT 0.7  PROT 7.1  ALBUMIN 3.2*   No results for input(s): LIPASE, AMYLASE in the last 168 hours. No results for input(s): AMMONIA in the last 168 hours. Coagulation Profile: No results for input(s): INR, PROTIME in the last 168 hours. Cardiac Enzymes: No results for input(s): CKTOTAL, CKMB, CKMBINDEX, TROPONINI in the last 168 hours. BNP (last 3 results) No results for input(s): PROBNP in the last 8760 hours. HbA1C: No results for input(s): HGBA1C in the last 72 hours. CBG: No results for input(s): GLUCAP in the last 168 hours. Lipid Profile: No results for input(s): CHOL, HDL, LDLCALC, TRIG, CHOLHDL, LDLDIRECT in the last 72 hours. Thyroid Function Tests: No results for input(s): TSH, T4TOTAL, FREET4, T3FREE, THYROIDAB in the last 72 hours. Anemia Panel: No results for  input(s): VITAMINB12, FOLATE, FERRITIN, TIBC, IRON, RETICCTPCT in the last 72 hours. Sepsis Labs: Recent Labs  Lab 10/20/19 1024  PROCALCITON <0.10    Recent Results (from the past 240 hour(s))  SARS Coronavirus 2 by RT PCR (hospital order, performed in [facility] hospital lab) Nasopharyngeal Nasopharyngeal Swab     Status: None   Collection Time: 10/20/19  4:01 AM   Specimen: Nasopharyngeal Swab  Result Value Ref Range Status   SARS Coronavirus 2 NEGATIVE NEGATIVE Final    Comment: (NOTE) SARS-CoV-2 target nucleic acids are NOT DETECTED.  The SARS-CoV-2 RNA is generally detectable in upper and lower respiratory specimens during the acute phase of infection. The lowest concentration of SARS-CoV-2 viral copies this assay can detect is 250 copies / mL. A negative result does not preclude SARS-CoV-2 infection and should not be used as the sole basis for treatment or other patient management decisions.  A negative result may occur with improper specimen collection / handling, submission of specimen other than nasopharyngeal swab, presence of viral mutation(s) within the areas targeted by  this assay, and inadequate number of viral copies (<250 copies / mL). A negative result must be combined with clinical observations, patient history, and epidemiological information.  Fact Sheet for Patients:   StrictlyIdeas.no  Fact Sheet for Healthcare Providers: BankingDealers.co.za  This test is not yet approved or  cleared by the [place] FDA and has been authorized for detection and/or diagnosis of SARS-CoV-2 by FDA under an Emergency Use Authorization (EUA).  This EUA will remain in effect (meaning this test can be used) for the duration of the COVID-19 declaration under Section 564(b)(1) of the Act, 21 U.S.C. section 360bbb-3(b)(1), unless the authorization is terminated or revoked sooner.  Performed at [facility], [place] [address], [place], [place] [address]   MRSA PCR Screening     Status: None   Collection Time: 10/20/19 10:24 AM   Specimen: Nasal Mucosa; Nasopharyngeal  Result Value Ref Range Status   MRSA by PCR NEGATIVE NEGATIVE Final    Comment:        The GeneXpert MRSA Assay (FDA approved for NASAL specimens only), is one component of a comprehensive MRSA colonization surveillance program. It is not intended to diagnose MRSA infection nor to guide or monitor treatment for MRSA infections. Performed at [facility], [place] [address], [place], [place] [address]       Radiology Studies: DG Chest Port 1 View  Result Date: 10/20/2019 CLINICAL DATA:  Shortness of breath EXAM: PORTABLE CHEST 1 VIEW COMPARISON:  Three days ago FINDINGS: Streaky density at the lung bases. No Kerley lines, effusion, or pneumothorax. Prominent left ventricular contour but overall normal heart size for portable technique. Aortic tortuosity. Postoperative thoracic inlet. IMPRESSION: Atelectasis or infiltrate at the right base. Electronically Signed   By: Monte Fantasia M.D.   On: 10/20/2019 04:18      Scheduled Meds: .  acetylcysteine  2 mL Nebulization QID  . albuterol  2.5 mg Nebulization Q6H  . apixaban  5 mg Oral BID  . atorvastatin  80 mg Oral Daily  . guaiFENesin-dextromethorphan  10 mL Oral Q6H  . metoprolol tartrate  25 mg Oral BID  . predniSONE  40 mg Oral Q breakfast   Continuous Infusions: . albuterol Stopped (10/20/19 0903)  . ceFEPime (MAXIPIME) IV 2 g (10/21/19 0914)     LOS: 0 days      Time spent: 25 minutes   Dessa Phi, DO [facility]  10/21/2019, 12:05 PM   Available via Epic secure chat 7am-7pm After these hours, please refer to coverage provider listed on [email or web address]

## 2019-10-22 LAB — CBC
HCT: 32.5 % — ABNORMAL LOW (ref 36.0–46.0)
Hemoglobin: 9.6 g/dL — ABNORMAL LOW (ref 12.0–15.0)
MCH: 28.1 pg (ref 26.0–34.0)
MCHC: 29.5 g/dL — ABNORMAL LOW (ref 30.0–36.0)
MCV: 95 fL (ref 80.0–100.0)
Platelets: 237 10*3/uL (ref 150–400)
RBC: 3.42 MIL/uL — ABNORMAL LOW (ref 3.87–5.11)
RDW: 12.9 % (ref 11.5–15.5)
WBC: 13.4 10*3/uL — ABNORMAL HIGH (ref 4.0–10.5)
nRBC: 0 % (ref 0.0–0.2)

## 2019-10-22 LAB — BASIC METABOLIC PANEL
Anion gap: 9 (ref 5–15)
BUN: 38 mg/dL — ABNORMAL HIGH (ref 8–23)
CO2: 23 mmol/L (ref 22–32)
Calcium: 9.1 mg/dL (ref 8.9–10.3)
Chloride: 108 mmol/L (ref 98–111)
Creatinine, Ser: 1.27 mg/dL — ABNORMAL HIGH (ref 0.44–1.00)
GFR calc Af Amer: 50 mL/min — ABNORMAL LOW (ref >60)
GFR calc non Af Amer: 43 mL/min — ABNORMAL LOW (ref >60)
Glucose, Bld: 112 mg/dL — ABNORMAL HIGH (ref 70–99)
Potassium: 4.6 mmol/L (ref 3.5–5.1)
Sodium: 140 mmol/L (ref 135–145)

## 2019-10-22 NOTE — Progress Notes (Signed)
[facility]  Katelyn [name] was referred to our palliative care services in the community at discharge of her last hospitalization.    She was re-admitted prior to having our services established.   [facility] will follow while hospitalized.  Venia Carbon RN, BSN, [facility] Liaison

## 2019-10-22 NOTE — Plan of Care (Signed)
  Problem: Education: Goal: Knowledge of General Education information will improve Description: Including pain rating scale, medication(s)/side effects and non-pharmacologic comfort measures Outcome: Progressing   Problem: Health Behavior/Discharge Planning: Goal: Ability to manage health-related needs will improve Outcome: Progressing   Problem: Clinical Measurements: Goal: Ability to maintain clinical measurements within normal limits will improve Outcome: Progressing Goal: Will remain free from infection Outcome: Progressing Goal: Diagnostic test results will improve Outcome: Progressing Goal: Respiratory complications will improve Outcome: Progressing Goal: Cardiovascular complication will be avoided Outcome: Progressing   Problem: Activity: Goal: Risk for activity intolerance will decrease Outcome: Progressing   Problem: Nutrition: Goal: Adequate nutrition will be maintained Outcome: Progressing   Problem: Coping: Goal: Level of anxiety will decrease Outcome: Progressing   Problem: Coping: Goal: Level of anxiety will decrease Outcome: Progressing   Problem: Pain Managment: Goal: General experience of comfort will improve Outcome: Progressing   Problem: Safety: Goal: Ability to remain free from injury will improve Outcome: Progressing   Problem: Safety: Goal: Ability to remain free from injury will improve Outcome: Progressing   Problem: Skin Integrity: Goal: Risk for impaired skin integrity will decrease Outcome: Progressing

## 2019-10-22 NOTE — Progress Notes (Signed)
PROGRESS NOTE    Katelyn [name]  GDJ:[id number] DOB: 03/04/1950 DOA: 10/20/2019 PCP: Aretta Nip, MD     Brief Narrative:  Katelyn Hamiltonis a 69 y.o.femalewith medical history significant oflaryngeal cancer s/p laryngectomy, tracheostomy, gallstone pancreatitis, history of right hip fracture, hyperlipidemia, hypertension, right lower extremity DVT, history of Klebsiella septic shock, history of other nonhemorrhagic CVA with right-sided weakness who was discharged 10/18/19 after a 12-day hospitalization due to respiratory failure secondary to COPD exacerbation, bilateral pneumonia and aspiration pneumonia. She returns to the emergency department after complaining of exacerbation of dyspnea twice overnight requiring each time to be seen by EMS. She declined to come to the hospital the first time, but subsequently her symptoms worsen again and EMS convinced the second time to return to the hospital. EMS described that the patient was satting 90% on room air, but did not improve with supplemental oxygen. The patient was vigorously suctioned in the emergency department which for the use significant relief of her symptoms. Imaging in the emergency department revealed right-sided pneumonia.  Patient was started on vancomycin, cefepime.  She was also started on Solu-Medrol for suspected COPD exacerbation.  New events last 24 hours / Subjective: States that her breathing is better to baseline, requiring 5 L oxygen   Assessment & Plan:   Principal Problem:   Acute respiratory failure with hypoxia (HCC) Active Problems:   Hypercholesteremia   Essential hypertension   CKD (chronic kidney disease) stage 3, GFR 30-59 ml/min (HCC)   Diastolic dysfunction   Cancer of hypopharynx (HCC)   Generalized weakness   COPD exacerbation (HCC)   Acute hypoxemic respiratory failure (HCC)   Acute hypoxemic respiratory failure secondary to HCAP -MRSA PCR negative.  Stop  vancomycin -Procalcitonin negative -Covid negative -Continue empiric cefepime -Continue to suction as needed -Trach care -Continue to wean oxygen as able  COPD exacerbation -Continue prednison, breathing treatment  Hyperlipidemia -Continue atorvastatin  Essential hypertension -Continue metoprolol  CKD stage IIIa -Baseline creatinine around 1.1-1.3 -Stable  History of DVT -Eliquis   DVT prophylaxis:  apixaban (ELIQUIS) tablet 5 mg  Code Status: Full Family Communication: None at bedside Disposition Plan:   Status is: Inpatient  Remains inpatient appropriate because:Inpatient level of care appropriate due to severity of illness   Dispo: The patient is from: Home              Anticipated d/c is to: [facility]              Anticipated d/c date is: 1 day              Patient currently is not medically stable to d/c.  Continue medical treatment with IV cefepime.  Conservative management due to patient's bounce back readmission.  Continue to wean oxygen as able     Consultants:   None  Procedures:   None  Antimicrobials:  Anti-infectives (From admission, onward)   Start     Dose/Rate Route Frequency Ordered Stop   10/21/19 1000  vancomycin (VANCOCIN) IVPB 1000 mg/200 mL premix  Status:  Discontinued        1,000 mg 200 mL/hr over 60 Minutes Intravenous Every 24 hours 10/20/19 0653 10/20/19 1406   10/20/19 2200  ceFEPIme (MAXIPIME) 2 g in sodium chloride 0.9 % 100 mL IVPB        2 g 200 mL/hr over 30 Minutes Intravenous Every 12 hours 10/20/19 0632 10/25/19 2159   10/20/19 0615  vancomycin (VANCOCIN) IVPB 1000 mg/200 mL premix  1,000 mg 200 mL/hr over 60 Minutes Intravenous  Once 10/20/19 0603 10/20/19 0747   10/20/19 0600  amoxicillin-clavulanate (AUGMENTIN) 875-125 MG per tablet 1 tablet  Status:  Discontinued        1 tablet Oral  Once 10/20/19 0548 10/20/19 0553   10/20/19 0600  ceFEPIme (MAXIPIME) 2 g in sodium chloride 0.9 % 100 mL IVPB        2  g 200 mL/hr over 30 Minutes Intravenous  Once 10/20/19 0554 10/20/19 0716       Objective: Vitals:   10/22/19 0355 10/22/19 0800 10/22/19 0819 10/22/19 0910  BP: 139/68 138/76 110/72   Pulse: 60 (!) 59 100   Resp: 14 (!) 25 (!) 22   Temp: (!) 97.5 F (36.4 C)  98.2 F (36.8 C)   TempSrc: Axillary     SpO2: 97% 96%  96%  Weight:      Height:        Intake/Output Summary (Last 24 hours) at 10/22/2019 1040 Last data filed at 10/21/2019 1500 Gross per 24 hour  Intake 200 ml  Output --  Net 200 ml   Filed Weights   10/20/19 0357 10/20/19 0612 10/20/19 2200  Weight: 61.3 kg 61.3 kg 60.3 kg    Examination: General exam: Appears calm and comfortable  Respiratory system: Decreased breath sound, respiratory effort normal.  On trach collar Cardiovascular system: S1 & S2 heard, RRR. No pedal edema. Gastrointestinal system: Abdomen is nondistended, soft and nontender. Normal bowel sounds heard. Central nervous system: Alert and oriented. Non focal exam. Speech clear  Extremities: Symmetric in appearance bilaterally  Skin: No rashes, lesions or ulcers on exposed skin  Psychiatry: Judgement and insight appear stable. Mood & affect appropriate.   Data Reviewed: I have personally reviewed following labs and imaging studies  CBC: Recent Labs  Lab 10/17/19 0547 10/20/19 0356 10/21/19 0157 10/22/19 0053  WBC 10.3 13.7* 13.3* 13.4*  NEUTROABS 7.1 11.6*  --   --   HGB 11.9* 12.0 9.6* 9.6*  HCT 39.7 40.8 31.9* 32.5*  MCV 96.8 96.0 93.3 95.0  PLT 257 286 229 347   Basic Metabolic Panel: Recent Labs  Lab 10/17/19 0547 10/20/19 0356 10/21/19 0157 10/22/19 0053  NA 142 139 140 140  K 4.5 4.1 4.7 4.6  CL 105 103 107 108  CO2 26 25 21* 23  GLUCOSE 80 120* 153* 112*  BUN 30* 17 29* 38*  CREATININE 1.03* 1.12* 1.34* 1.27*  CALCIUM 9.1 9.1 8.9 9.1   GFR: Estimated Creatinine Clearance: 32.5 mL/min (A) (by C-G formula based on SCr of 1.27 mg/dL (H)). Liver Function  Tests: Recent Labs  Lab 10/20/19 0356  AST 22  ALT 20  ALKPHOS 90  BILITOT 0.7  PROT 7.1  ALBUMIN 3.2*   No results for input(s): LIPASE, AMYLASE in the last 168 hours. No results for input(s): AMMONIA in the last 168 hours. Coagulation Profile: No results for input(s): INR, PROTIME in the last 168 hours. Cardiac Enzymes: No results for input(s): CKTOTAL, CKMB, CKMBINDEX, TROPONINI in the last 168 hours. BNP (last 3 results) No results for input(s): PROBNP in the last 8760 hours. HbA1C: No results for input(s): HGBA1C in the last 72 hours. CBG: Recent Labs  Lab 10/21/19 2016 10/21/19 2328  GLUCAP 106* 217*   Lipid Profile: No results for input(s): CHOL, HDL, LDLCALC, TRIG, CHOLHDL, LDLDIRECT in the last 72 hours. Thyroid Function Tests: No results for input(s): TSH, T4TOTAL, FREET4, T3FREE, THYROIDAB in the last 72  hours. Anemia Panel: No results for input(s): VITAMINB12, FOLATE, FERRITIN, TIBC, IRON, RETICCTPCT in the last 72 hours. Sepsis Labs: Recent Labs  Lab 10/20/19 1024  PROCALCITON <0.10    Recent Results (from the past 240 hour(s))  SARS Coronavirus 2 by RT PCR (hospital order, performed in [facility] hospital lab) Nasopharyngeal Nasopharyngeal Swab     Status: None   Collection Time: 10/20/19  4:01 AM   Specimen: Nasopharyngeal Swab  Result Value Ref Range Status   SARS Coronavirus 2 NEGATIVE NEGATIVE Final    Comment: (NOTE) SARS-CoV-2 target nucleic acids are NOT DETECTED.  The SARS-CoV-2 RNA is generally detectable in upper and lower respiratory specimens during the acute phase of infection. The lowest concentration of SARS-CoV-2 viral copies this assay can detect is 250 copies / mL. A negative result does not preclude SARS-CoV-2 infection and should not be used as the sole basis for treatment or other patient management decisions.  A negative result may occur with improper specimen collection / handling, submission of specimen other than  nasopharyngeal swab, presence of viral mutation(s) within the areas targeted by this assay, and inadequate number of viral copies (<250 copies / mL). A negative result must be combined with clinical observations, patient history, and epidemiological information.  Fact Sheet for Patients:   StrictlyIdeas.no  Fact Sheet for Healthcare Providers: BankingDealers.co.za  This test is not yet approved or  cleared by the [place] FDA and has been authorized for detection and/or diagnosis of SARS-CoV-2 by FDA under an Emergency Use Authorization (EUA).  This EUA will remain in effect (meaning this test can be used) for the duration of the COVID-19 declaration under Section 564(b)(1) of the Act, 21 U.S.C. section 360bbb-3(b)(1), unless the authorization is terminated or revoked sooner.  Performed at [facility], [place] [address] [place], [place] [address]   MRSA PCR Screening     Status: None   Collection Time: 10/20/19 10:24 AM   Specimen: Nasal Mucosa; Nasopharyngeal  Result Value Ref Range Status   MRSA by PCR NEGATIVE NEGATIVE Final    Comment:        The GeneXpert MRSA Assay (FDA approved for NASAL specimens only), is one component of a comprehensive MRSA colonization surveillance program. It is not intended to diagnose MRSA infection nor to guide or monitor treatment for MRSA infections. Performed at [facility], [place] [address], [place], [place] [address]       Radiology Studies: No results found.    Scheduled Meds: . acetylcysteine  2 mL Nebulization QID  . albuterol  2.5 mg Nebulization Q6H  . apixaban  5 mg Oral BID  . atorvastatin  80 mg Oral Daily  . feeding supplement (ENSURE ENLIVE)  237 mL Oral BID BM  . guaiFENesin-dextromethorphan  10 mL Oral Q6H  . metoprolol tartrate  25 mg Oral BID  . predniSONE  40 mg Oral Q breakfast   Continuous Infusions: . albuterol Stopped (10/20/19 0903)  .  ceFEPime (MAXIPIME) IV 2 g (10/22/19 0900)     LOS: 1 day      Time spent: 25 minutes   Dessa Phi, DO [facility] 10/22/2019, 10:40 AM   Available via Epic secure chat 7am-7pm After these hours, please refer to coverage provider listed on [email or web address]

## 2019-10-22 NOTE — Progress Notes (Signed)
Stoma care done.

## 2019-10-23 LAB — GLUCOSE, CAPILLARY
Glucose-Capillary: 128 mg/dL — ABNORMAL HIGH (ref 70–99)
Glucose-Capillary: 100 mg/dL — ABNORMAL HIGH (ref 70–99)

## 2019-10-23 MED ORDER — INSULIN ASPART 100 UNIT/ML ~~LOC~~ SOLN: 0.0000 [IU] | Freq: Three times a day (TID) | SUBCUTANEOUS | Status: DC

## 2019-10-23 MED ORDER — ALBUTEROL SULFATE (2.5 MG/3ML) 0.083% IN NEBU
2.5000 mg | INHALATION_SOLUTION | Freq: Two times a day (BID) | RESPIRATORY_TRACT | Status: DC
  Administered 2019-10-23 – 2019-10-25 (×4): 2.5 mg via RESPIRATORY_TRACT
  Filled 2019-10-23 (×4): qty 3

## 2019-10-23 MED ORDER — ACETYLCYSTEINE 20 % IN SOLN
2.0000 mL | Freq: Two times a day (BID) | RESPIRATORY_TRACT | Status: DC
  Administered 2019-10-23: 2 mL via RESPIRATORY_TRACT
  Filled 2019-10-23 (×2): qty 4

## 2019-10-23 NOTE — Progress Notes (Signed)
PROGRESS NOTE    Katelyn [name]  LKG:[id number] DOB: May 12, 1950 DOA: 10/20/2019 PCP: Aretta Nip, MD     Brief Narrative:  Katelyn Hamiltonis a 69 y.o.femalewith medical history significant oflaryngeal cancer s/p laryngectomy, tracheostomy, gallstone pancreatitis, history of right hip fracture, hyperlipidemia, hypertension, right lower extremity DVT, history of Klebsiella septic shock, history of other nonhemorrhagic CVA with right-sided weakness who was discharged 10/18/19 after a 12-day hospitalization due to respiratory failure secondary to COPD exacerbation, bilateral pneumonia and aspiration pneumonia. She returns to the emergency department after complaining of exacerbation of dyspnea twice overnight requiring each time to be seen by EMS. She declined to come to the hospital the first time, but subsequently her symptoms worsen again and EMS convinced the second time to return to the hospital. EMS described that the patient was satting 90% on room air, but did not improve with supplemental oxygen. The patient was vigorously suctioned in the emergency department which for the use significant relief of her symptoms. Imaging in the emergency department revealed right-sided pneumonia.  Patient was started on vancomycin, cefepime.  She was also started on Solu-Medrol for suspected COPD exacerbation.  New events last 24 hours / Subjective: Breathing has improved but still requiring oxygen through trach collar, not quite back to baseline today  Assessment & Plan:   Principal Problem:   Acute respiratory failure with hypoxia (HCC) Active Problems:   Hypercholesteremia   Essential hypertension   CKD (chronic kidney disease) stage 3, GFR 30-59 ml/min (HCC)   Diastolic dysfunction   Cancer of hypopharynx (HCC)   Generalized weakness   COPD exacerbation (HCC)   Acute hypoxemic respiratory failure (HCC)   Acute hypoxemic respiratory failure secondary to HCAP -MRSA PCR  negative.  Stop vancomycin -Procalcitonin negative -Covid negative -Continue empiric cefepime -Continue to suction as needed -Trach care -Continue to wean oxygen as able  COPD exacerbation -Continue prednison, breathing treatment  Hyperlipidemia -Continue atorvastatin  Essential hypertension -Continue metoprolol  CKD stage IIIa -Baseline creatinine around 1.1-1.3 -Stable  History of DVT -Eliquis   DVT prophylaxis:  apixaban (ELIQUIS) tablet 5 mg  Code Status: Full Family Communication: None at bedside Disposition Plan:   Status is: Inpatient  Remains inpatient appropriate because:Inpatient level of care appropriate due to severity of illness   Dispo: The patient is from: Home              Anticipated d/c is to: CIR              Anticipated d/c date is: 1 day              Patient currently is not medically stable to d/c.  Continue medical treatment with IV cefepime.  Conservative management due to patient's bounce back readmission.  Continue to wean oxygen as able     Consultants:   None  Procedures:   None  Antimicrobials:  Anti-infectives (From admission, onward)   Start     Dose/Rate Route Frequency Ordered Stop   10/21/19 1000  vancomycin (VANCOCIN) IVPB 1000 mg/200 mL premix  Status:  Discontinued        1,000 mg 200 mL/hr over 60 Minutes Intravenous Every 24 hours 10/20/19 0653 10/20/19 1406   10/20/19 2200  ceFEPIme (MAXIPIME) 2 g in sodium chloride 0.9 % 100 mL IVPB        2 g 200 mL/hr over 30 Minutes Intravenous Every 12 hours 10/20/19 0632 10/25/19 2159   10/20/19 0615  vancomycin (VANCOCIN) IVPB 1000 mg/200 mL premix  1,000 mg 200 mL/hr over 60 Minutes Intravenous  Once 10/20/19 0603 10/20/19 0747   10/20/19 0600  amoxicillin-clavulanate (AUGMENTIN) 875-125 MG per tablet 1 tablet  Status:  Discontinued        1 tablet Oral  Once 10/20/19 0548 10/20/19 0553   10/20/19 0600  ceFEPIme (MAXIPIME) 2 g in sodium chloride 0.9 % 100 mL  IVPB        2 g 200 mL/hr over 30 Minutes Intravenous  Once 10/20/19 0554 10/20/19 0716       Objective: Vitals:   10/23/19 0753 10/23/19 0755 10/23/19 0807 10/23/19 0919  BP: 136/74     Pulse: 64  (!) 57 68  Resp: 16 16 18    Temp:  98.2 F (36.8 C)    TempSrc:      SpO2: 98%  91%   Weight:      Height:        Intake/Output Summary (Last 24 hours) at 10/23/2019 1051 Last data filed at 10/23/2019 0000 Gross per 24 hour  Intake 200 ml  Output 650 ml  Net -450 ml   Filed Weights   10/20/19 0357 10/20/19 0612 10/20/19 2200  Weight: 61.3 kg 61.3 kg 60.3 kg    Examination: General exam: Appears calm and comfortable  Respiratory system: Diminished breath sounds. Respiratory effort normal.  On trach collar Cardiovascular system: S1 & S2 heard, RRR. No pedal edema. Gastrointestinal system: Abdomen is nondistended, soft and nontender. Normal bowel sounds heard. Central nervous system: Alert and oriented. Non focal exam. Speech clear  Extremities: Symmetric in appearance bilaterally  Skin: No rashes, lesions or ulcers on exposed skin  Psychiatry: Judgement and insight appear stable. Mood & affect appropriate.   Data Reviewed: I have personally reviewed following labs and imaging studies  CBC: Recent Labs  Lab 10/17/19 0547 10/20/19 0356 10/21/19 0157 10/22/19 0053  WBC 10.3 13.7* 13.3* 13.4*  NEUTROABS 7.1 11.6*  --   --   HGB 11.9* 12.0 9.6* 9.6*  HCT 39.7 40.8 31.9* 32.5*  MCV 96.8 96.0 93.3 95.0  PLT 257 286 229 614   Basic Metabolic Panel: Recent Labs  Lab 10/17/19 0547 10/20/19 0356 10/21/19 0157 10/22/19 0053  NA 142 139 140 140  K 4.5 4.1 4.7 4.6  CL 105 103 107 108  CO2 26 25 21* 23  GLUCOSE 80 120* 153* 112*  BUN 30* 17 29* 38*  CREATININE 1.03* 1.12* 1.34* 1.27*  CALCIUM 9.1 9.1 8.9 9.1   GFR: Estimated Creatinine Clearance: 32.5 mL/min (A) (by C-G formula based on SCr of 1.27 mg/dL (H)). Liver Function Tests: Recent Labs  Lab 10/20/19 0356   AST 22  ALT 20  ALKPHOS 90  BILITOT 0.7  PROT 7.1  ALBUMIN 3.2*   No results for input(s): LIPASE, AMYLASE in the last 168 hours. No results for input(s): AMMONIA in the last 168 hours. Coagulation Profile: No results for input(s): INR, PROTIME in the last 168 hours. Cardiac Enzymes: No results for input(s): CKTOTAL, CKMB, CKMBINDEX, TROPONINI in the last 168 hours. BNP (last 3 results) No results for input(s): PROBNP in the last 8760 hours. HbA1C: No results for input(s): HGBA1C in the last 72 hours. CBG: Recent Labs  Lab 10/21/19 2016 10/21/19 2328  GLUCAP 106* 217*   Lipid Profile: No results for input(s): CHOL, HDL, LDLCALC, TRIG, CHOLHDL, LDLDIRECT in the last 72 hours. Thyroid Function Tests: No results for input(s): TSH, T4TOTAL, FREET4, T3FREE, THYROIDAB in the last 72 hours. Anemia Panel: No results  for input(s): VITAMINB12, FOLATE, FERRITIN, TIBC, IRON, RETICCTPCT in the last 72 hours. Sepsis Labs: Recent Labs  Lab 10/20/19 1024  PROCALCITON <0.10    Recent Results (from the past 240 hour(s))  SARS Coronavirus 2 by RT PCR (hospital order, performed in [facility] hospital lab) Nasopharyngeal Nasopharyngeal Swab     Status: None   Collection Time: 10/20/19  4:01 AM   Specimen: Nasopharyngeal Swab  Result Value Ref Range Status   SARS Coronavirus 2 NEGATIVE NEGATIVE Final    Comment: (NOTE) SARS-CoV-2 target nucleic acids are NOT DETECTED.  The SARS-CoV-2 RNA is generally detectable in upper and lower respiratory specimens during the acute phase of infection. The lowest concentration of SARS-CoV-2 viral copies this assay can detect is 250 copies / mL. A negative result does not preclude SARS-CoV-2 infection and should not be used as the sole basis for treatment or other patient management decisions.  A negative result may occur with improper specimen collection / handling, submission of specimen other than nasopharyngeal swab, presence of viral  mutation(s) within the areas targeted by this assay, and inadequate number of viral copies (<250 copies / mL). A negative result must be combined with clinical observations, patient history, and epidemiological information.  Fact Sheet for Patients:   StrictlyIdeas.no  Fact Sheet for Healthcare Providers: BankingDealers.co.za  This test is not yet approved or  cleared by the [place] FDA and has been authorized for detection and/or diagnosis of SARS-CoV-2 by FDA under an Emergency Use Authorization (EUA).  This EUA will remain in effect (meaning this test can be used) for the duration of the COVID-19 declaration under Section 564(b)(1) of the Act, 21 U.S.C. section 360bbb-3(b)(1), unless the authorization is terminated or revoked sooner.  Performed at [facility], [place] [address] [name], [place] [address]   MRSA PCR Screening     Status: None   Collection Time: 10/20/19 10:24 AM   Specimen: Nasal Mucosa; Nasopharyngeal  Result Value Ref Range Status   MRSA by PCR NEGATIVE NEGATIVE Final    Comment:        The GeneXpert MRSA Assay (FDA approved for NASAL specimens only), is one component of a comprehensive MRSA colonization surveillance program. It is not intended to diagnose MRSA infection nor to guide or monitor treatment for MRSA infections. Performed at [facility], [place] [address] [place], [place] [address]       Radiology Studies: No results found.    Scheduled Meds: . acetylcysteine  2 mL Nebulization QID  . albuterol  2.5 mg Nebulization Q6H  . apixaban  5 mg Oral BID  . atorvastatin  80 mg Oral Daily  . feeding supplement (ENSURE ENLIVE)  237 mL Oral BID BM  . guaiFENesin-dextromethorphan  10 mL Oral Q6H  . metoprolol tartrate  25 mg Oral BID  . predniSONE  40 mg Oral Q breakfast   Continuous Infusions: . albuterol Stopped (10/20/19 0903)  . ceFEPime (MAXIPIME) IV 2 g (10/23/19 0919)       LOS: 2 days      Time spent: 25 minutes   Dessa Phi, DO [facility] 10/23/2019, 10:51 AM   Available via Epic secure chat 7am-7pm After these hours, please refer to coverage provider listed on [email or web address]

## 2019-10-24 LAB — BASIC METABOLIC PANEL
Anion gap: 8 (ref 5–15)
BUN: 33 mg/dL — ABNORMAL HIGH (ref 8–23)
CO2: 21 mmol/L — ABNORMAL LOW (ref 22–32)
Calcium: 8.7 mg/dL — ABNORMAL LOW (ref 8.9–10.3)
Chloride: 113 mmol/L — ABNORMAL HIGH (ref 98–111)
Creatinine, Ser: 1.05 mg/dL — ABNORMAL HIGH (ref 0.44–1.00)
GFR calc Af Amer: >60 mL/min (ref >60)
GFR calc non Af Amer: 54 mL/min — ABNORMAL LOW (ref >60)
Glucose, Bld: 89 mg/dL (ref 70–99)
Potassium: 4.8 mmol/L (ref 3.5–5.1)
Sodium: 142 mmol/L (ref 135–145)

## 2019-10-24 LAB — CBC
HCT: 37.8 % (ref 36.0–46.0)
Hemoglobin: 11.1 g/dL — ABNORMAL LOW (ref 12.0–15.0)
MCH: 28.5 pg (ref 26.0–34.0)
MCHC: 29.4 g/dL — ABNORMAL LOW (ref 30.0–36.0)
MCV: 96.9 fL (ref 80.0–100.0)
Platelets: 224 10*3/uL (ref 150–400)
RBC: 3.9 MIL/uL (ref 3.87–5.11)
RDW: 12.8 % (ref 11.5–15.5)
WBC: 10.9 10*3/uL — ABNORMAL HIGH (ref 4.0–10.5)
nRBC: 0 % (ref 0.0–0.2)

## 2019-10-24 LAB — GLUCOSE, CAPILLARY
Glucose-Capillary: 64 mg/dL — ABNORMAL LOW (ref 70–99)
Glucose-Capillary: 100 mg/dL — ABNORMAL HIGH (ref 70–99)
Glucose-Capillary: 77 mg/dL (ref 70–99)

## 2019-10-24 NOTE — Progress Notes (Signed)
Physical Therapy Treatment Patient Details Name: Katelyn [name] MRN: [id number] DOB: 23-Aug-1950 Today's Date: 10/24/2019    History of Present Illness Katelyn [name] is a 69 y.o. female with medical history significant of laryngeal cancer s/p laryngectomy, tracheostomy, gallstone pancreatitis, history of right hip fracture, hyperlipidemia, hypertension, right lower extremity DVT, history of Klebsiella septic shock, history of other nonhemorrhagic CVA with right-sided weakness who was discharged 10/18/19 after a 12-day hospitalization due to respiratory failure secondary to COPD exacerbation, bilateral pneumonia and aspiration pneumonia.  She returns to the emergency department after complaining of exacerbation of dyspnea twice overnight requiring each time to be seen by EMS.     PT Comments    Patient received sitting up in chair, willing to work with therapies. Husband present and very involved in session, attempted to direct care and even nudged PT out of the way to adjust patient's trach collar- was educated that staff is fully trained and only staff is to manage/adjust equipment unless specifically trained to by clinical staff. Able to progress gait distance today, but she continues to be easily fatigued. Continues to have significant issues with gait deviations including strong drift to the right unless someone was in front of her helping to steer RW.  SpO2 drop to 87% on room air so did use 6LPM per trach collar today for activity. Patient had some concerns about the 3 hours of PT a day in CIR- PT notified CIR coordinator so that this can be further discussed. Left sitting up in chair with all needs met, husband present and RN aware of patient status.    Follow Up Recommendations  CIR;Other (comment) (if they decline CIR, she will require HHPT, 24/7A and WC as well)     Equipment Recommendations  3in1 (PT);Other (comment);Rolling walker with 5" wheels    Recommendations for Other  Services       Precautions / Restrictions Precautions Precautions: Fall Precaution Comments: trach Restrictions Weight Bearing Restrictions: No    Mobility  Bed Mobility               General bed mobility comments: sitting in chair  Transfers Overall transfer level: Needs assistance Equipment used: Rolling walker (2 wheeled) Transfers: Sit to/from Stand Sit to Stand: Min assist         General transfer comment: Min A for power up and stabilizing with RW, assistance for RW manuevering and turning; max cues for safety, sequencing, and hand placement  Ambulation/Gait Ambulation/Gait assistance: Mod assist Gait Distance (Feet): 16 Feet (19f x2) Assistive device: Rolling walker (2 wheeled) Gait Pattern/deviations: Step-through pattern;Trunk flexed;Drifts right/left;Narrow base of support Gait velocity: decreased   General Gait Details: ModA for steering RW on first attempt, strong drift to R and had to ask husband to bring up chair for her to rest; on second gait distance, PT guarded patient and husband stepped in and steered RW back to chair, continued to require ModA for RW management overall   Stairs             Wheelchair Mobility    Modified Rankin (Stroke Patients Only)       Balance Overall balance assessment: Needs assistance Sitting-balance support: Feet unsupported;No upper extremity supported Sitting balance-Leahy Scale: Fair Sitting balance - Comments: sitting in recliner on arrival   Standing balance support: Bilateral upper extremity supported Standing balance-Leahy Scale: Poor Standing balance comment: reliant on RW for support, unsteady steps when walking and hard drift right  Cognition Arousal/Alertness: Awake/alert Behavior During Therapy: WFL for tasks assessed/performed Overall Cognitive Status: Within Functional Limits for tasks assessed                                 General  Comments: non verbal but appropriate responses and answers by mouthing words. Appears [facility]      Exercises      General Comments General comments (skin integrity, edema, etc.): on 6LPM per trach collar as she did desat to high 80s at rest on room air today      Pertinent Vitals/Pain Pain Assessment: No/denies pain    Home Living                      Prior Function            PT Goals (current goals can now be found in the care plan section) Acute Rehab PT Goals Patient Stated Goal: go home, be able to breathe better PT Goal Formulation: With patient/family Time For Goal Achievement: 11/04/19 Potential to Achieve Goals: Good Progress towards PT goals: Progressing toward goals    Frequency    Min 4X/week      PT Plan Current plan remains appropriate    Co-evaluation              AM-PAC PT "6 Clicks" Mobility   Outcome Measure  Help needed turning from your back to your side while in a flat bed without using bedrails?: A Little Help needed moving from lying on your back to sitting on the side of a flat bed without using bedrails?: A Little Help needed moving to and from a bed to a chair (including a wheelchair)?: A Little Help needed standing up from a chair using your arms (e.g., wheelchair or bedside chair)?: A Little Help needed to walk in hospital room?: A Lot Help needed climbing 3-5 steps with a railing? : Total 6 Click Score: 15    End of Session Equipment Utilized During Treatment: Gait belt Activity Tolerance: Patient tolerated treatment well Patient left: in chair;with call bell/phone within reach;with family/visitor present Nurse Communication: Mobility status PT Visit Diagnosis: Muscle weakness (generalized) (M62.81);Unsteadiness on feet (R26.81)     Time: 1110-1135 PT Time Calculation (min) (ACUTE ONLY): 25 min  Charges:  $Gait Training: 8-22 mins $Self Care/Home Management: 8-22                     Windell Norfolk, DPT, PN1    Supplemental Physical Therapist [place]    Pager [phone] Acute Rehab Office [phone]

## 2019-10-24 NOTE — PMR Pre-admission (Signed)
PMR Admission Coordinator Pre-Admission Assessment  Patient: Katelyn [name] is an 69 y.o., female MRN: [id number] DOB: [birth year] Height: _0  (149.9 cm) Weight: 60.3 kg  IInsurance Information HMO:     PPO:      PCP:      IPA:      80/20:  yes     OTHER:  PRIMARY: Medicare a and b      Policy#:[id number]      Subscriber: pt Benefits:  Phone #: online     Name:  Eff. Date: 08/25/1991     Deduct: $1481      Out of Pocket Max: none      Life Max: none CIR: 100%      SNF: 20 full days Outpatient: 80%     Co-Pay: 205 Home Health: 100%      Co-Pay: none DME: 80%     Co-Pay: 20%   The "Data Collection Information Summary" for patients in Inpatient Rehabilitation Facilities with attached "Privacy Act [place] Records" was provided and verbally reviewed with: Patient  Emergency Contact Information Contact Information    Name Relation Home Work [place], [place] Granddaughter   [phone]   Wells,[name] Daughter   [phone]   Armandina Stammer Daughter   [phone]      Current Medical History  Patient Admitting Diagnosis: Debility s/p PNA and COPD exacerbation History of Present Illness: Katelyn Hamiltonis a 69 y.o.femalewith medical history significant oflaryngealcancers/p laryngectomy, tracheostomy, gallstone pancreatitis,history of right hip fracture, hyperlipidemia, hypertension, right lower extremity DVT, history of Klebsiella septic shock, history of other nonhemorrhagic CVA with right-sided weakness who was discharged8/24/21after a 12-day hospitalization due to respiratory failure secondary to COPD exacerbation, bilateral pneumonia and aspiration pneumonia. She returned to the emergency department after complaining of exacerbation of dyspnea twice overnight requiring each time to be seen by EMS. Imaging revealed right-sided pneumonia. Patient was started on vancomycin, cefepime. She was also started on Solu-Medrol for suspected COPD  exacerbation.   Patient's medical record from [facility] has been reviewed by the rehabilitation admission coordinator and physician.  Past Medical History  Past Medical History:  Diagnosis Date  . Acute gallstone pancreatitis 01/08/2014  . AKI (acute kidney injury) ([place]) 01/08/2014  . Anemia   . Blood transfusion    11'15 last admission  . Cancer ([place])   . Cholecystitis   . H/O tracheostomy   . Hip fracture ([place])   . Hypercholesteremia   . Hypertension   . Right leg DVT ([place]) 05/08/2014  . Septic shock - Kelbsiella 01/09/2014  . Shortness of breath   . Stroke [facility])    '13- right side weakness, ambulates with cane    Family History   family history includes Breast cancer in her maternal aunt; Diabetes Mellitus I in her father and sister.  Prior Rehab/Hospitalizations Has the patient had prior rehab or hospitalizations prior to admission? Yes  Has the patient had major surgery during 100 days prior to admission? Yes   Current Medications  Current Facility-Administered Medications:  .  acetaminophen (TYLENOL) tablet 650 mg, 650 mg, Oral, Q6H PRN **OR** acetaminophen (TYLENOL) suppository 650 mg, 650 mg, Rectal, Q6H PRN, Reubin Milan, MD .  albuterol (PROVENTIL) (2.5 MG/3ML) 0.083% nebulizer solution 2.5 mg, 2.5 mg, Nebulization, Q4H PRN, Reubin Milan, MD .  albuterol (PROVENTIL) (2.5 MG/3ML) 0.083% nebulizer solution 2.5 mg, 2.5 mg, Nebulization, BID, Dessa Phi, DO, 2.5 mg at 10/24/19 0743 .  albuterol (PROVENTIL,VENTOLIN) solution continuous neb, 10 mg/hr,  Nebulization, Continuous, Cardama, Grayce Sessions, MD, Stopped at 10/20/19 [phone] .  ALPRAZolam Duanne Moron) tablet 0.5 mg, 0.5 mg, Oral, TID PRN, Dessa Phi, DO, 0.5 mg at 10/22/19 0849 .  apixaban (ELIQUIS) tablet 5 mg, 5 mg, Oral, BID, Dessa Phi, DO, 5 mg at 10/24/19 1019 .  atorvastatin (LIPITOR) tablet 80 mg, 80 mg, Oral, Daily, Dessa Phi, DO, 80 mg at 10/24/19 1019 .   ceFEPIme (MAXIPIME) 2 g in sodium chloride 0.9 % 100 mL IVPB, 2 g, Intravenous, Q12H, Reubin Milan, MD, Last Rate: 200 mL/hr at 10/23/19 2249, 2 g at 10/23/19 2249 .  feeding supplement (ENSURE ENLIVE) (ENSURE ENLIVE) liquid 237 mL, 237 mL, Oral, BID BM, Dessa Phi, DO, 237 mL at 10/22/19 0850 .  guaiFENesin-dextromethorphan (ROBITUSSIN DM) 100-10 MG/5ML syrup 10 mL, 10 mL, Oral, Q6H, Choi, Jennifer, DO, 10 mL at 10/24/19 1018 .  insulin aspart (novoLOG) injection 0-6 Units, 0-6 Units, Subcutaneous, TID WC, Dessa Phi, DO .  metoprolol tartrate (LOPRESSOR) tablet 25 mg, 25 mg, Oral, BID, Opyd, Ilene Qua, MD, 25 mg at 10/24/19 1019 .  ondansetron (ZOFRAN) tablet 4 mg, 4 mg, Oral, Q6H PRN **OR** ondansetron (ZOFRAN) injection 4 mg, 4 mg, Intravenous, Q6H PRN, Reubin Milan, MD  Patients Current Diet:  Diet Order            DIET DYS 3 Room service appropriate? Yes; Fluid consistency: Thin  Diet effective now                 Precautions / Restrictions Precautions Precautions: Fall Precaution Comments: trach Restrictions Weight Bearing Restrictions: No   Has the patient had 2 or more falls or a fall with injury in the past year? Yes  Prior Activity Level    Prior Functional Level Self Care: Did the patient need help bathing, dressing, using the toilet or eating? Independent  Indoor Mobility: Did the patient need assistance with walking from room to room (with or without device)? Independent  Stairs: Did the patient need assistance with internal or external stairs (with or without device)? Independent  Functional Cognition: Did the patient need help planning regular tasks such as shopping or remembering to take medications? [name] / [place] Devices/Equipment: Nebulizer, Environmental consultant (specify type) (4 wheeled walker) Home Equipment: Shower seat, Environmental consultant - 4 wheels, Cane - single point, Toilet riser, Bedside commode  Prior  Device Use: Indicate devices/aids used by the patient prior to current illness, exacerbation or injury? None of the above  Current Functional Level Cognition  Overall Cognitive Status: Within Functional Limits for tasks assessed Difficult to assess due to: Tracheostomy Orientation Level: Oriented X4 General Comments: non verbal but appropriate responses and answers by mouthing words. Appears WFL    Extremity Assessment (includes Sensation/Coordination)  Upper Extremity Assessment: Defer to OT evaluation  Lower Extremity Assessment: Generalized weakness, RLE deficits/detail RLE Deficits / Details: RLE weakness secondary to breaking R femur in 3 places 1-2 years ago    ADLs  Overall ADL's : Needs assistance/impaired Eating/Feeding: Independent, Sitting Grooming: Min guard, Standing Upper Body Bathing: Set up, Sitting Lower Body Bathing: Minimal assistance, Sit to/from stand Upper Body Dressing : Set up, Sitting Lower Body Dressing: Minimal assistance, Sit to/from stand Toilet Transfer: Minimal assistance, Stand-pivot, BSC, RW Toilet Transfer Details (indicate cue type and reason): Min A for transfer, assistance and cueing needed to turn with RW and maintain balance initially Toileting- Clothing Manipulation and Hygiene: Minimal assistance, Sit to/from stand General ADL Comments: Pt  with decreased endurance, strength, dynamic standing balance impacting ability to complete ADLs/mobility without hands on assist. Pt reports feeling better today than on arrival     Mobility  General bed mobility comments: sitting in chair on OT arrival    Transfers  Overall transfer level: Needs assistance Equipment used: Rolling walker (2 wheeled) Transfers: Sit to/from Stand, Stand Pivot Transfers Sit to Stand: Min assist Stand pivot transfers: Min assist General transfer comment: Min A for power up and stabilizing with RW, assistance for [employer] and turning    Ambulation / Gait / Stairs /  Wheelchair Mobility  Ambulation/Gait Ambulation/Gait assistance: Mod assist, +2 safety/equipment Gait Distance (Feet): 8 Feet Assistive device: Rolling walker (2 wheeled) Gait Pattern/deviations: Step-to pattern, Trunk flexed General Gait Details: pt required modA to steer RW and cueing. +2 for equipment. needed to stop and sit in chair due to weakness/fatigue Gait velocity: decreased    Posture / Balance Dynamic Sitting Balance Sitting balance - Comments: sitting in recliner on arrival Balance Overall balance assessment: Needs assistance Sitting-balance support: Feet unsupported, No upper extremity supported Sitting balance-Leahy Scale: Fair Sitting balance - Comments: sitting in recliner on arrival Standing balance support: Bilateral upper extremity supported Standing balance-Leahy Scale: Poor Standing balance comment: reliant on RW for support, unsteady steps when walking    Special needs/care consideration Skin laryngectomee stoma , Special service needs Pt. has a laryngectomy stoma. 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)   Previous Home Environment (from acute therapy documentation) Living Arrangements: Spouse/significant other Available Help at Discharge: Family, Available PRN/intermittently Type of Home: House Home Layout: One level Home Access: Ramped entrance Bathroom Shower/Tub: Multimedia programmer: Associate Professor Accessibility: Yes [place]: No Additional Comments: Family frequently checking in on pt, fiance retired and many family members can physically assist  Discharge Living Setting Plans for Discharge Living Setting: Patient's home Type of Home at Discharge: House Discharge Home Layout: One level Discharge Home Access: [place] entrance Discharge Bathroom Shower/Tub: Walk-in shower Discharge Bathroom Toilet: Standard Discharge [place] Accessibility: Yes How Accessible: Accessible via walker Does the  patient have any problems obtaining your medications?: No  Social/Family/Support Systems Patient Roles: Partner Contact Information: [phone] Anticipated Caregiver: [name] (fiance) Anticipated Caregiver's Contact Information: [phone] Ability/Limitations of Caregiver:  (none ) Caregiver Availability: 24/7 Discharge Plan Discussed with Primary Caregiver: Yes Is Caregiver In Agreement with Plan?: Yes Does Caregiver/Family have Issues with Lodging/Transportation while Pt is in Rehab?: Yes  Goals Patient/Family Goal for Rehab: PT/OT Mod I  Expected length of stay: 5-7 days Pt/Family Agrees to Admission and willing to participate: Yes Program Orientation Provided & Reviewed with Pt/Caregiver Including Roles  & Responsibilities: Yes  Decrease burden of Care through IP rehab admission: Specialzed equipment needs, Diet advancement, Decrease number of caregivers and Patient/family education  Possible need for SNF placement upon discharge: not anticipated  Patient Condition: I have reviewed medical records from [facility], spoken with CM, and patient and family member. I met with patient at the bedside for inpatient rehabilitation assessment.  Patient will benefit from ongoing PT and OT, can actively participate in 3 hours of therapy a day 5 days of the week, and can make measurable gains during the admission.  Patient will also benefit from the coordinated team approach during an Inpatient Acute Rehabilitation admission.  The patient will receive intensive therapy as well as Rehabilitation physician, nursing, social worker, and care management interventions.  Due to safety, disease management, medication administration,  pain management and patient education the patient requires 24 hour a day rehabilitation nursing.  The patient is currently mod A-Min A with mobility and basic ADLs.  Discharge setting and therapy post discharge at home with home health is anticipated.  Patient  has agreed to participate in the Acute Inpatient Rehabilitation Program and will admit today.  Preadmission Screen Completed By:  Genella Mech, 10/24/2019 12:02 PM ______________________________________________________________________   Discussed status with Dr. Dagoberto Ligas on 8/3//2021at 930  and received approval for admission today.  Admission Coordinator:  Genella Mech, CCC-SLP, time 10:23/Date 10/25/2019   Assessment/Plan: Diagnosis: 1. Does the need for close, 24 hr/day Medical supervision in concert with the patient's rehab needs make it unreasonable for this patient to be served in a less intensive setting? Yes 2. Co-Morbidities requiring supervision/potential complications: trach, laryngectomy; pneumonia on Cefipime IV, B/L pneumonia, previous CVA with R hemi 3. Due to bowel management, safety, skin/wound care, disease management, medication administration, pain management and patient education, does the patient require 24 hr/day rehab nursing? Yes 4. Does the patient require coordinated care of a physician, rehab nurse, PT, OT, and SLP to address physical and functional deficits in the context of the above medical diagnosis(es)? Yes Addressing deficits in the following areas: balance, endurance, locomotion, strength, transferring, bathing, dressing, feeding, grooming and toileting 5. Can the patient actively participate in an intensive therapy program of at least 3 hrs of therapy 5 days a week? Yes 6. The potential for patient to make measurable gains while on inpatient rehab is good 7. Anticipated functional outcomes upon discharge from inpatient rehab: modified independent PT, modified independent OT, n/a SLP 8. Estimated rehab length of stay to reach the above functional goals is: 5-7 days 9. Anticipated discharge destination: Home 10. Overall Rehab/Functional Prognosis: good   MD Signature:

## 2019-10-24 NOTE — Progress Notes (Signed)
Pt refusing IV abx and for nurse to flush IV at this time.

## 2019-10-24 NOTE — Progress Notes (Signed)
Inpatient Rehab Admissions Coordinator:   I have met with this patient to discuss CIR admit. Pt. Is currently refusing because she thinks that she cannot do 3 hours of therapy a day. Discussed the fact that she would have breaks and therapy is spaced out through the day, but Pt. Is insistent that she can't do it. Per DO, Pt.'s daughter and granddaughter want to talk to her before making a final decision. I cannot offer Pt. A CIR bed today if she changes her mind but I will check with her tomorrow morning if I have a bed available.   Clemens Catholic, [place], [place] Admissions Coordinator  [phone] ([place]) [phone] (office)

## 2019-10-24 NOTE — Plan of Care (Signed)

## 2019-10-24 NOTE — Progress Notes (Signed)
[facility] ([facility]) Community Based Palliative Care       This patient has been referred to our palliative care services in the community.  [facility] will continue to follow for any discharge planning needs and to coordinate continuation of palliative care.   If you have questions or need assistance, please call [phone] or contact the hospital Liaison listed on AMION.     Thank you for the opportunity to participate in this patient's care.     Chrislyn King, BSN, RN [facility] Hospital Liaison   [phone]  

## 2019-10-24 NOTE — Progress Notes (Signed)
PROGRESS NOTE    Katelyn [name]  QIW:[id number] DOB: 05-14-1950 DOA: 10/20/2019 PCP: Aretta Nip, MD     Brief Narrative:  Marshal Hamiltonis a 69 y.o.femalewith medical history significant oflaryngeal cancer s/p laryngectomy, tracheostomy, gallstone pancreatitis, history of right hip fracture, hyperlipidemia, hypertension, right lower extremity DVT, history of Klebsiella septic shock, history of other nonhemorrhagic CVA with right-sided weakness who was discharged 10/18/19 after a 12-day hospitalization due to respiratory failure secondary to COPD exacerbation, bilateral pneumonia and aspiration pneumonia. She returns to the emergency department after complaining of exacerbation of dyspnea twice overnight requiring each time to be seen by EMS. She declined to come to the hospital the first time, but subsequently her symptoms worsen again and EMS convinced the second time to return to the hospital. EMS described that the patient was satting 90% on room air, but did not improve with supplemental oxygen. The patient was vigorously suctioned in the emergency department which for the use significant relief of her symptoms. Imaging in the emergency department revealed right-sided pneumonia.  Patient was started on vancomycin, cefepime.  She was also started on Solu-Medrol for suspected COPD exacerbation.  New events last 24 hours / Subjective: Work with physical therapy this morning, oxygen saturation dropped to 87% on room air and required trach collar oxygen.    Assessment & Plan:   Principal Problem:   Acute respiratory failure with hypoxia (HCC) Active Problems:   Hypercholesteremia   Essential hypertension   CKD (chronic kidney disease) stage 3, GFR 30-59 ml/min (HCC)   Diastolic dysfunction   Cancer of hypopharynx (HCC)   Generalized weakness   COPD exacerbation (HCC)   Acute hypoxemic respiratory failure (HCC)   Acute hypoxemic respiratory failure secondary to  HCAP -MRSA PCR negative.  Stop vancomycin -Procalcitonin negative -Covid negative -Continue empiric cefepime -Continue to suction as needed -Trach care -Continue to wean oxygen as able  COPD exacerbation -Completed prednisone -Continue breathing treatment  Hyperlipidemia -Continue atorvastatin  Essential hypertension -Continue metoprolol  CKD stage IIIa -Baseline creatinine around 1.1-1.3 -Stable  History of DVT -Eliquis   DVT prophylaxis:  apixaban (ELIQUIS) tablet 5 mg  Code Status: Full Family Communication: None at bedside, discussed with granddaughter over the phone Disposition Plan:   Status is: Inpatient  Remains inpatient appropriate because:Unsafe d/c plan   Dispo: The patient is from: Home              Anticipated d/c is to: CIR              Anticipated d/c date is: 1 day              Patient currently is medically stable to d/c.  Patient initially declined CIR due to hesitation regarding 3 hours of therapy per day requirement.  Discussed with family and they will talk further with the patient.  Not sure that home health would be the best option for patient with her recent hospitalization and readmission within 2 days.  Discussed with CIR coordinator and she will touch base with patient again tomorrow.   Consultants:   None  Procedures:   None  Antimicrobials:  Anti-infectives (From admission, onward)   Start     Dose/Rate Route Frequency Ordered Stop   10/21/19 1000  vancomycin (VANCOCIN) IVPB 1000 mg/200 mL premix  Status:  Discontinued        1,000 mg 200 mL/hr over 60 Minutes Intravenous Every 24 hours 10/20/19 0653 10/20/19 1406   10/20/19 2200  ceFEPIme (MAXIPIME) 2 g  in sodium chloride 0.9 % 100 mL IVPB        2 g 200 mL/hr over 30 Minutes Intravenous Every 12 hours 10/20/19 0632 10/25/19 2159   10/20/19 0615  vancomycin (VANCOCIN) IVPB 1000 mg/200 mL premix        1,000 mg 200 mL/hr over 60 Minutes Intravenous  Once 10/20/19 0603  10/20/19 0747   10/20/19 0600  amoxicillin-clavulanate (AUGMENTIN) 875-125 MG per tablet 1 tablet  Status:  Discontinued        1 tablet Oral  Once 10/20/19 0548 10/20/19 0553   10/20/19 0600  ceFEPIme (MAXIPIME) 2 g in sodium chloride 0.9 % 100 mL IVPB        2 g 200 mL/hr over 30 Minutes Intravenous  Once 10/20/19 0554 10/20/19 0716       Objective: Vitals:   10/24/19 0350 10/24/19 0743 10/24/19 0806 10/24/19 1117  BP: (!) 182/86  (!) 151/59   Pulse: 72  62 64  Resp: 18  20 15   Temp: 97.8 F (36.6 C)  97.6 F (36.4 C)   TempSrc: Oral  Oral   SpO2: 98% 100% 98% 99%  Weight:      Height:       No intake or output data in the 24 hours ending 10/24/19 1303 Filed Weights   10/20/19 0357 10/20/19 0612 10/20/19 2200  Weight: 61.3 kg 61.3 kg 60.3 kg    Examination: General exam: Appears calm and comfortable  Respiratory system: Clear to auscultation. Respiratory effort normal. + Trach collar in place Cardiovascular system: S1 & S2 heard, RRR. No pedal edema. Gastrointestinal system: Abdomen is nondistended, soft and nontender. Normal bowel sounds heard. Central nervous system: Alert and oriented. Non focal exam. Speech clear  Extremities: Symmetric in appearance bilaterally  Skin: No rashes, lesions or ulcers on exposed skin  Psychiatry: Judgement and insight appear stable. Mood & affect appropriate.   Data Reviewed: I have personally reviewed following labs and imaging studies  CBC: Recent Labs  Lab 10/20/19 0356 10/21/19 0157 10/22/19 0053 10/24/19 0038  WBC 13.7* 13.3* 13.4* 10.9*  NEUTROABS 11.6*  --   --   --   HGB 12.0 9.6* 9.6* 11.1*  HCT 40.8 31.9* 32.5* 37.8  MCV 96.0 93.3 95.0 96.9  PLT 286 229 237 573   Basic Metabolic Panel: Recent Labs  Lab 10/20/19 0356 10/21/19 0157 10/22/19 0053 10/24/19 0304  NA 139 140 140 142  K 4.1 4.7 4.6 4.8  CL 103 107 108 113*  CO2 25 21* 23 21*  GLUCOSE 120* 153* 112* 89  BUN 17 29* 38* 33*  CREATININE 1.12*  1.34* 1.27* 1.05*  CALCIUM 9.1 8.9 9.1 8.7*   GFR: Estimated Creatinine Clearance: 39.4 mL/min (A) (by C-G formula based on SCr of 1.05 mg/dL (H)). Liver Function Tests: Recent Labs  Lab 10/20/19 0356  AST 22  ALT 20  ALKPHOS 90  BILITOT 0.7  PROT 7.1  ALBUMIN 3.2*   No results for input(s): LIPASE, AMYLASE in the last 168 hours. No results for input(s): AMMONIA in the last 168 hours. Coagulation Profile: No results for input(s): INR, PROTIME in the last 168 hours. Cardiac Enzymes: No results for input(s): CKTOTAL, CKMB, CKMBINDEX, TROPONINI in the last 168 hours. BNP (last 3 results) No results for input(s): PROBNP in the last 8760 hours. HbA1C: No results for input(s): HGBA1C in the last 72 hours. CBG: Recent Labs  Lab 10/21/19 2328 10/23/19 1659 10/23/19 2141 10/24/19 1011 10/24/19 1241  GLUCAP 217*  128* 100* 64* 100*   Lipid Profile: No results for input(s): CHOL, HDL, LDLCALC, TRIG, CHOLHDL, LDLDIRECT in the last 72 hours. Thyroid Function Tests: No results for input(s): TSH, T4TOTAL, FREET4, T3FREE, THYROIDAB in the last 72 hours. Anemia Panel: No results for input(s): VITAMINB12, FOLATE, FERRITIN, TIBC, IRON, RETICCTPCT in the last 72 hours. Sepsis Labs: Recent Labs  Lab 10/20/19 1024  PROCALCITON <0.10    Recent Results (from the past 240 hour(s))  SARS Coronavirus 2 by RT PCR (hospital order, performed in [facility] hospital lab) Nasopharyngeal Nasopharyngeal Swab     Status: None   Collection Time: 10/20/19  4:01 AM   Specimen: Nasopharyngeal Swab  Result Value Ref Range Status   SARS Coronavirus 2 NEGATIVE NEGATIVE Final    Comment: (NOTE) SARS-CoV-2 target nucleic acids are NOT DETECTED.  The SARS-CoV-2 RNA is generally detectable in upper and lower respiratory specimens during the acute phase of infection. The lowest concentration of SARS-CoV-2 viral copies this assay can detect is 250 copies / mL. A negative result does not preclude  SARS-CoV-2 infection and should not be used as the sole basis for treatment or other patient management decisions.  A negative result may occur with improper specimen collection / handling, submission of specimen other than nasopharyngeal swab, presence of viral mutation(s) within the areas targeted by this assay, and inadequate number of viral copies (<250 copies / mL). A negative result must be combined with clinical observations, patient history, and epidemiological information.  Fact Sheet for Patients:   StrictlyIdeas.no  Fact Sheet for Healthcare Providers: BankingDealers.co.za  This test is not yet approved or  cleared by the [place] FDA and has been authorized for detection and/or diagnosis of SARS-CoV-2 by FDA under an Emergency Use Authorization (EUA).  This EUA will remain in effect (meaning this test can be used) for the duration of the COVID-19 declaration under Section 564(b)(1) of the Act, 21 U.S.C. section 360bbb-3(b)(1), unless the authorization is terminated or revoked sooner.  Performed at [facility], [place] [address], [place], [place] [address]   MRSA PCR Screening     Status: None   Collection Time: 10/20/19 10:24 AM   Specimen: Nasal Mucosa; Nasopharyngeal  Result Value Ref Range Status   MRSA by PCR NEGATIVE NEGATIVE Final    Comment:        The GeneXpert MRSA Assay (FDA approved for NASAL specimens only), is one component of a comprehensive MRSA colonization surveillance program. It is not intended to diagnose MRSA infection nor to guide or monitor treatment for MRSA infections. Performed at [facility], [place] [address] [place], [place] [address]       Radiology Studies: No results found.    Scheduled Meds: . albuterol  2.5 mg Nebulization BID  . apixaban  5 mg Oral BID  . atorvastatin  80 mg Oral Daily  . feeding supplement (ENSURE ENLIVE)  237 mL Oral BID BM  .  guaiFENesin-dextromethorphan  10 mL Oral Q6H  . metoprolol tartrate  25 mg Oral BID   Continuous Infusions: . albuterol Stopped (10/20/19 0903)  . ceFEPime (MAXIPIME) IV 2 g (10/23/19 2249)     LOS: 3 days      Time spent: 35 minutes   Dessa Phi, DO [facility] 10/24/2019, 1:03 PM   Available via Epic secure chat 7am-7pm After these hours, please refer to coverage provider listed on [email or web address]

## 2019-10-25 ENCOUNTER — Inpatient Hospital Stay (HOSPITAL_COMMUNITY)
Admission: RE | Admit: 2019-10-25 | Discharge: 2019-11-05 | DRG: 945 | Disposition: A | Discharge status: Home Health | Payer: Medicare Other | Source: Intra-hospital | Attending: Physical Medicine & Rehabilitation | Admitting: Physical Medicine & Rehabilitation

## 2019-10-25 ENCOUNTER — Other Ambulatory Visit: Payer: Self-pay

## 2019-10-25 DIAGNOSIS — D649 Anemia, unspecified: Secondary | ICD-10-CM | POA: Diagnosis present

## 2019-10-25 DIAGNOSIS — R195 Other fecal abnormalities: Secondary | ICD-10-CM

## 2019-10-25 DIAGNOSIS — R2681 Unsteadiness on feet: Secondary | ICD-10-CM | POA: Diagnosis present

## 2019-10-25 DIAGNOSIS — Z833 Family history of diabetes mellitus: Secondary | ICD-10-CM | POA: Diagnosis not present

## 2019-10-25 DIAGNOSIS — R0603 Acute respiratory distress: Secondary | ICD-10-CM | POA: Diagnosis not present

## 2019-10-25 DIAGNOSIS — M545 Low back pain: Secondary | ICD-10-CM | POA: Diagnosis present

## 2019-10-25 DIAGNOSIS — R21 Rash and other nonspecific skin eruption: Secondary | ICD-10-CM | POA: Diagnosis present

## 2019-10-25 DIAGNOSIS — R5381 Other malaise: Secondary | ICD-10-CM | POA: Diagnosis present

## 2019-10-25 DIAGNOSIS — R7989 Other specified abnormal findings of blood chemistry: Secondary | ICD-10-CM | POA: Diagnosis present

## 2019-10-25 DIAGNOSIS — Z86718 Personal history of other venous thrombosis and embolism: Secondary | ICD-10-CM

## 2019-10-25 DIAGNOSIS — Y95 Nosocomial condition: Secondary | ICD-10-CM | POA: Diagnosis present

## 2019-10-25 DIAGNOSIS — Y838 Other surgical procedures as the cause of abnormal reaction of the patient, or of later complication, without mention of misadventure at the time of the procedure: Secondary | ICD-10-CM | POA: Diagnosis not present

## 2019-10-25 DIAGNOSIS — Z90710 Acquired absence of both cervix and uterus: Secondary | ICD-10-CM

## 2019-10-25 DIAGNOSIS — G4700 Insomnia, unspecified: Secondary | ICD-10-CM | POA: Diagnosis present

## 2019-10-25 DIAGNOSIS — J9601 Acute respiratory failure with hypoxia: Secondary | ICD-10-CM | POA: Diagnosis not present

## 2019-10-25 DIAGNOSIS — Z9981 Dependence on supplemental oxygen: Secondary | ICD-10-CM

## 2019-10-25 DIAGNOSIS — R159 Full incontinence of feces: Secondary | ICD-10-CM | POA: Diagnosis not present

## 2019-10-25 DIAGNOSIS — Z79899 Other long term (current) drug therapy: Secondary | ICD-10-CM

## 2019-10-25 DIAGNOSIS — K5900 Constipation, unspecified: Secondary | ICD-10-CM | POA: Diagnosis present

## 2019-10-25 DIAGNOSIS — J9503 Malfunction of tracheostomy stoma: Secondary | ICD-10-CM | POA: Diagnosis not present

## 2019-10-25 DIAGNOSIS — F5104 Psychophysiologic insomnia: Secondary | ICD-10-CM | POA: Diagnosis present

## 2019-10-25 DIAGNOSIS — X58XXXA Exposure to other specified factors, initial encounter: Secondary | ICD-10-CM | POA: Diagnosis not present

## 2019-10-25 DIAGNOSIS — J189 Pneumonia, unspecified organism: Secondary | ICD-10-CM | POA: Diagnosis present

## 2019-10-25 DIAGNOSIS — J449 Chronic obstructive pulmonary disease, unspecified: Secondary | ICD-10-CM

## 2019-10-25 DIAGNOSIS — Z87891 Personal history of nicotine dependence: Secondary | ICD-10-CM

## 2019-10-25 DIAGNOSIS — I69351 Hemiplegia and hemiparesis following cerebral infarction affecting right dominant side: Secondary | ICD-10-CM | POA: Diagnosis not present

## 2019-10-25 DIAGNOSIS — Z7901 Long term (current) use of anticoagulants: Secondary | ICD-10-CM

## 2019-10-25 DIAGNOSIS — R0902 Hypoxemia: Secondary | ICD-10-CM | POA: Diagnosis present

## 2019-10-25 DIAGNOSIS — Z8521 Personal history of malignant neoplasm of larynx: Secondary | ICD-10-CM | POA: Diagnosis not present

## 2019-10-25 DIAGNOSIS — R0989 Other specified symptoms and signs involving the circulatory and respiratory systems: Secondary | ICD-10-CM

## 2019-10-25 DIAGNOSIS — E7800 Pure hypercholesterolemia, unspecified: Secondary | ICD-10-CM | POA: Diagnosis present

## 2019-10-25 DIAGNOSIS — R197 Diarrhea, unspecified: Secondary | ICD-10-CM | POA: Diagnosis not present

## 2019-10-25 DIAGNOSIS — G479 Sleep disorder, unspecified: Secondary | ICD-10-CM

## 2019-10-25 DIAGNOSIS — Z803 Family history of malignant neoplasm of breast: Secondary | ICD-10-CM | POA: Diagnosis not present

## 2019-10-25 DIAGNOSIS — J440 Chronic obstructive pulmonary disease with acute lower respiratory infection: Secondary | ICD-10-CM | POA: Diagnosis present

## 2019-10-25 DIAGNOSIS — I10 Essential (primary) hypertension: Secondary | ICD-10-CM | POA: Diagnosis present

## 2019-10-25 DIAGNOSIS — Z23 Encounter for immunization: Secondary | ICD-10-CM | POA: Diagnosis not present

## 2019-10-25 DIAGNOSIS — M25551 Pain in right hip: Secondary | ICD-10-CM | POA: Diagnosis present

## 2019-10-25 MED ORDER — TRAZODONE HCL 50 MG PO TABS
25.0000 mg | ORAL_TABLET | Freq: Every evening | ORAL | Status: DC | PRN
  Administered 2019-10-25 – 2019-10-29 (×3): 50 mg via ORAL
  Filled 2019-10-25 (×4): qty 1

## 2019-10-25 MED ORDER — ALPRAZOLAM 0.5 MG PO TABS: 0.5000 mg | ORAL_TABLET | Freq: Three times a day (TID) | ORAL | Status: DC | PRN

## 2019-10-25 MED ORDER — NYSTATIN 100000 UNIT/GM EX CREA
1.0000 "application " | TOPICAL_CREAM | Freq: Two times a day (BID) | CUTANEOUS | Status: DC
  Administered 2019-10-26 – 2019-11-05 (×20): 1 via TOPICAL
  Filled 2019-10-25 (×2): qty 15

## 2019-10-25 MED ORDER — ALBUTEROL SULFATE (2.5 MG/3ML) 0.083% IN NEBU: 2.5000 mg | INHALATION_SOLUTION | Freq: Two times a day (BID) | RESPIRATORY_TRACT | Status: DC

## 2019-10-25 MED ORDER — ACETAMINOPHEN 325 MG PO TABS
325.0000 mg | ORAL_TABLET | ORAL | Status: DC | PRN
  Administered 2019-10-28: 650 mg via ORAL
  Filled 2019-10-25: qty 2

## 2019-10-25 MED ORDER — BISACODYL 10 MG RE SUPP: 10.0000 mg | Freq: Every day | RECTAL | Status: DC | PRN

## 2019-10-25 MED ORDER — METOPROLOL TARTRATE 25 MG PO TABS
25.0000 mg | ORAL_TABLET | Freq: Two times a day (BID) | ORAL | Status: DC
  Administered 2019-10-25 – 2019-11-04 (×15): 25 mg via ORAL
  Filled 2019-10-25 (×22): qty 1

## 2019-10-25 MED ORDER — APIXABAN 5 MG PO TABS
5.0000 mg | ORAL_TABLET | Freq: Two times a day (BID) | ORAL | Status: DC
  Administered 2019-10-25 – 2019-11-05 (×22): 5 mg via ORAL
  Filled 2019-10-25 (×22): qty 1

## 2019-10-25 MED ORDER — ONDANSETRON HCL 4 MG/2ML IJ SOLN: 4.0000 mg | Freq: Four times a day (QID) | INTRAMUSCULAR | Status: DC | PRN

## 2019-10-25 MED ORDER — POLYETHYLENE GLYCOL 3350 17 G PO PACK
17.0000 g | PACK | Freq: Every day | ORAL | Status: DC | PRN
  Administered 2019-11-01: 17 g via ORAL
  Filled 2019-10-25: qty 1

## 2019-10-25 MED ORDER — GUAIFENESIN-DM 100-10 MG/5ML PO SYRP
10.0000 mL | ORAL_SOLUTION | Freq: Four times a day (QID) | ORAL | Status: DC
  Administered 2019-10-25 – 2019-10-31 (×21): 10 mL via ORAL
  Filled 2019-10-25 (×21): qty 10

## 2019-10-25 MED ORDER — ALBUTEROL (5 MG/ML) CONTINUOUS INHALATION SOLN
10.0000 mg/h | INHALATION_SOLUTION | RESPIRATORY_TRACT | Status: DC
  Filled 2019-10-25: qty 20

## 2019-10-25 MED ORDER — ONDANSETRON HCL 4 MG PO TABS: 4.0000 mg | ORAL_TABLET | Freq: Four times a day (QID) | ORAL | Status: DC | PRN

## 2019-10-25 MED ORDER — FLEET ENEMA 7-19 GM/118ML RE ENEM: 1.0000 | ENEMA | Freq: Once | RECTAL | Status: DC | PRN

## 2019-10-25 MED ORDER — ALUM & MAG HYDROXIDE-SIMETH 200-200-20 MG/5ML PO SUSP: 30.0000 mL | ORAL | Status: DC | PRN

## 2019-10-25 MED ORDER — ENSURE ENLIVE PO LIQD
237.0000 mL | Freq: Two times a day (BID) | ORAL | Status: DC
  Administered 2019-10-26 – 2019-10-27 (×3): 237 mL via ORAL

## 2019-10-25 MED ORDER — ALBUTEROL SULFATE (2.5 MG/3ML) 0.083% IN NEBU
2.5000 mg | INHALATION_SOLUTION | RESPIRATORY_TRACT | Status: DC | PRN
  Filled 2019-10-25: qty 3

## 2019-10-25 MED ORDER — DIPHENHYDRAMINE HCL 12.5 MG/5ML PO ELIX: 12.5000 mg | ORAL_SOLUTION | Freq: Four times a day (QID) | ORAL | Status: DC | PRN

## 2019-10-25 MED ORDER — ATORVASTATIN CALCIUM 80 MG PO TABS
80.0000 mg | ORAL_TABLET | Freq: Every day | ORAL | Status: DC
  Administered 2019-10-26 – 2019-11-04 (×10): 80 mg via ORAL
  Filled 2019-10-25 (×10): qty 1

## 2019-10-25 NOTE — Progress Notes (Signed)
Patient admitted to 4w01 via bed transport. Alert and responsive, denies pain or discomfort. Nonverbal but able to communicate via writing and mouthing. Patient educated on calling for assistance and use of call bell. Bed low call bell with in reach.

## 2019-10-25 NOTE — Progress Notes (Signed)
Inpatient Rehab Admissions Coordinator:   I met with patient at bedside to notify her that I have a bed for her on CIR. She is agreeable to come. Will bring Pt. This afternoon.   Clemens Catholic, [place], [place] Admissions Coordinator  [phone] ([place]) [phone] (office)

## 2019-10-25 NOTE — Progress Notes (Signed)
Inpatient Rehabilitation Medication Review by a Pharmacist  A complete drug regimen review was completed for this patient to identify any potential clinically significant medication issues.  Clinically significant medication issues were identified: Yes   Type of Medication Issue Identified Description of Issue Urgent (address now) Non-Urgent (address on AM team rounds) Plan Plan Accepted by Provider? (Yes / No / Pending AM Rounds)  Drug Interaction(s) (clinically significant)       Duplicate Therapy       Allergy       No Medication Administration End Date       Incorrect Dose       Additional Drug Therapy Needed       Other  DuoNeb was listed on pt's discharge summary; albuterol was ordered on rehab admission Non urgent CIR team to review on AM rounds Pending AM rounds    Name of provider notified for urgent issues identified: N/A  For non-urgent medication issues to be resolved on team rounds tomorrow morning a [facility] Secure Chat Handoff was sent to: N/A; med review completed after 5 PM  Time spent performing this drug regimen review (minutes):  [place], PharmD, BCPS, [facility] Clinical Pharmacist 10/25/2019 5:18 PM

## 2019-10-25 NOTE — Discharge Summary (Signed)
Physician Discharge Summary  Katelyn [name] TDV:[id number] DOB: 1950/06/20 DOA: 10/20/2019  PCP: Aretta Nip, MD  Admit date: 10/20/2019 Discharge date: 10/25/2019  Admitted From: Home Disposition:  CIR   Recommendations for Outpatient Follow-up:  1. Follow up with PCP in 1 week  Discharge Condition: Stable CODE STATUS: Full  Diet recommendation:  Diet Orders (From admission, onward)    Start     Ordered   10/21/19 1242  DIET DYS 3 Room service appropriate? Yes; Fluid consistency: Thin  Diet effective now       Question Answer Comment  Room service appropriate? Yes   Fluid consistency: Thin      10/21/19 1241         Brief/Interim Summary: Katelyn Hamiltonis a 69 y.o.femalewith medical history significant oflaryngealcancers/p laryngectomy, tracheostomy, gallstone pancreatitis,history of right hip fracture, hyperlipidemia, hypertension, right lower extremity DVT, history of Klebsiella septic shock, history of other nonhemorrhagic CVA with right-sided weakness who was discharged8/24/21after a 12-day hospitalization due to respiratory failure secondary to COPD exacerbation, bilateral pneumonia and aspiration pneumonia. She returns to the emergency department after complaining of exacerbation of dyspnea twice overnight requiring each time to be seen by EMS. She declined to come to the hospital the first time, but subsequently her symptoms worsen again and EMS convinced the second time to return to the hospital. EMS described that the patient was satting 90% on room air, but did not improve with supplemental oxygen. The patient was vigorously suctioned in the emergency department which for the use significant relief of her symptoms.Imaging in the emergency department revealed right-sided pneumonia. Patient was started on vancomycin, cefepime. She was also started on Solu-Medrol for suspected COPD exacerbation.  Vancomycin was discontinued as MRSA PCR was negative.   She continued to have improvement in her respiratory status.  Physical therapy recommended CIR placement.  Discharge Diagnoses:  Principal Problem:   Acute respiratory failure with hypoxia (HCC) Active Problems:   Hypercholesteremia   Essential hypertension   CKD (chronic kidney disease) stage 3, GFR 30-59 ml/min (HCC)   Diastolic dysfunction   Cancer of hypopharynx (HCC)   Generalized weakness   COPD exacerbation (HCC)   Acute hypoxemic respiratory failure (HCC)   Acute hypoxemic respiratory failure secondary to HCAP -MRSA PCR negative. Stop vancomycin -Procalcitonin negative -Covid negative -Completed cefepime for 5 days -Continue to suction as needed -Continue to wean oxygen as able.  On room air this morning.  She did desaturate to 87% on room air with ambulation with physical therapy yesterday.  COPD exacerbation -Completed prednisone -Continue breathing treatment  Hyperlipidemia -Continue atorvastatin  Essential hypertension -Continue metoprolol  CKD stage IIIa -Baseline creatinine around 1.1-1.3 -Stable  History of DVT -Eliquis   Discharge Instructions   Allergies as of 10/25/2019   No Known Allergies     Medication List    TAKE these medications   ALPRAZolam 0.5 MG tablet Commonly known as: XANAX Take 1 tablet (0.5 mg total) by mouth 3 (three) times daily as needed for anxiety.   apixaban 5 MG Tabs tablet Commonly known as: ELIQUIS Take 1 tablet (5 mg total) by mouth 2 (two) times daily.   atorvastatin 80 MG tablet Commonly known as: LIPITOR Take 1 tablet (80 mg total) by mouth daily.   guaiFENesin-dextromethorphan 100-10 MG/5ML syrup Commonly known as: ROBITUSSIN DM Take 10 mLs by mouth every 6 (six) hours.   ipratropium-albuterol 0.5-2.5 (3) MG/3ML Soln Commonly known as: DUONEB Take 3 mLs by nebulization 2 (two) times daily.   metoprolol  tartrate 25 MG tablet Commonly known as: LOPRESSOR Take 1 tablet (25 mg total) by mouth 2  (two) times daily.   polyethylene glycol 17 g packet Commonly known as: MIRALAX / GLYCOLAX Take 17 g by mouth daily.   sodium chloride HYPERTONIC 3 % nebulizer solution Take 4 mLs by nebulization 2 (two) times daily.       Follow-up Information    Rankins, Bill Salinas, MD. Schedule an appointment as soon as possible for a visit in 1 week(s).   Specialty: Family Medicine Contact information: [place] [address] [phone]              No Known Allergies  Consultations:  None    Procedures/Studies: CT Angio Chest PE W/Cm &/Or Wo Cm  Result Date: 10/06/2019 CLINICAL DATA:  Shortness of breath.  PE suspected. EXAM: CT ANGIOGRAPHY CHEST WITH CONTRAST TECHNIQUE: Multidetector CT imaging of the chest was performed using the standard protocol during bolus administration of intravenous contrast. Multiplanar CT image reconstructions and MIPs were obtained to evaluate the vascular anatomy. CONTRAST:  14mL OMNIPAQUE IOHEXOL 350 MG/ML SOLN COMPARISON:  CT angiogram chest dated 04/12/2018 FINDINGS: Cardiovascular: The most peripheral segmental and subsegmental pulmonary arteries bilaterally are difficult to characterize due to patient breathing motion artifact, however, there is no pulmonary embolism identified within the main, lobar or central segmental pulmonary arteries bilaterally. Heart size appears stable. No pericardial effusion. No thoracic aortic aneurysm or evidence of aortic dissection. Aortic atherosclerosis. Mediastinum/Nodes: No mass or enlarged lymph nodes are seen within the mediastinum or perihilar regions. Small hiatal hernia. Esophagus is unremarkable. Tracheostomy. Lungs/Pleura: Mild bibasilar atelectasis. Lungs otherwise clear. No pleural effusion or pneumothorax. Upper Abdomen: Limited images of the upper abdomen are unremarkable. Musculoskeletal: No acute or suspicious osseous finding. Review of the MIP images confirms the above findings. IMPRESSION:  1. No acute findings. No pulmonary embolism seen, with study limitations detailed above. No pneumonia or pulmonary edema. 2. Small hiatal hernia. Aortic Atherosclerosis (ICD10-I70.0). Electronically Signed   By: Franki Cabot M.D.   On: 10/06/2019 14:50   NM Pulmonary Perfusion  Result Date: 10/09/2019 CLINICAL DATA:  Respiratory failure EXAM: NUCLEAR MEDICINE PERFUSION LUNG SCAN TECHNIQUE: Perfusion images were obtained in multiple projections after intravenous injection of radiopharmaceutical. Ventilation scans intentionally deferred if perfusion scan and chest x-ray adequate for interpretation during COVID 19 epidemic. RADIOPHARMACEUTICALS:  4.2 mCi Tc-83m MAA IV COMPARISON:  chest radiograph of 10/09/2019 FINDINGS: Right mid lung perfusion defect laterally is somewhat wedge-shaped, and is without correlate on chest radiograph. The left lung perfuses normally. IMPRESSION: Right mid lung perfusion defect laterally, for which pulmonary embolism cannot be excluded. Consider lower extremity ultrasound or if feasible, CTA of the chest. Electronically Signed   By: Abigail Miyamoto M.D.   On: 10/09/2019 12:09   DG Chest Port 1 View  Result Date: 10/20/2019 CLINICAL DATA:  Shortness of breath EXAM: PORTABLE CHEST 1 VIEW COMPARISON:  Three days ago FINDINGS: Streaky density at the lung bases. No Kerley lines, effusion, or pneumothorax. Prominent left ventricular contour but overall normal heart size for portable technique. Aortic tortuosity. Postoperative thoracic inlet. IMPRESSION: Atelectasis or infiltrate at the right base. Electronically Signed   By: Monte Fantasia M.D.   On: 10/20/2019 04:18   DG CHEST PORT 1 VIEW  Result Date: 10/17/2019 CLINICAL DATA:  Oxygen desaturation. History of COPD and laryngeal cancer. EXAM: PORTABLE CHEST 1 VIEW COMPARISON:  10/10/2019 FINDINGS: The cardiac silhouette is upper limits of normal in size.  Aortic atherosclerosis is noted. The lungs are better inflated than on the  prior study with improved aeration of the lung bases. There is mild residual linear opacity in both lung bases compatible with atelectasis. The upper lungs are clear. No edema, sizable pleural effusion, pneumothorax is identified. Surgical clips are noted in the lower neck. IMPRESSION: Improved lung aeration with mild bibasilar atelectasis. Electronically Signed   By: Logan Bores M.D.   On: 10/17/2019 11:55   DG CHEST PORT 1 VIEW  Result Date: 10/10/2019 CLINICAL DATA:  Fever EXAM: PORTABLE CHEST 1 VIEW COMPARISON:  October 09, 2018 FINDINGS: Apparent tracheostomy. Postoperative changes noted in the neck and upper thoracic regions. No evident pneumothorax there is new atelectatic change in the lung bases with ill-defined airspace opacity in these areas and questionable small right pleural effusion. Lungs elsewhere are clear. Heart size and pulmonary vascular normal. No adenopathy. There is aortic atherosclerosis. No adenopathy. No bone lesions. IMPRESSION: Atelectatic change in the lung bases with suspected developing pneumonia bilaterally in these areas. Questionable small right pleural effusion. Stable cardiac silhouette. Postoperative change in the neck and upper chest region with apparent tracheostomy. No pneumothorax. Aortic Atherosclerosis (ICD10-I70.0). Electronically Signed   By: Lowella Grip III M.D.   On: 10/10/2019 09:19   DG CHEST PORT 1 VIEW  Result Date: 10/09/2019 CLINICAL DATA:  Hypoxia prior to V/Q scan. EXAM: PORTABLE CHEST 1 VIEW COMPARISON:  10/07/2019 FINDINGS: No focal consolidation. No pleural effusion or pneumothorax. Stable cardiomegaly. No acute osseous abnormality. IMPRESSION: No active disease. Electronically Signed   By: Kathreen Devoid   On: 10/09/2019 12:57   DG Chest Portable 1 View  Result Date: 10/07/2019 CLINICAL DATA:  Shortness of breath.  Progressive hypoxia. EXAM: PORTABLE CHEST 1 VIEW COMPARISON:  One-view chest x-ray and CTA chest 10/06/2019 FINDINGS: The heart  is enlarged scratched at the heart size is normal. Progressive pulmonary vascular congestion is present. Progressive bibasilar airspace opacities are noted. No pneumothorax is present. IMPRESSION: Progressive pulmonary vascular congestion and bibasilar airspace disease, concerning for edema or infection. Electronically Signed   By: San Morelle M.D.   On: 10/07/2019 06:13   Portable chest 1 View  Result Date: 10/06/2019 CLINICAL DATA:  Worsening cough and shortness of breath EXAM: PORTABLE CHEST 1 VIEW COMPARISON:  04/12/2018 FINDINGS: Cardiac shadow is stable. Aortic calcifications are seen. Tortuosity of the aorta is seen. No focal infiltrate or sizable effusion is noted. Minimal basilar atelectasis is noted on the right. No bony abnormality is seen. IMPRESSION: Mild right basilar atelectasis. Electronically Signed   By: Inez Catalina M.D.   On: 10/06/2019 09:13   ECHOCARDIOGRAM COMPLETE  Result Date: 10/07/2019    ECHOCARDIOGRAM REPORT   Patient Name:   TAMMA BRIGANDI Date of Exam: 10/07/2019 Medical Rec #:  [id number]         Height:       59.0 in Accession #:    [id number]        Weight:       145.0 lb Date of Birth:  [birth year]         BSA:          1.609 m Patient Age:    77 years          BP:           111/83 mmHg Patient Gender: F                 HR:  102 bpm. Exam Location:  Inpatient Procedure: 2D Echo, Color Doppler and Cardiac Doppler Indications:    Z61.09 Acute diastolic (congestive) heart failure  History:        Patient has prior history of Echocardiogram examinations, most                 recent 06/05/2017. COPD; Risk Factors:Hypertension and                 Dyslipidemia.  Sonographer:    Raquel Sarna Senior RDCS Referring Phys: [phone] AMRIT ADHIKARI  Sonographer Comments: Very technically difficult due to patient body habitus. IMPRESSIONS  1. Left ventricular ejection fraction, by estimation, is 60 to 65%. The left ventricle has normal function. The left ventricle has no regional  wall motion abnormalities. There is mild left ventricular hypertrophy of the basal-septal segment. Left ventricular diastolic parameters are consistent with Grade I diastolic dysfunction (impaired relaxation).  2. Right ventricular systolic function is normal. The right ventricular size is normal. There is mildly elevated pulmonary artery systolic pressure.  3. The mitral valve is normal in structure. No evidence of mitral valve regurgitation. No evidence of mitral stenosis.  4. The aortic valve is normal in structure. Aortic valve regurgitation is mild. Mild aortic valve stenosis.  5. The inferior vena cava is normal in size with greater than 50% respiratory variability, suggesting right atrial pressure of 3 mmHg. FINDINGS  Left Ventricle: Left ventricular ejection fraction, by estimation, is 60 to 65%. The left ventricle has normal function. The left ventricle has no regional wall motion abnormalities. The left ventricular internal cavity size was normal in size. There is  mild left ventricular hypertrophy of the basal-septal segment. Left ventricular diastolic parameters are consistent with Grade I diastolic dysfunction (impaired relaxation). Indeterminate filling pressures. Right Ventricle: The right ventricular size is normal. No increase in right ventricular wall thickness. Right ventricular systolic function is normal. There is mildly elevated pulmonary artery systolic pressure. The tricuspid regurgitant velocity is 2.66  m/s, and with an assumed right atrial pressure of 8 mmHg, the estimated right ventricular systolic pressure is 81.1 mmHg. Left Atrium: Left atrial size was normal in size. Right Atrium: Right atrial size was normal in size. Pericardium: There is no evidence of pericardial effusion. Mitral Valve: The mitral valve is normal in structure. Normal mobility of the mitral valve leaflets. No evidence of mitral valve regurgitation. No evidence of mitral valve stenosis. Tricuspid Valve: The tricuspid  valve is normal in structure. Tricuspid valve regurgitation is not demonstrated. No evidence of tricuspid stenosis. Aortic Valve: The aortic valve is normal in structure. Aortic valve regurgitation is mild. Aortic regurgitation PHT measures 326 msec. Mild aortic stenosis is present. Aortic valve mean gradient measures 8.0 mmHg. Aortic valve peak gradient measures 16.6  mmHg. Aortic valve area, by VTI measures 1.37 cm. Pulmonic Valve: The pulmonic valve was normal in structure. Pulmonic valve regurgitation is not visualized. No evidence of pulmonic stenosis. Aorta: The aortic root is normal in size and structure. Venous: The inferior vena cava is normal in size with greater than 50% respiratory variability, suggesting right atrial pressure of 3 mmHg. IAS/Shunts: No atrial level shunt detected by color flow Doppler.  LEFT VENTRICLE PLAX 2D LVIDd:         3.40 cm  Diastology LVIDs:         2.20 cm  LV e' lateral:   4.35 cm/s LV PW:         1.00 cm  LV E/e' lateral: 15.3  LV IVS:        1.40 cm  LV e' medial:    8.05 cm/s LVOT diam:     1.98 cm  LV E/e' medial:  8.2 LV SV:         41 LV SV Index:   25 LVOT Area:     3.08 cm  RIGHT VENTRICLE RV S prime:     8.59 cm/s LEFT ATRIUM             Index LA diam:        3.20 cm 1.99 cm/m LA Vol (A2C):   21.6 ml 13.43 ml/m LA Vol (A4C):   28.8 ml 17.90 ml/m LA Biplane Vol: 26.4 ml 16.41 ml/m  AORTIC VALVE AV Area (Vmax):    1.15 cm AV Area (Vmean):   1.29 cm AV Area (VTI):     1.37 cm AV Vmax:           203.85 cm/s AV Vmean:          129.820 cm/s AV VTI:            0.298 m AV Peak Grad:      16.6 mmHg AV Mean Grad:      8.0 mmHg LVOT Vmax:         76.03 cm/s LVOT Vmean:        54.469 cm/s LVOT VTI:          0.133 m LVOT/AV VTI ratio: 0.45 AI PHT:            326 msec  AORTA Ao Root diam: 3.20 cm Ao Asc diam:  2.90 cm MITRAL VALVE                TRICUSPID VALVE MV Area (PHT): 3.31 cm     TR Peak grad:   28.3 mmHg MV Decel Time: 229 msec     TR Vmax:        266.00 cm/s MV E  velocity: 66.40 cm/s MV A velocity: 120.00 cm/s  SHUNTS MV E/A ratio:  0.55         Systemic VTI:  0.13 m                             Systemic Diam: 1.98 cm Dani Gobble Croitoru MD Electronically signed by Sanda Klein MD Signature Date/Time: 10/07/2019/1:39:36 PM    Final    VAS [place] LOWER EXTREMITY VENOUS (DVT)  Result Date: 10/08/2019  Lower Venous DVTStudy Limitations: Size and depth of vessels, patient inability to position. Comparison Study: Prior study 03-02-2014 was positive for DVT in RT Performing Technologist: Darlin Coco  Examination Guidelines: A complete evaluation includes B-mode imaging, spectral Doppler, color Doppler, and power Doppler as needed of all accessible portions of each vessel. Bilateral testing is considered an integral part of a complete examination. Limited examinations for reoccurring indications may be performed as noted. The reflux portion of the exam is performed with the patient in reverse Trendelenburg.  +---------+---------------+---------+-----------+-----------------+------------+ RIGHT    CompressibilityPhasicitySpontaneityProperties       Thrombus                                                                  Aging        +---------+---------------+---------+-----------+-----------------+------------+  CFV      Full           Yes      Yes                                      +---------+---------------+---------+-----------+-----------------+------------+ SFJ      Full                                                             +---------+---------------+---------+-----------+-----------------+------------+ FV Prox  Full                                                             +---------+---------------+---------+-----------+-----------------+------------+ FV Mid   None                                                Chronic      +---------+---------------+---------+-----------+-----------------+------------+ FV DistalNone                                                 Chronic      +---------+---------------+---------+-----------+-----------------+------------+ PFV      Full                                                             +---------+---------------+---------+-----------+-----------------+------------+ POP      None                               partially        Chronic                                                  re-cannalized                 +---------+---------------+---------+-----------+-----------------+------------+ PTV      Full                                                             +---------+---------------+---------+-----------+-----------------+------------+ PERO     Full                                                             +---------+---------------+---------+-----------+-----------------+------------+   +---------+---------------+---------+-----------+----------+--------------+  LEFT     CompressibilityPhasicitySpontaneityPropertiesThrombus Aging +---------+---------------+---------+-----------+----------+--------------+ CFV      Full           Yes      Yes                                 +---------+---------------+---------+-----------+----------+--------------+ SFJ      Full                                                        +---------+---------------+---------+-----------+----------+--------------+ FV Prox  Full                                                        +---------+---------------+---------+-----------+----------+--------------+ FV Mid   Full                                                        +---------+---------------+---------+-----------+----------+--------------+ FV DistalFull                                                        +---------+---------------+---------+-----------+----------+--------------+ PFV      Full                                                         +---------+---------------+---------+-----------+----------+--------------+ POP      Full           Yes      Yes                                 +---------+---------------+---------+-----------+----------+--------------+ PTV      Full                                                        +---------+---------------+---------+-----------+----------+--------------+ PERO     Full                                                        +---------+---------------+---------+-----------+----------+--------------+     Summary: RIGHT: - Findings consistent with chronic deep vein thrombosis involving the right femoral vein, and right popliteal vein. - No cystic structure found in the popliteal fossa.  LEFT: - There is no evidence of deep vein thrombosis in the lower extremity.  -  No cystic structure found in the popliteal fossa.  *See table(s) above for measurements and observations. Electronically signed by Ruta Hinds MD on 10/08/2019 at 12:05:37 PM.    Final        Discharge Exam: Vitals:   10/25/19 0818 10/25/19 0829  BP:  (!) 116/58  Pulse:  74  Resp:  (!) 24  Temp:  97.6 F (36.4 C)  SpO2: 95% 96%    General: Pt is alert, awake, not in acute distress Cardiovascular: RRR, S1/S2 +, no edema Respiratory: CTA bilaterally, no wheezing, no rhonchi, no respiratory distress, no conversational dyspnea, on room air  Abdominal: Soft, NT, ND, bowel sounds + Extremities: no edema, no cyanosis Psych: Normal mood and affect, stable judgement and insight     The results of significant diagnostics from this hospitalization (including imaging, microbiology, ancillary and laboratory) are listed below for reference.     Microbiology: Recent Results (from the past 240 hour(s))  SARS Coronavirus 2 by RT PCR (hospital order, performed in [facility] hospital lab) Nasopharyngeal Nasopharyngeal Swab     Status: None   Collection Time: 10/20/19  4:01 AM   Specimen: Nasopharyngeal Swab   Result Value Ref Range Status   SARS Coronavirus 2 NEGATIVE NEGATIVE Final    Comment: (NOTE) SARS-CoV-2 target nucleic acids are NOT DETECTED.  The SARS-CoV-2 RNA is generally detectable in upper and lower respiratory specimens during the acute phase of infection. The lowest concentration of SARS-CoV-2 viral copies this assay can detect is 250 copies / mL. A negative result does not preclude SARS-CoV-2 infection and should not be used as the sole basis for treatment or other patient management decisions.  A negative result may occur with improper specimen collection / handling, submission of specimen other than nasopharyngeal swab, presence of viral mutation(s) within the areas targeted by this assay, and inadequate number of viral copies (<250 copies / mL). A negative result must be combined with clinical observations, patient history, and epidemiological information.  Fact Sheet for Patients:   StrictlyIdeas.no  Fact Sheet for Healthcare Providers: BankingDealers.co.za  This test is not yet approved or  cleared by the [place] FDA and has been authorized for detection and/or diagnosis of SARS-CoV-2 by FDA under an Emergency Use Authorization (EUA).  This EUA will remain in effect (meaning this test can be used) for the duration of the COVID-19 declaration under Section 564(b)(1) of the Act, 21 U.S.C. section 360bbb-3(b)(1), unless the authorization is terminated or revoked sooner.  Performed at [facility], [place] [address], [place], [place] [address]   MRSA PCR Screening     Status: None   Collection Time: 10/20/19 10:24 AM   Specimen: Nasal Mucosa; Nasopharyngeal  Result Value Ref Range Status   MRSA by PCR NEGATIVE NEGATIVE Final    Comment:        The GeneXpert MRSA Assay (FDA approved for NASAL specimens only), is one component of a comprehensive MRSA colonization surveillance program. It is not intended to  diagnose MRSA infection nor to guide or monitor treatment for MRSA infections. Performed at [facility], [place] [address] [place],  [address]      Labs: BNP (last 3 results) Recent Labs    10/07/19 1440  BNP 25.3   Basic Metabolic Panel: Recent Labs  Lab 10/20/19 0356 10/21/19 0157 10/22/19 0053 10/24/19 0304  NA 139 140 140 142  K 4.1 4.7 4.6 4.8  CL 103 107 108 113*  CO2 25 21* 23  21*  GLUCOSE 120* 153* 112* 89  BUN 17 29* 38* 33*  CREATININE 1.12* 1.34* 1.27* 1.05*  CALCIUM 9.1 8.9 9.1 8.7*   Liver Function Tests: Recent Labs  Lab 10/20/19 0356  AST 22  ALT 20  ALKPHOS 90  BILITOT 0.7  PROT 7.1  ALBUMIN 3.2*   No results for input(s): LIPASE, AMYLASE in the last 168 hours. No results for input(s): AMMONIA in the last 168 hours. CBC: Recent Labs  Lab 10/20/19 0356 10/21/19 0157 10/22/19 0053 10/24/19 0038  WBC 13.7* 13.3* 13.4* 10.9*  NEUTROABS 11.6*  --   --   --   HGB 12.0 9.6* 9.6* 11.1*  HCT 40.8 31.9* 32.5* 37.8  MCV 96.0 93.3 95.0 96.9  PLT 286 229 237 224   Cardiac Enzymes: No results for input(s): CKTOTAL, CKMB, CKMBINDEX, TROPONINI in the last 168 hours. BNP: Invalid input(s): POCBNP CBG: Recent Labs  Lab 10/23/19 1659 10/23/19 2141 10/24/19 1011 10/24/19 1241 10/24/19 1707  GLUCAP 128* 100* 64* 100* 77   D-Dimer No results for input(s): DDIMER in the last 72 hours. Hgb A1c No results for input(s): HGBA1C in the last 72 hours. Lipid Profile No results for input(s): CHOL, HDL, LDLCALC, TRIG, CHOLHDL, LDLDIRECT in the last 72 hours. Thyroid function studies No results for input(s): TSH, T4TOTAL, T3FREE, THYROIDAB in the last 72 hours.  Invalid input(s): FREET3 Anemia work up No results for input(s): VITAMINB12, FOLATE, FERRITIN, TIBC, IRON, RETICCTPCT in the last 72 hours. Urinalysis    Component Value Date/Time   COLORURINE YELLOW 07/10/2017 0053   APPEARANCEUR HAZY (A) 07/10/2017 0053   LABSPEC 1.025  07/10/2017 0053   PHURINE 5.0 07/10/2017 0053   GLUCOSEU NEGATIVE 07/10/2017 0053   HGBUR NEGATIVE 07/10/2017 0053   HGBUR trace-lysed 12/30/2006 0956   BILIRUBINUR SMALL (A) 07/10/2017 0053   KETONESUR NEGATIVE 07/10/2017 0053   PROTEINUR 100 (A) 07/10/2017 0053   UROBILINOGEN 4.0 (H) 01/07/2014 2323   NITRITE NEGATIVE 07/10/2017 0053   LEUKOCYTESUR TRACE (A) 07/10/2017 0053   Sepsis Labs Invalid input(s): PROCALCITONIN,  WBC,  LACTICIDVEN Microbiology Recent Results (from the past 240 hour(s))  SARS Coronavirus 2 by RT PCR (hospital order, performed in [place] hospital lab) Nasopharyngeal Nasopharyngeal Swab     Status: None   Collection Time: 10/20/19  4:01 AM   Specimen: Nasopharyngeal Swab  Result Value Ref Range Status   SARS Coronavirus 2 NEGATIVE NEGATIVE Final    Comment: (NOTE) SARS-CoV-2 target nucleic acids are NOT DETECTED.  The SARS-CoV-2 RNA is generally detectable in upper and lower respiratory specimens during the acute phase of infection. The lowest concentration of SARS-CoV-2 viral copies this assay can detect is 250 copies / mL. A negative result does not preclude SARS-CoV-2 infection and should not be used as the sole basis for treatment or other patient management decisions.  A negative result may occur with improper specimen collection / handling, submission of specimen other than nasopharyngeal swab, presence of viral mutation(s) within the areas targeted by this assay, and inadequate number of viral copies (<250 copies / mL). A negative result must be combined with clinical observations, patient history, and epidemiological information.  Fact Sheet for Patients:   StrictlyIdeas.no  Fact Sheet for Healthcare Providers: BankingDealers.co.za  This test is not yet approved or  cleared by the [place] FDA and has been authorized for detection and/or diagnosis of SARS-CoV-2 by FDA under an Emergency  Use Authorization (EUA).  This EUA will remain in effect (meaning this test  can be used) for the duration of the COVID-19 declaration under Section 564(b)(1) of the Act, 21 U.S.C. section 360bbb-3(b)(1), unless the authorization is terminated or revoked sooner.  Performed at [facility], [place] [address] [place], [place] [address]   MRSA PCR Screening     Status: None   Collection Time: 10/20/19 10:24 AM   Specimen: Nasal Mucosa; Nasopharyngeal  Result Value Ref Range Status   MRSA by PCR NEGATIVE NEGATIVE Final    Comment:        The GeneXpert MRSA Assay (FDA approved for NASAL specimens only), is one component of a comprehensive MRSA colonization surveillance program. It is not intended to diagnose MRSA infection nor to guide or monitor treatment for MRSA infections. Performed at [facility], [place] [address] [place], [place] [address]      Patient was seen and examined on the day of discharge and was found to be in stable condition. Time coordinating discharge: 35 minutes including assessment and coordination of care, as well as examination of the patient.   SIGNED:  Dessa Phi, DO [facility] 10/25/2019, 10:35 AM

## 2019-10-25 NOTE — H&P (Signed)
Physical Medicine and Rehabilitation Admission H&P    Chief Complaint  Patient presents with  . Debility    HPI:  Katelyn [name] is a 69 year old female with HTN, CVA, DVT, with history of laryngeal CA s/p total laryngectomy with neck dissection and trach, recent admission 08/12- 08/24 for acute on chronic hypercarbic respiratory failure and BLL aspiration PNA who was discharged ot home with improvement in respiratory status and off oxygen. She was readmitted on 10/20/19 with dyspnea and hypoxia. She was started on broad spectrum antibiotics as CXR showed RLL infiltrate as well as Solu Medrol due to concerns of COPD exacerbation.  She completed 5 day course antibiotic for HCAP today and steroids d/c as respiratory status improving. Therapy initiated and she was noted to have unsteady gait, hypoxia with activity--requires to 6 L with activity.     Pt says/through mouthing words, she's off O2 at rest.  Denies any pain LBM yesterday Using Purewick here in hospital, but no issues voiding at home.  C/o soreness, rash underneath breasts Poor appetite Review of Systems  Constitutional: Negative for chills and fever.  HENT: Negative for hearing loss and tinnitus.   Eyes: Negative for blurred vision and double vision.  Respiratory: Positive for cough.   Cardiovascular: Negative for chest pain and palpitations.  Gastrointestinal: Negative for constipation, heartburn and nausea.  Genitourinary: Negative for dysuria and urgency.  Musculoskeletal: Positive for joint pain (right hip pain) and myalgias.  Neurological: Positive for weakness. Negative for dizziness and headaches.  Psychiatric/Behavioral: The patient has insomnia (chronic).   All other systems reviewed and are negative.     Past Medical History:  Diagnosis Date  . Acute gallstone pancreatitis 01/08/2014  . AKI (acute kidney injury) ([place]) 01/08/2014  . Anemia   . Blood transfusion    11'15 last admission  . Cancer ([place])    . Cholecystitis   . H/O tracheostomy   . Hip fracture ([place])   . Hypercholesteremia   . Hypertension   . Right leg DVT ([place]) 05/08/2014  . Septic shock - Kelbsiella 01/09/2014  . Shortness of breath   . Stroke [facility])    '13- right side weakness, ambulates with cane    Past Surgical History:  Procedure Laterality Date  . ABDOMINAL HYSTERECTOMY    . DIRECT LARYNGOSCOPY N/A 06/08/2017   Procedure: DIRECT LARYNGOSCOPY WITH BIOPSY;  Surgeon: Helayne Seminole, MD;  Location: [facility] OR;  Service: ENT;  Laterality: N/A;  . ENDOSCOPIC RETROGRADE CHOLANGIOPANCREATOGRAPHY (ERCP) WITH PROPOFOL N/A 01/11/2014   Procedure: ENDOSCOPIC RETROGRADE CHOLANGIOPANCREATOGRAPHY (ERCP) WITH PROPOFOL;  Surgeon: Gatha Mayer, MD;  Location: [place];  Service: Endoscopy;  Laterality: N/A;  . ERCP    . ERCP N/A 05/26/2014   Procedure: ENDOSCOPIC RETROGRADE CHOLANGIOPANCREATOGRAPHY (ERCP);  Surgeon: Gatha Mayer, MD;  Location: [facility] END[facility]COPY;  Service: Endoscopy;  Laterality: N/A;  . ESOPHAGOGASTRODUODENOSCOPY (EGD) WITH PROPOFOL N/A 12/07/2014   Procedure: ESOPHAGOGASTRODUODENOSCOPY (EGD) WITH PROPOFOL;  Surgeon: Milus Banister, MD;  Location: [facility] ENDOSCOPY;  Service: Endoscopy;  Laterality: N/A;  . EUS N/A 12/07/2014   Procedure: UPPER ENDOSCOPIC ULTRASOUND (EUS) LINEAR;  Surgeon: Milus Banister, MD;  Location: [facility] ENDOSCOPY;  Service: Endoscopy;  Laterality: N/A;  . GASTROINTESTINAL STENT REMOVAL N/A 12/07/2014   Procedure: GASTROINTESTINAL STENT REMOVAL;  Surgeon: Milus Banister, MD;  Location: [facility] ENDOSCOPY;  Service: Endoscopy;  Laterality: N/A;  pancreatic stent removal  . HIP FRACTURE SURGERY    . INTRAMEDULLARY (IM) NAIL INTERTROCHANTERIC Right 05/29/2017  Procedure: INTRAMEDULLARY (IM) NAIL INTERTROCHANTRIC FEMUR;  Surgeon: Nicholes Stairs, MD;  Location: [place];  Service: Orthopedics;  Laterality: Right;  . PERCUTANEOUS TRACHEOSTOMY N/A 05/29/2017   Procedure: PERCUTANEOUS TRACHEOSTOMY;   Surgeon: Judeth Horn, MD;  Location: [place];  Service: General;  Laterality: N/A;  . TEE WITHOUT CARDIOVERSION  03/06/2011   Procedure: TRANSESOPHAGEAL ECHOCARDIOGRAM (TEE);  Surgeon: Jolaine Artist, MD;  Location: [facility] ENDOSCOPY;  Service: Cardiovascular;  Laterality: N/A;    Family History  Problem Relation Age of Onset  . Breast cancer Maternal Aunt   . Diabetes Mellitus I Father   . Diabetes Mellitus I Sister   . Anesthesia problems Neg Hx   . Hypotension Neg Hx   . Malignant hyperthermia Neg Hx   . Pseudochol deficiency Neg Hx   . Colon cancer Neg Hx   . Esophageal cancer Neg Hx   . Pancreatic cancer Neg Hx   . Kidney disease Neg Hx   . Liver disease Neg Hx     Social History:  Married. She was independent but sedentary PTA--used walker. She reports that she quit smoking about 2 years ago. Her smoking use included cigarettes. She smoked 0.25 packs per day. She has never used smokeless tobacco. She reports that she does not drink alcohol and does not use drugs.    Allergies: No Known Allergies    Medications Prior to Admission  Medication Sig Dispense Refill  . ALPRAZolam (XANAX) 0.5 MG tablet Take 1 tablet (0.5 mg total) by mouth 3 (three) times daily as needed for anxiety. 20 tablet 0  . apixaban (ELIQUIS) 5 MG TABS tablet Take 1 tablet (5 mg total) by mouth 2 (two) times daily. 60 tablet 0  . atorvastatin (LIPITOR) 80 MG tablet Take 1 tablet (80 mg total) by mouth daily. 30 tablet 0  . guaiFENesin-dextromethorphan (ROBITUSSIN DM) 100-10 MG/5ML syrup Take 10 mLs by mouth every 6 (six) hours. 118 mL 0  . ipratropium-albuterol (DUONEB) 0.5-2.5 (3) MG/3ML SOLN Take 3 mLs by nebulization 2 (two) times daily. 360 mL 0  . metoprolol tartrate (LOPRESSOR) 25 MG tablet Take 1 tablet (25 mg total) by mouth 2 (two) times daily. 60 tablet 0  . polyethylene glycol (MIRALAX / GLYCOLAX) 17 g packet Take 17 g by mouth daily. 14 each 0  . sodium chloride HYPERTONIC 3 % nebulizer solution  Take 4 mLs by nebulization 2 (two) times daily. 750 mL 12    Drug Regimen Review  Drug regimen was reviewed and remains appropriate with no significant issues identified  Home:     Functional History:    Functional Status:  Mobility:          ADL:    Cognition: Cognition Orientation Level: Oriented X4    Physical Exam: Blood pressure 137/60, pulse (!) 58, temperature 98.2 F (36.8 C), resp. rate 17, height 4\' 11"  (1.499 m), weight 62.5 kg, SpO2 97 %. Physical Exam Vitals and nursing note reviewed. Exam conducted with a chaperone present.  Constitutional:      Appearance: She is well-developed.     Comments: Able to mouth and whisper at times.   Pt sitting up in bed- small woman with daughter at bedside, appropriate, NAD, mouthing all words  HENT:     Head: Normocephalic and atraumatic.     Comments: S/p laryngectomy with open stoma- no actual trach collar- just open trach; smile equal    Right Ear: External ear normal.     Left Ear: External ear normal.  Nose: Nose normal. No congestion.     Mouth/Throat:     Mouth: Mucous membranes are dry.     Pharynx: Oropharynx is clear. No oropharyngeal exudate.  Eyes:     General:        Right eye: No discharge.        Left eye: No discharge.     Extraocular Movements: Extraocular movements intact.  Neck:     Comments: Stoma as above Cardiovascular:     Comments: RRR_ no M/R/G Pulmonary:     Breath sounds: Examination of the right-middle field reveals decreased breath sounds. Examination of the right-lower field reveals decreased breath sounds. Decreased breath sounds present.     Comments: Decreased at bases, but not coarse, no W/R/R- adequate to  good air movement overall Abdominal:     Comments: Soft, NT, ND, (+)BS  Genitourinary:    Comments: purewick in place Musculoskeletal:     Cervical back: Normal range of motion and neck supple.     Right lower leg: No edema.     Comments: UEs- 5-/5 in biceps,  triceps; and 4+/5 in WE< grip and finger abd B/L LEs- HF 3+/5, KE 4-/5, DF and PF 4+/5  Skin:    Comments: Moisture and slightly raw underneath breasts- no actual rash seen Has hyperpigmentation rash on L side of neck and B/left knees L wrist IV- looks OK  Neurological:     Mental Status: She is alert and oriented to person, place, and time.     Comments: Intact to light touch in all 4 extremities  Psychiatric:        Mood and Affect: Mood normal.        Behavior: Behavior normal.     Results for orders placed or performed during the hospital encounter of 10/20/19 (from the past 48 hour(s))  Glucose, capillary     Status: Abnormal   Collection Time: 10/23/19  9:41 PM  Result Value Ref Range   Glucose-Capillary 100 (H) 70 - 99 mg/dL    Comment: Glucose reference range applies only to samples taken after fasting for at least 8 hours.  CBC     Status: Abnormal   Collection Time: 10/24/19 12:38 AM  Result Value Ref Range   WBC 10.9 (H) 4.0 - 10.5 K/uL   RBC 3.90 3.87 - 5.11 MIL/uL   Hemoglobin 11.1 (L) 12.0 - 15.0 g/dL   HCT 37.8 36 - 46 %   MCV 96.9 80.0 - 100.0 fL   MCH 28.5 26.0 - 34.0 pg   MCHC 29.4 (L) 30.0 - 36.0 g/dL   RDW 12.8 11.5 - 15.5 %   Platelets 224 150 - 400 K/uL   nRBC 0.0 0.0 - 0.2 %    Comment: Performed at [facility], [place] [address], [place], [place] [address]  Basic metabolic panel     Status: Abnormal   Collection Time: 10/24/19  3:04 AM  Result Value Ref Range   Sodium 142 135 - 145 mmol/L   Potassium 4.8 3.5 - 5.1 mmol/L   Chloride 113 (H) 98 - 111 mmol/L   CO2 21 (L) 22 - 32 mmol/L   Glucose, Bld 89 70 - 99 mg/dL    Comment: Glucose reference range applies only to samples taken after fasting for at least 8 hours.   BUN 33 (H) 8 - 23 mg/dL   Creatinine, Ser 1.05 (H) 0.44 - 1.00 mg/dL   Calcium 8.7 (L) 8.9 - 10.3 mg/dL  GFR calc non Af Amer 54 (L) >60 mL/min   GFR calc Af Amer >60 >60 mL/min   Anion gap 8 5 - 15    Comment: Performed at  [facility] [address], [place], [place] [address]  Glucose, capillary     Status: Abnormal   Collection Time: 10/24/19 10:11 AM  Result Value Ref Range   Glucose-Capillary 64 (L) 70 - 99 mg/dL    Comment: Glucose reference range applies only to samples taken after fasting for at least 8 hours.  Glucose, capillary     Status: Abnormal   Collection Time: 10/24/19 12:41 PM  Result Value Ref Range   Glucose-Capillary 100 (H) 70 - 99 mg/dL    Comment: Glucose reference range applies only to samples taken after fasting for at least 8 hours.  Glucose, capillary     Status: None   Collection Time: 10/24/19  5:07 PM  Result Value Ref Range   Glucose-Capillary 77 70 - 99 mg/dL    Comment: Glucose reference range applies only to samples taken after fasting for at least 8 hours.   No results found.     Medical Problem List and Plan: 1.  Debility secondary to B/L pneumonia - prolonged hospitalization with hx of laryngectomy and chronic trach  -patient may  Shower- be careful with open trach  -ELOS/Goals: 7-10 days- mod I to supervision 2.  H/o DVT/Antithrombotics: -anticoagulation:  Pharmaceutical: Other (comment)--on Eliquis  -antiplatelet therapy: will determine if needs 3. Pain Management:  4. Mood: LCSW to follow for evaluation and support.   -antipsychotic agents: N/A 5. Neuropsych: This patient is capable of making decisions on her own behalf. 6. Skin/Wound Care: Routine pressure relief measures.  7. Fluids/Electrolytes/Nutrition: Monitor I/O. Check lytes in am.  8. RLL HCAP: Cefepime completed 08/31.  9. COPD: Recent exacerbations--has intermittent tachypnea with RR in mid 20's. Nebs weaned down to bid now. Was not on O2 when saw this afternoon, but is needing sometimes with exertion 10. Pre-renal azotemia: Encourage fluid intake. Repeat labs in am.  11. Leucocytosis: Resolving 13.7-->10.9. Monitor for signs of infection.  12. Mild anemia: Recheck labs in am. Monitor for  signs of bleeding.  13. HTN: Monitor BP tid--has been labile. Continue metoprolol bid.   Bary Leriche, PA-C 10/25/2019    I have personally performed a face to face diagnostic evaluation of this patient and formulated the key components of the plan.  Additionally, I have personally reviewed laboratory data, imaging studies, as well as relevant notes and concur with the physician assistant's documentation above.   The patient's status has not changed from the original H&P.  Any changes in documentation from the acute care chart have been noted above.     Courtney Heys, MD 10/25/2019

## 2019-10-25 NOTE — Progress Notes (Signed)
PMR Admission Coordinator Pre-Admission Assessment  Patient: Katelyn [name] is an 69 y.o., female MRN: [id number] DOB: [birth year] Height: _0  (149.9 cm) Weight: 60.3 kg  IInsurance Information HMO: PPO: PCP: IPA: 80/20: yes OTHER:  PRIMARY:Medicare a and bPolicy#:[id number]: pt Benefits: Phone #: onlineName:  Eff. Date:7/1/1993Deduct: $1481Out of Pocket Max: noneLife Max: none CIR:100%SNF: 20 full days Outpatient:80%Co-Pay: 205 Home Health:100%Co-Pay: none DME:80%Co-Pay: 20%   The "Data Collection Information Summary" for patients in Inpatient Rehabilitation Facilities with attached "Privacy Act [place] Records" was provided and verbally reviewed with: Patient  Emergency Contact Information         Contact Information    Name Relation Home Work [place], [place] Granddaughter   [phone]   Wells,[name] Daughter   [phone]   Armandina Stammer Daughter   [phone]      Current Medical History  Patient Admitting Diagnosis: Debility s/p PNA and COPD exacerbation History of Present Illness: Renatta Hamiltonis a 69 y.o.femalewith medical history significant oflaryngealcancers/p laryngectomy, tracheostomy, gallstone pancreatitis,history of right hip fracture, hyperlipidemia, hypertension, right lower extremity DVT, history of Klebsiella septic shock, history of other nonhemorrhagic CVA with right-sided weakness who was discharged8/24/21after a 12-day hospitalization due to respiratory failure secondary to COPD exacerbation, bilateral pneumonia and aspiration pneumonia. She returned to the emergency department after complaining of exacerbation of dyspnea twice overnight requiring each time to be seen by EMS. Imaging revealed right-sided pneumonia. Patient was started on vancomycin, cefepime. She was also started on  Solu-Medrol for suspected COPD exacerbation. Patient's medical record from [facility] has been reviewed by the rehabilitation admission coordinator and physician.  Past Medical History      Past Medical History:  Diagnosis Date  . Acute gallstone pancreatitis 01/08/2014  . AKI (acute kidney injury) ([place]) 01/08/2014  . Anemia   . Blood transfusion    11'15 last admission  . Cancer ([place])   . Cholecystitis   . H/O tracheostomy   . Hip fracture ([place])   . Hypercholesteremia   . Hypertension   . Right leg DVT ([place]) 05/08/2014  . Septic shock - Kelbsiella 01/09/2014  . Shortness of breath   . Stroke [facility])    '13- right side weakness, ambulates with cane    Family History   family history includes Breast cancer in her maternal aunt; Diabetes Mellitus I in her father and sister.  Prior Rehab/Hospitalizations Has the patient had prior rehab or hospitalizations prior to admission? Yes  Has the patient had major surgery during 100 days prior to admission? Yes             Current Medications  Current Facility-Administered Medications:  .  acetaminophen (TYLENOL) tablet 650 mg, 650 mg, Oral, Q6H PRN **OR** acetaminophen (TYLENOL) suppository 650 mg, 650 mg, Rectal, Q6H PRN, Reubin Milan, MD .  albuterol (PROVENTIL) (2.5 MG/3ML) 0.083% nebulizer solution 2.5 mg, 2.5 mg, Nebulization, Q4H PRN, Reubin Milan, MD .  albuterol (PROVENTIL) (2.5 MG/3ML) 0.083% nebulizer solution 2.5 mg, 2.5 mg, Nebulization, BID, Dessa Phi, DO, 2.5 mg at 10/24/19 0743 .  albuterol (PROVENTIL,VENTOLIN) solution continuous neb, 10 mg/hr, Nebulization, Continuous, Cardama, Grayce Sessions, MD, Stopped at 10/20/19 [phone] .  ALPRAZolam Duanne Moron) tablet 0.5 mg, 0.5 mg, Oral, TID PRN, Dessa Phi, DO, 0.5 mg at 10/22/19 0849 .  apixaban (ELIQUIS) tablet 5 mg, 5 mg, Oral, BID, Dessa Phi, DO, 5 mg at 10/24/19 1019 .  atorvastatin (LIPITOR) tablet 80 mg, 80 mg, Oral,  Daily, Dessa Phi, DO, 80 mg at  10/24/19 1019 .  ceFEPIme (MAXIPIME) 2 g in sodium chloride 0.9 % 100 mL IVPB, 2 g, Intravenous, Q12H, Reubin Milan, MD, Last Rate: 200 mL/hr at 10/23/19 2249, 2 g at 10/23/19 2249 .  feeding supplement (ENSURE ENLIVE) (ENSURE ENLIVE) liquid 237 mL, 237 mL, Oral, BID BM, Dessa Phi, DO, 237 mL at 10/22/19 0850 .  guaiFENesin-dextromethorphan (ROBITUSSIN DM) 100-10 MG/5ML syrup 10 mL, 10 mL, Oral, Q6H, Choi, Jennifer, DO, 10 mL at 10/24/19 1018 .  insulin aspart (novoLOG) injection 0-6 Units, 0-6 Units, Subcutaneous, TID WC, Dessa Phi, DO .  metoprolol tartrate (LOPRESSOR) tablet 25 mg, 25 mg, Oral, BID, Opyd, Ilene Qua, MD, 25 mg at 10/24/19 1019 .  ondansetron (ZOFRAN) tablet 4 mg, 4 mg, Oral, Q6H PRN **OR** ondansetron (ZOFRAN) injection 4 mg, 4 mg, Intravenous, Q6H PRN, Reubin Milan, MD  Patients Current Diet:  Diet Order                  DIET DYS 3 Room service appropriate? Yes; Fluid consistency: Thin  Diet effective now                  Precautions / Restrictions Precautions Precautions: Fall Precaution Comments: trach Restrictions Weight Bearing Restrictions: No   Has the patient had 2 or more falls or a fall with injury in the past year? Yes  Prior Activity Level  Prior Functional Level Self Care: Did the patient need help bathing, dressing, using the toilet or eating? Independent  Indoor Mobility: Did the patient need assistance with walking from room to room (with or without device)? Independent  Stairs: Did the patient need assistance with internal or external stairs (with or without device)? Independent  Functional Cognition: Did the patient need help planning regular tasks such as shopping or remembering to take medications? Person / [place] Devices/Equipment: Nebulizer, Environmental consultant (specify type) (4 wheeled walker) Home Equipment: Shower seat,  Environmental consultant - 4 wheels, Cane - single point, Toilet riser, Bedside commode  Prior Device Use: Indicate devices/aids used by the patient prior to current illness, exacerbation or injury? None of the above  Current Functional Level Cognition  Overall Cognitive Status: Within Functional Limits for tasks assessed Difficult to assess due to: Tracheostomy Orientation Level: Oriented X4 General Comments: non verbal but appropriate responses and answers by mouthing words. Appears WFL    Extremity Assessment (includes Sensation/Coordination)  Upper Extremity Assessment: Defer to OT evaluation  Lower Extremity Assessment: Generalized weakness, RLE deficits/detail RLE Deficits / Details: RLE weakness secondary to breaking R femur in 3 places 1-2 years ago    ADLs  Overall ADL's : Needs assistance/impaired Eating/Feeding: Independent, Sitting Grooming: Min guard, Standing Upper Body Bathing: Set up, Sitting Lower Body Bathing: Minimal assistance, Sit to/from stand Upper Body Dressing : Set up, Sitting Lower Body Dressing: Minimal assistance, Sit to/from stand Toilet Transfer: Minimal assistance, Stand-pivot, BSC, RW Toilet Transfer Details (indicate cue type and reason): Min A for transfer, assistance and cueing needed to turn with RW and maintain balance initially Toileting- Clothing Manipulation and Hygiene: Minimal assistance, Sit to/from stand General ADL Comments: Pt with decreased endurance, strength, dynamic standing balance impacting ability to complete ADLs/mobility without hands on assist. Pt reports feeling better today than on arrival     Mobility  General bed mobility comments: sitting in chair on OT arrival    Transfers  Overall transfer level: Needs assistance Equipment used: Rolling walker (2 wheeled) Transfers: Sit to/from Stand, [employer]  Transfers Sit to Stand: Min assist Stand pivot transfers: Min assist General transfer comment: Min A for power up and  stabilizing with RW, assistance for [employer] and turning    Ambulation / Gait / Stairs / Wheelchair Mobility  Ambulation/Gait Ambulation/Gait assistance: Mod assist, +2 safety/equipment Gait Distance (Feet): 8 Feet Assistive device: Rolling walker (2 wheeled) Gait Pattern/deviations: Step-to pattern, Trunk flexed General Gait Details: pt required modA to steer RW and cueing. +2 for equipment. needed to stop and sit in chair due to weakness/fatigue Gait velocity: decreased    Posture / Balance Dynamic Sitting Balance Sitting balance - Comments: sitting in recliner on arrival Balance Overall balance assessment: Needs assistance Sitting-balance support: Feet unsupported, No upper extremity supported Sitting balance-Leahy Scale: Fair Sitting balance - Comments: sitting in recliner on arrival Standing balance support: Bilateral upper extremity supported Standing balance-Leahy Scale: Poor Standing balance comment: reliant on RW for support, unsteady steps when walking    Special needs/care consideration Skin laryngectomee stoma , Special service needs Pt. has a laryngectomy stoma. 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)   Previous Home Environment (from acute therapy documentation) Living Arrangements: Spouse/significant other Available Help at Discharge: Family, Available PRN/intermittently Type of Home: House Home Layout: One level Home Access: Ramped entrance Bathroom Shower/Tub: Multimedia programmer: Associate Professor Accessibility: Yes [place]: No Additional Comments: Family frequently checking in on pt, fiance retired and many family members can physically assist  Discharge Living Setting Plans for Discharge Living Setting: Patient's home Type of Home at Discharge: House Discharge Home Layout: One level Discharge Home Access: [place] entrance Discharge Bathroom Shower/Tub: Walk-in shower Discharge Bathroom  Toilet: Standard Discharge [place] Accessibility: Yes How Accessible: Accessible via walker Does the patient have any problems obtaining your medications?: No  Social/Family/Support Systems Patient Roles: Partner Contact Information: [phone] Anticipated Caregiver: [name] (fiance) Anticipated Caregiver's Contact Information: [phone] Ability/Limitations of Caregiver:  (none ) Caregiver Availability: 24/7 Discharge Plan Discussed with Primary Caregiver: Yes Is Caregiver In Agreement with Plan?: Yes Does Caregiver/Family have Issues with Lodging/Transportation while Pt is in Rehab?: Yes  Goals Patient/Family Goal for Rehab: PT/OT Mod I  Expected length of stay: 5-7 days Pt/Family Agrees to Admission and willing to participate: Yes Program Orientation Provided & Reviewed with Pt/Caregiver Including Roles  & Responsibilities: Yes  Decrease burden of Care through IP rehab admission: Specialzed equipment needs, Diet advancement, Decrease number of caregivers and Patient/family education  Possible need for SNF placement upon discharge: not anticipated  Patient Condition: I have reviewed medical records from [facility], spoken with CM, and patient and family member. I met with patient at the bedside for inpatient rehabilitation assessment.  Patient will benefit from ongoing PT and OT, can actively participate in 3 hours of therapy a day 5 days of the week, and can make measurable gains during the admission.  Patient will also benefit from the coordinated team approach during an Inpatient Acute Rehabilitation admission.  The patient will receive intensive therapy as well as Rehabilitation physician, nursing, social worker, and care management interventions.  Due to safety, disease management, medication administration, pain management and patient education the patient requires 24 hour a day rehabilitation nursing.  The patient is currently mod A-Min A with mobility and  basic ADLs.  Discharge setting and therapy post discharge at home with home health is anticipated.  Patient has agreed to participate in the Acute Inpatient Rehabilitation Program and will admit today.  Preadmission Screen Completed  By:  Genella Mech, 10/24/2019 12:02 PM ______________________________________________________________________   Discussed status with Dr. Dagoberto Ligas on 8/3//2021at 930  and received approval for admission today.  Admission Coordinator:  Genella Mech, CCC-SLP, time 10:23/Date 10/25/2019   Assessment/Plan: Diagnosis: 1. Does the need for close, 24 hr/day Medical supervision in concert with the patient's rehab needs make it unreasonable for this patient to be served in a less intensive setting? Yes 2. Co-Morbidities requiring supervision/potential complications: trach, laryngectomy; pneumonia on Cefipime IV, B/L pneumonia, previous CVA with R hemi 3. Due to bowel management, safety, skin/wound care, disease management, medication administration, pain management and patient education, does the patient require 24 hr/day rehab nursing? Yes 4. Does the patient require coordinated care of a physician, rehab nurse, PT, OT, and SLP to address physical and functional deficits in the context of the above medical diagnosis(es)? Yes Addressing deficits in the following areas: balance, endurance, locomotion, strength, transferring, bathing, dressing, feeding, grooming and toileting 5. Can the patient actively participate in an intensive therapy program of at least 3 hrs of therapy 5 days a week? Yes 6. The potential for patient to make measurable gains while on inpatient rehab is good 7. Anticipated functional outcomes upon discharge from inpatient rehab: modified independent PT, modified independent OT, n/a SLP 8. Estimated rehab length of stay to reach the above functional goals is: 5-7 days 9. Anticipated discharge destination: Home 10. Overall Rehab/Functional Prognosis:  good   MD Signature:

## 2019-10-26 ENCOUNTER — Inpatient Hospital Stay (HOSPITAL_COMMUNITY): Payer: Medicare Other | Admitting: Physical Therapy

## 2019-10-26 ENCOUNTER — Inpatient Hospital Stay (HOSPITAL_COMMUNITY): Payer: Medicare Other | Admitting: Occupational Therapy

## 2019-10-26 ENCOUNTER — Encounter (HOSPITAL_COMMUNITY): Payer: Self-pay | Admitting: Physical Medicine & Rehabilitation

## 2019-10-26 ENCOUNTER — Inpatient Hospital Stay (HOSPITAL_COMMUNITY): Payer: Medicare Other

## 2019-10-26 LAB — CBC WITH DIFFERENTIAL/PLATELET
Abs Immature Granulocytes: 0.06 10*3/uL (ref 0.00–0.07)
Basophils Absolute: 0 10*3/uL (ref 0.0–0.1)
Basophils Relative: 0 %
Eosinophils Absolute: 0.1 10*3/uL (ref 0.0–0.5)
Eosinophils Relative: 1 %
HCT: 34.8 % — ABNORMAL LOW (ref 36.0–46.0)
Hemoglobin: 10.3 g/dL — ABNORMAL LOW (ref 12.0–15.0)
Immature Granulocytes: 1 %
Lymphocytes Relative: 23 %
Lymphs Abs: 1.6 10*3/uL (ref 0.7–4.0)
MCH: 28.5 pg (ref 26.0–34.0)
MCHC: 29.6 g/dL — ABNORMAL LOW (ref 30.0–36.0)
MCV: 96.4 fL (ref 80.0–100.0)
Monocytes Absolute: 0.4 10*3/uL (ref 0.1–1.0)
Monocytes Relative: 6 %
Neutro Abs: 4.7 10*3/uL (ref 1.7–7.7)
Neutrophils Relative %: 69 %
Platelets: 207 10*3/uL (ref 150–400)
RBC: 3.61 MIL/uL — ABNORMAL LOW (ref 3.87–5.11)
RDW: 13.2 % (ref 11.5–15.5)
WBC: 6.9 10*3/uL (ref 4.0–10.5)
nRBC: 0 % (ref 0.0–0.2)

## 2019-10-26 LAB — COMPREHENSIVE METABOLIC PANEL
ALT: 17 U/L (ref 0–44)
AST: 17 U/L (ref 15–41)
Albumin: 2.7 g/dL — ABNORMAL LOW (ref 3.5–5.0)
Alkaline Phosphatase: 62 U/L (ref 38–126)
Anion gap: 9 (ref 5–15)
BUN: 24 mg/dL — ABNORMAL HIGH (ref 8–23)
CO2: 22 mmol/L (ref 22–32)
Calcium: 9.2 mg/dL (ref 8.9–10.3)
Chloride: 111 mmol/L (ref 98–111)
Creatinine, Ser: 0.94 mg/dL (ref 0.44–1.00)
GFR calc Af Amer: >60 mL/min (ref >60)
GFR calc non Af Amer: >60 mL/min (ref >60)
Glucose, Bld: 86 mg/dL (ref 70–99)
Potassium: 4.3 mmol/L (ref 3.5–5.1)
Sodium: 142 mmol/L (ref 135–145)
Total Bilirubin: 0.7 mg/dL (ref 0.3–1.2)
Total Protein: 5.7 g/dL — ABNORMAL LOW (ref 6.5–8.1)

## 2019-10-26 NOTE — Progress Notes (Signed)
Patient Details  Name: Monteen Toops MRN: [id number] Date of Birth: September 09, 1950  Today's Date: 10/26/2019  Hospital Problems: Principal Problem:   Debility Active Problems:   Acute respiratory failure with hypoxia [facility])  Past Medical History:  Past Medical History:  Diagnosis Date  . Acute gallstone pancreatitis 01/08/2014  . AKI (acute kidney injury) ([place]) 01/08/2014  . Anemia   . Blood transfusion    11'15 last admission  . Cancer ([place])   . Cholecystitis   . H/O tracheostomy   . Hip fracture ([place])   . Hypercholesteremia   . Hypertension   . Right leg DVT ([place]) 05/08/2014  . Septic shock - Kelbsiella 01/09/2014  . Shortness of breath   . Stroke [facility])    '13- right side weakness, ambulates with cane   Past Surgical History:  Past Surgical History:  Procedure Laterality Date  . ABDOMINAL HYSTERECTOMY    . DIRECT LARYNGOSCOPY N/A 06/08/2017   Procedure: DIRECT LARYNGOSCOPY WITH BIOPSY;  Surgeon: Helayne Seminole, MD;  Location: [facility] OR;  Service: ENT;  Laterality: N/A;  . ENDOSCOPIC RETROGRADE CHOLANGIOPANCREATOGRAPHY (ERCP) WITH PROPOFOL N/A 01/11/2014   Procedure: ENDOSCOPIC RETROGRADE CHOLANGIOPANCREATOGRAPHY (ERCP) WITH PROPOFOL;  Surgeon: Gatha Mayer, MD;  Location: [place];  Service: Endoscopy;  Laterality: N/A;  . ERCP    . ERCP N/A 05/26/2014   Procedure: ENDOSCOPIC RETROGRADE CHOLANGIOPANCREATOGRAPHY (ERCP);  Surgeon: Gatha Mayer, MD;  Location: [facility]COPY;  Service: Endoscopy;  Laterality: N/A;  . ESOPHAGOGASTRODUODENOSCOPY (EGD) WITH PROPOFOL N/A 12/07/2014   Procedure: ESOPHAGOGASTRODUODENOSCOPY (EGD) WITH PROPOFOL;  Surgeon: Milus Banister, MD;  Location: [facility] ENDOSCOPY;  Service: Endoscopy;  Laterality: N/A;  . EUS N/A 12/07/2014   Procedure: UPPER ENDOSCOPIC ULTRASOUND (EUS) LINEAR;  Surgeon: Milus Banister, MD;  Location: [facility] ENDOSCOPY;  Service: Endoscopy;  Laterality: N/A;  . GASTROINTESTINAL STENT REMOVAL N/A 12/07/2014   Procedure:  GASTROINTESTINAL STENT REMOVAL;  Surgeon: Milus Banister, MD;  Location: [facility] ENDOSCOPY;  Service: Endoscopy;  Laterality: N/A;  pancreatic stent removal  . HIP FRACTURE SURGERY    . INTRAMEDULLARY (IM) NAIL INTERTROCHANTERIC Right 05/29/2017   Procedure: INTRAMEDULLARY (IM) NAIL INTERTROCHANTRIC FEMUR;  Surgeon: Nicholes Stairs, MD;  Location: [place];  Service: Orthopedics;  Laterality: Right;  . PERCUTANEOUS TRACHEOSTOMY N/A 05/29/2017   Procedure: PERCUTANEOUS TRACHEOSTOMY;  Surgeon: Judeth Horn, MD;  Location: [place];  Service: General;  Laterality: N/A;  . TEE WITHOUT CARDIOVERSION  03/06/2011   Procedure: TRANSESOPHAGEAL ECHOCARDIOGRAM (TEE);  Surgeon: Jolaine Artist, MD;  Location[facility] ENDOSCOPY;  Service: Cardiovascular;  Laterality: N/A;   Social History:  reports that she quit smoking about 2 years ago. Her smoking use included cigarettes. She smoked 0.25 packs per day. She has never used smokeless tobacco. She reports that she does not drink alcohol and does not use drugs.  Family / Support Systems Marital Status: Widow/Widower Patient Roles: Partner, Parent Spouse/Significant Other: [name]-fiance [phone] Children: [name]-daughter [phone]  Tammy-daughter [phone]-cell Other Supports: [name]-granddaughter/ HCPOA [phone]-cell Anticipated Caregiver: [name] and other family members Ability/Limitations of Caregiver: [name] is retired and can be there to assist her, her children will also be assisting Caregiver Availability: 24/7 Family Dynamics: Close knit family all of her three children are involved and she has 17 grandchildren who come by. She has more family than she needs according to her  Social History Preferred language: English Religion: Non-Denominational Cultural Background: No issues Education: HS Read: Yes Write: Yes Employment Status: Retired Public relations account executive Issues: No issues Guardian/Conservator: None-according to  MD pt is capable of making  her own decisions while here. [name]-HCPOA wants to be included and pt is in agreement with this   Abuse/Neglect Abuse/Neglect Assessment Can Be Completed: Yes Physical Abuse: Denies Verbal Abuse: Denies Sexual Abuse: Denies Exploitation of patient/patient's resources: Denies Self-Neglect: Denies  Emotional Status Pt's affect, behavior and adjustment status: Pt is motivated to do well and recover from her pneumonia she didn't know she had. She wants to get back to her independent level where she was before being hospitalized. She is willing to work hard to improve. Recent Psychosocial Issues: other health issues-breathing main issue when having to come back into the hospital Psychiatric History: No issues-may benefit from seing neuro-psych while here for coping. She has multiple health issues she has dealt with over the years. Substance Abuse History: remote smoking quit 2 years go, wants to be able to breath  Patient / Family Perceptions, Expectations & Goals Pt/Family understanding of illness & functional limitations: Pt and daughter can explain her health issues and pneumonia. Pt did not know she had this and was surprised to find this out. She does talk with the MD rounding daily and feels she has a good understandng now. Premorbid pt/family roles/activities: Mom, grandmother, partner, friend, etc Anticipated changes in roles/activities/participation: resume Pt/family expectations/goals: Pt states: " I want to breath better and be able to do for myself."  Daughter states: " I hope she will do well here, she is one who does her own thing."  [employer]: Other (Comment) Premorbid Home Care/DME Agencies: Other (Comment) [facility] active with PT,OT,RN has rw, rollator, bsc, tub seat neb machine, ramp into home) Transportation available at discharge: children and [name] all drive her to her appointments when she goes Resource referrals recommended:  Neuropsychology  Discharge Planning Living Arrangements: Spouse/significant other Support Systems: Spouse/significant other, Children, Other relatives, Friends/neighbors Type of Residence: Private residence Insurance Resources: Chartered certified accountant Resources: Fish farm manager, Family Support Financial Screen Referred: No Living Expenses: Rent Money Management: Patient Does the patient have any problems obtaining your medications?: No Home Management: Self, [name] and children Patient/Family Preliminary Plans: Return home with [name] being there, since he is retired. Her children and gradnchildren will be coming in and out to assist if needed. Pt wants to be mod/i before going home. Discussed team evaluating today and setting goals. Team will need to read her lips or have her write her communication, so will nee dot see her lips needs clear mask when in therapy gym Care Coordinator Barriers to Discharge: Medication compliance Care Coordinator Barriers to Discharge Comments: Pt not always compliant with her meds and follow up appointments Care Coordinator Anticipated Follow Up Needs: HH/OP  Clinical Impression Pleasant female who is motivated to do well and get back home. She did not realize she had pneumonia and is still recovering from this. She has numerous family members who will assist her if needed. She required no O2 at home and has needed equipment. Will await team evaluation and work on discharge needs. Pt does need a clear mask when out in therapies so team can red her lips or carry her note pad to write her communication.  Elease Hashimoto 10/26/2019, 9:42 AM

## 2019-10-26 NOTE — Progress Notes (Signed)
[place] Individual Statement of Services  Patient Name:  Katelyn [name]  Date:  10/26/2019  Welcome to the [facility].  Our goal is to provide you with an individualized program based on your diagnosis and situation, designed to meet your specific needs.  With this comprehensive rehabilitation program, you will be expected to participate in at least 3 hours of rehabilitation therapies Monday-Friday, with modified therapy programming on the weekends.  Your rehabilitation program will include the following services:  Physical Therapy (PT), Occupational Therapy (OT), 24 hour per day rehabilitation nursing, Neuropsychology, Care Coordinator, Rehabilitation Medicine, Nutrition Services and Pharmacy Services  Weekly team conferences will be held on Wednesday to discuss your progress.  Your Inpatient Rehabilitation Care Coordinator will talk with you frequently to get your input and to update you on team discussions.  Team conferences with you and your family in attendance may also be held.  Expected length of stay:   Overall: 8-10 days anticipated outcome: supervision level  Depending on your progress and recovery, your program may change. Your Inpatient Rehabilitation Care Coordinator will coordinate services and will keep you informed of any changes. Your Inpatient Rehabilitation Care Coordinator's name and contact numbers are listed  below.  The following services may also be recommended but are not provided by the [facility]:   [place] will be made to provide these services after discharge if needed.  Arrangements include referral to agencies that provide these services.  Your insurance has been verified to be:  Medicare A & B Your primary doctor is:  Eritrea Rankin  Pertinent information will be shared with your doctor and your insurance  company.  Inpatient Rehabilitation Care Coordinator:  Ovidio Kin, [place] or Emilia Beck  Information discussed with and copy given to patient by: Elease Hashimoto, 10/26/2019, 9:45 AM

## 2019-10-26 NOTE — Progress Notes (Signed)
Inpatient Rehabilitation  Patient information reviewed and entered into eRehab system by Nalea Salce M. Jettson Crable, M.A., CCC/SLP, PPS Coordinator.  Information including medical coding, functional ability and quality indicators will be reviewed and updated through discharge.    

## 2019-10-26 NOTE — Patient Care Conference (Signed)
Inpatient RehabilitationTeam Conference and Plan of Care Update Date: 10/26/2019   Time: 11:38 AM    Patient Name: Katelyn [name]      Medical Record Number: [id number]  Date of Birth: [birth year] Sex: Female         Room/Bed: 4W01C/4W01C-01 Payor Info: Payor: MEDICARE / Plan: MEDICARE PART A AND B / Product Type: *No Product type* /    Admit Date/Time:  10/25/2019  3:28 PM  Primary Diagnosis:  [place] [facility] Problems: Principal Problem:   Debility Active Problems:   Acute respiratory failure with hypoxia [facility])    Expected Discharge Date: Expected Discharge Date: 11/05/19  Team Members Present: Physician leading conference: Dr. Leeroy Cha Care Coodinator Present: Dorien Chihuahua, RN, BSN, CRRN;Other (comment) Jacqlyn Larsen [place], SW) Nurse Present: Isla Pence, RN PT Present: Becky Sax, PT OT Present: Simonne Come, OT PPS Coordinator present : Gunnar Fusi, Novella Olive, PT     Current Status/Progress Goal Weekly Team Focus  Bowel/Bladder   pt inc of b and b  become ocnt of b and b  assess q shoift and prn   Swallow/Nutrition/ Hydration             ADL's   Min-mod assist sit > stand, Min-mod A ambulatory transfers with RW, ModA LB dressing  Supervision - Mod I  ADL retraining, activity tolerance/endurance   Mobility   bed mobility min A, transfers with/without AD mod A, gait 71ft with RW mod A +2, WC mobility 9ft supervision  supervision  functional mobility/transfers, generalized strengthening, dynamic standing balance/coordination, ambulation, family education, endurance   Communication             Safety/Cognition/ Behavioral Observations            Pain   no current complaints of pain  remain pain free  Assess q shift and prn   Skin   trach site open to air  remain free from breakdown and infection  Assess q shift and pn     Discharge Planning:  Home with boyfriend and daughter assisting. New eval today-will await team's goals   Team  Discussion: Incontinent of bowel and bladder at times. Progress impaired by fatigue but patient is motivated to return to home. Patient on target to meet rehab goals: Yes; supervision goals set  *See Care Plan and progress notes for long and short-term goals.   Revisions to Treatment Plan:   Teaching Needs: Transfers, toileting, medications, pulmonary care, etc  Current Barriers to Discharge:   Possible Resolutions to Barriers: Has supportive family and equipment from recent hospitalization [facility] palliative care referral already set up for discharge    Medical Summary Current Status: Denies pain, constipation, poor phonation, incontinence of stool and urine, BP well controlled, anemia, oxygen desaturation  Barriers to Discharge: Medical stability;Neurogenic Bowel & Bladder  Barriers to Discharge Comments: poor phonation, incontinence of stool and urine, HTN, anemia, oxygen desaturation Possible Resolutions to [employer] Focus: Monitor vitals TID, monitor labs weekly, reading lips for communication   Continued Need for Acute Rehabilitation Level of Care: The patient requires daily medical management by a physician with specialized training in physical medicine and rehabilitation for the following reasons: Direction of a multidisciplinary physical rehabilitation program to maximize functional independence : Yes Medical management of patient stability for increased activity during participation in an intensive rehabilitation regime.: Yes Analysis of laboratory values and/or radiology reports with any subsequent need for medication adjustment and/or medical intervention. : Yes   I attest that I was present,  lead the team conference, and concur with the assessment and plan of the team.   [name] 10/26/2019, 3:03 PM

## 2019-10-26 NOTE — Progress Notes (Signed)
Physical Therapy Session Note  Patient Details  Name: Katelyn [name] MRN: [id number] Date of Birth: 1950-09-11  Today's Date: 10/26/2019 PT Individual Time: 2035-5974 PT Individual Time Calculation (min): 55 min   Short Term Goals: Week 1:  PT Short Term Goal 1 (Week 1): pt will perform bed mobility with CGA overall PT Short Term Goal 2 (Week 1): Pt will transfer bed<>WC with LRAD CGA PT Short Term Goal 3 (Week 1): Pt will ambulate 60ft with LRAD CGA  Skilled Therapeutic Interventions/Progress Updates:  Pt received in bed & agreeable to tx. Supine<>sit with supervision, HOB significantly elevated & bed rails & extra time. Pt dons L shoe, requires assistance for R shoe. Sit<>stand from EOB, w/c throughout session with min assist with cuing to push up with 1UE. Pt negotiates 8 steps (3") with B rails & min assist with PT providing multimodal cuing & instruction for compensatory pattern. Gait x 25 ft with min increasing to mod assist with PT demonstrating decreased weight shift R, lateral instability & LOB, very minimal R hip & knee flexion during swing phase, and decreased heel strike BLE. Pt engaged in dynavision with task focusing on standing tolerance & balance with pt requiring min assist with 1UE support on RW & cuing to not lean on w/c with BLE; pt tolerated activity ~2  Minutes + 1 minute 40 seconds with seated rest break in between. Pt propels w/c with BUE & supervision in open hallway, min assist to negotiate doorway, x 25 ft for BUE strengthening. At end of session pt left in bed with alarm set, call bell & all needs in reach.  SpO2 >90% throughout session on room air. Pt with occasional c/o SOB & PT educates her on pursed lip breathing.    Therapy Documentation Precautions:  Precautions Precautions: Fall Precaution Comments: Trach Restrictions Weight Bearing Restrictions: No  Pain: Denies pain   Therapy/Group: Individual Therapy  Waunita Schooner 10/26/2019, 2:58 PM

## 2019-10-26 NOTE — Evaluation (Signed)
Occupational Therapy Assessment and Plan  Patient Details  Name: Trixie Maclaren MRN: [id number] Date of Birth: 05/16/1950  OT Diagnosis: muscle weakness (generalized) Rehab Potential: Rehab Potential (ACUTE ONLY): Good ELOS: 10-12 days   Today's Date: 10/26/2019 OT Individual Time: 0347-4259 OT Individual Time Calculation (min): 55 min     Hospital Problem: Principal Problem:   Debility Active Problems:   Acute respiratory failure with hypoxia ([place])   Past Medical History:  Past Medical History:  Diagnosis Date  . Acute gallstone pancreatitis 01/08/2014  . AKI (acute kidney injury) ([place]) 01/08/2014  . Anemia   . Blood transfusion    11'15 last admission  . Cancer ([place])   . Cholecystitis   . H/O tracheostomy   . Hip fracture ([place])   . Hypercholesteremia   . Hypertension   . Right leg DVT ([place]) 05/08/2014  . Septic shock - Kelbsiella 01/09/2014  . Shortness of breath   . Stroke [facility])    '13- right side weakness, ambulates with cane   Past Surgical History:  Past Surgical History:  Procedure Laterality Date  . ABDOMINAL HYSTERECTOMY    . DIRECT LARYNGOSCOPY N/A 06/08/2017   Procedure: DIRECT LARYNGOSCOPY WITH BIOPSY;  Surgeon: Helayne Seminole, MD;  Location: [facility] OR;  Service: ENT;  Laterality: N/A;  . ENDOSCOPIC RETROGRADE CHOLANGIOPANCREATOGRAPHY (ERCP) WITH PROPOFOL N/A 01/11/2014   Procedure: ENDOSCOPIC RETROGRADE CHOLANGIOPANCREATOGRAPHY (ERCP) WITH PROPOFOL;  Surgeon: Gatha Mayer, MD;  Location: [place];  Service: Endoscopy;  Laterality: N/A;  . ERCP    . ERCP N/A 05/26/2014   Procedure: ENDOSCOPIC RETROGRADE CHOLANGIOPANCREATOGRAPHY (ERCP);  Surgeon: Gatha Mayer, MD;  Location: [facility];  Service: Endoscopy;  Laterality: N/A;  . ESOPHAGOGASTRODUODENOSCOPY (EGD) WITH PROPOFOL N/A 12/07/2014   Procedure: ESOPHAGOGASTRODUODENOSCOPY (EGD) WITH PROPOFOL;  Surgeon: Milus Banister, MD;  Location: [facility] ENDOSCOPY;  Service: Endoscopy;  Laterality: N/A;  .  EUS N/A 12/07/2014   Procedure: UPPER ENDOSCOPIC ULTRASOUND (EUS) LINEAR;  Surgeon: Milus Banister, MD;  Location: [facility] ENDOSCOPY;  Service: Endoscopy;  Laterality: N/A;  . GASTROINTESTINAL STENT REMOVAL N/A 12/07/2014   Procedure: GASTROINTESTINAL STENT REMOVAL;  Surgeon: Milus Banister, MD;  Location: [facility] ENDOSCOPY;  Service: Endoscopy;  Laterality: N/A;  pancreatic stent removal  . HIP FRACTURE SURGERY    . INTRAMEDULLARY (IM) NAIL INTERTROCHANTERIC Right 05/29/2017   Procedure: INTRAMEDULLARY (IM) NAIL INTERTROCHANTRIC FEMUR;  Surgeon: Nicholes Stairs, MD;  Location: [place];  Service: Orthopedics;  Laterality: Right;  . PERCUTANEOUS TRACHEOSTOMY N/A 05/29/2017   Procedure: PERCUTANEOUS TRACHEOSTOMY;  Surgeon: Judeth Horn, MD;  Location: [place];  Service: General;  Laterality: N/A;  . TEE WITHOUT CARDIOVERSION  03/06/2011   Procedure: TRANSESOPHAGEAL ECHOCARDIOGRAM (TEE);  Surgeon: Jolaine Artist, MD;  Location: [facility];  Service: Cardiovascular;  Laterality: N/A;    Assessment & Plan Clinical Impression: Patient is a 69 y.o. year old female with HTN, CVA, DVT, with history of laryngeal CA s/p total laryngectomy with neck dissection and trach, recent admission 08/12- 08/24 for acute on chronic hypercarbic respiratory failure and BLL aspiration PNA who was discharged ot home with improvement in respiratory status and off oxygen. She was readmitted on 10/20/19 with dyspnea and hypoxia. She was started on broad spectrum antibiotics as CXR showed RLL infiltrate as well as Solu Medrol due to concerns of COPD exacerbation.  She completed 5 day course antibiotic for HCAP today and steroids d/c as respiratory status improving. Therapy initiated and she was noted to have unsteady gait, hypoxia with activity--requires  to 6 L with activity.  Patient transferred to CIR on 10/25/2019 .    Patient currently requires mod with basic self-care skills secondary to muscle weakness, decreased cardiorespiratoy  endurance and decreased oxygen support and decreased standing balance and decreased balance strategies.  Prior to hospitalization, patient could complete ADLs with modified independent .  Patient will benefit from skilled intervention to decrease level of assist with basic self-care skills prior to discharge home with care partner.  Anticipate patient will require intermittent supervision and follow up home health.  OT - End of Session Activity Tolerance: Tolerates 30+ min activity with multiple rests Endurance Deficit: Yes Endurance Deficit Description: SOB with functional mobility OT Assessment Rehab Potential (ACUTE ONLY): Good OT Patient demonstrates impairments in the following area(s): Balance;Endurance;Motor;Pain;Safety OT Basic ADL's Functional Problem(s): Grooming;Bathing;Dressing;Toileting OT Transfers Functional Problem(s): Toilet OT Additional Impairment(s): None OT Plan OT Intensity: Minimum of 1-2 x/day, 45 to 90 minutes OT Frequency: 5 out of 7 days OT Duration/Estimated Length of Stay: 10-12 days OT Treatment/Interventions: Medical illustrator training;Community reintegration;Discharge planning;Disease mangement/prevention;DME/adaptive equipment instruction;Functional mobility training;Pain management;Patient/family education;Psychosocial support;Self Care/advanced ADL retraining;Therapeutic Activities;Therapeutic Exercise;UE/LE Strength taining/ROM OT Basic Self-Care Anticipated Outcome(s): Supervision OT Toileting Anticipated Outcome(s): Supervision OT Bathroom Transfers Anticipated Outcome(s): Supervision OT Recommendation Recommendations for Other Services: Therapeutic Recreation consult Therapeutic Recreation Interventions: Other (comment) (leisure pursuits) Patient destination: Home Follow Up Recommendations: Home health OT Equipment Recommended: None recommended by OT   OT Evaluation Precautions/Restrictions  Precautions Precautions: Fall Precaution Comments:  Trach Restrictions Weight Bearing Restrictions: No General   Vital Signs   Pain Pain Assessment Pain Scale: 0-10 Pain Score: 0-No pain Home Living/Prior Functioning Home Living Family/patient expects to be discharged to:: Private residence Living Arrangements: Spouse/significant other Available Help at Discharge: Family, Available PRN/intermittently, Available 24 hours/day Type of Home: House Home Access: Ramped entrance Home Layout: One level Bathroom Shower/Tub:  (reports sponge bathes at sink) Bathroom Toilet: Handicapped height Bathroom Accessibility: Yes Additional Comments: Family frequently checking in on pt, fiance retired and many family members can physically assist  Lives With: Significant other (fiance) IADL History Homemaking Responsibilities: No Prior Function Level of Independence: Requires assistive device for independence, Independent with basic ADLs  Able to Take Stairs?: No Driving: No Vocation: Retired Comments: ambulatory with RW around home. Reports able to complete ADLs without assist Vision Baseline Vision/History: Wears glasses Wears Glasses: Reading only Patient Visual Report: No change from baseline Vision Assessment?: No apparent visual deficits Perception  Perception: Within Functional Limits Praxis Praxis: Intact Cognition Overall Cognitive Status: Within Functional Limits for tasks assessed Arousal/Alertness: Awake/alert Orientation Level: Person;Place;Situation Person: Oriented Place: Oriented Situation: Oriented Year: 2021 Month: August Day of Week: Correct Memory: Appears intact Immediate Memory Recall: Sock;Blue;Bed Memory Recall Sock: Not able to recall Memory Recall Blue: Without Cue Memory Recall Bed: Without Cue Attention: Alternating Awareness: Appears intact Problem Solving: Appears intact Safety/Judgment: Impaired Comments: poor safety awareness with RW Sensation Sensation Light Touch: Appears  Intact Proprioception: Appears Intact Coordination Fine Motor Movements are Fluid and Coordinated: Yes Finger Nose Finger Test: WFL bilaterally but slow Heel Shin Test: WFL bilaterally Motor  Motor Motor: Abnormal postural alignment and control Motor - Skilled Clinical Observations: grossly uncoordinated due to decreased balance/postural control generalized weakness, and poor endurance  Trunk/Postural Assessment  Cervical Assessment Cervical Assessment: Exceptions to [facility] (forward head) Thoracic Assessment Thoracic Assessment: Exceptions to [facility] (rounded shoulders) Lumbar Assessment Lumbar Assessment: Exceptions to [facility] (posterior pelvic tilt) Postural Control Postural Control: Deficits on evaluation  Balance Balance Balance Assessed: Yes  Static Sitting Balance Static Sitting - Balance Support: Feet supported;Bilateral upper extremity supported Static Sitting - Level of Assistance: 5: Stand by assistance Dynamic Sitting Balance Dynamic Sitting - Balance Support: Feet supported;Bilateral upper extremity supported Dynamic Sitting - Level of Assistance: 5: Stand by assistance Static Standing Balance Static Standing - Balance Support: Bilateral upper extremity supported (RW) Static Standing - Level of Assistance: 4: Min assist Dynamic Standing Balance Dynamic Standing - Balance Support: Bilateral upper extremity supported (RW) Dynamic Standing - Level of Assistance: 3: Mod assist Extremity/Trunk Assessment RUE Assessment RUE Assessment: Within Functional Limits Active Range of Motion (AROM) Comments: WFL General Strength Comments: 5-/5 in biceps, triceps; and 4+/5 in wirst, grip and finger LUE Assessment LUE Assessment: Within Functional Limits Active Range of Motion (AROM) Comments: WFL General Strength Comments: 5-/5 in biceps, triceps; and 4+/5 in wrist, grip and finger  Care Tool Care Tool Self Care Eating   Eating Assist Level: Set up assist    Oral Care  Oral care,  brush teeth, clean dentures activity did not occur: Refused      Bathing Bathing activity did not occur: Refused            Upper Body Dressing(including orthotics) Upper body dressing/undressing activity did not occur (including orthotics): Refused What is the patient wearing?: Pull over shirt   Assist Level: Minimal Assistance - Patient > 75%    Lower Body Dressing (excluding footwear) Lower body dressing activity did not occur: Refused What is the patient wearing?: Pants Assist for lower body dressing: Maximal Assistance - Patient 25 - 49%    Putting on/Taking off footwear   What is the patient wearing?: Shoes (doffed) Assist for footwear: Contact Guard/Touching assist       Care Tool Toileting Toileting activity         Care Tool Bed Mobility Roll left and right activity   Roll left and right assist level: Contact Guard/Touching assist    Sit to lying activity        Lying to sitting edge of bed activity   Lying to sitting edge of bed assist level: Minimal Assistance - Patient > 75%     Care Tool Transfers Sit to stand transfer   Sit to stand assist level: Moderate Assistance - Patient 50 - 74%    Chair/bed transfer   Chair/bed transfer assist level: Moderate Assistance - Patient 50 - 74%     Toilet transfer   Assist Level: Moderate Assistance - Patient 50 - 74%     Care Tool Cognition Expression of Ideas and Wants Expression of Ideas and Wants: Some difficulty - exhibits some difficulty with expressing needs and ideas (e.g, some words or finishing thoughts) or speech is not clear   Understanding Verbal and Non-Verbal Content Understanding Verbal and Non-Verbal Content: Understands (complex and basic) - clear comprehension without cues or repetitions   Memory/Recall Ability *first 3 days only Memory/Recall Ability *first 3 days only: Current season;Location of own room;That he or she is in a hospital/hospital unit    Refer to Care Plan for [place] 1 OT Short Term Goal 1 (Week 1): Pt will complete bathing with min assist with <2 rest breaks to demonstrate increased activity tolerance OT Short Term Goal 2 (Week 1): Pt will complete LB dressing with min assist with <1 rest break to demonstrate increased activity tolerance OT Short Term Goal 3 (Week 1): Pt will complete toileting with min assist  Recommendations for  other services: Therapeutic Recreation  Other leisure pursuits   Skilled Therapeutic Intervention OT eval completed with discussion of rehab process, OT purpose, POC, ELOS, and goals.  ADL assessment completed with focus on functional mobility and activity tolerance during mobility.  Pt received upright in w/c already dressed and declined any bathing or dressing during this session. Educated on purpose of OT, with pt agreeable to engage in bathing during future session.  Pt completed sit > stand and functional transfers with RW with mod assist to come to standing and min-mod assist ambulating with RW.  Therapist providing cues for stepping pattern and safety with RW placement, as pt with tendency to push RW too far in front.  Engaged in discussion regarding PLOF and pt goals of rehab.  Pt expressing desire to be able to do "what I can" for herself and to resume some leisure activities.  Pt reports fatigue and requested to return to bed.  Completed sit > stand and stand pivot to bed with RW with min assist.  Pt remained semi-reclined in bed with all needs in reach.   ADL ADL Lower Body Dressing: Moderate assistance Toileting: Moderate assistance Toilet Transfer: Moderate assistance Toilet Transfer Method: Ambulating (with RW) Toilet Transfer Equipment: Grab bars Mobility  Bed Mobility Bed Mobility: Rolling Right;Supine to Sit Rolling Right: Contact Guard/Touching assist Supine to Sit: Minimal Assistance - Patient > 75% Transfers Sit to Stand: Moderate Assistance - Patient 50-74% Stand to Sit:  Minimal Assistance - Patient > 75%   Discharge Criteria: Patient will be discharged from OT if patient refuses treatment 3 consecutive times without medical reason, if treatment goals not met, if there is a change in medical status, if patient makes no progress towards goals or if patient is discharged from hospital.  The above assessment, treatment plan, treatment alternatives and goals were discussed and mutually agreed upon: by patient  Simonne Come 10/26/2019, 12:16 PM

## 2019-10-26 NOTE — Evaluation (Signed)
Physical Therapy Assessment and Plan  Patient Details  Name: Katelyn [name] MRN: [id number] Date of Birth: 12/09/1950  PT Diagnosis: Abnormal posture, Abnormality of gait, Difficulty walking and Muscle weakness Rehab Potential: Good ELOS: 10-12 days   Today's Date: 10/26/2019 PT Individual Time: 0800-0910 PT Individual Time Calculation (min): 70 min    Hospital Problem: Principal Problem:   Debility Active Problems:   Acute respiratory failure with hypoxia [facility])   Past Medical History:  Past Medical History:  Diagnosis Date  . Acute gallstone pancreatitis 01/08/2014  . AKI (acute kidney injury) ([place]) 01/08/2014  . Anemia   . Blood transfusion    11'15 last admission  . Cancer ([place])   . Cholecystitis   . H/O tracheostomy   . Hip fracture ([place])   . Hypercholesteremia   . Hypertension   . Right leg DVT ([place]) 05/08/2014  . Septic shock - Kelbsiella 01/09/2014  . Shortness of breath   . Stroke [facility])    '13- right side weakness, ambulates with cane   Past Surgical History:  Past Surgical History:  Procedure Laterality Date  . ABDOMINAL HYSTERECTOMY    . DIRECT LARYNGOSCOPY N/A 06/08/2017   Procedure: DIRECT LARYNGOSCOPY WITH BIOPSY;  Surgeon: Helayne Seminole, MD;  Location: [facility] OR;  Service: ENT;  Laterality: N/A;  . ENDOSCOPIC RETROGRADE CHOLANGIOPANCREATOGRAPHY (ERCP) WITH PROPOFOL N/A 01/11/2014   Procedure: ENDOSCOPIC RETROGRADE CHOLANGIOPANCREATOGRAPHY (ERCP) WITH PROPOFOL;  Surgeon: Gatha Mayer, MD;  Location: [place];  Service: Endoscopy;  Laterality: N/A;  . ERCP    . ERCP N/A 05/26/2014   Procedure: ENDOSCOPIC RETROGRADE CHOLANGIOPANCREATOGRAPHY (ERCP);  Surgeon: Gatha Mayer, MD;  Location: [facility];  Service: Endoscopy;  Laterality: N/A;  . ESOPHAGOGASTRODUODENOSCOPY (EGD) WITH PROPOFOL N/A 12/07/2014   Procedure: ESOPHAGOGASTRODUODENOSCOPY (EGD) WITH PROPOFOL;  Surgeon: Milus Banister, MD;  Location: [facility] ENDOSCOPY;  Service: Endoscopy;   Laterality: N/A;  . EUS N/A 12/07/2014   Procedure: UPPER ENDOSCOPIC ULTRASOUND (EUS) LINEAR;  Surgeon: Milus Banister, MD;  Location: [facility] ENDOSCOPY;  Service: Endoscopy;  Laterality: N/A;  . GASTROINTESTINAL STENT REMOVAL N/A 12/07/2014   Procedure: GASTROINTESTINAL STENT REMOVAL;  Surgeon: Milus Banister, MD;  Location: [facility] ENDOSCOPY;  Service: Endoscopy;  Laterality: N/A;  pancreatic stent removal  . HIP FRACTURE SURGERY    . INTRAMEDULLARY (IM) NAIL INTERTROCHANTERIC Right 05/29/2017   Procedure: INTRAMEDULLARY (IM) NAIL INTERTROCHANTRIC FEMUR;  Surgeon: Nicholes Stairs, MD;  Location: [place];  Service: Orthopedics;  Laterality: Right;  . PERCUTANEOUS TRACHEOSTOMY N/A 05/29/2017   Procedure: PERCUTANEOUS TRACHEOSTOMY;  Surgeon: Judeth Horn, MD;  Location: [place];  Service: General;  Laterality: N/A;  . TEE WITHOUT CARDIOVERSION  03/06/2011   Procedure: TRANSESOPHAGEAL ECHOCARDIOGRAM (TEE);  Surgeon: Jolaine Artist, MD;  Location: [facility] ENDOSCOPY;  Service: Cardiovascular;  Laterality: N/A;    Assessment & Plan Clinical Impression: Patient is a 69 y.o. year old female with medical history significant of laryngeal cancer s/p laryngectomy, tracheostomy, gallstone pancreatitis, history of right hip fracture, hyperlipidemia, hypertension, right lower extremity DVT, history of Klebsiella septic shock, history of other nonhemorrhagic CVA with right-sided weakness who was discharged 10/18/19 after a 12-day hospitalization due to respiratory failure secondary to COPD exacerbation, bilateral pneumonia and aspiration pneumonia.  She returned to the emergency department after complaining of exacerbation of dyspnea twice overnight requiring each time to be seen by EMS.   Imaging revealed right-sided pneumonia.  Patient was started on vancomycin, cefepime.  She was also started on Solu-Medrol for suspected COPD exacerbation. Patient's  medical record from [facility] has been reviewed by the  rehabilitation admission coordinator and physician.  Patient currently requires mod with mobility secondary to muscle weakness, decreased cardiorespiratoy endurance and decreased standing balance, decreased postural control and decreased balance strategies.  Prior to hospitalization, patient was modified independent  with mobility and lived with Significant other (fiance) in a House home. Home access is  Ramped entrance.  Patient will benefit from skilled PT intervention to maximize safe functional mobility, minimize fall risk and decrease caregiver burden for planned discharge home with 24 hour assist.  Anticipate patient will benefit from follow up [facility] at discharge.  PT - End of Session Activity Tolerance: Tolerates 30+ min activity with multiple rests Endurance Deficit: Yes Endurance Deficit Description: pt required rest breaks throughout session PT Assessment Rehab Potential (ACUTE/IP ONLY): Good PT Barriers to Discharge: Trach;Other (comments) PT Barriers to Discharge Comments: difficulty communicating due to trach, decreased endurance, decreased balance PT Patient demonstrates impairments in the following area(s): Balance;Endurance;Motor;Safety PT Transfers Functional Problem(s): Bed Mobility;Bed to Chair;Car;Furniture PT Locomotion Functional Problem(s): Ambulation;Wheelchair Mobility;Stairs PT Plan PT Intensity: Minimum of 1-2 x/day ,45 to 90 minutes PT Frequency: 5 out of 7 days PT Duration Estimated Length of Stay: 10-12 days PT Treatment/Interventions: Ambulation/gait training;Discharge planning;Functional mobility training;Psychosocial support;Therapeutic Activities;Balance/vestibular training;Disease management/prevention;Neuromuscular re-education;Therapeutic Exercise;Wheelchair propulsion/positioning;DME/adaptive equipment instruction;Pain management;Splinting/orthotics;UE/LE Strength taining/ROM;Community reintegration;Functional electrical stimulation;Patient/family  education;Stair training;UE/LE Coordination activities PT Transfers Anticipated Outcome(s): supervision with LRAD PT Locomotion Anticipated Outcome(s): supervision with LRAD PT Recommendation Follow Up Recommendations: Home health PT Patient destination: Home Equipment Recommended: To be determined Equipment Details: has RW and rollator  PT Evaluation Precautions/Restrictions Precautions Precautions: Fall Precaution Comments: Trach Restrictions Weight Bearing Restrictions: No Home Living/Prior Functioning Home Living Living Arrangements: Significant other Available Help at Discharge: Family;Available PRN/intermittently;Available 24 hours/day Type of Home: House Home Access: Ramped entrance Home Layout: One level Bathroom Shower/Tub:  (reports sponge bathes at sink) Bathroom Toilet: Handicapped height Bathroom Accessibility: Yes Additional Comments: Family frequently checking in on pt, fiance retired and many family members can physically assist  Lives With: Significant other (fiance) Prior Function Level of Independence: Requires assistive device for independence;Independent with basic ADLs  Able to Take Stairs?: No Driving: No Vocation: Retired Comments: ambulatory with RW around home. Reports able to complete ADLs without assist Cognition Overall Cognitive Status: Within Functional Limits for tasks assessed Arousal/Alertness: Awake/alert Orientation Level: Oriented X4 Memory: Appears intact Awareness: Appears intact Problem Solving: Appears intact Safety/Judgment: Impaired Comments: poor safety awareness with RW Sensation Sensation Light Touch: Appears Intact Proprioception: Appears Intact Coordination Gross Motor Movements are Fluid and Coordinated: No Fine Motor Movements are Fluid and Coordinated: Yes Coordination and Movement Description: grossly uncoordinated due to decreased balance/postural control generalized weakness, and poor endurance Finger Nose Finger  Test: WFL bilaterally but slow Heel Shin Test: WFL bilaterally Motor  Motor Motor: Abnormal postural alignment and control Motor - Skilled Clinical Observations: grossly uncoordinated due to decreased balance/postural control generalized weakness, and poor endurance  Trunk/Postural Assessment  Cervical Assessment Cervical Assessment: Exceptions to [facility] (forward head) Thoracic Assessment Thoracic Assessment: Exceptions to [facility] (rounded shoulders) Lumbar Assessment Lumbar Assessment: Exceptions to [facility] (posterior pelvic tilt) Postural Control Postural Control: Deficits on evaluation  Balance Balance Balance Assessed: Yes Static Sitting Balance Static Sitting - Balance Support: Feet supported;Bilateral upper extremity supported Static Sitting - Level of Assistance: 5: Stand by assistance Dynamic Sitting Balance Dynamic Sitting - Balance Support: Feet supported;Bilateral upper extremity supported Dynamic Sitting - Level of Assistance: 5: Stand by  assistance Static Standing Balance Static Standing - Balance Support: Bilateral upper extremity supported (RW) Static Standing - Level of Assistance: 4: Min assist Dynamic Standing Balance Dynamic Standing - Balance Support: Bilateral upper extremity supported (RW) Dynamic Standing - Level of Assistance: 3: Mod assist Extremity Assessment  RLE Assessment RLE Assessment: Exceptions to [facility] General Strength Comments: grossly generalized to 3+/5 LLE Assessment LLE Assessment: Exceptions to [facility] General Strength Comments: grossly generalized to 4-/5 (except hip adduction 3+/5)  Care Tool Care Tool Bed Mobility Roll left and right activity   Roll left and right assist level: Contact Guard/Touching assist    Sit to lying activity        Lying to sitting edge of bed activity   Lying to sitting edge of bed assist level: Minimal Assistance - Patient > 75%     Care Tool Transfers Sit to stand transfer   Sit to stand assist level: Moderate  Assistance - Patient 50 - 74%    Chair/bed transfer   Chair/bed transfer assist level: Moderate Assistance - Patient 50 - 74%     Psychologist, clinical transfer assist level: Moderate Assistance - Patient 50 - 74%      Care Tool Locomotion Ambulation   Assist level: 2 helpers Assistive device: Walker-rolling Max distance: 42ft  Walk 10 feet activity   Assist level: 2 helpers Assistive device: Walker-rolling   Walk 50 feet with 2 turns activity Walk 50 feet with 2 turns activity did not occur: Safety/medical concerns (decreased balance/postural control, generalized weakness, poor endurance)      Walk 150 feet activity Walk 150 feet activity did not occur: Safety/medical concerns (decreased balance/postural control, generalized weakness, poor endurance)      Walk 10 feet on uneven surfaces activity Walk 10 feet on uneven surfaces activity did not occur: Safety/medical concerns (decreased balance/postural control, generalized weakness, poor endurance)      Stairs Stair activity did not occur: Safety/medical concerns (decreased balance/postural control, generalized weakness, poor endurance)        Walk up/down 1 step activity Walk up/down 1 step or curb (drop down) activity did not occur: Safety/medical concerns (decreased balance/postural control, generalized weakness, poor endurance)     Walk up/down 4 steps activity did not occuR: Safety/medical concerns (decreased balance/postural control, generalized weakness, poor endurance)  Walk up/down 4 steps activity      Walk up/down 12 steps activity Walk up/down 12 steps activity did not occur: Safety/medical concerns (decreased balance/postural control, generalized weakness, poor endurance)      Pick up small objects from floor Pick up small object from the floor (from standing position) activity did not occur: Safety/medical concerns (decreased balance/postural control, generalized weakness, poor endurance)       Wheelchair Will patient use wheelchair at discharge?: Yes Type of Wheelchair: Manual   Wheelchair assist level: Supervision/Verbal cueing Max wheelchair distance: 28ft  Wheel 50 feet with 2 turns activity   Assist Level: Moderate Assistance - Patient 50 - 74%  Wheel 150 feet activity   Assist Level: Total Assistance - Patient < 25%    Refer to Care Plan for Long Term Goals  SHORT TERM GOAL WEEK 1 PT Short Term Goal 1 (Week 1): pt will perform bed mobility with CGA overall PT Short Term Goal 2 (Week 1): Pt will transfer bed<>WC with LRAD CGA PT Short Term Goal 3 (Week 1): Pt will ambulate 68ft with LRAD CGA  Recommendations for other services:  None   Skilled Therapeutic Intervention Evaluation completed (see details above and below) with education on PT POC and goals and individual treatment initiated with focus on functional mobility/transfers, generalized strengthening, dynamic standing balance/coordination, ambulation, simulated car transfers, and improved activity tolerance. Received pt supine in bed with RN present administering medications, pt educated on PT evaluation, CIR policies, and therapy schedule and agreeable. Pt non verbal but demonstrated appropriate responses and answered by mouthing words. O2 sat 86% increasing to 89% on RA while supine in bed; pt asymptomatic. Pt donned pants in supine with mod A and transferred supine<>sitting EOB with HOB elevated and min handheld assist. Pt transferred sit<>stand with RW mod A and required max A to pull pants over hips in standing. Doffed gown and donned pull over shirt with min A. Pt transferred stand<>pivot bed<>WC without AD and mod A. Pt performed WC mobility 70f using bilateral UEs and supervision then began pulling on L rail to compensate for additional 111f [facility] of [facility]. Pt performed simulated car transfer with RW and mod A. O2 sat 90% increasing to 92% after activity and pt  denied any SOB. Therapist switched out WC for 18x16 and provided cushion for improved fit and comfort. Pt ambulated 4264fith RW mod A +2 for close WC follow. Pt demonstrated wide BOS, flexed trunk, decreased foot clearance, and poor RW safety awareness and required manual facilitation from therapist to keep RW close to pt. O2 sat 92% increasing to 94% on RA with seated rest break. Pt began coughing and requested to use suction. Returned to room and therapist assisted with suctioning. Concluded session with pt sitting in WC, needs within reach, and seatbelt alarm on. Safety plan updated.   Mobility Bed Mobility Bed Mobility: Rolling Right;Supine to Sit Rolling Right: Contact Guard/Touching assist Supine to Sit: Minimal Assistance - Patient > 75% Transfers Transfers: Sit to Stand;Stand to Sit;Stand Pivot Transfers Sit to Stand: Moderate Assistance - Patient 50-74% Stand to Sit: Minimal Assistance - Patient > 75% Stand Pivot Transfers: Moderate Assistance - Patient 50 - 74% Stand Pivot Transfer Details: Verbal cues for safe use of DME/AE;Verbal cues for technique;Verbal cues for precautions/safety;Verbal cues for sequencing Stand Pivot Transfer Details (indicate cue type and reason): verbal cues for hand placement, turning technique, and RW safety Transfer (Assistive device): Rolling walker Locomotion  Gait Ambulation: Yes Gait Assistance: 2 Helpers Gait Distance (Feet): 42 Feet Assistive device: Rolling walker Gait Assistance Details: Verbal cues for precautions/safety;Verbal cues for technique;Verbal cues for sequencing;Verbal cues for safe use of DME/AE Gait Assistance Details: verbal cues and manual faciliation from therapist for RW safety, cues for posture, and step length/foot placement Gait Gait: Yes Gait Pattern: Impaired Gait Pattern: Step-to pattern;Decreased trunk rotation;Wide base of support;Decreased stride length;Decreased step length - right;Decreased step length - left;Trunk  flexed;Poor foot clearance - left;Poor foot clearance - right Gait velocity: decreased Wheelchair Mobility Wheelchair Mobility: Yes Wheelchair Assistance: SupChartered loss adjusteroth upper extremities Wheelchair Parts Management: Needs assistance Distance: [id number] Criteria: Patient will be discharged from PT if patient refuses treatment 3 consecutive times without medical reason, if treatment goals not met, if there is a change in medical status, if patient makes no progress towards goals or if patient is discharged from hospital.  The above assessment, treatment plan, treatment alternatives and goals were discussed and mutually agreed upon: by patient  AnnaAlfonse Alpers DPT  10/26/2019, 12:07 PM

## 2019-10-26 NOTE — Progress Notes (Signed)
Patient ID: Katelyn [name], female   DOB: 29-Dec-1950, 69 y.o.   MRN: [id number]  Met with pt to inform of team conference goals supervision level and target discharge date of 9/11. Pt smiled and gave a thumbs up and will be glad to be going home. She reports her am therapies went well today.

## 2019-10-26 NOTE — Progress Notes (Signed)
[facility] PHYSICAL MEDICINE & REHABILITATION PROGRESS NOTE   Subjective/Complaints: No complaints Denies pain, constipation Sleeping ok- does not want melatonin.  Eating so-so  ROS: Denies pain, constipation  Objective:   No results found. Recent Labs    10/24/19 0038  WBC 10.9*  HGB 11.1*  HCT 37.8  PLT 224   Recent Labs    10/24/19 0304  NA 142  K 4.8  CL 113*  CO2 21*  GLUCOSE 89  BUN 33*  CREATININE 1.05*  CALCIUM 8.7*    Intake/Output Summary (Last 24 hours) at 10/26/2019 0921 Last data filed at 10/25/2019 2024 Gross per 24 hour  Intake 480 ml  Output --  Net 480 ml     Physical Exam: Vital Signs Blood pressure 125/89, pulse 63, temperature 98.2 F (36.8 C), resp. rate 18, height 4\' 11"  (1.499 m), weight 62.5 kg, SpO2 95 %. General: Alert and oriented x 3, No apparent distress HEENT: Head is normocephalic, atraumatic, PERRLA, EOMI, sclera anicteric, oral mucosa pink and moist, dentition intact    Comments: Stoma as above Cardiovascular:     Comments: RRR_ no M/R/G Pulmonary:     Breath sounds: Examination of the right-middle field reveals decreased breath sounds. Examination of the right-lower field reveals decreased breath sounds. Decreased breath sounds present.     Comments: Decreased at bases, but not coarse, no W/R/R- adequate to  good air movement overall Abdominal:     Comments: Soft, NT, ND, (+)BS  Genitourinary:    Comments: purewick in place Musculoskeletal:     Cervical back: Normal range of motion and neck supple.     Right lower leg: No edema.     Comments: UEs- 5-/5 in biceps, triceps; and 4+/5 in WE< grip and finger abd B/L LEs- HF 3+/5, KE 4-/5, DF and PF 4+/5  Skin:    Comments: Moisture and slightly raw underneath breasts- no actual rash seen Has hyperpigmentation rash on L side of neck and B/left knees L wrist IV- looks OK  Neurological:     Mental Status: She is alert and oriented to person, place, and time.     Comments:  Intact to light touch in all 4 extremities  Psychiatric:        Mood and Affect: Mood normal.        Behavior: Behavior normal.     Assessment/Plan: 1. Functional deficits secondary to debility which require 3+ hours per day of interdisciplinary therapy in a comprehensive inpatient rehab setting.  Physiatrist is providing close team supervision and 24 hour management of active medical problems listed below.  Physiatrist and rehab team continue to assess barriers to discharge/monitor patient progress toward functional and medical goals  Care Tool:  Bathing              Bathing assist       Upper Body Dressing/Undressing Upper body dressing   What is the patient wearing?: Pull over shirt    Upper body assist Assist Level: Minimal Assistance - Patient > 75%    Lower Body Dressing/Undressing Lower body dressing      What is the patient wearing?: Incontinence brief     Lower body assist Assist for lower body dressing: Contact Guard/Touching assist     Toileting Toileting    Toileting assist Assist for toileting: Minimal Assistance - Patient > 75%     Transfers Chair/bed transfer  Transfers assist           Locomotion Ambulation   Ambulation assist  Walk 10 feet activity   Assist           Walk 50 feet activity   Assist           Walk 150 feet activity   Assist           Walk 10 feet on uneven surface  activity   Assist           Wheelchair     Assist               Wheelchair 50 feet with 2 turns activity    Assist            Wheelchair 150 feet activity     Assist          Blood pressure 125/89, pulse 63, temperature 98.2 F (36.8 C), resp. rate 18, height 4\' 11"  (1.499 m), weight 62.5 kg, SpO2 95 %.  Medical Problem List and Plan: 1.  Debility secondary to B/L pneumonia - prolonged hospitalization with hx of laryngectomy and chronic trach             -patient may   Shower- be careful with open trach             -ELOS/Goals: 7-10 days- mod I to supervision  -Team conference today.  2.  H/o DVT/Antithrombotics: -anticoagulation:  Pharmaceutical: Other (comment)--on Eliquis             -antiplatelet therapy: will determine if needs 3. Pain Management: Denies pain.  4. Mood: LCSW to follow for evaluation and support.              -antipsychotic agents: N/A 5. Neuropsych: This patient is capable of making decisions on her own behalf. 6. Skin/Wound Care: Routine pressure relief measures.  7. Fluids/Electrolytes/Nutrition: Monitor I/O. Check lytes weekly. 8. RLL HCAP: Cefepime completed 08/31.  9. COPD: Recent exacerbations--has intermittent tachypnea with RR in mid 20's. Nebs weaned down to bid now. Was not on O2 when saw this afternoon, but is needing sometimes with exertion. Currently breathing well on room air 10. Pre-renal azotemia: Encourage fluid intake. Repeat labs in am.  11. Leucocytosis: Resolving 13.7-->10.9. Monitor for signs of infection.  12. Mild anemia: Recheck labs in am. Monitor for signs of bleeding.  13. HTN: Monitor BP tid--has been labile. Continue metoprolol bid.     LOS: 1 days A FACE TO FACE EVALUATION WAS PERFORMED  Martha Clan P Saud Bail 10/26/2019, 9:21 AM

## 2019-10-26 NOTE — Progress Notes (Signed)
Patient ID: Katelyn [name], female   DOB: [birth year], 69 y.o.   MRN: [id number] Met with the patient to review role of the nurse CM and review nursing concerns including skin care/rash under breast. Order for Nystatin per Md with universal skin care initiated per protocol for MASD. Nurse and patient made aware. Reported no one at the home smokes but could not breathe when she was first discharged. Was not aware that she had lingering pna. Better now. No other concerns and reports has supportive family. Patient is [facility] patient and had referral to [facility] consult for follow up at discharge along with [facility]. Patient given a pen and notebook for communication with staff due to need to mouth responses. [name]

## 2019-10-26 NOTE — Progress Notes (Signed)
Lunch tray brought in to pt, pt had sandwich and began to choke, notified PA, pt now full supv with meals and diet down graded. Pt also has no teeth. Also no MASD located under breast, interday returned.

## 2019-10-27 ENCOUNTER — Inpatient Hospital Stay (HOSPITAL_COMMUNITY): Payer: Medicare Other | Admitting: Occupational Therapy

## 2019-10-27 ENCOUNTER — Inpatient Hospital Stay (HOSPITAL_COMMUNITY): Payer: Medicare Other

## 2019-10-27 ENCOUNTER — Inpatient Hospital Stay (HOSPITAL_COMMUNITY): Payer: Medicare Other | Admitting: *Deleted

## 2019-10-27 MED ORDER — MELATONIN 3 MG PO TABS: 3.0000 mg | ORAL_TABLET | Freq: Every evening | ORAL | Status: DC | PRN

## 2019-10-27 NOTE — Progress Notes (Signed)
Recreational Therapy Assessment and Plan  Patient Details  Name: Katelyn [name] MRN: [id number] Date of Birth: January 14, 1951 Today's Date: 10/27/2019  Rehab Potential:   Good ELOS:  d/c 9/11   Assessment  Hospital Problem: Principal Problem:   Debility Active Problems:   Acute respiratory failure with hypoxia [facility])   Past Medical History:      Past Medical History:  Diagnosis Date  . Acute gallstone pancreatitis 01/08/2014  . AKI (acute kidney injury) ([place]) 01/08/2014  . Anemia   . Blood transfusion    11'15 last admission  . Cancer ([place])   . Cholecystitis   . H/O tracheostomy   . Hip fracture ([place])   . Hypercholesteremia   . Hypertension   . Right leg DVT ([place]) 05/08/2014  . Septic shock - Kelbsiella 01/09/2014  . Shortness of breath   . Stroke [facility])    '13- right side weakness, ambulates with cane   Past Surgical History:       Past Surgical History:  Procedure Laterality Date  . ABDOMINAL HYSTERECTOMY    . DIRECT LARYNGOSCOPY N/A 06/08/2017   Procedure: DIRECT LARYNGOSCOPY WITH BIOPSY;  Surgeon: Helayne Seminole, MD;  Location: [facility] OR;  Service: ENT;  Laterality: N/A;  . ENDOSCOPIC RETROGRADE CHOLANGIOPANCREATOGRAPHY (ERCP) WITH PROPOFOL N/A 01/11/2014   Procedure: ENDOSCOPIC RETROGRADE CHOLANGIOPANCREATOGRAPHY (ERCP) WITH PROPOFOL;  Surgeon: Gatha Mayer, MD;  Location: [place];  Service: Endoscopy;  Laterality: N/A;  . ERCP    . ERCP N/A 05/26/2014   Procedure: ENDOSCOPIC RETROGRADE CHOLANGIOPANCREATOGRAPHY (ERCP);  Surgeon: Gatha Mayer, MD;  Location: [facility] ENDOSCOPY;  Service: Endoscopy;  Laterality: N/A;  . ESOPHAGOGASTRODUODENOSCOPY (EGD) WITH PROPOFOL N/A 12/07/2014   Procedure: ESOPHAGOGASTRODUODENOSCOPY (EGD) WITH PROPOFOL;  Surgeon: Milus Banister, MD;  Location: [facility] ENDOSCOPY;  Service: Endoscopy;  Laterality: N/A;  . EUS N/A 12/07/2014   Procedure: UPPER ENDOSCOPIC ULTRASOUND (EUS) LINEAR;  Surgeon: Milus Banister, MD;   Location: [facility] ENDOSCOPY;  Service: Endoscopy;  Laterality: N/A;  . GASTROINTESTINAL STENT REMOVAL N/A 12/07/2014   Procedure: GASTROINTESTINAL STENT REMOVAL;  Surgeon: Milus Banister, MD;  Location: [facility] ENDOSCOPY;  Service: Endoscopy;  Laterality: N/A;  pancreatic stent removal  . HIP FRACTURE SURGERY    . INTRAMEDULLARY (IM) NAIL INTERTROCHANTERIC Right 05/29/2017   Procedure: INTRAMEDULLARY (IM) NAIL INTERTROCHANTRIC FEMUR;  Surgeon: Nicholes Stairs, MD;  Location: [place];  Service: Orthopedics;  Laterality: Right;  . PERCUTANEOUS TRACHEOSTOMY N/A 05/29/2017   Procedure: PERCUTANEOUS TRACHEOSTOMY;  Surgeon: Judeth Horn, MD;  Location: [place];  Service: General;  Laterality: N/A;  . TEE WITHOUT CARDIOVERSION  03/06/2011   Procedure: TRANSESOPHAGEAL ECHOCARDIOGRAM (TEE);  Surgeon: Jolaine Artist, MD;  Location[facility] END[facility]COPY;  Service: Cardiovascular;  Laterality: N/A;    Assessment & Plan Clinical Impression: Patient is a 69 y.o. year old female with medical history significant oflaryngealcancers/p laryngectomy, tracheostomy, gallstone pancreatitis,history of right hip fracture, hyperlipidemia, hypertension, right lower extremity DVT, history of Klebsiella septic shock, history of other nonhemorrhagic CVA with right-sided weakness who was discharged8/24/21after a 12-day hospitalization due to respiratory failure secondary to COPD exacerbation, bilateral pneumonia and aspiration pneumonia. She returnedto the emergency department after complaining of exacerbation of dyspnea twice overnight requiring each time to be seen by EMS. Imaging revealed right-sided pneumonia. Patient was started on vancomycin, cefepime. She was also started on Solu-Medrol for suspected COPD exacerbation. [facility] coordinator and physician.    Met with pt today to discuss  TR services, leisure education  and activity analysis.  Pt presents with decreased activity tolerance, decreased functional mobility, decreased balance, decreased cardiorespiratoy endurance Limiting pt's independence with leisure/community pursuits. Plan  Min 1 TR session >20 minutes during LOS  Recommendations for other services: None   Discharge Criteria: Patient will be discharged from TR if patient refuses treatment 3 consecutive times without medical reason.  If treatment goals not met, if there is a change in medical status, if patient makes no progress towards goals or if patient is discharged from hospital.  The above assessment, treatment plan, treatment alternatives and goals were discussed and mutually agreed upon: by patient  Katelyn [name] 10/27/2019, 4:09 PM

## 2019-10-27 NOTE — Progress Notes (Signed)
Physical Therapy Session Note  Patient Details  Name: Katelyn [name] MRN: [id number] Date of Birth: 03-17-1950  Today's Date: 10/27/2019 PT Individual Time: 0930-1026 and 1415-1455  PT Individual Time Calculation (min): 56 min and 40 min  Short Term Goals: Week 1:  PT Short Term Goal 1 (Week 1): pt will perform bed mobility with CGA overall PT Short Term Goal 2 (Week 1): Pt will transfer bed<>WC with LRAD CGA PT Short Term Goal 3 (Week 1): Pt will ambulate 56ft with LRAD CGA  Skilled Therapeutic Interventions/Progress Updates:   Treatment Session 1: 0930-1026 64min Received pt sitting in WC, pt agreeable to therapy, and denied any pain during session. Session with emphasis on functional mobility/transfers, generalized strengthening, dynamic standing balance/coordination, ambulation, and improved activity tolerance. O2 sat 78% increasing to 93% on RA in <15 seconds and pt denied any SOB. Pt performed WC mobility 55ft using bilateral UEs and supervision and transported to dayroom remainder of way in WC total A. Pt ambulated 61ft x 2 trials with RW and mod A for trial 1 and min A for trial 2. Pt with decreased weight shifting to R, holds R LE in ER, and demonstrates flexed trunk, and poor RW safety awareness requiring manual facilitation from therapist to steer RW away from R. Pt also required max cues for turning technique and backing up to mat. O2 sat 69% increasing to 89% in <15 seconds; pt asymptomatic. While ambulating pt distracted by another pt dribbling basketball and pt expressed desire to try. Worked on dynamic standing balance without AD dribbling basketball 2x15 reps with min A for balance. Pt reported she was very active prior to her stroke playing volleyball, basketball, baseball, and walking 2 miles each day. Engaged in dynamic sitting balance playing volleyball x5 minutes and x3 minutes with emphasis on reaching outside BOS, UE strength/ROM, and core strength. O2 sat 85% increasing to  90% with seated rest break. Pt transferred sit<>stand without AD x5 reps with min A and cues to let go of WC armrests when standing. Pt hesitant and fearful of falling. Pt transported back to room in [facility] total A. Concluded session with pt sitting in WC, needs within reach, and seatbelt alarm on. RN present to administer medications.   Treatment Session 2: 8242-3536 40 min Received pt sitting in WC, pt agreeable to therapy, and denied any pain during session. Nursing student present during session. Session with emphasis on functional mobility/transfers, generalized strengthening, dynamic standing balance/coordination, ambulation, and improved activity tolerance. Pt transported to therapy gym in [facility] total A and worked on dynamic standing balance playing basketball x 2 trials with min A without AD. Pt able to remain standing approximately 1 minute each trial. O2 sat 89% increasing to 97% with 2 minute seated rest break. Pt denied any SOB but reported increased fatigue from numerous therapy sessions today. Pt ambulated 72ft with RW min A +2 for WC follow demonstrating same gait pattern from previous session. Pt continues to require cues and manual facilitation from therapist for RW safety. Pt performed R LE toe taps to 3 cones with mod handheld assist x 2 trials. Therapist encouraged L LE toe taps, however pt politely declined due to increased pain on R LE with weight bearing. Pt transported back to room in [facility] total A. Concluded session with pt sitting in WC, needs within reach, and seatbelt alarm on.   Therapy Documentation Precautions:  Precautions Precautions: Fall Precaution Comments: Trach Restrictions Weight Bearing Restrictions: No   Therapy/Group: Individual Therapy Vicente Males  Andrew, DPT   10/27/2019, 7:27 AM

## 2019-10-27 NOTE — Progress Notes (Signed)
Physical Therapy Session Note  Patient Details  Name: Katelyn [name] MRN: [id number] Date of Birth: 07/25/1950  Today's Date: 10/27/2019 PT Individual Time: 1100-1157 PT Individual Time Calculation (min): 57 min   Short Term Goals: Week 1:  PT Short Term Goal 1 (Week 1): pt will perform bed mobility with CGA overall PT Short Term Goal 2 (Week 1): Pt will transfer bed<>WC with LRAD CGA PT Short Term Goal 3 (Week 1): Pt will ambulate 41ft with LRAD CGA  Skilled Therapeutic Interventions/Progress Updates:    Pt received sitting in w/c, agreeable to PT session. Pt on trach mask 21% ATC, spo2 reading 97%. Pt on RA throughout session, spO2 92-97% throughout. Pt propelled manual w/c from her room to day room gym with supervision, cues for improving backward reach on rims to maximize propulsion. Once in dayroom, seated rest break provided and then pt performed sit<>stand with CGA/minA and RW and ambulated ~45ft with minA and RW. With gait, she demo's decreased B step length, R foot externally rotated, decreased R knee flexion in swing causing R hip hike, and decreased R heel strike. Pt with difficulty correcting these deficits with max cueing. After seated rest, pt then performed standing ball bouncing with RUE with LUE supported on RW, focusing on RUE coordination and standing balance/tolerance. Pt then transported to main therapy gym where she performed both seated and standing basketball shots into basketball goal without BUE support, therapist providing minA guard for truncal stability. Pt then navigated up/down ~57ft ramp in her w/c, requiring minA for ascent, mod/maxA for descent. During descent of w/c ramp, pt letting go of her w/c and attempting to grab HR's that were on the side, requiring maxA for recovery. Pt then ambulated ~10ft with RW, initially requiring minA however progressing to mod/maxA towards end 2/2 fatigue. With fatigue, she show's worsening crouched gait, very effortful, and decreased  step length. SpO2 reading 95%. After seated recovery, pt was left sitting in w/c, seat belt alarm on, trach mask on, needs in reach.  Therapy Documentation Precautions:  Precautions Precautions: Fall Precaution Comments: Trach Restrictions Weight Bearing Restrictions: No Vital Signs: Therapy Vitals Pulse Rate: 84 Resp: 18 Oxygen Therapy SpO2: 93 % O2 Device: Room Air O2 Flow Rate (L/min): 5 L/min FiO2 (%): 21 %  Therapy/Group: Individual Therapy   Taylie Helder P Erbie Arment PT 10/27/2019, 12:27 PM

## 2019-10-27 NOTE — Progress Notes (Signed)
RT retrieved a large mucous plug from the patient's stoma. Patient was placed on 21% ATC. Patient is not in any distress at this time. RN was made aware.

## 2019-10-27 NOTE — Progress Notes (Addendum)
Occupational Therapy Session Note  Patient Details  Name: Katelyn [name] MRN: [id number] Date of Birth: [birth year]  Today's Date: 10/27/2019 OT Individual Time: 6568-1275 OT Individual Time Calculation (min): 55 min    Short Term Goals: Week 1:  OT Short Term Goal 1 (Week 1): Pt will complete bathing with min assist with <2 rest breaks to demonstrate increased activity tolerance OT Short Term Goal 2 (Week 1): Pt will complete LB dressing with min assist with <1 rest break to demonstrate increased activity tolerance OT Short Term Goal 3 (Week 1): Pt will complete toileting with min assist  Skilled Therapeutic Interventions/Progress Updates:    Session 1:  (1700-1749) Pt in bed finishing breakfast to start session.  She was agreeable to completion of ADL tasks at the sink sit to stand.  Min guard assist for supine to sit with HOB slightly elevated and with use of the bed rail.  She then worked on transfer from the bed to the wheelchair with use of the RW and min assist.  Mod instructional cueing needed for pushing up from the bed and reaching back to the chair, instead of holding onto the walker.  She completed all UB bathing with supervision and donned a pullover shirt with min assist to pull it down in the back.  She then washed her upper legs as well as front and back peri area at min assist level.  She did not attempt washing her lower legs and feet this session secondary to decreased time.  She was able to donn her brief and pants with mod assist overall.  Oxygen sats at 96% on room air throughout session.  She was left sitting in the wheelchair with the call button and phone in reach and safety belt in place.    Session 2:  [phone]) Pt worked on sit to stand and standing balance with integration of the [employer] during session.  She was able to perform sit to stand and maintain standing balance with overall min assist while using the RW during task.  When this was taken away, she needed  mod assist for overall standing balance secondary to LOB to the left and forward.  Increased RLE external rotation is noted in standing.  She completed 3 intervals with an average reaction time of 2.0-2.25 seconds.  Oxygen sats remained at 93% on room air post activity while resting with HR in the mid 50's.  Finished session with return to the room via wheelchair and pt using the RW to ambulate a short distance to the bed with min assist.  Decreased step length noted with RLE external rotation present.  She was able to transition to supine with min assist to complete session.  Respiratory therapist in at the end of the session as well.  Call button and phone in reach.   Therapy Documentation Precautions:  Precautions Precautions: Fall Precaution Comments: Trach Restrictions Weight Bearing Restrictions: No  Pain: Pain Assessment Pain Scale: Faces Pain Score: 0-No pain ADL: See Care Tool Section for some details of mobility and selfcare  Therapy/Group: Individual Therapy  Brendaly Townsel OTR/L 10/27/2019, 10:33 AM

## 2019-10-27 NOTE — Progress Notes (Signed)
[facility] PHYSICAL MEDICINE & REHABILITATION PROGRESS NOTE   Subjective/Complaints: Sleeping poorly. Willing to try melatonin prn.  Denies pain, constipation. Appreciate RT help removing mucus plug from stoma.   ROS: Denies pain, constipation  Objective:   No results found. Recent Labs    10/26/19 1115  WBC 6.9  HGB 10.3*  HCT 34.8*  PLT 207   Recent Labs    10/26/19 1115  NA 142  K 4.3  CL 111  CO2 22  GLUCOSE 86  BUN 24*  CREATININE 0.94  CALCIUM 9.2    Intake/Output Summary (Last 24 hours) at 10/27/2019 1219 Last data filed at 10/27/2019 0816 Gross per 24 hour  Intake 245 ml  Output --  Net 245 ml     Physical Exam: Vital Signs Blood pressure 135/68, pulse 84, temperature 97.6 F (36.4 C), resp. rate 18, height 4\' 11"  (1.499 m), weight 62.5 kg, SpO2 93 %. General: Alert and oriented x 3, No apparent distress HEENT: Head is normocephalic, Stoma w/ mucus plug Neck: Supple without JVD or lymphadenopathy Heart: Reg rate and rhythm. No murmurs rubs or gallops Chest: Decreased at bases, but not coarse, no W/R/R- adequate to  good air movement overall Abdomen: Soft, non-tender, non-distended, bowel sounds positive. Extremities: No clubbing, cyanosis, or edema. Pulses are 2+ Skin: Moisture and slightly raw underneath breasts- no actual rash seen Has hyperpigmentation rash on L side of neck and B/left knees Neuro: Pt is cognitively appropriate with normal insight, memory, and awareness. Cranial nerves 2-12 are intact. UEs- 5-/5 in biceps, triceps; and 4+/5 in WE< grip and finger abd B/L LEs- HF 3+/5, KE 4-/5, DF and PF 4+/5  Musculoskeletal: Full ROM, No pain with AROM or PROM in the neck, trunk, or extremities. Posture appropriate Psych: Pt's affect is appropriate. Pt is cooperative     Assessment/Plan: 1. Functional deficits secondary to debility which require 3+ hours per day of interdisciplinary therapy in a comprehensive inpatient rehab  setting.  Physiatrist is providing close team supervision and 24 hour management of active medical problems listed below.  Physiatrist and rehab team continue to assess barriers to discharge/monitor patient progress toward functional and medical goals  Care Tool:  Bathing  Bathing activity did not occur: Refused Body parts bathed by patient: Right arm, Left arm, Chest, Abdomen, Front perineal area, Buttocks, Right upper leg, Left upper leg, Face   Body parts bathed by helper: Right lower leg, Left lower leg     Bathing assist Assist Level: Minimal Assistance - Patient > 75%     Upper Body Dressing/Undressing Upper body dressing Upper body dressing/undressing activity did not occur (including orthotics): Refused What is the patient wearing?: Pull over shirt    Upper body assist Assist Level: Minimal Assistance - Patient > 75%    Lower Body Dressing/Undressing Lower body dressing    Lower body dressing activity did not occur: Refused What is the patient wearing?: Pants, Incontinence brief     Lower body assist Assist for lower body dressing: Moderate Assistance - Patient 50 - 74%     Toileting Toileting    Toileting assist Assist for toileting: Minimal Assistance - Patient > 75%     Transfers Chair/bed transfer  Transfers assist     Chair/bed transfer assist level: Minimal Assistance - Patient > 75%     Locomotion Ambulation   Ambulation assist      Assist level: Moderate Assistance - Patient 50 - 74% Assistive device: Walker-rolling Max distance: 37ft   Walk  10 feet activity   Assist     Assist level: Moderate Assistance - Patient - 50 - 74% Assistive device: Walker-rolling   Walk 50 feet activity   Assist Walk 50 feet with 2 turns activity did not occur: Safety/medical concerns (decreased balance/postural control, generalized weakness, poor endurance)         Walk 150 feet activity   Assist Walk 150 feet activity did not occur:  Safety/medical concerns (decreased balance/postural control, generalized weakness, poor endurance)         Walk 10 feet on uneven surface  activity   Assist Walk 10 feet on uneven surfaces activity did not occur: Safety/medical concerns (decreased balance/postural control, generalized weakness, poor endurance)         Wheelchair     Assist Will patient use wheelchair at discharge?: Yes Type of Wheelchair: Manual    Wheelchair assist level: Supervision/Verbal cueing Max wheelchair distance: 22ft    Wheelchair 50 feet with 2 turns activity    Assist        Assist Level: Moderate Assistance - Patient 50 - 74%   Wheelchair 150 feet activity     Assist      Assist Level: Total Assistance - Patient < 25%   Blood pressure 135/68, pulse 84, temperature 97.6 F (36.4 C), resp. rate 18, height 4\' 11"  (1.499 m), weight 62.5 kg, SpO2 93 %.  Medical Problem List and Plan: 1.  Debility secondary to B/L pneumonia - prolonged hospitalization with hx of laryngectomy and chronic trach             -patient may  Shower- be careful with open trach             -ELOS/Goals: 7-10 days- mod I to supervision  -Continue CIR 2.  H/o DVT/Antithrombotics: -anticoagulation:  Pharmaceutical: Other (comment)--on Eliquis             -antiplatelet therapy: will determine if needs 3. Pain Management: Denies pain  4. Mood: LCSW to follow for evaluation and support.              -antipsychotic agents: N/A 5. Neuropsych: This patient is capable of making decisions on her own behalf. 6. Skin/Wound Care: Routine pressure relief measures.  7. Fluids/Electrolytes/Nutrition: Monitor I/O. Check lytes weekly. 8. RLL HCAP: Cefepime completed 08/31.  9. COPD: Recent exacerbations--has intermittent tachypnea with RR in mid 20's. Nebs weaned down to bid now. Was not on O2 when saw this afternoon, but is needing sometimes with exertion. Currently breathing well on room air. Appreciate RT help in  removal of mucus plug from stoma 10. Pre-renal azotemia: Encourage fluid intake. Repeat labs in am.  11. Leucocytosis: Resolving 13.7-->10.9. Monitor for signs of infection.  12. Mild anemia: Recheck labs in am. Monitor for signs of bleeding.  13. HTN: Monitor BP tid--has been labile. Continue metoprolol bid.  14. Insomnia: melatonin prn ordered    LOS: 2 days A FACE TO FACE EVALUATION WAS PERFORMED  Martha Clan P Preciosa [place] 10/27/2019, 12:19 PM

## 2019-10-28 ENCOUNTER — Inpatient Hospital Stay (HOSPITAL_COMMUNITY): Payer: Medicare Other | Admitting: Occupational Therapy

## 2019-10-28 ENCOUNTER — Inpatient Hospital Stay (HOSPITAL_COMMUNITY): Payer: Medicare Other

## 2019-10-28 ENCOUNTER — Ambulatory Visit (HOSPITAL_COMMUNITY): Payer: Medicare Other | Admitting: *Deleted

## 2019-10-28 MED ORDER — ACETAMINOPHEN 160 MG/5ML PO SOLN
325.0000 mg | ORAL | Status: DC | PRN
  Administered 2019-10-28 – 2019-11-02 (×5): 650 mg via ORAL
  Filled 2019-10-28 (×5): qty 20.3

## 2019-10-28 NOTE — IPOC Note (Signed)
Overall Plan of Care [facility]) Patient Details Name: Katelyn [name] MRN: [id number] DOB: Jun 19, 1950  Admitting Diagnosis: [facility] Problems: Principal Problem:   Debility Active Problems:   Acute respiratory failure with hypoxia [facility])     Functional Problem List: Nursing Bladder, Bowel, Skin Integrity, Safety, Medication Management, Endurance  PT Balance, Endurance, Motor, Safety  OT Balance, Endurance, Motor, Pain, Safety  SLP    TR         Basic ADL's: OT Grooming, Bathing, Dressing, Toileting     Advanced  ADL's: OT       Transfers: PT Bed Mobility, Bed to Chair, Car, Chief Operating Officer: PT Ambulation, Emergency planning/management officer, Stairs     Additional Impairments: OT None  SLP        TR      Anticipated Outcomes Item Anticipated Outcome  Self Feeding    Swallowing      Basic self-care  Media planner Transfers Supervision  Bowel/Bladder  patient will have fewer periods of incontinence  Transfers  supervision with LRAD  Locomotion  supervision with LRAD  Communication     Cognition     Pain  pain less than 3  Safety/Judgment  patient will call for assistance and not fall during this admission   Therapy Plan: PT Intensity: Minimum of 1-2 x/day ,45 to 90 minutes PT Frequency: 5 out of 7 days PT Duration Estimated Length of Stay: 10-12 days OT Intensity: Minimum of 1-2 x/day, 45 to 90 minutes OT Frequency: 5 out of 7 days OT Duration/Estimated Length of Stay: 10-12 days     Due to the current state of emergency, patients may not be receiving their 3-hours of Medicare-mandated therapy.   Team Interventions: Nursing Interventions Patient/Family Education, Bladder Management, Bowel Management, Medication Management, Skin Care/Wound Management, Dysphagia/Aspiration Precaution Training, Discharge Planning, Psychosocial Support  PT interventions Ambulation/gait training, Discharge planning,  Functional mobility training, Psychosocial support, Therapeutic Activities, Balance/vestibular training, Disease management/prevention, Neuromuscular re-education, Therapeutic Exercise, Wheelchair propulsion/positioning, DME/adaptive equipment instruction, Pain management, Splinting/orthotics, UE/LE Strength taining/ROM, Community reintegration, Technical sales engineer stimulation, Patient/family education, IT trainer, UE/LE Coordination activities  OT Interventions Training and development officer, Academic librarian, Discharge planning, Disease mangement/prevention, Engineer, drilling, Functional mobility training, Pain management, Patient/family education, Psychosocial support, Self Care/advanced ADL retraining, Therapeutic Activities, Therapeutic Exercise, UE/LE Strength taining/ROM  SLP Interventions    TR Interventions    SW/CM Interventions Discharge Planning, Psychosocial Support, Patient/Family Education   Barriers to Discharge MD  Medical stability  Nursing      PT Trach, Other (comments) difficulty communicating due to trach, decreased endurance, decreased balance  OT      SLP      SW Medication compliance Pt not always compliant with her meds and follow up appointments   Team Discharge Planning: Destination: PT-Home ,OT- Home , SLP-  Projected Follow-up: PT-Home health PT, OT-  Home health OT, SLP-  Projected Equipment Needs: PT-To be determined, OT- None recommended by OT, SLP-  Equipment Details: PT-has RW and rollator, OT-  Patient/family involved in discharge planning: PT- Patient,  OT-Patient, SLP-   MD ELOS:7-10 days modI- S Medical Rehab Prognosis:  Excellent Assessment: Katelyn [name] is a 69 year old woman who was admitted to CIR with debility secondary to bilateral pneumonia with prolonged hospitalization. She has a history of laryngectomy and chronic trach. She is on Eliquis for anticoagulation. Pain has been well controlled. RT has helped to remove  a mucus plug  from her stoma. Oxygen saturation is being monitored regularly. Labs are being monitored regularly given her anemia, pre-renal azotemia, and leukocytosis. Melatonin has been started for her insomnia.    See Team Conference Notes for weekly updates to the plan of care

## 2019-10-28 NOTE — Progress Notes (Signed)
[place] PHYSICAL MEDICINE & REHABILITATION PROGRESS NOTE   Subjective/Complaints: No complaints this morning.  Breathing well with trach collar Denies constipation Not much appetite  ROS: Denies pain, constipation  Objective:   No results found. Recent Labs    10/26/19 1115  WBC 6.9  HGB 10.3*  HCT 34.8*  PLT 207   Recent Labs    10/26/19 1115  NA 142  K 4.3  CL 111  CO2 22  GLUCOSE 86  BUN 24*  CREATININE 0.94  CALCIUM 9.2    Intake/Output Summary (Last 24 hours) at 10/28/2019 0848 Last data filed at 10/28/2019 0816 Gross per 24 hour  Intake 420 ml  Output --  Net 420 ml     Physical Exam: Vital Signs Blood pressure 116/70, pulse 61, temperature 97.9 F (36.6 C), temperature source Axillary, resp. rate 16, height 4\' 11"  (1.499 m), weight 62.5 kg, SpO2 93 %. General: Alert and oriented x 3, No apparent distress HEENT: Head is normocephalic, Stoma w/ mucus plug Neck: Supple without JVD or lymphadenopathy Heart: Reg rate and rhythm. No murmurs rubs or gallops    General: Alert and oriented x 3, No apparent distress HEENT: Head is normocephalic, trach collar in place Neck: Supple without JVD or lymphadenopathy Heart: Reg rate and rhythm. No murmurs rubs or gallops Chest: Decreased at bases, but not coarse, no W/R/R- adequate to  good air movement overall Abdomen: Soft, non-tender, non-distended, bowel sounds positive. Extremities: No clubbing, cyanosis, or edema. Pulses are 2+ Skin: Moisture and slightly raw underneath breasts- no actual rash seen Has hyperpigmentation rash on L side of neck and B/left knees Neuro: Pt is cognitively appropriate with normal insight, memory, and awareness. Cranial nerves 2-12 are intact. UEs- 5-/5 in biceps, triceps; and 4+/5 in WE< grip and finger abd B/L LEs- HF 3+/5, KE 4-/5, DF and PF 4+/5  Musculoskeletal: Full ROM, No pain with AROM or PROM in the neck, trunk, or extremities. Posture appropriate Psych: Pt's affect is  appropriate. Pt is cooperative      Assessment/Plan: 1. Functional deficits secondary to debility which require 3+ hours per day of interdisciplinary therapy in a comprehensive inpatient rehab setting.  Physiatrist is providing close team supervision and 24 hour management of active medical problems listed below.  Physiatrist and rehab team continue to assess barriers to discharge/monitor patient progress toward functional and medical goals  Care Tool:  Bathing  Bathing activity did not occur: Refused Body parts bathed by patient: Right arm, Left arm, Chest, Abdomen, Front perineal area, Buttocks, Right upper leg, Left upper leg, Face   Body parts bathed by helper: Right lower leg, Left lower leg     Bathing assist Assist Level: Minimal Assistance - Patient > 75%     Upper Body Dressing/Undressing Upper body dressing Upper body dressing/undressing activity did not occur (including orthotics): Refused What is the patient wearing?: Pull over shirt    Upper body assist Assist Level: Minimal Assistance - Patient > 75%    Lower Body Dressing/Undressing Lower body dressing    Lower body dressing activity did not occur: Refused What is the patient wearing?: Pants, Incontinence brief     Lower body assist Assist for lower body dressing: Maximal Assistance - Patient 25 - 49%     Toileting Toileting    Toileting assist Assist for toileting: Minimal Assistance - Patient > 75%     Transfers Chair/bed transfer  Transfers assist     Chair/bed transfer assist level: Minimal Assistance - Patient >  75%     Locomotion Ambulation   Ambulation assist      Assist level: Moderate Assistance - Patient 50 - 74% Assistive device: Walker-rolling Max distance: 48ft   Walk 10 feet activity   Assist     Assist level: Moderate Assistance - Patient - 50 - 74% Assistive device: Walker-rolling   Walk 50 feet activity   Assist Walk 50 feet with 2 turns activity did not  occur: Safety/medical concerns (decreased balance/postural control, generalized weakness, poor endurance)         Walk 150 feet activity   Assist Walk 150 feet activity did not occur: Safety/medical concerns (decreased balance/postural control, generalized weakness, poor endurance)         Walk 10 feet on uneven surface  activity   Assist Walk 10 feet on uneven surfaces activity did not occur: Safety/medical concerns (decreased balance/postural control, generalized weakness, poor endurance)         Wheelchair     Assist Will patient use wheelchair at discharge?: Yes Type of Wheelchair: Manual    Wheelchair assist level: Supervision/Verbal cueing Max wheelchair distance: 79ft    Wheelchair 50 feet with 2 turns activity    Assist        Assist Level: Moderate Assistance - Patient 50 - 74%   Wheelchair 150 feet activity     Assist      Assist Level: Total Assistance - Patient < 25%   Blood pressure 116/70, pulse 61, temperature 97.9 F (36.6 C), temperature source Axillary, resp. rate 16, height 4\' 11"  (1.499 m), weight 62.5 kg, SpO2 93 %.  Medical Problem List and Plan: 1.  Debility secondary to B/L pneumonia - prolonged hospitalization with hx of laryngectomy and chronic trach             -patient may  Shower- be careful with open trach             -ELOS/Goals: 7-10 days- mod I to supervision  -Continue CIR 2.  H/o DVT/Antithrombotics: -anticoagulation:  Pharmaceutical: Other (comment)--on Eliquis             -antiplatelet therapy: will determine if needs 3. Pain Management: Denies pain 4. Mood: LCSW to follow for evaluation and support.              -antipsychotic agents: N/A 5. Neuropsych: This patient is capable of making decisions on her own behalf. 6. Skin/Wound Care: Routine pressure relief measures.  7. Fluids/Electrolytes/Nutrition: Monitor I/O. Lytes stable 9/1 8. RLL HCAP: Cefepime completed 08/31.  9. COPD: Recent  exacerbations--has intermittent tachypnea with RR in mid 20's. Nebs weaned down to bid now. Was not on O2 when saw this afternoon, but is needing sometimes with exertion. Currently breathing well on trach collar. Daily RT care 10. Pre-renal azotemia: Encourage fluid intake. Repeat labs in am.  11. Leucocytosis: Resolving 13.7-->10.9. Monitor for signs of infection.  12. Mild anemia: Recheck labs in am. Monitor for signs of bleeding.  13. HTN: Monitor BP tid--has been labile. Continue metoprolol bid.  14. Insomnia: melatonin prn ordered    LOS: 3 days A FACE TO FACE EVALUATION WAS PERFORMED  Clide Deutscher Xenia Nile 10/28/2019, 8:48 AM

## 2019-10-28 NOTE — Progress Notes (Signed)
Recreational Therapy Session Note  Patient Details  Name: Katelyn [name] MRN: [id number] Date of Birth: [birth year] Today's Date: 10/28/2019  Pain: c/o R hip pain, unrated but requesting pain meds Skilled Therapeutic Interventions/Progress Updates: Session focused on activity tolerance and dynamic balance.  Pt stood in front of the mat in the therapy gym with min assist,mod-max assist for multiple LOB during modified volleyball activity as pt says she enjoys this task.  Pt with increasing R hip pain during activity needing to sit down to alleviate pain.  Attempted activity a couple more times and pt unable & requesting pain medicine.  RN made aware and meds given.  Pt sat EOM for volley ball and ball toss activities with supervision.  Therapy/Group: Co-Treatment  Iniko Robles 10/28/2019, 12:11 PM

## 2019-10-28 NOTE — Progress Notes (Signed)
Occupational Therapy Session Note  Patient Details  Name: Katelyn [name] MRN: [id number] Date of Birth: 1950/09/27  Today's Date: 10/28/2019 OT Individual Time: 5859-2924 OT Individual Time Calculation (min): 47 min    Short Term Goals: Week 1:  OT Short Term Goal 1 (Week 1): Pt will complete bathing with min assist with <2 rest breaks to demonstrate increased activity tolerance OT Short Term Goal 2 (Week 1): Pt will complete LB dressing with min assist with <1 rest break to demonstrate increased activity tolerance OT Short Term Goal 3 (Week 1): Pt will complete toileting with min assist  Skilled Therapeutic Interventions/Progress Updates:    Pt worked on dressing sit to stand at the sink during session.  She needed min assist for supine to sit EOB with min assist for stand pivot transfer with use of the RW for support.  Mod assist was needed for donning her pants with min assist for donning her pullover shirt.  She needed max assist for donning her socks as well as donning and tying her shoes.  She was able to cross the LLE over the right knee to assist with donning shoes and socks with min assist to maintain, but could not tolerate crossing the left over the right.  May benefit from AE in the future to assist with this, but will continue to assess.  Finished session with completion of oral hygiene in sitting with setup assist.  Call button and phone in reach with safety belt in place.    Therapy Documentation Precautions:  Precautions Precautions: Fall Precaution Comments: Trach Restrictions Weight Bearing Restrictions: No  Pain: Pain Assessment Pain Scale: Faces Pain Score: 0-No pain ADL: See Care Tool Section for some details of mobility and selfcare   Therapy/Group: Individual Therapy  Katelyn [name] OTR/L 10/28/2019, 12:11 PM

## 2019-10-28 NOTE — Progress Notes (Signed)
Physical Therapy Session Note  Patient Details  Name: Katelyn [name] MRN: [id number] Date of Birth: 05-09-1950  Today's Date: 10/28/2019 PT Individual Time: 9983-3825 and 1300-1339 PT Individual Time Calculation (min): 55 min and 39 min PT Missed Time: 36 minutes due to pain and fatigue  Short Term Goals: Week 1:  PT Short Term Goal 1 (Week 1): pt will perform bed mobility with CGA overall PT Short Term Goal 2 (Week 1): Pt will transfer bed<>WC with LRAD CGA PT Short Term Goal 3 (Week 1): Pt will ambulate 46ft with LRAD CGA  Skilled Therapeutic Interventions/Progress Updates:   Treatment Session 1: 0539-7673 55 min Received pt sitting in [facility] with daughter present at bedside, pt agreeable to therapy, and reported increased R hip pain 6/10 with standing activities. RN notified and administered pain medication during session. Pt on humidified air while in room. Session with emphasis on functional mobility/transfers, generalized strengthening, dynamic standing balance/coordination, and improved activity tolerance. Recreational therapy present during session. Pt transported to in [facility] total A for time management and energy conservation purposes. Stand<>pivot WC<>mat with RW and mod A with cues for turning technique and RW safety as pt pushing RW too far forward. Pt transferred sit<>stand min A without AD and engaged in dynamic standing balance catching ball with rec therapist. Pt with mulitple LOB to L and anteriorly requring mod/max A to prevent fall and reported increased R hip pain and declined any further standing activities. Engaged in dynamic sitting balance playing volleyball 2x10 reps with supervision with emphasis on trunk control and UE ROM/strength. Pt required multiple rest breaks due to increased R hip pain and frequently rubbing hip to decrease pain. Pt requested to toss physioball for increased challenge and performed physioball toss with rec therapist x10 reps. Pt very limited during  today's session due to hip pain but politely declined supine exercises and requested to remain sitting on mat. Pt played horseshoes x 4 trials while sitting on mat with supervision with emphasis on dynamic sitting balance, trunk control, and core strengthening. Pt performed the following exercises sitting on mat with supervision and verbal cues for technique: -bicep curs with 2lb dowel x10 -overhead shoulder press with 2lb dowel x10 -knee extension x10 bilaterally Stand<>pivot mat<>WC with RW and min A and transported back to room in [facility] total A. Pt requested to return to bed and transferred WC<>bed stand<>pivot with RW and min A and sit<>supine with mod A for LE management due to hip pain. Concluded session with pt supine in bed, needs within reach, and bed alarm on. RN aware of pt's current status.   Treatment Session 2: 4193-7902 39 min Received pt supine in bed, pt agreeable to therapy, and reported pain 6/10 in R hip (premedicated). Pt declined any additional pain interventions. Pt on humidified air while in room. Session with emphasis on functional mobility/transfers, generalized strengthening, dynamic sittingstanding balance/coordination, and improved activity tolerance. Pt transferred supine<>sitting EOB with supervision with HOB elevated and use of bedrails and donned shoes sitting EOB with total A. Stand<>pivot bed<>WC without AD and min A. Pt declined ambulating today due to increased hip pain and declined going outside. Pt transported to ortho gym in [facility] total A and performed bilateral UE strengthening on UBE on level 2 for 3 minutes forward before needing rest break due to UE fatigue. Pt then performed additional 1 minute backwards on UBE at level 2. O2 sat 92% and pt asymptomatic. Pt transported to dayroom in [facility] total A and engaged in dynamic sitting  balance Wii bowling with emphasis on trunk control, core strengthening, and improved activity tolerance. Pt reported increased fatigue and requested  to return to room. Pt transported back to room in [facility] total A and transferred WC<>bed stand<>pivot without AD and min A. Doffed shoes with total A and pt transferred sit<>supine with CGA. Concluded session with pt supine in bed, needs within reach, and bed alarm on. 36 minutes missed of skilled physical therapy due to pain and fatigue.   Therapy Documentation Precautions:  Precautions Precautions: Fall Precaution Comments: Trach Restrictions Weight Bearing Restrictions: No  Therapy/Group: Individual Therapy Alfonse Alpers PT, DPT   10/28/2019, 7:27 AM

## 2019-10-28 NOTE — Consult Note (Signed)
[place] Nurse Consult Note: Patient receiving care in [facility] [phone]. Reason for Consult: "Can you please get stoma cover for patient for when she is in the gym?" I spoke with the primary nurse, Norkeshia.  She thinks this order was placed for a tracheostomy stoma cover, which the WOC does not handle.  The patient does not have any type of urinary or fecal stoma.  Val Riles, RN, MSN, CWOCN, CNS-BC, pager [phone]

## 2019-10-29 ENCOUNTER — Inpatient Hospital Stay (HOSPITAL_COMMUNITY): Payer: Medicare Other | Admitting: Physical Therapy

## 2019-10-29 ENCOUNTER — Inpatient Hospital Stay (HOSPITAL_COMMUNITY): Payer: Medicare Other

## 2019-10-29 NOTE — Progress Notes (Signed)
Physical Therapy Session Note  Patient Details  Name: Katelyn [name] MRN: [id number] Date of Birth: [birth year]  Today's Date: 10/29/2019 PT Individual Time: 6546-5035 and 800-900 PT Individual Time Calculation (min): 45 min  And 60 min  Short Term Goals: Week 1:  PT Short Term Goal 1 (Week 1): pt will perform bed mobility with CGA overall PT Short Term Goal 2 (Week 1): Pt will transfer bed<>WC with LRAD CGA PT Short Term Goal 3 (Week 1): Pt will ambulate 73ft with LRAD CGA Week 2:    Week 3:     Skilled Therapeutic Interventions/Progress Updates:  First session:    PAIN denies pain this am  Pt initially supine and agreeable to treatment.  Requests assist w/dressing.  Supine to sit at eob w/cga, mild post tendency.  Scoots w/mod assist.   In sitting pt doffs gown w/cga to min assist for balance due to post tendency. Dons clean shirt w/set up and min assist, assist to manage trach collar. Requires max assist to don pants to hips, difficulty threading feet.  Max assist for donning shoes, min assist and cues for sitting balance. Pt returned supine w/mod assist.  Rolls L/R using rails w/supervision, mod assist to raise pants fully.  Pt rested 2 min then returned to sitting w/supervision using rails.  Scoots to edge w/mod assist.  SPT to wc w/RW and cga, cues to fully turn and for safe hand placement.  Pt transported to gym for continued session  Gait 78ft x 2 w/RW and cga, maintains ER of R hip and step to gait pattern, cues to maintain safe distance within walker and for full turning/positioinig w/transfer to chair/wc.  Gait 77ft x 2 w/RW to/from Nustep.  SPT to Nustep w/cues for safety/hand placment.   Nustep x 6 min w/exts x 4 for cardiovascular endurance.   Pt transported back to room  SPT wc to bed w/min assist, no AD, use of bedrails.  Doffs shoes w/min assist for balance, mod assist to remove. Sit to supine w/mod assist for LE magment. Pt left supine w/rails up x 3, alarm set,  bed in lowest position, and needs in reach.     PM Session: PAIN  Denies pain this pm  Pt initially supine and agreeable to session but states she feels "tired"  Requested therapist assist w/suction using yonker to clear secretions from superficial larengectomy site. Supine to sit w/cga, scoots w/cga SPT bed to wc w/RW w/cga.  Pt transported to gym for continued session.  Standing tolerance/balance activity: Pt stood w/single ue support, occasional no UE support and performed baskeball shot w/hoop at third then second height setting w/cga for balance.  3-5 reps at time w/seated breaks   Gait 31ft/turn/sit to mat as above.  In sitting then in standing w/min assist for balance pt performed basketball dribbling activity using RUE, no AD, legs contacting mat posteriorly for stability.  Bouts of 1-2 min performed for endurance/standing tolerance/balance. Repeated 6x alternating position.  Pt transported back to room and left oob in wc awaiting OT session.  Pt left oob in wc w/alarm belt set and needs in reach   Therapy Documentation Precautions:  Precautions Precautions: Fall Precaution Comments: Trach Restrictions Weight Bearing Restrictions: No    Therapy/Group: Individual Therapy  Callie Fielding, [place] 10/29/2019, 4:09 PM

## 2019-10-29 NOTE — Progress Notes (Signed)
Occupational Therapy Session Note  Patient Details  Name: Katelyn [name] MRN: [id number] Date of Birth: 16-Sep-1950  Today's Date: 10/29/2019 OT Individual Time: 4562-5638 and 9373-4287 OT Individual Time Calculation (min): 61 min and 28 min   Short Term Goals: Week 1:  OT Short Term Goal 1 (Week 1): Pt will complete bathing with min assist with <2 rest breaks to demonstrate increased activity tolerance OT Short Term Goal 2 (Week 1): Pt will complete LB dressing with min assist with <1 rest break to demonstrate increased activity tolerance OT Short Term Goal 3 (Week 1): Pt will complete toileting with min assist   Skilled Therapeutic Interventions/Progress Updates:    Pt greeted at time of session reclined in bed supine resting, trach connected but no air flow, continuous pulse ox as well. Supine to sit EOB with Min A, SPT to wheelchair with Min A with RW. Transported to gym via wheelchair for time management, set up at [facility] to perform in standing, Min A for sit to stand at [facility] but pt became frustrated with continuous pulse ox cord on her R hand and returned to sitting. D/t frustration, located nurse to remove continuous monitor which was completed. Monitored O2 throughout remainder of session, remained 94% or higher on room air. SPT wheelchair to mat with CGA/Min with RW, cues for upright head and to fully pivot on BLEs as well as reaching back for safety. Dynamic seated activity with step added at feet for height for comfort, 3x20 ball toss for "volleyball" as the pt enjoys this and used to play to improve endurance and core strength. SPT back to wheelchair CGA with RW, transported back to room via wheelchair, SPT to bed CGA and sit to supine Min A to manage BLEs from fatigue. Alarm on, call bell in reach.   Session 2: Pt greeted at time of session sitting up in wheelchair agreeable to OT session. Brought to gym via wheelchair for time management and pt performed 2 rounds of dynamic  standing at [employer] with unilateral support on AD to improve standing balance and tolerance, found one 2 mins, round two 1 min w/ minimal reports of fatigue. O2 remained 93% or higher. Discussed assistance from sister as well when returning home for meal prep. Brought back to room via wheelchair and set up with alarm on, call bell in reach.   Therapy Documentation Precautions:  Precautions Precautions: Fall Precaution Comments: Trach Restrictions Weight Bearing Restrictions: No     Therapy/Group: Individual Therapy  Viona Gilmore 10/29/2019, 11:05 AM

## 2019-10-29 NOTE — Progress Notes (Signed)
[facility] PHYSICAL MEDICINE & REHABILITATION PROGRESS NOTE   Subjective/Complaints:   Pt reports SOB "muh better"- is currently on O2 by Trach- 29% FiO2-  Also using tylenol for R hip and R low back pain-  LBM yesterday-    ROS:  Pt denies SOB, abd pain, CP, N/V/C/D, and vision changes   Objective:   No results found. No results for input(s): WBC, HGB, HCT, PLT in the last 72 hours. No results for input(s): NA, K, CL, CO2, GLUCOSE, BUN, CREATININE, CALCIUM in the last 72 hours.  Intake/Output Summary (Last 24 hours) at 10/29/2019 1339 Last data filed at 10/29/2019 1230 Gross per 24 hour  Intake 218 ml  Output --  Net 218 ml     Physical Exam: Vital Signs Blood pressure (!) 139/96, pulse 62, temperature 97.7 F (36.5 C), temperature source Oral, resp. rate 18, height 4\' 11"  (1.499 m), weight 62.5 kg, SpO2 94 %.   General: sitting up in bed- RN and NT in room, appropriate, NAD HEENT: open stoma of trach with O2 via trach stoma (doesn't have trach collar)  in place FiO2 of 28% Neck: Supple without JVD or lymphadenopathy Heart: borderline bradycardia regular rhythm Chest: CTA B/L- slightly decreased at bases B/L Abdomen: Soft, NT, ND, (+)BS  Extremities: No clubbing, cyanosis, or edema. Pulses are 2+ Skin: Moisture and slightly raw underneath breasts- no actual rash seen- no change Has hyperpigmentation rash on L side of neck and B/left knees Neuro: Ox3 Cranial nerves 2-12 are intact. UEs- 5-/5 in biceps, triceps; and 4+/5 in WE< grip and finger abd B/L LEs- HF 3+/5, KE 4-/5, DF and PF 4+/5  Musculoskeletal: Full ROM, No pain with AROM or PROM in the neck, trunk, or extremities. Posture appropriate Psych: appropriate      Assessment/Plan: 1. Functional deficits secondary to debility which require 3+ hours per day of interdisciplinary therapy in a comprehensive inpatient rehab setting.  Physiatrist is providing close team supervision and 24 hour management of active  medical problems listed below.  Physiatrist and rehab team continue to assess barriers to discharge/monitor patient progress toward functional and medical goals  Care Tool:  Bathing  Bathing activity did not occur: Refused Body parts bathed by patient: Right arm, Left arm, Chest, Abdomen, Front perineal area, Buttocks, Right upper leg, Left upper leg, Face   Body parts bathed by helper: Right lower leg, Left lower leg     Bathing assist Assist Level: Minimal Assistance - Patient > 75%     Upper Body Dressing/Undressing Upper body dressing Upper body dressing/undressing activity did not occur (including orthotics): Refused What is the patient wearing?: Pull over shirt    Upper body assist Assist Level: Minimal Assistance - Patient > 75%    Lower Body Dressing/Undressing Lower body dressing    Lower body dressing activity did not occur: Refused What is the patient wearing?: Pants, Incontinence brief     Lower body assist Assist for lower body dressing: Moderate Assistance - Patient 50 - 74%     Toileting Toileting    Toileting assist Assist for toileting: Minimal Assistance - Patient > 75%     Transfers Chair/bed transfer  Transfers assist     Chair/bed transfer assist level: Minimal Assistance - Patient > 75%     Locomotion Ambulation   Ambulation assist      Assist level: Moderate Assistance - Patient 50 - 74% Assistive device: Walker-rolling Max distance: 53ft   Walk 10 feet activity   Assist  Assist level: Moderate Assistance - Patient - 50 - 74% Assistive device: Walker-rolling   Walk 50 feet activity   Assist Walk 50 feet with 2 turns activity did not occur: Safety/medical concerns (decreased balance/postural control, generalized weakness, poor endurance)         Walk 150 feet activity   Assist Walk 150 feet activity did not occur: Safety/medical concerns (decreased balance/postural control, generalized weakness, poor  endurance)         Walk 10 feet on uneven surface  activity   Assist Walk 10 feet on uneven surfaces activity did not occur: Safety/medical concerns (decreased balance/postural control, generalized weakness, poor endurance)         Wheelchair     Assist Will patient use wheelchair at discharge?: Yes Type of Wheelchair: Manual    Wheelchair assist level: Supervision/Verbal cueing Max wheelchair distance: 78ft    Wheelchair 50 feet with 2 turns activity    Assist        Assist Level: Moderate Assistance - Patient 50 - 74%   Wheelchair 150 feet activity     Assist      Assist Level: Total Assistance - Patient < 25%   Blood pressure (!) 139/96, pulse 62, temperature 97.7 F (36.5 C), temperature source Oral, resp. rate 18, height 4\' 11"  (1.499 m), weight 62.5 kg, SpO2 94 %.  Medical Problem List and Plan: 1.  Debility secondary to B/L pneumonia - prolonged hospitalization with hx of laryngectomy and chronic trach             -patient may  Shower- be careful with open trach             -ELOS/Goals: 7-10 days- mod I to supervision  -Continue CIR 2.  H/o DVT/Antithrombotics: -anticoagulation:  Pharmaceutical: Other (comment)--on Eliquis             -antiplatelet therapy: will determine if needs 3. Pain Management: Denies pain 4. Mood: LCSW to follow for evaluation and support.              -antipsychotic agents: N/A 5. Neuropsych: This patient is capable of making decisions on her own behalf. 6. Skin/Wound Care: Routine pressure relief measures.   9/4- nystain cream underneath breasts for moisture 7. Fluids/Electrolytes/Nutrition: Monitor I/O. Lytes stable 9/1 8. RLL HCAP: Cefepime completed 08/31.  9. COPD: Recent exacerbations--has intermittent tachypnea with RR in mid 20's. Nebs weaned down to bid now. Was not on O2 when saw this afternoon, but is needing sometimes with exertion. Currently breathing well on trach collar. Daily RT care 10. Pre-renal  azotemia: Encourage fluid intake. Repeat labs in am.   9/4- last labs show BUN down to 24 and Cr <1 11. Leucocytosis: Resolving 13.7-->10.9. Monitor for signs of infection.  9/4- WBC 6.9  12. Mild anemia: Recheck labs in am. Monitor for signs of bleeding.  13. HTN: Monitor BP tid--has been labile. Continue metoprolol bid.   9/4- BP has borderline high DBP- and SBP- con't regimen  14. Insomnia: melatonin prn ordered    LOS: 4 days A FACE TO FACE EVALUATION WAS PERFORMED  Essam Lowdermilk 10/29/2019, 1:39 PM

## 2019-10-30 NOTE — Plan of Care (Signed)
°  Problem: Consults Goal: RH GENERAL PATIENT EDUCATION Description: See Patient Education module for education specifics. Outcome: Progressing   Problem: RH BOWEL ELIMINATION Goal: RH STG MANAGE BOWEL WITH ASSISTANCE Description: STG Manage Bowel with Assistance. Outcome: Progressing   Problem: RH BLADDER ELIMINATION Goal: RH STG MANAGE BLADDER WITH ASSISTANCE Description: STG Manage Bladder With Assistance Outcome: Progressing   Problem: RH SKIN INTEGRITY Goal: RH STG SKIN FREE OF INFECTION/BREAKDOWN Outcome: Progressing   Problem: RH SAFETY Goal: RH STG ADHERE TO SAFETY PRECAUTIONS W/ASSISTANCE/DEVICE Description: STG Adhere to Safety Precautions With Assistance/Device. Outcome: Progressing   Problem: RH KNOWLEDGE DEFICIT GENERAL Goal: RH STG INCREASE KNOWLEDGE OF SELF CARE AFTER HOSPITALIZATION Outcome: Progressing

## 2019-10-30 NOTE — Significant Event (Signed)
Rapid Response Event Note   Reason for Call :  Called d/t respiratory distress.  On my arrival, bedside RN had just removed something from around trach site. This ??mucus plug?? was occluding site. After this was removed, respiratory distress improved.   Initial Focused Assessment:  Pt laying in bed with eyes opened, mildly tachypneic. Pt is alert and oriented. Pt nods when asked if her breathing is better. Lungs clear t/o, diminished in the bases. Skin warm and dry.  T/o my exam, pt's tachypnea/distress further improved. T-98, HR-62, BP-153/83, RR-18, SpO2-98% on .28 TC.   Interventions:  No RRT interventions  Plan of Care:  Pt is back to baseline per nursing staff. Continue to monitor pt closely. Call RRT if further assistance needed.    Event Summary:   MD Notified:  Call [id number] Arrival [id number] End [id number]  Dillard Essex, RN

## 2019-10-30 NOTE — Progress Notes (Signed)
Assigned RN informed Probation officer to call RT unavailable Rapid Response, RN notified Progress

## 2019-10-30 NOTE — Progress Notes (Signed)
[facility] PHYSICAL MEDICINE & REHABILITATION PROGRESS NOTE   Subjective/Complaints:   Pt reports doing well- fine- no issues- LBM was yesterday per pt.     ROS:   Pt denies SOB, abd pain, CP, N/V/C/D, and vision changes  Objective:   No results found. No results for input(s): WBC, HGB, HCT, PLT in the last 72 hours. No results for input(s): NA, K, CL, CO2, GLUCOSE, BUN, CREATININE, CALCIUM in the last 72 hours.  Intake/Output Summary (Last 24 hours) at 10/30/2019 1254 Last data filed at 10/30/2019 0800 Gross per 24 hour  Intake 20 ml  Output --  Net 20 ml     Physical Exam: Vital Signs Blood pressure (!) 146/73, pulse (!) 57, temperature 98.4 F (36.9 C), temperature source Oral, resp. rate 18, height 4\' 11"  (1.499 m), weight 62.5 kg, SpO2 94 %.   General: laying in bed- appropriate, mouthing words as usual, NAD HEENT: open stoma of trach with O2 via trach stoma (doesn't have trach collar)  in place FiO2 of 28%- sats 92% today Neck: Supple without JVD or lymphadenopathy Heart: mild bradycardia- regular rhythm Chest: CTA B/L- no W/R/R- good air movement- suprisingly sounded good Abdomen: Soft, NT, ND, (+)BS   Extremities: No clubbing, cyanosis, or edema. Pulses are 2+ Skin: Moisture and slightly raw underneath breasts- no actual rash seen- no change Has hyperpigmentation rash on L side of neck and B/left knees Neuro: Ox3 Cranial nerves 2-12 are intact. UEs- 5-/5 in biceps, triceps; and 4+/5 in WE< grip and finger abd B/L LEs- HF 3+/5, KE 4-/5, DF and PF 4+/5  Musculoskeletal: Full ROM, No pain with AROM or PROM in the neck, trunk, or extremities. Posture appropriate Psych: appropriate      Assessment/Plan: 1. Functional deficits secondary to debility which require 3+ hours per day of interdisciplinary therapy in a comprehensive inpatient rehab setting.  Physiatrist is providing close team supervision and 24 hour management of active medical problems listed  below.  Physiatrist and rehab team continue to assess barriers to discharge/monitor patient progress toward functional and medical goals  Care Tool:  Bathing  Bathing activity did not occur: Refused Body parts bathed by patient: Right arm, Left arm, Chest, Abdomen, Front perineal area, Buttocks, Right upper leg, Left upper leg, Face   Body parts bathed by helper: Right lower leg, Left lower leg     Bathing assist Assist Level: Minimal Assistance - Patient > 75%     Upper Body Dressing/Undressing Upper body dressing Upper body dressing/undressing activity did not occur (including orthotics): Refused What is the patient wearing?: Pull over shirt    Upper body assist Assist Level: Minimal Assistance - Patient > 75%    Lower Body Dressing/Undressing Lower body dressing    Lower body dressing activity did not occur: Refused What is the patient wearing?: Pants, Incontinence brief     Lower body assist Assist for lower body dressing: Moderate Assistance - Patient 50 - 74%     Toileting Toileting    Toileting assist Assist for toileting: Minimal Assistance - Patient > 75%     Transfers Chair/bed transfer  Transfers assist     Chair/bed transfer assist level: Contact Guard/Touching assist     Locomotion Ambulation   Ambulation assist      Assist level: Contact Guard/Touching assist Assistive device: Walker-rolling Max distance: 30   Walk 10 feet activity   Assist     Assist level: Contact Guard/Touching assist Assistive device: Walker-rolling   Walk 50 feet activity  Assist Walk 50 feet with 2 turns activity did not occur: Safety/medical concerns         Walk 150 feet activity   Assist Walk 150 feet activity did not occur: Safety/medical concerns (decreased balance/postural control, generalized weakness, poor endurance)         Walk 10 feet on uneven surface  activity   Assist Walk 10 feet on uneven surfaces activity did not occur:  Safety/medical concerns (decreased balance/postural control, generalized weakness, poor endurance)         Wheelchair     Assist Will patient use wheelchair at discharge?: Yes Type of Wheelchair: Manual    Wheelchair assist level: Supervision/Verbal cueing Max wheelchair distance: 27ft    Wheelchair 50 feet with 2 turns activity    Assist        Assist Level: Moderate Assistance - Patient 50 - 74%   Wheelchair 150 feet activity     Assist      Assist Level: Total Assistance - Patient < 25%   Blood pressure (!) 146/73, pulse (!) 57, temperature 98.4 F (36.9 C), temperature source Oral, resp. rate 18, height 4\' 11"  (1.499 m), weight 62.5 kg, SpO2 94 %.  Medical Problem List and Plan: 1.  Debility secondary to B/L pneumonia - prolonged hospitalization with hx of laryngectomy and chronic trach             -patient may  Shower- be careful with open trach             -ELOS/Goals: 7-10 days- mod I to supervision  -Continue CIR 2.  H/o DVT/Antithrombotics: -anticoagulation:  Pharmaceutical: Other (comment)--on Eliquis             -antiplatelet therapy: will determine if needs 3. Pain Management: Denies pain  9/5- continues to deny pain- con't tylenol prn 4. Mood: LCSW to follow for evaluation and support.              -antipsychotic agents: N/A 5. Neuropsych: This patient is capable of making decisions on her own behalf. 6. Skin/Wound Care: Routine pressure relief measures.   9/4- nystain cream underneath breasts for moisture 7. Fluids/Electrolytes/Nutrition: Monitor I/O. Lytes stable 9/1 8. RLL HCAP: Cefepime completed 08/31.   9/5- sounds good this AM- sats 92% on FiO2 28% 9. COPD: Recent exacerbations--has intermittent tachypnea with RR in mid 20's. Nebs weaned down to bid now. Was not on O2 when saw this afternoon, but is needing sometimes with exertion. Currently breathing well on trach collar. Daily RT care 10. Pre-renal azotemia: Encourage fluid intake.  Repeat labs in am.   9/4- last labs show BUN down to 24 and Cr <1 11. Leucocytosis: Resolving 13.7-->10.9. Monitor for signs of infection.  9/4- WBC 6.9  12. Mild anemia: Recheck labs in am. Monitor for signs of bleeding.  13. HTN: Monitor BP tid--has been labile. Continue metoprolol bid.   9/4- BP has borderline high DBP- and SBP- con't regimen   9/5- SBP 146- a little better- con't meds 14. Insomnia: melatonin prn ordered    LOS: 5 days A FACE TO FACE EVALUATION WAS PERFORMED  Raeonna Milo 10/30/2019, 12:54 PM

## 2019-10-31 ENCOUNTER — Inpatient Hospital Stay (HOSPITAL_COMMUNITY): Payer: Medicare Other | Admitting: Occupational Therapy

## 2019-10-31 ENCOUNTER — Inpatient Hospital Stay (HOSPITAL_COMMUNITY): Payer: Medicare Other

## 2019-10-31 LAB — BASIC METABOLIC PANEL
Anion gap: 10 (ref 5–15)
BUN: 13 mg/dL (ref 8–23)
CO2: 24 mmol/L (ref 22–32)
Calcium: 8.9 mg/dL (ref 8.9–10.3)
Chloride: 107 mmol/L (ref 98–111)
Creatinine, Ser: 0.89 mg/dL (ref 0.44–1.00)
GFR calc Af Amer: >60 mL/min (ref >60)
GFR calc non Af Amer: >60 mL/min (ref >60)
Glucose, Bld: 81 mg/dL (ref 70–99)
Potassium: 4.4 mmol/L (ref 3.5–5.1)
Sodium: 141 mmol/L (ref 135–145)

## 2019-10-31 LAB — CBC
HCT: 33.7 % — ABNORMAL LOW (ref 36.0–46.0)
Hemoglobin: 9.9 g/dL — ABNORMAL LOW (ref 12.0–15.0)
MCH: 27.9 pg (ref 26.0–34.0)
MCHC: 29.4 g/dL — ABNORMAL LOW (ref 30.0–36.0)
MCV: 94.9 fL (ref 80.0–100.0)
Platelets: 156 10*3/uL (ref 150–400)
RBC: 3.55 MIL/uL — ABNORMAL LOW (ref 3.87–5.11)
RDW: 13.7 % (ref 11.5–15.5)
WBC: 4.7 10*3/uL (ref 4.0–10.5)
nRBC: 0 % (ref 0.0–0.2)

## 2019-10-31 MED ORDER — GUAIFENESIN-DM 100-10 MG/5ML PO SYRP
15.0000 mL | ORAL_SOLUTION | Freq: Four times a day (QID) | ORAL | Status: DC
  Administered 2019-10-31 – 2019-11-05 (×20): 15 mL via ORAL
  Filled 2019-10-31 (×20): qty 15

## 2019-10-31 NOTE — Progress Notes (Signed)
Physical Therapy Session Note  Patient Details  Name: Katelyn [name] MRN: [id number] Date of Birth: [birth year]  Today's Date: 10/31/2019 PT Individual Time: 1660-6301 and 1300-1355 PT Individual Time Calculation (min): 20 min and 55 min Today's Date: 10/31/2019 PT Missed Time: 25 Minutes Missed Time Reason: Patient ill (Comment);Patient fatigue (trach clogged waiting on respiratory)  Short Term Goals: Week 1:  PT Short Term Goal 1 (Week 1): pt will perform bed mobility with CGA overall PT Short Term Goal 2 (Week 1): Pt will transfer bed<>WC with LRAD CGA PT Short Term Goal 3 (Week 1): Pt will ambulate 64ft with LRAD CGA  Skilled Therapeutic Interventions/Progress Updates:   Treatment Session 1: 1017-1037 20 min Received pt being assisted back to bed with PT Pamala Hurry. Per PT, pt with recent episodes of difficulty breathing due to trach getting clogged with secretions. MD aware and notified respiratory to come assess pt. Therapist confirmed this information with RN. Pt reported feeling terrified that she couldn't breathe earlier but felt better now that she was resting in bed. O2 sat 97%. Pt politely declined OOB mobility but agreeable to let therapist provide HEP. Therapist provided HEP consisting of the following exercises and educated pt on frequency/duration/technique: Exercises -Mini Squat with Counter Support - 1 x daily - 7 x weekly - 2 sets - 10 reps -Heel rises with counter support - 1 x daily - 7 x weekly - 3 sets - 12 reps -Standing Hip Abduction with Counter Support - 1 x daily - 7 x weekly - 2 sets - 10 reps -Standing March with Counter Support - 1 x daily - 7 x weekly - 2 sets - 10 reps -Seated Long Arc Quad - 1 x daily - 7 x weekly - 2 sets - 10 reps Concluded session with pt supine in bed, needs within reach, and bed alarm on. 25 minutes missed of skilled physical therapy due to fatigue and trach getting clogged; waiting on respiratory.   Treatment Session 2: 6010-9323 55  min Received pt supine in bed stating respiratory still hasn't come. Pt agreeable to therapy but requested to remain in the room and hooked up to oxygen and humidified air, pt denied any pain during session. O2 sat dropped to 82% with standing exercises but recovered to 94% in <30 seconds. Session with emphasis on functional mobility/transfers, generalized strengthening, dynamic standing balance/coordination, and improved activity tolerance. Pt performed the following exercises supine in bed with supervision and verbal cues for technique: -heel slides x10 bilaterally -hip abduction x10 bilaterally  -SAQ x10 bilaterally  -hip adduction pillow squeezes x10 with 2 second isometric hold -marching x5 bilaterally (pt reported increased R hip pain with activity) Pt transferred supine<>sitting EOB with HOB elevated and supervision and transferred bed<>WC stand<>pivot without AD with min A and increased time with cues for hand placement and turning technique. Pt performed the following exercises sitting in WC with supervision and verbal cues for technique: -LAQ 2x10 bilaterally  -hip flexion x10 bilaterally  -WC pushups x8 -isometric hip abduction x10 -isometric hamstring curls x10 RN present to check on pt and chest PT device brought to room. Pt transferred sit<>stand with RW and CGA x 3 and performed the following exercises standing with bilateral UE support on RW, CGA, and cues for technique: -heel raises x10 (decreased ankle ROM bilaterally) -marching x10 bilaterally  -mini squats x6 Pt transferred stand<>pivot WC<>bed with RW and CGA and sit<>supine with mod A for LE management. Concluded session with pt supine in bed, needs within  reach, and bed alarm on.   Therapy Documentation Precautions:  Precautions Precautions: Fall Precaution Comments: Trach Restrictions Weight Bearing Restrictions: No   Therapy/Group: Individual Therapy Alfonse Alpers PT, DPT   10/31/2019, 7:20 AM

## 2019-10-31 NOTE — Progress Notes (Signed)
Occupational Therapy Session Note  Patient Details  Name: Meilah Delrosario MRN: [id number] Date of Birth: 05/13/1950  Today's Date: 10/31/2019 OT Individual Time:  -    1150-1215                                            25 minutes   patient missed session at this time due to awaiting respiratory treatment and she states that she is too fatigued at this time to participate.  Offered self care and light activity but she continues to refuse.  Will attempt later this am.   Second attempt made at 1150 to make up time missed from earlier this am - she agreed to participate and able to make up 25 minutes  Short Term Goals: Week 1:  OT Short Term Goal 1 (Week 1): Pt will complete bathing with min assist with <2 rest breaks to demonstrate increased activity tolerance OT Short Term Goal 2 (Week 1): Pt will complete LB dressing with min assist with <1 rest break to demonstrate increased activity tolerance OT Short Term Goal 3 (Week 1): Pt will complete toileting with min assist  Skilled Therapeutic Interventions/Progress Updates:    patient in bed alert and aware of needs.  She denies pain and agrees to sit edge of bed for activities.  Supine to sit with mod A.  Sit to stand from edge of bed with min A/CGA.  She is able to stand for approx 2 minutes each of 5 attempt with weight shift and lateral stepping activity .  Unsupported sitting at edge of bed with CS and UB and trunk mobility activities.  She is pleasant and cooperative.  Sit to supine with mod A.  She remained in bed at close of session with bed alarm set and callbell in hand.    Therapy Documentation Precautions:  Precautions Precautions: Fall Precaution Comments: Trach Restrictions Weight Bearing Restrictions: No   Therapy/Group: Individual Therapy  Carlos Levering 10/31/2019, 7:35 AM

## 2019-10-31 NOTE — Progress Notes (Signed)
[employer] [facility]) Community Based Palliative Care  This patient has been referred to our palliative care services in the community. [facility] will continue to follow for any discharge planning needs and to coordinate continuation of palliative care. If you have questions or need assistance, please call [phone] or contact the hospital Liaison listed on AMION.  Thank you for the opportunity to participate in this patient's care.  Farrel Gordon, RN, [facility] Liaison   [phone]

## 2019-10-31 NOTE — Plan of Care (Signed)
  Problem: Consults Goal: RH GENERAL PATIENT EDUCATION Description: See Patient Education module for education specifics. Outcome: Progressing   Problem: RH BOWEL ELIMINATION Goal: RH STG MANAGE BOWEL WITH ASSISTANCE Description: STG Manage Bowel with Assistance. Outcome: Progressing   Problem: RH BLADDER ELIMINATION Goal: RH STG MANAGE BLADDER WITH ASSISTANCE Description: STG Manage Bladder With Assistance Outcome: Progressing   Problem: RH SKIN INTEGRITY Goal: RH STG SKIN FREE OF INFECTION/BREAKDOWN Outcome: Progressing   Problem: RH SAFETY Goal: RH STG ADHERE TO SAFETY PRECAUTIONS W/ASSISTANCE/DEVICE Description: STG Adhere to Safety Precautions With Assistance/Device. Outcome: Progressing   Problem: RH KNOWLEDGE DEFICIT GENERAL Goal: RH STG INCREASE KNOWLEDGE OF SELF CARE AFTER HOSPITALIZATION Outcome: Progressing

## 2019-10-31 NOTE — Progress Notes (Signed)
Physical Therapy Session Note  Patient Details  Name: Katelyn [name] MRN: [id number] Date of Birth: 23-Jun-1950  Today's Date: 10/31/2019 PT Individual Time: 2395-3202 PT Individual Time Calculation (min): 30 min   Short Term Goals: Week 1:  PT Short Term Goal 1 (Week 1): pt will perform bed mobility with CGA overall PT Short Term Goal 2 (Week 1): Pt will transfer bed<>WC with LRAD CGA PT Short Term Goal 3 (Week 1): Pt will ambulate 23ft with LRAD CGA Week 2:    Week 3:     Skilled Therapeutic Interventions/Progress Updates:    PAIN  Pt denies pain but very upset and expressing fear regarding episode this am involving mucus plug at stoma/ difficulty breathing/difficulty communicating.  MD in during session to discuss detailed plan to address issue  Pt supine to sit on edge of bed w/cga assist and rail.  Scoots w/min assist.  Pt w/mild post/R lean and requires min assist w/balance during dressing.    Pt doffs nightgown in sitting w/min assist for balance.  STS w/min to mod assist from bed/R hip immobility hindering transition.  Once standing, pt able to lower brief w/min assist, returns to sitting w/min assist due to mild post lob.  Repeated STS mult times w/min to occasional mod A,  while pt alternating cleaning perineum in standing, w/set up, raising brief/pants.  In sitting performs upper body bathing and dressing w/set up and lower body w/mod assist and min assist for balance.   Pt declined transfer to chair stating she was exhausted from stress of am.  STS and sidestepping to head of bed w/min assist.  Sit to supine w/cga. Pt left supine w/rails up x 4, alarm set, bed in lowest position, and needs in reach.   Therapy Documentation Precautions:  Precautions Precautions: Fall Precaution Comments: Trach Restrictions Weight Bearing Restrictions: No   Therapy/Group: Individual Therapy  Callie Fielding, [place] 10/31/2019, 12:55 PM

## 2019-10-31 NOTE — Progress Notes (Signed)
RT holding CPT at this time, patient currently with PT. RT will attempt at next scheduled time.

## 2019-10-31 NOTE — Progress Notes (Signed)
[facility] PHYSICAL MEDICINE & REHABILITATION PROGRESS NOTE   Subjective/Complaints: Patient has another mucus plug. Asks why she is getting these so frequently. She did not get at home. She had rapid response yesterday due to respiratory distress from mucus plug. I placed order to request that RT come by daily and added Chest PT to see if his will help minimize plugging. Will increase Robitussin   ROS:   Pt denies SOB, abd pain, CP, N/V/C/D, and vision changes  Objective:   No results found. Recent Labs    10/31/19 0523  WBC 4.7  HGB 9.9*  HCT 33.7*  PLT 156   Recent Labs    10/31/19 0523  NA 141  K 4.4  CL 107  CO2 24  GLUCOSE 81  BUN 13  CREATININE 0.89  CALCIUM 8.9    Intake/Output Summary (Last 24 hours) at 10/31/2019 1151 Last data filed at 10/31/2019 0827 Gross per 24 hour  Intake 100 ml  Output --  Net 100 ml     Physical Exam: Vital Signs Blood pressure (!) 141/71, pulse 60, temperature 97.7 F (36.5 C), resp. rate 16, height 4\' 11"  (1.499 m), weight 58.3 kg, SpO2 97 %. General: Alert and oriented x 3, No apparent distress HEENT: Head is normocephalic, mucus plug at stoma Neck: Supple without JVD or lymphadenopathy Heart: Reg rate and rhythm. No murmurs rubs or gallops Chest: CTA B/L- no W/R/R- good air movement Abdomen: Soft, NT, ND, (+)BS   Extremities: No clubbing, cyanosis, or edema. Pulses are 2+ Skin: Moisture and slightly raw underneath breasts- no actual rash seen- no change Has hyperpigmentation rash on L side of neck and B/left knees Neuro: Ox3 Cranial nerves 2-12 are intact. UEs- 5-/5 in biceps, triceps; and 4+/5 in WE< grip and finger abd B/L LEs- HF 3+/5, KE 4-/5, DF and PF 4+/5  Musculoskeletal: Full ROM, No pain with AROM or PROM in the neck, trunk, or extremities. Posture appropriate Psych: appropriate  Assessment/Plan: 1. Functional deficits secondary to debility which require 3+ hours per day of interdisciplinary therapy in a  comprehensive inpatient rehab setting.  Physiatrist is providing close team supervision and 24 hour management of active medical problems listed below.  Physiatrist and rehab team continue to assess barriers to discharge/monitor patient progress toward functional and medical goals  Care Tool:  Bathing  Bathing activity did not occur: Refused Body parts bathed by patient: Right arm, Left arm, Chest, Abdomen, Front perineal area, Buttocks, Right upper leg, Left upper leg, Face   Body parts bathed by helper: Right lower leg, Left lower leg     Bathing assist Assist Level: Minimal Assistance - Patient > 75%     Upper Body Dressing/Undressing Upper body dressing Upper body dressing/undressing activity did not occur (including orthotics): Refused What is the patient wearing?: Pull over shirt    Upper body assist Assist Level: Minimal Assistance - Patient > 75%    Lower Body Dressing/Undressing Lower body dressing    Lower body dressing activity did not occur: Refused What is the patient wearing?: Pants, Incontinence brief     Lower body assist Assist for lower body dressing: Moderate Assistance - Patient 50 - 74%     Toileting Toileting    Toileting assist Assist for toileting: Minimal Assistance - Patient > 75%     Transfers Chair/bed transfer  Transfers assist     Chair/bed transfer assist level: Contact Guard/Touching assist     Locomotion Ambulation   Ambulation assist  Assist level: Contact Guard/Touching assist Assistive device: Walker-rolling Max distance: 30   Walk 10 feet activity   Assist     Assist level: Contact Guard/Touching assist Assistive device: Walker-rolling   Walk 50 feet activity   Assist Walk 50 feet with 2 turns activity did not occur: Safety/medical concerns         Walk 150 feet activity   Assist Walk 150 feet activity did not occur: Safety/medical concerns (decreased balance/postural control, generalized  weakness, poor endurance)         Walk 10 feet on uneven surface  activity   Assist Walk 10 feet on uneven surfaces activity did not occur: Safety/medical concerns (decreased balance/postural control, generalized weakness, poor endurance)         Wheelchair     Assist Will patient use wheelchair at discharge?: Yes Type of Wheelchair: Manual    Wheelchair assist level: Supervision/Verbal cueing Max wheelchair distance: 69ft    Wheelchair 50 feet with 2 turns activity    Assist        Assist Level: Moderate Assistance - Patient 50 - 74%   Wheelchair 150 feet activity     Assist      Assist Level: Total Assistance - Patient < 25%   Blood pressure (!) 141/71, pulse 60, temperature 97.7 F (36.5 C), resp. rate 16, height 4\' 11"  (1.499 m), weight 58.3 kg, SpO2 97 %.  Medical Problem List and Plan: 1.  Debility secondary to B/L pneumonia - prolonged hospitalization with hx of laryngectomy and chronic trach             -patient may  Shower- be careful with open trach             -ELOS/Goals: 7-10 days- mod I to supervision  -Continue CIR 2.  H/o DVT/Antithrombotics: -anticoagulation:  Pharmaceutical: Other (comment)--on Eliquis             -antiplatelet therapy: will determine if needs 3. Pain Management: Denies pain 4. Mood: LCSW to follow for evaluation and support.              -antipsychotic agents: N/A 5. Neuropsych: This patient is capable of making decisions on her own behalf. 6. Skin/Wound Care: Routine pressure relief measures. Nystain cream underneath breasts for moisture 7. Fluids/Electrolytes/Nutrition: Monitor I/O. Lytes stable 9/1 8. RLL HCAP: Cefepime completed 08/31. Saturating well.  9. COPD/mucus plugging: Recent exacerbations--has intermittent tachypnea with RR in mid 20's. Nebs weaned down to bid now.  Currently breathing well on trach collar. Daily RT care ordered. Increase Robitussin to 88mL q6H. Add chest PT 10. Pre-renal azotemia:  Encourage fluid intake. Repeat labs in am.   9/4- last labs show BUN down to 24 and Cr <1, stable 9/6 11. Leucocytosis: Resolving 13.7-->10.9. Monitor for signs of infection.  9/4- WBC 6.9, stable 9/6 12. Mild anemia: Stable 9/6 13. HTN: Monitor BP tid--has been labile. Continue metoprolol bid.   9/4- BP has borderline high DBP- and SBP- con't regimen   9/5- SBP 146- a little better- con't meds 14. Insomnia: melatonin prn ordered    LOS: 6 days A FACE TO FACE EVALUATION WAS PERFORMED  Martha Clan P Bird Tailor 10/31/2019, 11:51 AM

## 2019-10-31 NOTE — Progress Notes (Signed)
At the beginning of the shift, the nurse tech called for assistance in the room stating that the patient couldn't breathe. When arrived at the room, 2 nurse techs were present, they were giving her hygiene care & changing her bed when it started. Patient was sat up to high fowlers position. Oxygen saturations was in the low 80s & heart rate was in the 120s. When assessed, her trach stoma was covered by thick mucus that was partially dried. Another nurse was asked to call respiratory therapy, while this nurse tried to assist patient. A sterile cotton swab was used to bring the edge out enough to grasp the mucus & pull away from the stoma site. Oxygen was turned up to allow for more oxygenation. Her oxygen saturation improved slightly. She was coaxed to slow down her breathing, take deep breaths & cough when she was able. Oxygen saturation went up to 92% & patient stated that she felt better after a few breathing cycles. The rapid response nurse arrived & assessed the patient. Heart rate went back down to 71 bpm. No acute distress was noted. After the rapid nurse left, the respiratory therapist came & readjusted her oxygen settings. No other episodes noted at this time. Patient was visualized multiple times & showed no signs of distress. She whispers when she talks, but can make her needs known. No c/o pain or discomfort. Will continue to  Monitor for changes

## 2019-11-01 ENCOUNTER — Inpatient Hospital Stay (HOSPITAL_COMMUNITY): Payer: Medicare Other

## 2019-11-01 ENCOUNTER — Inpatient Hospital Stay (HOSPITAL_COMMUNITY): Payer: Medicare Other | Admitting: Occupational Therapy

## 2019-11-01 DIAGNOSIS — I10 Essential (primary) hypertension: Secondary | ICD-10-CM

## 2019-11-01 DIAGNOSIS — J9601 Acute respiratory failure with hypoxia: Secondary | ICD-10-CM

## 2019-11-01 DIAGNOSIS — D649 Anemia, unspecified: Secondary | ICD-10-CM

## 2019-11-01 DIAGNOSIS — G479 Sleep disorder, unspecified: Secondary | ICD-10-CM

## 2019-11-01 DIAGNOSIS — J449 Chronic obstructive pulmonary disease, unspecified: Secondary | ICD-10-CM

## 2019-11-01 NOTE — Progress Notes (Signed)
RT assessment done this AM. Noticed that MD had ordered CPT Q4WA to prevent mucous plugging. Upon assessment, BBS clear and patient resting comfortably. CPT done for 10 minutes, patient is not a fan of this procedure but tolerates well. I feel that BID and PRN treatments would be sufficient in preventing plugs, also I feel that a chest xray should be ordered (there hasn't been on since 8/26) to see if she truly still needs it at all. RT will continue to monitor need for CPT q4 with ATC checks.

## 2019-11-01 NOTE — Progress Notes (Signed)
Occupational Therapy Session Note  Patient Details  Name: Katelyn [name] MRN: [id number] Date of Birth: [birth year]  Today's Date: 11/01/2019 OT Individual Time: 1140-1205 OT Individual Time Calculation (min): 25 min    Short Term Goals: Week 1:  OT Short Term Goal 1 (Week 1): Pt will complete bathing with min assist with <2 rest breaks to demonstrate increased activity tolerance OT Short Term Goal 2 (Week 1): Pt will complete LB dressing with min assist with <1 rest break to demonstrate increased activity tolerance OT Short Term Goal 3 (Week 1): Pt will complete toileting with min assist     Skilled Therapeutic Interventions/Progress Updates:    Pt received in bed sleeping but awoke easily and agreeable to therapy.  Sat EOB S.  For activity tolerance, worked on sit to stands to [employer] with close S and standing tolerance of 45 sec to 1 minute several times. Her O2 sats would drop after 30-40 sec but resume after sitting for 30 sec.  Seated UE and LE AROM exercises.  Pt practiced side stepping to be able to sit close to [facility]  Before scooting around.  Pt positioned in bed with all needs met, alarm set, table set up for lunch.   Therapy Documentation Precautions:  Precautions Precautions: Fall Precaution Comments: Trach Restrictions Weight Bearing Restrictions: No   Vital Signs: Therapy Vitals Pulse Rate: 72 Resp: 18 Patient Position (if appropriate): Lying Oxygen Therapy SpO2: 97 % O2 Device: Tracheostomy Collar O2 Flow Rate (L/min): 5 L/min FiO2 (%): 28 % Pain: Pain Assessment Pain Score: 0-No pain    Therapy/Group: Individual Therapy  [place] 11/01/2019, 12:56 PM

## 2019-11-01 NOTE — Plan of Care (Signed)
  Problem: RH Ambulation Goal: LTG Patient will ambulate in controlled environment (PT) Description: LTG: Patient will ambulate in a controlled environment, # of feet with assistance (PT). Flowsheets (Taken 11/01/2019 0752) LTG: Pt will ambulate in controlled environ  assist needed:: (downgraded due to poor RW safety awareness, decreased endurance, generalized weakness) Contact Guard/Touching assist LTG: Ambulation distance in controlled environment: 22ft with LRAD Note: downgraded due to poor RW safety awareness, decreased endurance, generalized weakness Goal: LTG Patient will ambulate in home environment (PT) Description: LTG: Patient will ambulate in home environment, # of feet with assistance (PT). Flowsheets (Taken 11/01/2019 0752) LTG: Pt will ambulate in home environ  assist needed:: (downgraded due to poor RW safety awareness, decreased endurance, generalized weakness) Contact Guard/Touching assist LTG: Ambulation distance in home environment: 55ft with LRAD Note: downgraded due to poor RW safety awareness, decreased endurance, generalized weakness   Problem: RH Wheelchair Mobility Goal: LTG Patient will propel w/c in controlled environment (PT) Description: LTG: Patient will propel wheelchair in controlled environment, # of feet with assist (PT) Flowsheets (Taken 11/01/2019 0752) LTG: Pt will propel w/c in controlled environ  assist needed:: (downgraded due to decreased endurance and generalized weakness) Supervision/Verbal cueing LTG: Propel w/c distance in controlled environment: 40ft Note: downgraded due to decreased endurance and generalized weakness Goal: LTG Patient will propel w/c in home environment (PT) Description: LTG: Patient will propel wheelchair in home environment, # of feet with assistance (PT). Flowsheets (Taken 11/01/2019 0752) LTG: Pt will propel w/c in home environ  assist needed:: (downgraded due to decreased endurance and generalized weakness) Supervision/Verbal  cueing LTG: Propel w/c distance in home environment: 31ft Note: downgraded due to decreased endurance and generalized weakness

## 2019-11-01 NOTE — Progress Notes (Signed)
Physical Therapy Note  Patient Details  Name: Katelyn [name] MRN: [id number] Date of Birth: 08-24-1950 Today's Date: 11/01/2019  Recommending the following equipment to increase functional independence with mobility: ~ 16x16 manual WC with regular legrests   Patient measurements:    Hip Width: 15in    Seat Depth (-2"): 15in    Back Height: 16in    W/c cushion: 16x16 foam cushion   Alfonse Alpers PT, DPT   11/01/2019, 10:36 AM

## 2019-11-01 NOTE — Progress Notes (Signed)
Physical Therapy Session Note  Patient Details  Name: Katelyn [name] MRN: [id number] Date of Birth: [birth year]  Today's Date: 11/01/2019 PT Individual Time: 1000-1056 and 1445-1523  PT Individual Time Calculation (min): 56 min and 38 min  Short Term Goals: Week 1:  PT Short Term Goal 1 (Week 1): pt will perform bed mobility with CGA overall PT Short Term Goal 1 - Progress (Week 1): Met PT Short Term Goal 2 (Week 1): Pt will transfer bed<>WC with LRAD CGA PT Short Term Goal 2 - Progress (Week 1): Met PT Short Term Goal 3 (Week 1): Pt will ambulate 51f with LRAD CGA PT Short Term Goal 3 - Progress (Week 1): Progressing toward goal Week 2:  PT Short Term Goal 1 (Week 2): STG=LTG due to LOS  Skilled Therapeutic Interventions/Progress Updates:  Treatment Session 1: 1000-1056 56 min Received pt supine in bed, pt agreeable to therapy, and denied any pain during session. Discussed with RN humidified air status and whether or not pt required oxygen support when leaving room. Session with emphasis on functional mobility/transfers, generalized strengthening, dynamic standing balance/coordination, ambulation, and improved activity tolerance. Pt performed bed mobility with HOB elevated and supervision and transferred bed<>WC stand<>pivot without AD and CGA/min A with cues for hand placement. Pt performed WC mobility 748fusing bilateral UEs and supervision and transported remainder of way to therapy gym [facility] for energy conservation purposes. O2 sat 91% and HR 55 bpm; pt asymptomatic. Pt ambulated 3198f 1 and [id number] with RW and CGA. Pt continues to demonstrate R hip ER, decreased trunk rotation, flexed trunk, and poor RW safety awareness with gait and turns. O2 sat after ambulation 93% on RA and pt asymptomatic. Pt ambulated additional 5ft 70fh RW CGA to WC [facility] Hospitalthe[facility] took measurements to fit pt for WC. Attempted to locate 16x16 manual WC for improved fit and comfort however, none available.  [facility] and transferred WC<>bed stand<>pivot without AD and min A and sit<>supine with HOB elevated and use of bedrails with supervision. Pt required max A to scoot to HOB. [facility] with pt supine in bed, needs within each, and bed alarm on. Pt attached back to humidified air and O2 sat 100%.   Treatment Session 2: 1445-1523 38 min Received pt supine in bed, pt initially reporting feeling too fatigued to participate in therapy but with convincing was agreeable to therapy at bedside. Pt denied any pain during session. Session with emphasis on functional mobility/transfers, generalized strengthening, dynamic standing balance/coordination, and improved activity tolerance. O2 sat dropped to 80% with activity but increased to 98% in <15 seconds with seated rest breaks; pt asymptomatic. Pt transferred supine<>sitting EOB with HOB elevated and use of bedrails with supervision. Pt performed the following exercises sitting EOB with supervision and verbal cues for technique: -LAQ x15 bilaterally -hip flexion x15 bilaterally -hip adduction pillow squeezes x15 Pt transferred sit<>stand with RW and close supervision x 3 trials and performed the following exercises with bilateral UE support on RW, CGA, and verbal cues for technique: -hip abduction x10 bilaterally -mini squats 2x5 -hip flexion x10 bilaterally  Pt transferred sit<>supine with supervision and required total A to scoot to HOB [facility] Marylandoptimal position for breathing. Concluded session with pt supine in bed, needs within reach, and bed alarm on.  Therapy Documentation Precautions:  Precautions Precautions: Fall Precaution Comments: Trach Restrictions Weight Bearing Restrictions: No  Therapy/Group: Individual Therapy Janessa Mickle Betsey HolidaysLyn Hollingshead  PT, DPT   11/01/2019, 7:35 AM

## 2019-11-01 NOTE — Progress Notes (Addendum)
Patient ID: Katelyn [name], female   DOB: 1950/04/28, 69 y.o.   MRN: [id number] Have made referral to [employer] for 16 x 16 wheelchair, according to [facility] they have them right now. Have placed order for delivery 9/10. Pt very tired today form bad day yesterday and respiratory issues.  2:09 PM [employer] reports pt has had a wheelchair purchased for her in 2020. According to granddaughter they turned this in and thought it was still being rented. May not be able to get a wheelchair unless pays privately.

## 2019-11-01 NOTE — Progress Notes (Signed)
Irregular heartrate noted throughout the day, patient is now fluctuating between high 50s and low 60s. No s/s of distress noted at this time.  Audie Clear, LPN

## 2019-11-01 NOTE — Progress Notes (Signed)
Physical Therapy Weekly Progress Note  Patient Details  Name: Katelyn [name] MRN: [id number] Date of Birth: 08-21-1950  Beginning of progress report period: October 26, 2019 End of progress report period: November 01, 2019  Patient has met 2 of 3 short term goals. Pt is making gradual progress towards long term goals. Pt currently requires CGA/min A for bed mobility, CGA/min A for transfers with and without RW, min A to ambulate 30f with RW, and supervision for WC mobility up to 737f Pt with recent episodes of difficulty breathing due to trach getting clogged with secretions limiting pt's participation in therapy. Pt also continues to be limited by decreased endurance and poor safety awareness with RW.   Patient continues to demonstrate the following deficits muscle weakness, decreased cardiorespiratoy endurance and decreased oxygen support, impaired timing and sequencing, decreased coordination and decreased motor planning and decreased standing balance, decreased postural control and decreased balance strategies and therefore will continue to benefit from skilled PT intervention to increase functional independence with mobility.  Patient progressing toward long term goals..  Continue plan of care.  PT Short Term Goals Week 1:  PT Short Term Goal 1 (Week 1): pt will perform bed mobility with CGA overall PT Short Term Goal 1 - Progress (Week 1): Met PT Short Term Goal 2 (Week 1): Pt will transfer bed<>WC with LRAD CGA PT Short Term Goal 2 - Progress (Week 1): Met PT Short Term Goal 3 (Week 1): Pt will ambulate 2541fith LRAD CGA PT Short Term Goal 3 - Progress (Week 1): Progressing toward goal Week 2:  PT Short Term Goal 1 (Week 2): STG=LTG due to LOS  Skilled Therapeutic Interventions/Progress Updates:  Ambulation/gait training;Discharge planning;Functional mobility training;Psychosocial support;Therapeutic Activities;Balance/vestibular training;Disease  management/prevention;Neuromuscular re-education;Therapeutic Exercise;Wheelchair propulsion/positioning;DME/adaptive equipment instruction;Pain management;Splinting/orthotics;UE/LE Strength taining/ROM;Community reintegration;Functional electrical stimulation;Patient/family education;Stair training;UE/LE Coordination activities   Therapy Documentation Precautions:  Precautions Precautions: Fall Precaution Comments: Trach Restrictions Weight Bearing Restrictions: No  AnnAlfonse Alpers, DPT  11/01/2019, 7:33 AM

## 2019-11-01 NOTE — Plan of Care (Signed)
°  Problem: RH Dressing Goal: LTG Patient will perform lower body dressing w/assist (OT) Description: LTG: Patient will perform lower body dressing with assist, with/without cues in positioning using equipment (OT) Flowsheets (Taken 11/01/2019 0853) LTG: Pt will perform lower body dressing with assistance level of: (downgraded due to decreased endurance) Minimal Assistance - Patient > 75% Note: Downgraded due to decreased endurance   Problem: RH Toilet Transfers Goal: LTG Patient will perform toilet transfers w/assist (OT) Description: LTG: Patient will perform toilet transfers with assist, with/without cues using equipment (OT) Flowsheets (Taken 11/01/2019 0853) LTG: Pt will perform toilet transfers with assistance level of: (downgraded due to decreased endurance and poor RW safety awareness) Contact Guard/Touching assist Note: downgraded due to decreased endurance and poor RW safety awareness

## 2019-11-01 NOTE — Progress Notes (Signed)
Occupational Therapy Session Note  Patient Details  Name: Katelyn [name] MRN: [id number] Date of Birth: 1950/11/07  Today's Date: 11/01/2019 OT Individual Time: 3704-8889 OT Individual Time Calculation (min): 55 min    Short Term Goals: Week 1:  OT Short Term Goal 1 (Week 1): Pt will complete bathing with min assist with <2 rest breaks to demonstrate increased activity tolerance OT Short Term Goal 2 (Week 1): Pt will complete LB dressing with min assist with <1 rest break to demonstrate increased activity tolerance OT Short Term Goal 3 (Week 1): Pt will complete toileting with min assist  Skilled Therapeutic Interventions/Progress Updates:    Treatment session with focus on activity tolerance during self-care retraining tasks.  Pt received in supported position in bed with trach collar and O2 on.  Pt agreeable to bathing, but requested to complete from EOB due to oxygen and continued from fatigue from events of last few days.  Pt completed bed mobility with close supervision.  Engaged in bathing at sit > stand level from EOB with CGA when standing to wash buttocks.  Pt completed LB dressing, able to don pullup with close supervision, but required physical assistance when treading pants. Therapist assisted with pulling pants over hips due to fatigue and urgent need to sit.  Engaged in sidestepping towards [facility] with RW with CGA.  Pt returned to supine with CGA and increased time.  O2 sats dropped to 83% during dynamic standing and mobility, but quickly returns to mid 90s upon seated rest.  Therapist provided pt with energy conservation handouts and discussed 4 P's of energy conservation.  Pt remained semi-reclined in bed with all needs in reach.    Therapy Documentation Precautions:  Precautions Precautions: Fall Precaution Comments: Trach Restrictions Weight Bearing Restrictions: No General:   Vital Signs: Therapy Vitals Temp: 97.9 F (36.6 C) Pulse Rate: 61 Resp: 18 BP:  136/86 Patient Position (if appropriate): Lying Oxygen Therapy SpO2: 95 % O2 Device: Room Air Pain:   ADL: ADL Lower Body Dressing: Moderate assistance Toileting: Moderate assistance Toilet Transfer: Moderate assistance Toilet Transfer Method: Ambulating (with RW) Toilet Transfer Equipment: Grab bars Vision   Perception    Praxis   Exercises:   Other Treatments:     Therapy/Group: Individual Therapy  Farhiya Rosten, F[place] 11/01/2019, 8:30 AM

## 2019-11-01 NOTE — Progress Notes (Signed)
[place] PHYSICAL MEDICINE & REHABILITATION PROGRESS NOTE   Subjective/Complaints: Patient seen sitting up in bed this morning.  She indicates she slept well overnight.  She wants to confirm her discharge date.  She notes chest PT shakes her.  ROS: Denies CP, SOB, N/V/D  Objective:   No results found. Recent Labs    10/31/19 0523  WBC 4.7  HGB 9.9*  HCT 33.7*  PLT 156   Recent Labs    10/31/19 0523  NA 141  K 4.4  CL 107  CO2 24  GLUCOSE 81  BUN 13  CREATININE 0.89  CALCIUM 8.9    Intake/Output Summary (Last 24 hours) at 11/01/2019 0857 Last data filed at 10/31/2019 1700 Gross per 24 hour  Intake 80 ml  Output --  Net 80 ml     Physical Exam: Vital Signs Blood pressure 136/86, pulse 61, temperature 97.9 F (36.6 C), resp. rate 18, height 4\' 11"  (1.499 m), weight 61.4 kg, SpO2 96 %. Constitutional: No distress . Vital signs reviewed. HENT: Normocephalic.  Atraumatic. Neck: + Stoma.  + Trach collar. Eyes: EOMI. No discharge. Cardiovascular: No JVD.  RRR. Respiratory: Normal effort.  No stridor.  Bilateral clear to auscultation. GI: Non-distended.  BS +. Skin: Warm and dry.  Intact. Psych: Normal mood.  Normal behavior. Musc: No edema in extremities.  No tenderness in extremities. Neuro: Alert Motor: 4+/5 grossly throughout   Assessment/Plan: 1. Functional deficits secondary to debility which require 3+ hours per day of interdisciplinary therapy in a comprehensive inpatient rehab setting.  Physiatrist is providing close team supervision and 24 hour management of active medical problems listed below.  Physiatrist and rehab team continue to assess barriers to discharge/monitor patient progress toward functional and medical goals  Care Tool:  Bathing  Bathing activity did not occur: Refused Body parts bathed by patient: Right arm, Left arm, Chest, Abdomen, Front perineal area, Buttocks, Right upper leg, Left upper leg, Face   Body parts bathed by helper:  Right lower leg, Left lower leg     Bathing assist Assist Level: Contact Guard/Touching assist     Upper Body Dressing/Undressing Upper body dressing Upper body dressing/undressing activity did not occur (including orthotics): Refused What is the patient wearing?: Pull over shirt    Upper body assist Assist Level: Contact Guard/Touching assist    Lower Body Dressing/Undressing Lower body dressing    Lower body dressing activity did not occur: Refused What is the patient wearing?: Pants, Incontinence brief     Lower body assist Assist for lower body dressing: Minimal Assistance - Patient > 75%     Toileting Toileting    Toileting assist Assist for toileting: Minimal Assistance - Patient > 75%     Transfers Chair/bed transfer  Transfers assist     Chair/bed transfer assist level: Minimal Assistance - Patient > 75%     Locomotion Ambulation   Ambulation assist      Assist level: Contact Guard/Touching assist Assistive device: Walker-rolling Max distance: 30   Walk 10 feet activity   Assist     Assist level: Contact Guard/Touching assist Assistive device: Walker-rolling   Walk 50 feet activity   Assist Walk 50 feet with 2 turns activity did not occur: Safety/medical concerns         Walk 150 feet activity   Assist Walk 150 feet activity did not occur: Safety/medical concerns (decreased balance/postural control, generalized weakness, poor endurance)         Walk 10 feet on uneven  surface  activity   Assist Walk 10 feet on uneven surfaces activity did not occur: Safety/medical concerns (decreased balance/postural control, generalized weakness, poor endurance)         Wheelchair     Assist Will patient use wheelchair at discharge?: Yes Type of Wheelchair: Manual    Wheelchair assist level: Supervision/Verbal cueing Max wheelchair distance: 74ft    Wheelchair 50 feet with 2 turns activity    Assist        Assist  Level: Moderate Assistance - Patient 50 - 74%   Wheelchair 150 feet activity     Assist      Assist Level: Total Assistance - Patient < 25%   Blood pressure 136/86, pulse 61, temperature 97.9 F (36.6 C), resp. rate 18, height 4\' 11"  (1.499 m), weight 61.4 kg, SpO2 96 %.  Medical Problem List and Plan: 1.  Debility secondary to B/L pneumonia - prolonged hospitalization with hx of laryngectomy and chronic trach  Continue CIR  Continue to follow with palliative care discharge  2.  H/o DVT/Antithrombotics: -anticoagulation:  Pharmaceutical: Other (comment)--on Eliquis             -antiplatelet therapy: will determine if needs 3. Pain Management: Denies pain 4. Mood: LCSW to follow for evaluation and support.              -antipsychotic agents: N/A 5. Neuropsych: This patient is capable of making decisions on her own behalf. 6. Skin/Wound Care: Routine pressure relief measures. Nystain cream underneath breasts for moisture 7. Fluids/Electrolytes/Nutrition: Monitor I/Os.  8. RLL HCAP: Cefepime completed 08/31. Saturating well.  9. COPD/mucus plugging:   Nebs weaned down to bid   Daily RT care ordered.   Increased Robitussin to 66mL q6H.   Chest PT ordered. 10. Pre-renal azotemia: Resolved 11. Leucocytosis: Resolved 12. Mild anemia:   Hemoglobin 9.9 on 9/6  Continue to monitor 13. HTN: Monitor BP . Continue metoprolol bid.   Controlled 9/7 14.  Sleep disturbance: melatonin prn ordered  Improving   LOS: 7 days A FACE TO FACE EVALUATION WAS PERFORMED  Katelyn [name] Katelyn [name] 11/01/2019, 8:57 AM

## 2019-11-02 ENCOUNTER — Inpatient Hospital Stay (HOSPITAL_COMMUNITY): Payer: Medicare Other

## 2019-11-02 ENCOUNTER — Inpatient Hospital Stay (HOSPITAL_COMMUNITY): Payer: Medicare Other | Admitting: Occupational Therapy

## 2019-11-02 DIAGNOSIS — R159 Full incontinence of feces: Secondary | ICD-10-CM

## 2019-11-02 NOTE — Progress Notes (Signed)
Pt is up in wheel chair. CPT Vest not done at this time.

## 2019-11-02 NOTE — Progress Notes (Signed)
Recreational Therapy Session Note  Patient Details  Name: Jaine Estabrooks MRN: [id number] Date of Birth: 05/24/1950 Today's Date: 11/02/2019  Pain: no c/o Skilled Therapeutic Interventions/Progress Updates: Session focused on activity tolerance and dynamic standing balance.  Pt stood to play 2 rounds of corn hole x2 with RW with close supervision and 2 rounds without RW with contact guard assist.    Therapy/Group: Co-Treatment  Cassandr Cederberg 11/02/2019, 11:53 AM

## 2019-11-02 NOTE — Progress Notes (Signed)
Held metoprolol due to pulse fluctuating hanging around  57. No s/s of distress and no complaints noted at this time.  Audie Clear, LPN

## 2019-11-02 NOTE — Progress Notes (Addendum)
Patient ID: Katelyn [name], female   DOB: 05/10/1950, 69 y.o.   MRN: [id number]  Spoke with [name]-granddaughter and pt to inform team conference progress toward her goals and discharge still 9/11. Pt really wants to go home feels can do better there. Granddaughter to call regarding wheelchair-[facility] [phone] regarding her wheelchair and the fact they turned it in and it was her's. She will have assistance at home via boyfriend and daughter's. Will resume [facility] and work toward discharge Sat. They do have humdified air for trach at home, according to [facility].

## 2019-11-02 NOTE — Progress Notes (Signed)
[place] PHYSICAL MEDICINE & REHABILITATION PROGRESS NOTE   Subjective/Complaints: Patient seen sitting up in bed this morning working with therapies.  Good sitting balance noted.  She states she slept fairly overnight, due to being in the hospital.  ROS: Denies CP, SOB, N/V/D  Objective:   No results found. Recent Labs    10/31/19 0523  WBC 4.7  HGB 9.9*  HCT 33.7*  PLT 156   Recent Labs    10/31/19 0523  NA 141  K 4.4  CL 107  CO2 24  GLUCOSE 81  BUN 13  CREATININE 0.89  CALCIUM 8.9    Intake/Output Summary (Last 24 hours) at 11/02/2019 0917 Last data filed at 11/01/2019 1230 Gross per 24 hour  Intake 60 ml  Output --  Net 60 ml     Physical Exam: Vital Signs Blood pressure 107/90, pulse 65, temperature 97.7 F (36.5 C), temperature source Oral, resp. rate 18, height 4\' 11"  (1.499 m), weight 61.4 kg, SpO2 97 %. Constitutional: No distress . Vital signs reviewed. HENT: Normocephalic.  Atraumatic. Neck: + Stoma.  + Trach collar. Eyes: EOMI. No discharge. Cardiovascular: No JVD.  RRR. Respiratory: Normal effort.  No stridor.  Bilateral clear to auscultation. GI: Non-distended.  BS +. Skin: Warm and dry.  Stoma.  Psych: Normal mood.  Normal behavior. Musc: No edema in extremities.  No tenderness in extremities. Neuro: Alert Motor: 4+/5 grossly throughout, unchanged  Assessment/Plan: 1. Functional deficits secondary to debility which require 3+ hours per day of interdisciplinary therapy in a comprehensive inpatient rehab setting.  Physiatrist is providing close team supervision and 24 hour management of active medical problems listed below.  Physiatrist and rehab team continue to assess barriers to discharge/monitor patient progress toward functional and medical goals  Care Tool:  Bathing  Bathing activity did not occur: Refused Body parts bathed by patient: Right arm, Left arm, Chest, Abdomen, Front perineal area, Buttocks, Right upper leg, Left upper  leg, Face   Body parts bathed by helper: Right lower leg, Left lower leg     Bathing assist Assist Level: Contact Guard/Touching assist     Upper Body Dressing/Undressing Upper body dressing Upper body dressing/undressing activity did not occur (including orthotics): Refused What is the patient wearing?: Pull over shirt    Upper body assist Assist Level: Set up assist    Lower Body Dressing/Undressing Lower body dressing    Lower body dressing activity did not occur: Refused What is the patient wearing?: Pants, Incontinence brief     Lower body assist Assist for lower body dressing: Minimal Assistance - Patient > 75%     Toileting Toileting    Toileting assist Assist for toileting: Minimal Assistance - Patient > 75%     Transfers Chair/bed transfer  Transfers assist     Chair/bed transfer assist level: Contact Guard/Touching assist     Locomotion Ambulation   Ambulation assist      Assist level: Contact Guard/Touching assist Assistive device: Walker-rolling Max distance: 55ft   Walk 10 feet activity   Assist     Assist level: Contact Guard/Touching assist Assistive device: Walker-rolling   Walk 50 feet activity   Assist Walk 50 feet with 2 turns activity did not occur: Safety/medical concerns         Walk 150 feet activity   Assist Walk 150 feet activity did not occur: Safety/medical concerns (decreased balance/postural control, generalized weakness, poor endurance)         Walk 10 feet on uneven  surface  activity   Assist Walk 10 feet on uneven surfaces activity did not occur: Safety/medical concerns (decreased balance/postural control, generalized weakness, poor endurance)         Wheelchair     Assist Will patient use wheelchair at discharge?: Yes Type of Wheelchair: Manual    Wheelchair assist level: Supervision/Verbal cueing Max wheelchair distance: 34ft    Wheelchair 50 feet with 2 turns  activity    Assist        Assist Level: Supervision/Verbal cueing   Wheelchair 150 feet activity     Assist      Assist Level: Total Assistance - Patient < 25%   Blood pressure 107/90, pulse 65, temperature 97.7 F (36.5 C), temperature source Oral, resp. rate 18, height 4\' 11"  (2.023 m), weight 61.4 kg, SpO2 97 %.  Medical Problem List and Plan: 1.  Debility secondary to B/L pneumonia - prolonged hospitalization with hx of laryngectomy and chronic trach  Continue CIR  Continue to follow with palliative care discharge   Team conference today to discuss current and goals and coordination of care, home and environmental barriers, and discharge planning with nursing, case manager, and therapies. Please see conference note from today as well.  2.  H/o DVT/Antithrombotics: -anticoagulation:  Pharmaceutical: Other (comment)--on Eliquis             -antiplatelet therapy: will determine if needs 3. Pain Management: Denies pain 4. Mood: LCSW to follow for evaluation and support.              -antipsychotic agents: N/A 5. Neuropsych: This patient is capable of making decisions on her own behalf. 6. Skin/Wound Care: Routine pressure relief measures. Nystain cream underneath breasts for moisture 7. Fluids/Electrolytes/Nutrition: Monitor I/Os.  8. RLL HCAP: Cefepime completed 08/31. Saturating well.  9. COPD/mucus plugging:   Nebs weaned down to bid   Daily RT care ordered.   Increased Robitussin to 5mL q6H.   Chest PT ordered.  No repeat episodes of mucous plugging  Supplemental oxygen dependent 10. Pre-renal azotemia: Resolved 11. Leucocytosis: Resolved 12. Mild anemia:   Hemoglobin 9.9 on 9/6  Continue to monitor 13. HTN: Monitor BP . Continue metoprolol bid.   Controlled 9/8 14.  Sleep disturbance: melatonin prn ordered  Improving  15.  Bowel incontinence  Will consider medications if persistent.  LOS: 8 days A FACE TO FACE EVALUATION WAS PERFORMED  Dionisia Pacholski Lorie Phenix 11/02/2019, 9:17 AM

## 2019-11-02 NOTE — Patient Care Conference (Signed)
Inpatient RehabilitationTeam Conference and Plan of Care Update Date: 11/02/2019   Time: 11:48 AM    Patient Name: Katelyn [name]      Medical Record Number: [id number]  Date of Birth: Jul 17, 1950 Sex: Female         Room/Bed: 4W01C/4W01C-01 Payor Info: Payor: MEDICARE / Plan: MEDICARE PART A AND B / Product Type: *No Product type* /    Admit Date/Time:  10/25/2019  3:28 PM  Primary Diagnosis:  [place] [facility] Problems: Principal Problem:   Debility Active Problems:   Acute respiratory failure with hypoxia ([place])   Sleep disturbance   Chronic obstructive pulmonary disease ([place])   Incontinence of feces    Expected Discharge Date: Expected Discharge Date: 11/05/19  Team Members Present: Physician leading conference: Dr. Delice Lesch Care Coodinator Present: Dorien Chihuahua, RN, BSN, CRRN;Other (comment) Jacqlyn Larsen [place], SW) Nurse Present: Judee Clara, LPN PT Present: Becky Sax, PT OT Present: Simonne Come, OT SLP Present: Charolett Bumpers, SLP PPS Coordinator present : Ileana Ladd, Burna Mortimer, SLP     Current Status/Progress Goal Weekly Team Focus  Bowel/Bladder   Pt is mostly incontinent of bowel and bladder. LBM-10/30/19.  To become more continent of bowel/bladder.  Assess tolieting needs q2, answer call light promptly.   Swallow/Nutrition/ Hydration             ADL's   CGA sit > stand, CGA dynamic standing for LB bathing, Min-Mod A LB dressing  Supervision overall, downgraded toilet transfer to CGA and LB dressing to MinA  ADL retraining, dynamic standing balance, transfers, activity tolerance/endurance   Mobility   bed mobility min A, transfers min A, gait 40ft min A, WC mobility 7ft supervision  supervision  functional mobility/transfers, generalized strengthening, dynamic standing balance/coordination, ambulation, endurance   Communication             Safety/Cognition/ Behavioral Observations            Pain   No complaints of pain.  To remain pain  free.  Assess pain q shift or prn.   Skin   Other than trach stoma, skin is intact.  To prevent any breakdown from occurring and infections.  Assess skin q shift or prn.     Discharge Planning:  Pt progressing but having good days and bad days according to her breathing issues. Going home with boyfriend and daughter and granddaughter to assist   Team Discussion: BP controlled with current regimen. Currently at [place] overall. Good recovery of oxygen sats with activity on with RA. Has set up for humidified air for night time at home from previous admission. Patient unable to obtain another wheelchair for discharge as she returned previous DME she had. Patient on target to meet rehab goals: no, patient progress limited by fatigue, decreased motivation, decreased participation in therapy.  *See Care Plan and progress notes for long and short-term goals.   Revisions to Treatment Plan:  Goals downgraded Teaching Needs: Practice bathing and dressing with Adaptive equipment  Current Barriers to Discharge: Decreased caregiver support  Possible Resolutions to Barriers: Family education with daughter     Medical Summary Current Status: Debility secondary to B/L pneumonia - prolonged hospitalization with hx of laryngectomy and chronic trach  Barriers to Discharge: Medical stability;Neurogenic Bowel & Bladder   Possible Resolutions to [employer] Focus: Therapies,follow labs, continue supplemental oxygen   Continued Need for Acute Rehabilitation Level of Care: The patient requires daily medical management by a physician with specialized training in physical medicine and rehabilitation  for the following reasons: Direction of a multidisciplinary physical rehabilitation program to maximize functional independence : Yes Medical management of patient stability for increased activity during participation in an intensive rehabilitation regime.: Yes Analysis of laboratory values and/or  radiology reports with any subsequent need for medication adjustment and/or medical intervention. : Yes   I attest that I was present, lead the team conference, and concur with the assessment and plan of the team.   Dorien Chihuahua B 11/02/2019, 2:58 PM

## 2019-11-02 NOTE — Progress Notes (Signed)
Occupational Therapy Session Note  Patient Details  Name: [name] MRN: [id number] Date of Birth: Jan 06, 1951  Today's Date: 11/02/2019 OT Individual Time: 6195-0932 OT Individual Time Calculation (min): 57 min    Short Term Goals: Week 1:  OT Short Term Goal 1 (Week 1): Pt will complete bathing with min assist with <2 rest breaks to demonstrate increased activity tolerance OT Short Term Goal 2 (Week 1): Pt will complete LB dressing with min assist with <1 rest break to demonstrate increased activity tolerance OT Short Term Goal 3 (Week 1): Pt will complete toileting with min assist  Skilled Therapeutic Interventions/Progress Updates:    Treatment session with focus on ADL retraining and activity tolerance.  Pt received supine in bed with O2 via trach collar.  Pt agreeable to bathing at EOB.  Therapist setup pt with all items, pt able to complete bathing after setup with exception of requiring CGA when washing buttocks in standing.  Pt reports need to toilet.  Completed stand pivot transfer bed > BSC with CGA and cues for hand placement.  Therapist assisted with hygiene post BM due to loose BM.  Engaged in dressing at [place] with setup for UB dressing.  Therapist educated pt on use of reacher for LB dressing with pt still requiring min assist to thread RLE and then CGA when standing to pull pullup and pants over hips.  Pt completed stand pivot transfer bed > w/c with CGA and remained upright in w/c with seat belt alarm on and all needs in reach.   Pt's O2 sats dropped to mid 80s with dynamic standing activity during self-care, but returned to mid to high 90s after rest.    Therapy Documentation Precautions:  Precautions Precautions: Fall Precaution Comments: Trach Restrictions Weight Bearing Restrictions: No General:   Vital Signs: Therapy Vitals Temp: 97.7 F (36.5 C) Temp Source: Oral Pulse Rate: 65 Resp: 18 BP: 107/90 Patient Position (if appropriate): Sitting Oxygen  Therapy SpO2: 97 % O2 Device: Other (Comment) (Stoma) O2 Flow Rate (L/min): 5 L/min FiO2 (%): 28 % Pain:  Pt with no c/o pain   Therapy/Group: Individual Therapy  Simonne Come 11/02/2019, 8:52 AM

## 2019-11-02 NOTE — Progress Notes (Signed)
Physical Therapy Session Note  Patient Details  Name: Katelyn [name] MRN: [id number] Date of Birth: Dec 08, 1950  Today's Date: 11/02/2019 PT Individual Time: 7829-5621 and 3086-5784 PT Individual Time Calculation (min): 72 min and 42 min PT Missed Time: 33 minutes due to fatigue  Short Term Goals: Week 1:  PT Short Term Goal 1 (Week 1): pt will perform bed mobility with CGA overall PT Short Term Goal 1 - Progress (Week 1): Met PT Short Term Goal 2 (Week 1): Pt will transfer bed<>WC with LRAD CGA PT Short Term Goal 2 - Progress (Week 1): Met PT Short Term Goal 3 (Week 1): Pt will ambulate 24f with LRAD CGA PT Short Term Goal 3 - Progress (Week 1): Progressing toward goal Week 2:  PT Short Term Goal 1 (Week 2): STG=LTG due to LOS  Skilled Therapeutic Interventions/Progress Updates:   Treatment Session 1: [id number] min Received pt sitting in WC, pt agreeable to therapy, and denied any pain during session but reported having diarrhea this morning (PA Pam aware). Session with emphasis on functional mobility/transfers, generalized strengthening, dynamic standing balance/coordination, ambulation, and improved activity tolerance. Recreational therapy present during part of session. Pt performed WC mobility 273fx 1 and 8559f 1 using bilateral UEs and supervision. Pt required cues for propulsion stride and encouragement to avoid pulling on hallway railing to compensate. PA Pam present and requested pt return to room to dislodge hardened secretions from trach. PA dislodged hardened secretions and therapist and PA educated pt/pt's significant other on importance of cleaning trach site daily. Pt/pt's husband verbalized understanding. Pt transported to dayroom and transferred WC<>mat stand<>pivot with RW and CGA. Pt worked on dynamic standing balance playing cornhole x 4 games with RW and close supervision and x 3 games without AD and CGA with emphasis on reaching outside BOS to grab beanbags. Pt  ambulated 58f4fth RW and CGA/close supervision. Pt demonstrated R LE ER, flexed trunk, and wide BOS. Pt required significantly less verbal cues for RW safety. Stand<>pivot WC<>Nustep with RW CGA and pt performed bilateral UE and L LE strengthening on Nustep at workload 3 for 4 minutes with 5 rest breaks for 122 steps for improved cardiovascular endurance. Pt initially started with bilateral LE strengthening but reported increased R hip pain. Pt requested to stop on Nustep due to increased stomach pain, but denied needing to use restroom. Pt [facility] and requested to return to bed. Stand<>pivot WC<>bed with RW and close supervision and sit<>supine with supervision. Pt required max A to scoot [facility] with pt supine in bed, needs within reach, and bed alarm on.    Treatment Session 2: [id number] Received pt supine in bed, pt agreeable to therapy, and denied any pain during session but requesting to wait for RN to bring her soup. Therapist encouraged pt to participate in therapy until soup arrived; pt agreed. Session with emphasis on functional mobility/transfers, generalized strengthening, dynamic standing balance/coordinaiton, ambulation, and improved activity tolerance. Pt performed bed mobility with supervision with HOB elevated and use of bedrails and transferred bed<>WC stand<>pivot with RW and CGA. Pt [facility] to ortho gym [facility] purposes and ambulated 10ft16funeven surfaces (ramp) with RW and min A ascending ramp and mod A descending ramp with manual facilitation to control RW; as pt with tendency to push RW too far forward. Pt [facility] day[facility] and transferred WC<>mat stand<>pivot with RW  and CGA. Pt performed the following exercises sitting on mat with supervision and verbal cues for technique: -LAQ with 2lb ankle weight x12 bilaterally -hip flexion with 2lb ankle weight x12 bilaterally Worked  on dynamic sitting balance batting ball with 1lb dowel 2x15 reps with emphasis on trunk control and core stability. Pt declined any further participation due to fatigue and desire to eat her soup and requested to return to room. Stand<>pivot mat<>WC with RW and CGA and pt transported back to room in [facility] total A. Pt agreed to sit up in [facility] to eat. Concluded session with pt sitting in WC, needs within reach, and seatbelt alarm on. 33 minutes missed of skilled physical therapy due to pt fatigue.   Therapy Documentation Precautions:  Precautions Precautions: Fall Precaution Comments: Trach Restrictions Weight Bearing Restrictions: No  Therapy/Group: Individual Therapy Alfonse Alpers PT, DPT   11/02/2019, 7:20 AM

## 2019-11-03 ENCOUNTER — Inpatient Hospital Stay (HOSPITAL_COMMUNITY): Payer: Medicare Other | Admitting: Occupational Therapy

## 2019-11-03 ENCOUNTER — Inpatient Hospital Stay (HOSPITAL_COMMUNITY): Payer: Medicare Other

## 2019-11-03 DIAGNOSIS — R0989 Other specified symptoms and signs involving the circulatory and respiratory systems: Secondary | ICD-10-CM

## 2019-11-03 DIAGNOSIS — I10 Essential (primary) hypertension: Secondary | ICD-10-CM

## 2019-11-03 LAB — CBC
HCT: 38.9 % (ref 36.0–46.0)
Hemoglobin: 11.6 g/dL — ABNORMAL LOW (ref 12.0–15.0)
MCH: 29.1 pg (ref 26.0–34.0)
MCHC: 29.8 g/dL — ABNORMAL LOW (ref 30.0–36.0)
MCV: 97.5 fL (ref 80.0–100.0)
Platelets: 153 10*3/uL (ref 150–400)
RBC: 3.99 MIL/uL (ref 3.87–5.11)
RDW: 13.9 % (ref 11.5–15.5)
WBC: 5.5 10*3/uL (ref 4.0–10.5)
nRBC: 0 % (ref 0.0–0.2)

## 2019-11-03 MED ORDER — DIPHENOXYLATE-ATROPINE 2.5-0.025 MG/5ML PO LIQD
5.0000 mL | Freq: Four times a day (QID) | ORAL | Status: DC | PRN
  Administered 2019-11-04 – 2019-11-05 (×2): 5 mL via ORAL
  Filled 2019-11-03 (×2): qty 5

## 2019-11-03 MED ORDER — CALCIUM POLYCARBOPHIL 625 MG PO TABS
625.0000 mg | ORAL_TABLET | Freq: Every day | ORAL | Status: DC
  Administered 2019-11-03 – 2019-11-05 (×3): 625 mg via ORAL
  Filled 2019-11-03 (×3): qty 1

## 2019-11-03 NOTE — Progress Notes (Signed)
Manufacturing engineer [facility]) Community Based Palliative Care  This patienthas been referred to ourpalliative care services in the community.ACC will continue to follow for any discharge planning needs and to coordinate continuation of palliative care. If you have questions or need assistance, please call [phone] or contact the hospital Liaison listed on AMION.  Thank you for the opportunity to participate in this patient's care.  Farrel Gordon, RN, [facility] Liaison   [phone]

## 2019-11-03 NOTE — Progress Notes (Signed)
Occupational Therapy Session Note  Patient Details  Name: Katelyn [name] MRN: [id number] Date of Birth: [birth year]  Today's Date: 11/03/2019 OT Individual Time: 1601-0932   &  3557-3220 OT Individual Time Calculation (min): 64 min & 11 min (missed 19 minutes due to fatigue and nursing care needs)   Short Term Goals: Week 1:  OT Short Term Goal 1 (Week 1): Pt will complete bathing with min assist with <2 rest breaks to demonstrate increased activity tolerance OT Short Term Goal 2 (Week 1): Pt will complete LB dressing with min assist with <1 rest break to demonstrate increased activity tolerance OT Short Term Goal 3 (Week 1): Pt will complete toileting with min assist  Skilled Therapeutic Interventions/Progress Updates:    AM session:   Patient in bed, alert and finishing respiratory treatment.  On O2 with humidity via trach collar.  Saturation > 95% t/o session.   She denies pain and agrees to get OOB for therapy .  She notes that she is not sleeping well, but feels okay.  Supine to sitting edge of bed with CS.  Sit to stand and SPT with RW to/from bed and w/c CGA.  She completed bathing w.c level with set up.  Donned pants min A - CM in stance CGA.  OH shirt set up.  She finished breakfast with set up.  Completed ambulation with RW 30 feet x 2 with CGA.  She remained seated in w/c at close of session, seat belt alarm set and call bell in reach.     PM session:   Patient in bed, alert, talking with daughter on ipad.  She denies pain but states that she is too tired for therapy this afternoon.   Attempted to assist with hygiene at trach site due to build up of mucous but unable to clear - nursing present and will address at this time.  She missed the remainder of session     Therapy Documentation Precautions:  Precautions Precautions: Fall Precaution Comments: Trach Restrictions Weight Bearing Restrictions: No   Therapy/Group: Individual Therapy  Carlos Levering 11/03/2019, 7:30  AM

## 2019-11-03 NOTE — Progress Notes (Signed)
[facility] PHYSICAL MEDICINE & REHABILITATION PROGRESS NOTE   Subjective/Complaints: Patient seen sitting up in her chair this AM, working with therapies.  She states she slept fairly overnight.  She has question regarding cost of d/c meds.  ROS: Denies CP, SOB, N/V/D  Objective:   No results found. No results for input(s): WBC, HGB, HCT, PLT in the last 72 hours. No results for input(s): NA, K, CL, CO2, GLUCOSE, BUN, CREATININE, CALCIUM in the last 72 hours.  Intake/Output Summary (Last 24 hours) at 11/03/2019 1126 Last data filed at 11/03/2019 0842 Gross per 24 hour  Intake 25 ml  Output --  Net 25 ml     Physical Exam: Vital Signs Blood pressure 126/79, pulse 70, temperature 97.9 F (36.6 C), temperature source Oral, resp. rate 16, height 4\' 11"  (1.499 m), weight 61.4 kg, SpO2 96 %.  Constitutional: No distress . Vital signs reviewed. HENT: Normocephalic.  Atraumatic. Neck: +Stoma, trach collar. Eyes: EOMI. No discharge. Cardiovascular: No JVD.  RRR. Respiratory: Normal effort.  No stridor.  Bilateral clear to auscultation. GI: Non-distended.  BS +. Skin: Warm and dry.  Intact. Psych: Normal mood.  Normal behavior. Musc: No edema in extremities.  No tenderness in extremities. Neuro: Alert Motor: 4+/5 grossly throughout, stable  Assessment/Plan: 1. Functional deficits secondary to debility which require 3+ hours per day of interdisciplinary therapy in a comprehensive inpatient rehab setting.  Physiatrist is providing close team supervision and 24 hour management of active medical problems listed below.  Physiatrist and rehab team continue to assess barriers to discharge/monitor patient progress toward functional and medical goals  Care Tool:  Bathing  Bathing activity did not occur: Refused Body parts bathed by patient: Right arm, Left arm, Chest, Abdomen, Front perineal area, Buttocks, Right upper leg, Left upper leg, Face   Body parts bathed by helper: Right lower  leg, Left lower leg     Bathing assist Assist Level: Contact Guard/Touching assist     Upper Body Dressing/Undressing Upper body dressing Upper body dressing/undressing activity did not occur (including orthotics): Refused What is the patient wearing?: Pull over shirt    Upper body assist Assist Level: Set up assist    Lower Body Dressing/Undressing Lower body dressing    Lower body dressing activity did not occur: Refused What is the patient wearing?: Pants, Incontinence brief     Lower body assist Assist for lower body dressing: Minimal Assistance - Patient > 75%     Toileting Toileting    Toileting assist Assist for toileting: Minimal Assistance - Patient > 75%     Transfers Chair/bed transfer  Transfers assist     Chair/bed transfer assist level: Contact Guard/Touching assist     Locomotion Ambulation   Ambulation assist      Assist level: Contact Guard/Touching assist Assistive device: Walker-rolling Max distance: 55ft   Walk 10 feet activity   Assist     Assist level: Contact Guard/Touching assist Assistive device: Walker-rolling   Walk 50 feet activity   Assist Walk 50 feet with 2 turns activity did not occur: Safety/medical concerns  Assist level: Contact Guard/Touching assist Assistive device: Walker-rolling    Walk 150 feet activity   Assist Walk 150 feet activity did not occur: Safety/medical concerns (decreased balance/postural control, generalized weakness, poor endurance)         Walk 10 feet on uneven surface  activity   Assist Walk 10 feet on uneven surfaces activity did not occur: Safety/medical concerns (decreased balance/postural control, generalized weakness, poor  endurance)   Assist level: Moderate Assistance - Patient - 50 - 74% Assistive device: Aeronautical engineer Will patient use wheelchair at discharge?: Yes Type of Wheelchair: Manual    Wheelchair assist level:  Supervision/Verbal cueing Max wheelchair distance: 79ft    Wheelchair 50 feet with 2 turns activity    Assist        Assist Level: Supervision/Verbal cueing   Wheelchair 150 feet activity     Assist      Assist Level: Total Assistance - Patient < 25%   Blood pressure 126/79, pulse 70, temperature 97.9 F (36.6 C), temperature source Oral, resp. rate 16, height 4\' 11"  (1.499 m), weight 61.4 kg, SpO2 96 %.  Medical Problem List and Plan: 1.  Debility secondary to B/L pneumonia - prolonged hospitalization with hx of laryngectomy and chronic trach  Continue CIR  Continue to follow with palliative care discharge  2.  H/o DVT/Antithrombotics: -anticoagulation:  Pharmaceutical: Other (comment)--on Eliquis             -antiplatelet therapy: will determine if needs 3. Pain Management: Denies pain 4. Mood: LCSW to follow for evaluation and support.              -antipsychotic agents: N/A 5. Neuropsych: This patient is capable of making decisions on her own behalf. 6. Skin/Wound Care: Routine pressure relief measures. Nystain cream underneath breasts for moisture 7. Fluids/Electrolytes/Nutrition: Monitor I/Os.  8. RLL HCAP: Cefepime completed 08/31. Saturating well.  9. COPD/mucus plugging:   Nebs weaned down to bid   Daily RT care ordered.   Increased Robitussin to 67mL q6H.   Chest PT ordered.  No repeat episodes of mucous plugging  Humidified oxygen dependent 10. Pre-renal azotemia: Resolved 11. Leucocytosis: Resolved 12. Mild anemia:   Hemoglobin 9.9 on 9/6, labs ordered  Continue to monitor 13. HTN: Monitor BP . Continue metoprolol bid.   Labile on 9/9, monitor for trned 14.  Sleep disturbance: melatonin prn ordered  Improving  15.  Bowel incontinence  Fiber added on 9/9  LOS: 9 days A FACE TO FACE EVALUATION WAS PERFORMED  Katelyn [name] Katelyn [name] 11/03/2019, 11:26 AM

## 2019-11-03 NOTE — Plan of Care (Signed)
  Problem: Consults Goal: RH GENERAL PATIENT EDUCATION Description: See Patient Education module for education specifics. Outcome: Progressing   Problem: RH BOWEL ELIMINATION Goal: RH STG MANAGE BOWEL WITH ASSISTANCE Description: STG Manage Bowel with Assistance. mod Outcome: Progressing   Problem: RH BLADDER ELIMINATION Goal: RH STG MANAGE BLADDER WITH ASSISTANCE Description: STG Manage Bladder With Assistance mod Outcome: Progressing   Problem: RH SKIN INTEGRITY Goal: RH STG SKIN FREE OF INFECTION/BREAKDOWN Description: Skin remain free of breakdown and infection with mod assist Outcome: Progressing

## 2019-11-03 NOTE — Progress Notes (Signed)
Patient had small mucus plug form this afternoon, but did not block airway entirely, no distress. Coughed some up, then rest removed by RN.

## 2019-11-03 NOTE — Progress Notes (Signed)
Patient with multiple incontinent episodes due to loose stools--had miralax day before yesterday. Will add lomotil prn. No fever or Leucocytosis on CBC this am. Will monitor.

## 2019-11-03 NOTE — Progress Notes (Signed)
Physical Therapy Session Note  Patient Details  Name: Katelyn [name] MRN: [id number] Date of Birth: 1950-05-19  Today's Date: 11/03/2019 PT Individual Time: 1000-1058 and 1300-1345  PT Individual Time Calculation (min): 58 min and 52  Short Term Goals: Week 1:  PT Short Term Goal 1 (Week 1): pt will perform bed mobility with CGA overall PT Short Term Goal 1 - Progress (Week 1): Met PT Short Term Goal 2 (Week 1): Pt will transfer bed<>WC with LRAD CGA PT Short Term Goal 2 - Progress (Week 1): Met PT Short Term Goal 3 (Week 1): Pt will ambulate 61f with LRAD CGA PT Short Term Goal 3 - Progress (Week 1): Progressing toward goal Week 2:  PT Short Term Goal 1 (Week 2): STG=LTG due to LOS  Skilled Therapeutic Interventions/Progress Updates:   Treatment Session 1: 1000-1058 58 min Received pt sitting in WC, pt agreeable to therapy, and denied any pain at start of session but reported increased R hip pain with weight bearing activities. Pt continues to require encouragement to participate in therapy sessions and shows decreased motivation. Session with emphasis on functional mobility/transfers, generalized strengthening, dynamic standing balance/coordination, ambulation, simulated car transfers, stair navigation, and improved activity tolerance. [facility] and performed simulated car transfer with RW and CGA with cues for RW safety. Pt transported to therapy gym and navigated 3 steps with 2 rails min A ascending forwards and descending backwards with a step to pattern. Pt required cues for "up with the good and down with the bad" technique but demonstrated poor carry over. Pt ambulated 521fwith RW and CGA. Pt continues to demonstrated R hip ER, flexed trunk, decreased stride length, and wide BOS despite verbal cues. Pt transferred sit<>stand without AD x 7 reps and x1 with RW and engaged in dynamic standing balance bowling with CGA. Pt bowled x2 rounds sitting in WC due  to increased fatigue. Pt transferred sit<>stand without AD and CGA and worked on dynamic standing balance dribbling ball x 3 trials with CGA. [facility] and requested to return to bed. Pt ambulated 1f4fith RW CGA back to bed and transferred sit<>supine with supervision and required max A to sc[facility] to [facility]cluded session with pt supine in bed, needs within reach, and bed alarm on.  Treatment Session 2: [id number] min Received pt supine in bed shaking head "no" upon therapist's entry. Pt reported her stomach hurt and she has been having diarrhea all day. Pt denied any pain during session. Session with emphasis on functional mobility/transfers, generalized strengthening, dynamic standing balance/coordination, ambulation, toileting, and improved activity tolerance. Therapist noticed thickened secretions clogging trach and encouraged pt to perform secretion clearance independently and provided mirror for pt. Pt worked on clearing secretions with warm washcloth with encouragement to cough. Pt with difficultly clearing secretions and ultimately required total A from therapist. Educated pt on importance of clearing secretions from trach daily upon D/C or allowing her significant other to assist her; pt verbalized understanding. Therapist encouraged pt to try sitting on commode to ease stomach pain and with mod convincing, pt finally agreed.  Pt transferred supine<>sitting EOB with HOB elevated, increased time, and supervision. Pt transferred sit<>stand with RW CGA and ambulated 76f31fth RW CGA (min A to navigate threshold in bathroom). Pt required max cues for RW safety when ambulating in/out of bathroom as pt with tendency to let go of RW and grab onto door frame. Pt  required max A for clothing management and noted pt with loose stool soiling brief and pants. Pt with continued loose stool and required total A for peri-care in standing. Pt washed hands sitting in WC at sink with  supervision. Concluded session with pt sitting in WC, needs within reach, and seatbelt alarm on. RN and PA aware of pt's current status and adjusting medications as needed.   Therapy Documentation Precautions:  Precautions Precautions: Fall Precaution Comments: Trach Restrictions Weight Bearing Restrictions: No   Therapy/Group: Individual Therapy Katelyn [name] PT, DPT   11/03/2019, 7:30 AM

## 2019-11-04 ENCOUNTER — Inpatient Hospital Stay (HOSPITAL_COMMUNITY): Payer: Medicare Other | Admitting: Occupational Therapy

## 2019-11-04 ENCOUNTER — Encounter (HOSPITAL_COMMUNITY): Payer: Medicare Other

## 2019-11-04 ENCOUNTER — Inpatient Hospital Stay (HOSPITAL_COMMUNITY): Payer: Medicare Other

## 2019-11-04 MED ORDER — METOPROLOL TARTRATE 25 MG PO TABS: 25.0000 mg | ORAL_TABLET | Freq: Two times a day (BID) | ORAL | 0 refills | Status: AC

## 2019-11-04 MED ORDER — ATORVASTATIN CALCIUM 80 MG PO TABS: 80.0000 mg | ORAL_TABLET | Freq: Every day | ORAL | Status: DC

## 2019-11-04 MED ORDER — NYSTATIN 100000 UNIT/GM EX CREA: 1.0000 "application " | TOPICAL_CREAM | Freq: Two times a day (BID) | CUTANEOUS | 0 refills | Status: AC

## 2019-11-04 MED ORDER — APIXABAN 5 MG PO TABS: 5.0000 mg | ORAL_TABLET | Freq: Two times a day (BID) | ORAL | 0 refills | Status: AC

## 2019-11-04 MED ORDER — CALCIUM POLYCARBOPHIL 625 MG PO TABS: 625.0000 mg | ORAL_TABLET | Freq: Every day | ORAL | 0 refills | Status: DC

## 2019-11-04 MED ORDER — MELATONIN 3 MG PO TABS: 3.0000 mg | ORAL_TABLET | Freq: Every evening | ORAL | 0 refills | Status: DC | PRN

## 2019-11-04 NOTE — Discharge Instructions (Signed)
Inpatient Rehab Discharge Instructions  Katelyn [name] Discharge date and time:  11/04/19  Activities/Precautions/ Functional Status: Activity: no lifting, driving, or strenuous exercise till cleared by MD Diet: regular diet Wound Care: none needed    Functional status:  ___ No restrictions     ___ Walk up steps independently _X__ 24/7 supervision/assistance   ___ Walk up steps with assistance ___ Intermittent supervision/assistance  ___ Bathe/dress independently ___ Walk with walker     ___ Bathe/dress with assistance ___ Walk Independently    ___ Shower independently ___ Walk with assistance    _X__ Shower with assistance _X__ No alcohol     ___ Return to work/school ________  Special Instructions:    COMMUNITY REFERRALS UPON DISCHARGE:    Home Health:   PT, OT, RN                [place] TO SET UP FOLLOW UP  Medical Equipment/Items Ordered:HAS EQUIPMENT FROM PAST ADMISSIONS-IF WANTS WHEELCHAIR AWARE WILL NEED TO RENT.                                                     My questions have been answered and I understand these instructions. I will adhere to these goals and the provided educational materials after my discharge from the hospital.  Patient/Caregiver Signature _______________________________ Date __________  Clinician Signature _______________________________________ Date __________  Please bring this form and your medication list with you to all your follow-up doctor's appointments.   Information on my medicine - ELIQUIS (apixaban)  This medication education was reviewed with me or my healthcare representative as part of my discharge preparation.  The pharmacist that spoke with me during my hospital stay was:  Onnie Boer, RPH-CPP  Why was Eliquis prescribed for you? Eliquis was prescribed to treat blood clots that may have been found in the veins of your  legs (deep vein thrombosis) or in your lungs (pulmonary embolism) and to reduce the risk of them occurring again.  What do You need to know about Eliquis ? Continue Eliquis 5mg  twice daily.  Eliquis may be taken with or without food.   Try to take the dose about the same time in the morning and in the evening. If you have difficulty swallowing the tablet whole please discuss with your pharmacist how to take the medication safely.  Take Eliquis exactly as prescribed and DO NOT stop taking Eliquis without talking to the doctor who prescribed the medication.  Stopping may increase your risk of developing a new blood clot.  Refill your prescription before you run out.  After discharge, you should have regular check-up appointments with your healthcare provider that is prescribing your Eliquis.    What do you do if you miss a dose? If a dose of ELIQUIS is not taken at the scheduled time, take it as soon as possible on the same day and twice-daily administration should be resumed. The dose should not be doubled to make up for a missed dose.  Important Safety Information A possible side effect of Eliquis is bleeding. You should call your healthcare provider right away if you experience any of the following: ? Bleeding from an injury or your nose that does not stop. ? Unusual colored urine (red or dark brown)  or unusual colored stools (red or black). ? Unusual bruising for unknown reasons. ? A serious fall or if you hit your head (even if there is no bleeding).  Some medicines may interact with Eliquis and might increase your risk of bleeding or clotting while on Eliquis. To help avoid this, consult your healthcare provider or pharmacist prior to using any new prescription or non-prescription medications, including herbals, vitamins, non-steroidal anti-inflammatory drugs (NSAIDs) and supplements.  This website has more information on Eliquis (apixaban):  http://www.eliquis.com/eliquis/home

## 2019-11-04 NOTE — Progress Notes (Signed)
Physical Therapy Session Note  Patient Details  Name: Brynn Mulgrew MRN: [id number] Date of Birth: 1950-12-31  Today's Date: 11/04/2019 PT Group Time: 1105-1159 PT Group Time Calculation (min): 54 min  Short Term Goals: Week 2:  PT Short Term Goal 1 (Week 2): STG=LTG due to LOS  Skilled Therapeutic Interventions/Progress Updates:     Pt participated in wheelchair management and mobility group to address independence needs with wheelchair parts management and mobilizing in a wheelchair in a social setting. Pt requires supervision assistance for wheelchair part management during session. Therapist educated patient on removing foot rests and adjusting to correct height, engaging and disengaging breaks, removal of arm rests, body mechanics for optimal self propulsion in straight line, performing gradual turns in both directions, performing sharp turns in both directions, and propelling WC backward.  Pt participated in wheelchair obstacle course activity to address the above learning needs. Pt propelled WC with BUEs between cones, around obstacles, and while holding onto objects, with PT providing cuing for safety and efficiency. Pt does not complain of pain during session.  Therapy Documentation Precautions:  Precautions Precautions: Fall, Other (comment) Precaution Comments: Trach Restrictions Weight Bearing Restrictions: No    Therapy/Group: Group Therapy  Breck Coons, PT, DPT 11/04/2019, 4:05 PM

## 2019-11-04 NOTE — Progress Notes (Signed)
Occupational Therapy Session Note  Patient Details  Name: Katelyn [name] MRN: [id number] Date of Birth: Feb 19, 1951  Today's Date: 11/04/2019 OT Individual [phone] and [id number] OT Individual Time Calculation (min): 10 min and 68 min    Short Term Goals: Week 1:  OT Short Term Goal 1 (Week 1): Pt will complete bathing with min assist with <2 rest breaks to demonstrate increased activity tolerance OT Short Term Goal 2 (Week 1): Pt will complete LB dressing with min assist with <1 rest break to demonstrate increased activity tolerance OT Short Term Goal 3 (Week 1): Pt will complete toileting with min assist  Skilled Therapeutic Interventions/Progress Updates:    1) Upon arrival, pt asleep requiring increased time to arouse.  Pt shaking head "no" and patting stomach when encouraged to engage in therapy session.  Pt reports continued loose stools and minimal sleep overnight.  Therapist explained purpose of therapy session and preparation for d/c tomorrow.  Pt reports "ready" to go home.  Pt continued to refuse therapy at this time.  Will attempt to reschedule for later in AM.  2) Treatment session with focus on functional transfers and self-care retraining.  Pt received semi-reclined in bed agreeable to therapy session.  Pt completed bed mobility with supervision and ambulated to sink with RW with Close supervision and cues for sequencing and spacing within RW.  Pt engaged in bathing at sit > stand level at sink with close supervision when standing.  Pt reports need to toilet, therefore ambulated in to bathroom with CGA with RW and min assist when navigating threshold in to bathroom due to unsafe reaching for doorway vs maintaining grasp on RW.  Pt incontinent of BM and then continued loose stool on toilet once seated. Pt required increased time on toilet due to increased amount of loose stool.  Pt required physical assistance with hygiene for thoroughness after incontinence.  Pt able to  complete LB dressing with min cues for sequencing with threading BLE in to pullup and problem solving with pants.  Min assist due to nature of fit of pants.  Pt agreeable to remaining upright in w/c.  Pt reports hungry, notified RN and nurse tech to locate snack prior to lunch.  Left with nurse tech present to assist with procuring a snack.  Therapy Documentation Precautions:  Precautions Precautions: Fall, Other (comment) Precaution Comments: Trach Restrictions Weight Bearing Restrictions: No General:   Vital Signs: Therapy Vitals Pulse Rate: (!) 56 BP: 138/84 Pain: Pt with c/o pain in stomach, toileting to attempt to relieve pain/pressure  Therapy/Group: Individual Therapy  Simonne Come 11/04/2019, 8:15 AM

## 2019-11-04 NOTE — Progress Notes (Signed)
[facility] PHYSICAL MEDICINE & REHABILITATION PROGRESS NOTE   Subjective/Complaints: Patient seen laying in bed this morning.  She states she slept fairly overnight. Discussed second Covid Vaccine with nursing. Patient's daughter to obtain information regarding timing and brand of first vaccine.   ROS: Denies CP, SOB, N/V/D  Objective:   No results found. Recent Labs    11/03/19 1159  WBC 5.5  HGB 11.6*  HCT 38.9  PLT 153   No results for input(s): NA, K, CL, CO2, GLUCOSE, BUN, CREATININE, CALCIUM in the last 72 hours.  Intake/Output Summary (Last 24 hours) at 11/04/2019 0912 Last data filed at 11/03/2019 1808 Gross per 24 hour  Intake 15 ml  Output --  Net 15 ml     Physical Exam: Vital Signs Blood pressure 138/84, pulse (!) 54, temperature 98.2 F (36.8 C), resp. rate 16, height 4\' 11"  (1.499 m), weight 61.4 kg, SpO2 98 %.  Constitutional: No distress . Vital signs reviewed. HENT: Normocephalic.  Atraumatic. Neck: +Stoma, trach collar. Eyes: EOMI. No discharge. Cardiovascular: No JVD.  RRR. Respiratory: Normal effort.  No stridor.  Bilateral clear to auscultation. GI: Non-distended.  BS +. Skin: Warm and dry.  Intact. Psych: Normal mood.  Normal behavior. Musc: No edema in extremities.  No tenderness in extremities. Neuro: Alert Motor: 4+/5 grossly throughout, unchanged  Assessment/Plan: 1. Functional deficits secondary to debility which require 3+ hours per day of interdisciplinary therapy in a comprehensive inpatient rehab setting.  Physiatrist is providing close team supervision and 24 hour management of active medical problems listed below.  Physiatrist and rehab team continue to assess barriers to discharge/monitor patient progress toward functional and medical goals  Care Tool:  Bathing  Bathing activity did not occur: Refused Body parts bathed by patient: Right arm, Left arm, Chest, Abdomen, Front perineal area, Buttocks, Right upper leg, Left upper  leg, Face   Body parts bathed by helper: Right lower leg, Left lower leg     Bathing assist Assist Level: Contact Guard/Touching assist     Upper Body Dressing/Undressing Upper body dressing Upper body dressing/undressing activity did not occur (including orthotics): Refused What is the patient wearing?: Pull over shirt    Upper body assist Assist Level: Set up assist    Lower Body Dressing/Undressing Lower body dressing    Lower body dressing activity did not occur: Refused What is the patient wearing?: Pants, Incontinence brief     Lower body assist Assist for lower body dressing: Minimal Assistance - Patient > 75%     Toileting Toileting    Toileting assist Assist for toileting: Minimal Assistance - Patient > 75%     Transfers Chair/bed transfer  Transfers assist     Chair/bed transfer assist level: Contact Guard/Touching assist     Locomotion Ambulation   Ambulation assist      Assist level: Contact Guard/Touching assist Assistive device: Walker-rolling Max distance: 66ft   Walk 10 feet activity   Assist     Assist level: Contact Guard/Touching assist Assistive device: Walker-rolling   Walk 50 feet activity   Assist Walk 50 feet with 2 turns activity did not occur: Safety/medical concerns  Assist level: Contact Guard/Touching assist Assistive device: Walker-rolling    Walk 150 feet activity   Assist Walk 150 feet activity did not occur: Safety/medical concerns (decreased balance/postural control, generalized weakness, poor endurance)         Walk 10 feet on uneven surface  activity   Assist Walk 10 feet on uneven surfaces activity  did not occur: Safety/medical concerns (decreased balance/postural control, generalized weakness, poor endurance)   Assist level: Moderate Assistance - Patient - 50 - 74% Assistive device: Aeronautical engineer Will patient use wheelchair at discharge?: Yes Type of Wheelchair:  Manual    Wheelchair assist level: Supervision/Verbal cueing Max wheelchair distance: 44ft    Wheelchair 50 feet with 2 turns activity    Assist        Assist Level: Supervision/Verbal cueing   Wheelchair 150 feet activity     Assist      Assist Level: Total Assistance - Patient < 25%   Blood pressure 138/84, pulse (!) 54, temperature 98.2 F (36.8 C), resp. rate 16, height 4\' 11"  (1.499 m), weight 61.4 kg, SpO2 98 %.  Medical Problem List and Plan: 1.  Debility secondary to B/L pneumonia - prolonged hospitalization with hx of laryngectomy and chronic trach  Continue CIR  Continue to follow with palliative care discharge   Will likely administer Covid vaccine prior to discharge with anticipation of post-vaccine symptoms prior to d/c.   Plan for d/c tomorrow.    Will see patient for hospital follow up in 1 month post discharge 2.  H/o DVT/Antithrombotics: -anticoagulation:  Pharmaceutical: Other (comment)--on Eliquis             -antiplatelet therapy: will determine if needs 3. Pain Management: Denies pain 4. Mood: LCSW to follow for evaluation and support.              -antipsychotic agents: N/A 5. Neuropsych: This patient is capable of making decisions on her own behalf. 6. Skin/Wound Care: Routine pressure relief measures. Nystain cream underneath breasts for moisture 7. Fluids/Electrolytes/Nutrition: Monitor I/Os.  8. RLL HCAP: Cefepime completed 08/31. Saturating well.  9. COPD/mucus plugging:   Nebs weaned down to bid   Daily RT care ordered.   Increased Robitussin to 17mL q6H.   Chest PT ordered.  No repeat episodes of mucous plugging x ~1 week  Humidified oxygen dependent 10. Pre-renal azotemia: Resolved 11. Leucocytosis: Resolved 12. Mild anemia:   Hemoglobin 11.6 on 9/9  Continue to monitor 13. HTN: Monitor BP . Continue metoprolol bid.   Slightly labile, but relatively controlled on 9/10 14.  Sleep disturbance: melatonin prn ordered  Improving   15.  Bowel incontinence  Fiber added on 9/9, consistency improving  LOS: 10 days A FACE TO FACE EVALUATION WAS PERFORMED  Naphtali Riede Lorie Phenix 11/04/2019, 9:12 AM

## 2019-11-04 NOTE — Progress Notes (Signed)
While changing pt, pt started to desat to 77. Sat pt higher and oxygen saturation increased to 90. Pt had a hard mucus plus over stoma occluding stoma. Pt able to cough hard enough to dislodge mucus plug where this nurse could manually remove. Saturations increased to 98. Will administer scheduled cough medicine Will continue to monitor. Encouraged pt to increase fluid intake.

## 2019-11-04 NOTE — Progress Notes (Signed)
Occupational Therapy Discharge Summary  Patient Details  Name: Katelyn [name] MRN: [id number] Date of Birth: October 20, 1950   Patient has met 6 of 7 long term goals due to improved activity tolerance, ability to compensate for deficits and improved awareness.  Patient to discharge at overall Supervision bathing and UB dressing, CGA transfers with RW, Min A LB dressing level.  Patient's care partner is independent to provide the necessary physical assistance at discharge.  Patient's significant other, daughter, and granddaughter were providing supervision/assistance PTA and plan to return to providing assistance as needed.  Reasons goals not met: Pt currently requires physical assistance with hygiene post toileting due to nature of BM with frequent loose stools requiring assistance for thoroughness of hygiene.  Recommendation:  Patient will benefit from ongoing skilled OT services in home health setting to continue to advance functional skills in the area of BADL and Reduce care partner burden.  Equipment: No equipment provided  Reasons for discharge: treatment goals met and discharge from hospital  Patient/family agrees with progress made and goals achieved: Yes  OT Discharge Precautions/Restrictions  Precautions Precautions: Fall;Other (comment) Precaution Comments: Trach Restrictions Weight Bearing Restrictions: No General   Vital Signs Therapy Vitals Pulse Rate: (!) 56 BP: 138/84 Pain Pain Assessment Pain Scale: 0-10 Pain Score: 0-No pain ADL ADL Eating: Set up Where Assessed-Eating: Wheelchair Grooming: Setup Where Assessed-Grooming: Sitting at sink Upper Body Bathing: Setup Where Assessed-Upper Body Bathing: Sitting at sink Lower Body Bathing: Supervision/safety Where Assessed-Lower Body Bathing: Sitting at sink, Standing at sink Upper Body Dressing: Setup Where Assessed-Upper Body Dressing: Sitting at sink Lower Body Dressing: Minimal assistance Where  Assessed-Lower Body Dressing: Sitting at sink, Standing at sink Toileting: Minimal assistance Where Assessed-Toileting: Glass blower/designer: Therapist, music Method: Ambulating (with RW) Science writer: Grab bars Vision Baseline Vision/History: Wears glasses Wears Glasses: Reading only Patient Visual Report: No change from baseline Vision Assessment?: No apparent visual deficits Perception  Perception: Within Functional Limits Praxis Praxis: Intact Cognition Overall Cognitive Status: Within Functional Limits for tasks assessed Arousal/Alertness: Awake/alert Orientation Level: Oriented X4 Memory: Appears intact Awareness: Appears intact Problem Solving: Appears intact Safety/Judgment: Impaired Comments: poor safety awareness, but improved since eval Sensation Sensation Light Touch: Appears Intact Proprioception: Appears Intact Coordination Gross Motor Movements are Fluid and Coordinated: No Fine Motor Movements are Fluid and Coordinated: Yes Coordination and Movement Description: grossly uncoordinated due to previous CVA, R hip pain, decreased balance/postural control generalized weakness, and poor endurance Finger Nose Finger Test: WFL bilaterally Heel Shin Test: WFL bilaterally Motor  Motor Motor: Abnormal postural alignment and control Motor - Skilled Clinical Observations: grossly uncoordinated due to prior CVA, R hip pain, decreased balance/postural control, generalized weakness, poor endurance Mobility  Bed Mobility Bed Mobility: Rolling Right;Rolling Left;Sit to Supine;Supine to Sit Rolling Right: Independent with assistive device Rolling Left: Independent with assistive device Supine to Sit: Supervision/Verbal cueing Sit to Supine: Supervision/Verbal cueing Transfers Sit to Stand: Supervision/Verbal cueing (RW) Stand to Sit: Supervision/Verbal cueing (RW)  Trunk/Postural Assessment  Cervical Assessment Cervical Assessment: Exceptions  to [facility] (forward head) Thoracic Assessment Thoracic Assessment: Exceptions to [facility] (kyphosis) Lumbar Assessment Lumbar Assessment: Exceptions to [facility] (posterior pelvic tilt) Postural Control Postural Control: Deficits on evaluation  Balance Balance Balance Assessed: Yes Static Sitting Balance Static Sitting - Balance Support: Feet supported;Bilateral upper extremity supported Static Sitting - Level of Assistance: 7: Independent Dynamic Sitting Balance Dynamic Sitting - Balance Support: Feet supported;Bilateral upper extremity supported Dynamic Sitting - Level of Assistance: 7: Independent Static  Standing Balance Static Standing - Balance Support: Bilateral upper extremity supported (RW) Static Standing - Level of Assistance: 5: Stand by assistance (supervision) Dynamic Standing Balance Dynamic Standing - Balance Support: Bilateral upper extremity supported (RW) Dynamic Standing - Level of Assistance: 5: Stand by assistance (supervision) Extremity/Trunk Assessment RUE Assessment RUE Assessment: Within Functional Limits Active Range of Motion (AROM) Comments: WFL General Strength Comments: 5-/5 in biceps, triceps; and 4+/5 in wirst, grip and finger LUE Assessment LUE Assessment: Within Functional Limits Active Range of Motion (AROM) Comments: WFL General Strength Comments: 5-/5 in biceps, triceps; and 4+/5 in wrist, grip and finger   Enos Muhl 11/04/2019, 8:28 AM

## 2019-11-04 NOTE — Progress Notes (Signed)
Physical Therapy Discharge Summary  Patient Details  Name: Katelyn [name] MRN: [id number] Date of Birth: [birth year]  Today's Date: 11/04/2019 PT Individual Time: 1300-1340 PT Individual Time Calculation (min): 40 min  and Today's Date: 11/04/2019 PT Missed Time: 20 Minutes Missed Time Reason: Other (Comment);Patient ill (Comment) (stomach discomfort)  Patient has met 10 of 10 long term goals due to improved activity tolerance, improved balance, improved postural control, increased strength, improved awareness and improved coordination. Patient to discharge at a wheelchair level Supervision/CGA. Patient's care partner is independent to provide the necessary physical assistance at discharge. Pt's significant other did not attend family education training, however was present during a few therapy sessions and verbalized confidence with all tasks to ensure safe discharge home as well as ensure daily trach care as he was providing 24/7 assist prior to hospital admission.   All goals met  Recommendation:  Patient will benefit from ongoing skilled PT services in home health setting to continue to advance safe functional mobility, address ongoing impairments in transfers, generalized strengthening, dynamic standing balance/coordination, ambulation, endurance, and to minimize fall risk.  Equipment: No equipment provided  Reasons for discharge: treatment goals met  Patient/family agrees with progress made and goals achieved: Yes  Today's Interventions: Received pt sitting in WC finishing lunch with NT present in room, pt denied any pain during session but continues to report stomach discomfort stating she had diarrhea this morning. Pt with questions regarding medications for diarrhea and PA, Pam, notified and RN provided medication during session. Pt politely declined therapy stating "I want to get in bed." Therapist suggested attempting to have BM due to stomach pain and pt initially refusing.  However, with convincing pt agreed to use restroom. Pt transferred WC<>bed stand<>pivot with RW and close supervision and ambulated 6ft x 2 trials to/from bathroom with RW and CGA with mod cues for RW safety when navigating threshold in bathroom. Pt required mod A for clothing management due to urgency. Pt with loose stringy BM (more solid than yesterday and PA made aware). Pt requested to put on gown prior to getting in bed and doffed shirt and pants with mod A and donned gown max A. Pt transferred sit<>stand with RW and supervision and able to perform peri-care in the front with CGA but required total A for peri-care in the back. Pt performed lateral side stepping to R to [facility] with CGA and transferred sit<>supine with min A for LE management as pt requesting therapist assist with LEs this time. Pt scooted to [facility] with max A. Concluded session with pt supine in bed, needs within reach, and bed alarm on. 20 minutes missed of skilled physical therapy due to stomach discomfort.   PT Discharge Precautions/Restrictions Precautions Precautions: Fall;Other (comment) Precaution Comments: Trach Restrictions Weight Bearing Restrictions: No Cognition Overall Cognitive Status: Within Functional Limits for tasks assessed Arousal/Alertness: Awake/alert Orientation Level: Oriented X4 Memory: Appears intact Awareness: Appears intact Problem Solving: Appears intact Safety/Judgment: Impaired Comments: poor safety awareness, but improved since eval Sensation Sensation Light Touch: Appears Intact Proprioception: Appears Intact Coordination Gross Motor Movements are Fluid and Coordinated: No Fine Motor Movements are Fluid and Coordinated: Yes Coordination and Movement Description: grossly uncoordinated due to previous CVA, R hip pain, decreased balance/postural control generalized weakness, and poor endurance Finger Nose Finger Test: [facility] bilaterally Heel Shin Test: [facility] bilaterally Motor  Motor Motor:  Abnormal postural alignment and control Motor - Skilled Clinical Observations: grossly uncoordinated due to prior CVA, R hip pain, decreased balance/postural control, generalized weakness,  poor endurance  Mobility Bed Mobility Bed Mobility: Rolling Right;Rolling Left;Sit to Supine;Supine to Sit Rolling Right: Independent with assistive device Rolling Left: Independent with assistive device Supine to Sit: Supervision/Verbal cueing Sit to Supine: Supervision/Verbal cueing Transfers Transfers: Sit to Stand;Stand to Sit;Stand Pivot Transfers Sit to Stand: Supervision/Verbal cueing (RW) Stand to Sit: Supervision/Verbal cueing (RW) Stand Pivot Transfers: Supervision/Verbal cueing (RW) Stand Pivot Transfer Details: Verbal cues for precautions/safety;Verbal cues for safe use of DME/AE Stand Pivot Transfer Details (indicate cue type and reason): verbal cues for RW safety Transfer (Assistive device): Rolling walker Locomotion  Gait Ambulation: Yes Gait Assistance: Contact Guard/Touching assist Gait Distance (Feet): 55 Feet Assistive device: Rolling walker Gait Assistance Details: Verbal cues for precautions/safety;Verbal cues for safe use of DME/AE Gait Assistance Details: verbal cues to remain close to RW, for upright posture, and to slow cadence Gait Gait: Yes Gait Pattern: Impaired Gait Pattern: Decreased stance time - right;Trendelenburg;Decreased trunk rotation;Wide base of support;Step-to pattern;Decreased dorsiflexion - right;Decreased stride length;Decreased weight shift to right;Decreased step length - right;Decreased step length - left;Trunk flexed;Poor foot clearance - left;Poor foot clearance - right Gait velocity: decreased Stairs / Additional Locomotion Stairs: Yes Stairs Assistance: Minimal Assistance - Patient > 75% Stair Management Technique: Two rails Number of Stairs: 3 Height of Stairs: 6 Ramp: Moderate Assistance - Patient 50 - 74% (RW) Product manager  Mobility: Yes Wheelchair Assistance: Chartered loss adjuster: Both upper extremities Wheelchair Parts Management: Needs assistance Distance: 68f  Trunk/Postural Assessment  Cervical Assessment Cervical Assessment: Exceptions to [facility]) Thoracic Assessment Thoracic Assessment: Exceptions to [facility]) Lumbar Assessment Lumbar Assessment: Exceptions to [facility](post[facility] tilt) Postural Control Postural Control: Deficits on evaluation  Balance Balance Balance Assessed: Yes Static Sitting Balance Static Sitting - Balance Support: Feet supported;Bilateral upper extremity supported Static Sitting - Level of Assistance: 7: Independent Dynamic Sitting Balance Dynamic Sitting - Balance Support: Feet supported;Bilateral upper extremity supported Dynamic Sitting - Level of Assistance: 7: Independent Static Standing Balance Static Standing - Balance Support: Bilateral upper extremity supported (RW) Static Standing - Level of Assistance: 5: Stand by assistance (supervision) Dynamic Standing Balance Dynamic Standing - Balance Support: Bilateral upper extremity supported (RW) Dynamic Standing - Level of Assistance: 5: Stand by assistance (supervision) Extremity Assessment  RLE Assessment RLE Assessment: Exceptions to [facility] Strength Comments: grossly generalized to 3+/5 (except knee flexion 3/5) LLE Assessment LLE Assessment: Exceptions to [facility]Pr[facility] LPGeneral Strength Comments: grossly generalized to 4/5 (except knee flexion 3/5)   AAlfonse AlpersPT, DPT  11/04/2019, 7:46 AM

## 2019-11-04 NOTE — Discharge Summary (Addendum)
Physician Discharge Summary  Patient ID: Katelyn [name] MRN: [id number] DOB/AGE: Oct 09, 1950 69 y.o.  Admit date: 10/25/2019 Discharge date: 11/05/2019  Discharge Diagnoses:  Principal Problem:   Debility Active Problems:   Chronic obstructive pulmonary disease (HCC)   Benign essential HTN   Loose stools   Discharged Condition: good  Significant Diagnostic Studies: N/A   Labs:  Basic Metabolic Panel: Recent Labs  Lab 10/31/19 0523  NA 141  K 4.4  CL 107  CO2 24  GLUCOSE 81  BUN 13  CREATININE 0.89  CALCIUM 8.9    CBC: Recent Labs  Lab 10/31/19 0523 11/03/19 1159  WBC 4.7 5.5  HGB 9.9* 11.6*  HCT 33.7* 38.9  MCV 94.9 97.5  PLT 156 153    CBG: No results for input(s): GLUCAP in the last 168 hours.  Brief HPI:   Katelyn [name] is a 70 y.o. female with history of HTN, CVA, DVT, laryngeal cancer s/p total laryngectomy with neck dissection and chronic trach, recent admission for acute on chronic hypercarbic respiratory failure and BL LE aspiration PNA.  She was readmitted on 10/20/2019 with dyspnea and hypoxia.  She was started on broad-spectrum antibiotics as chest x-ray showed RLL infiltrate as well as Solu-Medrol due to concerns of COPD exacerbation.  She was finishing her course of antibiotics with HCAP and steroids DC'd as respiratory status was improving.  Therapy initiated and she was noted to have issues with unsteady gait, hypoxia with activity as well as debility.  CIR was recommended due to functional decline.   Hospital Course: Katelyn [name] was admitted to rehab 10/25/2019 for inpatient therapies to consist of PT, ST and OT at least three hours five days a week. Past admission physiatrist, therapy team and rehab RN have worked together to provide customized collaborative inpatient rehab.She completed her course of cefepime on 08/31 respiratory status has been stable and she has not required use of nebulizers during his stay. Robitussin was increased  to every 6 hours and humidification has been used during her stay.  She was also educated on importance of cleansing stoma of any drainage to prevent crusting and blockage.  Follow-up labs showed prerenal azotemia as well as leukocytosis has resolved.  Her blood pressures were monitored on TID basis and has been controlled on metoprolol twice daily.  She was noted to have issues with constipation therefore was treated with dose of MiraLAX.  However this resulted in multiple days of loose stool.  She was started on FiberCon as well as Lomotil with improvement in her symptoms.  She was advised to continue FiberCon pills to use back to normal consistency.  She has made good gains during her rehab stay and is currently at contact-guard assist to supervision level at discharge.    Rehab course: During patient's stay in rehab weekly team conferences were held to monitor patient's progress, set goals and discuss barriers to discharge. At admission, patient required mod assist with mobility and with ADL tasks. She  has had improvement in activity tolerance, balance, postural control as well as ability to compensate for deficits.  She requires supervision with bathing and upper body dressing and min assist for lower body dressing.  She requires supervision for transfers and is able to ambulate 55 feet with contact-guard assist and use of rolling walker. Family education has been completed regarding all aspects of safety and care.  Disposition: Home  Diet: Soft foods.   Special Instructions: 1. Drink plenty of fluids. 2. Need to use humidification to  trach. 3. Need to perform stoma care couple of times a day.   Discharge Instructions     Ambulatory referral to Physical Medicine Rehab   Complete by: As directed    1-2 weeks TC appt      Allergies as of 11/05/2019   No Known Allergies      Medication List     STOP taking these medications    ipratropium-albuterol 0.5-2.5 (3) MG/3ML Soln Commonly  known as: DUONEB   polyethylene glycol 17 g packet Commonly known as: MIRALAX / GLYCOLAX   sodium chloride HYPERTONIC 3 % nebulizer solution       TAKE these medications    ALPRAZolam 0.5 MG tablet Commonly known as: XANAX Take 1 tablet (0.5 mg total) by mouth 3 (three) times daily as needed for anxiety.   apixaban 5 MG Tabs tablet Commonly known as: ELIQUIS Take 1 tablet (5 mg total) by mouth 2 (two) times daily.   atorvastatin 80 MG tablet Commonly known as: LIPITOR Take 1 tablet (80 mg total) by mouth daily.   guaiFENesin-dextromethorphan 100-10 MG/5ML syrup Commonly known as: ROBITUSSIN DM Take 10 mLs by mouth every 6 (six) hours.   melatonin 3 MG Tabs tablet Take 1 tablet (3 mg total) by mouth at bedtime as needed (insomnia).   metoprolol tartrate 25 MG tablet Commonly known as: LOPRESSOR Take 1 tablet (25 mg total) by mouth 2 (two) times daily.   nystatin cream Commonly known as: MYCOSTATIN Apply 1 application topically 2 (two) times daily.   polycarbophil 625 MG tablet Commonly known as: FIBERCON Take 1 tablet (625 mg total) by mouth daily.        Follow-up Information     Jamse Arn, MD Follow up.   Specialty: Physical Medicine and Rehabilitation Why: Office will call you with follow up appointment Contact information: [address] [place] [address] [phone]         Aretta Nip, MD. Call.   Specialty: Family Medicine Why: for post hospital follow up Contact information: [place] [address] [phone]         Lauraine Rinne, NP Follow up on 11/10/2019.   Specialty: Pulmonary Disease Why: Appointment at 11 am/follow up on lungs/trach Contact information: [address] [place] [address] [phone]                 Signed: Bary Leriche 11/06/2019, 10:52 PM Patient was seen, face-face, and physical exam performed by me on day of discharge, greater than  30 minutes of total time spent.. Please see progress note from day of discharge as well.  Delice Lesch, MD, ABPMR

## 2019-11-04 NOTE — Progress Notes (Signed)
Inpatient Rehabilitation Care Coordinator  Discharge Note  The overall goal for the admission was met for: DC SAT 9/11  Discharge location: Yes-HOME WITH BOYFRIEND WHO ASSISTED PRIOR TO ADMISSION-24 HR  Length of Stay: Yes-11 DAYS  Discharge activity level: Yes-SUPERVISION-MIN ASSIST LEVEL  Home/community participation: Yes  Services provided included: MD, RD, PT, OT, RN, CM, TR, Pharmacy and SW  Financial Services: Medicare  Follow-up services arranged: Home Health: [facility]-PT, OT, RN and Patient/Family request agency HH: ACTIVE PT WITH [place], DME: HAS ALL NEEDED EQUIPMENT FROM PAST ADMITS . UNABLE TO GET A WHEELCHAIR FOR PT DUE TO HAD NE AND RETURNED IT AND IT ACTUALLY WAS PURCHASED FOR HER AND CAN NOT GET IT BACK TOO LONG AGO. ONLY OPTION IS TO RENT ONE MONTHLY FROM DME AGENCY. PT AWARE OF THIS.  Comments (or additional information):BOYFREIND WAS ASSISTING PRIOR TO ADMISSION WILL CONTINUE, DAUGHTER'S ALSO ASSIST. ENCOURAGED TO APPLY FOR MEDICAID FOR ASSIST WITH MEDICATIONS AND SIGN UP FOR MEDICARE PART D  Patient/Family verbalized understanding of follow-up arrangements: Yes  Individual responsible for coordination of the follow-up plan: SELF & [name]-GRANDDAUGHTER-HCPOA [phone]  Confirmed correct DME delivered: Elease Hashimoto 11/04/2019    Elease Hashimoto

## 2019-11-05 NOTE — Progress Notes (Addendum)
[facility] PHYSICAL MEDICINE & REHABILITATION PROGRESS NOTE   Subjective/Complaints: Patient seen sitting up in bed this morning.  She indicates she slept fairly overnight.  She notes diarrhea, on further questioning patient had 2 loose stools yesterday, but notes improvement with medication changes.  ROS: Denies CP, SOB, N/V/D  Objective:   No results found. Recent Labs    11/03/19 1159  WBC 5.5  HGB 11.6*  HCT 38.9  PLT 153   No results for input(s): NA, K, CL, CO2, GLUCOSE, BUN, CREATININE, CALCIUM in the last 72 hours.  Intake/Output Summary (Last 24 hours) at 11/05/2019 1255 Last data filed at 11/05/2019 0820 Gross per 24 hour  Intake 190 ml  Output --  Net 190 ml     Physical Exam: Vital Signs Blood pressure (!) 121/93, pulse 81, temperature 97.7 F (36.5 C), temperature source Oral, resp. rate 18, height 4\' 11"  (1.499 m), weight 61.4 kg, SpO2 95 %.  Constitutional: No distress . Vital signs reviewed. HENT: Normocephalic.  Atraumatic. Neck: + Stoma Eyes: EOMI. No discharge. Cardiovascular: No JVD.  RRR. Respiratory: Normal effort.  No stridor.  Bilateral clear to auscultation. GI: Non-distended.  BS +. Skin: Warm and dry.  Intact. Psych: Normal mood.  Normal behavior. Musc: No edema in extremities.  No tenderness in extremities. Neuro: Alert Motor: 4+/5 grossly throughout, unchanged  Assessment/Plan: 1. Functional deficits secondary to debility which require 3+ hours per day of interdisciplinary therapy in a comprehensive inpatient rehab setting.  Physiatrist is providing close team supervision and 24 hour management of active medical problems listed below.  Physiatrist and rehab team continue to assess barriers to discharge/monitor patient progress toward functional and medical goals  Care Tool:  Bathing  Bathing activity did not occur: Refused Body parts bathed by patient: Right arm, Left arm, Chest, Abdomen, Front perineal area, Buttocks, Right upper  leg, Left upper leg, Face   Body parts bathed by helper: Right lower leg, Left lower leg     Bathing assist Assist Level: Supervision/Verbal cueing     Upper Body Dressing/Undressing Upper body dressing Upper body dressing/undressing activity did not occur (including orthotics): Refused What is the patient wearing?: Pull over shirt    Upper body assist Assist Level: Set up assist    Lower Body Dressing/Undressing Lower body dressing    Lower body dressing activity did not occur: Refused What is the patient wearing?: Pants, Incontinence brief     Lower body assist Assist for lower body dressing: Minimal Assistance - Patient > 75%     Toileting Toileting    Toileting assist Assist for toileting: Minimal Assistance - Patient > 75%     Transfers Chair/bed transfer  Transfers assist     Chair/bed transfer assist level: Supervision/Verbal cueing     Locomotion Ambulation   Ambulation assist      Assist level: Contact Guard/Touching assist Assistive device: Walker-rolling Max distance: 49ft   Walk 10 feet activity   Assist     Assist level: Contact Guard/Touching assist Assistive device: Walker-rolling   Walk 50 feet activity   Assist Walk 50 feet with 2 turns activity did not occur: Safety/medical concerns  Assist level: Contact Guard/Touching assist Assistive device: Walker-rolling    Walk 150 feet activity   Assist Walk 150 feet activity did not occur: Safety/medical concerns (fatigue, deconditioning, weakness, R hip pain)         Walk 10 feet on uneven surface  activity   Assist Walk 10 feet on uneven surfaces activity  did not occur: Safety/medical concerns (decreased balance/postural control, generalized weakness, poor endurance)   Assist level: Moderate Assistance - Patient - 50 - 74% Assistive device: Aeronautical engineer Will patient use wheelchair at discharge?: Yes Type of Wheelchair: Manual     Wheelchair assist level: Supervision/Verbal cueing Max wheelchair distance: 8ft    Wheelchair 50 feet with 2 turns activity    Assist        Assist Level: Supervision/Verbal cueing   Wheelchair 150 feet activity     Assist      Assist Level: Maximal Assistance - Patient 25 - 49%   Blood pressure (!) 121/93, pulse 81, temperature 97.7 F (36.5 C), temperature source Oral, resp. rate 18, height 4\' 11"  (1.499 m), weight 61.4 kg, SpO2 95 %.  Medical Problem List and Plan: 1.  Debility secondary to B/L pneumonia - prolonged hospitalization with hx of laryngectomy and chronic trach  Continue to follow with palliative care discharge   DC today  Will see patient for hospital follow up in 1 month post discharge 2.  H/o DVT/Antithrombotics: -anticoagulation:  Pharmaceutical: Other (comment)--on Eliquis             -antiplatelet therapy: will determine if needs 3. Pain Management: Denies pain 4. Mood: LCSW to follow for evaluation and support.              -antipsychotic agents: N/A 5. Neuropsych: This patient is capable of making decisions on her own behalf. 6. Skin/Wound Care: Routine pressure relief measures. Nystain cream underneath breasts for moisture 7. Fluids/Electrolytes/Nutrition: Monitor I/Os.  8. RLL HCAP: Cefepime completed 08/31. Saturating well.  9. COPD/mucus plugging:   Nebs weaned down to bid   Daily RT care ordered.   Increased Robitussin to 46mL q6H.   Chest PT ordered.  No repeat episodes of mucous plugging x ~1 week  Humidified oxygen dependent 10. Pre-renal azotemia: Resolved 11. Leucocytosis: Resolved 12. Mild anemia:   Hemoglobin 11.6 on 9/9  Continue to monitor 13. HTN: Monitor BP . Continue metoprolol bid.   Controlled on 9/11 14.  Sleep disturbance: melatonin prn ordered  Improving  15.  Bowel incontinence  Fiber added on 9/9, consistency improving  > 30 minutes spent in total in discharge planning between myself and PA regarding  aforementioned, as well discussion regarding DME equipment, follow-up appointments, follow-up therapies, discharge medications, discharge recommendations  LOS: 11 days A FACE TO FACE EVALUATION WAS PERFORMED  Mcgregor Tinnon Lorie Phenix 11/05/2019, 12:55 PM

## 2019-11-05 NOTE — Progress Notes (Signed)
Patient discharged home with family, no s/s of distress noted. No complications noted at this time. Respiratory notified of discharge. Audie Clear, LPN

## 2019-11-06 DIAGNOSIS — N1831 Chronic kidney disease, stage 3a: Secondary | ICD-10-CM | POA: Diagnosis not present

## 2019-11-06 DIAGNOSIS — J15211 Pneumonia due to Methicillin susceptible Staphylococcus aureus: Secondary | ICD-10-CM | POA: Diagnosis not present

## 2019-11-06 DIAGNOSIS — I5030 Unspecified diastolic (congestive) heart failure: Secondary | ICD-10-CM | POA: Diagnosis not present

## 2019-11-06 DIAGNOSIS — R195 Other fecal abnormalities: Secondary | ICD-10-CM

## 2019-11-06 DIAGNOSIS — J441 Chronic obstructive pulmonary disease with (acute) exacerbation: Secondary | ICD-10-CM | POA: Diagnosis not present

## 2019-11-06 DIAGNOSIS — J189 Pneumonia, unspecified organism: Secondary | ICD-10-CM | POA: Diagnosis not present

## 2019-11-06 DIAGNOSIS — J9621 Acute and chronic respiratory failure with hypoxia: Secondary | ICD-10-CM | POA: Diagnosis not present

## 2019-11-06 DIAGNOSIS — I69351 Hemiplegia and hemiparesis following cerebral infarction affecting right dominant side: Secondary | ICD-10-CM | POA: Diagnosis not present

## 2019-11-06 DIAGNOSIS — I130 Hypertensive heart and chronic kidney disease with heart failure and stage 1 through stage 4 chronic kidney disease, or unspecified chronic kidney disease: Secondary | ICD-10-CM | POA: Diagnosis not present

## 2019-11-06 DIAGNOSIS — J9622 Acute and chronic respiratory failure with hypercapnia: Secondary | ICD-10-CM | POA: Diagnosis not present

## 2019-11-06 DIAGNOSIS — D631 Anemia in chronic kidney disease: Secondary | ICD-10-CM | POA: Diagnosis not present

## 2019-11-06 DIAGNOSIS — J440 Chronic obstructive pulmonary disease with acute lower respiratory infection: Secondary | ICD-10-CM | POA: Diagnosis not present

## 2019-11-07 ENCOUNTER — Telehealth: Payer: Self-pay | Admitting: *Deleted

## 2019-11-07 ENCOUNTER — Telehealth: Payer: Self-pay

## 2019-11-07 DIAGNOSIS — J441 Chronic obstructive pulmonary disease with (acute) exacerbation: Secondary | ICD-10-CM | POA: Diagnosis not present

## 2019-11-07 DIAGNOSIS — J440 Chronic obstructive pulmonary disease with acute lower respiratory infection: Secondary | ICD-10-CM | POA: Diagnosis not present

## 2019-11-07 DIAGNOSIS — J9621 Acute and chronic respiratory failure with hypoxia: Secondary | ICD-10-CM | POA: Diagnosis not present

## 2019-11-07 DIAGNOSIS — I130 Hypertensive heart and chronic kidney disease with heart failure and stage 1 through stage 4 chronic kidney disease, or unspecified chronic kidney disease: Secondary | ICD-10-CM | POA: Diagnosis not present

## 2019-11-07 DIAGNOSIS — J15211 Pneumonia due to Methicillin susceptible Staphylococcus aureus: Secondary | ICD-10-CM | POA: Diagnosis not present

## 2019-11-07 DIAGNOSIS — J9622 Acute and chronic respiratory failure with hypercapnia: Secondary | ICD-10-CM | POA: Diagnosis not present

## 2019-11-07 NOTE — Telephone Encounter (Signed)
(  5:10p) Telephone call to patient to schedule palliative care visit with patient. SW spoke to unknown female who would not identify himself. The unknown female decline services stated "she don't need yall".   *Primary RN updated.

## 2019-11-07 NOTE — Telephone Encounter (Signed)
[facility] called for POC but when I called back he says they changed their mind and only want PT.

## 2019-11-08 ENCOUNTER — Telehealth: Payer: Self-pay

## 2019-11-08 NOTE — Telephone Encounter (Signed)
No answer and voicemail box full.

## 2019-11-10 ENCOUNTER — Encounter (HOSPITAL_COMMUNITY): Payer: Self-pay

## 2019-11-10 ENCOUNTER — Emergency Department (HOSPITAL_COMMUNITY): Payer: Medicare Other

## 2019-11-10 ENCOUNTER — Emergency Department (HOSPITAL_COMMUNITY)
Admission: EM | Admit: 2019-11-10 | Discharge: 2019-11-10 | Disposition: A | Discharge status: Home / Self Care | Payer: Medicare Other | Source: Home / Self Care | Attending: Emergency Medicine | Admitting: Emergency Medicine

## 2019-11-10 ENCOUNTER — Inpatient Hospital Stay: Payer: Medicare Other | Admitting: Primary Care

## 2019-11-10 ENCOUNTER — Inpatient Hospital Stay: Payer: Medicare Other | Admitting: Pulmonary Disease

## 2019-11-10 DIAGNOSIS — Z20822 Contact with and (suspected) exposure to covid-19: Secondary | ICD-10-CM | POA: Diagnosis not present

## 2019-11-10 DIAGNOSIS — I129 Hypertensive chronic kidney disease with stage 1 through stage 4 chronic kidney disease, or unspecified chronic kidney disease: Secondary | ICD-10-CM | POA: Insufficient documentation

## 2019-11-10 DIAGNOSIS — J9621 Acute and chronic respiratory failure with hypoxia: Secondary | ICD-10-CM | POA: Diagnosis not present

## 2019-11-10 DIAGNOSIS — I10 Essential (primary) hypertension: Secondary | ICD-10-CM | POA: Diagnosis not present

## 2019-11-10 DIAGNOSIS — J9811 Atelectasis: Secondary | ICD-10-CM | POA: Diagnosis not present

## 2019-11-10 DIAGNOSIS — J440 Chronic obstructive pulmonary disease with acute lower respiratory infection: Secondary | ICD-10-CM | POA: Diagnosis not present

## 2019-11-10 DIAGNOSIS — R0902 Hypoxemia: Secondary | ICD-10-CM

## 2019-11-10 DIAGNOSIS — Z87891 Personal history of nicotine dependence: Secondary | ICD-10-CM | POA: Insufficient documentation

## 2019-11-10 DIAGNOSIS — J9602 Acute respiratory failure with hypercapnia: Secondary | ICD-10-CM | POA: Insufficient documentation

## 2019-11-10 DIAGNOSIS — R918 Other nonspecific abnormal finding of lung field: Secondary | ICD-10-CM | POA: Diagnosis not present

## 2019-11-10 DIAGNOSIS — J441 Chronic obstructive pulmonary disease with (acute) exacerbation: Secondary | ICD-10-CM | POA: Insufficient documentation

## 2019-11-10 DIAGNOSIS — Z79899 Other long term (current) drug therapy: Secondary | ICD-10-CM | POA: Insufficient documentation

## 2019-11-10 DIAGNOSIS — N183 Chronic kidney disease, stage 3 unspecified: Secondary | ICD-10-CM | POA: Insufficient documentation

## 2019-11-10 DIAGNOSIS — Z4682 Encounter for fitting and adjustment of non-vascular catheter: Secondary | ICD-10-CM | POA: Diagnosis not present

## 2019-11-10 DIAGNOSIS — E43 Unspecified severe protein-calorie malnutrition: Secondary | ICD-10-CM | POA: Diagnosis not present

## 2019-11-10 DIAGNOSIS — J15211 Pneumonia due to Methicillin susceptible Staphylococcus aureus: Secondary | ICD-10-CM | POA: Diagnosis not present

## 2019-11-10 DIAGNOSIS — R000 Tachycardia, unspecified: Secondary | ICD-10-CM | POA: Diagnosis not present

## 2019-11-10 DIAGNOSIS — J9622 Acute and chronic respiratory failure with hypercapnia: Secondary | ICD-10-CM | POA: Diagnosis not present

## 2019-11-10 DIAGNOSIS — R0602 Shortness of breath: Secondary | ICD-10-CM | POA: Diagnosis not present

## 2019-11-10 DIAGNOSIS — R001 Bradycardia, unspecified: Secondary | ICD-10-CM | POA: Diagnosis not present

## 2019-11-10 NOTE — ED Notes (Signed)
SATURATION QUALIFICATIONS: (This note is used to comply with regulatory documentation for home oxygen)  Patient Saturations on Room Air at Rest = 80%  Patient Saturations on Room Air while Ambulating = 80%  Patient Saturations on 10 Liters of oxygen while Ambulating = 94%  Please briefly explain why patient needs home oxygen: Unable to maintain Sp02 within normal range on room air along w/ shortness of breathe.

## 2019-11-10 NOTE — ED Provider Notes (Signed)
[facility] EMERGENCY DEPARTMENT Provider Note   CSN: [id number] Arrival date & time: 11/10/19  9983     History Chief Complaint  Patient presents with  . Shortness of Breath    Katelyn [name] is a 69 y.o. female.  69 yo F with a chief complaint of shortness of breath. Patient states that she started having trouble breathing last night. Was just discharged from the hospital. This was to be sent home with oxygen but never was. EMS arrival the patient's oxygen saturation was in the 80s. Started on nonrebreather. Patient has a chronic trach. Not cannulated. She denies worsening cough. Denies fevers.  The history is provided by the patient.  Shortness of Breath Severity:  Moderate Onset quality:  Gradual Duration:  1 day Timing:  Constant Progression:  Unchanged Chronicity:  New Relieved by:  Nothing Worsened by:  Nothing Ineffective treatments:  None tried Associated symptoms: no chest pain, no fever, no headaches, no vomiting and no wheezing        Past Medical History:  Diagnosis Date  . Acute gallstone pancreatitis 01/08/2014  . AKI (acute kidney injury) ([place]) 01/08/2014  . Anemia   . Blood transfusion    11'15 last admission  . Cancer ([place])   . Cholecystitis   . H/O tracheostomy   . Hip fracture ([place])   . Hypercholesteremia   . Hypertension   . Right leg DVT ([place]) 05/08/2014  . Septic shock - Kelbsiella 01/09/2014  . Shortness of breath   . Stroke [facility])    '13- right side weakness, ambulates with cane    Patient Active Problem List   Diagnosis Date Noted  . Loose stools 11/06/2019  . Benign essential HTN   . Chronic obstructive pulmonary disease ([place])   . Debility 10/25/2019  . Acute hypoxemic respiratory failure ([place]) 10/21/2019  . Goals of care, counseling/discussion   . Palliative care by specialist   . History of noncompliance with medical treatment   . COPD exacerbation ([place]) 10/06/2019  . Generalized weakness 07/12/2017  .  Palliative care encounter 07/12/2017  . 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 Pressure injury of skin 05/30/2017  . Malnutrition of moderate degree 05/30/2017  . Hip fx, right, closed, initial encounter ([place]) 05/29/2017  . Closed displaced intertrochanteric fracture of right femur ([place]) 05/29/2017  . Dysphagia 05/29/2017  . Weight loss 05/29/2017  . Difficult airway for intubation 05/29/2017  . Lesion of epiglottis 05/26/2014  . Abnormal CT scan, pancreas or bile duct   . Right leg DVT ([place]) 05/08/2014  . Anemia 01/09/2014  . Hip pain, right 05/06/2011  . 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 CKD (chronic kidney disease) stage 3, GFR 30-59 ml/min (HCC) 03/04/2011  . Diastolic dysfunction 38/25/0539  . Tobacco abuse 03/04/2011  . Constipation 03/04/2011  . Malignant hypertensive urgency 03/01/2011  . Cerebral infarction ([place]) 03/01/2011  . NEUROPATHY, IDIOPATHIC 10/25/2007  . Hypercholesteremia 12/15/2006  . Essential hypertension 12/15/2006    Past Surgical History:  Procedure Laterality Date  . ABDOMINAL HYSTERECTOMY    . DIRECT LARYNGOSCOPY N/A 06/08/2017   Procedure: DIRECT LARYNGOSCOPY WITH BIOPSY;  Surgeon: Helayne Seminole, MD;  Location: [facility];  Service: ENT;  Laterality: N/A;  . ENDOSCOPIC RETROGRADE CHOLANGIOPANCREATOGRAPHY (ERCP) WITH PROPOFOL N/A 01/11/2014   Procedure: ENDOSCOPIC RETROGRADE CHOLANGIOPANCREATOGRAPHY (ERCP) WITH PROPOFOL;  Surgeon: Gatha Mayer, MD;  Location: [place];  Service: Endoscopy;  Laterality: N/A;  . ERCP    . ERCP N/A 05/26/2014   Procedure:  ENDOSCOPIC RETROGRADE CHOLANGIOPANCREATOGRAPHY (ERCP);  Surgeon: Gatha Mayer, MD;  Location: [facility];  Service: Endoscopy;  Laterality: N/A;  . ESOPHAGOGASTRODUODENOSCOPY (EGD) WITH PROPOFOL N/A 12/07/2014   Procedure: ESOPHAGOGASTRODUODENOSCOPY (EGD) WITH PROPOFOL;  Surgeon: Milus Banister, MD;  [facility] ENDOSCOPY;  Service: Endoscopy;  Laterality: N/A;  . EUS N/A  12/07/2014   Procedure: UPPER ENDOSCOPIC ULTRASOUND (EUS) LINEAR;  Surgeon: Milus Banister, MD;  [facility] END[facility]COPY;  Service: Endoscopy;  Laterality: N/A;  . GASTROINTESTINAL STENT REMOVAL N/A 12/07/2014   Procedure: GASTROINTESTINAL STENT REMOVAL;  Surgeon: Milus Banister, MD;  [facility];  Service: Endoscopy;  Laterality: N/A;  pancreatic stent removal  . HIP FRACTURE SURGERY    . INTRAMEDULLARY (IM) NAIL INTERTROCHANTERIC Right 05/29/2017   Procedure: INTRAMEDULLARY (IM) NAIL INTERTROCHANTRIC FEMUR;  Surgeon: Nicholes Stairs, MD;  Location: [place];  Service: Orthopedics;  Laterality: Right;  . PERCUTANEOUS TRACHEOSTOMY N/A 05/29/2017   Procedure: PERCUTANEOUS TRACHEOSTOMY;  Surgeon: Judeth Horn, MD;  Location: [place];  Service: General;  Laterality: N/A;  . TEE WITHOUT CARDIOVERSION  03/06/2011   Procedure: TRANSESOPHAGEAL ECHOCARDIOGRAM (TEE);  Surgeon: Jolaine Artist, MD;  Location: [facility] ENDOSCOPY;  Service: Cardiovascular;  Laterality: N/A;     OB History   No obstetric history on file.     Family History  Problem Relation Age of Onset  . Breast cancer Maternal Aunt   . Diabetes Mellitus I Father   . Diabetes Mellitus I Sister   . Anesthesia problems Neg Hx   . Hypotension Neg Hx   . Malignant hyperthermia Neg Hx   . Pseudochol deficiency Neg Hx   . Colon cancer Neg Hx   . Esophageal cancer Neg Hx   . Pancreatic cancer Neg Hx   . Kidney disease Neg Hx   . Liver disease Neg Hx     Social History   Tobacco Use  . Smoking status: Former Smoker    Packs/day: 0.25    Types: Cigarettes    Quit date: 05/29/2017    Years since quitting: 2.4  . Smokeless tobacco: Never Used  Substance Use Topics  . Alcohol use: No    Alcohol/week: 0.0 standard drinks  . Drug use: No    Home Medications Prior to Admission medications   Medication Sig Start Date End Date Taking? Authorizing Provider  ALPRAZolam Duanne Moron) 0.5 MG tablet Take 1 tablet (0.5 mg total) by  mouth 3 (three) times daily as needed for anxiety. 10/18/19   Swayze, Ava, DO  apixaban (ELIQUIS) 5 MG TABS tablet Take 1 tablet (5 mg total) by mouth 2 (two) times daily. 11/04/19   Love, Ivan Anchors, PA-C  atorvastatin (LIPITOR) 80 MG tablet Take 1 tablet (80 mg total) by mouth daily. 10/18/19   Swayze, Ava, DO  guaiFENesin-dextromethorphan (ROBITUSSIN DM) 100-10 MG/5ML syrup Take 10 mLs by mouth every 6 (six) hours. 10/18/19   Swayze, Ava, DO  melatonin 3 MG TABS tablet Take 1 tablet (3 mg total) by mouth at bedtime as needed (insomnia). 11/04/19   Love, Ivan Anchors, PA-C  metoprolol tartrate (LOPRESSOR) 25 MG tablet Take 1 tablet (25 mg total) by mouth 2 (two) times daily. 11/04/19   Love, Ivan Anchors, PA-C  nystatin cream (MYCOSTATIN) Apply 1 application topically 2 (two) times daily. 11/04/19   Love, Ivan Anchors, PA-C  polycarbophil (FIBERCON) 625 MG tablet Take 1 tablet (625 mg total) by mouth daily. 11/05/19   Bary Leriche, PA-C    Allergies    Patient  has no known allergies.  Review of Systems   Review of Systems  Constitutional: Negative for chills and fever.  HENT: Negative for congestion and rhinorrhea.   Eyes: Negative for redness and visual disturbance.  Respiratory: Positive for shortness of breath. Negative for wheezing.   Cardiovascular: Negative for chest pain and palpitations.  Gastrointestinal: Negative for nausea and vomiting.  Genitourinary: Negative for dysuria and urgency.  Musculoskeletal: Negative for arthralgias and myalgias.  Skin: Negative for pallor and wound.  Neurological: Negative for dizziness and headaches.    Physical Exam Updated Vital Signs BP 138/84   Pulse 78   Temp 98.6 F (37 C) (Oral)   Resp (!) 27   SpO2 97%   Physical Exam Vitals and nursing note reviewed.  Constitutional:      General: She is not in acute distress.    Appearance: She is well-developed. She is not diaphoretic.  HENT:     Head: Normocephalic and atraumatic.  Eyes:     Pupils:  Pupils are equal, round, and reactive to light.  Cardiovascular:     Rate and Rhythm: Normal rate and regular rhythm.     Heart sounds: No murmur heard.  No friction rub. No gallop.   Pulmonary:     Effort: Pulmonary effort is normal.     Breath sounds: Decreased breath sounds (in all fields) present. No wheezing or rales.  Abdominal:     General: There is no distension.     Palpations: Abdomen is soft.     Tenderness: There is no abdominal tenderness.  Musculoskeletal:        General: No tenderness.     Cervical back: Normal range of motion and neck supple.  Skin:    General: Skin is warm and dry.  Neurological:     Mental Status: She is alert and oriented to person, place, and time.  Psychiatric:        Behavior: Behavior normal.     ED Results / Procedures / Treatments   Labs (all labs ordered are listed, but only abnormal results are displayed) Labs Reviewed - No data to display  EKG EKG Interpretation  Date/Time:  Thursday November 10 2019 07:03:40 EDT Ventricular Rate:  101 PR Interval:    QRS Duration: 80 QT Interval:  355 QTC Calculation: 461 R Axis:   47 Text Interpretation: Sinus tachycardia Borderline ST depression, diffuse leads No significant change since last tracing Confirmed by Deno Etienne [phone]) on 11/10/2019 7:06:13 AM   Radiology DG Chest Port 1 View  Result Date: 11/10/2019 CLINICAL DATA:  Shortness of breath. EXAM: PORTABLE CHEST 1 VIEW COMPARISON:  10/20/2019 FINDINGS: The cardiac silhouette, mediastinal and hilar contours are within normal limits and stable. Stable mild tortuosity and calcification of the thoracic aorta. Streaky basilar scarring changes but no infiltrates, edema or effusions. The bony thorax is intact. IMPRESSION: Streaky basilar scarring changes. No acute overlying pulmonary process. Electronically Signed   By: Marijo Sanes M.D.   On: 11/10/2019 07:43    Procedures Procedures (including critical care time)  Medications  Ordered in ED Medications - No data to display  ED Course  I have reviewed the triage vital signs and the nursing notes.  Pertinent labs & imaging results that were available during my care of the patient were reviewed by me and considered in my medical decision making (see chart for details).    MDM Rules/Calculators/A&P  69 yo F with a cc of sob.  Started last night.  Was just in the hospital with HCAP.  She thought she was supposed to be sent home with O2, but doesn't have any at the house.  Patient feeling better on my exam.  Declines suctioning, feels she would be fine with O2 at home.  Will consult social work.   Home O2 is provided.  Patient continues to feel well.  Chest x-ray without significant change from prior.  No pneumonia seen on this chest x-ray.  Discharge home.  11:12 AM:  I have discussed the diagnosis/risks/treatment options with the patient and believe the pt to be eligible for discharge home to follow-up with PCP. We also discussed returning to the ED immediately if new or worsening sx occur. We discussed the sx which are most concerning (e.g., sudden worsening pain, fever, inability to tolerate by mouth) that necessitate immediate return. Medications administered to the patient during their visit and any new prescriptions provided to the patient are listed below.  Medications given during this visit Medications - No data to display   The patient appears reasonably screen and/or stabilized for discharge and I doubt any other medical condition or other [facility] requiring further screening, evaluation, or treatment in the ED at this time prior to discharge.  Final Clinical Impression(s) / ED Diagnoses Final diagnoses:  Hypoxia    Rx / DC Orders ED Discharge Orders         Ordered    For home use only DME oxygen        11/10/19 0831    For home use only DME oxygen        11/10/19 [place], [place], DO 11/10/19 1112

## 2019-11-10 NOTE — Discharge Instructions (Addendum)
Return for worsening shortness of breath, or if you become confused or develp chest pain.

## 2019-11-10 NOTE — ED Triage Notes (Signed)
Pt comes via [facility] from home, left last week, was supposed to be discharged with oxygen but has not received it, upon EMS arrival oxygen was in the 80's, pt has a chronic trach, pt has non rebreather on over trach

## 2019-11-10 NOTE — Discharge Planning (Signed)
Fuller Mandril, RN, BSN, [place] [phone] Pt qualifies for DME oxygen.  DME  ordered through [facility].  Freda Munro of [facility] notified to deliver oxygen to pt room prior to D/C home.

## 2019-11-11 ENCOUNTER — Inpatient Hospital Stay (HOSPITAL_COMMUNITY): Payer: Medicare Other

## 2019-11-11 ENCOUNTER — Other Ambulatory Visit: Payer: Self-pay

## 2019-11-11 ENCOUNTER — Inpatient Hospital Stay (HOSPITAL_COMMUNITY)
Admission: EM | Admit: 2019-11-11 | Discharge: 2019-11-21 | DRG: 208 | Disposition: A | Discharge status: Home Health | Payer: Medicare Other | Attending: Internal Medicine | Admitting: Internal Medicine

## 2019-11-11 ENCOUNTER — Encounter (HOSPITAL_COMMUNITY): Payer: Self-pay | Admitting: Emergency Medicine

## 2019-11-11 ENCOUNTER — Emergency Department (HOSPITAL_COMMUNITY): Payer: Medicare Other

## 2019-11-11 DIAGNOSIS — Z833 Family history of diabetes mellitus: Secondary | ICD-10-CM | POA: Diagnosis not present

## 2019-11-11 DIAGNOSIS — R0902 Hypoxemia: Secondary | ICD-10-CM | POA: Diagnosis not present

## 2019-11-11 DIAGNOSIS — J80 Acute respiratory distress syndrome: Secondary | ICD-10-CM | POA: Diagnosis not present

## 2019-11-11 DIAGNOSIS — I69351 Hemiplegia and hemiparesis following cerebral infarction affecting right dominant side: Secondary | ICD-10-CM | POA: Diagnosis not present

## 2019-11-11 DIAGNOSIS — Z79899 Other long term (current) drug therapy: Secondary | ICD-10-CM | POA: Diagnosis not present

## 2019-11-11 DIAGNOSIS — E7800 Pure hypercholesterolemia, unspecified: Secondary | ICD-10-CM | POA: Diagnosis present

## 2019-11-11 DIAGNOSIS — Z7901 Long term (current) use of anticoagulants: Secondary | ICD-10-CM

## 2019-11-11 DIAGNOSIS — J9600 Acute respiratory failure, unspecified whether with hypoxia or hypercapnia: Secondary | ICD-10-CM | POA: Diagnosis present

## 2019-11-11 DIAGNOSIS — Z87891 Personal history of nicotine dependence: Secondary | ICD-10-CM

## 2019-11-11 DIAGNOSIS — Z803 Family history of malignant neoplasm of breast: Secondary | ICD-10-CM | POA: Diagnosis not present

## 2019-11-11 DIAGNOSIS — R0602 Shortness of breath: Secondary | ICD-10-CM | POA: Diagnosis present

## 2019-11-11 DIAGNOSIS — C329 Malignant neoplasm of larynx, unspecified: Secondary | ICD-10-CM | POA: Diagnosis present

## 2019-11-11 DIAGNOSIS — I5030 Unspecified diastolic (congestive) heart failure: Secondary | ICD-10-CM

## 2019-11-11 DIAGNOSIS — R52 Pain, unspecified: Secondary | ICD-10-CM | POA: Diagnosis not present

## 2019-11-11 DIAGNOSIS — J90 Pleural effusion, not elsewhere classified: Secondary | ICD-10-CM | POA: Diagnosis not present

## 2019-11-11 DIAGNOSIS — R069 Unspecified abnormalities of breathing: Secondary | ICD-10-CM

## 2019-11-11 DIAGNOSIS — Z4682 Encounter for fitting and adjustment of non-vascular catheter: Secondary | ICD-10-CM | POA: Diagnosis not present

## 2019-11-11 DIAGNOSIS — J440 Chronic obstructive pulmonary disease with acute lower respiratory infection: Secondary | ICD-10-CM | POA: Diagnosis present

## 2019-11-11 DIAGNOSIS — Z930 Tracheostomy status: Secondary | ICD-10-CM | POA: Diagnosis not present

## 2019-11-11 DIAGNOSIS — N179 Acute kidney failure, unspecified: Secondary | ICD-10-CM | POA: Diagnosis present

## 2019-11-11 DIAGNOSIS — E669 Obesity, unspecified: Secondary | ICD-10-CM | POA: Diagnosis present

## 2019-11-11 DIAGNOSIS — J9811 Atelectasis: Secondary | ICD-10-CM | POA: Diagnosis not present

## 2019-11-11 DIAGNOSIS — J15211 Pneumonia due to Methicillin susceptible Staphylococcus aureus: Principal | ICD-10-CM | POA: Diagnosis present

## 2019-11-11 DIAGNOSIS — E785 Hyperlipidemia, unspecified: Secondary | ICD-10-CM | POA: Diagnosis present

## 2019-11-11 DIAGNOSIS — Z86718 Personal history of other venous thrombosis and embolism: Secondary | ICD-10-CM

## 2019-11-11 DIAGNOSIS — Z20822 Contact with and (suspected) exposure to covid-19: Secondary | ICD-10-CM | POA: Diagnosis present

## 2019-11-11 DIAGNOSIS — Z90710 Acquired absence of both cervix and uterus: Secondary | ICD-10-CM

## 2019-11-11 DIAGNOSIS — E43 Unspecified severe protein-calorie malnutrition: Secondary | ICD-10-CM | POA: Diagnosis present

## 2019-11-11 DIAGNOSIS — Z86711 Personal history of pulmonary embolism: Secondary | ICD-10-CM | POA: Diagnosis not present

## 2019-11-11 DIAGNOSIS — R0603 Acute respiratory distress: Secondary | ICD-10-CM | POA: Diagnosis not present

## 2019-11-11 DIAGNOSIS — J9622 Acute and chronic respiratory failure with hypercapnia: Secondary | ICD-10-CM | POA: Diagnosis not present

## 2019-11-11 DIAGNOSIS — Z8521 Personal history of malignant neoplasm of larynx: Secondary | ICD-10-CM

## 2019-11-11 DIAGNOSIS — D649 Anemia, unspecified: Secondary | ICD-10-CM | POA: Diagnosis present

## 2019-11-11 DIAGNOSIS — J9621 Acute and chronic respiratory failure with hypoxia: Secondary | ICD-10-CM | POA: Diagnosis present

## 2019-11-11 DIAGNOSIS — R404 Transient alteration of awareness: Secondary | ICD-10-CM | POA: Diagnosis not present

## 2019-11-11 DIAGNOSIS — F419 Anxiety disorder, unspecified: Secondary | ICD-10-CM | POA: Diagnosis present

## 2019-11-11 DIAGNOSIS — I110 Hypertensive heart disease with heart failure: Secondary | ICD-10-CM | POA: Diagnosis present

## 2019-11-11 DIAGNOSIS — R918 Other nonspecific abnormal finding of lung field: Secondary | ICD-10-CM | POA: Diagnosis not present

## 2019-11-11 DIAGNOSIS — I10 Essential (primary) hypertension: Secondary | ICD-10-CM | POA: Diagnosis not present

## 2019-11-11 DIAGNOSIS — J9690 Respiratory failure, unspecified, unspecified whether with hypoxia or hypercapnia: Secondary | ICD-10-CM | POA: Diagnosis not present

## 2019-11-11 LAB — CBC WITH DIFFERENTIAL/PLATELET
Abs Immature Granulocytes: 0.15 10*3/uL — ABNORMAL HIGH (ref 0.00–0.07)
Basophils Absolute: 0 10*3/uL (ref 0.0–0.1)
Basophils Relative: 0 %
Eosinophils Absolute: 0.1 10*3/uL (ref 0.0–0.5)
Eosinophils Relative: 1 %
HCT: 38.9 % (ref 36.0–46.0)
Hemoglobin: 11.2 g/dL — ABNORMAL LOW (ref 12.0–15.0)
Immature Granulocytes: 1 %
Lymphocytes Relative: 48 %
Lymphs Abs: 5 10*3/uL — ABNORMAL HIGH (ref 0.7–4.0)
MCH: 29.2 pg (ref 26.0–34.0)
MCHC: 28.8 g/dL — ABNORMAL LOW (ref 30.0–36.0)
MCV: 101.3 fL — ABNORMAL HIGH (ref 80.0–100.0)
Monocytes Absolute: 0.7 10*3/uL (ref 0.1–1.0)
Monocytes Relative: 7 %
Neutro Abs: 4.4 10*3/uL (ref 1.7–7.7)
Neutrophils Relative %: 43 %
Platelets: 191 10*3/uL (ref 150–400)
RBC: 3.84 MIL/uL — ABNORMAL LOW (ref 3.87–5.11)
RDW: 13.3 % (ref 11.5–15.5)
WBC: 10.4 10*3/uL (ref 4.0–10.5)
nRBC: 0 % (ref 0.0–0.2)

## 2019-11-11 LAB — COMPREHENSIVE METABOLIC PANEL
ALT: 26 U/L (ref 0–44)
AST: 32 U/L (ref 15–41)
Albumin: 3.4 g/dL — ABNORMAL LOW (ref 3.5–5.0)
Alkaline Phosphatase: 97 U/L (ref 38–126)
Anion gap: 12 (ref 5–15)
BUN: 13 mg/dL (ref 8–23)
CO2: 28 mmol/L (ref 22–32)
Calcium: 9 mg/dL (ref 8.9–10.3)
Chloride: 104 mmol/L (ref 98–111)
Creatinine, Ser: 1.28 mg/dL — ABNORMAL HIGH (ref 0.44–1.00)
GFR calc Af Amer: 49 mL/min — ABNORMAL LOW (ref >60)
GFR calc non Af Amer: 42 mL/min — ABNORMAL LOW (ref >60)
Glucose, Bld: 169 mg/dL — ABNORMAL HIGH (ref 70–99)
Potassium: 3.4 mmol/L — ABNORMAL LOW (ref 3.5–5.1)
Sodium: 144 mmol/L (ref 135–145)
Total Bilirubin: 0.8 mg/dL (ref 0.3–1.2)
Total Protein: 6.9 g/dL (ref 6.5–8.1)

## 2019-11-11 LAB — I-STAT ARTERIAL BLOOD GAS, ED
Acid-Base Excess: 3 mmol/L — ABNORMAL HIGH (ref 0.0–2.0)
Bicarbonate: 30.4 mmol/L — ABNORMAL HIGH (ref 20.0–28.0)
Calcium, Ion: 1.22 mmol/L (ref 1.15–1.40)
HCT: 34 % — ABNORMAL LOW (ref 36.0–46.0)
Hemoglobin: 11.6 g/dL — ABNORMAL LOW (ref 12.0–15.0)
O2 Saturation: 81 %
Patient temperature: 98.7
Potassium: 3.4 mmol/L — ABNORMAL LOW (ref 3.5–5.1)
Sodium: 145 mmol/L (ref 135–145)
TCO2: 32 mmol/L (ref 22–32)
pCO2 arterial: 60.1 mmHg — ABNORMAL HIGH (ref 32.0–48.0)
pH, Arterial: 7.312 — ABNORMAL LOW (ref 7.350–7.450)
pO2, Arterial: 51 mmHg — ABNORMAL LOW (ref 83.0–108.0)

## 2019-11-11 LAB — PROTIME-INR
INR: 1.2 (ref 0.8–1.2)
Prothrombin Time: 15 seconds (ref 11.4–15.2)

## 2019-11-11 LAB — SARS CORONAVIRUS 2 BY RT PCR (HOSPITAL ORDER, PERFORMED IN ~~LOC~~ HOSPITAL LAB): SARS Coronavirus 2: NEGATIVE

## 2019-11-11 LAB — TROPONIN I (HIGH SENSITIVITY)
Troponin I (High Sensitivity): 34 ng/L — ABNORMAL HIGH (ref <18)
Troponin I (High Sensitivity): 40 ng/L — ABNORMAL HIGH (ref <18)

## 2019-11-11 LAB — ECHOCARDIOGRAM COMPLETE
Area-P 1/2: 2.66 cm2
Height: 59 in
P 1/2 time: 578 msec
S' Lateral: 1.8 cm

## 2019-11-11 LAB — BRAIN NATRIURETIC PEPTIDE: B Natriuretic Peptide: 301.9 pg/mL — ABNORMAL HIGH (ref 0.0–100.0)

## 2019-11-11 LAB — LACTIC ACID, PLASMA
Lactic Acid, Venous: 3.5 mmol/L (ref 0.5–1.9)
Lactic Acid, Venous: 2 mmol/L (ref 0.5–1.9)

## 2019-11-11 LAB — GLUCOSE, CAPILLARY
Glucose-Capillary: 84 mg/dL (ref 70–99)
Glucose-Capillary: 134 mg/dL — ABNORMAL HIGH (ref 70–99)
Glucose-Capillary: 129 mg/dL — ABNORMAL HIGH (ref 70–99)

## 2019-11-11 LAB — PROCALCITONIN: Procalcitonin: 0.8 ng/mL

## 2019-11-11 LAB — MRSA PCR SCREENING: MRSA by PCR: NEGATIVE

## 2019-11-11 MED ORDER — APIXABAN 5 MG PO TABS
5.0000 mg | ORAL_TABLET | Freq: Two times a day (BID) | ORAL | Status: DC
  Filled 2019-11-11: qty 1

## 2019-11-11 MED ORDER — LACTATED RINGERS IV SOLN: INTRAVENOUS | Status: AC

## 2019-11-11 MED ORDER — VANCOMYCIN HCL IN DEXTROSE 1-5 GM/200ML-% IV SOLN: 1000.0000 mg | Freq: Once | INTRAVENOUS | Status: DC

## 2019-11-11 MED ORDER — CHLORHEXIDINE GLUCONATE 0.12% ORAL RINSE (MEDLINE KIT)
15.0000 mL | Freq: Two times a day (BID) | OROMUCOSAL | Status: DC
  Administered 2019-11-11 – 2019-11-13 (×5): 15 mL via OROMUCOSAL

## 2019-11-11 MED ORDER — POLYETHYLENE GLYCOL 3350 17 G PO PACK
17.0000 g | PACK | Freq: Every day | ORAL | Status: DC | PRN
  Administered 2019-11-18: 17 g via ORAL

## 2019-11-11 MED ORDER — LACTATED RINGERS IV BOLUS (SEPSIS)
1000.0000 mL | Freq: Once | INTRAVENOUS | Status: AC
  Administered 2019-11-11: 1000 mL via INTRAVENOUS

## 2019-11-11 MED ORDER — DOCUSATE SODIUM 100 MG PO CAPS: 100.0000 mg | ORAL_CAPSULE | Freq: Two times a day (BID) | ORAL | Status: DC | PRN

## 2019-11-11 MED ORDER — DEXTROSE IN LACTATED RINGERS 5 % IV SOLN: INTRAVENOUS | Status: DC

## 2019-11-11 MED ORDER — VANCOMYCIN VARIABLE DOSE PER UNSTABLE RENAL FUNCTION (PHARMACIST DOSING): Status: DC

## 2019-11-11 MED ORDER — VANCOMYCIN HCL 1250 MG/250ML IV SOLN
1250.0000 mg | Freq: Once | INTRAVENOUS | Status: AC
  Administered 2019-11-11: 1250 mg via INTRAVENOUS
  Filled 2019-11-11: qty 250

## 2019-11-11 MED ORDER — ORAL CARE MOUTH RINSE
15.0000 mL | OROMUCOSAL | Status: DC
  Administered 2019-11-11 – 2019-11-13 (×20): 15 mL via OROMUCOSAL

## 2019-11-11 MED ORDER — ATORVASTATIN CALCIUM 80 MG PO TABS
80.0000 mg | ORAL_TABLET | Freq: Every day | ORAL | Status: DC
  Administered 2019-11-12 – 2019-11-20 (×9): 80 mg via ORAL
  Filled 2019-11-11 (×2): qty 1
  Filled 2019-11-11: qty 2
  Filled 2019-11-11 (×3): qty 1
  Filled 2019-11-11: qty 2
  Filled 2019-11-11 (×2): qty 1

## 2019-11-11 MED ORDER — ONDANSETRON HCL 4 MG/2ML IJ SOLN: 4.0000 mg | Freq: Four times a day (QID) | INTRAMUSCULAR | Status: DC | PRN

## 2019-11-11 MED ORDER — METHYLPREDNISOLONE SODIUM SUCC 125 MG IJ SOLR
INTRAMUSCULAR | Status: AC
  Administered 2019-11-11: 60 mg via INTRAVENOUS
  Filled 2019-11-11: qty 2

## 2019-11-11 MED ORDER — ENOXAPARIN SODIUM 60 MG/0.6ML ~~LOC~~ SOLN
60.0000 mg | Freq: Two times a day (BID) | SUBCUTANEOUS | Status: DC
  Administered 2019-11-11 – 2019-11-17 (×11): 60 mg via SUBCUTANEOUS
  Filled 2019-11-11 (×13): qty 0.6

## 2019-11-11 MED ORDER — DEXMEDETOMIDINE HCL IN NACL 400 MCG/100ML IV SOLN
0.4000 ug/kg/h | INTRAVENOUS | Status: DC
  Administered 2019-11-11: 0.4 ug/kg/h via INTRAVENOUS
  Filled 2019-11-11: qty 100

## 2019-11-11 MED ORDER — MIDAZOLAM HCL 2 MG/2ML IJ SOLN: 1.0000 mg | Freq: Once | INTRAMUSCULAR | Status: DC

## 2019-11-11 MED ORDER — SODIUM CHLORIDE 0.9 % IV SOLN
2.0000 g | Freq: Once | INTRAVENOUS | Status: AC
  Administered 2019-11-11: 2 g via INTRAVENOUS
  Filled 2019-11-11: qty 2

## 2019-11-11 MED ORDER — METHYLPREDNISOLONE SODIUM SUCC 125 MG IJ SOLR: 60.0000 mg | Freq: Once | INTRAMUSCULAR | Status: AC

## 2019-11-11 MED ORDER — MIDAZOLAM HCL 2 MG/2ML IJ SOLN: 2.0000 mg | Freq: Once | INTRAMUSCULAR | Status: AC

## 2019-11-11 MED ORDER — PANTOPRAZOLE SODIUM 40 MG IV SOLR
40.0000 mg | Freq: Every day | INTRAVENOUS | Status: DC
  Administered 2019-11-11 – 2019-11-14 (×4): 40 mg via INTRAVENOUS
  Filled 2019-11-11 (×4): qty 40

## 2019-11-11 MED ORDER — SODIUM CHLORIDE 0.9 % IV SOLN
2.0000 g | Freq: Two times a day (BID) | INTRAVENOUS | Status: DC
  Administered 2019-11-11 – 2019-11-14 (×6): 2 g via INTRAVENOUS
  Filled 2019-11-11 (×6): qty 2

## 2019-11-11 MED ORDER — CHLORHEXIDINE GLUCONATE CLOTH 2 % EX PADS
6.0000 | MEDICATED_PAD | Freq: Every day | CUTANEOUS | Status: DC
  Administered 2019-11-12 – 2019-11-21 (×9): 6 via TOPICAL

## 2019-11-11 MED ORDER — METOPROLOL TARTRATE 5 MG/5ML IV SOLN
5.0000 mg | Freq: Four times a day (QID) | INTRAVENOUS | Status: DC | PRN
  Administered 2019-11-11 – 2019-11-14 (×3): 5 mg via INTRAVENOUS
  Filled 2019-11-11 (×3): qty 5

## 2019-11-11 MED ORDER — MIDAZOLAM HCL 2 MG/2ML IJ SOLN
INTRAMUSCULAR | Status: AC
  Administered 2019-11-11: 2 mg via INTRAVENOUS
  Filled 2019-11-11: qty 2

## 2019-11-11 NOTE — Progress Notes (Signed)
NAME:  Katelyn [name], MRN:  [id number], DOB:  [birth year], LOS: 0 ADMISSION DATE:  11/11/2019,  CHIEF COMPLAINT: Short of breath  Brief History   69 year old female that presented with shortness of breath required intubation via previous stoma.  History of present illness   This is a 69 year old black female that presented to the emergency room from home.  She has an extensive medical history.  In 2015, she was found to have a laryngeal mass but did not follow-up on that until 2019.  At that time she was found to have stage IV laryngeal cancer.  She was taken to the operating room and had resection of the tumor and a tracheostomy placed at that time.  Since then she has been decannulated but the stoma has remained in place.  She was recently hospitalized from 12 August to 24 August and then from 26 August-31 August.  She was given a course of broad-spectrum antibiotics for right lower lobe infiltrate.  There is also a new concern for COPD and was put on Solu-Medrol at that time.  She been discharged home and on 16 September she presented back to the emergency room complaining of diarrhea and shortness of breath.  She been discharged to home with home oxygen.  Today she presented back to the emergency room with increasing shortness of breath, and productive cough.  Patient was found to be significantly hypoxic on arrival by EMS.  Antimicrobials:  Vancomycin Cefepime  Objective   Blood pressure (!) 153/77, pulse (!) 45, temperature (!) 96.1 F (35.6 C), temperature source Tympanic, resp. rate 16, height 4\' 11"  (1.499 m), SpO2 100 %.    Vent Mode: PRVC FiO2 (%):  [60 %-100 %] 100 % Set Rate:  [16 bmp] 16 bmp Vt Set:  [340 mL] 340 mL PEEP:  [5 cmH20] 5 cmH20 Plateau Pressure:  [15 cmH20-30 cmH20] 16 cmH20   Intake/Output Summary (Last 24 hours) at 11/11/2019 1614 Last data filed at 11/11/2019 1500 Gross per 24 hour  Intake 751.81 ml  Output --  Net 751.81 ml   There were no vitals filed  for this visit.  Examination: General: In no acute distress, On vent HENT: Size 6 tracheostomy tube in place Lungs: Decreased breath sounds bilaterally. Cardiovascular: S1-S2 appreciated with no murmur Abdomen: Bowel sounds appreciated Extremities: No edema, no clubbing Neuro: Patient's awake and alert.  She clearly mouths words and interacts with her surroundings.  Following commands.  Generally diffusely weak.   Assessment & Plan:  Acute hypoxic respiratory failure -Continue support  Tracheobronchitis Possible pneumonia  -Continue antibiotics   HFpEF Stage IV laryngeal cancer -Has a chronic stoma -has a six shiley tube in place  Hypertension Chronic anemia  Trach tube exchange was performed today Continue broad-spectrum antibiotics Follow cultures Follow echocardiogram Continue anticoagulation   Best practice:  Diet: N.p.o. Pain/Anxiety/Delirium protocol (if indicated): Precedex VAP protocol (if indicated): Initiated DVT prophylaxis: Patient's on Eliquis and will continue. GI prophylaxis: Protonix Glucose control:  Mobility: Bedrest Code Status: Full Family Communication: Updated daughter at bedside. Disposition: Admit to the intensive care unit  Labs   CBC: Recent Labs  Lab 11/11/19 0154 11/11/19 0247  WBC 10.4  --   NEUTROABS 4.4  --   HGB 11.2* 11.6*  HCT 38.9 34.0*  MCV 101.3*  --   PLT 191  --     Basic Metabolic Panel: Recent Labs  Lab 11/11/19 0154 11/11/19 0247  NA 144 145  K 3.4* 3.4*  CL 104  --  CO2 28  --   GLUCOSE 169*  --   BUN 13  --   CREATININE 1.28*  --   CALCIUM 9.0  --    GFR: Estimated Creatinine Clearance: 32.6 mL/min (A) (by C-G formula based on SCr of 1.28 mg/dL (H)). Recent Labs  Lab 11/11/19 0154 11/11/19 0500 11/11/19 0947  PROCALCITON  --   --  0.80  WBC 10.4  --   --   LATICACIDVEN 3.5* 2.0*  --     Liver Function Tests: Recent Labs  Lab 11/11/19 0154  AST 32  ALT 26  ALKPHOS 97  BILITOT 0.8    PROT 6.9  ALBUMIN 3.4*   No results for input(s): LIPASE, AMYLASE in the last 168 hours. No results for input(s): AMMONIA in the last 168 hours.  ABG    Component Value Date/Time   PHART 7.312 (L) 11/11/2019 0247   PCO2ART 60.1 (H) 11/11/2019 0247   PO2ART 51 (L) 11/11/2019 0247   HCO3 30.4 (H) 11/11/2019 0247   TCO2 32 11/11/2019 0247   ACIDBASEDEF 5.8 (H) 10/06/2019 1438   O2SAT 81.0 11/11/2019 0247     Coagulation Profile: Recent Labs  Lab 11/11/19 0154  INR 1.2    Cardiac Enzymes: No results for input(s): CKTOTAL, CKMB, CKMBINDEX, TROPONINI in the last 168 hours.  HbA1C: Hgb A1c MFr Bld  Date/Time Value Ref Range Status  10/07/2019 04:13 AM 5.4 4.8 - 5.6 % Final    Comment:    (NOTE) Pre diabetes:          5.7%-6.4%  Diabetes:              >6.4%  Glycemic control for   <7.0% adults with diabetes   03/02/2011 06:02 AM 5.6 <5.7 % Final    Comment:    (NOTE)                                                                       According to the ADA Clinical Practice Recommendations for 2011, when HbA1c is used as a screening test:  >=6.5%   Diagnostic of Diabetes Mellitus           (if abnormal result is confirmed) 5.7-6.4%   Increased risk of developing Diabetes Mellitus References:Diagnosis and Classification of Diabetes Mellitus,Diabetes ATFT,7322,02(RKYHC 1):S62-S69 and Standards of Medical Care in         Diabetes - 2011,Diabetes WCBJ,6283,15 (Suppl 1):S11-S61.    CBG: Recent Labs  Lab 11/11/19 0922  GLUCAP 84    Review of Systems:   Unable to be completed  Past Medical History  She,  has a past medical history of Acute gallstone pancreatitis (01/08/2014), AKI (acute kidney injury) ([place]) (01/08/2014), Anemia, Blood transfusion, Cancer ([place]), Cholecystitis, H/O tracheostomy, Hip fracture ([place]), Hypercholesteremia, Hypertension, Right leg DVT ([place]) (05/08/2014), Septic shock - Kelbsiella (01/09/2014), Shortness of breath, and Stroke ([place]).    Surgical History    Past Surgical History:  Procedure Laterality Date  . ABDOMINAL HYSTERECTOMY    . DIRECT LARYNGOSCOPY N/A 06/08/2017   Procedure: DIRECT LARYNGOSCOPY WITH BIOPSY;  Surgeon: Helayne Seminole, MD;  Location: [place];  Service: ENT;  Laterality: N/A;  . ENDOSCOPIC RETROGRADE CHOLANGIOPANCREATOGRAPHY (ERCP) WITH PROPOFOL N/A 01/11/2014   Procedure: ENDOSCOPIC RETROGRADE  CHOLANGIOPANCREATOGRAPHY (ERCP) WITH PROPOFOL;  Surgeon: Gatha Mayer, MD;  Location: [place];  Service: Endoscopy;  Laterality: N/A;  . ERCP    . ERCP N/A 05/26/2014   Procedure: ENDOSCOPIC RETROGRADE CHOLANGIOPANCREATOGRAPHY (ERCP);  Surgeon: Gatha Mayer, MD;  Location[facility];  Service: Endoscopy;  Laterality: N/A;  . ESOPHAGOGASTRODUODENOSCOPY (EGD) WITH PROPOFOL N/A 12/07/2014   Procedure: ESOPHAGOGASTRODUODENOSCOPY (EGD) WITH PROPOFOL;  Surgeon: Milus Banister, MD;  [facility];  Service: Endoscopy;  Laterality: N/A;  . EUS N/A 12/07/2014   Procedure: UPPER ENDOSCOPIC ULTRASOUND (EUS) LINEAR;  Surgeon: Milus Banister, MD;  [facility];  Service: Endoscopy;  Laterality: N/A;  . GASTROINTESTINAL STENT REMOVAL N/A 12/07/2014   Procedure: GASTROINTESTINAL STENT REMOVAL;  Surgeon: Milus Banister, MD;  [facility];  Service: Endoscopy;  Laterality: N/A;  pancreatic stent removal  . HIP FRACTURE SURGERY    . INTRAMEDULLARY (IM) NAIL INTERTROCHANTERIC Right 05/29/2017   Procedure: INTRAMEDULLARY (IM) NAIL INTERTROCHANTRIC FEMUR;  Surgeon: Nicholes Stairs, MD;  Location: [place];  Service: Orthopedics;  Laterality: Right;  . PERCUTANEOUS TRACHEOSTOMY N/A 05/29/2017   Procedure: PERCUTANEOUS TRACHEOSTOMY;  Surgeon: Judeth Horn, MD;  Location: [place];  Service: General;  Laterality: N/A;  . TEE WITHOUT CARDIOVERSION  03/06/2011   Procedure: TRANSESOPHAGEAL ECHOCARDIOGRAM (TEE);  Surgeon: Jolaine Artist, MD;  Location: [facility] ENDOSCOPY;  Service: Cardiovascular;   Laterality: N/A;     Social History   reports that she quit smoking about 2 years ago. Her smoking use included cigarettes. She smoked 0.25 packs per day. She has never used smokeless tobacco. She reports that she does not drink alcohol and does not use drugs.   Family History   Her family history includes Breast cancer in her maternal aunt; Diabetes Mellitus I in her father and sister. There is no history of Anesthesia problems, Hypotension, Malignant hyperthermia, Pseudochol deficiency, Colon cancer, Esophageal cancer, Pancreatic cancer, Kidney disease, or Liver disease.   Allergies No Known Allergies    The patient is critically ill with multiple organ systems failure and requires high complexity decision making for assessment and support, frequent evaluation and titration of therapies, application of advanced monitoring technologies and extensive interpretation of multiple databases. Critical Care Time devoted to patient care services described in this note independent of APP/resident time (if applicable)  is 30 minutes.   Sherrilyn Rist MD [facility] Personal pager: [phone] If unanswered, please page CCM On-call: [phone]

## 2019-11-11 NOTE — Progress Notes (Signed)
Pharmacy Antibiotic Note  Marcille Barman is a 69 y.o. female admitted on 11/11/2019 with pneumonia.  Pharmacy has been consulted for vancomycin dosing.  SCr elevated from baseline though this has been the trend on recent admissions.  Plan: Vancomycin 1250mg  given in ED; will monitor SCr trend prior to redosing.  Goal trough 15-20 mcg/mL. Cefepime also ordered appropriately by MD.  Height: 4\' 11"  (149.9 cm) IBW/kg (Calculated) : 43.2  Temp (24hrs), Avg:97.8 F (36.6 C), Min:96.1 F (35.6 C), Max:98.7 F (37.1 C)  Recent Labs  Lab 11/11/19 0154  WBC 10.4  CREATININE 1.28*  LATICACIDVEN 3.5*    Estimated Creatinine Clearance: 32.6 mL/min (A) (by C-G formula based on SCr of 1.28 mg/dL (H)).    No Known Allergies   Thank you for allowing pharmacy to be a part of this patients care.  Wynona Neat, PharmD, BCPS  11/11/2019 7:03 AM

## 2019-11-11 NOTE — Progress Notes (Signed)
Echo attempted at 9:40, patient in distress, will re-attempt after meds are given.  [place]

## 2019-11-11 NOTE — ED Notes (Signed)
Date and time results received: 11/11/19 0414 (use smartphrase ".now" to insert current time)  Test: lactic acid Critical Value: 3.5  Name of Provider Notified: [place], C  Orders Received? Or Actions Taken?: provider made aware

## 2019-11-11 NOTE — ED Notes (Signed)
Intensitivist at bedside.

## 2019-11-11 NOTE — ED Notes (Signed)
Pt placed on ventilator by RT per MD order

## 2019-11-11 NOTE — ED Provider Notes (Signed)
[facility] [place] EMERGENCY DEPARTMENT Provider Note   CSN: [id number] Arrival date & time: 11/11/19  0126     History Chief Complaint  Patient presents with  . Shortness of Breath    Katelyn [name] is a 69 y.o. female.  Patient brought to the emergency department from home by ambulance.  Patient reportedly woke her husband stating that she was acutely short of breath.  EMS report that the patient's oxygen saturations were 30% upon their arrival.  Patient with significant thick secretions from her tracheostomy stoma.  EMS report that she was on a trach mask upon their arrival.  Patient started on nonrebreather with no significant improvement of her oxygen saturations.  A 6-0 endotracheal tube was passed through her stoma and cuff was inflated.  Patient has been bagged via the endotracheal tube for remainder of transport, oxygen saturations are 90 to 95%.        Past Medical History:  Diagnosis Date  . Acute gallstone pancreatitis 01/08/2014  . AKI (acute kidney injury) ([place]) 01/08/2014  . Anemia   . Blood transfusion    11'15 last admission  . Cancer ([place])   . Cholecystitis   . H/O tracheostomy   . Hip fracture ([place])   . Hypercholesteremia   . Hypertension   . Right leg DVT ([place]) 05/08/2014  . Septic shock - Kelbsiella 01/09/2014  . Shortness of breath   . Stroke [facility])    '13- right side weakness, ambulates with cane    Patient Active Problem List   Diagnosis Date Noted  . Loose stools 11/06/2019  . Benign essential HTN   . Chronic obstructive pulmonary disease ([place])   . Debility 10/25/2019  . Acute hypoxemic respiratory failure ([place]) 10/21/2019  . Goals of care, counseling/discussion   . Palliative care by specialist   . History of noncompliance with medical treatment   . COPD exacerbation ([place]) 10/06/2019  . Generalized weakness 07/12/2017  . Palliative care encounter 07/12/2017  . 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 Pressure injury of  skin 05/30/2017  . Malnutrition of moderate degree 05/30/2017  . Hip fx, right, closed, initial encounter ([place]) 05/29/2017  . Closed displaced intertrochanteric fracture of right femur ([place]) 05/29/2017  . Dysphagia 05/29/2017  . Weight loss 05/29/2017  . Difficult airway for intubation 05/29/2017  . Lesion of epiglottis 05/26/2014  . Abnormal CT scan, pancreas or bile duct   . Right leg DVT ([place]) 05/08/2014  . Anemia 01/09/2014  . Hip pain, right 05/06/2011  . 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 CKD (chronic kidney disease) stage 3, GFR 30-59 ml/min (HCC) 03/04/2011  . Diastolic dysfunction 15/40/0867  . Tobacco abuse 03/04/2011  . Constipation 03/04/2011  . Malignant hypertensive urgency 03/01/2011  . Cerebral infarction ([place]) 03/01/2011  . NEUROPATHY, IDIOPATHIC 10/25/2007  . Hypercholesteremia 12/15/2006  . Essential hypertension 12/15/2006    Past Surgical History:  Procedure Laterality Date  . ABDOMINAL HYSTERECTOMY    . DIRECT LARYNGOSCOPY N/A 06/08/2017   Procedure: DIRECT LARYNGOSCOPY WITH BIOPSY;  Surgeon: Helayne Seminole, MD;  Location[facility] OR;  Service: ENT;  Laterality: N/A;  . ENDOSCOPIC RETROGRADE CHOLANGIOPANCREATOGRAPHY (ERCP) WITH PROPOFOL N/A 01/11/2014   Procedure: ENDOSCOPIC RETROGRADE CHOLANGIOPANCREATOGRAPHY (ERCP) WITH PROPOFOL;  Surgeon: Gatha Mayer, MD;  Location: [place];  Service: Endoscopy;  Laterality: N/A;  . ERCP    . ERCP N/A 05/26/2014   Procedure: ENDOSCOPIC RETROGRADE CHOLANGIOPANCREATOGRAPHY (ERCP);  Surgeon: Gatha Mayer, MD;  Location: [facility];  Service: Endoscopy;  Laterality: N/A;  . ESOPHAGOGASTRODUODENOSCOPY (EGD) WITH PROPOFOL N/A 12/07/2014   Procedure: ESOPHAGOGASTRODUODENOSCOPY (EGD) WITH PROPOFOL;  Surgeon: Milus Banister, MD;  Location: [facility] ENDOSCOPY;  Service: Endoscopy;  Laterality: N/A;  . EUS N/A 12/07/2014   Procedure: UPPER ENDOSCOPIC ULTRASOUND (EUS) LINEAR;  Surgeon: Milus Banister, MD;   Location: [facility] ENDOSCOPY;  Service: Endoscopy;  Laterality: N/A;  . GASTROINTESTINAL STENT REMOVAL N/A 12/07/2014   Procedure: GASTROINTESTINAL STENT REMOVAL;  Surgeon: Milus Banister, MD;  Location: [facility] ENDOSCOPY;  Service: Endoscopy;  Laterality: N/A;  pancreatic stent removal  . HIP FRACTURE SURGERY    . INTRAMEDULLARY (IM) NAIL INTERTROCHANTERIC Right 05/29/2017   Procedure: INTRAMEDULLARY (IM) NAIL INTERTROCHANTRIC FEMUR;  Surgeon: Nicholes Stairs, MD;  Location: [place];  Service: Orthopedics;  Laterality: Right;  . PERCUTANEOUS TRACHEOSTOMY N/A 05/29/2017   Procedure: PERCUTANEOUS TRACHEOSTOMY;  Surgeon: Judeth Horn, MD;  Location: [place];  Service: General;  Laterality: N/A;  . TEE WITHOUT CARDIOVERSION  03/06/2011   Procedure: TRANSESOPHAGEAL ECHOCARDIOGRAM (TEE);  Surgeon: Jolaine Artist, MD;  Location: [facility] ENDOSCOPY;  Service: Cardiovascular;  Laterality: N/A;     OB History   No obstetric history on file.     Family History  Problem Relation Age of Onset  . Breast cancer Maternal Aunt   . Diabetes Mellitus I Father   . Diabetes Mellitus I Sister   . Anesthesia problems Neg Hx   . Hypotension Neg Hx   . Malignant hyperthermia Neg Hx   . Pseudochol deficiency Neg Hx   . Colon cancer Neg Hx   . Esophageal cancer Neg Hx   . Pancreatic cancer Neg Hx   . Kidney disease Neg Hx   . Liver disease Neg Hx     Social History   Tobacco Use  . Smoking status: Former Smoker    Packs/day: 0.25    Types: Cigarettes    Quit date: 05/29/2017    Years since quitting: 2.4  . Smokeless tobacco: Never Used  Substance Use Topics  . Alcohol use: No    Alcohol/week: 0.0 standard drinks  . Drug use: No    Home Medications Prior to Admission medications   Medication Sig Start Date End Date Taking? Authorizing Provider  ALPRAZolam Duanne Moron) 0.5 MG tablet Take 1 tablet (0.5 mg total) by mouth 3 (three) times daily as needed for anxiety. 10/18/19   Swayze, Ava, DO  apixaban (ELIQUIS) 5 MG  TABS tablet Take 1 tablet (5 mg total) by mouth 2 (two) times daily. 11/04/19   Love, Ivan Anchors, PA-C  atorvastatin (LIPITOR) 80 MG tablet Take 1 tablet (80 mg total) by mouth daily. 10/18/19   Swayze, Ava, DO  guaiFENesin-dextromethorphan (ROBITUSSIN DM) 100-10 MG/5ML syrup Take 10 mLs by mouth every 6 (six) hours. 10/18/19   Swayze, Ava, DO  melatonin 3 MG TABS tablet Take 1 tablet (3 mg total) by mouth at bedtime as needed (insomnia). 11/04/19   Love, Ivan Anchors, PA-C  metoprolol tartrate (LOPRESSOR) 25 MG tablet Take 1 tablet (25 mg total) by mouth 2 (two) times daily. 11/04/19   Love, Ivan Anchors, PA-C  nystatin cream (MYCOSTATIN) Apply 1 application topically 2 (two) times daily. 11/04/19   Love, Ivan Anchors, PA-C  polycarbophil (FIBERCON) 625 MG tablet Take 1 tablet (625 mg total) by mouth daily. 11/05/19   Bary Leriche, PA-C    Allergies    Patient has no known allergies.  Review of Systems   Review of Systems  Respiratory: Positive for shortness  of breath.   All other systems reviewed and are negative.   Physical Exam Updated Vital Signs BP 91/74   Pulse (!) 118   Temp (!) 96.1 F (35.6 C) (Tympanic)   Resp 18   Ht 4\' 11"  (1.499 m)   SpO2 100%   BMI 27.34 kg/m   Physical Exam Vitals and nursing note reviewed.  Constitutional:      General: She is not in acute distress.    Appearance: Normal appearance. She is well-developed.     Interventions: She is intubated.  HENT:     Head: Normocephalic and atraumatic.     Right Ear: Hearing normal.     Left Ear: Hearing normal.     Nose: Nose normal.  Eyes:     Conjunctiva/sclera: Conjunctivae normal.     Pupils: Pupils are equal, round, and reactive to light.  Cardiovascular:     Rate and Rhythm: Regular rhythm.     Heart sounds: S1 normal and S2 normal. No murmur heard.  No friction rub. No gallop.   Pulmonary:     Effort: Pulmonary effort is normal. Tachypnea present. No respiratory distress. She is intubated.     Breath sounds:  Decreased breath sounds (Absent on the left) and rales present.  Chest:     Chest wall: No tenderness.  Abdominal:     General: Bowel sounds are normal.     Palpations: Abdomen is soft.     Tenderness: There is no abdominal tenderness. There is no guarding or rebound. Negative signs include Murphy's sign and McBurney's sign.     Hernia: No hernia is present.  Musculoskeletal:        General: Normal range of motion.     Cervical back: Normal range of motion and neck supple.  Skin:    General: Skin is warm and dry.     Findings: No rash.  Neurological:     Mental Status: She is alert.     GCS: GCS eye subscore is 4. GCS verbal subscore is 5. GCS motor subscore is 6.     Cranial Nerves: No cranial nerve deficit.     Sensory: No sensory deficit.     Coordination: Coordination normal.  Psychiatric:        Speech: Speech normal.        Behavior: Behavior normal.        Thought Content: Thought content normal.     ED Results / Procedures / Treatments   Labs (all labs ordered are listed, but only abnormal results are displayed) Labs Reviewed  COMPREHENSIVE METABOLIC PANEL - Abnormal; Notable for the following components:      Result Value   Potassium 3.4 (*)    Glucose, Bld 169 (*)    Creatinine, Ser 1.28 (*)    Albumin 3.4 (*)    GFR calc non Af Amer 42 (*)    GFR calc Af Amer 49 (*)    All other components within normal limits  LACTIC ACID, PLASMA - Abnormal; Notable for the following components:   Lactic Acid, Venous 3.5 (*)    All other components within normal limits  CBC WITH DIFFERENTIAL/PLATELET - Abnormal; Notable for the following components:   RBC 3.84 (*)    Hemoglobin 11.2 (*)    MCV 101.3 (*)    MCHC 28.8 (*)    Lymphs Abs 5.0 (*)    Abs Immature Granulocytes 0.15 (*)    All other components within normal limits  I-STAT ARTERIAL BLOOD  GAS, ED - Abnormal; Notable for the following components:   pH, Arterial 7.312 (*)    pCO2 arterial 60.1 (*)    pO2,  Arterial 51 (*)    Bicarbonate 30.4 (*)    Acid-Base Excess 3.0 (*)    Potassium 3.4 (*)    HCT 34.0 (*)    Hemoglobin 11.6 (*)    All other components within normal limits  SARS CORONAVIRUS 2 BY RT PCR (HOSPITAL ORDER, [facility])  CULTURE, BLOOD (ROUTINE X 2)  CULTURE, BLOOD (ROUTINE X 2)  URINE CULTURE  PROTIME-INR  LACTIC ACID, PLASMA  URINALYSIS, ROUTINE W REFLEX MICROSCOPIC  BRAIN NATRIURETIC PEPTIDE  TROPONIN I (HIGH SENSITIVITY)  TROPONIN I (HIGH SENSITIVITY)    EKG None  Radiology DG Chest Port 1 View  Result Date: 11/11/2019 CLINICAL DATA:  Repositioning of endotracheal tube EXAM: PORTABLE CHEST 1 VIEW COMPARISON:  11/11/2019, 11/10/2019, 10/20/2019 FINDINGS: Tracheostomy tube tip overlies the proximal right mainstem bronchus. Consider retracting by approximately 3 cm. Improved aeration bilaterally with residual ground-glass infiltrates. Normal heart size. No pneumothorax. IMPRESSION: Tracheostomy tube tip overlies the proximal right mainstem bronchus. Consider retracting by approximately 3 cm for more optimal positioning. Improved aeration bilaterally with residual ground-glass infiltrates. Critical Value/emergent results were called by telephone at the time of interpretation on 11/11/2019 at 3:31 am to provider [facility] , who verbally acknowledged these results. Electronically Signed   By: Donavan Foil M.D.   On: 11/11/2019 03:31   DG Chest Portable 1 View  Result Date: 11/11/2019 CLINICAL DATA:  Tracheostomy, shortness of breath, hypoxia EXAM: PORTABLE CHEST 1 VIEW COMPARISON:  11/10/2019 FINDINGS: Single frontal view of the chest demonstrates a tracheostomy tube tip overlying the right mainstem bronchus. Recommend retracting tracheostomy tube 2.5 cm. Cardiac silhouette is stable. There is developing consolidation within the left lung, which may be hypoventilatory given right mainstem intubation. Nonspecific consolidation at the right  lung base could be airspace disease or atelectasis. No effusion or pneumothorax. IMPRESSION: 1. Tracheostomy tube tip overlying right mainstem bronchus. Recommend retracting 2.5 cm. 2. Likely developing hypoventilatory changes bilaterally. Follow-up imaging after tracheostomy tube adjustment recommended to assess resolution. Critical Value/emergent results were called by telephone at the time of interpretation on 11/11/2019 at 1:51 am to provider [facility] , who verbally acknowledged these results. Electronically Signed   By: Randa Ngo M.D.   On: 11/11/2019 01:51   DG Chest Port 1 View  Result Date: 11/10/2019 CLINICAL DATA:  Shortness of breath. EXAM: PORTABLE CHEST 1 VIEW COMPARISON:  10/20/2019 FINDINGS: The cardiac silhouette, mediastinal and hilar contours are within normal limits and stable. Stable mild tortuosity and calcification of the thoracic aorta. Streaky basilar scarring changes but no infiltrates, edema or effusions. The bony thorax is intact. IMPRESSION: Streaky basilar scarring changes. No acute overlying pulmonary process. Electronically Signed   By: Marijo Sanes M.D.   On: 11/10/2019 07:43    Procedures Procedures (including critical care time)  Medications Ordered in ED Medications  lactated ringers infusion (has no administration in time range)  lactated ringers bolus 1,000 mL (has no administration in time range)    And  lactated ringers bolus 1,000 mL (has no administration in time range)  ceFEPIme (MAXIPIME) 2 g in sodium chloride 0.9 % 100 mL IVPB (has no administration in time range)  vancomycin (VANCOREADY) IVPB 1250 mg/250 mL (has no administration in time range)    ED Course  I have reviewed the triage vital signs and the  nursing notes.  Pertinent labs & imaging results that were available during my care of the patient were reviewed by me and considered in my medical decision making (see chart for details).    MDM Rules/Calculators/A&P                           Patient brought to the emergency department from home after developing respiratory distress.  Patient has a history of COPD and is status post laryngectomy secondary to cancer.  She has a tracheostomy that is not cannulated and does not utilize the ventilator at home.  EMS report that she did not improve with high flow oxygen directly applied to her trachea and therefore trachea was cannulated with a 6.0 endotracheal tube and she was bagged for transport.  This improved oxygenation.  At arrival, however, she had no breath sounds on the left.  Endotracheal tube was withdrawn until breath sounds were equal.  Chest x-ray showed that the endotracheal tube was in the right mainstem, tube was retracted again.  There was likely atelectatic changes in the left lung secondary to the right mainstem intubation.  Patient was ventilated for a period of time and then x-ray was repeated.  Despite equal breath sounds, this showed likely persistent right mainstem intubation and the endotracheal tube was retracted 3 to 4 cm again.  Patient was tachycardic at arrival but this resolved as she began to be oxygenated.  Her white count was not elevated and she was not febrile.  While here in the ER, however, she has become hypotensive.  Around the time that this developed, her lactic acid came back at 3.5.  She was therefore initiated on treatment for sepsis which was not obviously present at arrival.  She was given broad-spectrum antibiotics and a fluid bolus.  As she is currently ventilated, will consult critical care for intubation.  CRITICAL CARE Performed by: Orpah Greek   Total critical care time: 40 minutes  Critical care time was exclusive of separately billable procedures and treating other patients.  Critical care was necessary to treat or prevent imminent or life-threatening deterioration.  Critical care was time spent personally by me on the following activities: development of treatment  plan with patient and/or surrogate as well as nursing, discussions with consultants, evaluation of patient's response to treatment, examination of patient, obtaining history from patient or surrogate, ordering and performing treatments and interventions, ordering and review of laboratory studies, ordering and review of radiographic studies, pulse oximetry and re-evaluation of patient's condition.   Final Clinical Impression(s) / ED Diagnoses Final diagnoses:  Acute on chronic respiratory failure with hypoxia and hypercapnia ([place])    Rx / DC Orders ED Discharge Orders    None       Tyger Wichman, Gwenyth Allegra, MD 11/11/19 [phone]

## 2019-11-11 NOTE — ED Notes (Signed)
Pt fiance Mr. Edwards called would like an update on pt. Please call [phone]

## 2019-11-11 NOTE — Progress Notes (Signed)
Initial Nutrition Assessment  DOCUMENTATION CODES:   Not applicable  INTERVENTION:   If remains intubated for >/= 24 hours more, recommend place NG tube and begin TF: Vital AF 1.2 at 40 ml/h (960 ml per day) Prosource TF 45 ml once daily  Provides 1192 kcal, 83 gm protein, 779 ml free water daily   NUTRITION DIAGNOSIS:   Inadequate oral intake related to inability to eat as evidenced by NPO status.  GOAL:   Patient will meet greater than or equal to 90% of their needs  MONITOR:   Vent status, Labs, I & O's  REASON FOR ASSESSMENT:   Ventilator    ASSESSMENT:   69 yo female admitted with SOB requiring intubation via previous stoma. PMH includes laryngeal cancer s/p tumor resection and tracheostomy in 2019, former smoker, HTN, HLD, stroke, COPD.   Recent admission ~3 weeks ago for COPD exacerbation and HCAP. Patient had a poor appetite at that time.   Discussed patient in ICU rounds and with RN today. Patient remains on vent support with no enteral access.   Patient is currently intubated on ventilator support MV: 5.4 L/min Temp (24hrs), Avg:96.2 F (35.7 C), Min:96.1 F (35.6 C), Max:96.2 F (35.7 C)   Labs reviewed. K 3.4 CBG: 84 this AM  Medications reviewed and include precedex. IVF: LR at 150 ml/h  Recent weights reviewed, weight stable for the past month.  NUTRITION - FOCUSED PHYSICAL EXAM:  unable to complete  Diet Order:   Diet Order            Diet NPO time specified  Diet effective now                 EDUCATION NEEDS:   No education needs have been identified at this time  Skin:  Skin Assessment: Reviewed RN Assessment  Last BM:  no BM recorded since admission  Height:   Ht Readings from Last 1 Encounters:  11/11/19 4\' 11"  (1.499 m)    Weight:   Wt Readings from Last 1 Encounters:  10/31/19 61.4 kg    Ideal Body Weight:  44.7 kg  BMI:  Body mass index is 27.34 kg/m.  Estimated Nutritional Needs:   Kcal:   1140  Protein:  80-90 gm  Fluid:  >/= 1.5 L    Lucas Mallow, RD, LDN, CNSC Please refer to Amion for contact information.

## 2019-11-11 NOTE — Progress Notes (Signed)
Called to patient's bedside for increased agitation and complaints of shortness of breath.  Upon arrival to room, patient was agitated and not receiving all of her exhaled volumes on the ventilator.  RN gave medication for anxiety/ sedation.  Patient appeared more comfortable at that point, and then SpO2 decreased to the 70%.  Patient then manually ventilated with ambu bag with FiO2 100%.  SpO2 increased to 95-100% with that.  MD then exchanged her ETT with a 6.0 shiley legacy trach.  Placed back on ventilator with current settings.  Patient tolerated these settings well.  No complications noted.

## 2019-11-11 NOTE — Progress Notes (Signed)
Echocardiogram 2D Echocardiogram has been performed.  Oneal Deputy Tamre Cass 11/11/2019, 11:11 AM

## 2019-11-11 NOTE — ED Triage Notes (Signed)
Patient from home, woke up out of sleep with shortness of breath.  Patient was not getting enough oxygen from home oxygen unit.  Patient was found at 38% oxygen sat.  Patient became altered with oxygen rise per EMS.  Patient has large amount of mucous from stoma.  Patient was intubated with 6.0 tube thru stoma.  Patient remains unresponsive after oxygen rise to 95%.

## 2019-11-11 NOTE — Procedures (Signed)
Tracheostomy Change Note  Patient Details:   Name: Katelyn [name] DOB: 08-29-1950 MRN: [id number]    Airway Documentation: Patient had an endotracheal tube inserted through the stoma for respiratory failure Did develop a mucous plugging Tube was replaced with a size 6 cuffed Shiley over a bougie     Evaluation  O2 sats: stable throughout Complications: No apparent complications Patient did tolerate procedure well. Bilateral Breath Sounds: Diminished   Tolerated trach change well  Katelyn [name] A Ledon Weihe 11/11/2019, 4:07 PM

## 2019-11-11 NOTE — Progress Notes (Addendum)
ANTICOAGULATION CONSULT NOTE - Initial Consult  Pharmacy Consult for Lovenox Indication: History of a DVT  No Known Allergies  Patient Measurements: Height: 4\' 11"  (149.9 cm) IBW/kg (Calculated) : 43.2  Vital Signs: BP: 153/77 (09/17 1500) Pulse Rate: 45 (09/17 1500)  Labs: Recent Labs    11/11/19 0154 11/11/19 0247 11/11/19 0500 11/11/19 0947  HGB 11.2* 11.6*  --   --   HCT 38.9 34.0*  --   --   PLT 191  --   --   --   LABPROT 15.0  --   --   --   INR 1.2  --   --   --   CREATININE 1.28*  --   --   --   TROPONINIHS  --   --  34* 40*    Estimated Creatinine Clearance: 32.6 mL/min (A) (by C-G formula based on SCr of 1.28 mg/dL (H)).   Medical History: Past Medical History:  Diagnosis Date  . Acute gallstone pancreatitis 01/08/2014  . AKI (acute kidney injury) ([place]) 01/08/2014  . Anemia   . Blood transfusion    11'15 last admission  . Cancer ([place])   . Cholecystitis   . H/O tracheostomy   . Hip fracture ([place])   . Hypercholesteremia   . Hypertension   . Right leg DVT ([place]) 05/08/2014  . Septic shock - Kelbsiella 01/09/2014  . Shortness of breath   . Stroke [facility])    '13- right side weakness, ambulates with cane   Assessment: 69 years of age female on Apixaban therapy prior to admission for history of a DVT and CVA now admitted for respiratory distress and intubation with NPO status and no enteral access at this time. Discussed with MD - to start Lovenox therapy and pharmacy consulted to help with dosing.   SCr 1.28 (baseline ~0.9 to 1) with CrCl ~32 mL/min.  Making urine at this time.  Family reports her last dose of Apixaban therapy was on 9/15 - she was unable to take the therapy on 9/16.   Goal of Therapy:  Anti-Xa level 0.6-1 units/ml 4hrs after LMWH dose given  As appropriate Monitor platelets by anticoagulation protocol: Yes   Plan:  Lovenox 60mg  SQ every 12 hours.  Monitor renal function and need to adjust dose.  Monitor CBC and for any signs or  symptoms of bleeding.   Sloan Leiter, PharmD, BCPS, BCCCP Clinical Pharmacist Please refer to [facility] for [place] numbers 11/11/2019,4:03 PM

## 2019-11-11 NOTE — Plan of Care (Signed)

## 2019-11-11 NOTE — H&P (Signed)
NAME:  Katelyn [name], MRN:  [id number], DOB:  [birth year], LOS: 0 ADMISSION DATE:  11/11/2019,  CHIEF COMPLAINT: Short of breath  Brief History   69 year old female that presented with shortness of breath required intubation via previous stoma.  History of present illness   This is a 69 year old black female that presented to the emergency room from home.  She has an extensive medical history.  In 2015, she was found to have a laryngeal mass but did not follow-up on that until 2019.  At that time she was found to have stage IV laryngeal cancer.  She was taken to the operating room and had resection of the tumor and a tracheostomy placed at that time.  Since then she has been decannulated but the stoma has remained in place.  She was recently hospitalized from 12 August to 24 August and then from 26 August-31 August.  She was given a course of broad-spectrum antibiotics for right lower lobe infiltrate.  There is also a new concern for COPD and was put on Solu-Medrol at that time.  She been discharged home and on 16 September she presented back to the emergency room complaining of diarrhea and shortness of breath.  She been discharged to home with home oxygen.  Today she presented back to the emergency room with increasing shortness of breath, and productive cough.  Patient was found to be significantly hypoxic on arrival by EMS.   Antimicrobials:  Vancomycin Cefepime    Objective   Blood pressure 115/73, pulse (!) 110, temperature (!) 96.1 F (35.6 C), temperature source Tympanic, resp. rate 14, height 4\' 11"  (1.499 m), SpO2 100 %.    Vent Mode: PRVC FiO2 (%):  [100 %] 100 % Set Rate:  [16 bmp] 16 bmp Vt Set:  [340 mL] 340 mL PEEP:  [5 cmH20] 5 cmH20 Plateau Pressure:  [15 cmH20-30 cmH20] 15 cmH20  No intake or output data in the 24 hours ending 11/11/19 0630 There were no vitals filed for this visit.  Examination: General: No acute distress HENT: Atraumatic/normocephalic #6  endotracheal tube is in good position through the stoma of the neck.  There is no active bleeding seen. Lungs: Bilateral rhonchi in all lung fields with scattered rales.  No wheezing noted. Cardiovascular: Regular rate unable to appreciate murmur rub or gallop but could be masked by the rhonchi. Abdomen: Distended, nontender, hypoactive bowel sounds, no rebound rigidity or guarding appreciated. Extremities: Trace edema in the lower extremities.  No cyanosis.  Distal pulses intact x4. Neuro: Patient's awake and alert.  She clearly mouths words and interacts with her surroundings.  Following commands.  Generally diffusely weak.   Assessment & Plan:  Acute hypoxic respiratory failure Tracheobronchitis HFpEF Stage IV laryngeal cancer Hypertension Chronic anemia  Plan: Patient is admitted to the intensive care unit for further work-up. Will reevaluate the heart i.e. echocardiogram, troponins, BNP now. Started on standard ventilator protocol via the endotracheal tube within the stoma.  Discussed conversion of endotracheal tube to tracheostomy tube with the patient and the patient's daughter.  They would like to try to wean her from the ventilator rather than performing the tracheostomy tube. We will put the patient on broad-spectrum antibiotics but have a low threshold to quickly narrow. Sending procalcitonin level. Send stool for C. Difficile. N.p.o. except for meds. Monitor I's/O's. Continue Eliquis    Best practice:  Diet: N.p.o. Pain/Anxiety/Delirium protocol (if indicated): Precedex VAP protocol (if indicated): Initiated DVT prophylaxis: Patient's on Eliquis and will continue. GI  prophylaxis: Protonix Glucose control:  Mobility: Bedrest Code Status: Full Family Communication: Updated daughter at bedside. Disposition: Admit to the intensive care unit  Labs   CBC: Recent Labs  Lab 11/11/19 0154 11/11/19 0247  WBC 10.4  --   NEUTROABS 4.4  --   HGB 11.2* 11.6*  HCT 38.9  34.0*  MCV 101.3*  --   PLT 191  --     Basic Metabolic Panel: Recent Labs  Lab 11/11/19 0154 11/11/19 0247  NA 144 145  K 3.4* 3.4*  CL 104  --   CO2 28  --   GLUCOSE 169*  --   BUN 13  --   CREATININE 1.28*  --   CALCIUM 9.0  --    GFR: Estimated Creatinine Clearance: 32.6 mL/min (A) (by C-G formula based on SCr of 1.28 mg/dL (H)). Recent Labs  Lab 11/11/19 0154  WBC 10.4  LATICACIDVEN 3.5*    Liver Function Tests: Recent Labs  Lab 11/11/19 0154  AST 32  ALT 26  ALKPHOS 97  BILITOT 0.8  PROT 6.9  ALBUMIN 3.4*   No results for input(s): LIPASE, AMYLASE in the last 168 hours. No results for input(s): AMMONIA in the last 168 hours.  ABG    Component Value Date/Time   PHART 7.312 (L) 11/11/2019 0247   PCO2ART 60.1 (H) 11/11/2019 0247   PO2ART 51 (L) 11/11/2019 0247   HCO3 30.4 (H) 11/11/2019 0247   TCO2 32 11/11/2019 0247   ACIDBASEDEF 5.8 (H) 10/06/2019 1438   O2SAT 81.0 11/11/2019 0247     Coagulation Profile: Recent Labs  Lab 11/11/19 0154  INR 1.2    Cardiac Enzymes: No results for input(s): CKTOTAL, CKMB, CKMBINDEX, TROPONINI in the last 168 hours.  HbA1C: Hgb A1c MFr Bld  Date/Time Value Ref Range Status  10/07/2019 04:13 AM 5.4 4.8 - 5.6 % Final    Comment:    (NOTE) Pre diabetes:          5.7%-6.4%  Diabetes:              >6.4%  Glycemic control for   <7.0% adults with diabetes   03/02/2011 06:02 AM 5.6 <5.7 % Final    Comment:    (NOTE)                                                                       According to the ADA Clinical Practice Recommendations for 2011, when HbA1c is used as a screening test:  >=6.5%   Diagnostic of Diabetes Mellitus           (if abnormal result is confirmed) 5.7-6.4%   Increased risk of developing Diabetes Mellitus References:Diagnosis and Classification of Diabetes Mellitus,Diabetes HYQM,5784,69(GEXBM 1):S62-S69 and Standards of Medical Care in         Diabetes - 2011,Diabetes  WUXL,2440,10 (Suppl 1):S11-S61.    CBG: No results for input(s): GLUCAP in the last 168 hours.  Review of Systems:   Unable to be completed  Past Medical History  She,  has a past medical history of Acute gallstone pancreatitis (01/08/2014), AKI (acute kidney injury) ([place]) (01/08/2014), Anemia, Blood transfusion, Cancer ([place]), Cholecystitis, H/O tracheostomy, Hip fracture ([place]), Hypercholesteremia, Hypertension, Right leg DVT ([place]) (05/08/2014), Septic shock -  Kelbsiella (01/09/2014), Shortness of breath, and Stroke ([place]).   Surgical History    Past Surgical History:  Procedure Laterality Date  . ABDOMINAL HYSTERECTOMY    . DIRECT LARYNGOSCOPY N/A 06/08/2017   Procedure: DIRECT LARYNGOSCOPY WITH BIOPSY;  Surgeon: Helayne Seminole, MD;  Location: [facility] OR;  Service: ENT;  Laterality: N/A;  . ENDOSCOPIC RETROGRADE CHOLANGIOPANCREATOGRAPHY (ERCP) WITH PROPOFOL N/A 01/11/2014   Procedure: ENDOSCOPIC RETROGRADE CHOLANGIOPANCREATOGRAPHY (ERCP) WITH PROPOFOL;  Surgeon: Gatha Mayer, MD;  Location: [facility];  Service: Endoscopy;  Laterality: N/A;  . ERCP    . ERCP N/A 05/26/2014   Procedure: ENDOSCOPIC RETROGRADE CHOLANGIOPANCREATOGRAPHY (ERCP);  Surgeon: Gatha Mayer, MD;  Location: [facility] ENDOSCOPY;  Service: Endoscopy;  Laterality: N/A;  . ESOPHAGOGASTRODUODENOSCOPY (EGD) WITH PROPOFOL N/A 12/07/2014   Procedure: ESOPHAGOGASTRODUODENOSCOPY (EGD) WITH PROPOFOL;  Surgeon: Milus Banister, MD;  Location: [facility] ENDOSCOPY;  Service: Endoscopy;  Laterality: N/A;  . EUS N/A 12/07/2014   Procedure: UPPER ENDOSCOPIC ULTRASOUND (EUS) LINEAR;  Surgeon: Milus Banister, MD;  Location: [facility] ENDOSCOPY;  Service: Endoscopy;  Laterality: N/A;  . GASTROINTESTINAL STENT REMOVAL N/A 12/07/2014   Procedure: GASTROINTESTINAL STENT REMOVAL;  Surgeon: Milus Banister, MD;  Location: [facility] ENDOSCOPY;  Service: Endoscopy;  Laterality: N/A;  pancreatic stent removal  . HIP FRACTURE SURGERY    . INTRAMEDULLARY (IM) NAIL  INTERTROCHANTERIC Right 05/29/2017   Procedure: INTRAMEDULLARY (IM) NAIL INTERTROCHANTRIC FEMUR;  Surgeon: Nicholes Stairs, MD;  Location: [facility];  Service: Orthopedics;  Laterality: Right;  . PERCUTANEOUS TRACHEOSTOMY N/A 05/29/2017   Procedure: PERCUTANEOUS TRACHEOSTOMY;  Surgeon: Judeth Horn, MD;  Location: [facility];  Service: General;  Laterality: N/A;  . TEE WITHOUT CARDIOVERSION  03/06/2011   Procedure: TRANSESOPHAGEAL ECHOCARDIOGRAM (TEE);  Surgeon: Jolaine Artist, MD;  Location: [facility] ENDOSCOPY;  Service: Cardiovascular;  Laterality: N/A;     Social History   reports that she quit smoking about 2 years ago. Her smoking use included cigarettes. She smoked 0.25 packs per day. She has never used smokeless tobacco. She reports that she does not drink alcohol and does not use drugs.   Family History   Her family history includes Breast cancer in her maternal aunt; Diabetes Mellitus I in her father and sister. There is no history of Anesthesia problems, Hypotension, Malignant hyperthermia, Pseudochol deficiency, Colon cancer, Esophageal cancer, Pancreatic cancer, Kidney disease, or Liver disease.   Allergies No Known Allergies   Home Medications  Prior to Admission medications   Medication Sig Start Date End Date Taking? Authorizing Provider  ALPRAZolam Duanne Moron) 0.5 MG tablet Take 1 tablet (0.5 mg total) by mouth 3 (three) times daily as needed for anxiety. 10/18/19   Swayze, Ava, DO  apixaban (ELIQUIS) 5 MG TABS tablet Take 1 tablet (5 mg total) by mouth 2 (two) times daily. 11/04/19   Love, Ivan Anchors, PA-C  atorvastatin (LIPITOR) 80 MG tablet Take 1 tablet (80 mg total) by mouth daily. 10/18/19   Swayze, Ava, DO  guaiFENesin-dextromethorphan (ROBITUSSIN DM) 100-10 MG/5ML syrup Take 10 mLs by mouth every 6 (six) hours. 10/18/19   Swayze, Ava, DO  melatonin 3 MG TABS tablet Take 1 tablet (3 mg total) by mouth at bedtime as needed (insomnia). 11/04/19   Love, Ivan Anchors, PA-C  metoprolol tartrate  (LOPRESSOR) 25 MG tablet Take 1 tablet (25 mg total) by mouth 2 (two) times daily. 11/04/19   Love, Ivan Anchors, PA-C  nystatin cream (MYCOSTATIN) Apply 1 application topically 2 (two) times daily. 11/04/19   Reesa Chew  S, PA-C  polycarbophil (FIBERCON) 625 MG tablet Take 1 tablet (625 mg total) by mouth daily. 11/05/19   Bary Leriche, PA-C     Critical care time: 30mins

## 2019-11-12 ENCOUNTER — Inpatient Hospital Stay (HOSPITAL_COMMUNITY): Payer: Medicare Other

## 2019-11-12 LAB — CBC
HCT: 33.7 % — ABNORMAL LOW (ref 36.0–46.0)
Hemoglobin: 10.2 g/dL — ABNORMAL LOW (ref 12.0–15.0)
MCH: 29.2 pg (ref 26.0–34.0)
MCHC: 30.3 g/dL (ref 30.0–36.0)
MCV: 96.6 fL (ref 80.0–100.0)
Platelets: 182 10*3/uL (ref 150–400)
RBC: 3.49 MIL/uL — ABNORMAL LOW (ref 3.87–5.11)
RDW: 13.3 % (ref 11.5–15.5)
WBC: 9.8 10*3/uL (ref 4.0–10.5)
nRBC: 0 % (ref 0.0–0.2)

## 2019-11-12 LAB — BASIC METABOLIC PANEL
Anion gap: 10 (ref 5–15)
BUN: 23 mg/dL (ref 8–23)
CO2: 24 mmol/L (ref 22–32)
Calcium: 8.8 mg/dL — ABNORMAL LOW (ref 8.9–10.3)
Chloride: 106 mmol/L (ref 98–111)
Creatinine, Ser: 1.18 mg/dL — ABNORMAL HIGH (ref 0.44–1.00)
GFR calc Af Amer: 54 mL/min — ABNORMAL LOW (ref >60)
GFR calc non Af Amer: 47 mL/min — ABNORMAL LOW (ref >60)
Glucose, Bld: 119 mg/dL — ABNORMAL HIGH (ref 70–99)
Potassium: 4.2 mmol/L (ref 3.5–5.1)
Sodium: 140 mmol/L (ref 135–145)

## 2019-11-12 LAB — GLUCOSE, CAPILLARY: Glucose-Capillary: 151 mg/dL — ABNORMAL HIGH (ref 70–99)

## 2019-11-12 LAB — PHOSPHORUS: Phosphorus: 3.2 mg/dL (ref 2.5–4.6)

## 2019-11-12 LAB — MAGNESIUM: Magnesium: 1.3 mg/dL — ABNORMAL LOW (ref 1.7–2.4)

## 2019-11-12 MED ORDER — MAGNESIUM SULFATE 50 % IJ SOLN
6.0000 g | Freq: Once | INTRAVENOUS | Status: AC
  Administered 2019-11-12: 6 g via INTRAVENOUS
  Filled 2019-11-12: qty 10

## 2019-11-12 MED ORDER — MAGNESIUM SULFATE 4 GM/100ML IV SOLN: 4.0000 g | Freq: Once | INTRAVENOUS | Status: DC

## 2019-11-12 MED ORDER — LORAZEPAM 2 MG/ML IJ SOLN
0.5000 mg | Freq: Once | INTRAMUSCULAR | Status: AC
  Administered 2019-11-12: 0.5 mg via INTRAVENOUS
  Filled 2019-11-12: qty 1

## 2019-11-12 MED ORDER — MAGNESIUM SULFATE 2 GM/50ML IV SOLN: 2.0000 g | Freq: Once | INTRAVENOUS | Status: DC

## 2019-11-12 MED ORDER — LORAZEPAM 2 MG/ML IJ SOLN
1.0000 mg | INTRAMUSCULAR | Status: DC | PRN
  Administered 2019-11-12 – 2019-11-13 (×2): 1 mg via INTRAVENOUS
  Filled 2019-11-12 (×2): qty 1

## 2019-11-12 NOTE — Progress Notes (Signed)
NAME:  Katelyn [name], MRN:  [id number], DOB:  01-15-1951, LOS: 1 ADMISSION DATE:  11/11/2019,  CHIEF COMPLAINT: Short of breath  Brief History   69 year old female that presented with shortness of breath required intubation via previous stoma.  History of present illness   This is a 69 year old black female that presented to the emergency room from home.  She has an extensive medical history.  In 2015, she was found to have a laryngeal mass but did not follow-up on that until 2019.  At that time she was found to have stage IV laryngeal cancer.  She was taken to the operating room and had resection of the tumor and a tracheostomy placed at that time.  Since then she has been decannulated but the stoma has remained in place.  She was recently hospitalized from 12 August to 24 August and then from 26 August-31 August.  She was given a course of broad-spectrum antibiotics for right lower lobe infiltrate.  There is also a new concern for COPD and was put on Solu-Medrol at that time.  She been discharged home and on 16 September she presented back to the emergency room complaining of diarrhea and shortness of breath.  She been discharged to home with home oxygen.  Today she presented back to the emergency room with increasing shortness of breath, and productive cough.  Patient was found to be significantly hypoxic on arrival by EMS.   Antimicrobials:  Vancomycin Cefepime    Objective   Blood pressure (!) 95/59, pulse (!) 59, temperature 98.2 F (36.8 C), temperature source Oral, resp. rate 16, height 4\' 11"  (1.499 m), weight 63.8 kg, SpO2 100 %.    Vent Mode: PRVC FiO2 (%):  [50 %-100 %] 50 % Set Rate:  [16 bmp] 16 bmp Vt Set:  [340 mL] 340 mL PEEP:  [5 cmH20] 5 cmH20 Plateau Pressure:  [14 cmH20-16 cmH20] 14 cmH20   Intake/Output Summary (Last 24 hours) at 11/12/2019 1029 Last data filed at 11/12/2019 0800 Gross per 24 hour  Intake 2810.9 ml  Output 400 ml  Net 2410.9 ml   Filed  Weights   11/12/19 0359  Weight: 63.8 kg    Examination: GEN: no acute distress HEENT: trach  In place, copious bloody secretions CV: RRR, ext warm PULM: Rhonci R>L, tachypneic with low TV on PS GI: Soft, hypoactive bS EXT: No edema NEURO: moves all 4 ext to command PSYCH: RASS +1, anxious SKIN: No rashes  Mg low, repleted Stable mild CKD Stable CBC Echo benign  Assessment & Plan:  Acute on chronic hypoxemic respiratory failure due to staph pneumonia - Continue vanc/cefepime, narrow pending tracheal aspirate cultures - May do well with monotherapy with linezolid - VAP prevention bundle, continue MV support for now, may benefit from lary tube once a little better recovered  Baseline anxiety, need for sedation with mechanical ventilation - See how she does with diet, if tolerates can resume PTA xanax - Precedex and PRN ativan ordered  Hx largyngeal cancer post laryngectomy; pT3pN0 supraglottic carcinoma - Was followed by Dr. Eudelia B[facility] - Should probably re establish care at some point  Deconditioning present on admission- PT consult  Hx of PE- on full dose lovenox  Prior CVA with R sided weakness  Best practice:  Diet: trial of dysphagia diet (s/p laryngectomy so should be able to vent + eat) Pain/Anxiety/Delirium protocol (if indicated): Precedex VAP protocol (if indicated): Initiated DVT prophylaxis: Patient's on Eliquis and will continue. GI prophylaxis: Protonix  Glucose control:  Mobility: Bedrest Code Status: Full Family Communication: Updated patient Disposition: ICU pending vent liberation    The patient is critically ill with multiple organ systems failure and requires high complexity decision making for assessment and support, frequent evaluation and titration of therapies, application of advanced monitoring technologies and extensive interpretation of multiple databases. Critical Care Time devoted to patient care services described in this note  independent of APP/resident time (if applicable)  is 45 minutes.   Erskine Emery MD [facility] 11/12/2019 10:52 AM Personal pager: [phone] If unanswered, please page CCM On-call: [phone]

## 2019-11-12 NOTE — Progress Notes (Signed)
SLP Cancellation Note  Patient Details Name: Katelyn [name] MRN: [id number] DOB: 06-Feb-1951   Cancelled treatment:       Reason Eval/Treat Not Completed: Other (comment)   Patient with h/o total laryngectomy. Due to the anatomical changes associated with total  laryngectomy, the Passy?Muir Valve can not be used on this population. Discussed with RN. If SLP services are needed to facilitate ordering and/or use of electrolarynx or assist with communication otherwise, please re-consult.   Gabriel Rainwater MA, CCC-SLP     Katelyn [name] Meryl 11/12/2019, 2:03 PM

## 2019-11-12 NOTE — Progress Notes (Signed)
[facility] ADULT ICU REPLACEMENT PROTOCOL   The patient does apply for the [facility] Adult ICU Electrolyte Replacment Protocol based on the criteria listed below:   1. Is GFR >/= 30 ml/min? Yes.    Patient's GFR today is 54 2. Is SCr </= 2? Yes.   Patient's SCr is 1.18 ml/kg/hr 3. Did SCr increase >/= 0.5 in 24 hours? No. 4. Abnormal electrolyte(s): Mg 1.3 5. Ordered repletion with: Protocol 6. If a panic level lab has been reported, has the CCM MD in charge been notified? Yes.  .   Physician:  Ignacia Marvel 11/12/2019 4:22 AM

## 2019-11-12 NOTE — Progress Notes (Signed)
[facility] Progress Note Patient Name: Evah Rashid DOB: 29-Mar-1950 MRN: [id number]   Date of Service  11/12/2019  HPI/Events of Note  Bedside RN indicates that patient is anxious and unable to sleep, she is NPO.  eICU Interventions  Ativan 0.5 mg iv x 1 ordered.        Kerry Kass Cyrah Mclamb 11/12/2019, 3:13 AM

## 2019-11-13 DIAGNOSIS — J9622 Acute and chronic respiratory failure with hypercapnia: Secondary | ICD-10-CM

## 2019-11-13 DIAGNOSIS — J9621 Acute and chronic respiratory failure with hypoxia: Secondary | ICD-10-CM

## 2019-11-13 LAB — CULTURE, RESPIRATORY W GRAM STAIN

## 2019-11-13 LAB — CBC
HCT: 28.4 % — ABNORMAL LOW (ref 36.0–46.0)
Hemoglobin: 8.7 g/dL — ABNORMAL LOW (ref 12.0–15.0)
MCH: 29.9 pg (ref 26.0–34.0)
MCHC: 30.6 g/dL (ref 30.0–36.0)
MCV: 97.6 fL (ref 80.0–100.0)
Platelets: 211 10*3/uL (ref 150–400)
RBC: 2.91 MIL/uL — ABNORMAL LOW (ref 3.87–5.11)
RDW: 14 % (ref 11.5–15.5)
WBC: 6.8 10*3/uL (ref 4.0–10.5)
nRBC: 0 % (ref 0.0–0.2)

## 2019-11-13 LAB — BASIC METABOLIC PANEL
Anion gap: 6 (ref 5–15)
BUN: 25 mg/dL — ABNORMAL HIGH (ref 8–23)
CO2: 24 mmol/L (ref 22–32)
Calcium: 8.1 mg/dL — ABNORMAL LOW (ref 8.9–10.3)
Chloride: 107 mmol/L (ref 98–111)
Creatinine, Ser: 1.12 mg/dL — ABNORMAL HIGH (ref 0.44–1.00)
GFR calc Af Amer: 58 mL/min — ABNORMAL LOW (ref >60)
GFR calc non Af Amer: 50 mL/min — ABNORMAL LOW (ref >60)
Glucose, Bld: 103 mg/dL — ABNORMAL HIGH (ref 70–99)
Potassium: 5.4 mmol/L — ABNORMAL HIGH (ref 3.5–5.1)
Sodium: 137 mmol/L (ref 135–145)

## 2019-11-13 LAB — MAGNESIUM: Magnesium: 2.9 mg/dL — ABNORMAL HIGH (ref 1.7–2.4)

## 2019-11-13 LAB — PHOSPHORUS: Phosphorus: 1.6 mg/dL — ABNORMAL LOW (ref 2.5–4.6)

## 2019-11-13 LAB — GLUCOSE, CAPILLARY: Glucose-Capillary: 114 mg/dL — ABNORMAL HIGH (ref 70–99)

## 2019-11-13 MED ORDER — ORAL CARE MOUTH RINSE
15.0000 mL | Freq: Two times a day (BID) | OROMUCOSAL | Status: DC
  Administered 2019-11-15 – 2019-11-21 (×13): 15 mL via OROMUCOSAL

## 2019-11-13 MED ORDER — CHLORHEXIDINE GLUCONATE 0.12 % MT SOLN
15.0000 mL | Freq: Two times a day (BID) | OROMUCOSAL | Status: DC
  Administered 2019-11-14 – 2019-11-21 (×14): 15 mL via OROMUCOSAL
  Filled 2019-11-13 (×13): qty 15

## 2019-11-13 MED ORDER — SODIUM PHOSPHATES 45 MMOLE/15ML IV SOLN
20.0000 mmol | Freq: Once | INTRAVENOUS | Status: AC
  Administered 2019-11-13: 20 mmol via INTRAVENOUS
  Filled 2019-11-13: qty 6.67

## 2019-11-13 MED ORDER — VANCOMYCIN HCL IN DEXTROSE 1-5 GM/200ML-% IV SOLN
1000.0000 mg | INTRAVENOUS | Status: DC
  Administered 2019-11-13: 1000 mg via INTRAVENOUS
  Filled 2019-11-13 (×2): qty 200

## 2019-11-13 NOTE — Progress Notes (Signed)
Pharmacy Antibiotic Note  Katelyn [name] is a 69 y.o. female admitted on 11/11/2019 with pneumonia with staph aureus in the TA.  Pharmacy has been consulted for Vancomycin dosing. CrCL 38 ml/min   Plan: Vancomycin 1 gram IV q24hr Monitor renal function, clinical status, C&S and vanc levels as needed   Height: 4\' 11"  (149.9 cm) Weight: 65.4 kg (144 lb 2.9 oz) IBW/kg (Calculated) : 43.2  Temp (24hrs), Avg:98 F (36.7 C), Min:97.6 F (36.4 C), Max:98.5 F (36.9 C)  Recent Labs  Lab 11/11/19 0154 11/11/19 0500 11/12/19 0203 11/13/19 0153  WBC 10.4  --  9.8 6.8  CREATININE 1.28*  --  1.18* 1.12*  LATICACIDVEN 3.5* 2.0*  --   --     Estimated Creatinine Clearance: 38.4 mL/min (A) (by C-G formula based on SCr of 1.12 mg/dL (H)).    No Known Allergies  Antimicrobials this admission: Cefep 9/17 >>  Vanc 9/19 >>   Microbiology results:  9/17 Sputum: moderate staph aureus  9/17 MRSA PCR: neg  Thank you for allowing pharmacy to be a part of this patient's care.  Alanda Slim, PharmD, [facility] Clinical Pharmacist Please see AMION for all Pharmacists' Contact Phone Numbers 11/13/2019, 8:11 AM

## 2019-11-13 NOTE — Progress Notes (Signed)
NAME:  Katelyn [name], MRN:  [id number], DOB:  30-Apr-1950, LOS: 2 ADMISSION DATE:  11/11/2019,  CHIEF COMPLAINT: Short of breath  Brief History   69 year old female that presented with shortness of breath required intubation via previous stoma.  History of present illness   This is a 69 year old black female that presented to the emergency room from home.  She has an extensive medical history.  In 2015, she was found to have a laryngeal mass but did not follow-up on that until 2019.  At that time she was found to have stage IV laryngeal cancer.  She was taken to the operating room and had resection of the tumor and a tracheostomy placed at that time.  Since then she has been decannulated but the stoma has remained in place.  She was recently hospitalized from 12 August to 24 August and then from 26 August-31 August.  She was given a course of broad-spectrum antibiotics for right lower lobe infiltrate.   There is also a new concern for COPD and was put on Solu-Medrol at that time.  She been discharged home and on 16 September she presented back to the emergency room complaining of diarrhea and shortness of breath.  She been discharged to home with home oxygen.  9/17 she presented back to the emergency room with increasing shortness of breath, and productive cough.  Patient was found to be significantly hypoxic on arrival by EMS.  Past Medical History  Stroke Right leg DVT HTN HLD Laryngeal cancer AKI  Significant Hospital Events   Admitted 9/17  Consults:    Procedures:    Significant Diagnostic Tests:  CXR 9/19 > bibasilar atelectasis and suspected right pleural effusion   Micro Data:  COVID 9/17 > Negative  MRSA PCR 9/17 > Negative  Respiratory culture 9/17 >Moderate staph aureus, susceptibilities pending  Blood culture 9/17 >  Antimicrobials:  Cefepime 9/17 >   Interim history/subjective:  Sitting up in bed preparing for breakfast. States she feels well with no acute  complaints.   Objective   Blood pressure (!) 149/99, pulse 82, temperature 97.6 F (36.4 C), temperature source Oral, resp. rate 20, height 4\' 11"  (1.499 m), weight 65.4 kg, SpO2 100 %.    Vent Mode: PRVC FiO2 (%):  [40 %-50 %] 40 % Set Rate:  [16 bmp] 16 bmp Vt Set:  [340 mL] 340 mL PEEP:  [5 cmH20] 5 cmH20 Plateau Pressure:  [16 cmH20] 16 cmH20   Intake/Output Summary (Last 24 hours) at 11/13/2019 0727 Last data filed at 11/13/2019 0600 Gross per 24 hour  Intake 2121.93 ml  Output 430 ml  Net 1691.93 ml   Filed Weights   11/12/19 0359 11/13/19 0500  Weight: 63.8 kg 65.4 kg    Examination: General: Pleasant elderly female lying in bed on mechanical ventilation, in NAD HEENT: 6 cuffed shiley trach midline, MM pink/moist, PERRL,  Neuro: Alert and oriented x3, non-focal  CV: s1s2 regular rate and rhythm, no murmur, rubs, or gallops,  PULM:  Slightly diminished breath sounds, no increased work of breathing, tolerating vent well  GI: soft, bowel sounds active in all 4 quadrants, non-tender, non-distended Extremities: warm/dry, no edema  Skin: no rashes or lesions   Resolved Hospital Problem list     Assessment & Plan:  Acute on chronic hypoxemic respiratory failure due to staph pneumonia P: Continue ventilator support with lung protective strategies  Wean PEEP and FiO2 for sats greater than 90%. Head of bed elevated 30 degrees. Plateau pressures  less than 30 cm H20.  Follow intermittent chest x-ray and ABG.   Continue trials of SBT  Ensure adequate pulmonary hygiene  Follow cultures  VAP bundle in place  PAD protocol Continue Cefepime Resume vancomycin with moderate staph aures seen on culture  Consider transition to monotherapy with Linezolid pending susceptibilities   Baseline anxiety, need for sedation with mechanical ventilation - See how she does with diet, if tolerates can resume PTA xanax - Precedex and PRN ativan ordered  Hx largyngeal cancer post  laryngectomy; pT3pN0 supraglottic carcinoma - Was followed by Dr. Eudelia Bunch [facility] - Should probably re establish care at some point P: Supportive care  Deconditioning present on admission P: PT/O evals Mobilize as able  Optimize nutrition   Hx of PE P: Continue full dose Lovenox   Prior CVA with R sided weakness P: Supportive care Therapies as above   Best practice:  Diet: trial of dysphagia diet (s/p laryngectomy so should be able to vent + eat) Pain/Anxiety/Delirium protocol (if indicated): Precedex VAP protocol (if indicated): Initiated DVT prophylaxis: Patient's on Eliquis and will continue. GI prophylaxis: Protonix Glucose control:  Mobility: Bedrest Code Status: Full Family Communication: Updated patient Disposition: ICU pending vent liberation  CRITICAL CARE Performed by: Johnsie Cancel  Total critical care time: 37 minutes  Critical care time was exclusive of separately billable procedures and treating other patients.  Critical care was necessary to treat or prevent imminent or life-threatening deterioration.  Critical care was time spent personally by me on the following activities: development of treatment plan with patient and/or surrogate as well as nursing, discussions with consultants, evaluation of patient's response to treatment, examination of patient, obtaining history from patient or surrogate, ordering and performing treatments and interventions, ordering and review of laboratory studies, ordering and review of radiographic studies, pulse oximetry and re-evaluation of patient's condition.  Johnsie Cancel, NP-C [facility] Contact / Pager information can be found on Amion  11/13/2019, 7:33 AM

## 2019-11-14 LAB — CBC
HCT: 30.6 % — ABNORMAL LOW (ref 36.0–46.0)
Hemoglobin: 9 g/dL — ABNORMAL LOW (ref 12.0–15.0)
MCH: 28.9 pg (ref 26.0–34.0)
MCHC: 29.4 g/dL — ABNORMAL LOW (ref 30.0–36.0)
MCV: 98.4 fL (ref 80.0–100.0)
Platelets: 178 10*3/uL (ref 150–400)
RBC: 3.11 MIL/uL — ABNORMAL LOW (ref 3.87–5.11)
RDW: 13.7 % (ref 11.5–15.5)
WBC: 5.1 10*3/uL (ref 4.0–10.5)
nRBC: 0 % (ref 0.0–0.2)

## 2019-11-14 LAB — BASIC METABOLIC PANEL
Anion gap: 8 (ref 5–15)
Anion gap: 10 (ref 5–15)
BUN: 13 mg/dL (ref 8–23)
BUN: 11 mg/dL (ref 8–23)
CO2: 31 mmol/L (ref 22–32)
CO2: 31 mmol/L (ref 22–32)
Calcium: 8.8 mg/dL — ABNORMAL LOW (ref 8.9–10.3)
Calcium: 9 mg/dL (ref 8.9–10.3)
Chloride: 106 mmol/L (ref 98–111)
Chloride: 105 mmol/L (ref 98–111)
Creatinine, Ser: 1.03 mg/dL — ABNORMAL HIGH (ref 0.44–1.00)
Creatinine, Ser: 1.04 mg/dL — ABNORMAL HIGH (ref 0.44–1.00)
GFR calc Af Amer: >60 mL/min (ref >60)
GFR calc Af Amer: >60 mL/min (ref >60)
GFR calc non Af Amer: 55 mL/min — ABNORMAL LOW (ref >60)
GFR calc non Af Amer: 54 mL/min — ABNORMAL LOW (ref >60)
Glucose, Bld: 100 mg/dL — ABNORMAL HIGH (ref 70–99)
Glucose, Bld: 114 mg/dL — ABNORMAL HIGH (ref 70–99)
Potassium: 3.2 mmol/L — ABNORMAL LOW (ref 3.5–5.1)
Potassium: 3.5 mmol/L (ref 3.5–5.1)
Sodium: 145 mmol/L (ref 135–145)
Sodium: 146 mmol/L — ABNORMAL HIGH (ref 135–145)

## 2019-11-14 LAB — PHOSPHORUS: Phosphorus: 3.6 mg/dL (ref 2.5–4.6)

## 2019-11-14 LAB — MAGNESIUM: Magnesium: 1.8 mg/dL (ref 1.7–2.4)

## 2019-11-14 MED ORDER — POTASSIUM CHLORIDE 10 MEQ/100ML IV SOLN: 10.0000 meq | INTRAVENOUS | Status: DC

## 2019-11-14 MED ORDER — HYDRALAZINE HCL 20 MG/ML IJ SOLN
10.0000 mg | INTRAMUSCULAR | Status: DC | PRN
  Administered 2019-11-14: 10 mg via INTRAVENOUS
  Filled 2019-11-14: qty 1

## 2019-11-14 MED ORDER — POTASSIUM CHLORIDE CRYS ER 20 MEQ PO TBCR
40.0000 meq | EXTENDED_RELEASE_TABLET | Freq: Once | ORAL | Status: AC
  Administered 2019-11-14: 40 meq via ORAL
  Filled 2019-11-14: qty 2

## 2019-11-14 MED ORDER — ALPRAZOLAM 0.5 MG PO TABS
0.5000 mg | ORAL_TABLET | Freq: Three times a day (TID) | ORAL | Status: DC | PRN
  Administered 2019-11-15 – 2019-11-20 (×7): 0.5 mg via ORAL
  Filled 2019-11-14 (×7): qty 1

## 2019-11-14 MED ORDER — CEFAZOLIN SODIUM-DEXTROSE 2-4 GM/100ML-% IV SOLN
2.0000 g | Freq: Three times a day (TID) | INTRAVENOUS | Status: AC
  Administered 2019-11-14 – 2019-11-17 (×12): 2 g via INTRAVENOUS
  Filled 2019-11-14 (×13): qty 100

## 2019-11-14 MED ORDER — MAGNESIUM SULFATE 2 GM/50ML IV SOLN
2.0000 g | Freq: Once | INTRAVENOUS | Status: AC
  Administered 2019-11-14: 2 g via INTRAVENOUS
  Filled 2019-11-14: qty 50

## 2019-11-14 MED ORDER — METOPROLOL TARTRATE 25 MG PO TABS
25.0000 mg | ORAL_TABLET | Freq: Two times a day (BID) | ORAL | Status: DC
  Administered 2019-11-14 – 2019-11-21 (×15): 25 mg via ORAL
  Filled 2019-11-14: qty 1
  Filled 2019-11-14: qty 2
  Filled 2019-11-14 (×13): qty 1

## 2019-11-14 NOTE — TOC Initial Note (Addendum)
Transition of Care [facility]) - Initial/Assessment Note    Patient Details  Name: Katelyn [name] MRN: [id number] Date of Birth: 1950-06-02  Transition of Care [facility]) CM/SW Contact:    Verdell Carmine, RN Phone Number: 11/14/2019, 4:54 PM  Clinical Narrative:                 Patient in the ICU now weaning on vent trials of food , patient has previous laryngectomy. Inquired with physician group critical care about the potential for LTAC. They believe she is ready to progress. Called granddaughter to update and ask about places for LTAC. Left message for her to call back.  Grandaughter called back and stated that ltac seems like a good idea. Patient has since moved to progressive care.  Will be followed by CM, [facility] aware of patient. Needs to be discussed with patient.    Expected Discharge Plan: Long Term Acute Care (LTAC) Barriers to Discharge: No Barriers Identified   Patient Goals and CMS Choice     Choice offered to / list presented to : Adult Children  Expected Discharge Plan and Services Expected Discharge Plan: [place] (LTAC)                                              Prior Living Arrangements/Services     Patient language and need for interpreter reviewed:: Yes        Need for Family Participation in Patient Care: Yes (Comment) Care giver support system in place?: Yes (comment)   Criminal Activity/Legal Involvement Pertinent to Current Situation/Hospitalization: No - Comment as needed  Activities of Daily Living      Permission Sought/Granted                  Emotional Assessment   Attitude/Demeanor/Rapport: Intubated (Following Commands or Not Following Commands)   Orientation: : Fluctuating Orientation (Suspected and/or reported Sundowners) Alcohol / Substance Use: Not Applicable Psych Involvement: No (comment)  Admission diagnosis:  Acute respiratory failure requiring reintubation ([place]) [J96.00] Acute on chronic  respiratory failure with hypoxia and hypercapnia ([place]) [P53.61, J96.22] Patient Active Problem List   Diagnosis Date Noted  . Acute on chronic respiratory failure with hypoxia and hypercapnia (HCC)   . Acute respiratory failure requiring reintubation ([place]) 11/11/2019  . Loose stools 11/06/2019  . Benign essential HTN   . Chronic obstructive pulmonary disease ([place])   . Debility 10/25/2019  . Acute hypoxemic respiratory failure ([place]) 10/21/2019  . Goals of care, counseling/discussion   . Palliative care by specialist   . History of noncompliance with medical treatment   . COPD exacerbation ([place]) 10/06/2019  . Generalized weakness 07/12/2017  . Palliative care encounter 07/12/2017  . 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 Pressure injury of skin 05/30/2017  . Malnutrition of moderate degree 05/30/2017  . Hip fx, right, closed, initial encounter ([place]) 05/29/2017  . Closed displaced intertrochanteric fracture of right femur ([place]) 05/29/2017  . Dysphagia 05/29/2017  . Weight loss 05/29/2017  . Difficult airway for intubation 05/29/2017  . Lesion of epiglottis 05/26/2014  . Abnormal CT scan, pancreas or bile duct   . Right leg DVT ([place]) 05/08/2014  . Anemia 01/09/2014  . Hip pain, right 05/06/2011  . 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 CKD (chronic kidney disease) stage 3, GFR 30-59 ml/min (HCC) 03/04/2011  . Diastolic dysfunction 44/31/5400  .  Tobacco abuse 03/04/2011  . Constipation 03/04/2011  . Malignant hypertensive urgency 03/01/2011  . Cerebral infarction ([place]) 03/01/2011  . NEUROPATHY, IDIOPATHIC 10/25/2007  . Hypercholesteremia 12/15/2006  . Essential hypertension 12/15/2006   PCP:  Aretta Nip, MD Pharmacy:   [place]), [place] - [address] [place] [address] Phone: [phone] Fax: [phone]     Social Determinants of Health (SDOH) Interventions    Readmission  Risk Interventions No flowsheet data found.

## 2019-11-14 NOTE — Progress Notes (Signed)
Initial Nutrition Assessment  DOCUMENTATION CODES:   Non-severe (moderate) malnutrition in context of chronic illness  INTERVENTION:   - Curator Breakfast with whole milk TID with meals, each supplement mixed with whole milk provides 275 kcal and 13 grams of protein  - Encourage adequate PO intake  NUTRITION DIAGNOSIS:   Moderate Malnutrition related to chronic illness (COPD, laryngeal cancer s/p tumor resection and tracheostomy) as evidenced by mild fat depletion, mild muscle depletion, moderate muscle depletion.  New diagnosis after completion of NFPE  GOAL:   Patient will meet greater than or equal to 90% of their needs  Progressing  MONITOR:   PO intake, Supplement acceptance, Labs, Weight trends, I & O's  REASON FOR ASSESSMENT:   Ventilator    ASSESSMENT:   69 yo female admitted with SOB requiring intubation via previous stoma. PMH includes laryngeal cancer s/p tumor resection and tracheostomy in 2019, former smoker, HTN, HLD, stroke, COPD.  9/18 - diet advanced to dysphagia 3 9/19 - diet advanced to soft  Discussed pt with RN and during ICU rounds. Per notes, pt has been on trach collar for about 24 hours. Per RN, pt eating okay but does not have a great appetite.  Spoke with pt at bedside. Pt reports appetite is decreased but that she ate "some" for breakfast. Difficult to obtain history from pt at this time. Pt endorses losing weight recently.  Pt requesting Carnation Instant Breakfast with whole milk rather than Ensure Enlive. RD to order via [place].  Reviewed weight history in chart. Weight trending up over the last month, from 61.3 kg on 10/07/19 to 65.4 kg today. RD will continue to monitor trends.  Medications reviewed and include: protonix, IV mag sulfate 2 grams once, IV abx  Labs reviewed: potassium 3.2, hemoglobin 9.0 CBG's: 114  UOP: 4950 ml x 24 hours I/O's: +946 ml since admit  NUTRITION - FOCUSED PHYSICAL  EXAM:    Most Recent Value  Orbital Region Mild depletion  Upper Arm Region Mild depletion  Thoracic and Lumbar Region No depletion  Buccal Region Mild depletion  [place] Region Mild depletion  Clavicle Bone Region Moderate depletion  Clavicle and Acromion Bone Region Moderate depletion  Scapular Bone Region Mild depletion  Dorsal Hand Mild depletion  Patellar Region Mild depletion  Anterior Thigh Region Mild depletion  Posterior Calf Region Mild depletion  Edema (RD Assessment) Mild  [BLE]  Hair Reviewed  Eyes Reviewed  Mouth Reviewed  Skin Reviewed  Nails Reviewed       Diet Order:   Diet Order            DIET SOFT Room service appropriate? Yes; Fluid consistency: Thin  Diet effective now                 EDUCATION NEEDS:   No education needs have been identified at this time  Skin:  Skin Assessment: Reviewed RN Assessment  Last BM:  11/14/19  Height:   Ht Readings from Last 1 Encounters:  11/11/19 4\' 11"  (1.499 m)    Weight:   Wt Readings from Last 1 Encounters:  11/14/19 65.4 kg    Ideal Body Weight:  44.7 kg  BMI:  Body mass index is 29.12 kg/m.  Estimated Nutritional Needs:   Kcal:  1550-1750  Protein:  75-90 grams  Fluid:  >/= 1.5 L    Gaynell Face, MS, RD, LDN Inpatient Clinical Dietitian Please see AMiON for contact information.

## 2019-11-14 NOTE — Progress Notes (Signed)
[place] Progress Note Patient Name: Katelyn [name] DOB: 11-08-1950 MRN: [id number]   Date of Service  11/14/2019  HPI/Events of Note  Hypertension - BP = 168/78. Goal SBP appears to be < 140 from Metoprolol PRN order.   eICU Interventions  Plan: 1. Hydralazine 10 mg IV Q 4 hours PRN SBP > 140.      Intervention Category Major Interventions: Hypertension - evaluation and management  Jearline Hirschhorn [place] 11/14/2019, 2:24 AM

## 2019-11-14 NOTE — Progress Notes (Signed)
[facility] PCCM  Cased discussed with TOC team.  Given her progress over the last several days but deconditioned state in the setting of multiple chronic illnesses it would be best to consider LTAC placement for her.  I believe she would be a good candidate for this considering her current medical condition.  Roselie Awkward, MD [place] PCCM Pager: [phone] Cell: [phone] If no response, call [phone]

## 2019-11-14 NOTE — Progress Notes (Signed)
[facility] ADULT ICU REPLACEMENT PROTOCOL   The patient does apply for the [facility] Adult ICU Electrolyte Replacment Protocol based on the criteria listed below:   1. Is GFR >/= 30 ml/min? Yes.    Patient's GFR today is >60 2. Is SCr </= 2? Yes.   Patient's SCr is 1.03 ml/kg/hr 3. Did SCr increase >/= 0.5 in 24 hours? No. 4. Abnormal electrolyte(s): k 3.2, mag 1.8 5. Ordered repletion with: protocol 6. If a panic level lab has been reported, has the CCM MD in charge been notified? No..   Physician:    Ronda Fairly A 11/14/2019 6:42 AM

## 2019-11-14 NOTE — Plan of Care (Signed)
  Problem: Education: Goal: Knowledge of General Education information will improve Description Including pain rating scale, medication(s)/side effects and non-pharmacologic comfort measures Outcome: Progressing   

## 2019-11-14 NOTE — Progress Notes (Signed)
Pharmacy Antibiotic Note  Katelyn [name] is a 69 y.o. female admitted on 11/11/2019 with pneumonia with staph aureus in the TA. Patient initially started on empiric cefepime and vancomycin. Respiratory cultures now showing MSSA. Pharmacy has been consulted for cefazolin dosing.   Today, she remains afebrile with a WBC of 5.1. Her scr has down trended slowly, now 1.03 with CrCL ~42 ml/min. Will likely continue antibiotics to complete 7 days of therapy but will continue to reassess the patient's condition.    Plan: Start cefazolin 2g IV q8h  Monitor renal function, clinical status, cultures F/u length of therapy    Height: 4\' 11"  (149.9 cm) Weight: 65.4 kg (144 lb 2.9 oz) IBW/kg (Calculated) : 43.2  Temp (24hrs), Avg:98.1 F (36.7 C), Min:97.6 F (36.4 C), Max:98.4 F (36.9 C)  Recent Labs  Lab 11/11/19 0154 11/11/19 0500 11/12/19 0203 11/13/19 0153 11/14/19 0233  WBC 10.4  --  9.8 6.8 5.1  CREATININE 1.28*  --  1.18* 1.12* 1.03*  LATICACIDVEN 3.5* 2.0*  --   --   --     Estimated Creatinine Clearance: 41.8 mL/min (A) (by C-G formula based on SCr of 1.03 mg/dL (H)).    No Known Allergies  Antimicrobials this admission: Cefep 9/17>>9/20 Vanc 9/17 >>9/17, 9/19>>9/20 Cefazolin 9/20 >>   Microbiology results: 9/17 BCx x2>> ngtd 9/17 Sputum: moderate staph aureus > MSSA 9/17 MRSA PCR: neg  Thank you for allowing pharmacy to be a part of this patient's care.  Claudina Lick, PharmD PGY1 Acute Care Pharmacy Resident 11/14/2019 8:32 AM  Please check [email or web address] for unit-specific pharmacy phone numbers.

## 2019-11-14 NOTE — Progress Notes (Addendum)
NAME:  Katelyn [name], MRN:  [id number], DOB:  1951/01/03, LOS: 3 ADMISSION DATE:  11/11/2019,  CHIEF COMPLAINT: Short of breath  Brief History   69 year old female that presented with shortness of breath required intubation via previous stoma.  History of present illness   This is a 69 year old black female that presented to the emergency room from home.  She has an extensive medical history.  In 2015, she was found to have a laryngeal mass but did not follow-up on that until 2019.  At that time she was found to have stage IV laryngeal cancer.  She was taken to the operating room and had resection of the tumor and a tracheostomy placed at that time.  Since then she has been decannulated but the stoma has remained in place.  She was recently hospitalized from 12 August to 24 August and then from 26 August-31 August.  She was given a course of broad-spectrum antibiotics for right lower lobe infiltrate.   There is also a new concern for COPD and was put on Solu-Medrol at that time.  She been discharged home and on 16 September she presented back to the emergency room complaining of diarrhea and shortness of breath.  She been discharged to home with home oxygen.  9/17 she presented back to the emergency room with increasing shortness of breath, and productive cough.  Patient was found to be significantly hypoxic on arrival by EMS.  Past Medical History  Stroke Right leg DVT HTN HLD Laryngeal cancer AKI  Significant Hospital Events   Admitted 9/17  Consults:    Procedures:    Significant Diagnostic Tests:  CXR 9/19 > bibasilar atelectasis and suspected right pleural effusion   Micro Data:  COVID 9/17 > Negative  MRSA PCR 9/17 > Negative  Respiratory culture 9/17 >Moderate staph aureus, MSSA. R to erythro, clinda Blood culture 9/17 >  Antimicrobials:  Cefepime 9/17 >   Interim history/subjective:  On trach collar this morning and tolerating well. Has been on trach collar for  about 24 hours.     Objective   Blood pressure (!) 161/66, pulse 70, temperature 97.6 F (36.4 C), temperature source Axillary, resp. rate (!) 25, height 4\' 11"  (1.499 m), weight 65.4 kg, SpO2 96 %.    Vent Mode: CPAP;PSV FiO2 (%):  [28 %-40 %] 28 % PEEP:  [5 cmH20] 5 cmH20 Pressure Support:  [12 cmH20] 12 cmH20   Intake/Output Summary (Last 24 hours) at 11/14/2019 0756 Last data filed at 11/14/2019 0600 Gross per 24 hour  Intake 1864.39 ml  Output 4950 ml  Net -3085.61 ml   Filed Weights   11/12/19 0359 11/13/19 0500 11/14/19 0500  Weight: 63.8 kg 65.4 kg 65.4 kg    Examination:  General: Pleasant elderly female in NAD on ATC HEENT: 6 cuffed shiley trach midline, [place]/AT, PERRL, no JVD Neuro: Alert, oriented, non-focal.  CV: RRR, no MRG, No edema.  PULM:  Clear bilateral breath sounds, cough is non-productive.  GI: soft, non-tender, non-focal.  Extremities: warm/dry, no edema  Skin: no rashes or lesions   Resolved Hospital Problem list     Assessment & Plan:  Acute on chronic hypoxemic respiratory failure due to MSSA pneumonia She is s/p laryngectomy so she should not be aspirating food despite coughing P: Continue ATC as tolerated Abx to Ancef Dc Cefepime, vancomycin Suspect she will decannulate quickly.   Baseline anxiety, need for sedation with mechanical ventilation P: PRN xanax per home regimen  Hx largyngeal cancer post  laryngectomy; pT3pN0 supraglottic carcinoma - Was followed by Dr. Eudelia Bunch [facility] P: Supportive care Will need to re-establish care with Dr. Vicie Mutters  Deconditioning present on admission P: PT/OT evals Mobilize as able  Optimize nutrition   HTN - restart home metoprolol  Hx of PE P: Continue full dose Lovenox  Holding home eliquis for now  Prior CVA with R sided weakness P: Supportive care Therapies as above   Best practice:  Diet: trial of diet (s/p laryngectomy so should be able to vent + eat) Pain/Anxiety/Delirium  protocol (if indicated):NA VAP protocol (if indicated): NA DVT prophylaxis: Full dose lovenox GI prophylaxis: Protonix Glucose control:  Mobility: Bedrest Code Status: Full Family Communication: Updated patient Disposition:Will transfer out to vent progressive.    Georgann Housekeeper, AGACNP-BC [place]  See Amion for personal pager PCCM on call pager [phone]  11/14/2019 8:30 AM

## 2019-11-15 LAB — CBC
HCT: 30.9 % — ABNORMAL LOW (ref 36.0–46.0)
Hemoglobin: 9.3 g/dL — ABNORMAL LOW (ref 12.0–15.0)
MCH: 29.2 pg (ref 26.0–34.0)
MCHC: 30.1 g/dL (ref 30.0–36.0)
MCV: 96.9 fL (ref 80.0–100.0)
Platelets: 179 10*3/uL (ref 150–400)
RBC: 3.19 MIL/uL — ABNORMAL LOW (ref 3.87–5.11)
RDW: 13.4 % (ref 11.5–15.5)
WBC: 5.7 10*3/uL (ref 4.0–10.5)
nRBC: 0 % (ref 0.0–0.2)

## 2019-11-15 LAB — BASIC METABOLIC PANEL
Anion gap: 9 (ref 5–15)
BUN: 11 mg/dL (ref 8–23)
CO2: 31 mmol/L (ref 22–32)
Calcium: 8.7 mg/dL — ABNORMAL LOW (ref 8.9–10.3)
Chloride: 102 mmol/L (ref 98–111)
Creatinine, Ser: 1.02 mg/dL — ABNORMAL HIGH (ref 0.44–1.00)
GFR calc Af Amer: >60 mL/min (ref >60)
GFR calc non Af Amer: 56 mL/min — ABNORMAL LOW (ref >60)
Glucose, Bld: 88 mg/dL (ref 70–99)
Potassium: 3.2 mmol/L — ABNORMAL LOW (ref 3.5–5.1)
Sodium: 142 mmol/L (ref 135–145)

## 2019-11-15 LAB — PHOSPHORUS: Phosphorus: 2.8 mg/dL (ref 2.5–4.6)

## 2019-11-15 LAB — MAGNESIUM: Magnesium: 2 mg/dL (ref 1.7–2.4)

## 2019-11-15 MED ORDER — POTASSIUM CHLORIDE 20 MEQ PO PACK
40.0000 meq | PACK | ORAL | Status: AC
  Administered 2019-11-15 (×2): 40 meq via ORAL
  Filled 2019-11-15 (×2): qty 2

## 2019-11-15 NOTE — Care Management (Signed)
K[facility]n [name] with [facility] called and offered patient a bed at [facility] today. Spoke to attending , patient will be ready for bed tomorrow. Katelyn [name] unable to hold bed for patient, however she does have some discharges tomorrow and may have a bed available. Patient and MD aware.   Discussed [facility] with patient, patient unsure at this time regarding transfer to [facility]. NCM encouraged her to think about it and discuss with her grand daughter . NCM called Katelyn [name] [phone] no answer.   [name] RN

## 2019-11-15 NOTE — Evaluation (Signed)
Physical Therapy Evaluation Patient Details Name: Katelyn [name] MRN: [id number] DOB: January 11, 1951 Today's Date: 11/15/2019   History of Present Illness  Katelyn [name] is a 69 y.o. female with medical history significant of laryngeal cancer s/p laryngectomy, tracheostomy, gallstone pancreatitis, history of right hip fracture, hyperlipidemia, hypertension, right lower extremity DVT, history of Klebsiella septic shock, history of other nonhemorrhagic CVA with right-sided weakness who was discharged 10/18/19 after a 12-day hospitalization due to respiratory failure secondary to COPD exacerbation, bilateral pneumonia and aspiration pneumonia.  She returns to the emergency department after complaining of exacerbation of dyspnea twice overnight requiring each time to be seen by EMS.   Clinical Impression  Patient received in bed. Agrees to PT session. Non-verbal but will nod appropriately and mouth words. She was able to transfer supine to sit with min guard assist. Coughing throughout mobility. Motioning that she would like suctioning and needs nurse. RN called to room. Patient unwilling to attempt standing or getting up to recliner this session. She will continue to benefit from skilled PT while here to improve functional mobility and independence for return home with family.     Follow Up Recommendations Home health PT    Equipment Recommendations  Other (comment) (unsure at this time, no family present)    Recommendations for Other Services       Precautions / Restrictions Precautions Precautions: Fall Precaution Comments: Trach Restrictions Weight Bearing Restrictions: No      Mobility  Bed Mobility Overal bed mobility: Needs Assistance Bed Mobility: Supine to Sit;Sit to Supine     Supine to sit: Min guard Sit to supine: Min guard   General bed mobility comments: requires min guard to get LEs fully around to edge of bed.  Transfers                 General transfer  comment: patient not willing to attempt this visit. Wants suctioning, coughing throughout session.  Ambulation/Gait             General Gait Details: not attempted this visit  Stairs            Wheelchair Mobility    Modified Rankin (Stroke Patients Only)       Balance Overall balance assessment: Needs assistance Sitting-balance support: Bilateral upper extremity supported;Feet unsupported Sitting balance-Leahy Scale: Fair Sitting balance - Comments: patient reliant on B UE assist.                                     Pertinent Vitals/Pain Pain Assessment: No/denies pain    Home Living Family/patient expects to be discharged to:: Unsure Living Arrangements: Spouse/significant other Available Help at Discharge: Family;Available 24 hours/day Type of Home: House Home Access: Ramped entrance     Home Layout: One level Home Equipment: Clinical cytogeneticist - 4 wheels;Cane - single point;Toilet riser;Bedside commode Additional Comments: Family frequently checking in on pt, fiance retired and many family members can physically assist    Prior Function Level of Independence: Independent with assistive device(s)         Comments: ambulatory with RW around home. Reports able to complete ADLs without assist ( per admission from 2 weeks ago)     Hand Dominance   Dominant Hand: Right    Extremity/Trunk Assessment   Upper Extremity Assessment Upper Extremity Assessment: Overall WFL for tasks assessed    Lower Extremity Assessment Lower Extremity Assessment: Overall WFL for tasks assessed  Communication   Communication: Tracheostomy;Expressive difficulties  Cognition Arousal/Alertness: Awake/alert Behavior During Therapy: WFL for tasks assessed/performed Overall Cognitive Status: Within Functional Limits for tasks assessed                                 General Comments: non verbal but appropriate responses and answers by  mouthing words. Appears [facility]      General Comments      Exercises     Assessment/Plan    PT Assessment Patient needs continued PT services  PT Problem List Decreased strength;Decreased mobility;Decreased activity tolerance;Decreased balance;Cardiopulmonary status limiting activity       PT Treatment Interventions DME instruction;Gait training;Therapeutic activities;Therapeutic exercise;Functional mobility training;Balance training;Patient/family education    PT Goals (Current goals can be found in the Care Plan section)  Acute Rehab PT Goals Patient Stated Goal: none stated Time For Goal Achievement: 11/29/19    Frequency Min 3X/week   Barriers to discharge        Co-evaluation               AM-PAC PT "6 Clicks" Mobility  Outcome Measure Help needed turning from your back to your side while in a flat bed without using bedrails?: A Little Help needed moving from lying on your back to sitting on the side of a flat bed without using bedrails?: A Little Help needed moving to and from a bed to a chair (including a wheelchair)?: A Little Help needed standing up from a chair using your arms (e.g., wheelchair or bedside chair)?: A Little Help needed to walk in hospital room?: A Lot Help needed climbing 3-5 steps with a railing? : Total 6 Click Score: 15    End of Session Equipment Utilized During Treatment: Oxygen Activity Tolerance: Patient tolerated treatment well Patient left: in bed;with call bell/phone within reach;with nursing/sitter in room Nurse Communication: Mobility status PT Visit Diagnosis: Muscle weakness (generalized) (M62.81);Other abnormalities of gait and mobility (R26.89)    Time: 2072-1828 PT Time Calculation (min) (ACUTE ONLY): 18 min   Charges:   PT Evaluation $PT Eval Moderate Complexity: 1 Mod          Tinzley Dalia, PT, GCS 11/15/19,10:31 AM

## 2019-11-15 NOTE — Progress Notes (Signed)
PROGRESS NOTE    Katelyn [name]  PRF:[id number] DOB: 07-29-1950 DOA: 11/11/2019 PCP: Aretta Nip, MD   Brief Narrative:  This is a 69 year old black female that presented to the emergency room from home.  She has an extensive medical history.  In 2015, she was found to have a laryngeal mass but did not follow-up on that until 2019.  At that time she was found to have stage IV laryngeal cancer.  She was taken to the operating room and had resection of the tumor and a tracheostomy placed at that time.  Since then she has been decannulated but the stoma has remained in place.  She was recently hospitalized from 12 August to 24 August and then from 26 August-31 August.  She was given a course of broad-spectrum antibiotics for right lower lobe infiltrate. There is also a new concern for COPD and was put on Solu-Medrol at that time.  She been discharged home and on 16 September she presented back to the emergency room complaining of diarrhea and shortness of breath. She been discharged to home with home oxygen.  9/17 she presented back to the emergency room with increasing shortness of breath, and productive cough.  Patient was found to be significantly hypoxic on arrival by EMS.   Assessment & Plan:   Active Problems:   Acute respiratory failure requiring reintubation (HCC)   Acute on chronic respiratory failure with hypoxia and hypercapnia (HCC)   Acute on chronic hypoxemic respiratory failure due to MSSA pneumonia - Hx of laryngectomy so she should not be aspirating food despite coughing - Continue ATC as tolerated - Continue Ancef; DC Cefepime, vancomycin - Pulm suspects she will decannulate quickly  Anxiety - somewhat poorly controlled PRN xanax per home regimen Improving now off the ventilator  Hx largyngeal cancer post laryngectomy; T3-N0 supraglottic carcinoma - Followed by Dr. Eudelia Bunch [facility] - Will need to re-establish care with Dr. Marylene Land as above possibly  to be decanulated per PCCM/Pulm  Deconditioning/Protein caloric malnutrition/ambulatory dysfunction present on admission PT/OT evals Mobilize as able  Optimize nutrition   HTN - Continue home metoprolol  Hx of PE Continue full dose Lovenox  Holding home eliquis for now - likely transition back at discharge  Prior CVA with R sided weakness, stable Supportive care Therapies as above    DVT prophylaxis: Lovenox Code Status: Full Family Communication: None present, none available by phone  Status is: inpt  Dispo: The patient is from: Home              Anticipated d/c is to: TBD - likely LTAC              Anticipated d/c date is: 24-48h pending clinical course              Patient currently NOT medically stable for discharge due to ongoing evaluation and work-up with pulmonology, need to transition to safe disposition to continue IV antibiotics and close monitoring in the setting of hypoxia and recent MSSA pneumonia  Consultants:   PCCM  Antimicrobials:  Ancef  Subjective: No acute issues or events overnight, patient feels well; her only complaint today is that her food is cold and she would prefer something different to eat and is somewhat hungry but otherwise denies chest pain, nausea, vomiting, diarrhea, constipation, headache, fevers, chills.  Objective: Vitals:   11/15/19 0301 11/15/19 0313 11/15/19 0554 11/15/19 0818  BP:   (!) 155/82   Pulse: 88  92 89  Resp:  16  17 18   Temp:   97.7 F (36.5 C)   TempSrc:   Oral   SpO2: 97%  99% 98%  Weight:  65.6 kg    Height:        Intake/Output Summary (Last 24 hours) at 11/15/2019 9562 Last data filed at 11/15/2019 0555 Gross per 24 hour  Intake 149.98 ml  Output 1450 ml  Net -1300.02 ml   Filed Weights   11/13/19 0500 11/14/19 0500 11/15/19 0313  Weight: 65.4 kg 65.4 kg 65.6 kg    Examination:  General: Cachectic frail-appearing elderly female no acute distress resting comfortably in bed HEENT:  Tracheostomy in place with copious secretions Lungs: Coarse bilateral rhonchi likely reverberating from tracheostomy, without acute wheeze or rales Heart:  Regular rate and rhythm.  Without murmurs, rubs, or gallops. Abdomen:  Soft, nontender, nondistended.  Without guarding or rebound. Extremities: Without cyanosis, clubbing, edema, or obvious deformity. Vascular:  Dorsalis pedis and posterior tibial pulses palpable bilaterally. Skin:  Warm and dry, no erythema, no ulcerations.   Data Reviewed: I have personally reviewed following labs and imaging studies  CBC: Recent Labs  Lab 11/11/19 0154 11/11/19 0154 11/11/19 0247 11/12/19 0203 11/13/19 0153 11/14/19 0233 11/15/19 0123  WBC 10.4  --   --  9.8 6.8 5.1 5.7  NEUTROABS 4.4  --   --   --   --   --   --   HGB 11.2*   < > 11.6* 10.2* 8.7* 9.0* 9.3*  HCT 38.9   < > 34.0* 33.7* 28.4* 30.6* 30.9*  MCV 101.3*  --   --  96.6 97.6 98.4 96.9  PLT 191  --   --  182 211 178 179   < > = values in this interval not displayed.   Basic Metabolic Panel: Recent Labs  Lab 11/12/19 0203 11/13/19 0153 11/14/19 0233 11/14/19 1432 11/15/19 0123  NA 140 137 145 146* 142  K 4.2 5.4* 3.2* 3.5 3.2*  CL 106 107 106 105 102  CO2 24 24 31 31 31   GLUCOSE 119* 103* 100* 114* 88  BUN 23 25* 13 11 11   CREATININE 1.18* 1.12* 1.03* 1.04* 1.02*  CALCIUM 8.8* 8.1* 8.8* 9.0 8.7*  MG 1.3* 2.9* 1.8  --  2.0  PHOS 3.2 1.6* 3.6  --  2.8   GFR: Estimated Creatinine Clearance: 42.3 mL/min (A) (by C-G formula based on SCr of 1.02 mg/dL (H)). Liver Function Tests: Recent Labs  Lab 11/11/19 0154  AST 32  ALT 26  ALKPHOS 97  BILITOT 0.8  PROT 6.9  ALBUMIN 3.4*   No results for input(s): LIPASE, AMYLASE in the last 168 hours. No results for input(s): AMMONIA in the last 168 hours. Coagulation Profile: Recent Labs  Lab 11/11/19 0154  INR 1.2   Cardiac Enzymes: No results for input(s): CKTOTAL, CKMB, CKMBINDEX, TROPONINI in the last 168  hours. BNP (last 3 results) No results for input(s): PROBNP in the last 8760 hours. HbA1C: No results for input(s): HGBA1C in the last 72 hours. CBG: Recent Labs  Lab 11/11/19 0922 11/11/19 1635 11/11/19 1948 11/12/19 1948 11/13/19 2002  GLUCAP 84 134* 129* 151* 114*   Lipid Profile: No results for input(s): CHOL, HDL, LDLCALC, TRIG, CHOLHDL, LDLDIRECT in the last 72 hours. Thyroid Function Tests: No results for input(s): TSH, T4TOTAL, FREET4, T3FREE, THYROIDAB in the last 72 hours. Anemia Panel: No results for input(s): VITAMINB12, FOLATE, FERRITIN, TIBC, IRON, RETICCTPCT in the last 72 hours. Sepsis Labs: Recent  Labs  Lab 11/11/19 0154 11/11/19 0500 11/11/19 0947  PROCALCITON  --   --  0.80  LATICACIDVEN 3.5* 2.0*  --     Recent Results (from the past 240 hour(s))  SARS Coronavirus 2 by RT PCR (hospital order, performed in [facility] hospital lab) Nasopharyngeal Nasopharyngeal Swab     Status: None   Collection Time: 11/11/19  1:50 AM   Specimen: Nasopharyngeal Swab  Result Value Ref Range Status   SARS Coronavirus 2 NEGATIVE NEGATIVE Final    Comment: (NOTE) SARS-CoV-2 target nucleic acids are NOT DETECTED.  The SARS-CoV-2 RNA is generally detectable in upper and lower respiratory specimens during the acute phase of infection. The lowest concentration of SARS-CoV-2 viral copies this assay can detect is 250 copies / mL. A negative result does not preclude SARS-CoV-2 infection and should not be used as the sole basis for treatment or other patient management decisions.  A negative result may occur with improper specimen collection / handling, submission of specimen other than nasopharyngeal swab, presence of viral mutation(s) within the areas targeted by this assay, and inadequate number of viral copies (<250 copies / mL). A negative result must be combined with clinical observations, patient history, and epidemiological information.  Fact Sheet for Patients:    StrictlyIdeas.no  Fact Sheet for Healthcare Providers: BankingDealers.co.za  This test is not yet approved or  cleared by the [place] FDA and has been authorized for detection and/or diagnosis of SARS-CoV-2 by FDA under an Emergency Use Authorization (EUA).  This EUA will remain in effect (meaning this test can be used) for the duration of the COVID-19 declaration under Section 564(b)(1) of the Act, 21 U.S.C. section 360bbb-3(b)(1), unless the authorization is terminated or revoked sooner.  Performed at [facility], [place] [address] [place], [place] [address]   Culture, blood (Routine x 2)     Status: None (Preliminary result)   Collection Time: 11/11/19  1:54 AM   Specimen: BLOOD  Result Value Ref Range Status   Specimen Description BLOOD SITE NOT SPECIFIED  Final   Special Requests   Final    BOTTLES DRAWN AEROBIC AND ANAEROBIC Blood Culture adequate volume   Culture   Final    NO GROWTH 3 DAYS Performed at [facility], [address] [place], [place] [address]    Report Status PENDING  Incomplete  Culture, blood (Routine x 2)     Status: None (Preliminary result)   Collection Time: 11/11/19  5:15 AM   Specimen: BLOOD  Result Value Ref Range Status   Specimen Description BLOOD LEFT ARM  Final   Special Requests   Final    BOTTLES DRAWN AEROBIC AND ANAEROBIC Blood Culture adequate volume   Culture   Final    NO GROWTH 3 DAYS Performed at [facility], [address] [place], [place] [address]    Report Status PENDING  Incomplete  MRSA PCR Screening     Status: None   Collection Time: 11/11/19 10:32 AM   Specimen: Nasal Mucosa; Nasopharyngeal  Result Value Ref Range Status   MRSA by PCR NEGATIVE NEGATIVE Final    Comment:        The GeneXpert MRSA Assay (FDA approved for NASAL specimens only), is one component of a comprehensive MRSA colonization surveillance program. It is not intended to  diagnose MRSA infection nor to guide or monitor treatment for MRSA infections. Performed at [facility], [place] [address] [place], [place]  [address]   Culture, respiratory (non-expectorated)     Status: None   Collection Time: 11/11/19 11:33 AM   Specimen: Tracheal Aspirate; Respiratory  Result Value Ref Range Status   Specimen Description TRACHEAL ASPIRATE  Final   Special Requests NONE  Final   Gram Stain   Final    ABUNDANT WBC PRESENT,BOTH PMN AND MONONUCLEAR MODERATE GRAM POSITIVE COCCI Performed at [facility], [place] [address], [place], [place] [address]    Culture MODERATE STAPHYLOCOCCUS AUREUS  Final   Report Status 11/13/2019 FINAL  Final   Organism ID, Bacteria STAPHYLOCOCCUS AUREUS  Final      Susceptibility   Staphylococcus aureus - MIC*    CIPROFLOXACIN <=0.5 SENSITIVE Sensitive     ERYTHROMYCIN >=8 RESISTANT Resistant     GENTAMICIN <=0.5 SENSITIVE Sensitive     OXACILLIN <=0.25 SENSITIVE Sensitive     TETRACYCLINE <=1 SENSITIVE Sensitive     VANCOMYCIN 1 SENSITIVE Sensitive     TRIMETH/SULFA <=10 SENSITIVE Sensitive     CLINDAMYCIN RESISTANT Resistant     RIFAMPIN <=0.5 SENSITIVE Sensitive     Inducible Clindamycin POSITIVE Resistant     * MODERATE STAPHYLOCOCCUS AUREUS         Radiology Studies: No results found.      Scheduled Meds: . atorvastatin  80 mg Oral q1800  . chlorhexidine  15 mL Mouth Rinse BID  . Chlorhexidine Gluconate Cloth  6 each Topical Daily  . enoxaparin (LOVENOX) injection  60 mg Subcutaneous Q12H  . mouth rinse  15 mL Mouth Rinse q12n4p  . metoprolol tartrate  25 mg Oral BID  . potassium chloride  40 mEq Oral Q4H   Continuous Infusions: .  ceFAZolin (ANCEF) IV 2 g (11/15/19 0500)     LOS: 4 days   Time spent: 50min  Geneva Barrero C Thomasenia Dowse, DO [facility]  If 7PM-7AM, please contact night-coverage [email or web address]  11/15/2019, 8:21 AM

## 2019-11-15 NOTE — Progress Notes (Signed)
Trach change done at this time. Changed from 6 cuffed shiley to 6 cuffless. No issues. Positive color change. Trach secured per policy. Equipment at bedside

## 2019-11-15 NOTE — Progress Notes (Addendum)
SLP Cancellation Note  Patient Details Name: Katelyn [name] MRN: [id number] DOB: 1950/10/01   Cancelled treatment:        Orders for Passy Muir Valve assessment received.  Unfortunately, due to pt's hx of total laryngectomy and anatomical changes to the larynx and upper airway she will not be able to use a PMV.  Spoke with RN and critical care provider; consult is not needed at time.  SLP will sign off.  If there is an need for alternative/augmentative communication or  alaryngeal speech assessment, please reconsult.   Celedonio Savage , MA, [facility] Office: [phone]; Pager (9/21): [phone] 11/15/2019, 9:44 AM

## 2019-11-15 NOTE — Plan of Care (Signed)

## 2019-11-15 NOTE — Progress Notes (Signed)
NAME:  Katelyn [name], MRN:  [id number], DOB:  19-Jul-1950, LOS: 4 ADMISSION DATE:  11/11/2019,  CHIEF COMPLAINT: Short of breath  Brief History   69 year old female that presented with shortness of breath required intubation via previous stoma.  History of present illness   This is a 69 year old black female that presented to the emergency room from home.  She has an extensive medical history.  In 2015, she was found to have a laryngeal mass but did not follow-up on that until 2019.  At that time she was found to have stage IV laryngeal cancer.  She was taken to the operating room and had resection of the tumor and a tracheostomy placed at that time.  Since then she has been decannulated but the stoma has remained in place.  She was recently hospitalized from 12 August to 24 August and then from 26 August-31 August.  She was given a course of broad-spectrum antibiotics for right lower lobe infiltrate.   There is also a new concern for COPD and was put on Solu-Medrol at that time.  She been discharged home and on 16 September she presented back to the emergency room complaining of diarrhea and shortness of breath.  She been discharged to home with home oxygen.  9/17 she presented back to the emergency room with increasing shortness of breath, and productive cough.  Patient was found to be significantly hypoxic on arrival by EMS.  Past Medical History  Stroke Right leg DVT HTN HLD Laryngeal cancer AKI  Significant Hospital Events   Admitted 9/17  Consults:    Procedures:    Significant Diagnostic Tests:  CXR 9/19 > bibasilar atelectasis and suspected right pleural effusion   Micro Data:  COVID 9/17 > Negative  MRSA PCR 9/17 > Negative  Respiratory culture 9/17 >Moderate staph aureus, MSSA. R to erythro, clinda Blood culture 9/17 > negative  Antimicrobials:  Cefepime 9/17 > 11/14/2019 Ancef 11/14/2019>>  Interim history/subjective:  Remains on #6 cuffed trach with trach  collar she does have copious thick secretions.  Objective   Blood pressure (!) 155/82, pulse 89, temperature 97.7 F (36.5 C), temperature source Oral, resp. rate 18, height 4\' 11"  (1.499 m), weight 65.6 kg, SpO2 98 %.    FiO2 (%):  [28 %] 28 %   Intake/Output Summary (Last 24 hours) at 11/15/2019 0953 Last data filed at 11/15/2019 0555 Gross per 24 hour  Intake 149.98 ml  Output 1450 ml  Net -1300.02 ml   Filed Weights   11/13/19 0500 11/14/19 0500 11/15/19 0313  Weight: 65.4 kg 65.4 kg 65.6 kg    Examination:  General: Frail elderly female no acute distress HEENT: #6 cuffed trach in place with copious secretions Neuro: Grossly intact CV: Heart sounds are regular PULM: Coarse rhonchi bilaterally  GI: soft, bsx4 active  Extremities: warm/dry,  edema  Skin: no rashes or lesions    Resolved Hospital Problem list     Assessment & Plan:  Acute on chronic hypoxemic respiratory failure due to MSSA pneumonia She is s/p laryngectomy so she should not be aspirating food despite coughing P:  Currently on Ancef since 11/14/2019 Continue trach collar as tolerated Since she had a laryngectomy Passy-Muir valve not needed Consideration to downsizing trach when secretions are less thick and copious Consider changing to a #6 uncuffed trach When her secretions are better she can be decannulated to stoma   Continue ATC as tolerated Abx to Ancef Dc Cefepime, vancomycin Suspect she will decannulate quickly.  Baseline anxiety, need for sedation with mechanical ventilation P: PRN xanax per home regimen  Hx largyngeal cancer post laryngectomy; pT3pN0 supraglottic carcinoma - Was followed by Dr. Eudelia Bunch [facility] P: We will need to follow-up with Dr. Owens Shark  Deconditioning present on admission P: PT and OT Mobilize Nutrition  HTN - restart home metoprolol  Hx of PE P: Currently on full dose Lovenox Consider transition to oral agent  Prior CVA with R sided  weakness P: Continue supportive care  Best practice:  Diet: trial of diet (s/p laryngectomy so should be able to vent + eat) Pain/Anxiety/Delirium protocol (if indicated):NA VAP protocol (if indicated): NA DVT prophylaxis: Full dose lovenox GI prophylaxis: Protonix Glucose control:  Mobility: Bedrest Code Status: Full Family Communication: Pt updated at bedside Disposition: [place] Nurse Practitioner [place] Please consult [place] 11/15/2019, 9:53 AM

## 2019-11-16 ENCOUNTER — Encounter: Payer: Medicare Other | Attending: Registered Nurse | Admitting: Registered Nurse

## 2019-11-16 ENCOUNTER — Inpatient Hospital Stay (HOSPITAL_COMMUNITY): Payer: Medicare Other

## 2019-11-16 LAB — BASIC METABOLIC PANEL
Anion gap: 7 (ref 5–15)
BUN: 13 mg/dL (ref 8–23)
CO2: 32 mmol/L (ref 22–32)
Calcium: 8.8 mg/dL — ABNORMAL LOW (ref 8.9–10.3)
Chloride: 103 mmol/L (ref 98–111)
Creatinine, Ser: 0.89 mg/dL (ref 0.44–1.00)
GFR calc Af Amer: >60 mL/min (ref >60)
GFR calc non Af Amer: >60 mL/min (ref >60)
Glucose, Bld: 109 mg/dL — ABNORMAL HIGH (ref 70–99)
Potassium: 4 mmol/L (ref 3.5–5.1)
Sodium: 142 mmol/L (ref 135–145)

## 2019-11-16 LAB — CULTURE, BLOOD (ROUTINE X 2)
Culture: NO GROWTH
Culture: NO GROWTH
Special Requests: ADEQUATE
Special Requests: ADEQUATE

## 2019-11-16 LAB — CBC WITH DIFFERENTIAL/PLATELET
Abs Immature Granulocytes: 0.02 10*3/uL (ref 0.00–0.07)
Basophils Absolute: 0 10*3/uL (ref 0.0–0.1)
Basophils Relative: 0 %
Eosinophils Absolute: 0.1 10*3/uL (ref 0.0–0.5)
Eosinophils Relative: 2 %
HCT: 30.8 % — ABNORMAL LOW (ref 36.0–46.0)
Hemoglobin: 8.9 g/dL — ABNORMAL LOW (ref 12.0–15.0)
Immature Granulocytes: 0 %
Lymphocytes Relative: 34 %
Lymphs Abs: 1.6 10*3/uL (ref 0.7–4.0)
MCH: 28.5 pg (ref 26.0–34.0)
MCHC: 28.9 g/dL — ABNORMAL LOW (ref 30.0–36.0)
MCV: 98.7 fL (ref 80.0–100.0)
Monocytes Absolute: 0.5 10*3/uL (ref 0.1–1.0)
Monocytes Relative: 12 %
Neutro Abs: 2.4 10*3/uL (ref 1.7–7.7)
Neutrophils Relative %: 52 %
Platelets: 191 10*3/uL (ref 150–400)
RBC: 3.12 MIL/uL — ABNORMAL LOW (ref 3.87–5.11)
RDW: 13.5 % (ref 11.5–15.5)
WBC: 4.6 10*3/uL (ref 4.0–10.5)
nRBC: 0 % (ref 0.0–0.2)

## 2019-11-16 LAB — MAGNESIUM: Magnesium: 1.9 mg/dL (ref 1.7–2.4)

## 2019-11-16 LAB — PHOSPHORUS: Phosphorus: 1.8 mg/dL — ABNORMAL LOW (ref 2.5–4.6)

## 2019-11-16 MED ORDER — CEPHALEXIN 500 MG PO CAPS: 500.0000 mg | ORAL_CAPSULE | Freq: Four times a day (QID) | ORAL | 0 refills | Status: DC

## 2019-11-16 MED ORDER — ALPRAZOLAM 0.5 MG PO TABS: 0.5000 mg | ORAL_TABLET | Freq: Three times a day (TID) | ORAL | 0 refills | Status: AC | PRN

## 2019-11-16 NOTE — Progress Notes (Signed)
NAME:  Katelyn [name], MRN:  [id number], DOB:  11/17/1950, LOS: 5 ADMISSION DATE:  11/11/2019,  CHIEF COMPLAINT: Short of breath  Brief History   69 year old female that presented with shortness of breath required intubation via previous stoma.  History of present illness   This is a 69 year old black female that presented to the emergency room from home.  She has an extensive medical history.  In 2015, she was found to have a laryngeal mass but did not follow-up on that until 2019.  At that time she was found to have stage IV laryngeal cancer.  She was taken to the operating room and had resection of the tumor and a tracheostomy placed at that time.  Since then she has been decannulated but the stoma has remained in place.  She was recently hospitalized from 12 August to 24 August and then from 26 August-31 August.  She was given a course of broad-spectrum antibiotics for right lower lobe infiltrate.   There is also a new concern for COPD and was put on Solu-Medrol at that time.  She been discharged home and on 16 September she presented back to the emergency room complaining of diarrhea and shortness of breath.  She been discharged to home with home oxygen.  9/17 she presented back to the emergency room with increasing shortness of breath, and productive cough.  Patient was found to be significantly hypoxic on arrival by EMS.  Past Medical History  Stroke Right leg DVT HTN HLD Laryngeal cancer AKI  Significant Hospital Events   Admitted 9/17  Consults:    Procedures:    Significant Diagnostic Tests:  CXR 9/19 > bibasilar atelectasis and suspected right pleural effusion   Micro Data:  COVID 9/17 > Negative  MRSA PCR 9/17 > Negative  Respiratory culture 9/17 >Moderate staph aureus, MSSA. R to erythro, clinda Blood culture 9/17 > negative  Antimicrobials:  Cefepime 9/17 > 11/14/2019 Ancef 11/14/2019>>  Interim history/subjective:  Currently has a 6 cuffless trach in place.   She is having some issues with secretions today Objective   Blood pressure (!) 154/69, pulse 76, temperature 97.8 F (36.6 C), temperature source Oral, resp. rate 16, height 4\' 11"  (1.499 m), weight 62.9 kg, SpO2 98 %.    FiO2 (%):  [28 %] 28 %   Intake/Output Summary (Last 24 hours) at 11/16/2019 0944 Last data filed at 11/16/2019 0856 Gross per 24 hour  Intake --  Output 900 ml  Net -900 ml   Filed Weights   11/14/19 0500 11/15/19 0313 11/16/19 0600  Weight: 65.4 kg 65.6 kg 62.9 kg    Examination:  General: 69 Frail elderly female who is in mild respiratory distress with copious HEENT: 6 cuffless trach is in place secretions requiring suctioning Neuro: Grossly intact CV:  PULM: Coarse rhonchi bilaterally  GI: soft, bsx4 active  Extremities: warm/dry, negative edema  Skin: no rashes or lesions     Resolved Hospital Problem list     Assessment & Plan:  Acute on chronic hypoxemic respiratory failure due to MSSA pneumonia She is s/p laryngectomy so she should not be aspirating food despite coughing P:  On Ancef since 11/14/2019 Currently on trach collar with copious secretions therefore would not decannulate anytime soon She is been changed to a #6 cuffless with not getting smaller due to secretions Consider leaving tracheostomy in place until she follow-[facility] at [facility] Pulmonary critical care will follow twice weekly      Baseline anxiety, need for  sedation with mechanical ventilation P: PRN xanax per home regimen  Hx largyngeal cancer post laryngectomy; pT3pN0 supraglottic carcinoma - Was followed by Dr. Eudelia Bunch [facility] P: She will need to follow-up with Dr. Owens Shark as an outpatient  Deconditioning present on admission P: Per primary  HTN Per primary  Hx of PE P: Currently on full dose Lovenox Consider transition to oral agent  Prior CVA with R sided weakness P: Per primary Best practice:  Diet: trial of diet (s/p laryngectomy  so should be able to vent + eat) Pain/Anxiety/Delirium protocol (if indicated):NA VAP protocol (if indicated): NA DVT prophylaxis: Full dose lovenox GI prophylaxis: Protonix Glucose control:  Mobility: Bedrest Code Status: Full Family Communication: Pt updated at bedside Disposition: [place] Nurse Practitioner [place] Please consult [place] 11/16/2019, 9:44 AM

## 2019-11-16 NOTE — Plan of Care (Signed)
  Problem: Education: Goal: Knowledge of General Education information will improve Description Including pain rating scale, medication(s)/side effects and non-pharmacologic comfort measures Outcome: Progressing   

## 2019-11-16 NOTE — Progress Notes (Signed)
Physical Therapy Treatment Patient Details Name: Katelyn [name] MRN: [id number] DOB: Jul 03, 1950 Today's Date: 11/16/2019    History of Present Illness Katelyn [name] is a 69 y.o. female with medical history significant of laryngeal cancer s/p laryngectomy, tracheostomy, gallstone pancreatitis, history of right hip fracture, hyperlipidemia, hypertension, right lower extremity DVT, history of Klebsiella septic shock, history of other nonhemorrhagic CVA with right-sided weakness who was discharged 10/18/19 after a 12-day hospitalization due to respiratory failure secondary to COPD exacerbation, bilateral pneumonia and aspiration pneumonia.  She returns to the emergency department after complaining of exacerbation of dyspnea twice overnight requiring each time to be seen by EMS.     PT Comments    Pt supine in bed this session.  Agreeable to PT session but as mobility progressed she fatigued quickly.  Pt required min to mod assistance.  Based on medical necessity and decrease strength and endurance she would benefit from [facility] placement.  Informed supervising PT of need for updated recommendations.  Plan for continues acute PT during hospital stay.      Follow Up Recommendations  LTACH     Equipment Recommendations   (TBD)    Recommendations for Other Services       Precautions / Restrictions Precautions Precautions: Fall Precaution Comments: Trach Restrictions Weight Bearing Restrictions: No    Mobility  Bed Mobility Overal bed mobility: Needs Assistance Bed Mobility: Supine to Sit;Sit to Supine     Supine to sit: Min assist Sit to supine: Mod assist   General bed mobility comments: Min assistance to elevate trunk into sitting. Mod assist to return LEs back to bed.  Pt continues to require assistance + total to boost to [facility] as she cannot tolerate lying flat.  Transfers Overall transfer level: Needs assistance Equipment used: Rolling walker (2 wheeled) Transfers: Sit  to/from Stand Sit to Stand: Min assist Stand pivot transfers: Min assist       General transfer comment: Min assistance to rise into standing.  Noted with flexed hips and trunk.  Ambulation/Gait Ambulation/Gait assistance: Mod assist Gait Distance (Feet): 4 Feet (x2 away from back and back to bed with retro gt.) Assistive device: Rolling walker (2 wheeled) Gait Pattern/deviations: Step-to pattern;Shuffle;Trunk flexed;Narrow base of support Gait velocity: decreased   General Gait Details: Pt fatigues quickly with a few steps away from bed and required mod assistance and backing slowly to return to seated surface.   Stairs             Wheelchair Mobility    Modified Rankin (Stroke Patients Only)       Balance Overall balance assessment: Needs assistance Sitting-balance support: Bilateral upper extremity supported;Feet unsupported Sitting balance-Leahy Scale: Fair       Standing balance-Leahy Scale: Poor Standing balance comment: reliant on RW for support, unsteady steps when walking and hard drift right                            Cognition Arousal/Alertness: Awake/alert Behavior During Therapy: WFL for tasks assessed/performed Overall Cognitive Status: Difficult to assess                                 General Comments: non verbal but appropriate responses and answers by mouthing words. Appears [facility]      Exercises      General Comments        Pertinent Vitals/Pain Pain Assessment: No/denies pain  Home Living                      Prior Function            PT Goals (current goals can now be found in the care plan section) Acute Rehab PT Goals Patient Stated Goal: none stated Potential to Achieve Goals: Fair Progress towards PT goals: Progressing toward goals    Frequency    Min 3X/week      PT Plan Discharge plan needs to be updated    Co-evaluation              AM-PAC PT "6 Clicks" Mobility    Outcome Measure  Help needed turning from your back to your side while in a flat bed without using bedrails?: A Little Help needed moving from lying on your back to sitting on the side of a flat bed without using bedrails?: A Little Help needed moving to and from a bed to a chair (including a wheelchair)?: A Lot Help needed standing up from a chair using your arms (e.g., wheelchair or bedside chair)?: A Lot Help needed to walk in hospital room?: A Lot Help needed climbing 3-5 steps with a railing? : Total 6 Click Score: 13    End of Session Equipment Utilized During Treatment: Oxygen Activity Tolerance: Patient tolerated treatment well Patient left: in bed;with call bell/phone within reach;with nursing/sitter in room Nurse Communication: Mobility status PT Visit Diagnosis: Muscle weakness (generalized) (M62.81);Other abnormalities of gait and mobility (R26.89)     Time: 1202-1225 PT Time Calculation (min) (ACUTE ONLY): 23 min  Charges:  $Gait Training: 8-22 mins $Therapeutic Activity: 8-22 mins                     Erasmo Leventhal , PTA Acute Rehabilitation Services Pager [phone] Office [phone]     Makaylyn Sinyard Eli Hose 11/16/2019, 3:54 PM

## 2019-11-16 NOTE — Consult Note (Signed)
   [facility] Inpatient Consult   11/16/2019  Katelyn [name] [birth year] [id number]   [place] [facility] [ACO] Patient: Medicare NextGen  Primary Care Provider is Milagros Evener, MD   Patient screened for extreme high risk score for unplanned readmission score and for less than 30 days readmission hospitalization.  Medical record reviewed to check if potential [facility] service needs.  Review of patient's medical record reveals patient is being recommended for a LTACH level of care at transition.   Plan:  Will follow and sign off if  transitions to [facility].  For questions contact:   Natividad Brood, RN BSN [facility] Liaison  [phone] business mobile phone Toll free office [phone]  Fax number: [phone] [email or web address]

## 2019-11-16 NOTE — Progress Notes (Signed)
PROGRESS NOTE    Katelyn [name]  WRU:[id number] DOB: [birth year] DOA: 11/11/2019 PCP: Aretta Nip, MD   Brief Narrative:  This is a 69 year old black female that presented to the emergency room from home.  She has an extensive medical history.  In 2015, she was found to have a laryngeal mass but did not follow-up on that until 2019.  At that time she was found to have stage IV laryngeal cancer.  She was taken to the operating room and had resection of the tumor and a tracheostomy placed at that time.  Since then she has been decannulated but the stoma has remained in place.  She was recently hospitalized from 12 August to 24 August and then from 26 August-31 August.  She was given a course of broad-spectrum antibiotics for right lower lobe infiltrate. There is also a new concern for COPD and was put on Solu-Medrol at that time.  She been discharged home and on 16 September she presented back to the emergency room complaining of diarrhea and shortness of breath. She been discharged to home with home oxygen.  9/17 she presented back to the emergency room with increasing shortness of breath, and productive cough.  Patient was found to be significantly hypoxic on arrival by EMS.   Assessment & Plan:   Active Problems:   Acute respiratory failure requiring reintubation (HCC)   Acute on chronic respiratory failure with hypoxia and hypercapnia (HCC)   Acute on chronic hypoxemic respiratory failure due to MSSA pneumonia - Hx of laryngectomy so she should not be aspirating food despite coughing - likely related to copious secretions - Continue Ancef; DC Cefepime, vancomycin - Pulm suspects she will decannulate quickly  Anxiety - somewhat poorly controlled - PRN xanax per home regimen - Improving now off the ventilator  Hx largyngeal cancer post laryngectomy; T3-N0 supraglottic carcinoma - Followed by Dr. Eudelia Bunch [facility] - Will need to re-establish care with Dr. Marylene Land as  above possibly to be decanulated per PCCM/Pulm  Deconditioning/Protein caloric malnutrition/ambulatory dysfunction present on admission - PT/OT evals - Mobilize as able  - Optimize nutrition   HTN - Continue home metoprolol  Hx of PE - Continue full dose Lovenox  - Holding home eliquis for now - likely transition back at discharge  Prior CVA with R sided weakness, stable Supportive care Therapies as above    DVT prophylaxis: Lovenox Code Status: Full Family Communication: At bedside  Status is: inpt  Dispo: The patient is from: Home              Anticipated d/c is to: TBD - likely LTAC              Anticipated d/c date is: 24-48h pending clinical course              Patient currently NOT medically stable for discharge due to ongoing evaluation and work-up with pulmonology, need to transition to safe disposition to continue IV antibiotics and close monitoring in the setting of hypoxia and recent MSSA pneumonia  Consultants:   PCCM  Antimicrobials:  Ancef  Subjective: No acute issues or events overnight, patient feels well; her only complaint today is that her food is cold and she would prefer something different to eat and is somewhat hungry but otherwise denies chest pain, nausea, vomiting, diarrhea, constipation, headache, fevers, chills.  Objective: Vitals:   11/16/19 0545 11/16/19 0600 11/16/19 0729 11/16/19 1123  BP: (!) 154/69     Pulse: 64  76 83  Resp: 17  16 16   Temp: 97.8 F (36.6 C)     TempSrc: Oral     SpO2: 98%  98% 98%  Weight:  62.9 kg    Height:        Intake/Output Summary (Last 24 hours) at 11/16/2019 1423 Last data filed at 11/16/2019 0856 Gross per 24 hour  Intake --  Output 600 ml  Net -600 ml   Filed Weights   11/14/19 0500 11/15/19 0313 11/16/19 0600  Weight: 65.4 kg 65.6 kg 62.9 kg    Examination:  General: Cachectic frail-appearing elderly female no acute distress resting comfortably in bed HEENT: Tracheostomy in place  with copious secretions Lungs: Coarse bilateral rhonchi likely reverberating from tracheostomy, without acute wheeze or rales Heart:  Regular rate and rhythm.  Without murmurs, rubs, or gallops. Abdomen:  Soft, nontender, nondistended.  Without guarding or rebound. Extremities: Without cyanosis, clubbing, edema, or obvious deformity. Vascular:  Dorsalis pedis and posterior tibial pulses palpable bilaterally. Skin:  Warm and dry, no erythema, no ulcerations.   Data Reviewed: I have personally reviewed following labs and imaging studies  CBC: Recent Labs  Lab 11/11/19 0154 11/11/19 0247 11/12/19 0203 11/13/19 0153 11/14/19 0233 11/15/19 0123 11/16/19 1149  WBC 10.4   < > 9.8 6.8 5.1 5.7 4.6  NEUTROABS 4.4  --   --   --   --   --  2.4  HGB 11.2*   < > 10.2* 8.7* 9.0* 9.3* 8.9*  HCT 38.9   < > 33.7* 28.4* 30.6* 30.9* 30.8*  MCV 101.3*   < > 96.6 97.6 98.4 96.9 98.7  PLT 191   < > 182 211 178 179 191   < > = values in this interval not displayed.   Basic Metabolic Panel: Recent Labs  Lab 11/12/19 0203 11/12/19 0203 11/13/19 0153 11/14/19 0233 11/14/19 1432 11/15/19 0123 11/16/19 1149  NA 140   < > 137 145 146* 142 142  K 4.2   < > 5.4* 3.2* 3.5 3.2* 4.0  CL 106   < > 107 106 105 102 103  CO2 24   < > 24 31 31 31  32  GLUCOSE 119*   < > 103* 100* 114* 88 109*  BUN 23   < > 25* 13 11 11 13   CREATININE 1.18*   < > 1.12* 1.03* 1.04* 1.02* 0.89  CALCIUM 8.8*   < > 8.1* 8.8* 9.0 8.7* 8.8*  MG 1.3*  --  2.9* 1.8  --  2.0 1.9  PHOS 3.2  --  1.6* 3.6  --  2.8 1.8*   < > = values in this interval not displayed.   GFR: Estimated Creatinine Clearance: 47.4 mL/min (by C-G formula based on SCr of 0.89 mg/dL). Liver Function Tests: Recent Labs  Lab 11/11/19 0154  AST 32  ALT 26  ALKPHOS 97  BILITOT 0.8  PROT 6.9  ALBUMIN 3.4*   No results for input(s): LIPASE, AMYLASE in the last 168 hours. No results for input(s): AMMONIA in the last 168 hours. Coagulation  Profile: Recent Labs  Lab 11/11/19 0154  INR 1.2   Cardiac Enzymes: No results for input(s): CKTOTAL, CKMB, CKMBINDEX, TROPONINI in the last 168 hours. BNP (last 3 results) No results for input(s): PROBNP in the last 8760 hours. HbA1C: No results for input(s): HGBA1C in the last 72 hours. CBG: Recent Labs  Lab 11/11/19 0922 11/11/19 1635 11/11/19 1948 11/12/19 1948 11/13/19 2002  GLUCAP 84 134*  129* 151* 114*   Lipid Profile: No results for input(s): CHOL, HDL, LDLCALC, TRIG, CHOLHDL, LDLDIRECT in the last 72 hours. Thyroid Function Tests: No results for input(s): TSH, T4TOTAL, FREET4, T3FREE, THYROIDAB in the last 72 hours. Anemia Panel: No results for input(s): VITAMINB12, FOLATE, FERRITIN, TIBC, IRON, RETICCTPCT in the last 72 hours. Sepsis Labs: Recent Labs  Lab 11/11/19 0154 11/11/19 0500 11/11/19 0947  PROCALCITON  --   --  0.80  LATICACIDVEN 3.5* 2.0*  --     Recent Results (from the past 240 hour(s))  SARS Coronavirus 2 by RT PCR (hospital order, performed in [facility] hospital lab) Nasopharyngeal Nasopharyngeal Swab     Status: None   Collection Time: 11/11/19  1:50 AM   Specimen: Nasopharyngeal Swab  Result Value Ref Range Status   SARS Coronavirus 2 NEGATIVE NEGATIVE Final    Comment: (NOTE) SARS-CoV-2 target nucleic acids are NOT DETECTED.  The SARS-CoV-2 RNA is generally detectable in upper and lower respiratory specimens during the acute phase of infection. The lowest concentration of SARS-CoV-2 viral copies this assay can detect is 250 copies / mL. A negative result does not preclude SARS-CoV-2 infection and should not be used as the sole basis for treatment or other patient management decisions.  A negative result may occur with improper specimen collection / handling, submission of specimen other than nasopharyngeal swab, presence of viral mutation(s) within the areas targeted by this assay, and inadequate number of viral copies (<250 copies  / mL). A negative result must be combined with clinical observations, patient history, and epidemiological information.  Fact Sheet for Patients:   StrictlyIdeas.no  Fact Sheet for Healthcare Providers: BankingDealers.co.za  This test is not yet approved or  cleared by the [place] FDA and has been authorized for detection and/or diagnosis of SARS-CoV-2 by FDA under an Emergency Use Authorization (EUA).  This EUA will remain in effect (meaning this test can be used) for the duration of the COVID-19 declaration under Section 564(b)(1) of the Act, 21 U.S.C. section 360bbb-3(b)(1), unless the authorization is terminated or revoked sooner.  Performed at [facility], [place] [address] [place], [place] [address]   Culture, blood (Routine x 2)     Status: None   Collection Time: 11/11/19  1:54 AM   Specimen: BLOOD  Result Value Ref Range Status   Specimen Description BLOOD SITE NOT SPECIFIED  Final   Special Requests   Final    BOTTLES DRAWN AEROBIC AND ANAEROBIC Blood Culture adequate volume   Culture   Final    NO GROWTH 5 DAYS Performed at [facility], [place] [address], [place], [place] [address]    Report Status 11/16/2019 FINAL  Final  Culture, blood (Routine x 2)     Status: None   Collection Time: 11/11/19  5:15 AM   Specimen: BLOOD  Result Value Ref Range Status   Specimen Description BLOOD LEFT ARM  Final   Special Requests   Final    BOTTLES DRAWN AEROBIC AND ANAEROBIC Blood Culture adequate volume   Culture   Final    NO GROWTH 5 DAYS Performed at [facility], [place] [address], [place], [place] [address]    Report Status 11/16/2019 FINAL  Final  MRSA PCR Screening     Status: None   Collection Time: 11/11/19 10:32 AM   Specimen: Nasal Mucosa; Nasopharyngeal  Result Value Ref Range Status   MRSA by PCR NEGATIVE NEGATIVE Final    Comment:  The GeneXpert MRSA Assay (FDA approved for NASAL  specimens only), is one component of a comprehensive MRSA colonization surveillance program. It is not intended to diagnose MRSA infection nor to guide or monitor treatment for MRSA infections. Performed at [facility], [place] [address] [place], [place] [address]   Culture, respiratory (non-expectorated)     Status: None   Collection Time: 11/11/19 11:33 AM   Specimen: Tracheal Aspirate; Respiratory  Result Value Ref Range Status   Specimen Description TRACHEAL ASPIRATE  Final   Special Requests NONE  Final   Gram Stain   Final    ABUNDANT WBC PRESENT,BOTH PMN AND MONONUCLEAR MODERATE GRAM POSITIVE COCCI Performed at [facility], [place] [address], [place], [place] [address]    Culture MODERATE STAPHYLOCOCCUS AUREUS  Final   Report Status 11/13/2019 FINAL  Final   Organism ID, Bacteria STAPHYLOCOCCUS AUREUS  Final      Susceptibility   Staphylococcus aureus - MIC*    CIPROFLOXACIN <=0.5 SENSITIVE Sensitive     ERYTHROMYCIN >=8 RESISTANT Resistant     GENTAMICIN <=0.5 SENSITIVE Sensitive     OXACILLIN <=0.25 SENSITIVE Sensitive     TETRACYCLINE <=1 SENSITIVE Sensitive     VANCOMYCIN 1 SENSITIVE Sensitive     TRIMETH/SULFA <=10 SENSITIVE Sensitive     CLINDAMYCIN RESISTANT Resistant     RIFAMPIN <=0.5 SENSITIVE Sensitive     Inducible Clindamycin POSITIVE Resistant     * MODERATE STAPHYLOCOCCUS AUREUS         Radiology Studies: DG Chest Port 1 View  Result Date: 11/16/2019 CLINICAL DATA:  Abnormal respiration. History of laryngeal carcinoma. EXAM: PORTABLE CHEST 1 VIEW COMPARISON:  November 12, 2019. FINDINGS: Stable cardiomediastinal silhouette. Tracheostomy tube is in good position. No pneumothorax is noted. Stable bibasilar subsegmental atelectasis is noted with probable small right pleural effusion. Bony thorax is unremarkable. IMPRESSION: Stable bibasilar subsegmental atelectasis with probable small right pleural effusion. Electronically Signed   By: Marijo Conception M.D.   On: 11/16/2019 08:13        Scheduled Meds: . atorvastatin  80 mg Oral q1800  . chlorhexidine  15 mL Mouth Rinse BID  . Chlorhexidine Gluconate Cloth  6 each Topical Daily  . enoxaparin (LOVENOX) injection  60 mg Subcutaneous Q12H  . mouth rinse  15 mL Mouth Rinse q12n4p  . metoprolol tartrate  25 mg Oral BID   Continuous Infusions: .  ceFAZolin (ANCEF) IV 2 g (11/16/19 1415)     LOS: 5 days   Time spent: 93min  Amariyah Bazar C Kaitelyn Jamison, DO [facility]  If 7PM-7AM, please contact night-coverage [email or web address]  11/16/2019, 2:23 PM

## 2019-11-16 NOTE — Care Management Important Message (Signed)
Important Message  Patient Details  Name: Katelyn [name] MRN: [id number] Date of Birth: [birth year]   Medicare Important Message Given:  Yes     Dolph Tavano 11/16/2019, 2:05 PM

## 2019-11-17 ENCOUNTER — Other Ambulatory Visit: Payer: Self-pay

## 2019-11-17 ENCOUNTER — Inpatient Hospital Stay (HOSPITAL_COMMUNITY): Payer: Medicare Other

## 2019-11-17 LAB — CBC
HCT: 29.5 % — ABNORMAL LOW (ref 36.0–46.0)
Hemoglobin: 8.6 g/dL — ABNORMAL LOW (ref 12.0–15.0)
MCH: 29 pg (ref 26.0–34.0)
MCHC: 29.2 g/dL — ABNORMAL LOW (ref 30.0–36.0)
MCV: 99.3 fL (ref 80.0–100.0)
Platelets: 192 10*3/uL (ref 150–400)
RBC: 2.97 MIL/uL — ABNORMAL LOW (ref 3.87–5.11)
RDW: 13.6 % (ref 11.5–15.5)
WBC: 5.4 10*3/uL (ref 4.0–10.5)
nRBC: 0 % (ref 0.0–0.2)

## 2019-11-17 LAB — BASIC METABOLIC PANEL
Anion gap: 7 (ref 5–15)
BUN: 14 mg/dL (ref 8–23)
CO2: 29 mmol/L (ref 22–32)
Calcium: 8.7 mg/dL — ABNORMAL LOW (ref 8.9–10.3)
Chloride: 107 mmol/L (ref 98–111)
Creatinine, Ser: 0.84 mg/dL (ref 0.44–1.00)
GFR calc Af Amer: >60 mL/min (ref >60)
GFR calc non Af Amer: >60 mL/min (ref >60)
Glucose, Bld: 108 mg/dL — ABNORMAL HIGH (ref 70–99)
Potassium: 4 mmol/L (ref 3.5–5.1)
Sodium: 143 mmol/L (ref 135–145)

## 2019-11-17 LAB — PHOSPHORUS: Phosphorus: 2.4 mg/dL — ABNORMAL LOW (ref 2.5–4.6)

## 2019-11-17 LAB — MAGNESIUM: Magnesium: 1.8 mg/dL (ref 1.7–2.4)

## 2019-11-17 MED ORDER — APIXABAN 5 MG PO TABS
5.0000 mg | ORAL_TABLET | Freq: Two times a day (BID) | ORAL | Status: DC
  Administered 2019-11-17 – 2019-11-21 (×8): 5 mg via ORAL
  Filled 2019-11-17 (×9): qty 1

## 2019-11-17 NOTE — Progress Notes (Signed)
ANTICOAGULATION CONSULT NOTE  Pharmacy Consult for Lovenox Indication: History of a DVT  No Known Allergies  Patient Measurements: Height: 4\' 11"  (149.9 cm) Weight: 62.9 kg (138 lb 10.7 oz) IBW/kg (Calculated) : 43.2  Vital Signs: Temp: 97.9 F (36.6 C) (09/23 0543) Temp Source: Oral (09/23 0543) BP: 153/82 (09/23 0543) Pulse Rate: 63 (09/23 0543)  Labs: Recent Labs    11/15/19 0123 11/15/19 0123 11/16/19 1149 11/17/19 0227  HGB 9.3*   < > 8.9* 8.6*  HCT 30.9*  --  30.8* 29.5*  PLT 179  --  191 192  CREATININE 1.02*  --  0.89 0.84   < > = values in this interval not displayed.    Estimated Creatinine Clearance: 50.3 mL/min (by C-G formula based on SCr of 0.84 mg/dL).   Assessment: 69 years of age female on apixaban therapy prior to admission for history of DVT and CVA.  Patient was transitioned to Lovenox due to NPO status on admit and no enteral access.  Now with plan to decannulate trach.    Renal function stable; no bleeding reported.  Goal of Therapy:  Anti-Xa level 0.6-1 units/ml 4hrs after LMWH dose given  Monitor platelets by anticoagulation protocol: Yes   Plan:  Continue Lovenox 60mg  SQ Q12H Noted plan to resume Eliquis at discharge Pharmacy will sign off and follow peripherally.  Thank you for the consult!  Lanson Randle D. Mina Marble, PharmD, BCPS, [place] 11/17/2019, 8:03 AM

## 2019-11-17 NOTE — TOC Progression Note (Signed)
Transition of Care [facility]) - Progression Note    Patient Details  Name: Hannia Matchett MRN: [id number] Date of Birth: 11-30-1950  Transition of Care [facility]) CM/SW [place], RN Phone Number: 11/17/2019, 2:36 PM  Clinical Narrative:    [name], patient daughter called and wanted to know the plan, as she had not heard from a MD . She is concerned that noone had spoken to the patent about plans for [facility]. Passed on to [employer]. And sent message to MD.     Expected Discharge Plan: Long Term Acute Care (LTAC) Barriers to Discharge: No Barriers Identified  Expected Discharge Plan and Services Expected Discharge Plan: [place] (LTAC)         Expected Discharge Date: 11/16/19                                     Social Determinants of Health (SDOH) Interventions    Readmission Risk Interventions No flowsheet data found.

## 2019-11-17 NOTE — TOC Progression Note (Signed)
Transition of Care [facility]) - Progression Note    Patient Details  Name: Katelyn [name] MRN: [id number] Date of Birth: 04-10-1950  Transition of Care [facility]) CM/SW [place], [place] Phone Number: 11/17/2019, 4:37 PM  Clinical Narrative:    CSW spoke with [name] after RN confirmed that was the primary contact for pt. I discussed w/ Earnestine that pt has clinically perhaps progressed past LTAC from the clinical perspective of LTAC staff. We discussed that they think that her needs could be met at a SNF. [name] gives permission for referrals to be sent to SNFs and would like to have a family call with pt to see what their options are and what pt would like to do. I explained that pt is clinically cleared and that we would need to do this as soon as possible. Pt daughter states understanding. CSW sent referrals out on hub and secure fax specifically to [facility] as they accept trach pts. Pt only partially vaccinated along with trach. This will pose multiple barriers- pt daughter aware as they have experienced that before. Pt daughter has my contact information to f/u with me and I will f/u with pt tomorrow with any options- to discuss next steps.   Expected Discharge Plan: Long Term Acute Care (LTAC) Barriers to Discharge: No Barriers Identified  Expected Discharge Plan and Services Expected Discharge Plan: Long Term Acute Care (LTAC) vs SNF Expected Discharge Date: 11/16/19                 Readmission Risk Interventions No flowsheet data found.

## 2019-11-17 NOTE — Plan of Care (Signed)
  Problem: Education: Goal: Knowledge of General Education information will improve Description Including pain rating scale, medication(s)/side effects and non-pharmacologic comfort measures Outcome: Progressing   

## 2019-11-17 NOTE — NC FL2 (Signed)
[place] LEVEL OF CARE SCREENING TOOL     IDENTIFICATION  Patient Name: Katelyn [name] Birthdate: October 02, 1950 Sex: female Admission Date (Current Location): 11/11/2019  [facility] and [place] Number:  Herbalist and Address:  [facility], [place] [address] [place], [name] [address]      Provider Number: [id number]  Attending Physician Name and Address:  Little Ishikawa, MD  Relative Name and Phone Number:       Current Level of Care: Hospital Recommended Level of Care: [place] Prior Approval Number:    Date Approved/Denied:   PASRR Number: [id number]  Discharge Plan: SNF    Current Diagnoses: Patient Active Problem List   Diagnosis Date Noted  . Acute on chronic respiratory failure with hypoxia and hypercapnia (HCC)   . Acute respiratory failure requiring reintubation ([place]) 11/11/2019  . Loose stools 11/06/2019  . Benign essential HTN   . Chronic obstructive pulmonary disease ([place])   . Debility 10/25/2019  . Acute hypoxemic respiratory failure ([place]) 10/21/2019  . Goals of care, counseling/discussion   . Palliative care by specialist   . History of noncompliance with medical treatment   . COPD exacerbation ([place]) 10/06/2019  . Generalized weakness 07/12/2017  . Palliative care encounter 07/12/2017  . 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 Pressure injury of skin 05/30/2017  . Malnutrition of moderate degree 05/30/2017  . Hip fx, right, closed, initial encounter ([place]) 05/29/2017  . Closed displaced intertrochanteric fracture of right femur ([place]) 05/29/2017  . Dysphagia 05/29/2017  . Weight loss 05/29/2017  . Difficult airway for intubation 05/29/2017  . Lesion of epiglottis 05/26/2014  . Abnormal CT scan, pancreas or bile duct   . Right leg DVT ([place]) 05/08/2014  . Anemia 01/09/2014  . Hip pain, right 05/06/2011  . 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 CKD (chronic kidney  disease) stage 3, GFR 30-59 ml/min (HCC) 03/04/2011  . Diastolic dysfunction 35/70/1779  . Tobacco abuse 03/04/2011  . Constipation 03/04/2011  . Malignant hypertensive urgency 03/01/2011  . Cerebral infarction ([place]) 03/01/2011  . NEUROPATHY, IDIOPATHIC 10/25/2007  . Hypercholesteremia 12/15/2006  . Essential hypertension 12/15/2006    Orientation RESPIRATION BLADDER Height & Weight     Self, Time, Place, Situation  O2, Tracheostomy (66mm cuffless shilley; 5L @ 28%) Continent Weight: 138 lb 10.7 oz (62.9 kg) Height:  4\' 11"  (149.9 cm)  BEHAVIORAL SYMPTOMS/MOOD NEUROLOGICAL BOWEL NUTRITION STATUS      Continent Diet (see discharge summary)  AMBULATORY STATUS COMMUNICATION OF NEEDS Skin   Extensive Assist Verbally Normal                       Personal Care Assistance Level of Assistance  Bathing, Feeding, Dressing Bathing Assistance: Maximum assistance Feeding assistance: Independent Dressing Assistance: Maximum assistance     Functional Limitations Info  Sight, Hearing, Speech Sight Info: Adequate Hearing Info: Adequate Speech Info: Impaired    SPECIAL CARE FACTORS FREQUENCY  PT (By licensed PT), OT (By licensed OT)     PT Frequency: 5x week OT Frequency: 5x week            Contractures Contractures Info: Not present    Additional Factors Info  Code Status, Allergies Code Status Info: Full Code Allergies Info: No Known Allergies           Current Medications (11/17/2019):  This is the current hospital active medication list Current Facility-Administered Medications  Medication Dose Route Frequency  Provider Last Rate Last Admin  . ALPRAZolam Duanne Moron) tablet 0.5 mg  0.5 mg Oral TID PRN Corey Harold, NP   0.5 mg at 11/16/19 2126  . apixaban (ELIQUIS) tablet 5 mg  5 mg Oral BID Little Ishikawa, MD      . atorvastatin (LIPITOR) tablet 80 mg  80 mg Oral q1800 Deland Pretty, MD   80 mg at 11/16/19 1643  . ceFAZolin (ANCEF) IVPB 2g/100 mL  premix  2 g Intravenous Q8H Little Ishikawa, MD   Stopped at 11/17/19 1400  . chlorhexidine (PERIDEX) 0.12 % solution 15 mL  15 mL Mouth Rinse BID Candee Furbish, MD   15 mL at 11/17/19 1015  . Chlorhexidine Gluconate Cloth 2 % PADS 6 each  6 each Topical Daily Anders Simmonds, MD   6 each at 11/17/19 1016  . docusate sodium (COLACE) capsule 100 mg  100 mg Oral BID PRN Deland Pretty, MD      . hydrALAZINE (APRESOLINE) injection 10 mg  10 mg Intravenous Q4H PRN Anders Simmonds, MD   10 mg at 11/14/19 0631  . MEDLINE mouth rinse  15 mL Mouth Rinse q12n4p Candee Furbish, MD   15 mL at 11/17/19 1540  . metoprolol tartrate (LOPRESSOR) injection 5 mg  5 mg Intravenous Q6H PRN Olalere, Adewale A, MD   5 mg at 11/14/19 0045  . metoprolol tartrate (LOPRESSOR) tablet 25 mg  25 mg Oral BID Corey Harold, NP   25 mg at 11/17/19 1010  . ondansetron (ZOFRAN) injection 4 mg  4 mg Intravenous Q6H PRN Deland Pretty, MD      . polyethylene glycol (MIRALAX / GLYCOLAX) packet 17 g  17 g Oral Daily PRN Deland Pretty, MD         Discharge Medications: Please see discharge summary for a list of discharge medications.  Relevant Imaging Results:  Relevant Lab Results:   Additional Information SS# [id number]  Alexander [place], LCSW

## 2019-11-17 NOTE — Discharge Summary (Signed)
Physician Discharge Summary  Charne Mcbrien GOT:[id number] DOB: [birth year] DOA: 11/11/2019  PCP: Aretta Nip, MD  Admit date: 11/11/2019 Discharge date: 11/17/2019  Admitted From: Home Disposition: LTAC  Recommendations for Outpatient Follow-up:  1. Follow up with PCP in 1-2 weeks; pulmonology as scheduled  Discharge Condition: Stable CODE STATUS: Full Diet recommendation: Soft diet as tolerated  Brief/Interim Summary: This is a 69 year old black female that presented to the emergency room from home. She has an extensive medical history. In 2015, she was found to have a laryngeal mass but did not follow-up on that until 2019. At that time she was found to have stage IV laryngeal cancer. She was taken to the operating room and had resection of the tumor and a tracheostomy placed at that time. Since then she has been decannulated but the stoma has remained in place. She was recently hospitalized from 12 August to 24 August and then from 26 August-31 August. She was given a course of broad-spectrum antibiotics for right lower lobe infiltrate. There is also a new concern for COPD and was put on Solu-Medrol at that time. She been discharged home and on 16 September she presented back to the emergency room complaining of diarrhea and shortness of breath. She been discharged to home with home oxygen. 9/17 she presented back to the emergency room with increasing shortness of breath, and productive cough. Patient was found to be significantly hypoxic on arrival by EMS.  Patient had notable acute hypoxic respiratory failure on chronic respiratory failure in the setting of MSSA pneumonia with history of laryngectomy and tracheostomy.  Pulmonology initially admitted patient to their service in the ICU and was placed on a ventilator but extubated quickly.  She currently is on trach collar supplemental oxygen and doing quite well.  Her other chronic comorbid conditions including laryngeal  cancer, severe protein caloric malnutrition, hypertension history of PE and CVA appear to be essentially stable.  At this time she continues to resolve from her MSSA pneumonia on Ancef to complete today on the 23rd.  Per discussion with PCCM and patient we have arranged for patient to transition to LTAC, pending bed assignment and availability.  Patient otherwise stable and agreeable for discharge, close follow-up in the outpatient setting with PCP and pulmonology for further discussion about decannulating as scheduled.Marland Kitchen  Discharge Diagnoses:  Active Problems:   Acute respiratory failure requiring reintubation (HCC)   Acute on chronic respiratory failure with hypoxia and hypercapnia [facility])    Discharge Instructions  Discharge Instructions    Call MD for:  difficulty breathing, headache or visual disturbances   Complete by: As directed    Call MD for:  difficulty breathing, headache or visual disturbances   Complete by: As directed    Call MD for:  temperature >100.4   Complete by: As directed    Call MD for:  temperature >100.4   Complete by: As directed    Diet - low sodium heart healthy   Complete by: As directed    Increase activity slowly   Complete by: As directed    Increase activity slowly   Complete by: As directed      Allergies as of 11/17/2019   No Known Allergies     Medication List    TAKE these medications   ALPRAZolam 0.5 MG tablet Commonly known as: XANAX Take 1 tablet (0.5 mg total) by mouth 3 (three) times daily as needed for anxiety.   apixaban 5 MG Tabs tablet Commonly known as: ELIQUIS  Take 1 tablet (5 mg total) by mouth 2 (two) times daily.   atorvastatin 80 MG tablet Commonly known as: LIPITOR Take 1 tablet (80 mg total) by mouth daily. What changed: when to take this   cephALEXin 500 MG capsule Commonly known as: KEFLEX Take 1 capsule (500 mg total) by mouth 4 (four) times daily for 2 days.   guaiFENesin-dextromethorphan 100-10 MG/5ML  syrup Commonly known as: ROBITUSSIN DM Take 10 mLs by mouth every 6 (six) hours.   melatonin 3 MG Tabs tablet Take 1 tablet (3 mg total) by mouth at bedtime as needed (insomnia).   metoprolol tartrate 25 MG tablet Commonly known as: LOPRESSOR Take 1 tablet (25 mg total) by mouth 2 (two) times daily.   nystatin cream Commonly known as: MYCOSTATIN Apply 1 application topically 2 (two) times daily.   polycarbophil 625 MG tablet Commonly known as: FIBERCON Take 1 tablet (625 mg total) by mouth daily.       No Known Allergies  Consultations:  PCCM   Procedures/Studies: DG Chest Port 1 View  Result Date: 11/17/2019 CLINICAL DATA:  Abnormal respirations. EXAM: PORTABLE CHEST 1 VIEW COMPARISON:  11/16/2019. FINDINGS: Tracheostomy tube in stable position. Heart size stable. Persistent bibasilar atelectasis/mild infiltrates. Small right pleural effusion again noted. No pneumothorax. Surgical clips are noted the neck. No acute bony abnormality identified. IMPRESSION: 1.  Tracheostomy tube in stable position. 2. Persistent bibasilar atelectasis/mild infiltrates. Small right pleural effusion again noted. Similar findings on prior exam. Electronically Signed   By: [place]   On: 11/17/2019 06:04   DG Chest Port 1 View  Result Date: 11/16/2019 CLINICAL DATA:  Abnormal respiration. History of laryngeal carcinoma. EXAM: PORTABLE CHEST 1 VIEW COMPARISON:  November 12, 2019. FINDINGS: Stable cardiomediastinal silhouette. Tracheostomy tube is in good position. No pneumothorax is noted. Stable bibasilar subsegmental atelectasis is noted with probable small right pleural effusion. Bony thorax is unremarkable. IMPRESSION: Stable bibasilar subsegmental atelectasis with probable small right pleural effusion. Electronically Signed   By: Marijo Conception M.D.   On: 11/16/2019 08:13   DG Chest Port 1 View  Result Date: 11/12/2019 CLINICAL DATA:  Acute respiratory failure. EXAM: PORTABLE CHEST 1  VIEW COMPARISON:  11/11/2019 FINDINGS: Tracheostomy tube tip is stable above the carina. Heart size appears normal. Suspect small right pleural effusion with veil like opacification of the right lower lobe. Mild subsegmental bibasilar atelectasis. IMPRESSION: 1. Suspect small right pleural effusion. 2. Bibasilar atelectasis. Electronically Signed   By: Kerby Moors M.D.   On: 11/12/2019 09:11   DG Chest Port 1 View  Result Date: 11/11/2019 CLINICAL DATA:  Repositioning of endotracheal tube EXAM: PORTABLE CHEST 1 VIEW COMPARISON:  11/11/2019, 11/10/2019, 10/20/2019 FINDINGS: Tracheostomy tube tip overlies the proximal right mainstem bronchus. Consider retracting by approximately 3 cm. Improved aeration bilaterally with residual ground-glass infiltrates. Normal heart size. No pneumothorax. IMPRESSION: Tracheostomy tube tip overlies the proximal right mainstem bronchus. Consider retracting by approximately 3 cm for more optimal positioning. Improved aeration bilaterally with residual ground-glass infiltrates. Critical Value/emergent results were called by telephone at the time of interpretation on 11/11/2019 at 3:31 am to provider [facility] , who verbally acknowledged these results. Electronically Signed   By: Donavan Foil M.D.   On: 11/11/2019 03:31   DG Chest Portable 1 View  Result Date: 11/11/2019 CLINICAL DATA:  Tracheostomy, shortness of breath, hypoxia EXAM: PORTABLE CHEST 1 VIEW COMPARISON:  11/10/2019 FINDINGS: Single frontal view of the chest demonstrates a tracheostomy tube tip overlying the  right mainstem bronchus. Recommend retracting tracheostomy tube 2.5 cm. Cardiac silhouette is stable. There is developing consolidation within the left lung, which may be hypoventilatory given right mainstem intubation. Nonspecific consolidation at the right lung base could be airspace disease or atelectasis. No effusion or pneumothorax. IMPRESSION: 1. Tracheostomy tube tip overlying right mainstem  bronchus. Recommend retracting 2.5 cm. 2. Likely developing hypoventilatory changes bilaterally. Follow-up imaging after tracheostomy tube adjustment recommended to assess resolution. Critical Value/emergent results were called by telephone at the time of interpretation on 11/11/2019 at 1:51 am to provider [facility] , who verbally acknowledged these results. Electronically Signed   By: Randa Ngo M.D.   On: 11/11/2019 01:51   DG Chest Port 1 View  Result Date: 11/10/2019 CLINICAL DATA:  Shortness of breath. EXAM: PORTABLE CHEST 1 VIEW COMPARISON:  10/20/2019 FINDINGS: The cardiac silhouette, mediastinal and hilar contours are within normal limits and stable. Stable mild tortuosity and calcification of the thoracic aorta. Streaky basilar scarring changes but no infiltrates, edema or effusions. The bony thorax is intact. IMPRESSION: Streaky basilar scarring changes. No acute overlying pulmonary process. Electronically Signed   By: Marijo Sanes M.D.   On: 11/10/2019 07:43   DG Chest Port 1 View  Result Date: 10/20/2019 CLINICAL DATA:  Shortness of breath EXAM: PORTABLE CHEST 1 VIEW COMPARISON:  Three days ago FINDINGS: Streaky density at the lung bases. No Kerley lines, effusion, or pneumothorax. Prominent left ventricular contour but overall normal heart size for portable technique. Aortic tortuosity. Postoperative thoracic inlet. IMPRESSION: Atelectasis or infiltrate at the right base. Electronically Signed   By: Monte Fantasia M.D.   On: 10/20/2019 04:18   ECHOCARDIOGRAM COMPLETE  Result Date: 11/11/2019    ECHOCARDIOGRAM REPORT   Patient Name:   SEMIAH KONCZAL Date of Exam: 11/11/2019 Medical Rec #:  [id number]         Height:       59.0 in Accession #:    [id number]        Weight:       135.4 lb Date of Birth:  15-Jun-1950         BSA:          1.562 m Patient Age:    89 years          BP:           103/72 mmHg Patient Gender: F                 HR:           72 bpm. Exam Location:   Inpatient Procedure: 2D Echo, Color Doppler and Cardiac Doppler Indications:    I50.9* Heart failure (unspecified)  History:        Patient has prior history of Echocardiogram examinations, most                 recent 10/07/2019. COPD; Risk Factors:Hypertension and                 Dyslipidemia.  Sonographer:    Raquel Sarna Senior RDCS Referring Phys: [place]  Sonographer Comments: Scanned supine on artificial respirator. IMPRESSIONS  1. Left ventricular ejection fraction, by estimation, is 65 to 70%. The left ventricle has normal function. The left ventricle has no regional wall motion abnormalities. Left ventricular diastolic parameters are consistent with Grade I diastolic dysfunction (impaired relaxation).  2. Right ventricular systolic function is normal. The right ventricular size is normal. There is normal pulmonary artery systolic pressure. The  estimated right ventricular systolic pressure is 41.9 mmHg.  3. The mitral valve is grossly normal. Trivial mitral valve regurgitation. No evidence of mitral stenosis.  4. The aortic valve is tricuspid. There is moderate calcification of the aortic valve. Aortic valve regurgitation is mild. Mild to moderate aortic valve sclerosis/calcification is present, without any evidence of aortic stenosis.  5. The inferior vena cava is normal in size with greater than 50% respiratory variability, suggesting right atrial pressure of 3 mmHg. Comparison(s): No significant change from prior study. FINDINGS  Left Ventricle: Left ventricular ejection fraction, by estimation, is 65 to 70%. The left ventricle has normal function. The left ventricle has no regional wall motion abnormalities. The left ventricular internal cavity size was normal in size. There is  no left ventricular hypertrophy. Left ventricular diastolic parameters are consistent with Grade I diastolic dysfunction (impaired relaxation). Right Ventricle: The right ventricular size is normal. No increase in  right ventricular wall thickness. Right ventricular systolic function is normal. There is normal pulmonary artery systolic pressure. The tricuspid regurgitant velocity is 2.31 m/s, and  with an assumed right atrial pressure of 3 mmHg, the estimated right ventricular systolic pressure is 62.2 mmHg. Left Atrium: Left atrial size was normal in size. Right Atrium: Right atrial size was normal in size. Pericardium: Trivial pericardial effusion is present. Presence of pericardial fat pad. Mitral Valve: The mitral valve is grossly normal. Trivial mitral valve regurgitation. No evidence of mitral valve stenosis. Tricuspid Valve: The tricuspid valve is grossly normal. Tricuspid valve regurgitation is trivial. No evidence of tricuspid stenosis. Aortic Valve: The aortic valve is tricuspid. There is moderate calcification of the aortic valve. Aortic valve regurgitation is mild. Aortic regurgitation PHT measures 578 msec. Mild to moderate aortic valve sclerosis/calcification is present, without any evidence of aortic stenosis. Pulmonic Valve: The pulmonic valve was grossly normal. Pulmonic valve regurgitation is not visualized. No evidence of pulmonic stenosis. Aorta: The aortic root and ascending aorta are structurally normal, with no evidence of dilitation. Venous: The inferior vena cava is normal in size with greater than 50% respiratory variability, suggesting right atrial pressure of 3 mmHg. IAS/Shunts: The atrial septum is grossly normal.  LEFT VENTRICLE PLAX 2D LVIDd:         3.30 cm  Diastology LVIDs:         1.80 cm  LV e' medial:    4.79 cm/s LV PW:         0.90 cm  LV E/e' medial:  12.9 LV IVS:        1.10 cm  LV e' lateral:   5.22 cm/s LVOT diam:     2.00 cm  LV E/e' lateral: 11.8 LV SV:         49 LV SV Index:   31 LVOT Area:     3.14 cm  RIGHT VENTRICLE RV S prime:     12.90 cm/s TAPSE (M-mode): 2.1 cm LEFT ATRIUM             Index       RIGHT ATRIUM           Index LA diam:        2.40 cm 1.54 cm/m  RA Area:      15.20 cm LA Vol (A2C):   28.9 ml 18.50 ml/m RA Volume:   38.70 ml  24.77 ml/m LA Vol (A4C):   30.2 ml 19.33 ml/m LA Biplane Vol: 30.9 ml 19.78 ml/m  AORTIC VALVE LVOT Vmax:   81.40  cm/s LVOT Vmean:  56.800 cm/s LVOT VTI:    0.155 m AI PHT:      578 msec  AORTA Ao Root diam: 3.20 cm Ao Asc diam:  2.90 cm MITRAL VALVE               TRICUSPID VALVE MV Area (PHT): 2.66 cm    TR Peak grad:   21.3 mmHg MV Decel Time: 285 msec    TR Vmax:        231.00 cm/s MV E velocity: 61.60 cm/s MV A velocity: 94.00 cm/s  SHUNTS MV E/A ratio:  0.66        Systemic VTI:  0.16 m                            Systemic Diam: 2.00 cm Eleonore Chiquito MD Electronically signed by Eleonore Chiquito MD Signature Date/Time: 11/11/2019/12:06:47 PM    Final       Subjective: No acute issues or events overnight, patient feels quite well, somewhat anxious about discharge given her multiple readmissions over recent history but otherwise feels much improved from admission denies chest pain, nausea, vomiting, diarrhea, constipation.   Discharge Exam: Vitals:   11/17/19 0305 11/17/19 0543  BP:  (!) 153/82  Pulse: 70 63  Resp: 15 18  Temp:  97.9 F (36.6 C)  SpO2: 97% 97%   Vitals:   11/16/19 2154 11/17/19 0041 11/17/19 0305 11/17/19 0543  BP:    (!) 153/82  Pulse: 78  70 63  Resp: 16  15 18   Temp:    97.9 F (36.6 C)  TempSrc:    Oral  SpO2: 96% 96% 97% 97%  Weight:      Height:        General: Cachectic frail-appearing elderly female no acute distress resting comfortably in bed HEENT: Tracheostomy in place with copious secretions Lungs: Coarse bilateral rhonchi likely reverberating from tracheostomy, without acute wheeze or rales Heart:  Regular rate and rhythm.  Without murmurs, rubs, or gallops. Abdomen:  Soft, nontender, nondistended.  Without guarding or rebound. Extremities: Without cyanosis, clubbing, edema, or obvious deformity. Vascular:  Dorsalis pedis and posterior tibial pulses palpable bilaterally. Skin:   Warm and dry, no erythema, no ulcerations.   The results of significant diagnostics from this hospitalization (including imaging, microbiology, ancillary and laboratory) are listed below for reference.     Microbiology: Recent Results (from the past 240 hour(s))  SARS Coronavirus 2 by RT PCR (hospital order, performed in [facility] hospital lab) Nasopharyngeal Nasopharyngeal Swab     Status: None   Collection Time: 11/11/19  1:50 AM   Specimen: Nasopharyngeal Swab  Result Value Ref Range Status   SARS Coronavirus 2 NEGATIVE NEGATIVE Final    Comment: (NOTE) SARS-CoV-2 target nucleic acids are NOT DETECTED.  The SARS-CoV-2 RNA is generally detectable in upper and lower respiratory specimens during the acute phase of infection. The lowest concentration of SARS-CoV-2 viral copies this assay can detect is 250 copies / mL. A negative result does not preclude SARS-CoV-2 infection and should not be used as the sole basis for treatment or other patient management decisions.  A negative result may occur with improper specimen collection / handling, submission of specimen other than nasopharyngeal swab, presence of viral mutation(s) within the areas targeted by this assay, and inadequate number of viral copies (<250 copies / mL). A negative result must be combined with clinical observations, patient history, and epidemiological information.  Fact Sheet for Patients:   StrictlyIdeas.no  Fact Sheet for Healthcare Providers: BankingDealers.co.za  This test is not yet approved or  cleared by the [place] FDA and has been authorized for detection and/or diagnosis of SARS-CoV-2 by FDA under an Emergency Use Authorization (EUA).  This EUA will remain in effect (meaning this test can be used) for the duration of the COVID-19 declaration under Section 564(b)(1) of the Act, 21 U.S.C. section 360bbb-3(b)(1), unless the authorization is terminated  or revoked sooner.  Performed at [facility], [place] [address] [place], [place] [address]   Culture, blood (Routine x 2)     Status: None   Collection Time: 11/11/19  1:54 AM   Specimen: BLOOD  Result Value Ref Range Status   Specimen Description BLOOD SITE NOT SPECIFIED  Final   Special Requests   Final    BOTTLES DRAWN AEROBIC AND ANAEROBIC Blood Culture adequate volume   Culture   Final    NO GROWTH 5 DAYS Performed at [facility], [place] [address] [place], [place] [address]    Report Status 11/16/2019 FINAL  Final  Culture, blood (Routine x 2)     Status: None   Collection Time: 11/11/19  5:15 AM   Specimen: BLOOD  Result Value Ref Range Status   Specimen Description BLOOD LEFT ARM  Final   Special Requests   Final    BOTTLES DRAWN AEROBIC AND ANAEROBIC Blood Culture adequate volume   Culture   Final    NO GROWTH 5 DAYS Performed at [facility], [place] [address] [place], [place] [address]    Report Status 11/16/2019 FINAL  Final  MRSA PCR Screening     Status: None   Collection Time: 11/11/19 10:32 AM   Specimen: Nasal Mucosa; Nasopharyngeal  Result Value Ref Range Status   MRSA by PCR NEGATIVE NEGATIVE Final    Comment:        The GeneXpert MRSA Assay (FDA approved for NASAL specimens only), is one component of a comprehensive MRSA colonization surveillance program. It is not intended to diagnose MRSA infection nor to guide or monitor treatment for MRSA infections. Performed at [facility], [place] [address] [place], [place] [address]   Culture, respiratory (non-expectorated)     Status: None   Collection Time: 11/11/19 11:33 AM   Specimen: Tracheal Aspirate; Respiratory  Result Value Ref Range Status   Specimen Description TRACHEAL ASPIRATE  Final   Special Requests NONE  Final   Gram Stain   Final    ABUNDANT WBC PRESENT,BOTH PMN AND MONONUCLEAR MODERATE GRAM POSITIVE COCCI Performed at [facility], [place] [address],  [place],  [address]    Culture MODERATE STAPHYLOCOCCUS AUREUS  Final   Report Status 11/13/2019 FINAL  Final   Organism ID, Bacteria STAPHYLOCOCCUS AUREUS  Final      Susceptibility   Staphylococcus aureus - MIC*    CIPROFLOXACIN <=0.5 SENSITIVE Sensitive     ERYTHROMYCIN >=8 RESISTANT Resistant     GENTAMICIN <=0.5 SENSITIVE Sensitive     OXACILLIN <=0.25 SENSITIVE Sensitive     TETRACYCLINE <=1 SENSITIVE Sensitive     VANCOMYCIN 1 SENSITIVE Sensitive     TRIMETH/SULFA <=10 SENSITIVE Sensitive     CLINDAMYCIN RESISTANT Resistant     RIFAMPIN <=0.5 SENSITIVE Sensitive     Inducible Clindamycin POSITIVE Resistant     * MODERATE STAPHYLOCOCCUS AUREUS     Labs: BNP (last 3 results) Recent Labs  10/07/19 1440 11/11/19 0947  BNP 91.3 937.9*   Basic Metabolic Panel: Recent Labs  Lab 11/13/19 0153 11/13/19 0153 11/14/19 0233 11/14/19 1432 11/15/19 0123 11/16/19 1149 11/17/19 0227  NA 137   < > 145 146* 142 142 143  K 5.4*   < > 3.2* 3.5 3.2* 4.0 4.0  CL 107   < > 106 105 102 103 107  CO2 24   < > 31 31 31  32 29  GLUCOSE 103*   < > 100* 114* 88 109* 108*  BUN 25*   < > 13 11 11 13 14   CREATININE 1.12*   < > 1.03* 1.04* 1.02* 0.89 0.84  CALCIUM 8.1*   < > 8.8* 9.0 8.7* 8.8* 8.7*  MG 2.9*  --  1.8  --  2.0 1.9 1.8  PHOS 1.6*  --  3.6  --  2.8 1.8* 2.4*   < > = values in this interval not displayed.   Liver Function Tests: Recent Labs  Lab 11/11/19 0154  AST 32  ALT 26  ALKPHOS 97  BILITOT 0.8  PROT 6.9  ALBUMIN 3.4*   No results for input(s): LIPASE, AMYLASE in the last 168 hours. No results for input(s): AMMONIA in the last 168 hours. CBC: Recent Labs  Lab 11/11/19 0154 11/11/19 0247 11/13/19 0153 11/14/19 0233 11/15/19 0123 11/16/19 1149 11/17/19 0227  WBC 10.4   < > 6.8 5.1 5.7 4.6 5.4  NEUTROABS 4.4  --   --   --   --  2.4  --   HGB 11.2*   < > 8.7* 9.0* 9.3* 8.9* 8.6*  HCT 38.9   < > 28.4* 30.6* 30.9* 30.8* 29.5*  MCV 101.3*   < > 97.6 98.4  96.9 98.7 99.3  PLT 191   < > 211 178 179 191 192   < > = values in this interval not displayed.   Cardiac Enzymes: No results for input(s): CKTOTAL, CKMB, CKMBINDEX, TROPONINI in the last 168 hours. BNP: Invalid input(s): POCBNP CBG: Recent Labs  Lab 11/11/19 0922 11/11/19 1635 11/11/19 1948 11/12/19 1948 11/13/19 2002  GLUCAP 84 134* 129* 151* 114*   D-Dimer No results for input(s): DDIMER in the last 72 hours. Hgb A1c No results for input(s): HGBA1C in the last 72 hours. Lipid Profile No results for input(s): CHOL, HDL, LDLCALC, TRIG, CHOLHDL, LDLDIRECT in the last 72 hours. Thyroid function studies No results for input(s): TSH, T4TOTAL, T3FREE, THYROIDAB in the last 72 hours.  Invalid input(s): FREET3 Anemia work up No results for input(s): VITAMINB12, FOLATE, FERRITIN, TIBC, IRON, RETICCTPCT in the last 72 hours. Urinalysis    Component Value Date/Time   COLORURINE YELLOW 07/10/2017 0053   APPEARANCEUR HAZY (A) 07/10/2017 0053   LABSPEC 1.025 07/10/2017 0053   PHURINE 5.0 07/10/2017 0053   GLUCOSEU NEGATIVE 07/10/2017 0053   HGBUR NEGATIVE 07/10/2017 0053   HGBUR trace-lysed 12/30/2006 0956   BILIRUBINUR SMALL (A) 07/10/2017 0053   KETONESUR NEGATIVE 07/10/2017 0053   PROTEINUR 100 (A) 07/10/2017 0053   UROBILINOGEN 4.0 (H) 01/07/2014 2323   NITRITE NEGATIVE 07/10/2017 0053   LEUKOCYTESUR TRACE (A) 07/10/2017 0053   Sepsis Labs Invalid input(s): PROCALCITONIN,  WBC,  LACTICIDVEN Microbiology Recent Results (from the past 240 hour(s))  SARS Coronavirus 2 by RT PCR (hospital order, performed in [place] hospital lab) Nasopharyngeal Nasopharyngeal Swab     Status: None   Collection Time: 11/11/19  1:50 AM   Specimen: Nasopharyngeal Swab  Result Value Ref Range  Status   SARS Coronavirus 2 NEGATIVE NEGATIVE Final    Comment: (NOTE) SARS-CoV-2 target nucleic acids are NOT DETECTED.  The SARS-CoV-2 RNA is generally detectable in upper and lower respiratory  specimens during the acute phase of infection. The lowest concentration of SARS-CoV-2 viral copies this assay can detect is 250 copies / mL. A negative result does not preclude SARS-CoV-2 infection and should not be used as the sole basis for treatment or other patient management decisions.  A negative result may occur with improper specimen collection / handling, submission of specimen other than nasopharyngeal swab, presence of viral mutation(s) within the areas targeted by this assay, and inadequate number of viral copies (<250 copies / mL). A negative result must be combined with clinical observations, patient history, and epidemiological information.  Fact Sheet for Patients:   StrictlyIdeas.no  Fact Sheet for Healthcare Providers: BankingDealers.co.za  This test is not yet approved or  cleared by the [place] FDA and has been authorized for detection and/or diagnosis of SARS-CoV-2 by FDA under an Emergency Use Authorization (EUA).  This EUA will remain in effect (meaning this test can be used) for the duration of the COVID-19 declaration under Section 564(b)(1) of the Act, 21 U.S.C. section 360bbb-3(b)(1), unless the authorization is terminated or revoked sooner.  Performed at [facility], [place] [address] [place], [place] [address]   Culture, blood (Routine x 2)     Status: None   Collection Time: 11/11/19  1:54 AM   Specimen: BLOOD  Result Value Ref Range Status   Specimen Description BLOOD SITE NOT SPECIFIED  Final   Special Requests   Final    BOTTLES DRAWN AEROBIC AND ANAEROBIC Blood Culture adequate volume   Culture   Final    NO GROWTH 5 DAYS Performed at [facility], [place] [address] [place], [place] [address]    Report Status 11/16/2019 FINAL  Final  Culture, blood (Routine x 2)     Status: None   Collection Time: 11/11/19  5:15 AM   Specimen: BLOOD  Result Value Ref Range Status   Specimen  Description BLOOD LEFT ARM  Final   Special Requests   Final    BOTTLES DRAWN AEROBIC AND ANAEROBIC Blood Culture adequate volume   Culture   Final    NO GROWTH 5 DAYS Performed at [facility], [place] [address], [place], [place] [address]    Report Status 11/16/2019 FINAL  Final  MRSA PCR Screening     Status: None   Collection Time: 11/11/19 10:32 AM   Specimen: Nasal Mucosa; Nasopharyngeal  Result Value Ref Range Status   MRSA by PCR NEGATIVE NEGATIVE Final    Comment:        The GeneXpert MRSA Assay (FDA approved for NASAL specimens only), is one component of a comprehensive MRSA colonization surveillance program. It is not intended to diagnose MRSA infection nor to guide or monitor treatment for MRSA infections. Performed at [facility], [place] [address], [place], [place] [address]   Culture, respiratory (non-expectorated)     Status: None   Collection Time: 11/11/19 11:33 AM   Specimen: Tracheal Aspirate; Respiratory  Result Value Ref Range Status   Specimen Description TRACHEAL ASPIRATE  Final   Special Requests NONE  Final   Gram Stain   Final    ABUNDANT WBC PRESENT,BOTH PMN AND MONONUCLEAR MODERATE GRAM POSITIVE COCCI Performed at [facility], [place] [address] [place],  [address]  Culture MODERATE STAPHYLOCOCCUS AUREUS  Final   Report Status 11/13/2019 FINAL  Final   Organism ID, Bacteria STAPHYLOCOCCUS AUREUS  Final      Susceptibility   Staphylococcus aureus - MIC*    CIPROFLOXACIN <=0.5 SENSITIVE Sensitive     ERYTHROMYCIN >=8 RESISTANT Resistant     GENTAMICIN <=0.5 SENSITIVE Sensitive     OXACILLIN <=0.25 SENSITIVE Sensitive     TETRACYCLINE <=1 SENSITIVE Sensitive     VANCOMYCIN 1 SENSITIVE Sensitive     TRIMETH/SULFA <=10 SENSITIVE Sensitive     CLINDAMYCIN RESISTANT Resistant     RIFAMPIN <=0.5 SENSITIVE Sensitive     Inducible Clindamycin POSITIVE Resistant     * MODERATE STAPHYLOCOCCUS AUREUS     Time coordinating  discharge: Over 30 minutes  SIGNED:   Little Ishikawa, DO [facility] 11/17/2019, 8:06 AM Pager   If 7PM-7AM, please contact night-coverage [email or web address]

## 2019-11-17 NOTE — Progress Notes (Signed)
Nutrition Follow-up  DOCUMENTATION CODES:   Non-severe (moderate) malnutrition in context of chronic illness  INTERVENTION:   -Continue Carnation Instant Breakfast with whole milk TID with meals, each supplement mixed with whole milk provides 275 kcal and 13 grams of protein  NUTRITION DIAGNOSIS:   Moderate Malnutrition related to chronic illness (COPD, laryngeal cancer s/p tumor resection and tracheostomy) as evidenced by mild fat depletion, mild muscle depletion, moderate muscle depletion.  Ongoing  GOAL:   Patient will meet greater than or equal to 90% of their needs  Progressing   MONITOR:   PO intake, Supplement acceptance, Labs, Weight trends, I & O's  REASON FOR ASSESSMENT:   Ventilator    ASSESSMENT:   69 yo female admitted with SOB requiring intubation via previous stoma. PMH includes laryngeal cancer s/p tumor resection and tracheostomy in 2019, former smoker, HTN, HLD, stroke, COPD.  9/18 - diet advanced to dysphagia 3 9/19 - diet advanced to soft  Reviewed I/O's: -1.4 L x 24 hours and -2.6 L since admission  UOP: 2.4 L x 24 hours  Pt receiving nursing care at time of visit.   Pt's intake has improved; noted meal completion 50-75%. Pt receiving Carnation Instant Breakfast supplements on meal trays, as she prefers these to Ensure supplements.   Per MD notes, pt is medically stable for discharge. Plan to d/c to SNF once bed is available.   Labs reviewed.   Diet Order:   Diet Order            Diet - low sodium heart healthy           DIET SOFT Room service appropriate? Yes; Fluid consistency: Thin  Diet effective now                 EDUCATION NEEDS:   No education needs have been identified at this time  Skin:  Skin Assessment: Reviewed RN Assessment  Last BM:  11/13/19  Height:   Ht Readings from Last 1 Encounters:  11/11/19 4\' 11"  (1.499 m)    Weight:   Wt Readings from Last 1 Encounters:  11/16/19 62.9 kg    Ideal Body  Weight:  44.7 kg  BMI:  Body mass index is 28.01 kg/m.  Estimated Nutritional Needs:   Kcal:  1550-1750  Protein:  75-90 grams  Fluid:  >/= 1.5 L    Loistine Chance, RD, LDN, [place] Registered Dietitian II Certified Diabetes Care and Education Specialist Please refer to [facility] for RD and/or RD on-call/weekend/after hours pager

## 2019-11-17 NOTE — TOC Progression Note (Signed)
Transition of Care (TOC) - Progression Note  Valentina Gu, BSN Transitions of Care Unit 4E- RN Case Manager See Treatment Team for direct phone # Cross Coverage for 6N   Patient Details  Name: Dave Mannes MRN: [id number] Date of Birth: 08/07/1950  Transition of Care [facility]) CM/SW Contact  Dahlia Client Romeo Rabon, RN Phone Number: 11/17/2019, 1:37 PM  Clinical Narrative:    Follow up call made to [facility] with [facility] regarding bed availability for LTACH- per Corrina there are no beds available today or for the rest of the week. Per further conversation [facility] at this point in time no longer feels patient is appropriate for LTACH level of care  and feels she can be managed at a SNF level of care. MD and CSW have been notified of this update, CSW to f/u with pt and family regarding SNF placement.    Expected Discharge Plan: Long Term Acute Care (LTAC) Barriers to Discharge: No Barriers Identified  Expected Discharge Plan and Services Expected Discharge Plan: Long Term Acute Care (LTAC)         Expected Discharge Date: 11/16/19                                     Social Determinants of Health (SDOH) Interventions    Readmission Risk Interventions No flowsheet data found.

## 2019-11-18 NOTE — Progress Notes (Signed)
Physical Therapy Treatment Patient Details Name: Katelyn [name] MRN: [id number] DOB: Dec 06, 1950 Today's Date: 11/18/2019    History of Present Illness Katelyn [name] is a 69 y.o. female with medical history significant of laryngeal cancer s/p laryngectomy, tracheostomy, gallstone pancreatitis, history of right hip fracture, hyperlipidemia, hypertension, right lower extremity DVT, history of Klebsiella septic shock, history of other nonhemorrhagic CVA with right-sided weakness who was discharged 10/18/19 after a 12-day hospitalization due to respiratory failure secondary to COPD exacerbation, bilateral pneumonia and aspiration pneumonia.  She returns to the emergency department after complaining of exacerbation of dyspnea twice overnight requiring each time to be seen by EMS.     PT Comments    Pt supine in bed on arrival. Pt appears hesitant but agreeable to participate.  Based on [facility] notes patient is no longer a candidate for LTACH will update recommendations to SNF as she continues to present with poor strength and balance.  If she decides against SNF she will require HHPT and 24 hr assistance.    Follow Up Recommendations  SNF;Supervision/Assistance - 24 hour (will require HHPT if SNF denies)     Equipment Recommendations  Rolling walker with 5" wheels;Hospital bed (youth height RW)    Recommendations for Other Services       Precautions / Restrictions Precautions Precautions: Fall Precaution Comments: Trach Restrictions Weight Bearing Restrictions: No    Mobility  Bed Mobility Overal bed mobility: Needs Assistance Bed Mobility: Supine to Sit     Supine to sit: Mod assist     General bed mobility comments: Mod assistance to advance LEs to edge of bed and elevate trunk into a seated position.  Once in sitting increased time and effort to elevate trunk into a seated position.  Transfers Overall transfer level: Needs assistance Equipment used: Rolling walker (2  wheeled) Transfers: Sit to/from Stand Sit to Stand: Min assist         General transfer comment: Min assistance to rise into standing from bed and recliner chair.  Ambulation/Gait Ambulation/Gait assistance: Mod assist Gait Distance (Feet): 6 Feet (x2  ( pt required seated rest period between trials )) Assistive device: Rolling walker (2 wheeled) Gait Pattern/deviations: Step-to pattern;Shuffle;Trunk flexed;Narrow base of support     General Gait Details: Pt continues to fatigue quickly with short bouts of gt training.  Pt with wide waddling gt and decreased weight bearing on R hip.   Stairs             Wheelchair Mobility    Modified Rankin (Stroke Patients Only)       Balance Overall balance assessment: Needs assistance   Sitting balance-Leahy Scale: Fair       Standing balance-Leahy Scale: Poor                              Cognition Arousal/Alertness: Awake/alert Behavior During Therapy: WFL for tasks assessed/performed Overall Cognitive Status: Difficult to assess                                 General Comments: non verbal but appropriate responses and answers by mouthing words. Appears [facility]      Exercises      General Comments        Pertinent Vitals/Pain Pain Assessment: Faces Faces Pain Scale: Hurts little more Pain Location: R hip Pain Descriptors / Indicators: Grimacing Pain Intervention(s): Monitored during session;Repositioned  Home Living                      Prior Function            PT Goals (current goals can now be found in the care plan section) Acute Rehab PT Goals Patient Stated Goal: none stated Potential to Achieve Goals: Fair Progress towards PT goals: Progressing toward goals    Frequency    Min 3X/week      PT Plan Discharge plan needs to be updated    Co-evaluation              AM-PAC PT "6 Clicks" Mobility   Outcome Measure  Help needed turning from your  back to your side while in a flat bed without using bedrails?: A Lot Help needed moving from lying on your back to sitting on the side of a flat bed without using bedrails?: A Lot Help needed moving to and from a bed to a chair (including a wheelchair)?: A Little Help needed standing up from a chair using your arms (e.g., wheelchair or bedside chair)?: A Little Help needed to walk in hospital room?: A Lot Help needed climbing 3-5 steps with a railing? : Total 6 Click Score: 13    End of Session Equipment Utilized During Treatment: Oxygen Activity Tolerance: Patient tolerated treatment well Patient left: in bed;with call bell/phone within reach;with nursing/sitter in room Nurse Communication: Mobility status PT Visit Diagnosis: Muscle weakness (generalized) (M62.81);Other abnormalities of gait and mobility (R26.89)     Time: 8341-9622 PT Time Calculation (min) (ACUTE ONLY): 17 min  Charges:  $Gait Training: 8-22 mins                     Erasmo Leventhal , PTA Acute Rehabilitation Services Pager [phone] Office [phone]     Katelyn [name] 11/18/2019, 12:10 PM

## 2019-11-18 NOTE — Consult Note (Signed)
   [facility] CM Inpatient Consult   11/18/2019  Katelyn [name] January 28, 1951 [id number]   Follow up:  Medicare ACO  Review of inpatient TOC and PT/OT progress notes currently reveals patient is no longer a candidate for LTACH now for SNF.  Plan: Continue to follow for progression and transition.  Natividad Brood, RN BSN [facility] Liaison  [phone] business mobile phone Toll free office [phone]  Fax number: [phone] [name]

## 2019-11-18 NOTE — Progress Notes (Signed)
PROGRESS NOTE    Katelyn [name]  HGD:[id number] DOB: 11/24/1950 DOA: 11/11/2019 PCP: Aretta Nip, MD   Brief Narrative:  This is a 69 year old black female that presented to the emergency room from home.  She has an extensive medical history.  In 2015, she was found to have a laryngeal mass but did not follow-up on that until 2019.  At that time she was found to have stage IV laryngeal cancer.  She was taken to the operating room and had resection of the tumor and a tracheostomy placed at that time.  Since then she has been decannulated but the stoma has remained in place.  She was recently hospitalized from 12 August to 24 August and then from 26 August-31 August.  She was given a course of broad-spectrum antibiotics for right lower lobe infiltrate. There is also a new concern for COPD and was put on Solu-Medrol at that time.  She been discharged home and on 16 September she presented back to the emergency room complaining of diarrhea and shortness of breath. She been discharged to home with home oxygen.  9/17 she presented back to the emergency room with increasing shortness of breath, and productive cough.  Patient was found to be significantly hypoxic on arrival by EMS.  Patient had notable acute hypoxic respiratory failure on chronic respiratory failure in the setting of MSSA pneumonia with history of laryngectomy and tracheostomy.  Pulmonology initially admitted patient to their service in the ICU and was placed on a ventilator but extubated quickly.  She currently is on trach collar supplemental oxygen and doing quite well.  Her other chronic comorbid conditions including laryngeal cancer, severe protein caloric malnutrition, hypertension history of PE and CVA appear to be essentially stable.  At this time she continues to resolve from her MSSA pneumonia on Ancef to complete today on the 23rd.  Per discussion with PCCM and patient we have arranged for patient to transition to LTAC,  pending bed assignment and availability.  Patient otherwise stable and agreeable for discharge, close follow-up in the outpatient setting with PCP and pulmonology for further discussion about decannulating as scheduled.  Patient remains stable for discharge - no longer meets criteria for LTAC and thus has been started for evaluation for discharge to SNF for ongoing care and therapy as outlined below.  Assessment & Plan:   Active Problems:   Acute respiratory failure requiring reintubation (HCC)   Acute on chronic respiratory failure with hypoxia and hypercapnia (HCC)   Acute on chronic hypoxemic respiratory failure due to MSSA pneumonia - Hx of laryngectomy so she should not be aspirating food despite coughing - likely related to copious secretions -patient has completed all antibiotics - Pulm suspects she will decannulate quickly -appreciate insight and recommendations  Anxiety - somewhat poorly controlled - PRN xanax per home regimen - Improving now off the ventilator  Hx largyngeal cancer post laryngectomy; T3-N0 supraglottic carcinoma - Followed by Dr. Eudelia Bunch [facility] - Will need to re-establish care with Dr. Marylene Land as above possibly to be decanulated per PCCM/Pulm  Deconditioning/Protein caloric malnutrition/ambulatory dysfunction present on admission - PT/OT evals - Mobilize as able  - Optimize nutrition   HTN - Continue home metoprolol  Hx of PE - Continue full dose Lovenox  - Holding home eliquis for now - likely transition back at discharge  Prior CVA with R sided weakness, stable Supportive care Therapies as above    DVT prophylaxis: Lovenox Code Status: Full Family Communication: At bedside  Status is: inpt  Dispo: The patient is from: Home              Anticipated d/c is to: SNF, LTAC declined              Anticipated d/c date is: 24-48h pending safe discharge location              Patient currently IS medically stable for discharge  currently awaiting safe discharge location, awaiting SNF and insurance approval as LTAC was declined.  Consultants:   PCCM  Antimicrobials:  Ancef  Subjective: No acute issues or events overnight, patient feels well, denies nausea, vomiting, diarrhea, constipation, headache, fevers, chills..  Objective: Vitals:   11/17/19 2030 11/18/19 0143 11/18/19 0431 11/18/19 0814  BP:   130/74   Pulse:  66 70 82  Resp:  18 18 18   Temp:   97.9 F (36.6 C)   TempSrc:   Oral   SpO2: 97% 96% 96% 98%  Weight:      Height:        Intake/Output Summary (Last 24 hours) at 11/18/2019 0818 Last data filed at 11/17/2019 1404 Gross per 24 hour  Intake 1000 ml  Output 200 ml  Net 800 ml   Filed Weights   11/14/19 0500 11/15/19 0313 11/16/19 0600  Weight: 65.4 kg 65.6 kg 62.9 kg    Examination:  General: Cachectic frail-appearing elderly female no acute distress resting comfortably in bed HEENT: Tracheostomy in place with copious secretions Lungs: Moderate upper airway noise without overt rhonchi wheeze or rales Heart:  Regular rate and rhythm.  Without murmurs, rubs, or gallops. Abdomen:  Soft, nontender, nondistended.  Without guarding or rebound. Extremities: Without cyanosis, clubbing, edema, or obvious deformity. Vascular:  Dorsalis pedis and posterior tibial pulses palpable bilaterally. Skin:  Warm and dry, no erythema, no ulcerations.   Data Reviewed: I have personally reviewed following labs and imaging studies  CBC: Recent Labs  Lab 11/13/19 0153 11/14/19 0233 11/15/19 0123 11/16/19 1149 11/17/19 0227  WBC 6.8 5.1 5.7 4.6 5.4  NEUTROABS  --   --   --  2.4  --   HGB 8.7* 9.0* 9.3* 8.9* 8.6*  HCT 28.4* 30.6* 30.9* 30.8* 29.5*  MCV 97.6 98.4 96.9 98.7 99.3  PLT 211 178 179 191 326   Basic Metabolic Panel: Recent Labs  Lab 11/13/19 0153 11/13/19 0153 11/14/19 0233 11/14/19 1432 11/15/19 0123 11/16/19 1149 11/17/19 0227  NA 137   < > 145 146* 142 142 143  K 5.4*    < > 3.2* 3.5 3.2* 4.0 4.0  CL 107   < > 106 105 102 103 107  CO2 24   < > 31 31 31  32 29  GLUCOSE 103*   < > 100* 114* 88 109* 108*  BUN 25*   < > 13 11 11 13 14   CREATININE 1.12*   < > 1.03* 1.04* 1.02* 0.89 0.84  CALCIUM 8.1*   < > 8.8* 9.0 8.7* 8.8* 8.7*  MG 2.9*  --  1.8  --  2.0 1.9 1.8  PHOS 1.6*  --  3.6  --  2.8 1.8* 2.4*   < > = values in this interval not displayed.   GFR: Estimated Creatinine Clearance: 50.3 mL/min (by C-G formula based on SCr of 0.84 mg/dL). Liver Function Tests: No results for input(s): AST, ALT, ALKPHOS, BILITOT, PROT, ALBUMIN in the last 168 hours. No results for input(s): LIPASE, AMYLASE in the last 168 hours. No results  for input(s): AMMONIA in the last 168 hours. Coagulation Profile: No results for input(s): INR, PROTIME in the last 168 hours. Cardiac Enzymes: No results for input(s): CKTOTAL, CKMB, CKMBINDEX, TROPONINI in the last 168 hours. BNP (last 3 results) No results for input(s): PROBNP in the last 8760 hours. HbA1C: No results for input(s): HGBA1C in the last 72 hours. CBG: Recent Labs  Lab 11/11/19 0922 11/11/19 1635 11/11/19 1948 11/12/19 1948 11/13/19 2002  GLUCAP 84 134* 129* 151* 114*   Lipid Profile: No results for input(s): CHOL, HDL, LDLCALC, TRIG, CHOLHDL, LDLDIRECT in the last 72 hours. Thyroid Function Tests: No results for input(s): TSH, T4TOTAL, FREET4, T3FREE, THYROIDAB in the last 72 hours. Anemia Panel: No results for input(s): VITAMINB12, FOLATE, FERRITIN, TIBC, IRON, RETICCTPCT in the last 72 hours. Sepsis Labs: Recent Labs  Lab 11/11/19 0947  PROCALCITON 0.80    Recent Results (from the past 240 hour(s))  SARS Coronavirus 2 by RT PCR (hospital order, performed in [facility] hospital lab) Nasopharyngeal Nasopharyngeal Swab     Status: None   Collection Time: 11/11/19  1:50 AM   Specimen: Nasopharyngeal Swab  Result Value Ref Range Status   SARS Coronavirus 2 NEGATIVE NEGATIVE Final    Comment:  (NOTE) SARS-CoV-2 target nucleic acids are NOT DETECTED.  The SARS-CoV-2 RNA is generally detectable in upper and lower respiratory specimens during the acute phase of infection. The lowest concentration of SARS-CoV-2 viral copies this assay can detect is 250 copies / mL. A negative result does not preclude SARS-CoV-2 infection and should not be used as the sole basis for treatment or other patient management decisions.  A negative result may occur with improper specimen collection / handling, submission of specimen other than nasopharyngeal swab, presence of viral mutation(s) within the areas targeted by this assay, and inadequate number of viral copies (<250 copies / mL). A negative result must be combined with clinical observations, patient history, and epidemiological information.  Fact Sheet for Patients:   StrictlyIdeas.no  Fact Sheet for Healthcare Providers: BankingDealers.co.za  This test is not yet approved or  cleared by the [place] FDA and has been authorized for detection and/or diagnosis of SARS-CoV-2 by FDA under an Emergency Use Authorization (EUA).  This EUA will remain in effect (meaning this test can be used) for the duration of the COVID-19 declaration under Section 564(b)(1) of the Act, 21 U.S.C. section 360bbb-3(b)(1), unless the authorization is terminated or revoked sooner.  Performed at [facility], [place] [address], [place], [place] [address]   Culture, blood (Routine x 2)     Status: None   Collection Time: 11/11/19  1:54 AM   Specimen: BLOOD  Result Value Ref Range Status   Specimen Description BLOOD SITE NOT SPECIFIED  Final   Special Requests   Final    BOTTLES DRAWN AEROBIC AND ANAEROBIC Blood Culture adequate volume   Culture   Final    NO GROWTH 5 DAYS Performed at [facility], [place] [address] [place], [place] [address]    Report Status 11/16/2019 FINAL  Final  Culture, blood  (Routine x 2)     Status: None   Collection Time: 11/11/19  5:15 AM   Specimen: BLOOD  Result Value Ref Range Status   Specimen Description BLOOD LEFT ARM  Final   Special Requests   Final    BOTTLES DRAWN AEROBIC AND ANAEROBIC Blood Culture adequate volume   Culture   Final    NO GROWTH 5  DAYS Performed at [facility], [place] [address], [place], [place] [address]    Report Status 11/16/2019 FINAL  Final  MRSA PCR Screening     Status: None   Collection Time: 11/11/19 10:32 AM   Specimen: Nasal Mucosa; Nasopharyngeal  Result Value Ref Range Status   MRSA by PCR NEGATIVE NEGATIVE Final    Comment:        The GeneXpert MRSA Assay (FDA approved for NASAL specimens only), is one component of a comprehensive MRSA colonization surveillance program. It is not intended to diagnose MRSA infection nor to guide or monitor treatment for MRSA infections. Performed at [facility], [place] [address] [place], [place] [address]   Culture, respiratory (non-expectorated)     Status: None   Collection Time: 11/11/19 11:33 AM   Specimen: Tracheal Aspirate; Respiratory  Result Value Ref Range Status   Specimen Description TRACHEAL ASPIRATE  Final   Special Requests NONE  Final   Gram Stain   Final    ABUNDANT WBC PRESENT,BOTH PMN AND MONONUCLEAR MODERATE GRAM POSITIVE COCCI Performed at [facility], [place] [address], [place], [place] [address]    Culture MODERATE STAPHYLOCOCCUS AUREUS  Final   Report Status 11/13/2019 FINAL  Final   Organism ID, Bacteria STAPHYLOCOCCUS AUREUS  Final      Susceptibility   Staphylococcus aureus - MIC*    CIPROFLOXACIN <=0.5 SENSITIVE Sensitive     ERYTHROMYCIN >=8 RESISTANT Resistant     GENTAMICIN <=0.5 SENSITIVE Sensitive     OXACILLIN <=0.25 SENSITIVE Sensitive     TETRACYCLINE <=1 SENSITIVE Sensitive     VANCOMYCIN 1 SENSITIVE Sensitive     TRIMETH/SULFA <=10 SENSITIVE Sensitive     CLINDAMYCIN RESISTANT Resistant     RIFAMPIN <=0.5  SENSITIVE Sensitive     Inducible Clindamycin POSITIVE Resistant     * MODERATE STAPHYLOCOCCUS AUREUS         Radiology Studies: DG Chest Port 1 View  Result Date: 11/17/2019 CLINICAL DATA:  Abnormal respirations. EXAM: PORTABLE CHEST 1 VIEW COMPARISON:  11/16/2019. FINDINGS: Tracheostomy tube in stable position. Heart size stable. Persistent bibasilar atelectasis/mild infiltrates. Small right pleural effusion again noted. No pneumothorax. Surgical clips are noted the neck. No acute bony abnormality identified. IMPRESSION: 1.  Tracheostomy tube in stable position. 2. Persistent bibasilar atelectasis/mild infiltrates. Small right pleural effusion again noted. Similar findings on prior exam. Electronically Signed   By: [place]   On: 11/17/2019 06:04        Scheduled Meds: . apixaban  5 mg Oral BID  . atorvastatin  80 mg Oral q1800  . chlorhexidine  15 mL Mouth Rinse BID  . Chlorhexidine Gluconate Cloth  6 each Topical Daily  . mouth rinse  15 mL Mouth Rinse q12n4p  . metoprolol tartrate  25 mg Oral BID   Continuous Infusions:    LOS: 7 days   Time spent: 43min  Jema Deegan C Hailey Miles, DO [facility]  If 7PM-7AM, please contact night-coverage [email or web address]  11/18/2019, 8:18 AM

## 2019-11-18 NOTE — TOC Progression Note (Signed)
Transition of Care [facility]) - Progression Note    Patient Details  Name: Katelyn [name] MRN: [id number] Date of Birth: [birth year]  Transition of Care [facility]) CM/SW [place], [place] Phone Number: 11/18/2019, 10:24 AM  Clinical Narrative:    No SNF offers at this time.   Expected Discharge Plan: [place] (vs [facility]) Barriers to Discharge: Other (comment) (disposition pending, no SNF beds)  Expected Discharge Plan and Services Expected Discharge Plan: [place] (vs [facility]) Expected Discharge Date: 11/16/19                Readmission Risk Interventions No flowsheet data found.

## 2019-11-19 NOTE — Plan of Care (Signed)
  Problem: Education: Goal: Knowledge of General Education information will improve Description Including pain rating scale, medication(s)/side effects and non-pharmacologic comfort measures Outcome: Progressing   

## 2019-11-19 NOTE — Progress Notes (Signed)
PROGRESS NOTE    Katelyn [name]  HMC:[id number] DOB: 01-31-1951 DOA: 11/11/2019 PCP: Aretta Nip, MD   Brief Narrative:  This is a 69 year old black female that presented to the emergency room from home.  She has an extensive medical history.  In 2015, she was found to have a laryngeal mass but did not follow-up on that until 2019.  At that time she was found to have stage IV laryngeal cancer.  She was taken to the operating room and had resection of the tumor and a tracheostomy placed at that time.  Since then she has been decannulated but the stoma has remained in place.  She was recently hospitalized from 12 August to 24 August and then from 26 August-31 August.  She was given a course of broad-spectrum antibiotics for right lower lobe infiltrate. There is also a new concern for COPD and was put on Solu-Medrol at that time.  She been discharged home and on 16 September she presented back to the emergency room complaining of diarrhea and shortness of breath. She been discharged to home with home oxygen.  9/17 she presented back to the emergency room with increasing shortness of breath, and productive cough.  Patient was found to be significantly hypoxic on arrival by EMS.  Patient had notable acute hypoxic respiratory failure on chronic respiratory failure in the setting of MSSA pneumonia with history of laryngectomy and tracheostomy.  Pulmonology initially admitted patient to their service in the ICU and was placed on a ventilator but extubated quickly.  She currently is on trach collar supplemental oxygen and doing quite well.  Her other chronic comorbid conditions including laryngeal cancer, severe protein caloric malnutrition, hypertension history of PE and CVA appear to be essentially stable.  At this time she continues to resolve from her MSSA pneumonia on Ancef to complete today on the 23rd.  Per discussion with PCCM and patient we have arranged for patient to transition to LTAC,  pending bed assignment and availability.  Patient otherwise stable and agreeable for discharge, close follow-up in the outpatient setting with PCP and pulmonology for further discussion about decannulating as scheduled.  Patient remains stable for discharge - no longer meets criteria for LTAC and thus has been started for evaluation for discharge to SNF for ongoing care and therapy as outlined below.  Assessment & Plan:  Active Problems:   Acute respiratory failure requiring reintubation (HCC)   Acute on chronic respiratory failure with hypoxia and hypercapnia (HCC)   Acute on chronic hypoxemic respiratory failure due to MSSA pneumonia - Hx of laryngectomy so she should not be aspirating food despite coughing - likely related to copious secretions - Patient has completed all antibiotics - Pulm suspects she will decannulate quickly -appreciate insight and recommendations  Anxiety - somewhat poorly controlled - PRN xanax per home regimen - Improving now off the ventilator  Hx largyngeal cancer post laryngectomy; T3-N0 supraglottic carcinoma - Followed by Dr. Eudelia Bunch [facility] - Will need to re-establish care with Dr. Marylene Land as above possibly to be decanulated per PCCM/Pulm  Deconditioning/Protein caloric malnutrition/ambulatory dysfunction present on admission - PT/OT evals - Mobilize as able  - Optimize nutrition   HTN - Continue home metoprolol  Hx of PE - Continue full dose Lovenox - Holding home eliquis for now - likely transition back at discharge  Prior CVA with R sided weakness, stable - Supportive care - Therapies as above  DVT prophylaxis: Lovenox Code Status: Full Family Communication: At bedside  Status is: Inpt Dispo: The patient is from: Home              Anticipated d/c is to: SNF, LTAC declined              Anticipated d/c date is: 24-48h pending safe discharge location              Patient currently IS medically stable for discharge currently  awaiting safe discharge location, awaiting SNF and insurance approval as LTAC was declined.  Consultants:   PCCM  Antimicrobials:  Ancef completed 9/24  Subjective: No acute issues or events overnight, patient feels well, denies nausea, vomiting, diarrhea, constipation, headache, fevers, chills.  Patient is now refusing to go to rehab despite multiple discussions with her, her family, case management and physical therapy.  Discussed that she would essentially be wheelchair-bound or bedbound for the rest of her life if she does not continue to participate or interact with therapy in any meaningful way.  Family will continue to discuss with patient although it appears she is requesting discharge home at this time.  Objective: Vitals:   11/18/19 1949 11/18/19 2045 11/19/19 0136 11/19/19 0456  BP: 130/72   125/71  Pulse: 78 76 69 70  Resp: 18 18 16 17   Temp: 98 F (36.7 C)   98.1 F (36.7 C)  TempSrc: Oral   Oral  SpO2: 99% 98% 95% 96%  Weight:      Height:        Intake/Output Summary (Last 24 hours) at 11/19/2019 2025 Last data filed at 11/18/2019 2121 Gross per 24 hour  Intake 480 ml  Output 1200 ml  Net -720 ml   Filed Weights   11/15/19 0313 11/16/19 0600 11/18/19 0500  Weight: 65.6 kg 62.9 kg 63.4 kg    Examination:  General: Cachectic frail-appearing elderly female no acute distress resting comfortably in bed HEENT: Tracheostomy in place with moderate secretions Lungs: Moderate upper airway noise without overt rhonchi wheeze or rales Heart:  Regular rate and rhythm.  Without murmurs, rubs, or gallops. Abdomen:  Soft, nontender, nondistended.  Without guarding or rebound. Extremities: Without cyanosis, clubbing, edema, or obvious deformity. Vascular:  Dorsalis pedis and posterior tibial pulses palpable bilaterally. Skin:  Warm and dry, no erythema, no ulcerations.   Data Reviewed: I have personally reviewed following labs and imaging studies  CBC: Recent Labs    Lab 11/24/19 0153 11/14/19 0233 11/15/19 0123 11/16/19 1149 11/17/19 0227  WBC 6.8 5.1 5.7 4.6 5.4  NEUTROABS  --   --   --  2.4  --   HGB 8.7* 9.0* 9.3* 8.9* 8.6*  HCT 28.4* 30.6* 30.9* 30.8* 29.5*  MCV 97.6 98.4 96.9 98.7 99.3  PLT 211 178 179 191 427   Basic Metabolic Panel: Recent Labs  Lab 24-Nov-2019 0153 24-Nov-2019 0153 11/14/19 0233 11/14/19 1432 11/15/19 0123 11/16/19 1149 11/17/19 0227  NA 137   < > 145 146* 142 142 143  K 5.4*   < > 3.2* 3.5 3.2* 4.0 4.0  CL 107   < > 106 105 102 103 107  CO2 24   < > 31 31 31  32 29  GLUCOSE 103*   < > 100* 114* 88 109* 108*  BUN 25*   < > 13 11 11 13 14   CREATININE 1.12*   < > 1.03* 1.04* 1.02* 0.89 0.84  CALCIUM 8.1*   < > 8.8* 9.0 8.7* 8.8* 8.7*  MG 2.9*  --  1.8  --  2.0 1.9 1.8  PHOS 1.6*  --  3.6  --  2.8 1.8* 2.4*   < > = values in this interval not displayed.   GFR: Estimated Creatinine Clearance: 50.5 mL/min (by C-G formula based on SCr of 0.84 mg/dL). Liver Function Tests: No results for input(s): AST, ALT, ALKPHOS, BILITOT, PROT, ALBUMIN in the last 168 hours. No results for input(s): LIPASE, AMYLASE in the last 168 hours. No results for input(s): AMMONIA in the last 168 hours. Coagulation Profile: No results for input(s): INR, PROTIME in the last 168 hours. Cardiac Enzymes: No results for input(s): CKTOTAL, CKMB, CKMBINDEX, TROPONINI in the last 168 hours. BNP (last 3 results) No results for input(s): PROBNP in the last 8760 hours. HbA1C: No results for input(s): HGBA1C in the last 72 hours. CBG: Recent Labs  Lab 11/12/19 1948 11/13/19 2002  GLUCAP 151* 114*   Lipid Profile: No results for input(s): CHOL, HDL, LDLCALC, TRIG, CHOLHDL, LDLDIRECT in the last 72 hours. Thyroid Function Tests: No results for input(s): TSH, T4TOTAL, FREET4, T3FREE, THYROIDAB in the last 72 hours. Anemia Panel: No results for input(s): VITAMINB12, FOLATE, FERRITIN, TIBC, IRON, RETICCTPCT in the last 72 hours. Sepsis Labs: No  results for input(s): PROCALCITON, LATICACIDVEN in the last 168 hours.  Recent Results (from the past 240 hour(s))  SARS Coronavirus 2 by RT PCR (hospital order, performed in [facility] hospital lab) Nasopharyngeal Nasopharyngeal Swab     Status: None   Collection Time: 11/11/19  1:50 AM   Specimen: Nasopharyngeal Swab  Result Value Ref Range Status   SARS Coronavirus 2 NEGATIVE NEGATIVE Final    Comment: (NOTE) SARS-CoV-2 target nucleic acids are NOT DETECTED.  The SARS-CoV-2 RNA is generally detectable in upper and lower respiratory specimens during the acute phase of infection. The lowest concentration of SARS-CoV-2 viral copies this assay can detect is 250 copies / mL. A negative result does not preclude SARS-CoV-2 infection and should not be used as the sole basis for treatment or other patient management decisions.  A negative result may occur with improper specimen collection / handling, submission of specimen other than nasopharyngeal swab, presence of viral mutation(s) within the areas targeted by this assay, and inadequate number of viral copies (<250 copies / mL). A negative result must be combined with clinical observations, patient history, and epidemiological information.  Fact Sheet for Patients:   StrictlyIdeas.no  Fact Sheet for Healthcare Providers: BankingDealers.co.za  This test is not yet approved or  cleared by the [place] FDA and has been authorized for detection and/or diagnosis of SARS-CoV-2 by FDA under an Emergency Use Authorization (EUA).  This EUA will remain in effect (meaning this test can be used) for the duration of the COVID-19 declaration under Section 564(b)(1) of the Act, 21 U.S.C. section 360bbb-3(b)(1), unless the authorization is terminated or revoked sooner.  Performed at [facility], [place] [address], [place], [place] [address]   Culture, blood (Routine x 2)     Status: None    Collection Time: 11/11/19  1:54 AM   Specimen: BLOOD  Result Value Ref Range Status   Specimen Description BLOOD SITE NOT SPECIFIED  Final   Special Requests   Final    BOTTLES DRAWN AEROBIC AND ANAEROBIC Blood Culture adequate volume   Culture   Final    NO GROWTH 5 DAYS Performed at [facility], [place] [address] [place],  [address]    Report Status 11/16/2019 FINAL  Final  Culture, blood (Routine  x 2)     Status: None   Collection Time: 11/11/19  5:15 AM   Specimen: BLOOD  Result Value Ref Range Status   Specimen Description BLOOD LEFT ARM  Final   Special Requests   Final    BOTTLES DRAWN AEROBIC AND ANAEROBIC Blood Culture adequate volume   Culture   Final    NO GROWTH 5 DAYS Performed at [facility], [address], [place], [place] [address]    Report Status 11/16/2019 FINAL  Final  MRSA PCR Screening     Status: None   Collection Time: 11/11/19 10:32 AM   Specimen: Nasal Mucosa; Nasopharyngeal  Result Value Ref Range Status   MRSA by PCR NEGATIVE NEGATIVE Final    Comment:        The GeneXpert MRSA Assay (FDA approved for NASAL specimens only), is one component of a comprehensive MRSA colonization surveillance program. It is not intended to diagnose MRSA infection nor to guide or monitor treatment for MRSA infections. Performed at [facility], [place] [address] [place], [place] [address]   Culture, respiratory (non-expectorated)     Status: None   Collection Time: 11/11/19 11:33 AM   Specimen: Tracheal Aspirate; Respiratory  Result Value Ref Range Status   Specimen Description TRACHEAL ASPIRATE  Final   Special Requests NONE  Final   Gram Stain   Final    ABUNDANT WBC PRESENT,BOTH PMN AND MONONUCLEAR MODERATE GRAM POSITIVE COCCI Performed at [facility], [place] [address] [place], [place] [address]    Culture MODERATE STAPHYLOCOCCUS AUREUS  Final   Report Status 11/13/2019 FINAL  Final   Organism ID, Bacteria STAPHYLOCOCCUS  AUREUS  Final      Susceptibility   Staphylococcus aureus - MIC*    CIPROFLOXACIN <=0.5 SENSITIVE Sensitive     ERYTHROMYCIN >=8 RESISTANT Resistant     GENTAMICIN <=0.5 SENSITIVE Sensitive     OXACILLIN <=0.25 SENSITIVE Sensitive     TETRACYCLINE <=1 SENSITIVE Sensitive     VANCOMYCIN 1 SENSITIVE Sensitive     TRIMETH/SULFA <=10 SENSITIVE Sensitive     CLINDAMYCIN RESISTANT Resistant     RIFAMPIN <=0.5 SENSITIVE Sensitive     Inducible Clindamycin POSITIVE Resistant     * MODERATE STAPHYLOCOCCUS AUREUS    Radiology Studies: No results found.  Scheduled Meds: . apixaban  5 mg Oral BID  . atorvastatin  80 mg Oral q1800  . chlorhexidine  15 mL Mouth Rinse BID  . Chlorhexidine Gluconate Cloth  6 each Topical Daily  . mouth rinse  15 mL Mouth Rinse q12n4p  . metoprolol tartrate  25 mg Oral BID   Continuous Infusions:    LOS: 8 days   Time spent: 38min  Marlowe Cinquemani C Muntaha Vermette, DO [facility]  If 7PM-7AM, please contact night-coverage [email or web address]  11/19/2019, 8:22 AM

## 2019-11-19 NOTE — TOC Progression Note (Signed)
Transition of Care [facility]) - Progression Note    Patient Details  Name: Katelyn [name] MRN: [id number] Date of Birth: 1950/04/08  Transition of Care [facility] [place] [facility]) CM/SW [place], [place] Phone Number: 11/19/2019, 3:44 PM  Clinical Narrative:     CSW presented pt and [place] daughter with SNF choice. Pt stated she decided to go home. Pt reports she lives with her boyfriend and sister and they will be able to provide her with 24 hour support and assistance. Pt is agreeable to [facility] and has no preferences in facility. Pts Granddaughter  Agrees with pt.   Expected Discharge Plan: [place] (vs [facility]) Barriers to Discharge: Other (comment) (disposition pending, no SNF beds)  Expected Discharge Plan and Services Expected Discharge Plan: [place] (vs [facility])         Expected Discharge Date: 11/16/19                                     Social Determinants of Health (SDOH) Interventions    Readmission Risk Interventions No flowsheet data found.  Emeterio Reeve, Latanya Presser, [place] Social Worker [phone]

## 2019-11-20 NOTE — TOC Progression Note (Signed)
Transition of Care [facility]) - Progression Note    Patient Details  Name: Katelyn [name] MRN: [id number] Date of Birth: 1951/02/12  Transition of Care [facility]) CM/SW Contact  Claudie Leach, RN 11/20/2019, 12:50 PM  Clinical Narrative:    Preparing for patient discharge to home as she has declined SNF.  Patient's daughter and granddaughter are both active caregivers for patient and agree with her decision to d/c home.  They request hospital bed and wheelchair.  The patient has O2 (order for 5lpm), humidifier and nebulizer from [employer].  Daughter, Katelyn [name], reports that portable tanks are empty. She will call [employer] and ask for new tanks as they plan to transport patient home in the car Monday or Tuesday.  Patient's boyfriend will transport her home.    A new WC was ordered for patient at last hospitalization but not delivered per family.  [employer] reports Medicare denied payment because they already paid for one in the last 5 years.  Patient's son was offered private payment options but did not authorize private payment.    A bed and a portable suction device will be delivered by [employer] tomorrow.  The granddaughter or daughter can be contacted about this.    Corene Cornea with [facility] is aware of pending discharge and will restart RN, PT, OT services.   Expected Discharge Plan: [place] Barriers to Discharge: No Barriers Identified  Expected Discharge Plan and Services Expected Discharge Plan: [place] Choice: Home Health   Expected Discharge Date: 11/16/19               DME Arranged: Hospital bed, Suction DME Agency: [employer] Date DME Agency Contacted: 11/20/19 Time DME Agency Contacted: 0900 Representative spoke with at DME Agency: Jeanella Anton HH Arranged: RN, PT, OT [place] Agency: [place] ([place]) Date [place]: 11/20/19 Time [place]: 0900 Representative spoke with at [place]:  [place] ([place]) Interventions    Readmission Risk Interventions No flowsheet data found.

## 2019-11-20 NOTE — Progress Notes (Signed)
SATURATION QUALIFICATIONS: (This note is used to comply with regulatory documentation for home oxygen)  Patient Saturations on Room Air at Rest = %  Patient Saturations on Room Air while Ambulating = %  Patient Saturations on 28%/5 Liters of oxygen while Ambulating =97%  Please briefly explain why patient needs home oxygen: Patient is on trach collar with 28% / 5 Liters oxygenation and with 96-97 % saturation at rest.

## 2019-11-20 NOTE — Progress Notes (Signed)
PROGRESS NOTE    Katelyn [name]  TZG:[id number] DOB: 21-May-1950 DOA: 11/11/2019 PCP: Katelyn Nip, MD   Brief Narrative:  This is a 69 year old black female that presented to the emergency room from home.  She has an extensive medical history.  In 2015, she was found to have a laryngeal mass but did not follow-up on that until 2019.  At that time she was found to have stage IV laryngeal cancer.  She was taken to the operating room and had resection of the tumor and a tracheostomy placed at that time.  Since then she has been decannulated but the stoma has remained in place.  She was recently hospitalized from 12 August to 24 August and then from 26 August-31 August.  She was given a course of broad-spectrum antibiotics for right lower lobe infiltrate. There is also a new concern for COPD and was put on Solu-Medrol at that time.  She been discharged home and on 16 September she presented back to the emergency room complaining of diarrhea and shortness of breath. She been discharged to home with home oxygen.  9/17 she presented back to the emergency room with increasing shortness of breath, and productive cough.  Patient was found to be significantly hypoxic on arrival by EMS.  Patient had notable acute hypoxic respiratory failure on chronic respiratory failure in the setting of MSSA pneumonia with history of laryngectomy and tracheostomy.  Pulmonology initially admitted patient to their service in the ICU and was placed on a ventilator but extubated quickly.  She currently is on trach collar supplemental oxygen and doing quite well.  Her other chronic comorbid conditions including laryngeal cancer, severe protein caloric malnutrition, hypertension history of PE and CVA appear to be essentially stable.  At this time she continues to resolve from her MSSA pneumonia on Ancef to complete today on the 23rd.  Per discussion with PCCM and patient we have arranged for patient to transition to LTAC,  pending bed assignment and availability.  Patient otherwise stable and agreeable for discharge, close follow-up in the outpatient setting with PCP and pulmonology for further discussion about decannulating as scheduled.  Patient remains stable for discharge - no longer meets criteria for LTAC and is now requesting DC home - family agreeable to take her back. CM and care teams working on home DME, medically stable to DC home likely in the next 24h.  Assessment & Plan:  Active Problems:   Acute respiratory failure requiring reintubation (HCC)   Acute on chronic respiratory failure with hypoxia and hypercapnia (HCC)   Acute on chronic hypoxemic respiratory failure due to MSSA pneumonia - Hx of laryngectomy so she should not be aspirating food despite coughing - likely related to copious secretions - Patient has completed all antibiotics - Pulm suspects she will decannulate quickly -appreciate insight and recommendations  Anxiety - somewhat poorly controlled - PRN xanax per home regimen - Improving now off the ventilator  Hx largyngeal cancer post laryngectomy; T3-N0 supraglottic carcinoma - Followed by Dr. Eudelia Bunch [facility] - Will need to re-establish care with Dr. Marylene Land as above possibly to be decanulated per PCCM/Pulm  Deconditioning/Protein caloric malnutrition/ambulatory dysfunction present on admission - PT/OT evals - Mobilize as able  - Optimize nutrition   HTN - Continue home metoprolol  Hx of PE - Continue full dose Lovenox - Holding home eliquis for now - likely transition back at discharge  Prior CVA with R sided weakness, stable - Supportive care - Therapies as above  DVT prophylaxis: Lovenox Code Status: Full Family Communication: At bedside  Status is: Inpt Dispo: The patient is from: Home              Anticipated d/c is to: SNF, LTAC declined              Anticipated d/c date is: 24-48h pending safe discharge location              Patient currently  IS medically stable for discharge currently awaiting safe discharge location, awaiting SNF and insurance approval as LTAC was declined.  Consultants:   PCCM  Antimicrobials:  Ancef completed 9/24  Subjective: No acute issues or events overnight, patient feels well, denies nausea, vomiting, diarrhea, constipation, headache, fevers, chills.   Objective: Vitals:   11/19/19 2014 11/19/19 2040 11/20/19 0312 11/20/19 0506  BP: (!) 147/80   (!) 138/92  Pulse: 76 83 (!) 57 70  Resp: 15 16 16 15   Temp: 98.6 F (37 C)   (!) 97.5 F (36.4 C)  TempSrc: Oral   Oral  SpO2: 99% 97% 97% 99%  Weight:      Height:        Intake/Output Summary (Last 24 hours) at 11/20/2019 5885 Last data filed at 11/20/2019 0506 Gross per 24 hour  Intake 460 ml  Output 900 ml  Net -440 ml   Filed Weights   11/15/19 0313 11/16/19 0600 11/18/19 0500  Weight: 65.6 kg 62.9 kg 63.4 kg    Examination:  General: Cachectic frail-appearing elderly female no acute distress resting comfortably in bed HEENT: Tracheostomy in place with moderate secretions Lungs: Moderate upper airway noise without overt rhonchi wheeze or rales Heart:  Regular rate and rhythm.  Without murmurs, rubs, or gallops. Abdomen:  Soft, nontender, nondistended.  Without guarding or rebound. Extremities: Without cyanosis, clubbing, edema, or obvious deformity. Vascular:  Dorsalis pedis and posterior tibial pulses palpable bilaterally. Skin:  Warm and dry, no erythema, no ulcerations.   Data Reviewed: I have personally reviewed following labs and imaging studies  CBC: Recent Labs  Lab 11/14/19 0233 11/15/19 0123 11/16/19 1149 11/17/19 0227  WBC 5.1 5.7 4.6 5.4  NEUTROABS  --   --  2.4  --   HGB 9.0* 9.3* 8.9* 8.6*  HCT 30.6* 30.9* 30.8* 29.5*  MCV 98.4 96.9 98.7 99.3  PLT 178 179 191 027   Basic Metabolic Panel: Recent Labs  Lab 11/14/19 0233 11/14/19 1432 11/15/19 0123 11/16/19 1149 11/17/19 0227  NA 145 146* 142 142  143  K 3.2* 3.5 3.2* 4.0 4.0  CL 106 105 102 103 107  CO2 31 31 31  32 29  GLUCOSE 100* 114* 88 109* 108*  BUN 13 11 11 13 14   CREATININE 1.03* 1.04* 1.02* 0.89 0.84  CALCIUM 8.8* 9.0 8.7* 8.8* 8.7*  MG 1.8  --  2.0 1.9 1.8  PHOS 3.6  --  2.8 1.8* 2.4*   GFR: Estimated Creatinine Clearance: 50.5 mL/min (by C-G formula based on SCr of 0.84 mg/dL). Liver Function Tests: No results for input(s): AST, ALT, ALKPHOS, BILITOT, PROT, ALBUMIN in the last 168 hours. No results for input(s): LIPASE, AMYLASE in the last 168 hours. No results for input(s): AMMONIA in the last 168 hours. Coagulation Profile: No results for input(s): INR, PROTIME in the last 168 hours. Cardiac Enzymes: No results for input(s): CKTOTAL, CKMB, CKMBINDEX, TROPONINI in the last 168 hours. BNP (last 3 results) No results for input(s): PROBNP in the last 8760 hours. HbA1C: No results  for input(s): HGBA1C in the last 72 hours. CBG: Recent Labs  Lab 11/13/19 2002  GLUCAP 114*   Lipid Profile: No results for input(s): CHOL, HDL, LDLCALC, TRIG, CHOLHDL, LDLDIRECT in the last 72 hours. Thyroid Function Tests: No results for input(s): TSH, T4TOTAL, FREET4, T3FREE, THYROIDAB in the last 72 hours. Anemia Panel: No results for input(s): VITAMINB12, FOLATE, FERRITIN, TIBC, IRON, RETICCTPCT in the last 72 hours. Sepsis Labs: No results for input(s): PROCALCITON, LATICACIDVEN in the last 168 hours.  Recent Results (from the past 240 hour(s))  SARS Coronavirus 2 by RT PCR (hospital order, performed in [facility] hospital lab) Nasopharyngeal Nasopharyngeal Swab     Status: None   Collection Time: 11/11/19  1:50 AM   Specimen: Nasopharyngeal Swab  Result Value Ref Range Status   SARS Coronavirus 2 NEGATIVE NEGATIVE Final    Comment: (NOTE) SARS-CoV-2 target nucleic acids are NOT DETECTED.  The SARS-CoV-2 RNA is generally detectable in upper and lower respiratory specimens during the acute phase of infection. The  lowest concentration of SARS-CoV-2 viral copies this assay can detect is 250 copies / mL. A negative result does not preclude SARS-CoV-2 infection and should not be used as the sole basis for treatment or other patient management decisions.  A negative result may occur with improper specimen collection / handling, submission of specimen other than nasopharyngeal swab, presence of viral mutation(s) within the areas targeted by this assay, and inadequate number of viral copies (<250 copies / mL). A negative result must be combined with clinical observations, patient history, and epidemiological information.  Fact Sheet for Patients:   StrictlyIdeas.no  Fact Sheet for Healthcare Providers: BankingDealers.co.za  This test is not yet approved or  cleared by the [place] FDA and has been authorized for detection and/or diagnosis of SARS-CoV-2 by FDA under an Emergency Use Authorization (EUA).  This EUA will remain in effect (meaning this test can be used) for the duration of the COVID-19 declaration under Section 564(b)(1) of the Act, 21 U.S.C. section 360bbb-3(b)(1), unless the authorization is terminated or revoked sooner.  Performed at [facility], [place] [address] [place], [place] [address]   Culture, blood (Routine x 2)     Status: None   Collection Time: 11/11/19  1:54 AM   Specimen: BLOOD  Result Value Ref Range Status   Specimen Description BLOOD SITE NOT SPECIFIED  Final   Special Requests   Final    BOTTLES DRAWN AEROBIC AND ANAEROBIC Blood Culture adequate volume   Culture   Final    NO GROWTH 5 DAYS Performed at [facility], [place] [address], [place], [place] [address]    Report Status 11/16/2019 FINAL  Final  Culture, blood (Routine x 2)     Status: None   Collection Time: 11/11/19  5:15 AM   Specimen: BLOOD  Result Value Ref Range Status   Specimen Description BLOOD LEFT ARM  Final   Special Requests    Final    BOTTLES DRAWN AEROBIC AND ANAEROBIC Blood Culture adequate volume   Culture   Final    NO GROWTH 5 DAYS Performed at [facility], [place] [address], [place], [place] [address]    Report Status 11/16/2019 FINAL  Final  MRSA PCR Screening     Status: None   Collection Time: 11/11/19 10:32 AM   Specimen: Nasal Mucosa; Nasopharyngeal  Result Value Ref Range Status   MRSA by PCR NEGATIVE NEGATIVE Final    Comment:  The GeneXpert MRSA Assay (FDA approved for NASAL specimens only), is one component of a comprehensive MRSA colonization surveillance program. It is not intended to diagnose MRSA infection nor to guide or monitor treatment for MRSA infections. Performed at [facility], [place] [address] [place], [place] [address]   Culture, respiratory (non-expectorated)     Status: None   Collection Time: 11/11/19 11:33 AM   Specimen: Tracheal Aspirate; Respiratory  Result Value Ref Range Status   Specimen Description TRACHEAL ASPIRATE  Final   Special Requests NONE  Final   Gram Stain   Final    ABUNDANT WBC PRESENT,BOTH PMN AND MONONUCLEAR MODERATE GRAM POSITIVE COCCI Performed at [facility], [place] [address], [place], [place] [address]    Culture MODERATE STAPHYLOCOCCUS AUREUS  Final   Report Status 11/13/2019 FINAL  Final   Organism ID, Bacteria STAPHYLOCOCCUS AUREUS  Final      Susceptibility   Staphylococcus aureus - MIC*    CIPROFLOXACIN <=0.5 SENSITIVE Sensitive     ERYTHROMYCIN >=8 RESISTANT Resistant     GENTAMICIN <=0.5 SENSITIVE Sensitive     OXACILLIN <=0.25 SENSITIVE Sensitive     TETRACYCLINE <=1 SENSITIVE Sensitive     VANCOMYCIN 1 SENSITIVE Sensitive     TRIMETH/SULFA <=10 SENSITIVE Sensitive     CLINDAMYCIN RESISTANT Resistant     RIFAMPIN <=0.5 SENSITIVE Sensitive     Inducible Clindamycin POSITIVE Resistant     * MODERATE STAPHYLOCOCCUS AUREUS    Radiology Studies: No results found.  Scheduled Meds: . apixaban  5 mg Oral  BID  . atorvastatin  80 mg Oral q1800  . chlorhexidine  15 mL Mouth Rinse BID  . Chlorhexidine Gluconate Cloth  6 each Topical Daily  . mouth rinse  15 mL Mouth Rinse q12n4p  . metoprolol tartrate  25 mg Oral BID   Continuous Infusions:    LOS: 9 days   Time spent: 3min  Markavious Micco C Jaylen Claude, DO [facility]  If 7PM-7AM, please contact night-coverage [email or web address]  11/20/2019, 8:23 AM

## 2019-11-20 NOTE — Care Management (Addendum)
    Durable Medical Equipment  (From admission, onward)         Start     Ordered   11/20/19 0925  For home use only DME Hospital bed  Once       Question Answer Comment  Length of Need Lifetime   Patient has (list medical condition): laryngectomy, tracheostomy,  history of right hip fracture,  history of other nonhemorrhagic CVA with right-sided weakness, COPD   The above medical condition requires: Patient requires the ability to reposition immediately   Head must be elevated greater than: 30 degrees   Bed type Semi-electric   Support Surface: Gel Overlay      11/20/19 0350

## 2019-11-21 NOTE — Progress Notes (Signed)
Discharge instructions (including medications) discussed with and copy provided to patient.  Caregiver ([name]) called to let him know that patient is dressed, packed and ready for discharge.

## 2019-11-21 NOTE — Progress Notes (Signed)
PT Cancellation Note  Patient Details Name: Katelyn [name] MRN: [id number] DOB: Aug 07, 1950   Cancelled Treatment:    Reason Eval/Treat Not Completed: (P) Other (comment) (Pt refused multiple times reporting," No thanks, I'm going home.")   Reid Nawrot Eli Hose 11/21/2019, 2:46 PM  Erasmo Leventhal , PTA Acute Rehabilitation Services Pager [phone] Office [phone]

## 2019-11-21 NOTE — Discharge Summary (Signed)
Physician Discharge Summary  Katelyn [name] CZY:[id number] DOB: [birth year] DOA: 11/11/2019  PCP: Aretta Nip, MD  Admit date: 11/11/2019 Discharge date: 11/21/2019  Admitted From: Home Disposition: LTAC  Recommendations for Outpatient Follow-up:  1. Follow up with PCP in 1-2 weeks; pulmonology as scheduled  Discharge Condition: Stable CODE STATUS: Full Diet recommendation: Soft diet as tolerated  Brief/Interim Summary: This is a 69 year old black female that presented to the emergency room from home. She has an extensive medical history. In 2015, she was found to have a laryngeal mass but did not follow-up on that until 2019. At that time she was found to have stage IV laryngeal cancer. She was taken to the operating room and had resection of the tumor and a tracheostomy placed at that time. Since then she has been decannulated but the stoma has remained in place. She was recently hospitalized from 12 August to 24 August and then from 26 August-31 August. She was given a course of broad-spectrum antibiotics for right lower lobe infiltrate. There is also a new concern for COPD and was put on Solu-Medrol at that time. She been discharged home and on 16 September she presented back to the emergency room complaining of diarrhea and shortness of breath. She been discharged to home with home oxygen. 9/17 she presented back to the emergency room with increasing shortness of breath, and productive cough. Patient was found to be significantly hypoxic on arrival by EMS.  Patient had notable acute hypoxic respiratory failure on chronic respiratory failure in the setting of MSSA pneumonia with history of laryngectomy and tracheostomy.  Pulmonology initially admitted patient to their service in the ICU and was placed on a ventilator but extubated quickly.  She currently is on trach collar supplemental oxygen and doing quite well.  Her other chronic comorbid conditions including laryngeal  cancer, severe protein caloric malnutrition, hypertension history of PE and CVA appear to be essentially stable.  At this time she continues to resolve from her MSSA pneumonia on Ancef to complete today on the 23rd.  Per discussion with PCCM and patient they had previously attempted to arrange for patient to transition to LTAC, pending bed assignment and availability.  Patient otherwise stable and agreeable for discharge, close follow-up in the outpatient setting with PCP and pulmonology for further discussion about decannulating as scheduled.  Patient remains stable for discharge - no longer meets criteria for LTAC and is now requesting DC home - family agreeable to take her back. CM and care teams working on home DME, medically stable to DC home likely in the next 24h.  Discharge Diagnoses:  Active Problems:   Acute respiratory failure requiring reintubation (HCC)   Acute on chronic respiratory failure with hypoxia and hypercapnia [facility])    Discharge Instructions  Discharge Instructions    Call MD for:  difficulty breathing, headache or visual disturbances   Complete by: As directed    Call MD for:  difficulty breathing, headache or visual disturbances   Complete by: As directed    Call MD for:  temperature >100.4   Complete by: As directed    Call MD for:  temperature >100.4   Complete by: As directed    Diet - low sodium heart healthy   Complete by: As directed    Increase activity slowly   Complete by: As directed    Increase activity slowly   Complete by: As directed      Allergies as of 11/21/2019   No Known Allergies  Medication List    TAKE these medications   ALPRAZolam 0.5 MG tablet Commonly known as: XANAX Take 1 tablet (0.5 mg total) by mouth 3 (three) times daily as needed for anxiety.   apixaban 5 MG Tabs tablet Commonly known as: ELIQUIS Take 1 tablet (5 mg total) by mouth 2 (two) times daily.   atorvastatin 80 MG tablet Commonly known as: LIPITOR Take  1 tablet (80 mg total) by mouth daily. What changed: when to take this   guaiFENesin-dextromethorphan 100-10 MG/5ML syrup Commonly known as: ROBITUSSIN DM Take 10 mLs by mouth every 6 (six) hours.   melatonin 3 MG Tabs tablet Take 1 tablet (3 mg total) by mouth at bedtime as needed (insomnia).   metoprolol tartrate 25 MG tablet Commonly known as: LOPRESSOR Take 1 tablet (25 mg total) by mouth 2 (two) times daily.   nystatin cream Commonly known as: MYCOSTATIN Apply 1 application topically 2 (two) times daily.   polycarbophil 625 MG tablet Commonly known as: FIBERCON Take 1 tablet (625 mg total) by mouth daily.            Durable Medical Equipment  (From admission, onward)         Start     Ordered   11/20/19 1245  For home use only DME Suction  Once       Question:  Suction  Answer:  Trach   11/20/19 1245   11/20/19 0925  For home use only DME Hospital bed  Once       Question Answer Comment  Length of Need Lifetime   Patient has (list medical condition): laryngectomy, tracheostomy,  history of right hip fracture,  history of other nonhemorrhagic CVA with right-sided weakness, COPD   The above medical condition requires: Patient requires the ability to reposition immediately   Head must be elevated greater than: 30 degrees   Bed type Semi-electric   Support Surface: Gel Overlay      11/20/19 0928          No Known Allergies  Consultations:  PCCM   Procedures/Studies: DG Chest Port 1 View  Result Date: 11/17/2019 CLINICAL DATA:  Abnormal respirations. EXAM: PORTABLE CHEST 1 VIEW COMPARISON:  11/16/2019. FINDINGS: Tracheostomy tube in stable position. Heart size stable. Persistent bibasilar atelectasis/mild infiltrates. Small right pleural effusion again noted. No pneumothorax. Surgical clips are noted the neck. No acute bony abnormality identified. IMPRESSION: 1.  Tracheostomy tube in stable position. 2. Persistent bibasilar atelectasis/mild infiltrates.  Small right pleural effusion again noted. Similar findings on prior exam. Electronically Signed   By: [place]   On: 11/17/2019 06:04   DG Chest Port 1 View  Result Date: 11/16/2019 CLINICAL DATA:  Abnormal respiration. History of laryngeal carcinoma. EXAM: PORTABLE CHEST 1 VIEW COMPARISON:  November 12, 2019. FINDINGS: Stable cardiomediastinal silhouette. Tracheostomy tube is in good position. No pneumothorax is noted. Stable bibasilar subsegmental atelectasis is noted with probable small right pleural effusion. Bony thorax is unremarkable. IMPRESSION: Stable bibasilar subsegmental atelectasis with probable small right pleural effusion. Electronically Signed   By: Marijo Conception M.D.   On: 11/16/2019 08:13   DG Chest Port 1 View  Result Date: 11/12/2019 CLINICAL DATA:  Acute respiratory failure. EXAM: PORTABLE CHEST 1 VIEW COMPARISON:  11/11/2019 FINDINGS: Tracheostomy tube tip is stable above the carina. Heart size appears normal. Suspect small right pleural effusion with veil like opacification of the right lower lobe. Mild subsegmental bibasilar atelectasis. IMPRESSION: 1. Suspect small right pleural effusion. 2. Bibasilar  atelectasis. Electronically Signed   By: Kerby Moors M.D.   On: 11/12/2019 09:11   DG Chest Port 1 View  Result Date: 11/11/2019 CLINICAL DATA:  Repositioning of endotracheal tube EXAM: PORTABLE CHEST 1 VIEW COMPARISON:  11/11/2019, 11/10/2019, 10/20/2019 FINDINGS: Tracheostomy tube tip overlies the proximal right mainstem bronchus. Consider retracting by approximately 3 cm. Improved aeration bilaterally with residual ground-glass infiltrates. Normal heart size. No pneumothorax. IMPRESSION: Tracheostomy tube tip overlies the proximal right mainstem bronchus. Consider retracting by approximately 3 cm for more optimal positioning. Improved aeration bilaterally with residual ground-glass infiltrates. Critical Value/emergent results were called by telephone at the time of  interpretation on 11/11/2019 at 3:31 am to provider [facility] , who verbally acknowledged these results. Electronically Signed   By: Donavan Foil M.D.   On: 11/11/2019 03:31   DG Chest Portable 1 View  Result Date: 11/11/2019 CLINICAL DATA:  Tracheostomy, shortness of breath, hypoxia EXAM: PORTABLE CHEST 1 VIEW COMPARISON:  11/10/2019 FINDINGS: Single frontal view of the chest demonstrates a tracheostomy tube tip overlying the right mainstem bronchus. Recommend retracting tracheostomy tube 2.5 cm. Cardiac silhouette is stable. There is developing consolidation within the left lung, which may be hypoventilatory given right mainstem intubation. Nonspecific consolidation at the right lung base could be airspace disease or atelectasis. No effusion or pneumothorax. IMPRESSION: 1. Tracheostomy tube tip overlying right mainstem bronchus. Recommend retracting 2.5 cm. 2. Likely developing hypoventilatory changes bilaterally. Follow-up imaging after tracheostomy tube adjustment recommended to assess resolution. Critical Value/emergent results were called by telephone at the time of interpretation on 11/11/2019 at 1:51 am to provider [facility] , who verbally acknowledged these results. Electronically Signed   By: Randa Ngo M.D.   On: 11/11/2019 01:51   DG Chest Port 1 View  Result Date: 11/10/2019 CLINICAL DATA:  Shortness of breath. EXAM: PORTABLE CHEST 1 VIEW COMPARISON:  10/20/2019 FINDINGS: The cardiac silhouette, mediastinal and hilar contours are within normal limits and stable. Stable mild tortuosity and calcification of the thoracic aorta. Streaky basilar scarring changes but no infiltrates, edema or effusions. The bony thorax is intact. IMPRESSION: Streaky basilar scarring changes. No acute overlying pulmonary process. Electronically Signed   By: Marijo Sanes M.D.   On: 11/10/2019 07:43   ECHOCARDIOGRAM COMPLETE  Result Date: 11/11/2019    ECHOCARDIOGRAM REPORT   Patient Name:    Katelyn [name] Date of Exam: 11/11/2019 Medical Rec #:  [id number]         Height:       59.0 in Accession #:    [id number]        Weight:       135.4 lb Date of Birth:  [birth year]         BSA:          1.562 m Patient Age:    33 years          BP:           103/72 mmHg Patient Gender: F                 HR:           72 bpm. Exam Location:  Inpatient Procedure: 2D Echo, Color Doppler and Cardiac Doppler Indications:    I50.9* Heart failure (unspecified)  History:        Patient has prior history of Echocardiogram examinations, most                 recent 10/07/2019. COPD; Risk Factors:Hypertension and  Dyslipidemia.  Sonographer:    Raquel Sarna Senior RDCS Referring Phys: [place]  Sonographer Comments: Scanned supine on artificial respirator. IMPRESSIONS  1. Left ventricular ejection fraction, by estimation, is 65 to 70%. The left ventricle has normal function. The left ventricle has no regional wall motion abnormalities. Left ventricular diastolic parameters are consistent with Grade I diastolic dysfunction (impaired relaxation).  2. Right ventricular systolic function is normal. The right ventricular size is normal. There is normal pulmonary artery systolic pressure. The estimated right ventricular systolic pressure is 16.1 mmHg.  3. The mitral valve is grossly normal. Trivial mitral valve regurgitation. No evidence of mitral stenosis.  4. The aortic valve is tricuspid. There is moderate calcification of the aortic valve. Aortic valve regurgitation is mild. Mild to moderate aortic valve sclerosis/calcification is present, without any evidence of aortic stenosis.  5. The inferior vena cava is normal in size with greater than 50% respiratory variability, suggesting right atrial pressure of 3 mmHg. Comparison(s): No significant change from prior study. FINDINGS  Left Ventricle: Left ventricular ejection fraction, by estimation, is 65 to 70%. The left ventricle has normal function. The  left ventricle has no regional wall motion abnormalities. The left ventricular internal cavity size was normal in size. There is  no left ventricular hypertrophy. Left ventricular diastolic parameters are consistent with Grade I diastolic dysfunction (impaired relaxation). Right Ventricle: The right ventricular size is normal. No increase in right ventricular wall thickness. Right ventricular systolic function is normal. There is normal pulmonary artery systolic pressure. The tricuspid regurgitant velocity is 2.31 m/s, and  with an assumed right atrial pressure of 3 mmHg, the estimated right ventricular systolic pressure is 09.6 mmHg. Left Atrium: Left atrial size was normal in size. Right Atrium: Right atrial size was normal in size. Pericardium: Trivial pericardial effusion is present. Presence of pericardial fat pad. Mitral Valve: The mitral valve is grossly normal. Trivial mitral valve regurgitation. No evidence of mitral valve stenosis. Tricuspid Valve: The tricuspid valve is grossly normal. Tricuspid valve regurgitation is trivial. No evidence of tricuspid stenosis. Aortic Valve: The aortic valve is tricuspid. There is moderate calcification of the aortic valve. Aortic valve regurgitation is mild. Aortic regurgitation PHT measures 578 msec. Mild to moderate aortic valve sclerosis/calcification is present, without any evidence of aortic stenosis. Pulmonic Valve: The pulmonic valve was grossly normal. Pulmonic valve regurgitation is not visualized. No evidence of pulmonic stenosis. Aorta: The aortic root and ascending aorta are structurally normal, with no evidence of dilitation. Venous: The inferior vena cava is normal in size with greater than 50% respiratory variability, suggesting right atrial pressure of 3 mmHg. IAS/Shunts: The atrial septum is grossly normal.  LEFT VENTRICLE PLAX 2D LVIDd:         3.30 cm  Diastology LVIDs:         1.80 cm  LV e' medial:    4.79 cm/s LV PW:         0.90 cm  LV E/e' medial:   12.9 LV IVS:        1.10 cm  LV e' lateral:   5.22 cm/s LVOT diam:     2.00 cm  LV E/e' lateral: 11.8 LV SV:         49 LV SV Index:   31 LVOT Area:     3.14 cm  RIGHT VENTRICLE RV S prime:     12.90 cm/s TAPSE (M-mode): 2.1 cm LEFT ATRIUM  Index       RIGHT ATRIUM           Index LA diam:        2.40 cm 1.54 cm/m  RA Area:     15.20 cm LA Vol (A2C):   28.9 ml 18.50 ml/m RA Volume:   38.70 ml  24.77 ml/m LA Vol (A4C):   30.2 ml 19.33 ml/m LA Biplane Vol: 30.9 ml 19.78 ml/m  AORTIC VALVE LVOT Vmax:   81.40 cm/s LVOT Vmean:  56.800 cm/s LVOT VTI:    0.155 m AI PHT:      578 msec  AORTA Ao Root diam: 3.20 cm Ao Asc diam:  2.90 cm MITRAL VALVE               TRICUSPID VALVE MV Area (PHT): 2.66 cm    TR Peak grad:   21.3 mmHg MV Decel Time: 285 msec    TR Vmax:        231.00 cm/s MV E velocity: 61.60 cm/s MV A velocity: 94.00 cm/s  SHUNTS MV E/A ratio:  0.66        Systemic VTI:  0.16 m                            Systemic Diam: 2.00 cm Eleonore Chiquito MD Electronically signed by Eleonore Chiquito MD Signature Date/Time: 11/11/2019/12:06:47 PM    Final      Subjective: No acute issues or events overnight, patient feels quite well, somewhat anxious about discharge given her multiple readmissions over recent history but otherwise feels much improved from admission denies chest pain, nausea, vomiting, diarrhea, constipation.   Discharge Exam: Vitals:   11/21/19 0204 11/21/19 0505  BP:  139/70  Pulse: 65 68  Resp: 18 18  Temp:  98 F (36.7 C)  SpO2: 96% 97%   Vitals:   11/20/19 2045 11/20/19 2118 11/21/19 0204 11/21/19 0505  BP: 140/71   139/70  Pulse: 70 77 65 68  Resp: 18 16 18 18   Temp: 97.8 F (36.6 C)   98 F (36.7 C)  TempSrc: Oral   Oral  SpO2: 100% 94% 96% 97%  Weight:      Height:        General: Cachectic frail-appearing elderly female no acute distress resting comfortably in bed HEENT: Tracheostomy in place with copious secretions Lungs: Coarse bilateral rhonchi likely  reverberating from tracheostomy, without acute wheeze or rales Heart:  Regular rate and rhythm.  Without murmurs, rubs, or gallops. Abdomen:  Soft, nontender, nondistended.  Without guarding or rebound. Extremities: Without cyanosis, clubbing, edema, or obvious deformity. Vascular:  Dorsalis pedis and posterior tibial pulses palpable bilaterally. Skin:  Warm and dry, no erythema, no ulcerations.   The results of significant diagnostics from this hospitalization (including imaging, microbiology, ancillary and laboratory) are listed below for reference.     Microbiology: Recent Results (from the past 240 hour(s))  MRSA PCR Screening     Status: None   Collection Time: 11/11/19 10:32 AM   Specimen: Nasal Mucosa; Nasopharyngeal  Result Value Ref Range Status   MRSA by PCR NEGATIVE NEGATIVE Final    Comment:        The GeneXpert MRSA Assay (FDA approved for NASAL specimens only), is one component of a comprehensive MRSA colonization surveillance program. It is not intended to diagnose MRSA infection nor to guide or monitor treatment for MRSA infections. Performed at [facility], [address] [place], [place] [address]   Culture, respiratory (non-expectorated)     Status: None   Collection Time: 11/11/19 11:33 AM   Specimen: Tracheal Aspirate; Respiratory  Result Value Ref Range Status   Specimen Description TRACHEAL ASPIRATE  Final   Special Requests NONE  Final   Gram Stain   Final    ABUNDANT WBC PRESENT,BOTH PMN AND MONONUCLEAR MODERATE GRAM POSITIVE COCCI Performed at [facility], [place] [address] [place], [place] [address]    Culture MODERATE STAPHYLOCOCCUS AUREUS  Final   Report Status 11/13/2019 FINAL  Final   Organism ID, Bacteria STAPHYLOCOCCUS AUREUS  Final      Susceptibility   Staphylococcus aureus - MIC*    CIPROFLOXACIN <=0.5 SENSITIVE Sensitive     ERYTHROMYCIN >=8 RESISTANT Resistant     GENTAMICIN <=0.5 SENSITIVE Sensitive     OXACILLIN  <=0.25 SENSITIVE Sensitive     TETRACYCLINE <=1 SENSITIVE Sensitive     VANCOMYCIN 1 SENSITIVE Sensitive     TRIMETH/SULFA <=10 SENSITIVE Sensitive     CLINDAMYCIN RESISTANT Resistant     RIFAMPIN <=0.5 SENSITIVE Sensitive     Inducible Clindamycin POSITIVE Resistant     * MODERATE STAPHYLOCOCCUS AUREUS     Labs: BNP (last 3 results) Recent Labs    10/07/19 1440 11/11/19 0947  BNP 91.3 086.7*   Basic Metabolic Panel: Recent Labs  Lab 11/14/19 1432 11/15/19 0123 11/16/19 1149 11/17/19 0227  NA 146* 142 142 143  K 3.5 3.2* 4.0 4.0  CL 105 102 103 107  CO2 31 31 32 29  GLUCOSE 114* 88 109* 108*  BUN 11 11 13 14   CREATININE 1.04* 1.02* 0.89 0.84  CALCIUM 9.0 8.7* 8.8* 8.7*  MG  --  2.0 1.9 1.8  PHOS  --  2.8 1.8* 2.4*   Liver Function Tests: No results for input(s): AST, ALT, ALKPHOS, BILITOT, PROT, ALBUMIN in the last 168 hours. No results for input(s): LIPASE, AMYLASE in the last 168 hours. No results for input(s): AMMONIA in the last 168 hours. CBC: Recent Labs  Lab 11/15/19 0123 11/16/19 1149 11/17/19 0227  WBC 5.7 4.6 5.4  NEUTROABS  --  2.4  --   HGB 9.3* 8.9* 8.6*  HCT 30.9* 30.8* 29.5*  MCV 96.9 98.7 99.3  PLT 179 191 192   Cardiac Enzymes: No results for input(s): CKTOTAL, CKMB, CKMBINDEX, TROPONINI in the last 168 hours. BNP: Invalid input(s): POCBNP CBG: No results for input(s): GLUCAP in the last 168 hours. D-Dimer No results for input(s): DDIMER in the last 72 hours. Hgb A1c No results for input(s): HGBA1C in the last 72 hours. Lipid Profile No results for input(s): CHOL, HDL, LDLCALC, TRIG, CHOLHDL, LDLDIRECT in the last 72 hours. Thyroid function studies No results for input(s): TSH, T4TOTAL, T3FREE, THYROIDAB in the last 72 hours.  Invalid input(s): FREET3 Anemia work up No results for input(s): VITAMINB12, FOLATE, FERRITIN, TIBC, IRON, RETICCTPCT in the last 72 hours. Urinalysis    Component Value Date/Time   COLORURINE YELLOW  07/10/2017 0053   APPEARANCEUR HAZY (A) 07/10/2017 0053   LABSPEC 1.025 07/10/2017 0053   PHURINE 5.0 07/10/2017 0053   GLUCOSEU NEGATIVE 07/10/2017 0053   HGBUR NEGATIVE 07/10/2017 0053   HGBUR trace-lysed 12/30/2006 0956   BILIRUBINUR SMALL (A) 07/10/2017 0053   KETONESUR NEGATIVE 07/10/2017 0053   PROTEINUR 100 (A) 07/10/2017 0053   UROBILINOGEN 4.0 (H) 01/07/2014 2323   NITRITE NEGATIVE 07/10/2017 0053   LEUKOCYTESUR TRACE (A) 07/10/2017 0053  Sepsis Labs Invalid input(s): PROCALCITONIN,  WBC,  LACTICIDVEN Microbiology Recent Results (from the past 240 hour(s))  MRSA PCR Screening     Status: None   Collection Time: 11/11/19 10:32 AM   Specimen: Nasal Mucosa; Nasopharyngeal  Result Value Ref Range Status   MRSA by PCR NEGATIVE NEGATIVE Final    Comment:        The GeneXpert MRSA Assay (FDA approved for NASAL specimens only), is one component of a comprehensive MRSA colonization surveillance program. It is not intended to diagnose MRSA infection nor to guide or monitor treatment for MRSA infections. Performed at [facility], [place] [address], [place], [place] [address]   Culture, respiratory (non-expectorated)     Status: None   Collection Time: 11/11/19 11:33 AM   Specimen: Tracheal Aspirate; Respiratory  Result Value Ref Range Status   Specimen Description TRACHEAL ASPIRATE  Final   Special Requests NONE  Final   Gram Stain   Final    ABUNDANT WBC PRESENT,BOTH PMN AND MONONUCLEAR MODERATE GRAM POSITIVE COCCI Performed at [facility], [place] [address], [place], [place] [address]    Culture MODERATE STAPHYLOCOCCUS AUREUS  Final   Report Status 11/13/2019 FINAL  Final   Organism ID, Bacteria STAPHYLOCOCCUS AUREUS  Final      Susceptibility   Staphylococcus aureus - MIC*    CIPROFLOXACIN <=0.5 SENSITIVE Sensitive     ERYTHROMYCIN >=8 RESISTANT Resistant     GENTAMICIN <=0.5 SENSITIVE Sensitive     OXACILLIN <=0.25 SENSITIVE Sensitive     TETRACYCLINE  <=1 SENSITIVE Sensitive     VANCOMYCIN 1 SENSITIVE Sensitive     TRIMETH/SULFA <=10 SENSITIVE Sensitive     CLINDAMYCIN RESISTANT Resistant     RIFAMPIN <=0.5 SENSITIVE Sensitive     Inducible Clindamycin POSITIVE Resistant     * MODERATE STAPHYLOCOCCUS AUREUS     Time coordinating discharge: Over 30 minutes  SIGNED:   Little Ishikawa, DO [facility] 11/21/2019, 8:23 AM Pager   If 7PM-7AM, please contact night-coverage [email or web address]

## 2019-11-21 NOTE — Plan of Care (Signed)

## 2019-11-21 NOTE — TOC Transition Note (Signed)
Transition of Care [facility]) - CM/SW Discharge Note   Patient Details  Name: Katelyn [name] MRN: [id number] Date of Birth: [birth year]  [facility] CM/SW Contact:  Curlene Labrum, RN Phone Number: 11/21/2019, 11:58 AM   Clinical Narrative:    Case Management placed home health orders for PT, RN, OT for continued services with [facility].  I called Janae Sauce with [facility] and confirmed that patient is being discharged home with family later on today.  I called [employer] and they are delivering a hospital bed and suction set up to the home after 12 noon today.  Otherwise, patient is able to be discharged once the family receives the dme for home.     Barriers to Discharge: No Barriers Identified   Patient Goals and CMS Choice Patient states their goals for this hospitalization and ongoing recovery are:: to go home CMS Medicare.gov Compare Post Acute Care list provided to:: Patient Represenative (must comment) Choice offered to / list presented to : Adult Children  Discharge Placement                       Discharge Plan and Services     Post Acute Care Choice: Home Health          DME Arranged: Hospital bed, Suction DME Agency: [facility] Date DME Agency Contacted: 11/20/19 Time DME Agency Contacted: 0900 Representative spoke with at DME Agency: [place]: RN, PT, OT [facility] Agency: [place] ([place]) Date [place]: 11/20/19 Time [place]: 0900 Representative spoke with at [place]: [place] ([place]) Interventions     Readmission Risk Interventions No flowsheet data found.

## 2019-11-21 NOTE — Progress Notes (Signed)
Patient discharged and wheeled down to car.

## 2019-11-21 NOTE — Consult Note (Signed)
   [facility] Inpatient Consult   11/21/2019  Katelyn [name] Jun 17, 1950 [id number]   [facility] [ACO] Patient:  Medicare  Change is disposition declining SNF and wants home.  Inpatient TOC notes reviewed.  Patient getting ready for transitioning home when entering room with  Respiratory Therapist at bedside with getting supplies together.  A brochure and 24 hour nurse advise line magnet placed in her bag. Spoke with patient at bedside to explain [place] Management services will provide a follow up call.  Patient nods head yes and mouthed 'thank you'.   Patient will receive post hospital discharge call and for assessments fo community needs for care/disease management.  Patient with home health set up noteed.  Of note, [facility] Care Management services does not replace or interfere with any services that are arranged by inpatient Transition of Care [TOC] team     For additional questions or referrals please contact:    Natividad Brood, RN BSN [facility] Liaison  [phone] business mobile phone Toll free office [phone]  Fax number: [phone] [email or web address]  [email or web address]

## 2019-11-21 NOTE — Progress Notes (Signed)
Patient made aware of her discharge today, still awaiting for hospital bed and suction set up to be delivered home. Mr. Katelyn [name] was at bedside and made aware that they are awaiting for these items to be delivered home, he went back to setup for bed and will call nurse at [phone] to inform of delivery and will come to pick patient up.

## 2019-11-22 ENCOUNTER — Other Ambulatory Visit: Payer: Self-pay | Admitting: *Deleted

## 2019-11-22 DIAGNOSIS — J440 Chronic obstructive pulmonary disease with acute lower respiratory infection: Secondary | ICD-10-CM | POA: Diagnosis not present

## 2019-11-22 DIAGNOSIS — J9621 Acute and chronic respiratory failure with hypoxia: Secondary | ICD-10-CM | POA: Diagnosis not present

## 2019-11-22 DIAGNOSIS — J441 Chronic obstructive pulmonary disease with (acute) exacerbation: Secondary | ICD-10-CM | POA: Diagnosis not present

## 2019-11-22 DIAGNOSIS — I130 Hypertensive heart and chronic kidney disease with heart failure and stage 1 through stage 4 chronic kidney disease, or unspecified chronic kidney disease: Secondary | ICD-10-CM | POA: Diagnosis not present

## 2019-11-22 DIAGNOSIS — J9622 Acute and chronic respiratory failure with hypercapnia: Secondary | ICD-10-CM | POA: Diagnosis not present

## 2019-11-22 DIAGNOSIS — J15211 Pneumonia due to Methicillin susceptible Staphylococcus aureus: Secondary | ICD-10-CM | POA: Diagnosis not present

## 2019-11-22 NOTE — Patient Outreach (Signed)
[facility]) Care Management  11/22/2019  Rue Valladares 1950/07/30 [id number]  Initial telephone assessment. Spoke with pt's HCPOA, grand daughter Eddie Candle.  Introduced [facility] and plan to follow and assist. [name] agreed to plan, gave consent.  Pt has been in and out of the hospital with respiratory failure with trach for many years.. She has a hx of same, hx hypopharyx ca, COPD, HTN, CVA, CKD.  She is cared for by her family. Main caregivers are her daughters and grand daughter. They are all trained with taking care of her trach.   Will see Dr. Radene Ou tomorrow. No problem with transportation.  Has not received call from home health yet.  Patient was recently discharged from hospital and all medications have been reviewed. Outpatient Encounter Medications as of 11/22/2019  Medication Sig  . ALPRAZolam (XANAX) 0.5 MG tablet Take 1 tablet (0.5 mg total) by mouth 3 (three) times daily as needed for anxiety.  Marland Kitchen apixaban (ELIQUIS) 5 MG TABS tablet Take 1 tablet (5 mg total) by mouth 2 (two) times daily.  Marland Kitchen atorvastatin (LIPITOR) 80 MG tablet Take 1 tablet (80 mg total) by mouth daily. (Patient taking differently: Take 80 mg by mouth at bedtime. )  . guaiFENesin-dextromethorphan (ROBITUSSIN DM) 100-10 MG/5ML syrup Take 10 mLs by mouth every 6 (six) hours.  . melatonin 3 MG TABS tablet Take 1 tablet (3 mg total) by mouth at bedtime as needed (insomnia).  . metoprolol tartrate (LOPRESSOR) 25 MG tablet Take 1 tablet (25 mg total) by mouth 2 (two) times daily.  Marland Kitchen nystatin cream (MYCOSTATIN) Apply 1 application topically 2 (two) times daily.  . polycarbophil (FIBERCON) 625 MG tablet Take 1 tablet (625 mg total) by mouth daily.   No facility-administered encounter medications on file as of 11/22/2019.   PLAN: Needs HCPOA docs in chart, will ask [name] to provide.  Encourage flu vaccine.  Pt/family had Palliative Care consult in August and outpt call for this was made  but was refused by unidentified female. Would like to revisit this support.  I will call again next week.  [name]. [name], MSN, [facility] Gerontological Nurse Practitioner [facility] Care Management [phone]

## 2019-11-23 DIAGNOSIS — J441 Chronic obstructive pulmonary disease with (acute) exacerbation: Secondary | ICD-10-CM | POA: Diagnosis not present

## 2019-11-23 DIAGNOSIS — J9621 Acute and chronic respiratory failure with hypoxia: Secondary | ICD-10-CM | POA: Diagnosis not present

## 2019-11-23 DIAGNOSIS — J9622 Acute and chronic respiratory failure with hypercapnia: Secondary | ICD-10-CM | POA: Diagnosis not present

## 2019-11-23 DIAGNOSIS — J440 Chronic obstructive pulmonary disease with acute lower respiratory infection: Secondary | ICD-10-CM | POA: Diagnosis not present

## 2019-11-23 DIAGNOSIS — I130 Hypertensive heart and chronic kidney disease with heart failure and stage 1 through stage 4 chronic kidney disease, or unspecified chronic kidney disease: Secondary | ICD-10-CM | POA: Diagnosis not present

## 2019-11-23 DIAGNOSIS — J15211 Pneumonia due to Methicillin susceptible Staphylococcus aureus: Secondary | ICD-10-CM | POA: Diagnosis not present

## 2019-11-24 ENCOUNTER — Inpatient Hospital Stay (HOSPITAL_COMMUNITY)
Admission: EM | Admit: 2019-11-24 | Discharge: 2019-11-26 | DRG: 208 | Disposition: A | Discharge status: Home Health | Payer: Medicare Other | Attending: Internal Medicine | Admitting: Internal Medicine

## 2019-11-24 ENCOUNTER — Encounter (HOSPITAL_COMMUNITY): Payer: Self-pay

## 2019-11-24 ENCOUNTER — Emergency Department (HOSPITAL_COMMUNITY): Payer: Medicare Other

## 2019-11-24 DIAGNOSIS — T17990A Other foreign object in respiratory tract, part unspecified in causing asphyxiation, initial encounter: Secondary | ICD-10-CM | POA: Diagnosis present

## 2019-11-24 DIAGNOSIS — R000 Tachycardia, unspecified: Secondary | ICD-10-CM | POA: Diagnosis present

## 2019-11-24 DIAGNOSIS — R0603 Acute respiratory distress: Secondary | ICD-10-CM

## 2019-11-24 DIAGNOSIS — Z9002 Acquired absence of larynx: Secondary | ICD-10-CM

## 2019-11-24 DIAGNOSIS — Z79899 Other long term (current) drug therapy: Secondary | ICD-10-CM

## 2019-11-24 DIAGNOSIS — E785 Hyperlipidemia, unspecified: Secondary | ICD-10-CM | POA: Diagnosis present

## 2019-11-24 DIAGNOSIS — Z87891 Personal history of nicotine dependence: Secondary | ICD-10-CM | POA: Diagnosis not present

## 2019-11-24 DIAGNOSIS — F419 Anxiety disorder, unspecified: Secondary | ICD-10-CM | POA: Diagnosis present

## 2019-11-24 DIAGNOSIS — Z20822 Contact with and (suspected) exposure to covid-19: Secondary | ICD-10-CM | POA: Diagnosis present

## 2019-11-24 DIAGNOSIS — J9811 Atelectasis: Secondary | ICD-10-CM | POA: Diagnosis not present

## 2019-11-24 DIAGNOSIS — C321 Malignant neoplasm of supraglottis: Secondary | ICD-10-CM | POA: Diagnosis present

## 2019-11-24 DIAGNOSIS — I10 Essential (primary) hypertension: Secondary | ICD-10-CM | POA: Diagnosis present

## 2019-11-24 DIAGNOSIS — J80 Acute respiratory distress syndrome: Secondary | ICD-10-CM | POA: Diagnosis not present

## 2019-11-24 DIAGNOSIS — J9621 Acute and chronic respiratory failure with hypoxia: Principal | ICD-10-CM | POA: Diagnosis present

## 2019-11-24 DIAGNOSIS — R0681 Apnea, not elsewhere classified: Secondary | ICD-10-CM | POA: Diagnosis not present

## 2019-11-24 DIAGNOSIS — Z8701 Personal history of pneumonia (recurrent): Secondary | ICD-10-CM | POA: Diagnosis not present

## 2019-11-24 DIAGNOSIS — I69351 Hemiplegia and hemiparesis following cerebral infarction affecting right dominant side: Secondary | ICD-10-CM

## 2019-11-24 DIAGNOSIS — R0902 Hypoxemia: Secondary | ICD-10-CM | POA: Diagnosis not present

## 2019-11-24 DIAGNOSIS — Z8521 Personal history of malignant neoplasm of larynx: Secondary | ICD-10-CM

## 2019-11-24 DIAGNOSIS — Z86718 Personal history of other venous thrombosis and embolism: Secondary | ICD-10-CM | POA: Diagnosis not present

## 2019-11-24 DIAGNOSIS — Z86711 Personal history of pulmonary embolism: Secondary | ICD-10-CM

## 2019-11-24 DIAGNOSIS — R4020 Unspecified coma: Secondary | ICD-10-CM | POA: Diagnosis not present

## 2019-11-24 DIAGNOSIS — Z803 Family history of malignant neoplasm of breast: Secondary | ICD-10-CM | POA: Diagnosis not present

## 2019-11-24 DIAGNOSIS — J449 Chronic obstructive pulmonary disease, unspecified: Secondary | ICD-10-CM | POA: Diagnosis present

## 2019-11-24 DIAGNOSIS — Z7901 Long term (current) use of anticoagulants: Secondary | ICD-10-CM | POA: Diagnosis not present

## 2019-11-24 DIAGNOSIS — Z23 Encounter for immunization: Secondary | ICD-10-CM | POA: Diagnosis not present

## 2019-11-24 DIAGNOSIS — R404 Transient alteration of awareness: Secondary | ICD-10-CM | POA: Diagnosis present

## 2019-11-24 DIAGNOSIS — J9600 Acute respiratory failure, unspecified whether with hypoxia or hypercapnia: Secondary | ICD-10-CM | POA: Diagnosis not present

## 2019-11-24 DIAGNOSIS — Z833 Family history of diabetes mellitus: Secondary | ICD-10-CM

## 2019-11-24 LAB — BASIC METABOLIC PANEL
Anion gap: 10 (ref 5–15)
BUN: 15 mg/dL (ref 8–23)
CO2: 25 mmol/L (ref 22–32)
Calcium: 9.1 mg/dL (ref 8.9–10.3)
Chloride: 108 mmol/L (ref 98–111)
Creatinine, Ser: 0.89 mg/dL (ref 0.44–1.00)
GFR calc Af Amer: >60 mL/min (ref >60)
GFR calc non Af Amer: >60 mL/min (ref >60)
Glucose, Bld: 135 mg/dL — ABNORMAL HIGH (ref 70–99)
Potassium: 3.4 mmol/L — ABNORMAL LOW (ref 3.5–5.1)
Sodium: 143 mmol/L (ref 135–145)

## 2019-11-24 LAB — I-STAT ARTERIAL BLOOD GAS, ED
Acid-Base Excess: 6 mmol/L — ABNORMAL HIGH (ref 0.0–2.0)
Bicarbonate: 29.2 mmol/L — ABNORMAL HIGH (ref 20.0–28.0)
Calcium, Ion: 1.19 mmol/L (ref 1.15–1.40)
HCT: 28 % — ABNORMAL LOW (ref 36.0–46.0)
Hemoglobin: 9.5 g/dL — ABNORMAL LOW (ref 12.0–15.0)
O2 Saturation: 100 %
Patient temperature: 97.6
Potassium: 3.8 mmol/L (ref 3.5–5.1)
Sodium: 142 mmol/L (ref 135–145)
TCO2: 30 mmol/L (ref 22–32)
pCO2 arterial: 36.2 mmHg (ref 32.0–48.0)
pH, Arterial: 7.513 — ABNORMAL HIGH (ref 7.350–7.450)
pO2, Arterial: 396 mmHg — ABNORMAL HIGH (ref 83.0–108.0)

## 2019-11-24 LAB — CBC WITH DIFFERENTIAL/PLATELET
Abs Immature Granulocytes: 0.07 10*3/uL (ref 0.00–0.07)
Basophils Absolute: 0 10*3/uL (ref 0.0–0.1)
Basophils Relative: 0 %
Eosinophils Absolute: 0.1 10*3/uL (ref 0.0–0.5)
Eosinophils Relative: 0 %
HCT: 30.8 % — ABNORMAL LOW (ref 36.0–46.0)
Hemoglobin: 9.2 g/dL — ABNORMAL LOW (ref 12.0–15.0)
Immature Granulocytes: 1 %
Lymphocytes Relative: 8 %
Lymphs Abs: 0.9 10*3/uL (ref 0.7–4.0)
MCH: 29.2 pg (ref 26.0–34.0)
MCHC: 29.9 g/dL — ABNORMAL LOW (ref 30.0–36.0)
MCV: 97.8 fL (ref 80.0–100.0)
Monocytes Absolute: 0.7 10*3/uL (ref 0.1–1.0)
Monocytes Relative: 6 %
Neutro Abs: 10 10*3/uL — ABNORMAL HIGH (ref 1.7–7.7)
Neutrophils Relative %: 85 %
Platelets: 209 10*3/uL (ref 150–400)
RBC: 3.15 MIL/uL — ABNORMAL LOW (ref 3.87–5.11)
RDW: 13.3 % (ref 11.5–15.5)
WBC: 11.7 10*3/uL — ABNORMAL HIGH (ref 4.0–10.5)
nRBC: 0 % (ref 0.0–0.2)

## 2019-11-24 LAB — RESPIRATORY PANEL BY RT PCR (FLU A&B, COVID)
Influenza A by PCR: NEGATIVE
Influenza B by PCR: NEGATIVE
SARS Coronavirus 2 by RT PCR: NEGATIVE

## 2019-11-24 LAB — MRSA PCR SCREENING: MRSA by PCR: NEGATIVE

## 2019-11-24 LAB — GLUCOSE, CAPILLARY: Glucose-Capillary: 88 mg/dL (ref 70–99)

## 2019-11-24 MED ORDER — ORAL CARE MOUTH RINSE
15.0000 mL | Freq: Two times a day (BID) | OROMUCOSAL | Status: DC
  Administered 2019-11-25 – 2019-11-26 (×2): 15 mL via OROMUCOSAL

## 2019-11-24 MED ORDER — MELATONIN 3 MG PO TABS
3.0000 mg | ORAL_TABLET | Freq: Every evening | ORAL | Status: DC | PRN
  Administered 2019-11-25: 3 mg via ORAL
  Filled 2019-11-24: qty 1

## 2019-11-24 MED ORDER — POLYETHYLENE GLYCOL 3350 17 G PO PACK: 17.0000 g | PACK | Freq: Every day | ORAL | Status: DC | PRN

## 2019-11-24 MED ORDER — ONDANSETRON HCL 4 MG/2ML IJ SOLN: 4.0000 mg | Freq: Four times a day (QID) | INTRAMUSCULAR | Status: DC | PRN

## 2019-11-24 MED ORDER — CHLORHEXIDINE GLUCONATE 0.12 % MT SOLN
15.0000 mL | Freq: Two times a day (BID) | OROMUCOSAL | Status: DC
  Administered 2019-11-25 – 2019-11-26 (×3): 15 mL via OROMUCOSAL
  Filled 2019-11-24 (×2): qty 15

## 2019-11-24 MED ORDER — ATORVASTATIN CALCIUM 80 MG PO TABS
80.0000 mg | ORAL_TABLET | Freq: Every day | ORAL | Status: DC
  Administered 2019-11-24 – 2019-11-26 (×3): 80 mg via ORAL
  Filled 2019-11-24 (×2): qty 2
  Filled 2019-11-24: qty 1

## 2019-11-24 MED ORDER — CHLORHEXIDINE GLUCONATE CLOTH 2 % EX PADS: 6.0000 | MEDICATED_PAD | Freq: Every day | CUTANEOUS | Status: DC

## 2019-11-24 MED ORDER — ADULT MULTIVITAMIN W/MINERALS CH
1.0000 | ORAL_TABLET | Freq: Every day | ORAL | Status: DC
  Administered 2019-11-24 – 2019-11-26 (×3): 1 via ORAL
  Filled 2019-11-24 (×3): qty 1

## 2019-11-24 MED ORDER — ALPRAZOLAM 0.5 MG PO TABS: 0.5000 mg | ORAL_TABLET | Freq: Three times a day (TID) | ORAL | Status: DC | PRN

## 2019-11-24 MED ORDER — DOCUSATE SODIUM 50 MG/5ML PO LIQD
100.0000 mg | Freq: Two times a day (BID) | ORAL | Status: DC | PRN
  Filled 2019-11-24: qty 10

## 2019-11-24 MED ORDER — CHLORHEXIDINE GLUCONATE CLOTH 2 % EX PADS
6.0000 | MEDICATED_PAD | Freq: Every day | CUTANEOUS | Status: DC
  Administered 2019-11-24 – 2019-11-26 (×2): 6 via TOPICAL

## 2019-11-24 MED ORDER — PANTOPRAZOLE SODIUM 40 MG PO PACK
40.0000 mg | PACK | Freq: Every day | ORAL | Status: DC
  Filled 2019-11-24: qty 20

## 2019-11-24 MED ORDER — APIXABAN 5 MG PO TABS
5.0000 mg | ORAL_TABLET | Freq: Two times a day (BID) | ORAL | Status: DC
  Administered 2019-11-24: 5 mg
  Filled 2019-11-24: qty 1

## 2019-11-24 MED ORDER — ORAL CARE MOUTH RINSE: 15.0000 mL | OROMUCOSAL | Status: DC

## 2019-11-24 MED ORDER — METOPROLOL TARTRATE 25 MG PO TABS
25.0000 mg | ORAL_TABLET | Freq: Two times a day (BID) | ORAL | Status: DC
  Administered 2019-11-25 – 2019-11-26 (×3): 25 mg via ORAL
  Filled 2019-11-24: qty 2
  Filled 2019-11-24 (×2): qty 1

## 2019-11-24 MED ORDER — CHLORHEXIDINE GLUCONATE 0.12% ORAL RINSE (MEDLINE KIT): 15.0000 mL | Freq: Two times a day (BID) | OROMUCOSAL | Status: DC

## 2019-11-24 MED ORDER — PANTOPRAZOLE SODIUM 40 MG PO TBEC: 40.0000 mg | DELAYED_RELEASE_TABLET | Freq: Every day | ORAL | Status: DC

## 2019-11-24 MED ORDER — CALCIUM POLYCARBOPHIL 625 MG PO TABS
625.0000 mg | ORAL_TABLET | Freq: Every day | ORAL | Status: DC
  Administered 2019-11-24 – 2019-11-26 (×3): 625 mg via ORAL
  Filled 2019-11-24 (×3): qty 1

## 2019-11-24 NOTE — TOC Initial Note (Signed)
Transition of Care [facility]) - Initial/Assessment Note    Patient Details  Name: Katelyn [name] MRN: [id number] Date of Birth: 11/18/1950  Transition of Care [facility]) CM/SW Contact:    Verdell Carmine, RN Phone Number: 11/24/2019, 2:01 PM  Clinical Narrative:                 Readmission from home, PT and OT consulted last admission, recommended LTAC or SNF. Patient long term trach. Family and patient elected to go home. Granddaughter is established as first contact. Does have SO and daughter. Daughter [name] is listed as second per conversation previously.  Now had hypoxic event at home, back on ventilator here in ICU. CM and CSW will follow for needs moving forward.   Expected Discharge Plan: Skilled Nursing Facility Barriers to Discharge: Continued Medical Work up   Patient Goals and CMS Choice        Expected Discharge Plan and Services Expected Discharge Plan: [place] In-house Referral: Clinical Social Work Discharge Planning Services: CM Consult   Living arrangements for the past 2 months: [place]                                      Prior Living Arrangements/Services Living arrangements for the past 2 months: Single Family Home Lives with:: Adult Children, Significant Other Patient language and need for interpreter reviewed:: Yes        Need for Family Participation in Patient Care: Yes (Comment) Care giver support system in place?: Yes (comment) Current home services: DME Criminal Activity/Legal Involvement Pertinent to Current Situation/Hospitalization: No - Comment as needed  Activities of Daily Living      Permission Sought/Granted                  Emotional Assessment   Attitude/Demeanor/Rapport: Intubated (Following Commands or Not Following Commands) Affect (typically observed): Unable to Assess   Alcohol / Substance Use: Not Applicable Psych Involvement: No (comment)  Admission diagnosis:  Acute  respiratory failure ([place]) [J96.00] Hypoxia [R09.02] Patient Active Problem List   Diagnosis Date Noted  . Acute respiratory failure ([place]) 11/24/2019  . Acute on chronic respiratory failure with hypoxia and hypercapnia (HCC)   . Acute respiratory failure requiring reintubation ([place]) 11/11/2019  . Loose stools 11/06/2019  . Benign essential HTN   . Chronic obstructive pulmonary disease ([place])   . Debility 10/25/2019  . Acute hypoxemic respiratory failure ([place]) 10/21/2019  . Goals of care, counseling/discussion   . Palliative care by specialist   . History of noncompliance with medical treatment   . COPD exacerbation ([place]) 10/06/2019  . Generalized weakness 07/12/2017  . Palliative care encounter 07/12/2017  . Cancer of hypopharynx ([place]) 06/24/2017  . Pressure injury of skin 05/30/2017  . Malnutrition of moderate degree 05/30/2017  . Hip fx, right, closed, initial encounter ([place]) 05/29/2017  . Closed displaced intertrochanteric fracture of right femur ([place]) 05/29/2017  . Dysphagia 05/29/2017  . Weight loss 05/29/2017  . Difficult airway for intubation 05/29/2017  . Lesion of epiglottis 05/26/2014  . Abnormal CT scan, pancreas or bile duct   . Right leg DVT ([place]) 05/08/2014  . Anemia 01/09/2014  . Hip pain, right 05/06/2011  . 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 CKD (chronic kidney disease) stage 3, GFR 30-59 ml/min (HCC) 03/04/2011  . Diastolic dysfunction 42/70/6237  . Tobacco abuse 03/04/2011  . Constipation 03/04/2011  . Malignant  hypertensive urgency 03/01/2011  . Cerebral infarction ([place]) 03/01/2011  . NEUROPATHY, IDIOPATHIC 10/25/2007  . Hypercholesteremia 12/15/2006  . Essential hypertension 12/15/2006   PCP:  Aretta Nip, MD Pharmacy:   [facility] [place]), [place] - [address] [place] [address] Phone: [phone] Fax: [phone]     Social Determinants of Health (SDOH)  Interventions    Readmission Risk Interventions No flowsheet data found.

## 2019-11-24 NOTE — ED Provider Notes (Signed)
[facility] EMERGENCY DEPARTMENT Provider Note   CSN: [id number] Arrival date & time: 11/24/19  0441     History Chief Complaint  Patient presents with  . Unresponsive    Katelyn [name] is a 69 y.o. female.  69 year old female recently discharged in the hospital.  Apparently today her husband went to find her unresponsive.  EMS was called.  On arrival her oxygen saturation was in the 30s.  It sounds like her husband was unable to suction her.  He had been using saline flushes for her trach but apparently became clogged.  On EMS arrival they removed and put an ET tube and started bagging her get up to 97% brought here for further evaluation.  No other recent illnesses. No fevers.    Altered Mental Status Presenting symptoms: unresponsiveness   Severity:  Severe Most recent episode:  Today Episode history:  Single Timing:  Constant Progression:  Unchanged Chronicity:  New Context: not alcohol use   Associated symptoms: no abdominal pain and no fever        Past Medical History:  Diagnosis Date  . Acute gallstone pancreatitis 01/08/2014  . AKI (acute kidney injury) ([place]) 01/08/2014  . Anemia   . Blood transfusion    11'15 last admission  . Cancer ([place])   . Cholecystitis   . H/O tracheostomy   . Hip fracture ([place])   . Hypercholesteremia   . Hypertension   . Right leg DVT ([place]) 05/08/2014  . Septic shock - Kelbsiella 01/09/2014  . Shortness of breath   . Stroke [facility])    '13- right side weakness, ambulates with cane    Patient Active Problem List   Diagnosis Date Noted  . Acute on chronic respiratory failure with hypoxia and hypercapnia (HCC)   . Acute respiratory failure requiring reintubation ([place]) 11/11/2019  . Loose stools 11/06/2019  . Benign essential HTN   . Chronic obstructive pulmonary disease ([place])   . Debility 10/25/2019  . Acute hypoxemic respiratory failure ([place]) 10/21/2019  . Goals of care, counseling/discussion   . Palliative  care by specialist   . History of noncompliance with medical treatment   . COPD exacerbation ([place]) 10/06/2019  . Generalized weakness 07/12/2017  . Palliative care encounter 07/12/2017  . Cancer of hypopharynx ([place]) 06/24/2017  . Pressure injury of skin 05/30/2017  . Malnutrition of moderate degree 05/30/2017  . Hip fx, right, closed, initial encounter ([place]) 05/29/2017  . Closed displaced intertrochanteric fracture of right femur ([place]) 05/29/2017  . Dysphagia 05/29/2017  . Weight loss 05/29/2017  . Difficult airway for intubation 05/29/2017  . Lesion of epiglottis 05/26/2014  . Abnormal CT scan, pancreas or bile duct   . Right leg DVT ([place]) 05/08/2014  . Anemia 01/09/2014  . Hip pain, right 05/06/2011  . 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 CKD (chronic kidney disease) stage 3, GFR 30-59 ml/min (HCC) 03/04/2011  . Diastolic dysfunction 42/59/5638  . Tobacco abuse 03/04/2011  . Constipation 03/04/2011  . Malignant hypertensive urgency 03/01/2011  . Cerebral infarction ([place]) 03/01/2011  . NEUROPATHY, IDIOPATHIC 10/25/2007  . Hypercholesteremia 12/15/2006  . Essential hypertension 12/15/2006    Past Surgical History:  Procedure Laterality Date  . ABDOMINAL HYSTERECTOMY    . DIRECT LARYNGOSCOPY N/A 06/08/2017   Procedure: DIRECT LARYNGOSCOPY WITH BIOPSY;  Surgeon: Helayne Seminole, MD;  Location: [facility];  Service: ENT;  Laterality: N/A;  . ENDOSCOPIC RETROGRADE CHOLANGIOPANCREATOGRAPHY (ERCP) WITH PROPOFOL N/A 01/11/2014   Procedure: ENDOSCOPIC RETROGRADE CHOLANGIOPANCREATOGRAPHY (ERCP) WITH  PROPOFOL;  Surgeon: Gatha Mayer, MD;  Location: [facility] ENDOSCOPY;  Service: Endoscopy;  Laterality: N/A;  . ERCP    . ERCP N/A 05/26/2014   Procedure: ENDOSCOPIC RETROGRADE CHOLANGIOPANCREATOGRAPHY (ERCP);  Surgeon: Gatha Mayer, MD;  Location: [facility] ENDOSCOPY;  Service: Endoscopy;  Laterality: N/A;  . ESOPHAGOGASTRODUODENOSCOPY (EGD) WITH PROPOFOL N/A 12/07/2014   Procedure:  ESOPHAGOGASTRODUODENOSCOPY (EGD) WITH PROPOFOL;  Surgeon: Milus Banister, MD;  Location: [facility] ENDOSCOPY;  Service: Endoscopy;  Laterality: N/A;  . EUS N/A 12/07/2014   Procedure: UPPER ENDOSCOPIC ULTRASOUND (EUS) LINEAR;  Surgeon: Milus Banister, MD;  Location: [facility] ENDOSCOPY;  Service: Endoscopy;  Laterality: N/A;  . GASTROINTESTINAL STENT REMOVAL N/A 12/07/2014   Procedure: GASTROINTESTINAL STENT REMOVAL;  Surgeon: Milus Banister, MD;  Location: [facility] ENDOSCOPY;  Service: Endoscopy;  Laterality: N/A;  pancreatic stent removal  . HIP FRACTURE SURGERY    . INTRAMEDULLARY (IM) NAIL INTERTROCHANTERIC Right 05/29/2017   Procedure: INTRAMEDULLARY (IM) NAIL INTERTROCHANTRIC FEMUR;  Surgeon: Nicholes Stairs, MD;  Location: [facility];  Service: Orthopedics;  Laterality: Right;  . PERCUTANEOUS TRACHEOSTOMY N/A 05/29/2017   Procedure: PERCUTANEOUS TRACHEOSTOMY;  Surgeon: Judeth Horn, MD;  Location: [facility];  Service: General;  Laterality: N/A;  . TEE WITHOUT CARDIOVERSION  03/06/2011   Procedure: TRANSESOPHAGEAL ECHOCARDIOGRAM (TEE);  Surgeon: Jolaine Artist, MD;  Location: [facility] ENDOSCOPY;  Service: Cardiovascular;  Laterality: N/A;     OB History   No obstetric history on file.     Family History  Problem Relation Age of Onset  . Breast cancer Maternal Aunt   . Diabetes Mellitus I Father   . Diabetes Mellitus I Sister   . Anesthesia problems Neg Hx   . Hypotension Neg Hx   . Malignant hyperthermia Neg Hx   . Pseudochol deficiency Neg Hx   . Colon cancer Neg Hx   . Esophageal cancer Neg Hx   . Pancreatic cancer Neg Hx   . Kidney disease Neg Hx   . Liver disease Neg Hx     Social History   Tobacco Use  . Smoking status: Former Smoker    Packs/day: 0.25    Types: Cigarettes    Quit date: 05/29/2017    Years since quitting: 2.4  . Smokeless tobacco: Never Used  Substance Use Topics  . Alcohol use: No    Alcohol/week: 0.0 standard drinks  . Drug use: No    Home Medications Prior to  Admission medications   Medication Sig Start Date End Date Taking? Authorizing Provider  ALPRAZolam Duanne Moron) 0.5 MG tablet Take 1 tablet (0.5 mg total) by mouth 3 (three) times daily as needed for anxiety. 11/16/19   Little Ishikawa, MD  apixaban (ELIQUIS) 5 MG TABS tablet Take 1 tablet (5 mg total) by mouth 2 (two) times daily. 11/04/19   Love, Ivan Anchors, PA-C  atorvastatin (LIPITOR) 80 MG tablet Take 1 tablet (80 mg total) by mouth daily. Patient taking differently: Take 80 mg by mouth at bedtime.  10/18/19   Swayze, Ava, DO  guaiFENesin-dextromethorphan (ROBITUSSIN DM) 100-10 MG/5ML syrup Take 10 mLs by mouth every 6 (six) hours. 10/18/19   Swayze, Ava, DO  melatonin 3 MG TABS tablet Take 1 tablet (3 mg total) by mouth at bedtime as needed (insomnia). 11/04/19   Love, Ivan Anchors, PA-C  metoprolol tartrate (LOPRESSOR) 25 MG tablet Take 1 tablet (25 mg total) by mouth 2 (two) times daily. 11/04/19   Love, Ivan Anchors, PA-C  nystatin cream (MYCOSTATIN) Apply 1 application  topically 2 (two) times daily. 11/04/19   Love, Ivan Anchors, PA-C  polycarbophil (FIBERCON) 625 MG tablet Take 1 tablet (625 mg total) by mouth daily. 11/05/19   Bary Leriche, PA-C    Allergies    Patient has no known allergies.  Review of Systems   Review of Systems  Constitutional: Negative for fever.  Gastrointestinal: Negative for abdominal pain.  All other systems reviewed and are negative.   Physical Exam Updated Vital Signs BP 95/83   Pulse (!) 104   Temp 97.6 F (36.4 C) (Temporal)   Resp 16   Ht 4\' 11"  (1.499 m)   SpO2 98%   BMI 28.23 kg/m   Physical Exam Vitals and nursing note reviewed.  Constitutional:      Appearance: She is well-developed.  HENT:     Head: Normocephalic and atraumatic.     Mouth/Throat:     Mouth: Mucous membranes are moist.  Eyes:     Pupils: Pupils are equal, round, and reactive to light.  Cardiovascular:     Rate and Rhythm: Regular rhythm. Tachycardia present.  Pulmonary:      Effort: No respiratory distress.     Breath sounds: No stridor.     Comments: ett in trach stoma. Tachypneic. No breath sounds on left.  Abdominal:     General: There is no distension.     Tenderness: There is no abdominal tenderness.  Musculoskeletal:        General: No swelling or tenderness. Normal range of motion.     Cervical back: Normal range of motion.  Skin:    General: Skin is warm and dry.  Neurological:     General: No focal deficit present.     Mental Status: She is alert.     ED Results / Procedures / Treatments   Labs (all labs ordered are listed, but only abnormal results are displayed) Labs Reviewed  RESPIRATORY PANEL BY RT PCR (FLU A&B, COVID)  CBC WITH DIFFERENTIAL/PLATELET  BASIC METABOLIC PANEL    EKG None  Radiology DG Chest Portable 1 View  Result Date: 11/24/2019 CLINICAL DATA:  Patient was found agonal and unresponsive at home. Oxygen 37% upon arrival. EXAM: PORTABLE CHEST 1 VIEW COMPARISON:  11/17/2019 FINDINGS: Tracheostomy tube is in place. Heart size and pulmonary vascularity are normal. Prominence of the right inferior hilum likely represents mucous plugging as seen on prior CT of 04/12/2018. However this change could also represent mass or adenopathy. Slight fibrosis in the lung bases. No consolidation or airspace disease. No pleural effusions. No pneumothorax. Calcified and tortuous aorta. IMPRESSION: Prominence of the right inferior hilum could represent mucous plugging, mass or adenopathy. Atelectasis in the lung bases. No focal consolidation. Electronically Signed   By: Lucienne Capers M.D.   On: 11/24/2019 05:20    Procedures .Critical Care Performed by: Merrily Pew, MD Authorized by: Merrily Pew, MD   Critical care provider statement:    Critical care time (minutes):  45   Critical care was time spent personally by me on the following activities:  Discussions with consultants, evaluation of patient's response to treatment,  examination of patient, ordering and performing treatments and interventions, ordering and review of laboratory studies, ordering and review of radiographic studies, pulse oximetry, re-evaluation of patient's condition, obtaining history from patient or surrogate and review of old charts   (including critical care time)  Medications Ordered in ED Medications - No data to display  ED Course  I have reviewed the triage vital  signs and the nursing notes.  Pertinent labs & imaging results that were available during my care of the patient were reviewed by me and considered in my medical decision making (see chart for details).    MDM Rules/Calculators/A&P                         ETT replaced with trach. Put on ventilator for time being. Will get xr and reeval.   xr with likely mucous plug. Will ask RT to suction.  Stable on vent. Discussed with PCCM for admission.   Final Clinical Impression(s) / ED Diagnoses Final diagnoses:  Hypoxia    Rx / DC Orders ED Discharge Orders    None       Galadriel Shroff, Corene Cornea, MD 11/25/19 [phone]

## 2019-11-24 NOTE — H&P (Signed)
NAME:  Katelyn [name], MRN:  [id number], DOB:  Jan 02, 1951, LOS: 0 ADMISSION DATE:  11/24/2019, CONSULTATION DATE:  11/24/2019 REFERRING MD:  Dr. Dayna Barker, CHIEF COMPLAINT:  Acute respiratory distress    Brief History   69 year old female recently discharged from this health system 9/27 brought in for respiratory distress concerning for trach malfunction.  PCCM consulted for further management admission  History of present illness   Katelyn [name] is a 69 year old female with a past medical history significant for laryngeal cancer (diagnosed in 2015 but patient did not seek treatment til 2019) status post laryngectomy and tracheostomy, hypertension, hyperlipidemia, right leg DVT, and stroke who presented to the ED morning of 9/30 after being found unresponsive by husband.  Reportedly has been attempted to suction trach but was unable to.  On EMS arrival oxygen saturations were in the 30s.  EMS removed tracheostomy and intubated patient with ET tube through stoma with resolution of hypoxia seen.  Per review of discharge summary 9/27 it appears patient has been admitted multiple occasions in the month of August and September due to similar complaints of respiratory distress/hypoxia in the presence of a chronic trach/stoma. Most recent admission was from 9/17 to 9/23. Patient presented with acute onset SOB and hypoxia requiring endotracheal intubation thought stoma site. ETT was exchanged for #6 cuffed shiley trach 9/17. She was treated for MSSA pneumonia and discharged home with 6 shiley trach (unsure if trach was cuffed or cuffless) in place. Prior to discharge LTAC vs SNF discharge was discussed and patient and family agreed for discharge home.   On arrival patient was seen mildly tachycardic with all other vials WLN. She was placed on vent though ETT in tracheal stoma. Labwork significant for K 3.4, WBC 11.7, Hbg 9.2. ABG 7.51 / 36 / 396 / 29.2. CXR on admission with possible mucus plug no focal  consolidation. ED provider exchanged ETT with Trach. PCCM consulted for further management and admission.   Past Medical History  Laryngeal cancer (diagnosed in 2015 but patient did not seek treatment til 2019) status post laryngectomy and tracheostomy, hypertension, hyperlipidemia, right leg DVT, and stroke   Significant Hospital Events   Admitted 9/30.  Consults:  PCCM  Procedures:  9/30 Intubated thought trach with 6 ETT by EMS   Significant Diagnostic Tests:  CRX 9/30 > Prominence of the right inferior hilum could represent mucous plugging, mass or adenopathy. Atelectasis in the lung bases. No focal consolidation  Micro Data:  COVID 9/30  Antimicrobials:     Interim history/subjective:  Lying in bed requesting something to drink. States she feels much better   Objective   Blood pressure 99/73, pulse (!) 102, temperature 97.6 F (36.4 C), temperature source Temporal, resp. rate (!) 28, height 4\' 11"  (1.499 m), SpO2 94 %.    Vent Mode: PRVC FiO2 (%):  [40 %-100 %] 40 % Set Rate:  [16 bmp] 16 bmp Vt Set:  [350 mL] 350 mL PEEP:  [5 cmH20] 5 cmH20 Plateau Pressure:  [29 cmH20] 29 cmH20  No intake or output data in the 24 hours ending 11/24/19 0719 There were no vitals filed for this visit.  Examination: General: Chronically ill appearing mildly deconditioned elderly female on mechanical ventilation through trach lying in bed in NAD HEENT: 7.5 shiley trach midline , MM pink/moist, PERRL,  Neuro: Alert and oriented able to mouth words for communication  CV: s1s2 regular rate and rhythm, no murmur, rubs, or gallops,  PULM:  Clear to ascultation bilaterally, tolerating vent  well, no increased work of breathing  GI: soft, bowel sounds active in all 4 quadrants, non-tender, non-distended Extremities: warm/dry, no edema  Skin: no rashes or lesions   Resolved Hospital Problem list     Assessment & Plan:  Acute on Chronic Hypoxic Respiratory Failure  -Likely in the  setting of mucus plugging P: Continue ventilator support with lung protective strategies  Likely can wean to ATC no and if so will admit to progressive care Wean PEEP and FiO2 for sats greater than 90%. Head of bed elevated 30 degrees. Plateau pressures less than 30 cm H20 Follow intermittent chest x-ray and ABG Aggressive adequate pulmonary hygiene  May benefit from hypertonic saline nebs, will see how secretions appear once off vent  VAP bundle in place  PAD protocol  HX of laryngeal cancer S/P laryngectomy pT3pN0 supraglottic carcinoma  -Diagnosed in 2015 but did not seek treatment until 2019. Patient has been decanualted in the past but stoma remain.  -She was re-trach on last admission 9/17 with 6 cuffed shiley and it appears she was discharged with this trach in place  P: Will need to replace ETT with trach will discuss with attending if our team will preform vs ENT  Continue vent support as above Aggressive pulm hygiene  Will need to follow up as outpatient  Supportive care   Deconditioning POA P: PT/OT eval  Mobilize as able  Need to determine safe discharge disposition   Hx of HTN -Home medications include, Lopressor  P: Continue home beta blocker  Monitor vitals in the ICU  Hx of PE/DVT -Chonic anticoagulated on Eliquis  P: Continue home Eliquis   Prior CVA with right side weakness   Hyperlipidemia  P: Continue home statin and anticoagulant  Supportive care   Best practice:  Diet: NPO while on vent  Pain/Anxiety/Delirium protocol (if indicated): PRNs VAP protocol (if indicated): In place DVT prophylaxis: Eliquis  GI prophylaxis: PPI Glucose control: Monitor  Mobility: Up with assistance  Code Status: Full Family Communication: Updated Disposition: ICU   Labs   CBC: Recent Labs  Lab 11/24/19 0527 11/24/19 0615  WBC 11.7*  --   NEUTROABS 10.0*  --   HGB 9.2* 9.5*  HCT 30.8* 28.0*  MCV 97.8  --   PLT 209  --     Basic Metabolic  Panel: Recent Labs  Lab 11/24/19 0527 11/24/19 0615  NA 143 142  K 3.4* 3.8  CL 108  --   CO2 25  --   GLUCOSE 135*  --   BUN 15  --   CREATININE 0.89  --   CALCIUM 9.1  --    GFR: Estimated Creatinine Clearance: 47.6 mL/min (by C-G formula based on SCr of 0.89 mg/dL). Recent Labs  Lab 11/24/19 0527  WBC 11.7*    Liver Function Tests: No results for input(s): AST, ALT, ALKPHOS, BILITOT, PROT, ALBUMIN in the last 168 hours. No results for input(s): LIPASE, AMYLASE in the last 168 hours. No results for input(s): AMMONIA in the last 168 hours.  ABG    Component Value Date/Time   PHART 7.513 (H) 11/24/2019 0615   PCO2ART 36.2 11/24/2019 0615   PO2ART 396 (H) 11/24/2019 0615   HCO3 29.2 (H) 11/24/2019 0615   TCO2 30 11/24/2019 0615   ACIDBASEDEF 5.8 (H) 10/06/2019 1438   O2SAT 100.0 11/24/2019 0615     Coagulation Profile: No results for input(s): INR, PROTIME in the last 168 hours.  Cardiac Enzymes: No results for input(s): CKTOTAL,  CKMB, CKMBINDEX, TROPONINI in the last 168 hours.  HbA1C: Hgb A1c MFr Bld  Date/Time Value Ref Range Status  10/07/2019 04:13 AM 5.4 4.8 - 5.6 % Final    Comment:    (NOTE) Pre diabetes:          5.7%-6.4%  Diabetes:              >6.4%  Glycemic control for   <7.0% adults with diabetes   03/02/2011 06:02 AM 5.6 <5.7 % Final    Comment:    (NOTE)                                                                       According to the ADA Clinical Practice Recommendations for 2011, when HbA1c is used as a screening test:  >=6.5%   Diagnostic of Diabetes Mellitus           (if abnormal result is confirmed) 5.7-6.4%   Increased risk of developing Diabetes Mellitus References:Diagnosis and Classification of Diabetes Mellitus,Diabetes [id number],7622,63(FHLKT 1):S62-S69 and Standards of Medical Care in         Diabetes - 2011,Diabetes [id number],3893,73 (Suppl 1):S11-S61.    CBG: No results for input(s): GLUCAP in the last 168  hours.  Review of Systems:   Gen: Denies fever, chills, weight change, fatigue, night sweats HEENT: Denies blurred vision, double vision, hearing loss, tinnitus, sinus congestion, rhinorrhea, sore throat, neck stiffness, dysphagia PULM: Denies shortness of breath, cough, sputum production, hemoptysis, wheezing CV: Denies chest pain, edema, orthopnea, paroxysmal nocturnal dyspnea, palpitations GI: Denies abdominal pain, nausea, vomiting, diarrhea, hematochezia, melena, constipation, change in bowel habits GU: Denies dysuria, hematuria, polyuria, oliguria, urethral discharge Endocrine: Denies hot or cold intolerance, polyuria, polyphagia or appetite change Derm: Denies rash, dry skin, scaling or peeling skin change Heme: Denies easy bruising, bleeding, bleeding gums Neuro: Denies headache, numbness, weakness, slurred speech, loss of memory or consciousness  Past Medical History  She,  has a past medical history of Acute gallstone pancreatitis (01/08/2014), AKI (acute kidney injury) ([place]) (01/08/2014), Anemia, Blood transfusion, Cancer ([place]), Cholecystitis, H/O tracheostomy, Hip fracture ([place]), Hypercholesteremia, Hypertension, Right leg DVT ([place]) (05/08/2014), Septic shock - Kelbsiella (01/09/2014), Shortness of breath, and Stroke ([place]).   Surgical History    Past Surgical History:  Procedure Laterality Date  . ABDOMINAL HYSTERECTOMY    . DIRECT LARYNGOSCOPY N/A 06/08/2017   Procedure: DIRECT LARYNGOSCOPY WITH BIOPSY;  Surgeon: Helayne Seminole, MD;  Location: [facility] OR;  Service: ENT;  Laterality: N/A;  . ENDOSCOPIC RETROGRADE CHOLANGIOPANCREATOGRAPHY (ERCP) WITH PROPOFOL N/A 01/11/2014   Procedure: ENDOSCOPIC RETROGRADE CHOLANGIOPANCREATOGRAPHY (ERCP) WITH PROPOFOL;  Surgeon: Gatha Mayer, MD;  Location[place] [facility]  Service: Endoscopy;  Laterality: N/A;  . ERCP    . ERCP N/A 05/26/2014   Procedure: ENDOSCOPIC RETROGRADE CHOLANGIOPANCREATOGRAPHY (ERCP);  Surgeon: Gatha Mayer, MD;   Location: [facility];  Service: Endoscopy;  Laterality: N/A;  . ESOPHAGOGASTRODUODENOSCOPY (EGD) WITH PROPOFOL N/A 12/07/2014   Procedure: ESOPHAGOGASTRODUODENOSCOPY (EGD) WITH PROPOFOL;  Surgeon: Milus Banister, MD;  Location: [facility] ENDOSCOPY;  Service: Endoscopy;  Laterality: N/A;  . EUS N/A 12/07/2014   Procedure: UPPER ENDOSCOPIC ULTRASOUND (EUS) LINEAR;  Surgeon: Milus Banister, MD;  Location: [facility] ENDOSCOPY;  Service: Endoscopy;  Laterality: N/A;  .  GASTROINTESTINAL STENT REMOVAL N/A 12/07/2014   Procedure: GASTROINTESTINAL STENT REMOVAL;  Surgeon: Milus Banister, MD;  Location: [facility] ENDOSCOPY;  Service: Endoscopy;  Laterality: N/A;  pancreatic stent removal  . HIP FRACTURE SURGERY    . INTRAMEDULLARY (IM) NAIL INTERTROCHANTERIC Right 05/29/2017   Procedure: INTRAMEDULLARY (IM) NAIL INTERTROCHANTRIC FEMUR;  Surgeon: Nicholes Stairs, MD;  Location: [place];  Service: Orthopedics;  Laterality: Right;  . PERCUTANEOUS TRACHEOSTOMY N/A 05/29/2017   Procedure: PERCUTANEOUS TRACHEOSTOMY;  Surgeon: Judeth Horn, MD;  Location: [facility];  Service: General;  Laterality: N/A;  . TEE WITHOUT CARDIOVERSION  03/06/2011   Procedure: TRANSESOPHAGEAL ECHOCARDIOGRAM (TEE);  Surgeon: Jolaine Artist, MD;  Location: [facility] ENDOSCOPY;  Service: Cardiovascular;  Laterality: N/A;     Social History   reports that she quit smoking about 2 years ago. Her smoking use included cigarettes. She smoked 0.25 packs per day. She has never used smokeless tobacco. She reports that she does not drink alcohol and does not use drugs.   Family History   Her family history includes Breast cancer in her maternal aunt; Diabetes Mellitus I in her father and sister. There is no history of Anesthesia problems, Hypotension, Malignant hyperthermia, Pseudochol deficiency, Colon cancer, Esophageal cancer, Pancreatic cancer, Kidney disease, or Liver disease.   Allergies No Known Allergies   Home Medications  Prior to Admission medications    Medication Sig Start Date End Date Taking? Authorizing Provider  ALPRAZolam Duanne Moron) 0.5 MG tablet Take 1 tablet (0.5 mg total) by mouth 3 (three) times daily as needed for anxiety. 11/16/19   Little Ishikawa, MD  apixaban (ELIQUIS) 5 MG TABS tablet Take 1 tablet (5 mg total) by mouth 2 (two) times daily. 11/04/19   Love, Ivan Anchors, PA-C  atorvastatin (LIPITOR) 80 MG tablet Take 1 tablet (80 mg total) by mouth daily. Patient taking differently: Take 80 mg by mouth at bedtime.  10/18/19   Swayze, Ava, DO  guaiFENesin-dextromethorphan (ROBITUSSIN DM) 100-10 MG/5ML syrup Take 10 mLs by mouth every 6 (six) hours. 10/18/19   Swayze, Ava, DO  melatonin 3 MG TABS tablet Take 1 tablet (3 mg total) by mouth at bedtime as needed (insomnia). 11/04/19   Love, Ivan Anchors, PA-C  metoprolol tartrate (LOPRESSOR) 25 MG tablet Take 1 tablet (25 mg total) by mouth 2 (two) times daily. 11/04/19   Love, Ivan Anchors, PA-C  nystatin cream (MYCOSTATIN) Apply 1 application topically 2 (two) times daily. 11/04/19   Love, Ivan Anchors, PA-C  polycarbophil (FIBERCON) 625 MG tablet Take 1 tablet (625 mg total) by mouth daily. 11/05/19   Bary Leriche, PA-C     Critical care time:   CRITICAL CARE Performed by: Johnsie Cancel  Total critical care time: 40 minutes  Critical care time was exclusive of separately billable procedures and treating other patients.  Critical care was necessary to treat or prevent imminent or life-threatening deterioration.  Critical care was time spent personally by me on the following activities: development of treatment plan with patient and/or surrogate as well as nursing, discussions with consultants, evaluation of patient's response to treatment, examination of patient, obtaining history from patient or surrogate, ordering and performing treatments and interventions, ordering and review of laboratory studies, ordering and review of radiographic studies, pulse oximetry and re-evaluation of patient's  condition.  Johnsie Cancel, NP-C [facility] Contact / Pager information can be found on Amion  11/24/2019, 8:34 AM

## 2019-11-24 NOTE — Progress Notes (Signed)
RT called to trauma C stat for pt being bagged with ETT in trach site. RT continued to bag and attempted to suction pt. MD arrived and got the nurse to go get a new trach. MD inserted new trach without any complications and pt was placed on the vent. RT will continue to monitor.

## 2019-11-24 NOTE — Progress Notes (Signed)
Patient stated her breathing was better. Placed patient on 40% trach collar, will continue to monitor patient.

## 2019-11-24 NOTE — ED Triage Notes (Signed)
Pt comes via [facility] from home, pt was agonal and unresponsive at home, pt was discharged home without suction equipment, trach removed by EMS and ETT tube placed in stoma, oxygen was 37% upon EMS arrival.

## 2019-11-25 DIAGNOSIS — J9600 Acute respiratory failure, unspecified whether with hypoxia or hypercapnia: Secondary | ICD-10-CM

## 2019-11-25 LAB — BASIC METABOLIC PANEL
Anion gap: 6 (ref 5–15)
BUN: 16 mg/dL (ref 8–23)
CO2: 29 mmol/L (ref 22–32)
Calcium: 9.2 mg/dL (ref 8.9–10.3)
Chloride: 106 mmol/L (ref 98–111)
Creatinine, Ser: 0.97 mg/dL (ref 0.44–1.00)
GFR calc Af Amer: >60 mL/min (ref >60)
GFR calc non Af Amer: 59 mL/min — ABNORMAL LOW (ref >60)
Glucose, Bld: 97 mg/dL (ref 70–99)
Potassium: 3.7 mmol/L (ref 3.5–5.1)
Sodium: 141 mmol/L (ref 135–145)

## 2019-11-25 LAB — CBC
HCT: 28.3 % — ABNORMAL LOW (ref 36.0–46.0)
Hemoglobin: 8.2 g/dL — ABNORMAL LOW (ref 12.0–15.0)
MCH: 28.3 pg (ref 26.0–34.0)
MCHC: 29 g/dL — ABNORMAL LOW (ref 30.0–36.0)
MCV: 97.6 fL (ref 80.0–100.0)
Platelets: 187 10*3/uL (ref 150–400)
RBC: 2.9 MIL/uL — ABNORMAL LOW (ref 3.87–5.11)
RDW: 13.5 % (ref 11.5–15.5)
WBC: 6.8 10*3/uL (ref 4.0–10.5)
nRBC: 0 % (ref 0.0–0.2)

## 2019-11-25 LAB — MAGNESIUM: Magnesium: 2 mg/dL (ref 1.7–2.4)

## 2019-11-25 LAB — PHOSPHORUS: Phosphorus: 3.3 mg/dL (ref 2.5–4.6)

## 2019-11-25 MED ORDER — APIXABAN 5 MG PO TABS
5.0000 mg | ORAL_TABLET | Freq: Two times a day (BID) | ORAL | Status: DC
  Administered 2019-11-25 – 2019-11-26 (×3): 5 mg via ORAL
  Filled 2019-11-25 (×3): qty 1

## 2019-11-25 MED ORDER — PANTOPRAZOLE SODIUM 40 MG PO TBEC
40.0000 mg | DELAYED_RELEASE_TABLET | Freq: Every day | ORAL | Status: DC
  Administered 2019-11-25 – 2019-11-26 (×2): 40 mg via ORAL
  Filled 2019-11-25 (×2): qty 1

## 2019-11-25 MED ORDER — INFLUENZA VAC A&B SA ADJ QUAD 0.5 ML IM PRSY
0.5000 mL | PREFILLED_SYRINGE | INTRAMUSCULAR | Status: AC
  Administered 2019-11-26: 0.5 mL via INTRAMUSCULAR
  Filled 2019-11-25 (×2): qty 0.5

## 2019-11-25 MED ORDER — SODIUM CHLORIDE 3 % IN NEBU
4.0000 mL | INHALATION_SOLUTION | RESPIRATORY_TRACT | Status: DC | PRN
  Filled 2019-11-25: qty 4

## 2019-11-25 MED ORDER — DOCUSATE SODIUM 50 MG/5ML PO LIQD
100.0000 mg | Freq: Two times a day (BID) | ORAL | Status: DC | PRN
  Filled 2019-11-25: qty 10

## 2019-11-25 NOTE — Plan of Care (Signed)
  Problem: Clinical Measurements: Goal: Respiratory complications will improve Outcome: Progressing Note: Pt has been on trach collar since 2200 last night. Tolerating well.   Problem: Nutrition: Goal: Adequate nutrition will be maintained Outcome: Progressing   Problem: Elimination: Goal: Will not experience complications related to urinary retention Outcome: Progressing

## 2019-11-25 NOTE — Discharge Summary (Signed)
Physician Discharge Summary            Patient ID: Katelyn [name] MRN: [id number] DOB/AGE: 08-29-1950 69 y.o.  Admit date: 11/24/2019 Discharge date: 11/26/2019  Discharge Diagnoses:   Acute on chronic hypoxic respiratory failure in setting of mucus plugging HX of laryngeal cancer S/P laryngectomy pT3pN0 supraglottic carcinoma  Deconditioning POA HTN Hx PE/ DVT Prior CVA with right side weakness   Hyperlipidemia    Discharge Summary    69 year old female recently discharged from [facility] 9/27 brought in for respiratory distress concerning for trach malfunction.  Pt with past medical history significant for laryngeal cancer (diagnosed in 2015 but patient did not seek treatment til 2019) status post laryngectomy and tracheostomy, hypertension, hyperlipidemia, right leg DVT, and stroke who presented to the ED morning of 9/30 after being found unresponsive by husband.  Reportedly has been attempted to suction trach but was unable to.  On EMS arrival oxygen saturations were in the 30s.  EMS removed tracheostomy and intubated patient with ET tube through stoma with resolution of hypoxia.  On arrival patient was seen mildly tachycardic with all other vials WLN. She was placed on vent though ETT in tracheal stoma. Labwork significant for K 3.4, WBC 11.7, Hbg 9.2. ABG 7.51 / 36 / 396 / 29.2. CXR on admission with possible mucus plug no focal consolidation. ED provider exchanged ETT with Trach. PCCM consulted for further management and admission.   Pt was mechanically ventilated over night 9/30, treated with BD and aggressive pulmonary toilet. Suspect mucus plugging. Hypertonic saline was added prn. Pt. Has a strong cough, secretions are thin enough to easily clear her airway. She was successfully  extubated to [facility] 11/25/2019. She will be transferred to a Med Surg tele bed when one is available. Case Management and Social Work have been consulted.  Per family, the patient is discharged  home and refuses PT, home health nursing, and then she has issues with her trach. She has refused SNF after previous admissions.   Per review of discharge summary 9/27 it appears patient has been admitted multiple occasions in the month of August and September due to similar complaints of respiratory distress/hypoxia in the presence of a chronic trach/stoma. Most recent admission was from 9/17 to 9/23. Patient presented with acute onset SOB and hypoxia requiring endotracheal intubation thought stoma site. ETT was exchanged for #6 cuffed shiley trach 9/17. She was treated for MSSA pneumonia and discharged home with 6 shiley trach (unsure if trach was cuffed or cuffless) in place. Prior to discharge LTAC vs SNF discharge was discussed and patient and family agreed for discharge home.  The patient did not want to go.    Discharge Plan by Active Problems    Acute on Chronic Hypoxic Respiratory Failure  In setting of mucus plugging -O2 to support saturations >88% -trach clinic follow up in 8 weeks, message sent for scheduling  -pulmonary hygiene as able - mobilize, upright positioning    HX of laryngeal cancer S/P laryngectomy pT3pN0 supraglottic carcinoma  Diagnosed in 2015 but did not seek treatment until 2019. Patient has been decanualted in the past but stoma remains.  Re-trached on last admission 9/17 with 6 cuffed shiley and it appears she was discharged with this trach in place.  -trach changed to #6 flexible cuffless prior to discharge without difficulty (positive color change on end-tidal CO2), good bilateral breath sounds -sent patient home with 5 days of trach supplies per home care agency request to allow for set  up time  -patient has home O2 concentrator, nebulizer, ATC, portable suction -home trach suction ordered, discussed with home care agency -Home health PT, OT, RN arranged -DME for trach supplies  -she has had multiple admissions related to trach difficulties, concern she  should go to SNF but she refuses  Deconditioning POA -PT efforts as able, pt reports she uses a walker to get home  -home health PT, OT, RN   Hx of HTN -resume home lopressor  -follow up with PCP post discharge   Hx of PE/DVT -continue home Eliquis   Prior CVA with right side weakness   Hyperlipidemia  -continue home statin    Significant Hospital tests/ studies  CXR 9/30 > Prominence of the right inferior hilum could represent mucous plugging, mass or adenopathy. Atelectasis in the lung bases. No focal consolidation   Procedures   9/30 Intubated thought trach with 6 ETT by EMS > changed to #8 flexible Shiley trach in ER   Culture Data   COVID, Influenza A/B 9/30 >> negative       Discharge Exam: BP (!) 158/97 (BP Location: Left Arm)   Pulse 90   Temp 98.2 F (36.8 C)   Resp 19   Ht 4\' 11"  (1.499 m)   Wt 63.8 kg   SpO2 99%   BMI 28.41 kg/m   General: elderly adult female lying in bed on ATC in NAD HEENT: MM pink/moist, trach midline c/d/i  Neuro: AAOx4, communicates appropriately by mouthing words CV: s1s2 RRR, no m/r/g PULM: non-labored on 5L ATC, lungs bilaterally clear  GI: soft, bsx4 active  Extremities: warm/dry, no edema  Skin: no rashes or lesions   Labs at discharge   Lab Results  Component Value Date   CREATININE 0.95 11/26/2019   BUN 14 11/26/2019   NA 143 11/26/2019   K 4.1 11/26/2019   CL 105 11/26/2019   CO2 29 11/26/2019   Lab Results  Component Value Date   WBC 7.6 11/26/2019   HGB 9.0 (L) 11/26/2019   HCT 30.2 (L) 11/26/2019   MCV 98.4 11/26/2019   PLT 214 11/26/2019   Lab Results  Component Value Date   ALT 26 11/11/2019   AST 32 11/11/2019   ALKPHOS 97 11/11/2019   BILITOT 0.8 11/11/2019   Lab Results  Component Value Date   INR 1.2 11/11/2019   INR 1.15 05/29/2017   INR 1.14 05/29/2017    Current radiological studies    No results found.  Disposition:     Discharge disposition: 01-Home or Self  Care       Discharge Instructions    Call MD for:  difficulty breathing, headache or visual disturbances   Complete by: As directed    Call MD for:  extreme fatigue   Complete by: As directed    Call MD for:  persistant dizziness or light-headedness   Complete by: As directed    Call MD for:  persistant nausea and vomiting   Complete by: As directed    Call MD for:  redness, tenderness, or signs of infection (pain, swelling, redness, odor or green/yellow discharge around incision site)   Complete by: As directed    Call MD for:  severe uncontrolled pain   Complete by: As directed    Call MD for:  temperature >100.4   Complete by: As directed    Diet - low sodium heart healthy   Complete by: As directed    Discharge instructions   Complete by: As  directed    1.  Follow up with Dr. Erin Fulling at the pulmonary office as arranged 2.  The trach clinic will call you with an appointment for follow up  3.  Clean your trach daily with inner cannula change 4.  Oxygen by aerosolized trach collar at 5L  5.  Call 911 immediately if you have significant shortness of breath / difficulty breathing or chest pain 6. Review your medications carefully   Increase activity slowly   Complete by: As directed       Allergies as of 11/26/2019   No Known Allergies     Medication List    TAKE these medications   ALPRAZolam 0.5 MG tablet Commonly known as: XANAX Take 1 tablet (0.5 mg total) by mouth 3 (three) times daily as needed for anxiety.   apixaban 5 MG Tabs tablet Commonly known as: ELIQUIS Take 1 tablet (5 mg total) by mouth 2 (two) times daily.   atorvastatin 80 MG tablet Commonly known as: LIPITOR Take 1 tablet (80 mg total) by mouth daily.   guaiFENesin-dextromethorphan 100-10 MG/5ML syrup Commonly known as: ROBITUSSIN DM Take 10 mLs by mouth every 6 (six) hours.   melatonin 3 MG Tabs tablet Take 1 tablet (3 mg total) by mouth at bedtime as needed (insomnia).   metoprolol  tartrate 25 MG tablet Commonly known as: LOPRESSOR Take 1 tablet (25 mg total) by mouth 2 (two) times daily.   multivitamin with minerals tablet Take 1 tablet by mouth daily.   nystatin cream Commonly known as: MYCOSTATIN Apply 1 application topically 2 (two) times daily.   polycarbophil 625 MG tablet Commonly known as: FIBERCON Take 1 tablet (625 mg total) by mouth daily.            Durable Medical Equipment  (From admission, onward)         Start     Ordered   11/26/19 1721  For home use only DME Trach supplies  Once       Question Answer Comment  Trach Type Shiley   Size #6   Cuffed or Uncuffed Uncuffed   Fenestrated No   Does patient need speaking valve? No   Trach Humidification Yes   If Oxygen Bleed In (lpm) 28% / 5L   Suction Needed? Yes   Manual Resuscitator Needed? Yes   Emergency Backup Trach size (one size smaller than trach in throat) #4      11/26/19 1721   11/26/19 1546  For home use only DME Other see comment  Once       Comments: #14 French Suction catheter for trach.  Please provide 15 catheters.  Question:  Length of Need  Answer:  12 Months   11/26/19 1546           Follow-up appointment   Pulmonary Office Dr. Erin Fulling 12/15/2019 @ 10:30 am, please come 15 minutes early for check in  [facility] will call you for a follow up appointment   Discharge Condition:   Medically stable for discharge / good.     Time spent on discharge: Greater than 35 minutes   Signed: Noe Gens, MSN, NP-C, AGACNP-BC  Pulmonary & Critical Care 11/26/2019, 5:29 PM   Please see [email or web address] for pager details.

## 2019-11-25 NOTE — Progress Notes (Addendum)
NAME:  Katelyn [name], MRN:  [id number], DOB:  [birth year], LOS: 1 ADMISSION DATE:  11/24/2019, CONSULTATION DATE:  11/24/2019 REFERRING MD:  Dr. Dayna Barker, CHIEF COMPLAINT:  Acute respiratory distress    Brief History   69 year old female recently discharged from this health system 9/27 brought in for respiratory distress concerning for trach malfunction.  PCCM consulted for further management admission  History of present illness   Katelyn [name] is a 69 year old female with a past medical history significant for laryngeal cancer (diagnosed in 2015 but patient did not seek treatment til 2019) status post laryngectomy and tracheostomy, hypertension, hyperlipidemia, right leg DVT, and stroke who presented to the ED morning of 9/30 after being found unresponsive by husband.  Reportedly has been attempted to suction trach but was unable to.  On EMS arrival oxygen saturations were in the 30s.  EMS removed tracheostomy and intubated patient with ET tube through stoma with resolution of hypoxia seen.  Per review of discharge summary 9/27 it appears patient has been admitted multiple occasions in the month of August and September due to similar complaints of respiratory distress/hypoxia in the presence of a chronic trach/stoma. Most recent admission was from 9/17 to 9/23. Patient presented with acute onset SOB and hypoxia requiring endotracheal intubation thought stoma site. ETT was exchanged for #6 cuffed shiley trach 9/17. She was treated for MSSA pneumonia and discharged home with 6 shiley trach (unsure if trach was cuffed or cuffless) in place. Prior to discharge LTAC vs SNF discharge was discussed and patient and family agreed for discharge home.   On arrival patient was seen mildly tachycardic with all other vials [facility]. She was placed on vent though ETT in tracheal stoma. Labwork significant for K 3.4, WBC 11.7, Hbg 9.2. ABG 7.51 / 36 / 396 / 29.2. CXR on admission with possible mucus plug no focal  consolidation. ED provider exchanged ETT with Trach. PCCM consulted for further management and admission.   Past Medical History  Laryngeal cancer (diagnosed in 2015 but patient did not seek treatment til 2019) status post laryngectomy and tracheostomy, hypertension, hyperlipidemia, right leg DVT, and stroke   Significant Hospital Events   Admitted 9/30.  Consults:  PCCM  Procedures:  9/30 Intubated thought trach with 6 ETT by EMS   Significant Diagnostic Tests:  CRX 9/30 > Prominence of the right inferior hilum could represent mucous plugging, mass or adenopathy. Atelectasis in the lung bases. No focal consolidation  Micro Data:  COVID 9/30  Antimicrobials:     Interim history/subjective:  Awake and alert on 35% ATC, Sats are 100%, strong cough, fail amount of secretions, blood tinged, asking when she can go home WBC 6.8, platelets 187,000, T Max 98.3  Objective   Blood pressure (!) 119/100, pulse 75, temperature 97.9 F (36.6 C), temperature source Axillary, resp. rate (!) 22, height 4\' 11"  (1.499 m), weight 63.8 kg, SpO2 97 %.    Vent Mode: PRVC FiO2 (%):  [35 %-40 %] 35 % Set Rate:  [16 bmp] 16 bmp Vt Set:  [340 mL] 340 mL PEEP:  [5 cmH20] 5 cmH20 Plateau Pressure:  [18 cmH20-21 cmH20] 18 cmH20   Intake/Output Summary (Last 24 hours) at 11/25/2019 6378 Last data filed at 11/25/2019 0600 Gross per 24 hour  Intake 120 ml  Output 200 ml  Net -80 ml   Filed Weights   11/25/19 0500  Weight: 63.8 kg    Examination: General: Chronically ill appearing mildly deconditioned elderly female on mechanical ventilation through trach  lying in bed in NAD HEENT: 7.5 shiley trach midline , MM pink/moist, PERRL,  Neuro: Alert and oriented able to mouth words for communication  CV: s1s2 regular rate and rhythm, no murmur, rubs, or gallops,  PULM:  Clear to ascultation bilaterally, tolerating vent well, no increased work of breathing  GI: soft, bowel sounds active in all 4  quadrants, non-tender, non-distended Extremities: warm/dry, no edema  Skin: no rashes or lesions   Resolved Hospital Problem list     Assessment & Plan:  Acute on Chronic Hypoxic Respiratory Failure  -Likely in the setting of mucus plugging - Now on ATC 35% with excellent sats, strong cough - Atelectasis per CXR 9/30 P: Saturation goals are > 88% Head of bed elevated 30 degrees. Follow intermittent chest x-ray and ABG prn Aggressive adequate pulmonary hygiene OOB to chair  Hypertonic saline nebs prn   HX of laryngeal cancer S/P laryngectomy pT3pN0 supraglottic carcinoma  -Diagnosed in 2015 but did not seek treatment until 2019. Patient has been decanualted in the past but stoma remain.  -She was re-trach on last admission 9/17 with 6 cuffed shiley and it appears she was discharged with this trach in place  P: Will need to replace ETT with trach will discuss with attending if our team will preform vs ENT  Aggressive pulm hygiene  Will need to follow up as outpatient  Supportive care >> will consult case worked and social work to ensure home situation has all equipment. Appropriate teaching has been done with care givers  Deconditioning POA P: PT/OT eval  Mobilize as able  Need to determine safe discharge disposition >>will consult case worked and social work to ensure home situation has all equipment.  Appropriate teaching has been done with care givers  Hx of HTN -Home medications include, Lopressor  P: Continue home beta blocker  Monitor vitals in the ICU  Hx of PE/DVT -Chonic anticoagulated on Eliquis  P: Continue home Eliquis   Prior CVA with right side weakness   Hyperlipidemia  P: Continue home statin and anticoagulant  Supportive care   Pt should not be discharged home until all equipment for trach management is at home and patient and care givers can demonstrate use.  Pt. Should have Luana follow up closely at discharge  Best practice:    Diet: Heart smart diet Pain/Anxiety/Delirium protocol (if indicated): prn xanax VAP protocol (if indicated): In place DVT prophylaxis: Eliquis  GI prophylaxis: PPI Glucose control: Monitor  Mobility: Up with assistance  Code Status: Full Family Communication: Updated Disposition: ICU   Per daughter, patient has been refusing home PT and home nursing. I had a long discussion with her and explained that we cannot continue sending her home if she will not accept the PT and RN assistance in her home. Being at home with a trach can be complicated. We will give her this one last change to go home. If she bounces back again she will need to go to SNF, which she has refused up to this point. Pt. Verbalized agreement to utilize help at home , and to no longer refuse PT at home. She understands additional discharges will be made to SNF after any further admissions for the same issue.   Labs   CBC: Recent Labs  Lab 11/24/19 0527 11/24/19 0615 11/25/19 0129  WBC 11.7*  --  6.8  NEUTROABS 10.0*  --   --   HGB 9.2* 9.5* 8.2*  HCT 30.8* 28.0* 28.3*  MCV 97.8  --  97.6  PLT 209  --  702    Basic Metabolic Panel: Recent Labs  Lab 11/24/19 0527 11/24/19 0615 11/25/19 0129  NA 143 142 141  K 3.4* 3.8 3.7  CL 108  --  106  CO2 25  --  29  GLUCOSE 135*  --  97  BUN 15  --  16  CREATININE 0.89  --  0.97  CALCIUM 9.1  --  9.2  MG  --   --  2.0  PHOS  --   --  3.3   GFR: Estimated Creatinine Clearance: 43.8 mL/min (by C-G formula based on SCr of 0.97 mg/dL). Recent Labs  Lab 11/24/19 0527 11/25/19 0129  WBC 11.7* 6.8    Liver Function Tests: No results for input(s): AST, ALT, ALKPHOS, BILITOT, PROT, ALBUMIN in the last 168 hours. No results for input(s): LIPASE, AMYLASE in the last 168 hours. No results for input(s): AMMONIA in the last 168 hours.  ABG    Component Value Date/Time   PHART 7.513 (H) 11/24/2019 0615   PCO2ART 36.2 11/24/2019 0615   PO2ART 396 (H) 11/24/2019  0615   HCO3 29.2 (H) 11/24/2019 0615   TCO2 30 11/24/2019 0615   ACIDBASEDEF 5.8 (H) 10/06/2019 1438   O2SAT 100.0 11/24/2019 0615     Coagulation Profile: No results for input(s): INR, PROTIME in the last 168 hours.  Cardiac Enzymes: No results for input(s): CKTOTAL, CKMB, CKMBINDEX, TROPONINI in the last 168 hours.  HbA1C: Hgb A1c MFr Bld  Date/Time Value Ref Range Status  10/07/2019 04:13 AM 5.4 4.8 - 5.6 % Final    Comment:    (NOTE) Pre diabetes:          5.7%-6.4%  Diabetes:              >6.4%  Glycemic control for   <7.0% adults with diabetes   03/02/2011 06:02 AM 5.6 <5.7 % Final    Comment:    (NOTE)                                                                       According to the ADA Clinical Practice Recommendations for 2011, when HbA1c is used as a screening test:  >=6.5%   Diagnostic of Diabetes Mellitus           (if abnormal result is confirmed) 5.7-6.4%   Increased risk of developing Diabetes Mellitus References:Diagnosis and Classification of Diabetes Mellitus,Diabetes OVZC,5885,02(DXAJO 1):S62-S69 and Standards of Medical Care in         Diabetes - 2011,Diabetes INOM,7672,09 (Suppl 1):S11-S61.    CBG: Recent Labs  Lab 11/24/19 1141  GLUCAP 88     Past Medical History  She,  has a past medical history of Acute gallstone pancreatitis (01/08/2014), AKI (acute kidney injury) ([place]) (01/08/2014), Anemia, Blood transfusion, Cancer ([place]), Cholecystitis, H/O tracheostomy, Hip fracture ([place]), Hypercholesteremia, Hypertension, Right leg DVT ([place]) (05/08/2014), Septic shock - Kelbsiella (01/09/2014), Shortness of breath, and Stroke ([place]).   Surgical History    Past Surgical History:  Procedure Laterality Date  . ABDOMINAL HYSTERECTOMY    . DIRECT LARYNGOSCOPY N/A 06/08/2017   Procedure: DIRECT LARYNGOSCOPY WITH BIOPSY;  Surgeon: Helayne Seminole, MD;  Location: [facility];  Service: ENT;  Laterality: N/A;  . ENDOSCOPIC RETROGRADE  CHOLANGIOPANCREATOGRAPHY (ERCP) WITH PROPOFOL N/A 01/11/2014   Procedure: ENDOSCOPIC RETROGRADE CHOLANGIOPANCREATOGRAPHY (ERCP) WITH PROPOFOL;  Surgeon: Gatha Mayer, MD;  Location: [facility];  Service: Endoscopy;  Laterality: N/A;  . ERCP    . ERCP N/A 05/26/2014   Procedure: ENDOSCOPIC RETROGRADE CHOLANGIOPANCREATOGRAPHY (ERCP);  Surgeon: Gatha Mayer, MD;  Location: [facility];  Service: Endoscopy;  Laterality: N/A;  . ESOPHAGOGASTRODUODENOSCOPY (EGD) WITH PROPOFOL N/A 12/07/2014   Procedure: ESOPHAGOGASTRODUODENOSCOPY (EGD) WITH PROPOFOL;  Surgeon: Milus Banister, MD;  [facility];  Service: Endoscopy;  Laterality: N/A;  . EUS N/A 12/07/2014   Procedure: UPPER ENDOSCOPIC ULTRASOUND (EUS) LINEAR;  Surgeon: Milus Banister, MD;  Location[facility];  Service: Endoscopy;  Laterality: N/A;  . GASTROINTESTINAL STENT REMOVAL N/A 12/07/2014   Procedure: GASTROINTESTINAL STENT REMOVAL;  Surgeon: Milus Banister, MD;  [facility];  Service: Endoscopy;  Laterality: N/A;  pancreatic stent removal  . HIP FRACTURE SURGERY    . INTRAMEDULLARY (IM) NAIL INTERTROCHANTERIC Right 05/29/2017   Procedure: INTRAMEDULLARY (IM) NAIL INTERTROCHANTRIC FEMUR;  Surgeon: Nicholes Stairs, MD;  Location: [facility];  Service: Orthopedics;  Laterality: Right;  . PERCUTANEOUS TRACHEOSTOMY N/A 05/29/2017   Procedure: PERCUTANEOUS TRACHEOSTOMY;  Surgeon: Judeth Horn, MD;  Location: [facility];  Service: General;  Laterality: N/A;  . TEE WITHOUT CARDIOVERSION  03/06/2011   Procedure: TRANSESOPHAGEAL ECHOCARDIOGRAM (TEE);  Surgeon: Jolaine Artist, MD;  Location: [facility] END[facility]COPY;  Service: Cardiovascular;  Laterality: N/A;     Social History   reports that she quit smoking about 2 years ago. Her smoking use included cigarettes. She smoked 0.25 packs per day. She has never used smokeless tobacco. She reports that she does not drink alcohol and does not use drugs.   Family History   Her family history  includes Breast cancer in her maternal aunt; Diabetes Mellitus I in her father and sister. There is no history of Anesthesia problems, Hypotension, Malignant hyperthermia, Pseudochol deficiency, Colon cancer, Esophageal cancer, Pancreatic cancer, Kidney disease, or Liver disease.   Allergies No Known Allergies   Home Medications  Prior to Admission medications   Medication Sig Start Date End Date Taking? Authorizing Provider  ALPRAZolam Duanne Moron) 0.5 MG tablet Take 1 tablet (0.5 mg total) by mouth 3 (three) times daily as needed for anxiety. 11/16/19   Little Ishikawa, MD  apixaban (ELIQUIS) 5 MG TABS tablet Take 1 tablet (5 mg total) by mouth 2 (two) times daily. 11/04/19   Love, Ivan Anchors, PA-C  atorvastatin (LIPITOR) 80 MG tablet Take 1 tablet (80 mg total) by mouth daily. Patient taking differently: Take 80 mg by mouth at bedtime.  10/18/19   Swayze, Ava, DO  guaiFENesin-dextromethorphan (ROBITUSSIN DM) 100-10 MG/5ML syrup Take 10 mLs by mouth every 6 (six) hours. 10/18/19   Swayze, Ava, DO  melatonin 3 MG TABS tablet Take 1 tablet (3 mg total) by mouth at bedtime as needed (insomnia). 11/04/19   Love, Ivan Anchors, PA-C  metoprolol tartrate (LOPRESSOR) 25 MG tablet Take 1 tablet (25 mg total) by mouth 2 (two) times daily. 11/04/19   Love, Ivan Anchors, PA-C  nystatin cream (MYCOSTATIN) Apply 1 application topically 2 (two) times daily. 11/04/19   Love, Ivan Anchors, PA-C  polycarbophil (FIBERCON) 625 MG tablet Take 1 tablet (625 mg total) by mouth daily. 11/05/19   Bary Leriche, PA-C     Critical care time:   CRITICAL CARE Performed by: [name]  Total critical  care time: 32 minutes  Critical care time was exclusive of separately billable procedures and treating other patients.  Critical care was necessary to treat or prevent imminent or life-threatening deterioration.  Critical care was time spent personally by me on the following activities: development of treatment plan with patient and/or  surrogate as well as nursing, discussions with consultants, evaluation of patient's response to treatment, examination of patient, obtaining history from patient or surrogate, ordering and performing treatments and interventions, ordering and review of laboratory studies, ordering and review of radiographic studies, pulse oximetry and re-evaluation of patient's condition.  [name], MSN, AGACNP-BC [place] for personal pager PCCM on call pager [phone] 11/25/2019, 8:08 AM   ______________________________________________________________ Attending Attestation:   I agree with the above evaluation, recommendations and plan per Eric Form, NP.    Freda Jackson, MD [facility] Office: [phone]   See Amion for Pager Details

## 2019-11-25 NOTE — TOC Progression Note (Signed)
Transition of Care [facility]) - Progression Note    Patient Details  Name: Katelyn [name] MRN: [id number] Date of Birth: 02-Jul-1950  Transition of Care [facility]) CM/SW [place], RN Phone Number: 11/25/2019, 2:29 PM  Clinical Narrative:     Patient has hospital bed and suction at home. Was with [facility] Home [facility] when discharged 4 days ago.  Now in ICU, with hypoxia. back on trach collar.  With frequent readmissions, should bridge from hospital to SNF, prior to going back home, however, patient and family declined this previously. Will follow up as patient continues to progress.   Expected Discharge Plan: [place] Barriers to Discharge: Continued Medical Work up  Expected Discharge Plan and Services Expected Discharge Plan: [place] In-house Referral: Clinical Social Work Discharge Planning Services: CM Consult   Living arrangements for the past 2 months: Single Family Home                                       Social Determinants of Health (SDOH) Interventions    Readmission Risk Interventions No flowsheet data found.

## 2019-11-25 NOTE — Progress Notes (Deleted)
Pt. With MAP < 65. Pressors restarted No obvious bleeding, will check CBC now. Transfer to Progressive bed has been cancelled ( Pressors, ? Acute bleed)  [name], MSN, AGACNP-BC [place] for personal pager PCCM on call pager [phone]

## 2019-11-25 NOTE — Progress Notes (Signed)
Daughter [name] made aware of patients transfer to [phone]

## 2019-11-25 NOTE — TOC Progression Note (Signed)
Transition of Care [facility]) - Progression Note    Patient Details  Name: Katelyn [name] MRN: [id number] Date of Birth: [birth year]  Transition of Care [facility]) CM/SW [place], RN Phone Number: 11/25/2019, 3:37 PM  Clinical Narrative:     [facility]  not willing to take patient back for Home health due to patient needs beyond their capacity  At this time. .   Expected Discharge Plan: Skilled Nursing Facility Barriers to Discharge: Continued Medical Work up  Expected Discharge Plan and Services Expected Discharge Plan: [place] In-house Referral: Clinical Social Work Discharge Planning Services: CM Consult   Living arrangements for the past 2 months: Single Family Home                                       Social Determinants of Health (SDOH) Interventions    Readmission Risk Interventions No flowsheet data found.

## 2019-11-25 NOTE — Evaluation (Signed)
Physical Therapy Evaluation Patient Details Name: Katelyn [name] MRN: [id number] DOB: [birth year] Today's Date: 11/25/2019   History of Present Illness  Katelyn [name] is a 69 year old female with a past medical history significant for laryngeal cancer (diagnosed in 2015 but patient did not seek treatment til 2019) status post laryngectomy and tracheostomy, hypertension, hyperlipidemia, right leg DVT, and stroke who presented to the ED morning of 9/30 after being found unresponsive by husband.  Suctioning has reportedly has been attempted without success.  On arrival with EMS, oxygen saturations were in the 30s.  EMS removed tracheostomy and intubated patient with ET tube through stoma with resolution of hypoxia seen..  Clinical Impression  Pt admitted with/for being found unresponsive with very low SpO2 in the 30's.  Pt needing min to mod assist for basic mobility.  Pt currently limited functionally due to the problems listed below.  (see problems list.)  Pt will benefit from PT to maximize function and safety to be able to get home safely with available assist.     Follow Up Recommendations Home health PT;Supervision/Assistance - 24 hour (unless pt doesn't have 24/7 assist that can help manage)    Equipment Recommendations  Other (comment) (TBD)    Recommendations for Other Services       Precautions / Restrictions Precautions Precautions: Fall Precaution Comments: Trach      Mobility  Bed Mobility Overal bed mobility: Needs Assistance Bed Mobility: Supine to Sit     Supine to sit: Mod assist     General bed mobility comments: assist up and forward to sitting, pt attempted scooting with UE's, but still needed minimal scooting assist  Transfers Overall transfer level: Needs assistance Equipment used: None Transfers: Sit to/from Stand Sit to Stand: Min assist Stand pivot transfers: Mod assist       General transfer comment: tentative mildly unsteady pivotal steps to  the recliner.  Ambulation/Gait             General Gait Details: NT  Stairs            Wheelchair Mobility    Modified Rankin (Stroke Patients Only)       Balance Overall balance assessment: Needs assistance   Sitting balance-Leahy Scale: Fair Sitting balance - Comments: prefers UE assist but can maintain balance at EOB without UE assist     Standing balance-Leahy Scale: Poor Standing balance comment: reliant on externa support                             Pertinent Vitals/Pain Pain Assessment: Faces Faces Pain Scale: No hurt Pain Intervention(s): Monitored during session    Home Living Family/patient expects to be discharged to:: Private residence Living Arrangements: Spouse/significant other Available Help at Discharge: Family;Available 24 hours/day Type of Home: House Home Access: Ramped entrance     Home Layout: One level Home Equipment: Clinical cytogeneticist - 4 wheels;Cane - single point;Toilet riser;Bedside commode Additional Comments: Family frequently checking in on pt, fiance retired and many family members can physically assist    Prior Function Level of Independence: Independent with assistive device(s)         Comments: ambulatory with RW around home. Reports able to complete ADLs without assist ( per admission from 2 weeks ago)     Hand Dominance   Dominant Hand: Right    Extremity/Trunk Assessment   Upper Extremity Assessment Upper Extremity Assessment: Defer to OT evaluation    Lower Extremity Assessment Lower  Extremity Assessment: Generalized weakness       Communication   Communication: Tracheostomy  Cognition Arousal/Alertness: Awake/alert Behavior During Therapy: WFL for tasks assessed/performed Overall Cognitive Status: Difficult to assess                                        General Comments General comments (skin integrity, edema, etc.): vss on 40% TC    Exercises      Assessment/Plan    PT Assessment Patient needs continued PT services  PT Problem List Decreased strength;Decreased mobility       PT Treatment Interventions DME instruction;Gait training;Therapeutic activities;Therapeutic exercise;Functional mobility training;Balance training;Patient/family education    PT Goals (Current goals can be found in the Care Plan section)  Acute Rehab PT Goals Patient Stated Goal: none stated PT Goal Formulation: Patient unable to participate in goal setting Time For Goal Achievement: 11/29/19 Potential to Achieve Goals: Fair    Frequency Min 3X/week   Barriers to discharge        Co-evaluation               AM-PAC PT "6 Clicks" Mobility  Outcome Measure Help needed turning from your back to your side while in a flat bed without using bedrails?: A Lot Help needed moving from lying on your back to sitting on the side of a flat bed without using bedrails?: A Lot Help needed moving to and from a bed to a chair (including a wheelchair)?: A Little Help needed standing up from a chair using your arms (e.g., wheelchair or bedside chair)?: A Little Help needed to walk in hospital room?: A Lot Help needed climbing 3-5 steps with a railing? : Total 6 Click Score: 13    End of Session Equipment Utilized During Treatment: Oxygen Activity Tolerance: Patient tolerated treatment well Patient left: in chair;with call bell/phone within reach Nurse Communication: Mobility status PT Visit Diagnosis: Other abnormalities of gait and mobility (R26.89);Muscle weakness (generalized) (M62.81);Difficulty in walking, not elsewhere classified (R26.2)    Time: 4008-6761 PT Time Calculation (min) (ACUTE ONLY): 25 min   Charges:   PT Evaluation $PT Eval Moderate Complexity: 1 Mod PT Treatments $Therapeutic Activity: 8-22 mins        11/25/2019  Ginger Carne., PT Acute Rehabilitation Services [phone]  (pager) [phone]  (office)  [name]  Katelyn [name] 11/25/2019, 2:46 PM

## 2019-11-25 NOTE — TOC Progression Note (Signed)
Transition of Care [facility] - Progression Note    Patient Details  Name: Katelyn [name] MRN: [id number] Date of Birth: [birth year]  [facility]) CM/SW [place], RN Phone Number: 11/25/2019, 3:49 PM  Clinical Narrative:    High risk readmission assessment complete.  Case manager called [name] granddaughter to discuss referral for home health agencies.  Discussed concerns about readmission and assessed home situation.  [name] discussed that patient lives with boyfriend, daughter picks up medications and family assists with patient needs.  Patient has tracheostomy and uses oxygen supplies at home, no other DME's at home.  Verified address.  Discussed living situation with boyfriend and patient to make sure that home health is able to come into house with no issues.  Will continue to monitor for additional needs.   Expected Discharge Plan: [place] Barriers to Discharge: Continued Medical Work up  Expected Discharge Plan and Services Expected Discharge Plan: [place] In-house Referral: Clinical Social Work Discharge Planning Services: CM Consult   Living arrangements for the past 2 months: Single Family Home                                       Social Determinants of Health (SDOH) Interventions    Readmission Risk Interventions No flowsheet data found.

## 2019-11-26 DIAGNOSIS — R0902 Hypoxemia: Secondary | ICD-10-CM

## 2019-11-26 LAB — BASIC METABOLIC PANEL
Anion gap: 9 (ref 5–15)
BUN: 14 mg/dL (ref 8–23)
CO2: 29 mmol/L (ref 22–32)
Calcium: 9.4 mg/dL (ref 8.9–10.3)
Chloride: 105 mmol/L (ref 98–111)
Creatinine, Ser: 0.95 mg/dL (ref 0.44–1.00)
GFR calc Af Amer: >60 mL/min (ref >60)
GFR calc non Af Amer: >60 mL/min (ref >60)
Glucose, Bld: 96 mg/dL (ref 70–99)
Potassium: 4.1 mmol/L (ref 3.5–5.1)
Sodium: 143 mmol/L (ref 135–145)

## 2019-11-26 LAB — CBC
HCT: 30.2 % — ABNORMAL LOW (ref 36.0–46.0)
Hemoglobin: 9 g/dL — ABNORMAL LOW (ref 12.0–15.0)
MCH: 29.3 pg (ref 26.0–34.0)
MCHC: 29.8 g/dL — ABNORMAL LOW (ref 30.0–36.0)
MCV: 98.4 fL (ref 80.0–100.0)
Platelets: 214 10*3/uL (ref 150–400)
RBC: 3.07 MIL/uL — ABNORMAL LOW (ref 3.87–5.11)
RDW: 13.4 % (ref 11.5–15.5)
WBC: 7.6 10*3/uL (ref 4.0–10.5)
nRBC: 0 % (ref 0.0–0.2)

## 2019-11-26 NOTE — Progress Notes (Addendum)
Patient seen and examined, this is a 69 year old female with history of laryngeal cancer, chronic tracheostomy with multiple recent hospitalizations for respiratory distress and hypoxia. -Admitted to the ICU on 9/30, briefly intubated through her ostomy, and then transition to trach collar quickly, x-rays without overt pneumonia, no fever or leukocytosis, mucous plugging was  The etiology, now improved and stable -Anxious to go home, no acute medical needs at this time -Plan for discharge home once supplies have been set up today -Case management following, they have worked extensively on this patient to ensure home health and supplies -Discussed with PCCM who was following her till yesterday, plan for trach change followed by discharge  Domenic Polite, MD

## 2019-11-26 NOTE — Procedures (Signed)
Tracheostomy Change Note  Patient Details:   Name: Katelyn [name] DOB: [birth year] MRN: [id number]    Airway Documentation:     Evaluation  O2 sats: stable throughout Complications: No apparent complications Patient did tolerate procedure well. Bilateral Breath Sounds: Clear, Diminished     Pt trach changed by RT and Brandy CCM NP. Pts #6 Shiley cuffed trach removed and a new #6 cuffless Flexible Shiley trach was placed. Positive color change on the ETCO2 detector and bilateral breath sounds. Pt tolerated procedure well. New gauze and trach ties applied to pt. No breakdown noted to the neck and stoma site. Supplies given to pt for 5 days of trach care, spare trach and inner cannulas. RT will continue to monitor.   Sharla Kidney 11/26/2019, 5:33 PM

## 2019-11-26 NOTE — Progress Notes (Signed)
DISCHARGE NOTE HOME Cleva Camero to be discharged Home per MD order. Discussed prescriptions and follow up appointments with the patient. Prescriptions given to patient; medication list explained in detail. Patient verbalized understanding.  Skin clean, dry and intact without evidence of skin break down, no evidence of skin tears noted. IV catheter discontinued intact. Site without signs and symptoms of complications. Dressing and pressure applied. Pt denies pain at the site currently. No complaints noted.  Patient free of lines, drains, and wounds.   An After Visit Summary (AVS) was printed and given to the patient. Patient escorted via wheelchair, and discharged home via private auto.  Dolores Hoose, RN

## 2019-11-26 NOTE — Plan of Care (Signed)
  Problem: Clinical Measurements: Goal: Respiratory complications will improve Outcome: Progressing   Problem: Activity: Goal: Risk for activity intolerance will decrease Outcome: Progressing   Problem: Nutrition: Goal: Adequate nutrition will be maintained Outcome: Progressing   

## 2019-11-26 NOTE — TOC Progression Note (Signed)
Transition of Care [facility]) - Progression Note    Patient Details  Name: Katelyn [name] MRN: [id number] Date of Birth: May 15, 1950  Transition of Care [facility]) CM/SW [place], RN Phone Number: 11/26/2019, 3:01 PM  Clinical Narrative:     Spoke to daughter ernestine regarding supplies. She stated that [employer] came and took the two portable tanks when they delivered the bigger oxygen tanks, she is not sure why that happened. She is aware and her sister and aunt are also aware of how to suction tracheally. She is unsure about [name], the patients boyfriend. She spoke to her mother today regarding him and his actions when it came to [facility]. Ms Tally herself has refused [place] at times as well, which her daughter has told her that this cannot happen , as she needs to continue with the therapies She missed a MD appointment because she didn't feel like going so [name] did not take her. "Stuff like that makes me upset" She would like a call when Ms Lahey leaves so her sister can be at the home when the patient arrives. I told her I would call her when I see that Ms Tena is being discharged. She stated that Ms. Sanville  assured her that MR [name] has been talked to,   and that she would be compliant with care and HH.   Expected Discharge Plan: [place] Barriers to Discharge: No Barriers Identified  Expected Discharge Plan and Services Expected Discharge Plan: [place] In-house Referral: Clinical Social Work Discharge Planning Services: CM Consult   Living arrangements for the past 2 months: [place]: [place] Date [place]: 11/26/19 Time [place]: [id number] Representative spoke with at [place]: [place] ([place]) Interventions    Readmission Risk Interventions No flowsheet data found.

## 2019-11-26 NOTE — TOC Transition Note (Signed)
Transition of Care [facility]) - CM/SW Discharge Note   Patient Details  Name: Katelyn [name] MRN: [id number] Date of Birth: October 13, 1950  Transition of Care [facility]) CM/SW Contact:  Verdell Carmine, RN Phone Number: 11/26/2019, 10:57 AM   Clinical Narrative:     Patient set up with [place] OT RN and Social work through [facility] ([place]).    Final next level of care: [place] Barriers to Discharge: No Barriers Identified   Patient Goals and CMS Choice        Discharge Placement             Home with Home Health          Discharge Plan and Services In-house Referral: Clinical Social Work Discharge Planning Services: CM Consult                        [place] Agency: [place] Date [place]: 11/26/19 Time [place]: [id number] Representative spoke with at [place]: [place] ([place]) Interventions     Readmission Risk Interventions No flowsheet data found.

## 2019-11-26 NOTE — TOC Progression Note (Signed)
Transition of Care [facility]) - Progression Note    Patient Details  Name: Manahil Vanzile MRN: [id number] Date of Birth: [birth year]  Transition of Care [facility]) CM/SW [place], RN Phone Number: 11/26/2019, 9:39 AM  Clinical Narrative:    Gerome Apley, grandaughter regarding her grandmothers discharge needs, left message.    Expected Discharge Plan: [place] Barriers to Discharge: Continued Medical Work up  Expected Discharge Plan and Services Expected Discharge Plan: [place] In-house Referral: Clinical Social Work Discharge Planning Services: CM Consult   Living arrangements for the past 2 months: Single Family Home                                       Social Determinants of Health (SDOH) Interventions    Readmission Risk Interventions No flowsheet data found.

## 2019-11-26 NOTE — TOC Transition Note (Addendum)
Transition of Care [facility]) - CM/SW Discharge Note   Patient Details  Name: Rosia Syme MRN: [id number] Date of Birth: 1950/11/11  Transition of Care [facility]) CM/SW Contact:  Carles Collet, RN Phone Number: 11/26/2019, 12:15 PM   Clinical Narrative:   Damaris Schooner w patient and nurse at bedside, also used pen and paper.  Patient states that she plans on discharging to home. She has identified her boyfriend [name] and her daughter [name] [phone] as her support persons at home. She would like the [place] to contact [place] for set up of [place]. This has been relayed to Betances at [place]. She verified address on file as discharging address.  She verified that she has trach supplies at home.  She identified her boyfriend [name] as her ride home.  Notified [employer] to bring O2 tank for transport to room  14:30 Requested Andree Coss w [employer] to call Brandi NP to review with her the trach supplies brought to the patient's house by [employer] prior to this admission. Specifically size and style of shiley.    Final next level of care: [place] Barriers to Discharge: No Barriers Identified   Patient Goals and CMS Choice        Discharge Placement                       Discharge Plan and Services In-house Referral: Clinical Social Work Discharge Planning Services: CM Consult                        [place] Agency: [place] Date [place]: 11/26/19 Time [place]: 4034 Representative spoke with at [place]: [place] ([place]) Interventions     Readmission Risk Interventions No flowsheet data found.

## 2019-11-29 ENCOUNTER — Other Ambulatory Visit: Payer: Self-pay | Admitting: *Deleted

## 2019-11-29 DIAGNOSIS — Z9002 Acquired absence of larynx: Secondary | ICD-10-CM | POA: Diagnosis not present

## 2019-11-29 DIAGNOSIS — I10 Essential (primary) hypertension: Secondary | ICD-10-CM | POA: Diagnosis not present

## 2019-11-29 DIAGNOSIS — E7800 Pure hypercholesterolemia, unspecified: Secondary | ICD-10-CM | POA: Diagnosis not present

## 2019-11-29 DIAGNOSIS — Z930 Tracheostomy status: Secondary | ICD-10-CM | POA: Diagnosis not present

## 2019-11-29 DIAGNOSIS — R5381 Other malaise: Secondary | ICD-10-CM | POA: Diagnosis not present

## 2019-11-29 DIAGNOSIS — J9611 Chronic respiratory failure with hypoxia: Secondary | ICD-10-CM | POA: Diagnosis not present

## 2019-11-29 DIAGNOSIS — Z9981 Dependence on supplemental oxygen: Secondary | ICD-10-CM | POA: Diagnosis not present

## 2019-11-29 DIAGNOSIS — Z9119 Patient's noncompliance with other medical treatment and regimen: Secondary | ICD-10-CM | POA: Diagnosis not present

## 2019-11-29 NOTE — Patient Outreach (Signed)
[facility]) Care Management  11/29/2019  Katelyn [name] 1950-09-14 [id number]  Post acute care readmission follow up.  Katelyn [name] was readmitted from home  for hypoxia 3 days after discharge. She was sent home with home health services. Family members are primary caregivers.  There has been some miscommunication within the family with services in the past month which pertains to the pt's boyfriend interferring with needed services. He had refused Palliative Care consultation and also made it difficult for [employer] to provide their services. [employer] has declined to take on the pt for this reason on this new request and [facility] will be providing those needed services.  Today I spoke with [facility] regarding pt readmission and also regarding need for palliative care and or Hospice. She referred me to Katelyn [name]. I spoke to [employer] and introduced myself and [facility] services. She verifies that [facility] is providing skilled needs but that Katelyn [name] also needs a bath aid. She also agrees that a palliative care consult is warranted. She intends to discuss Katelyn mothers wishes with Katelyn tonight after work. We discussed that since Katelyn [name] was refusing some aspects of personal care and tx that these servicies and a change in [place] is warranted. She gave me permission to request this consult again.  Encouraged Katelyn to call me or MD for questions or problems. I will check in again next week to ensure services are forthcoming.  [name]. [name], MSN, [facility] Gerontological Nurse Practitioner [facility] Care Management [phone]

## 2019-11-30 ENCOUNTER — Ambulatory Visit: Payer: Self-pay | Admitting: *Deleted

## 2019-11-30 DIAGNOSIS — Z86711 Personal history of pulmonary embolism: Secondary | ICD-10-CM | POA: Diagnosis not present

## 2019-11-30 DIAGNOSIS — Z8521 Personal history of malignant neoplasm of larynx: Secondary | ICD-10-CM | POA: Diagnosis not present

## 2019-11-30 DIAGNOSIS — Z7901 Long term (current) use of anticoagulants: Secondary | ICD-10-CM | POA: Diagnosis not present

## 2019-11-30 DIAGNOSIS — J9601 Acute respiratory failure with hypoxia: Secondary | ICD-10-CM | POA: Diagnosis not present

## 2019-11-30 DIAGNOSIS — Z9002 Acquired absence of larynx: Secondary | ICD-10-CM | POA: Diagnosis not present

## 2019-11-30 DIAGNOSIS — I10 Essential (primary) hypertension: Secondary | ICD-10-CM | POA: Diagnosis not present

## 2019-11-30 DIAGNOSIS — I69351 Hemiplegia and hemiparesis following cerebral infarction affecting right dominant side: Secondary | ICD-10-CM | POA: Diagnosis not present

## 2019-11-30 DIAGNOSIS — Z8701 Personal history of pneumonia (recurrent): Secondary | ICD-10-CM | POA: Diagnosis not present

## 2019-11-30 DIAGNOSIS — E785 Hyperlipidemia, unspecified: Secondary | ICD-10-CM | POA: Diagnosis not present

## 2019-11-30 DIAGNOSIS — J9611 Chronic respiratory failure with hypoxia: Secondary | ICD-10-CM | POA: Diagnosis not present

## 2019-11-30 DIAGNOSIS — Z9981 Dependence on supplemental oxygen: Secondary | ICD-10-CM | POA: Diagnosis not present

## 2019-11-30 DIAGNOSIS — Z86718 Personal history of other venous thrombosis and embolism: Secondary | ICD-10-CM | POA: Diagnosis not present

## 2019-11-30 DIAGNOSIS — Z430 Encounter for attention to tracheostomy: Secondary | ICD-10-CM | POA: Diagnosis not present

## 2019-12-01 DIAGNOSIS — Z430 Encounter for attention to tracheostomy: Secondary | ICD-10-CM | POA: Diagnosis not present

## 2019-12-01 DIAGNOSIS — I69351 Hemiplegia and hemiparesis following cerebral infarction affecting right dominant side: Secondary | ICD-10-CM | POA: Diagnosis not present

## 2019-12-01 DIAGNOSIS — I10 Essential (primary) hypertension: Secondary | ICD-10-CM | POA: Diagnosis not present

## 2019-12-01 DIAGNOSIS — J9611 Chronic respiratory failure with hypoxia: Secondary | ICD-10-CM | POA: Diagnosis not present

## 2019-12-01 DIAGNOSIS — J9601 Acute respiratory failure with hypoxia: Secondary | ICD-10-CM | POA: Diagnosis not present

## 2019-12-01 DIAGNOSIS — E785 Hyperlipidemia, unspecified: Secondary | ICD-10-CM | POA: Diagnosis not present

## 2019-12-06 DIAGNOSIS — J441 Chronic obstructive pulmonary disease with (acute) exacerbation: Secondary | ICD-10-CM | POA: Diagnosis not present

## 2019-12-06 DIAGNOSIS — J15211 Pneumonia due to Methicillin susceptible Staphylococcus aureus: Secondary | ICD-10-CM | POA: Diagnosis not present

## 2019-12-06 DIAGNOSIS — J9611 Chronic respiratory failure with hypoxia: Secondary | ICD-10-CM | POA: Diagnosis not present

## 2019-12-06 DIAGNOSIS — Z430 Encounter for attention to tracheostomy: Secondary | ICD-10-CM | POA: Diagnosis not present

## 2019-12-06 DIAGNOSIS — E785 Hyperlipidemia, unspecified: Secondary | ICD-10-CM | POA: Diagnosis not present

## 2019-12-06 DIAGNOSIS — N1831 Chronic kidney disease, stage 3a: Secondary | ICD-10-CM | POA: Diagnosis not present

## 2019-12-06 DIAGNOSIS — I5030 Unspecified diastolic (congestive) heart failure: Secondary | ICD-10-CM | POA: Diagnosis not present

## 2019-12-06 DIAGNOSIS — J440 Chronic obstructive pulmonary disease with acute lower respiratory infection: Secondary | ICD-10-CM | POA: Diagnosis not present

## 2019-12-06 DIAGNOSIS — I130 Hypertensive heart and chronic kidney disease with heart failure and stage 1 through stage 4 chronic kidney disease, or unspecified chronic kidney disease: Secondary | ICD-10-CM | POA: Diagnosis not present

## 2019-12-06 DIAGNOSIS — J9622 Acute and chronic respiratory failure with hypercapnia: Secondary | ICD-10-CM | POA: Diagnosis not present

## 2019-12-06 DIAGNOSIS — I69351 Hemiplegia and hemiparesis following cerebral infarction affecting right dominant side: Secondary | ICD-10-CM | POA: Diagnosis not present

## 2019-12-06 DIAGNOSIS — D631 Anemia in chronic kidney disease: Secondary | ICD-10-CM | POA: Diagnosis not present

## 2019-12-06 DIAGNOSIS — J9621 Acute and chronic respiratory failure with hypoxia: Secondary | ICD-10-CM | POA: Diagnosis not present

## 2019-12-06 DIAGNOSIS — I10 Essential (primary) hypertension: Secondary | ICD-10-CM | POA: Diagnosis not present

## 2019-12-06 DIAGNOSIS — J9601 Acute respiratory failure with hypoxia: Secondary | ICD-10-CM | POA: Diagnosis not present

## 2019-12-07 ENCOUNTER — Other Ambulatory Visit: Payer: Self-pay | Admitting: *Deleted

## 2019-12-07 NOTE — Patient Outreach (Signed)
[facility]) Care Management  12/07/2019  Katelyn [name] Apr 16, 1950 [id number]   Telephone outreach week 2 f/u respiratory failure and palliative care status:  Left message for [name], pt's daughter, requested a return call.  [name]. [name], MSN, [facility] Gerontological Nurse Practitioner [facility] Care Management [phone]

## 2019-12-08 DIAGNOSIS — E785 Hyperlipidemia, unspecified: Secondary | ICD-10-CM | POA: Diagnosis not present

## 2019-12-08 DIAGNOSIS — I10 Essential (primary) hypertension: Secondary | ICD-10-CM | POA: Diagnosis not present

## 2019-12-08 DIAGNOSIS — Z430 Encounter for attention to tracheostomy: Secondary | ICD-10-CM | POA: Diagnosis not present

## 2019-12-08 DIAGNOSIS — J9601 Acute respiratory failure with hypoxia: Secondary | ICD-10-CM | POA: Diagnosis not present

## 2019-12-08 DIAGNOSIS — J9611 Chronic respiratory failure with hypoxia: Secondary | ICD-10-CM | POA: Diagnosis not present

## 2019-12-08 DIAGNOSIS — I69351 Hemiplegia and hemiparesis following cerebral infarction affecting right dominant side: Secondary | ICD-10-CM | POA: Diagnosis not present

## 2019-12-13 DIAGNOSIS — J9601 Acute respiratory failure with hypoxia: Secondary | ICD-10-CM | POA: Diagnosis not present

## 2019-12-13 DIAGNOSIS — I10 Essential (primary) hypertension: Secondary | ICD-10-CM | POA: Diagnosis not present

## 2019-12-13 DIAGNOSIS — E785 Hyperlipidemia, unspecified: Secondary | ICD-10-CM | POA: Diagnosis not present

## 2019-12-13 DIAGNOSIS — Z430 Encounter for attention to tracheostomy: Secondary | ICD-10-CM | POA: Diagnosis not present

## 2019-12-13 DIAGNOSIS — I69351 Hemiplegia and hemiparesis following cerebral infarction affecting right dominant side: Secondary | ICD-10-CM | POA: Diagnosis not present

## 2019-12-13 DIAGNOSIS — J9611 Chronic respiratory failure with hypoxia: Secondary | ICD-10-CM | POA: Diagnosis not present

## 2019-12-15 ENCOUNTER — Ambulatory Visit (INDEPENDENT_AMBULATORY_CARE_PROVIDER_SITE_OTHER): Payer: Medicare Other | Admitting: Pulmonary Disease

## 2019-12-15 ENCOUNTER — Encounter: Payer: Self-pay | Admitting: Pulmonary Disease

## 2019-12-15 ENCOUNTER — Other Ambulatory Visit: Payer: Self-pay

## 2019-12-15 ENCOUNTER — Ambulatory Visit: Payer: Self-pay | Admitting: *Deleted

## 2019-12-15 VITALS — BP 108/62 | HR 78 | Temp 97.0°F | Ht 63.0 in | Wt 139.4 lb

## 2019-12-15 DIAGNOSIS — J449 Chronic obstructive pulmonary disease, unspecified: Secondary | ICD-10-CM | POA: Diagnosis not present

## 2019-12-15 DIAGNOSIS — Z430 Encounter for attention to tracheostomy: Secondary | ICD-10-CM

## 2019-12-15 DIAGNOSIS — Z7901 Long term (current) use of anticoagulants: Secondary | ICD-10-CM | POA: Diagnosis not present

## 2019-12-15 DIAGNOSIS — R600 Localized edema: Secondary | ICD-10-CM | POA: Diagnosis not present

## 2019-12-15 NOTE — Patient Instructions (Signed)
Code Status Options: 1. Full Code = Chest compressions + Intubation (breathing tube with ventilator) 2. Intubation Only = Intubation (breathing tube with ventilator) and no chest compressions 3. DNR/DNI = do no resuscitate/do no intubate (no chest compressions and no breathing tube)  Use albuterol nebulizer treatments twice daily for mucous clearance  Wean oxygen via trach collar as able to maintain oxygen saturations >92%  Follow up in 1 month for trach evaluation

## 2019-12-15 NOTE — Progress Notes (Signed)
Synopsis: Hospital follow up  Subjective:   PATIENT ID: Katelyn [name] GENDER: female DOB: Sep 03, 1950, MRN: [id number]   HPI  Chief Complaint  Patient presents with  . Hospitalization Follow-up    Katelyn [name] is a 69 year old woman, former smoker with history of laryngeal cancer (diagnosed in 2015 but patient did not seek treatment til 2019) status post laryngectomy and tracheostomy, hypertension, hyperlipidemia, right leg DVT, and stroke who comes to clinic for hospital follow up.   She has admitted multiple times recently over the months of August and September, most recently being 9/30. She was found unresponsive the morning of 9/30 by her husband who reportedly tried to suction through her trach but was able to and her SpO2 was in the 30s when EMS arrive who then remove the trach and placed an ET tube through the stoma with resolution of her hypoxemia. Previously she came in with respiratory distress requiring endotracheal intubation through the stoma site. She was treated for MSSA pneumonia in September. Discharge planning was initially for an LTACH or SNF but patient and family agreed to be discharged home.   Since being home the patient has done well. She continues to have secretions requiring some suctioning. She is not using nebulizer treatments daily for pulmonary hygiene. She has been using 2-4 via trach collar at home.   She is taking eliquis therapy for the DVT and is asking if there is a way to get the medicine cheaper or change to a more affordable drug.   Past Medical History:  Diagnosis Date  . Acute gallstone pancreatitis 01/08/2014  . AKI (acute kidney injury) ([place]) 01/08/2014  . Anemia   . Blood transfusion    11'15 last admission  . Cancer ([place])   . Cholecystitis   . H/O tracheostomy   . Hip fracture ([place])   . Hypercholesteremia   . Hypertension   . Right leg DVT ([place]) 05/08/2014  . Septic shock - Kelbsiella 01/09/2014  . Shortness of breath   .  Stroke [facility])    '13- right side weakness, ambulates with cane     Family History  Problem Relation Age of Onset  . Breast cancer Maternal Aunt   . Diabetes Mellitus I Father   . Diabetes Mellitus I Sister   . Anesthesia problems Neg Hx   . Hypotension Neg Hx   . Malignant hyperthermia Neg Hx   . Pseudochol deficiency Neg Hx   . Colon cancer Neg Hx   . Esophageal cancer Neg Hx   . Pancreatic cancer Neg Hx   . Kidney disease Neg Hx   . Liver disease Neg Hx      Social History   Socioeconomic History  . Marital status: Widowed    Spouse name: Not on file  . Number of children: Not on file  . Years of education: Not on file  . Highest education level: Not on file  Occupational History  . Occupation: Retired  Tobacco Use  . Smoking status: Former Smoker    Packs/day: 3.00    Years: 48.00    Pack years: 144.00    Types: Cigarettes    Start date: 38    Quit date: 05/29/2017    Years since quitting: 2.5  . Smokeless tobacco: Never Used  . Tobacco comment: 1 carton lasted 3 days  Substance and Sexual Activity  . Alcohol use: No    Alcohol/week: 0.0 standard drinks  . Drug use: No  . Sexual activity: Not on  file  Other Topics Concern  . Not on file  Social History Narrative  . Not on file   Social Determinants of Health   Financial Resource Strain:   . Difficulty of Paying Living Expenses: Not on file  Food Insecurity:   . Worried About Charity fundraiser in the Last Year: Not on file  . Ran Out of Food in the Last Year: Not on file  Transportation Needs: No Transportation Needs  . Lack of Transportation (Medical): No  . Lack of Transportation (Non-Medical): No  Physical Activity:   . Days of Exercise per Week: Not on file  . Minutes of Exercise per Session: Not on file  Stress:   . Feeling of Stress : Not on file  Social Connections:   . Frequency of Communication with Friends and Family: Not on file  . Frequency of Social Gatherings with Friends and  Family: Not on file  . Attends Religious Services: Not on file  . Active Member of Clubs or Organizations: Not on file  . Attends Archivist Meetings: Not on file  . Marital Status: Not on file  Intimate Partner Violence:   . Fear of Current or Ex-Partner: Not on file  . Emotionally Abused: Not on file  . Physically Abused: Not on file  . Sexually Abused: Not on file     No Known Allergies   Outpatient Medications Prior to Visit  Medication Sig Dispense Refill  . albuterol (ACCUNEB) 1.25 MG/3ML nebulizer solution Take 1 ampule by nebulization every 6 (six) hours as needed for wheezing.    Marland Kitchen ALPRAZolam (XANAX) 0.5 MG tablet Take 1 tablet (0.5 mg total) by mouth 3 (three) times daily as needed for anxiety. 20 tablet 0  . apixaban (ELIQUIS) 5 MG TABS tablet Take 1 tablet (5 mg total) by mouth 2 (two) times daily. 60 tablet 0  . atorvastatin (LIPITOR) 80 MG tablet Take 1 tablet (80 mg total) by mouth daily. 30 tablet 0  . guaiFENesin-dextromethorphan (ROBITUSSIN DM) 100-10 MG/5ML syrup Take 10 mLs by mouth every 6 (six) hours. 118 mL 0  . melatonin 3 MG TABS tablet Take 1 tablet (3 mg total) by mouth at bedtime as needed (insomnia). 30 tablet 0  . metoprolol tartrate (LOPRESSOR) 25 MG tablet Take 1 tablet (25 mg total) by mouth 2 (two) times daily. 60 tablet 0  . Multiple Vitamins-Minerals (MULTIVITAMIN WITH MINERALS) tablet Take 1 tablet by mouth daily.    Marland Kitchen nystatin cream (MYCOSTATIN) Apply 1 application topically 2 (two) times daily. 30 g 0  . polycarbophil (FIBERCON) 625 MG tablet Take 1 tablet (625 mg total) by mouth daily. 30 tablet 0   No facility-administered medications prior to visit.    Review of Systems  Constitutional: Negative for chills, fever, malaise/fatigue and weight loss.  HENT: Negative for congestion, nosebleeds, sinus pain and sore throat.   Eyes: Negative for blurred vision.  Respiratory: Positive for cough and sputum production. Negative for  hemoptysis, shortness of breath and wheezing.   Cardiovascular: Positive for leg swelling. Negative for chest pain, palpitations and orthopnea.  Gastrointestinal: Negative for abdominal pain, blood in stool, diarrhea, heartburn, nausea and vomiting.  Genitourinary: Negative for dysuria and hematuria.  Musculoskeletal: Negative for joint pain and myalgias.  Skin: Negative for itching and rash.  Neurological: Negative for dizziness, weakness and headaches.  Endo/Heme/Allergies: Does not bruise/bleed easily.  Psychiatric/Behavioral: Negative.     Objective:   Vitals:   12/15/19 1046  BP: 108/62  Pulse: 78  Temp: (!) 97 F (36.1 C)  TempSrc: Temporal  SpO2: 100%  Weight: 139 lb 6.4 oz (63.2 kg)  Height: 5\' 3"  (1.6 m)     Physical Exam Constitutional:      Appearance: She is normal weight.  HENT:     Head: Normocephalic and atraumatic.     Nose: Nose normal.     Mouth/Throat:     Mouth: Mucous membranes are moist.     Pharynx: Oropharynx is clear.  Eyes:     General: No scleral icterus.    Extraocular Movements: Extraocular movements intact.     Conjunctiva/sclera: Conjunctivae normal.     Pupils: Pupils are equal, round, and reactive to light.  Neck:     Comments: Trach in place. Clean, dry, no bleeding or secretions Cardiovascular:     Rate and Rhythm: Normal rate and regular rhythm.     Pulses: Normal pulses.     Heart sounds: Normal heart sounds. No murmur heard.   Pulmonary:     Effort: Pulmonary effort is normal.     Breath sounds: Normal breath sounds. No wheezing, rhonchi or rales.  Abdominal:     General: Bowel sounds are normal.     Palpations: Abdomen is soft.  Musculoskeletal:     Right lower leg: Edema (1+) present.     Left lower leg: Edema (1+) present.  Skin:    Findings: No lesion or rash.  Neurological:     General: No focal deficit present.     Mental Status: She is alert and oriented to person, place, and time. Mental status is at baseline.   Psychiatric:        Mood and Affect: Mood normal.        Behavior: Behavior normal.        Thought Content: Thought content normal.        Judgment: Judgment normal.     CBC    Component Value Date/Time   WBC 7.6 11/26/2019 0503   RBC 3.07 (L) 11/26/2019 0503   HGB 9.0 (L) 11/26/2019 0503   HCT 30.2 (L) 11/26/2019 0503   PLT 214 11/26/2019 0503   MCV 98.4 11/26/2019 0503   MCH 29.3 11/26/2019 0503   MCHC 29.8 (L) 11/26/2019 0503   RDW 13.4 11/26/2019 0503   LYMPHSABS 0.9 11/24/2019 0527   MONOABS 0.7 11/24/2019 0527   EOSABS 0.1 11/24/2019 0527   BASOSABS 0.0 11/24/2019 0527   BMP Latest Ref Rng & Units 11/26/2019 11/25/2019 11/24/2019  Glucose 70 - 99 mg/dL 96 97 -  BUN 8 - 23 mg/dL 14 16 -  Creatinine 0.44 - 1.00 mg/dL 0.95 0.97 -  Sodium 135 - 145 mmol/L 143 141 142  Potassium 3.5 - 5.1 mmol/L 4.1 3.7 3.8  Chloride 98 - 111 mmol/L 105 106 -  CO2 22 - 32 mmol/L 29 29 -  Calcium 8.9 - 10.3 mg/dL 9.4 9.2 -    Chest imaging: 11/24/19 CXR Tracheostomy tube is in place. Heart size and pulmonary vascularity are normal. Prominence of the right inferior hilum likely represents mucous plugging as seen on prior CT of 04/12/2018. However this change could also represent mass or adenopathy. Slight fibrosis in the lung bases. No consolidation or airspace disease. No pleural effusions. No pneumothorax. Calcified and tortuous aorta.  PFT: None on file  Labs: Reviewed, as above  Echo: 11/11/19 1. Left ventricular ejection fraction, by estimation, is 65 to 70%. The  left ventricle has normal function. The  left ventricle has no regional  wall motion abnormalities. Left ventricular diastolic parameters are  consistent with Grade I diastolic  dysfunction (impaired relaxation).  2. Right ventricular systolic function is normal. The right ventricular  size is normal. There is normal pulmonary artery systolic pressure. The  estimated right ventricular systolic pressure is 89.3  mmHg.  3. The mitral valve is grossly normal. Trivial mitral valve  regurgitation. No evidence of mitral stenosis.  4. The aortic valve is tricuspid. There is moderate calcification of the  aortic valve. Aortic valve regurgitation is mild. Mild to moderate aortic  valve sclerosis/calcification is present, without any evidence of aortic  stenosis.  5. The inferior vena cava is normal in size with greater than 50%  respiratory variability, suggesting right atrial pressure of 3 mmHg. Heart Catheterization:     Assessment & Plan:   Chronic obstructive pulmonary disease, unspecified COPD type ([place])  Tracheostomy care [facility])  Current use of long term anticoagulation  Discussion: Katelyn [name] is a 69 year old woman, former smoker with history of laryngeal cancer (diagnosed in 2015 but patient did not seek treatment til 2019) status post laryngectomy and tracheostomy, hypertension, hyperlipidemia, right leg DVT, and stroke who comes to clinic for hospital follow up.   She has had frequent hospitalizations secondary to pneumonia and recurrent mucous plugging of her airways. We discussed the importance of pulmonary hygiene with twice daily albuterol nebulizer treatments and suctioning of secretions. She has #6 flexible, cuffless trach in place. We will not downsize at this time due to history of mucous plugging and continued reports of secretions. Trach collar oxygen can be weaned down to maintain SpO2 90-92%.   We discussed in detail the options in regards to code status given her recent need for hospitalizations. At this time, she wishes to remain full code. Patient's daughter and her will discuss further with the rest of their family and will update [place] if she decides to make changes to her decision.   She is to follow up in 1 month in the trach clinic  Freda Jackson, MD [place] Office: [phone]   See Amion for Pager Details      Current  Outpatient Medications:  .  albuterol (ACCUNEB) 1.25 MG/3ML nebulizer solution, Take 1 ampule by nebulization every 6 (six) hours as needed for wheezing., Disp: , Rfl:  .  ALPRAZolam (XANAX) 0.5 MG tablet, Take 1 tablet (0.5 mg total) by mouth 3 (three) times daily as needed for anxiety., Disp: 20 tablet, Rfl: 0 .  apixaban (ELIQUIS) 5 MG TABS tablet, Take 1 tablet (5 mg total) by mouth 2 (two) times daily., Disp: 60 tablet, Rfl: 0 .  atorvastatin (LIPITOR) 80 MG tablet, Take 1 tablet (80 mg total) by mouth daily., Disp: 30 tablet, Rfl: 0 .  guaiFENesin-dextromethorphan (ROBITUSSIN DM) 100-10 MG/5ML syrup, Take 10 mLs by mouth every 6 (six) hours., Disp: 118 mL, Rfl: 0 .  melatonin 3 MG TABS tablet, Take 1 tablet (3 mg total) by mouth at bedtime as needed (insomnia)., Disp: 30 tablet, Rfl: 0 .  metoprolol tartrate (LOPRESSOR) 25 MG tablet, Take 1 tablet (25 mg total) by mouth 2 (two) times daily., Disp: 60 tablet, Rfl: 0 .  Multiple Vitamins-Minerals (MULTIVITAMIN WITH MINERALS) tablet, Take 1 tablet by mouth daily., Disp: , Rfl:  .  nystatin cream (MYCOSTATIN), Apply 1 application topically 2 (two) times daily., Disp: 30 g, Rfl: 0 .  polycarbophil (FIBERCON) 625 MG tablet, Take 1 tablet (625 mg  total) by mouth daily., Disp: 30 tablet, Rfl: 0

## 2019-12-15 NOTE — Patient Outreach (Signed)
Referral from Deloria Lair, NP sent to [email or web address] for medication assistance.

## 2019-12-15 NOTE — Patient Outreach (Signed)
[facility]) Care Management  12/15/2019  Katelyn [name] [birth year] [id number]  Referral from MD requesting medication assistance: Eliquis.  Referral sent to pharmacy.  [name]. [name], MSN, [facility] Gerontological Nurse Practitioner [facility] Care Management [phone]

## 2019-12-20 ENCOUNTER — Other Ambulatory Visit: Payer: Self-pay | Admitting: *Deleted

## 2019-12-20 DIAGNOSIS — I10 Essential (primary) hypertension: Secondary | ICD-10-CM | POA: Diagnosis not present

## 2019-12-20 DIAGNOSIS — J9611 Chronic respiratory failure with hypoxia: Secondary | ICD-10-CM | POA: Diagnosis not present

## 2019-12-20 DIAGNOSIS — E785 Hyperlipidemia, unspecified: Secondary | ICD-10-CM | POA: Diagnosis not present

## 2019-12-20 DIAGNOSIS — I69351 Hemiplegia and hemiparesis following cerebral infarction affecting right dominant side: Secondary | ICD-10-CM | POA: Diagnosis not present

## 2019-12-20 DIAGNOSIS — Z430 Encounter for attention to tracheostomy: Secondary | ICD-10-CM | POA: Diagnosis not present

## 2019-12-20 DIAGNOSIS — J9601 Acute respiratory failure with hypoxia: Secondary | ICD-10-CM | POA: Diagnosis not present

## 2019-12-20 NOTE — Patient Outreach (Signed)
[facility] [facility]) Care Management  12/20/2019  Katelyn [name] 1951-02-22 [id number]  Telephone outreach.  Called pt's daughter, Katelyn [name], no answer. She had sent me several texts last week stating she intended to talk to me, and her mother's doctors about her health status.  Will try again another day this week.  Katelyn [name]. Katelyn Neither, MSN, [facility] Gerontological Nurse Practitioner [facility] Care Management [phone]

## 2019-12-22 ENCOUNTER — Other Ambulatory Visit: Payer: Self-pay | Admitting: *Deleted

## 2019-12-22 DIAGNOSIS — Z430 Encounter for attention to tracheostomy: Secondary | ICD-10-CM | POA: Diagnosis not present

## 2019-12-22 DIAGNOSIS — J9611 Chronic respiratory failure with hypoxia: Secondary | ICD-10-CM | POA: Diagnosis not present

## 2019-12-22 DIAGNOSIS — E785 Hyperlipidemia, unspecified: Secondary | ICD-10-CM | POA: Diagnosis not present

## 2019-12-22 DIAGNOSIS — J9601 Acute respiratory failure with hypoxia: Secondary | ICD-10-CM | POA: Diagnosis not present

## 2019-12-22 DIAGNOSIS — I10 Essential (primary) hypertension: Secondary | ICD-10-CM | POA: Diagnosis not present

## 2019-12-22 DIAGNOSIS — I69351 Hemiplegia and hemiparesis following cerebral infarction affecting right dominant side: Secondary | ICD-10-CM | POA: Diagnosis not present

## 2019-12-22 NOTE — Patient Outreach (Signed)
[place] [facility]) Care Management  12/22/2019  Shauntee Karp Nov 15, 1950 [id number]  Telephone outreach for follow up.  CHRONIC ILLNESS REVIEW: Spoke with [name], pt daughter. Ms. Rock Nephew recounted the progression of her mother's illness from dx of laryngeal ca, tx, apparent resolution but now has trach, underlying COPD, Respiratory Failure with increasing sxs, trips to ED and hospitalizations over the last 3 months.  Pt had Palliative care consult while hospitalized on 10/13/19 and an outpt palliative care consult was ordered. The consult outreach was done on 11/07/19 and was refused by pt's significant other, Artelia Laroche who IS NOT a Media planner for Mrs. Pruden. Since that time the pt has had 2 more admisssions for Hypoxia and Respiratory Failure.  Pt has been seen by Dr. Erin Fulling on 12/15/19, pulmonology.  Today 12/23/19, NP discussed COPD disease trajectory with her daughter and value of palliative care for sx management. Advised NP would initiate a new request for an in home palliative consult with [facility]. Advised grand daughter, [name] who is the [employer], no document in Epic, and both daughters participate in this consult with the pt so that everyone receives the same information and is in agreement with the plan of care.  Called [facility] and spoke with Wendie Simmer who took information and hx. She will call Dr. Erin Fulling to get a new order.  Sent [name] a text as agreed upon yesterday that the palliative care request has been made and to expect a call. Advised Eddie Candle also.  NP to call next Wednesday to follow up on progress of referral and verify pt health status.  [name]. [name], MSN, [facility] Gerontological Nurse Practitioner [facility] Care Management [phone]

## 2019-12-23 ENCOUNTER — Telehealth: Payer: Self-pay | Admitting: Pulmonary Disease

## 2019-12-23 NOTE — Telephone Encounter (Signed)
Called [facility] and spoke with Langley Gauss and notified of response per Dr Erin Fulling. Pt and family have agreed to services. Nothing further needed.

## 2019-12-23 NOTE — Telephone Encounter (Signed)
Called and spoke with Marzetta Board at [facility]  She states pt's Katelyn [name] sent request for the pt to receive palliative care services  Dr. Erin Fulling- can we give verbal okay for this?

## 2019-12-23 NOTE — Telephone Encounter (Signed)
Yes, this is ok from my standpoint as long as the patient and her family have agreed to these services.  Thanks, Wille Glaser

## 2019-12-26 ENCOUNTER — Telehealth: Payer: Self-pay

## 2019-12-26 NOTE — Telephone Encounter (Signed)
LATE ENTRY Telephone call to patient to schedule palliative care visit with patient. Patient/family in agreement with home visit on 12/29/19 @ 1 pm.

## 2019-12-27 DIAGNOSIS — J9601 Acute respiratory failure with hypoxia: Secondary | ICD-10-CM | POA: Diagnosis not present

## 2019-12-27 DIAGNOSIS — E785 Hyperlipidemia, unspecified: Secondary | ICD-10-CM | POA: Diagnosis not present

## 2019-12-27 DIAGNOSIS — I10 Essential (primary) hypertension: Secondary | ICD-10-CM | POA: Diagnosis not present

## 2019-12-27 DIAGNOSIS — E7800 Pure hypercholesterolemia, unspecified: Secondary | ICD-10-CM | POA: Diagnosis not present

## 2019-12-27 DIAGNOSIS — I69351 Hemiplegia and hemiparesis following cerebral infarction affecting right dominant side: Secondary | ICD-10-CM | POA: Diagnosis not present

## 2019-12-27 DIAGNOSIS — R600 Localized edema: Secondary | ICD-10-CM | POA: Diagnosis not present

## 2019-12-27 DIAGNOSIS — Z430 Encounter for attention to tracheostomy: Secondary | ICD-10-CM | POA: Diagnosis not present

## 2019-12-27 DIAGNOSIS — J9611 Chronic respiratory failure with hypoxia: Secondary | ICD-10-CM | POA: Diagnosis not present

## 2019-12-27 DIAGNOSIS — F418 Other specified anxiety disorders: Secondary | ICD-10-CM | POA: Diagnosis not present

## 2019-12-28 ENCOUNTER — Other Ambulatory Visit: Payer: Self-pay

## 2019-12-28 ENCOUNTER — Other Ambulatory Visit: Payer: Self-pay | Admitting: *Deleted

## 2019-12-28 DIAGNOSIS — Z430 Encounter for attention to tracheostomy: Secondary | ICD-10-CM | POA: Diagnosis not present

## 2019-12-28 DIAGNOSIS — I69351 Hemiplegia and hemiparesis following cerebral infarction affecting right dominant side: Secondary | ICD-10-CM | POA: Diagnosis not present

## 2019-12-28 DIAGNOSIS — J9611 Chronic respiratory failure with hypoxia: Secondary | ICD-10-CM | POA: Diagnosis not present

## 2019-12-28 DIAGNOSIS — J9601 Acute respiratory failure with hypoxia: Secondary | ICD-10-CM | POA: Diagnosis not present

## 2019-12-28 DIAGNOSIS — I10 Essential (primary) hypertension: Secondary | ICD-10-CM | POA: Diagnosis not present

## 2019-12-28 DIAGNOSIS — E785 Hyperlipidemia, unspecified: Secondary | ICD-10-CM | POA: Diagnosis not present

## 2019-12-28 NOTE — Patient Outreach (Signed)
[place] [facility]) Care Management  12/28/2019  [name] [birth year] [id number]  Telephone outreach.  Unsuccessful, left message and requested a return call.  Note pt is scheduled for a palliative care consult at her home tomorrow.  [name]. [name], MSN, [facility] Gerontological Nurse Practitioner [facility] Care Management [phone]

## 2019-12-29 ENCOUNTER — Other Ambulatory Visit: Payer: Medicare Other

## 2019-12-29 ENCOUNTER — Encounter: Payer: Self-pay | Admitting: *Deleted

## 2019-12-29 ENCOUNTER — Other Ambulatory Visit: Payer: Medicare Other | Admitting: *Deleted

## 2019-12-29 VITALS — BP 165/96 | HR 76 | Temp 97.6°F | Resp 20

## 2019-12-29 DIAGNOSIS — Z515 Encounter for palliative care: Secondary | ICD-10-CM

## 2019-12-30 DIAGNOSIS — I69351 Hemiplegia and hemiparesis following cerebral infarction affecting right dominant side: Secondary | ICD-10-CM | POA: Diagnosis not present

## 2019-12-30 DIAGNOSIS — Z430 Encounter for attention to tracheostomy: Secondary | ICD-10-CM | POA: Diagnosis not present

## 2019-12-30 DIAGNOSIS — J9611 Chronic respiratory failure with hypoxia: Secondary | ICD-10-CM | POA: Diagnosis not present

## 2019-12-30 DIAGNOSIS — Z86711 Personal history of pulmonary embolism: Secondary | ICD-10-CM | POA: Diagnosis not present

## 2019-12-30 DIAGNOSIS — Z9002 Acquired absence of larynx: Secondary | ICD-10-CM | POA: Diagnosis not present

## 2019-12-30 DIAGNOSIS — Z9981 Dependence on supplemental oxygen: Secondary | ICD-10-CM | POA: Diagnosis not present

## 2019-12-30 DIAGNOSIS — J9601 Acute respiratory failure with hypoxia: Secondary | ICD-10-CM | POA: Diagnosis not present

## 2019-12-30 DIAGNOSIS — Z8701 Personal history of pneumonia (recurrent): Secondary | ICD-10-CM | POA: Diagnosis not present

## 2019-12-30 DIAGNOSIS — E785 Hyperlipidemia, unspecified: Secondary | ICD-10-CM | POA: Diagnosis not present

## 2019-12-30 DIAGNOSIS — I10 Essential (primary) hypertension: Secondary | ICD-10-CM | POA: Diagnosis not present

## 2019-12-30 DIAGNOSIS — Z86718 Personal history of other venous thrombosis and embolism: Secondary | ICD-10-CM | POA: Diagnosis not present

## 2019-12-30 DIAGNOSIS — Z7901 Long term (current) use of anticoagulants: Secondary | ICD-10-CM | POA: Diagnosis not present

## 2019-12-30 DIAGNOSIS — Z8521 Personal history of malignant neoplasm of larynx: Secondary | ICD-10-CM | POA: Diagnosis not present

## 2019-12-30 NOTE — Progress Notes (Signed)
COMMUNITY PALLIATIVE CARE SW NOTE  PATIENT NAME: Katelyn [name] DOB: 10/30/1950 MRN: [id number]  PRIMARY CARE PROVIDER: Aretta Nip, MD  RESPONSIBLE PARTY:  Acct ID - Guarantor Home Phone Work Phone Relationship Acct Type  [id number] LIZBET, [phone]  Self P/F     [address], [place], [place] [address]     PLAN OF CARE and INTERVENTIONS:             1. GOALS OF CARE/ ADVANCE CARE PLANNING: Goal is for patient to remain in his home. Patient is a FULL CODE. 2. SOCIAL/EMOTIONAL/SPIRITUAL ASSESSMENT/ INTERVENTIONS: SW and RN-Monishia Howard visited with patient at her home. She was present with  Both her daughters and several extended family members. Introduction to the palliative care team and education to the palliative care program. Patient provided a verbal consent to palliative care services. Patient had several ED and hospital admissions in Aug./Sept. due to episodes of unresponsiveness and pneumonia. Patient has a trachea. She denied. Patient is on 3L of o2 at night and PRN during the day and it does help her breathing. She utilizes a walker to ambulate. She is independent for personal care needs. Her boyfriend and family members assist with cooking an household chores. Her appetite is good and eating 3 to 4 meals a day and she has not experienced any weight loss. She takes her medications crushed with applesauce. Her daughter manages her medications. Patient's COPD is progressing, but she reports her symptoms are stable.  SOCIAL HISTORY: Patient was born in [place], [place]. Patient obtained her GED. Patient worked for [employer]. Patient is widowed. Patient has three children. She is of Holiness faith. Patient has a POA/HCPOA (granddaughter). He is a FULL CODE.  3. PATIENT/CAREGIVER EDUCATION/ COPING:  Patient is alert and oriented x 4. She was receptive to the palliative care team and support. She has a very supportive family and they have sought resources to  ensure patient's needs are being met and have the best quality of life. 4. PERSONAL EMERGENCY PLAN: 911 can be activated for emergencies. 5. COMMUNITY RESOURCES COORDINATION/ HEALTH CARE NAVIGATION: Patient is receiving physical therapy 2x week through [facility]. Speech Therapy and occupation therapy was also ordered. Patient has applied for medicaid. Patient/family is also seeking assistance with paying or her medication Eliquis..  6. FINANCIAL/LEGAL CONCERNS/INTERVENTIONS:  None.     SOCIAL HX:  Social History   Tobacco Use  . Smoking status: Former Smoker    Packs/day: 3.00    Years: 48.00    Pack years: 144.00    Types: Cigarettes    Start date: 84    Quit date: 05/29/2017    Years since quitting: 2.5  . Smokeless tobacco: Never Used  . Tobacco comment: 1 carton lasted 3 days  Substance Use Topics  . Alcohol use: No    Alcohol/week: 0.0 standard drinks    CODE STATUS: Patient is a FULL CODE  ADVANCED DIRECTIVES: No MOST FORM COMPLETE: No HOSPICE EDUCATION PROVIDED: No  PPS: Patient is alert and oriented x 4. She ambulates with a walker. Patient is independent for personal care needs.  Duration of visit and documentation: 60 minutes.      [address] [place], [place]

## 2020-01-02 ENCOUNTER — Other Ambulatory Visit: Payer: Self-pay | Admitting: *Deleted

## 2020-01-02 NOTE — Patient Outreach (Addendum)
[facility]) Care Management  01/02/2020  Katelyn [name] Jan 19, 1951 [id number]   Telephone outreach to follow up on [place].  Talked with [name], pt sister today. She reports the family had a very good and informative meeting with Bo Mcclintock, LCSW with [facility]. The pt agreed to the services.  [name] asks me about the stage of COPD her mother has. NP gave explanation of disease trajectory considering hospitalization frequency and degree of severity when hospitalized indicates end-stage disease.  We agreed to talk again in one month. Encouraged her to call with any questions or problems.  Goals    . Matintain My Quality of Life     Follow Up Date 02/24/20  - check out options for in-home help, long-term care or hospice - do one enjoyable thing every day - make shared treatment decisions with doctor - spend time outdoors at least 3 times a week - strengthen or fix relationships with loved ones    Why is this important?   Having a long-term illness can be scary.  It can also be stressful for you and your caregiver.  These steps may help.    Notes: Palliative care consult requested.    . Track and Manage My Symptoms     Follow Up Date 01/24/20  - arrange respite care for caregiver - follow rescue plan if symptoms flare-up - keep follow-up appointments    Why is this important?   Tracking your symptoms and other information about your health helps your doctor plan your care.  Write down the symptoms, the time of day, what you were doing and what medicine you are taking.  You will soon learn how to manage your symptoms.     Notes: Daughter, [name], agreed to Palliative Care consult.       [name]. [name], MSN, [facility] Gerontological Nurse Practitioner [facility] Care Management [phone]

## 2020-01-02 NOTE — Progress Notes (Signed)
COMMUNITY PALLIATIVE CARE RN NOTE  PATIENT NAME: Katelyn [name] DOB: 09/25/1950 MRN: [id number]  PRIMARY CARE PROVIDER: Aretta Nip, MD  RESPONSIBLE PARTY:  Acct ID - Guarantor Home Phone Work Phone Relationship Acct Type  [id number] ABERDEEN, [phone]  Self P/F     [place], [place] [address]   Covid-19 Pre-screening Negative  PLAN OF CARE and INTERVENTION:  1. ADVANCE CARE PLANNING/GOALS OF CARE: Goal is for patient to remain in her home. She is a Full code. 2. PATIENT/CAREGIVER EDUCATION: Explained palliative care services, symptom management, dyspnea management, safe mobility, s/s of infection 3. DISEASE STATUS: Joint visit made with Palliative care SW, Monica Lonon. Met with patient, daughter's and several other family members in her home. Upon arrival, patient is sitting up in her chair at the dining table awake and alert. She does have a trachea collar so understanding what she is saying can be difficult at times. I mainly read her lips. She denies pain. She does become short of breath with exertion. She ambulates using a rollator walker. She had to use the bathroom during visit. Observed patient and she had to take several rest periods before making it back to the dining table. She wears 3L of oxygen during the night and as needed during the day. She has a productive cough and has Guaifenesin to help. She has had 5 hospitalizations over the past 2 months for respiratory issues and pneumonia. Her COPD is progressing. She is currently receiving PT 2x/week through [facility] home health. OT has discharged patient and the RN has one more visit left. She is able to perform ADLs independently, but she does tire easily. She has a good appetite. She eats 3-4 meals/day. She eats soft foods. She crushes her medications and takes in applesauce. Her daughter manages patient's pill box. Meals on wheels are set to restart their services to bring meals to patient. She is  continent of both bowel and bladder and denies any issues. She is agreeable to future visits from palliative care. Will continue to monitor.   HISTORY OF PRESENT ILLNESS: This is a 69 yo female with a history of essential hypertension, cancer of hypopharynx, COPD, dysphagia, CKD Stage 3 and difficult airway. Palliative care team has been asked to follow patient for additional support. Will visit patient monthly and PRN.  CODE STATUS: Full code ADVANCED DIRECTIVES: Y MOST FORM: no PPS: 50%   PHYSICAL EXAM:   VITALS: Today's Vitals   12/29/19 1124  BP: (!) 165/96  Pulse: 76  Resp: 20  Temp: 97.6 F (36.4 C)  TempSrc: Temporal  SpO2: 100%  PainSc: 0-No pain    LUNGS: decreased breath sounds CARDIAC: Cor RRR EXTREMITIES: Trace edema bilateral lower extremities SKIN: Skin color, texture, turgor normal. No rashes or lesions  NEURO: Alert and oriented x 4, generalized weakness, ambulatory w/rollator walker   (Duration of visit and documentation 75 minutes)   Daryl Eastern, RN BSN

## 2020-01-03 DIAGNOSIS — J9601 Acute respiratory failure with hypoxia: Secondary | ICD-10-CM | POA: Diagnosis not present

## 2020-01-03 DIAGNOSIS — E785 Hyperlipidemia, unspecified: Secondary | ICD-10-CM | POA: Diagnosis not present

## 2020-01-03 DIAGNOSIS — J9611 Chronic respiratory failure with hypoxia: Secondary | ICD-10-CM | POA: Diagnosis not present

## 2020-01-03 DIAGNOSIS — I69351 Hemiplegia and hemiparesis following cerebral infarction affecting right dominant side: Secondary | ICD-10-CM | POA: Diagnosis not present

## 2020-01-03 DIAGNOSIS — I10 Essential (primary) hypertension: Secondary | ICD-10-CM | POA: Diagnosis not present

## 2020-01-03 DIAGNOSIS — Z430 Encounter for attention to tracheostomy: Secondary | ICD-10-CM | POA: Diagnosis not present

## 2020-01-10 ENCOUNTER — Other Ambulatory Visit: Payer: Self-pay | Admitting: *Deleted

## 2020-01-10 DIAGNOSIS — J9601 Acute respiratory failure with hypoxia: Secondary | ICD-10-CM | POA: Diagnosis not present

## 2020-01-10 DIAGNOSIS — J9611 Chronic respiratory failure with hypoxia: Secondary | ICD-10-CM | POA: Diagnosis not present

## 2020-01-10 DIAGNOSIS — E785 Hyperlipidemia, unspecified: Secondary | ICD-10-CM | POA: Diagnosis not present

## 2020-01-10 DIAGNOSIS — Z430 Encounter for attention to tracheostomy: Secondary | ICD-10-CM | POA: Diagnosis not present

## 2020-01-10 DIAGNOSIS — I69351 Hemiplegia and hemiparesis following cerebral infarction affecting right dominant side: Secondary | ICD-10-CM | POA: Diagnosis not present

## 2020-01-10 DIAGNOSIS — I10 Essential (primary) hypertension: Secondary | ICD-10-CM | POA: Diagnosis not present

## 2020-01-10 NOTE — Patient Outreach (Signed)
[facility] [facility]) Care Management  01/10/2020  Katelyn [name] 1950-11-28 [id number]  Incoming call from pt's daughter, Katelyn [name]. She says she has me on speaker [name] her mother can hear me. She says her mother said the doctor said she doesn't have to come back (pulmonology).  Explained that her hospital discharge orders say that she is to schedule an appt for 8 weeks after discharge which would be in the first week of December. Explained this is important to visit to ensure your airway is in the best possible condition for adequate respiratory function and a good quality of life. Ongoing appts will also be necessary. Encouraged home nebs and tracheostomy site hygiene.  NP to call back again in December for follow up. Encouraged them to call anytime with problems or questions and praised them for calling me today!  Katelyn [name]. Katelyn Neither, MSN, [facility] Gerontological Nurse Practitioner [facility] Care Management [phone]

## 2020-01-12 DIAGNOSIS — J9601 Acute respiratory failure with hypoxia: Secondary | ICD-10-CM | POA: Diagnosis not present

## 2020-01-12 DIAGNOSIS — I10 Essential (primary) hypertension: Secondary | ICD-10-CM | POA: Diagnosis not present

## 2020-01-12 DIAGNOSIS — I69351 Hemiplegia and hemiparesis following cerebral infarction affecting right dominant side: Secondary | ICD-10-CM | POA: Diagnosis not present

## 2020-01-12 DIAGNOSIS — E785 Hyperlipidemia, unspecified: Secondary | ICD-10-CM | POA: Diagnosis not present

## 2020-01-12 DIAGNOSIS — Z430 Encounter for attention to tracheostomy: Secondary | ICD-10-CM | POA: Diagnosis not present

## 2020-01-12 DIAGNOSIS — J9611 Chronic respiratory failure with hypoxia: Secondary | ICD-10-CM | POA: Diagnosis not present

## 2020-01-17 DIAGNOSIS — Z930 Tracheostomy status: Secondary | ICD-10-CM | POA: Diagnosis not present

## 2020-01-17 DIAGNOSIS — I10 Essential (primary) hypertension: Secondary | ICD-10-CM | POA: Diagnosis not present

## 2020-01-17 DIAGNOSIS — I69351 Hemiplegia and hemiparesis following cerebral infarction affecting right dominant side: Secondary | ICD-10-CM | POA: Diagnosis not present

## 2020-01-17 DIAGNOSIS — Z9002 Acquired absence of larynx: Secondary | ICD-10-CM | POA: Diagnosis not present

## 2020-01-17 DIAGNOSIS — Z85819 Personal history of malignant neoplasm of unspecified site of lip, oral cavity, and pharynx: Secondary | ICD-10-CM | POA: Diagnosis not present

## 2020-01-17 DIAGNOSIS — E785 Hyperlipidemia, unspecified: Secondary | ICD-10-CM | POA: Diagnosis not present

## 2020-01-17 DIAGNOSIS — Z430 Encounter for attention to tracheostomy: Secondary | ICD-10-CM | POA: Diagnosis not present

## 2020-01-17 DIAGNOSIS — Z77090 Contact with and (suspected) exposure to asbestos: Secondary | ICD-10-CM | POA: Diagnosis not present

## 2020-01-17 DIAGNOSIS — J9601 Acute respiratory failure with hypoxia: Secondary | ICD-10-CM | POA: Diagnosis not present

## 2020-01-17 DIAGNOSIS — J9611 Chronic respiratory failure with hypoxia: Secondary | ICD-10-CM | POA: Diagnosis not present

## 2020-01-30 ENCOUNTER — Other Ambulatory Visit: Payer: Self-pay | Admitting: *Deleted

## 2020-01-30 NOTE — Patient Outreach (Signed)
[facility]) Care Management  01/30/2020  Katelyn [name] [birth year] [id number]  Telephone outreach, follow up Respiratory Failure  Unsuccessful, left message and requested a return call.  Will call again later this week.  [name]. [name], MSN, [facility] Gerontological Nurse Practitioner [facility] Care Management [phone]

## 2020-02-01 ENCOUNTER — Other Ambulatory Visit: Payer: Self-pay | Admitting: *Deleted

## 2020-02-01 NOTE — Patient Outreach (Signed)
[facility]) Care Management  02/01/2020  Katelyn [name] 03/26/1950 [id number]  Second outreach this week. Talked briefly with pts significant other who does not have authorized consent, nor did he appreciate the call or NP asking for the authorized caregivers.   Will try to message Katelyn [name] and Katelyn [name] for update.  Talked with Katelyn [name]. She reports she has been sick. Katelyn [name] is in [place] and she has not heard from [name] in several weeks. These are the 3 primary care family members who advocate for Katelyn [name].  Katelyn [name] reports that her sister does not have her nebulizer medication because it was coded wrong. She evidently has not had it since she was hospitalized but NP was told previously by Ms. Katelyn [name] that she did have her nebulizer meds.  Advised this is something she does need to keep her airway open and moisturized. NP to send request for reorder to pulmonology.  Goals Addressed            This Visit's Progress   . Matintain My Quality of Life   On track    Follow Up Date 03/10/20  - check out options for in-home help, long-term care or hospice - do one enjoyable thing every day - make shared treatment decisions with doctor - spend time outdoors at least 3 times a week - strengthen or fix relationships with loved ones    Why is this important?   Having a long-term illness can be scary.  It can also be stressful for you and your caregiver.  These steps may help.    Notes: Palliative care consult requested. 02/02/20 Palliative care consult was done and family/pt agreed to services. Advised Katelyn [name] that Mr. [name] is a barrier to her sister getting necessary care and that she needs to be present when the palliative care nurse visits to avoid disruption of appropriate care. Advising [facility] also to ensure they contact Katelyn [name] when scheduling a home visit or talk with her when they need an update.    . Track and Manage My Symptoms   Not on  track    Follow Up Date 03/10/20  - arrange respite care for caregiver - follow rescue plan if symptoms flare-up - keep follow-up appointments    Why is this important?   Tracking your symptoms and other information about your health helps your doctor plan your care.  Write down the symptoms, the time of day, what you were doing and what medicine you are taking.  You will soon learn how to manage your symptoms.     Notes: Daughter, [name], agreed to Palliative Care consult.02/02/20 No new problems, however, pt does not have her necessary nebs med. Difficult to assess really how pt is getting what she needs as her sisters are not in the home on a daily basis. Pt's significant other is not cooperative with talking to any care team members. Will communicate with palliative care team.        We agreed to talk again in January.  [name]. [name], MSN, [facility] Gerontological Nurse Practitioner [facility] Care Management [phone]

## 2020-02-03 ENCOUNTER — Telehealth: Payer: Self-pay | Admitting: Pulmonary Disease

## 2020-02-03 MED ORDER — ALBUTEROL SULFATE (2.5 MG/3ML) 0.083% IN NEBU: 2.5000 mg | INHALATION_SOLUTION | Freq: Four times a day (QID) | RESPIRATORY_TRACT | 12 refills | Status: DC | PRN

## 2020-02-03 NOTE — Telephone Encounter (Signed)
Staff message received by Dr. Erin Fulling: Erin Fulling, Cheryle Horsfall, MD  P Lb[facility] Triage Pool Can we get her nebulizer medicine ordered correctly please.   Thanks,  Jon        Previous Messages   ----- Message -----  From: Deloria Lair, NP  Sent: 02/02/2020  5:58 PM EST  To: Freddi Starr, MD  Subject: Pt needs new Rx for nebs             Hello, Dr. Erin Fulling,   I spoke to pt's sister today who admitted to me today that they did not get her nebulizer med after her last hospitalization because the order was coded wrong or billed on the wrong Medicare part. Would you please kindly reorder her Accuneb.   Thank you!   [name]. [name], MSN, GNP-BC  Gerontological Nurse Practitioner  [facility] Management  [phone]     Dr. Erin Fulling,  Do you want [place] to use the same Rx of the albuterol 1.25mg /22ml neb sol or do you want [place] to send in what we usually prescribe of albuterol 2.5/40ml (0.083%)?

## 2020-02-03 NOTE — Telephone Encounter (Signed)
Pt daughter is returning call from office . Please advise

## 2020-02-03 NOTE — Telephone Encounter (Signed)
I have sent Rx for albuterol neb sol to pt's pharmacy listed on file. Attempted to call pt but unable to reach. Left message for her to return call.

## 2020-02-03 NOTE — Telephone Encounter (Signed)
Called and spoke with pt's daughter letting her know that the Rx for pt's albuterol sol has been sent to pharmacy for pt and she verbalized understanding. Nothing further needed.

## 2020-02-03 NOTE — Telephone Encounter (Signed)
Send what we normally prescribe.   Thank you, Wille Glaser

## 2020-02-08 ENCOUNTER — Ambulatory Visit (INDEPENDENT_AMBULATORY_CARE_PROVIDER_SITE_OTHER): Payer: Medicare Other | Admitting: Pulmonary Disease

## 2020-02-08 ENCOUNTER — Other Ambulatory Visit: Payer: Self-pay

## 2020-02-08 ENCOUNTER — Encounter: Payer: Self-pay | Admitting: Pulmonary Disease

## 2020-02-08 VITALS — BP 124/78 | HR 69 | Ht 63.0 in | Wt 139.2 lb

## 2020-02-08 DIAGNOSIS — Z430 Encounter for attention to tracheostomy: Secondary | ICD-10-CM

## 2020-02-08 DIAGNOSIS — J449 Chronic obstructive pulmonary disease, unspecified: Secondary | ICD-10-CM

## 2020-02-08 NOTE — Patient Instructions (Signed)
Continue robitussin DM for cough and secretions. Can try guaifenesin (mucinex) alone if more tolerable.   Use albuterol nebulizer twice daily for bronchial hygiene and improving secretion clearance.

## 2020-02-08 NOTE — Progress Notes (Signed)
Synopsis: Hospital follow up  Subjective:   PATIENT ID: Katelyn [name] GENDER: female DOB: 08-26-1950, MRN: [id number]   HPI  Chief Complaint  Patient presents with  . Follow-up    Pt's daughter states pt has been doing fine since last visit. Pt needs to know if the diagnosis of COPD was due to exposure of asbestos.   Katelyn [name] is a 69 year old woman, former smoker with history of laryngeal cancer (diagnosed in 2015 but patient did not seek treatment til 2019) status post laryngectomy and tracheostomy, hypertension, hyperlipidemia, right leg DVT, and stroke who returns to pulmonary clinic for follow up.   She has been doing well since last visit 12/15/19. She has not had any hospital admissions since she was discharged on 11/26/19. She denies issues with expectorating mucous or secretions via her trach. She is taking robitussin DM on a daily basis. She has not picked up her albuterol nebulizer treatments yet for bronchial hygiene. She has weaned off trach collar supplemental oxygen.  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 Informed her that this doesn't seem likely as there are no signs of pleural plaques on her chest imaging or signs of insterstitial lung disease. Informed her that she has emphysematous changes on her CT scan which are related to her history of smoking.   Past Medical History:  Diagnosis Date  . Acute gallstone pancreatitis 01/08/2014  . AKI (acute kidney injury) ([place]) 01/08/2014  . Anemia   . Blood transfusion    11'15 last admission  . Cancer ([place])   . Cholecystitis   . H/O tracheostomy   . Hip fracture ([place])   . Hypercholesteremia   . Hypertension   . Right leg DVT ([place]) 05/08/2014  . Septic shock - Kelbsiella 01/09/2014  . Shortness of breath   . Stroke [facility])    '13- right side weakness, ambulates with cane     Family History  Problem Relation Age of Onset  .  Breast cancer Maternal Aunt   . Diabetes Mellitus I Father   . Diabetes Mellitus I Sister   . Anesthesia problems Neg Hx   . Hypotension Neg Hx   . Malignant hyperthermia Neg Hx   . Pseudochol deficiency Neg Hx   . Colon cancer Neg Hx   . Esophageal cancer Neg Hx   . Pancreatic cancer Neg Hx   . Kidney disease Neg Hx   . Liver disease Neg Hx      Social History   Socioeconomic History  . Marital status: Widowed    Spouse name: Not on file  . Number of children: Not on file  . Years of education: Not on file  . Highest education level: Not on file  Occupational History  . Occupation: Retired  Tobacco Use  . Smoking status: Former Smoker    Packs/day: 3.00    Years: 48.00    Pack years: 144.00    Types: Cigarettes    Start date: 21    Quit date: 05/29/2017    Years since quitting: 2.6  . Smokeless tobacco: Never Used  . Tobacco comment: 1 carton lasted 3 days  Substance and Sexual Activity  . Alcohol use: No    Alcohol/week: 0.0 standard drinks  . Drug use: No  . Sexual activity: Not on file  Other Topics Concern  . Not on file  Social History Narrative  . Not on file  Social Determinants of Health   Financial Resource Strain: Not on file  Food Insecurity: Not on file  Transportation Needs: No Transportation Needs  . Lack of Transportation (Medical): No  . Lack of Transportation (Non-Medical): No  Physical Activity: Not on file  Stress: Not on file  Social Connections: Not on file  Intimate Partner Violence: Not on file     No Known Allergies   Outpatient Medications Prior to Visit  Medication Sig Dispense Refill  . albuterol (PROVENTIL) (2.5 MG/3ML) 0.083% nebulizer solution Take 3 mLs (2.5 mg total) by nebulization every 6 (six) hours as needed for wheezing or shortness of breath. 75 mL 12  . ALPRAZolam (XANAX) 0.5 MG tablet Take 1 tablet (0.5 mg total) by mouth 3 (three) times daily as needed for anxiety. 20 tablet 0  . apixaban (ELIQUIS) 5 MG TABS  tablet Take 1 tablet (5 mg total) by mouth 2 (two) times daily. 60 tablet 0  . atorvastatin (LIPITOR) 80 MG tablet Take 1 tablet (80 mg total) by mouth daily. 30 tablet 0  . guaiFENesin-dextromethorphan (ROBITUSSIN DM) 100-10 MG/5ML syrup Take 10 mLs by mouth every 6 (six) hours. 118 mL 0  . melatonin 3 MG TABS tablet Take 1 tablet (3 mg total) by mouth at bedtime as needed (insomnia). 30 tablet 0  . metoprolol tartrate (LOPRESSOR) 25 MG tablet Take 1 tablet (25 mg total) by mouth 2 (two) times daily. 60 tablet 0  . Multiple Vitamins-Minerals (MULTIVITAMIN WITH MINERALS) tablet Take 1 tablet by mouth daily.    Marland Kitchen nystatin cream (MYCOSTATIN) Apply 1 application topically 2 (two) times daily. (Patient not taking: Reported on 02/08/2020) 30 g 0  . polycarbophil (FIBERCON) 625 MG tablet Take 1 tablet (625 mg total) by mouth daily. (Patient not taking: Reported on 02/08/2020) 30 tablet 0   No facility-administered medications prior to visit.    Review of Systems  Constitutional: Negative for chills, fever, malaise/fatigue and weight loss.  HENT: Negative for congestion, nosebleeds, sinus pain and sore throat.   Eyes: Negative for blurred vision.  Respiratory: Positive for cough and sputum production. Negative for hemoptysis, shortness of breath and wheezing.   Cardiovascular: Negative for chest pain, palpitations, orthopnea and leg swelling.  Gastrointestinal: Negative for abdominal pain, blood in stool, diarrhea, heartburn, nausea and vomiting.  Genitourinary: Negative for dysuria and hematuria.  Musculoskeletal: Negative for joint pain and myalgias.  Skin: Negative for itching and rash.  Neurological: Negative for dizziness, weakness and headaches.  Endo/Heme/Allergies: Does not bruise/bleed easily.  Psychiatric/Behavioral: Negative.     Objective:   Vitals:   02/08/20 1153  BP: 124/78  Pulse: 69  SpO2: 96%  Weight: 139 lb 3.2 oz (63.1 kg)  Height: 5\' 3"  (1.6 m)     Physical  Exam Constitutional:      Appearance: She is normal weight.  HENT:     Head: Normocephalic and atraumatic.     Nose: Nose normal.     Mouth/Throat:     Mouth: Mucous membranes are moist.     Pharynx: Oropharynx is clear.  Eyes:     General: No scleral icterus.    Extraocular Movements: Extraocular movements intact.     Conjunctiva/sclera: Conjunctivae normal.     Pupils: Pupils are equal, round, and reactive to light.  Neck:     Comments: Trach in place. Clean, dry, no bleeding or secretions Cardiovascular:     Rate and Rhythm: Normal rate and regular rhythm.     Pulses: Normal pulses.  Heart sounds: Normal heart sounds. No murmur heard.   Pulmonary:     Effort: Pulmonary effort is normal.     Breath sounds: Normal breath sounds. No wheezing, rhonchi or rales.  Abdominal:     General: Bowel sounds are normal.     Palpations: Abdomen is soft.  Musculoskeletal:     Right lower leg: No edema.     Left lower leg: Edema (1+) present.  Skin:    Findings: No lesion or rash.  Neurological:     General: No focal deficit present.     Mental Status: She is alert and oriented to person, place, and time. Mental status is at baseline.  Psychiatric:        Mood and Affect: Mood normal.        Behavior: Behavior normal.        Thought Content: Thought content normal.        Judgment: Judgment normal.     CBC    Component Value Date/Time   WBC 7.6 11/26/2019 0503   RBC 3.07 (L) 11/26/2019 0503   HGB 9.0 (L) 11/26/2019 0503   HCT 30.2 (L) 11/26/2019 0503   PLT 214 11/26/2019 0503   MCV 98.4 11/26/2019 0503   MCH 29.3 11/26/2019 0503   MCHC 29.8 (L) 11/26/2019 0503   RDW 13.4 11/26/2019 0503   LYMPHSABS 0.9 11/24/2019 0527   MONOABS 0.7 11/24/2019 0527   EOSABS 0.1 11/24/2019 0527   BASOSABS 0.0 11/24/2019 0527   BMP Latest Ref Rng & Units 11/26/2019 11/25/2019 11/24/2019  Glucose 70 - 99 mg/dL 96 97 -  BUN 8 - 23 mg/dL 14 16 -  Creatinine 0.44 - 1.00 mg/dL 0.95 0.97 -   Sodium 135 - 145 mmol/L 143 141 142  Potassium 3.5 - 5.1 mmol/L 4.1 3.7 3.8  Chloride 98 - 111 mmol/L 105 106 -  CO2 22 - 32 mmol/L 29 29 -  Calcium 8.9 - 10.3 mg/dL 9.4 9.2 -    Chest imaging: 11/24/19 CXR Tracheostomy tube is in place. Heart size and pulmonary vascularity are normal. Prominence of the right inferior hilum likely represents mucous plugging as seen on prior CT of 04/12/2018. However this change could also represent mass or adenopathy. Slight fibrosis in the lung bases. No consolidation or airspace disease. No pleural effusions. No pneumothorax. Calcified and tortuous aorta.  CTA Chest 04/12/2018 1.  No evidence of pulmonary embolism. 2. Severe bilateral lower lobe peribronchial thickening with scattered mucous impaction, likely acute on chronic airways inflammation. 3. Large epiglottic mass again noted, consistent with history of squamous cell carcinoma. No evidence of thoracic metastatic disease. 4.  Emphysema (ICD10-J43.9). 5.  Aortic atherosclerosis (ICD10-I70.0).  PFT: None on file  Labs: Reviewed, as above  Echo: 11/11/19 1. Left ventricular ejection fraction, by estimation, is 65 to 70%. The  left ventricle has normal function. The left ventricle has no regional  wall motion abnormalities. Left ventricular diastolic parameters are  consistent with Grade I diastolic  dysfunction (impaired relaxation).  2. Right ventricular systolic function is normal. The right ventricular  size is normal. There is normal pulmonary artery systolic pressure. The  estimated right ventricular systolic pressure is 66.0 mmHg.  3. The mitral valve is grossly normal. Trivial mitral valve  regurgitation. No evidence of mitral stenosis.  4. The aortic valve is tricuspid. There is moderate calcification of the  aortic valve. Aortic valve regurgitation is mild. Mild to moderate aortic  valve sclerosis/calcification is present, without any evidence of  aortic  stenosis.  5.  The inferior vena cava is normal in size with greater than 50%  respiratory variability, suggesting right atrial pressure of 3 mmHg. Heart Catheterization:     Assessment & Plan:   Tracheostomy care [facility])  Chronic obstructive pulmonary disease, unspecified COPD type ([place])  Discussion: Katelyn [name] is a 69 year old woman, former smoker with history of laryngeal cancer (diagnosed in 2015 but patient did not seek treatment til 2019) status post laryngectomy and tracheostomy, COPD/Emphysema, hypertension, hyperlipidemia, right leg DVT, and stroke who comes to clinic for return visit.   She is to continue robitussin DM daily or change to guaifenesin alone if able to tolerate better than the cough syrup.   She is to start using nebulizer treatments on a daily basis for bronchial hygiene and mucous clearance. She is to call the clinic should her secretions increase or become an issue to clear.    We discussed the importance of pulmonary hygiene with twice daily albuterol nebulizer treatments and suctioning of secretions. She has #6 flexible, cuffless trach in place.   She is to follow up in 4 months.  Freda Jackson, MD [facility] Office: [phone]   See Amion for Pager Details      Current Outpatient Medications:  .  albuterol (PROVENTIL) (2.5 MG/3ML) 0.083% nebulizer solution, Take 3 mLs (2.5 mg total) by nebulization every 6 (six) hours as needed for wheezing or shortness of breath., Disp: 75 mL, Rfl: 12 .  ALPRAZolam (XANAX) 0.5 MG tablet, Take 1 tablet (0.5 mg total) by mouth 3 (three) times daily as needed for anxiety., Disp: 20 tablet, Rfl: 0 .  apixaban (ELIQUIS) 5 MG TABS tablet, Take 1 tablet (5 mg total) by mouth 2 (two) times daily., Disp: 60 tablet, Rfl: 0 .  atorvastatin (LIPITOR) 80 MG tablet, Take 1 tablet (80 mg total) by mouth daily., Disp: 30 tablet, Rfl: 0 .  guaiFENesin-dextromethorphan (ROBITUSSIN DM) 100-10 MG/5ML syrup, Take 10 mLs  by mouth every 6 (six) hours., Disp: 118 mL, Rfl: 0 .  melatonin 3 MG TABS tablet, Take 1 tablet (3 mg total) by mouth at bedtime as needed (insomnia)., Disp: 30 tablet, Rfl: 0 .  metoprolol tartrate (LOPRESSOR) 25 MG tablet, Take 1 tablet (25 mg total) by mouth 2 (two) times daily., Disp: 60 tablet, Rfl: 0 .  Multiple Vitamins-Minerals (MULTIVITAMIN WITH MINERALS) tablet, Take 1 tablet by mouth daily., Disp: , Rfl:  .  nystatin cream (MYCOSTATIN), Apply 1 application topically 2 (two) times daily. (Patient not taking: Reported on 02/08/2020), Disp: 30 g, Rfl: 0 .  polycarbophil (FIBERCON) 625 MG tablet, Take 1 tablet (625 mg total) by mouth daily. (Patient not taking: Reported on 02/08/2020), Disp: 30 tablet, Rfl: 0

## 2020-02-13 ENCOUNTER — Telehealth: Payer: Self-pay

## 2020-02-13 NOTE — Telephone Encounter (Signed)
(  4:50p) SW scheduled palliative care visit with patient for 02/21/20 @11  am.

## 2020-02-14 ENCOUNTER — Encounter: Payer: Self-pay | Admitting: *Deleted

## 2020-02-14 NOTE — Patient Outreach (Signed)
[name]) Care Management  02/14/2020  Katelyn [name] March 11, 1950 [id number]  Telephone outreach to advise pt's daughter, [name], that I will be closing her mother's case now that the Palliative Care service is in place.  Encouraged her to call me if she has questions.  Wished happy holiday and a great New Year!  Goals Addressed            This Visit's Progress   . COMPLETED: [place] My Quality of Life       Follow Up Date 03/10/20  - check out options for in-home help, long-term care or hospice - do one enjoyable thing every day - make shared treatment decisions with doctor - spend time outdoors at least 3 times a week - strengthen or fix relationships with loved ones    Why is this important?   Having a long-term illness can be scary.  It can also be stressful for you and your caregiver.  These steps may help.    Notes: Palliative care consult requested. 02/02/20 Palliative care consult was done and family/pt agreed to services. Advised [name] that Mr. [name] is a barrier to her sister getting necessary care and that she needs to be present when the palliative care nurse visits to avoid disruption of appropriate care. Advising [facility] also to ensure they contact [name] when scheduling a home visit or talk with her when they need an update. 02/14/20 Palliative care active now. [facility] closing pt case.    . COMPLETED: Track and Manage My Symptoms       Follow Up Date 03/10/20  - arrange respite care for caregiver - follow rescue plan if symptoms flare-up - keep follow-up appointments    Why is this important?   Tracking your symptoms and other information about your health helps your doctor plan your care.  Write down the symptoms, the time of day, what you were doing and what medicine you are taking.  You will soon learn how to manage your symptoms.     Notes: Daughter, [name], agreed to Palliative Care consult.02/02/20 No new problems,  however, pt does not have her necessary nebs med. Difficult to assess really how pt is getting what she needs as her sisters are not in the home on a daily basis. Pt's significant other is not cooperative with talking to any care team members. Will communicate with palliative care team. 12/21/21Palliative care team to visit 02/21/20. [facility] signing off case at this time.         [name]. [name], MSN, [facility] Gerontological Nurse Practitioner [facility] Care Management [phone]

## 2020-02-16 ENCOUNTER — Telehealth: Payer: Self-pay | Admitting: Pulmonary Disease

## 2020-02-16 NOTE — Telephone Encounter (Signed)
Can we check with PCC/DME if Albuterol is on back order. I have not heard this was unavailable currently

## 2020-02-20 NOTE — Telephone Encounter (Signed)
I tried calling pt x 3 to see if she ever received her med and if not we can try DME- line rings and someone picks up but does not speak. Will call back.

## 2020-02-20 NOTE — Telephone Encounter (Signed)
Looks like it was sent to [facility] we never got any kind of order on this patient and I have not heard anything about a backorder on this

## 2020-02-20 NOTE — Telephone Encounter (Signed)
PCC's are you aware of albuterol neb solution being on back order through DMEs?

## 2020-02-21 ENCOUNTER — Other Ambulatory Visit: Payer: Self-pay

## 2020-02-21 ENCOUNTER — Other Ambulatory Visit: Payer: Medicare Other

## 2020-02-21 ENCOUNTER — Other Ambulatory Visit: Payer: Medicare Other | Admitting: *Deleted

## 2020-02-21 VITALS — BP 180/90 | HR 65 | Temp 97.9°F | Resp 18

## 2020-02-21 DIAGNOSIS — Z515 Encounter for palliative care: Secondary | ICD-10-CM

## 2020-02-21 NOTE — Progress Notes (Signed)
COMMUNITY PALLIATIVE CARE RN NOTE  PATIENT NAME: Katelyn [name] DOB: 25-Aug-1950 MRN: [id number]  PRIMARY CARE PROVIDER: Aretta Nip, MD  RESPONSIBLE PARTY:  Acct ID - Guarantor Home Phone Work Phone Relationship Acct Type  [id number] EULONDA, [phone]  Self P/F     [place], [place] [address]   Covid-19 Pre-screening Negative  PLAN OF CARE and INTERVENTION:  1. ADVANCE CARE PLANNING/GOALS OF CARE: Goal is for patient to remain in her home and avoid hospitalizations. 2. PATIENT/CAREGIVER EDUCATION: Symptom management, safe mobility/transfers, s/s of infection 3. DISEASE STATUS: Joint visit made with Palliative care SW, Monica Lonon. Met with patient and her daughter, Katelyn [name], in patient's home. Upon arrival she is sitting up at the dining table awake and alert. She has a tracheostomy tube in place, but I am able to understand her by reading her lips. She denies any pain or discomfort. Respirations are even, regular and unlabored during visit. Minimal coughing noted during visit, but she does have a non-productive cough at times. She says her phlegm is clear. She is taking Robitussin which helps. Her significant other came in during visit and states that he is noticing less mucus when he cleans her trach out twice daily since she is taking the Robitussin as she should. He is very pleasant and engaging today. He also has questions regarding the replacement trach tube he received from the hospital because he wants to change out the current one, but they were not like the one she has. He will reach out or have [name] reach out to the supply company she uses to order more. She does report some dyspnea with exertion. She says she is only having to wear her oxygen at night at 2-3 l/min via nasal cannula. She still has not received her nebulizer medications as the pharmacy is still waiting for it to arrive per Tammy. She remains independent with ADLs, but does have to take her  time and rest periods. She ambulates using a rollator walker. She really wants to travel outside of the home to specifically [place] and also would like to attend church. Her daughter's are adamant about keeping their mom out of public due to Covid, but will take her out in the car for rides to get her out of the house. Her appetite is good. She does not drink much water, but usually drinks 2 cups of coffee daily and soda. Encouraged her to drink more water to keep hydrated and help keep mucus thinner. She has occasional urinary incontinence and wears Depends. Denies any issues with bowel movements. Her BP was elevated today 180/90 with automatic cuff and 182/98 manually. Her daughter states that she likes salt and puts too much on her food. Advised to limit her salt intake. She says she feels fine. Denies dizziness, lightheadedness or headache. She has recently taken her BP medications. Advised her to lie down and have them to recheck her BP in an hour. She is not sleeping well during the night, but takes cat naps during the day. She says she feels fine and well rested. Overall, her daughter is pleased with patient's current condition lately. She has also been recently approved for Medicaid and showed me her Medicaid card. Will continue to monitor.  HISTORY OF PRESENT ILLNESS:This is a 69 yo female with a history of essential hypertension, cancer of hypopharynx, COPD, dysphagia, CKD Stage 3 and difficult airway. Palliative care team continues to follow patient and visits monthly and PRN.  CODE STATUS: Full code  ADVANCED DIRECTIVES: Y MOST FORM: no PPS: 50%   PHYSICAL EXAM:   VITALS: Today's Vitals   02/21/20 1138  BP: (!) 180/90  Pulse: 65  Resp: 18  Temp: 97.9 F (36.6 C)  TempSrc: Temporal  SpO2: 96%  PainSc: 0-No pain    LUNGS: Diminished lung sounds, but clear; No shortness of breath at rest CARDIAC: Cor irreg EXTREMITIES: Trace edema in bilateral lower extremities SKIN: Exposed  skin is dry and intact, skin around trach tube WNL  NEURO: alert and oriented x 3, generalized weakness, ambulatory w/rollator walker   (Duration of visit and documentation 75 minutes)   Daryl Eastern, RN BSN

## 2020-02-21 NOTE — Progress Notes (Signed)
COMMUNITY PALLIATIVE CARE SW NOTE  PATIENT NAME: Katelyn [name] DOB: February 02, 1951 MRN: [id number]  PRIMARY CARE PROVIDER: Aretta Nip, MD  RESPONSIBLE PARTY:  Acct ID - Guarantor Home Phone Work Phone Relationship Acct Type  [id number] DELVINA, [phone]  Self P/F     [address] [place], [place] [address]     PLAN OF CARE and INTERVENTIONS:             1. GOALS OF CARE/ ADVANCE CARE PLANNING:  Goal is for patient to remain in his home. Patient is a FULL CODE. 1.  2. SOCIAL/EMOTIONAL/SPIRITUAL ASSESSMENT/ INTERVENTIONS:  SW and RN- Daryl Eastern met with patient at her home. Her boyfriend-[name] and daughter-Tammy was present. Patient denied pain. Patient expressed her desire to be able to get out more to go shopping and to church. Her daughter expressed concerns about COVID and advised patient that she will consider this over the next few weeks with the hopes of cases going down. Patient and daughter discussed this status. Patient continues to use her walker to ambulate. However she is interested in getting a wheelchair to use when she goes out. Patient is only using her 02 at night (2/3 Liters). She is coughing up clear flym. She continues to not sleep well at night, but is taking cap naps during the day. Her appetite remains good as she is eating three meals with snacks throughout the day. She completes her own personal care. Patient's blood pressure was elevated today and she was encouraged to rest and take her blood pressure again later and report any concerns to palliative care nurse. Patient or daughter had no other concerns and remains open to ongoing support and visits by the palliative care team.  3. PATIENT/CAREGIVER EDUCATION/ COPING:   4. PERSONAL EMERGENCY PLAN:  911 can be activated for emergencies.  5. COMMUNITY RESOURCES COORDINATION/ HEALTH CARE NAVIGATION:  SW reinforced access to palliative care support. Patient request assistance with obtaining incontinent  supplies, lift chair, wheelchair and dentures. 6. FINANCIAL/LEGAL CONCERNS/INTERVENTIONS:  None.     SOCIAL HX:  Social History   Tobacco Use  . Smoking status: Former Smoker    Packs/day: 3.00    Years: 48.00    Pack years: 144.00    Types: Cigarettes    Start date: 19    Quit date: 05/29/2017    Years since quitting: 2.7  . Smokeless tobacco: Never Used  . Tobacco comment: 1 carton lasted 3 days  Substance Use Topics  . Alcohol use: No    Alcohol/week: 0.0 standard drinks    CODE STATUS: FULL CODE ADVANCED DIRECTIVES: No MOST FORM COMPLETE:  No HOSPICE EDUCATION PROVIDED: No  PPS: Patient is alert and oriented x 4. She ambulates with a walker. Patient is independent for personal care needs.  Duration of visit and documentation: 60 minutes.      [address] [place], [place]

## 2020-02-27 ENCOUNTER — Emergency Department (HOSPITAL_BASED_OUTPATIENT_CLINIC_OR_DEPARTMENT_OTHER): Payer: Medicare Other

## 2020-02-27 ENCOUNTER — Other Ambulatory Visit: Payer: Self-pay

## 2020-02-27 ENCOUNTER — Emergency Department (HOSPITAL_BASED_OUTPATIENT_CLINIC_OR_DEPARTMENT_OTHER)
Admission: EM | Admit: 2020-02-27 | Discharge: 2020-02-27 | Disposition: A | Discharge status: Home / Self Care | Payer: Medicare Other | Attending: Emergency Medicine | Admitting: Emergency Medicine

## 2020-02-27 ENCOUNTER — Encounter (HOSPITAL_BASED_OUTPATIENT_CLINIC_OR_DEPARTMENT_OTHER): Payer: Self-pay | Admitting: *Deleted

## 2020-02-27 DIAGNOSIS — Z79899 Other long term (current) drug therapy: Secondary | ICD-10-CM | POA: Diagnosis not present

## 2020-02-27 DIAGNOSIS — S50811A Abrasion of right forearm, initial encounter: Secondary | ICD-10-CM | POA: Insufficient documentation

## 2020-02-27 DIAGNOSIS — N183 Chronic kidney disease, stage 3 unspecified: Secondary | ICD-10-CM | POA: Diagnosis not present

## 2020-02-27 DIAGNOSIS — X58XXXA Exposure to other specified factors, initial encounter: Secondary | ICD-10-CM | POA: Insufficient documentation

## 2020-02-27 DIAGNOSIS — R531 Weakness: Secondary | ICD-10-CM | POA: Diagnosis not present

## 2020-02-27 DIAGNOSIS — I10 Essential (primary) hypertension: Secondary | ICD-10-CM | POA: Diagnosis not present

## 2020-02-27 DIAGNOSIS — Z85818 Personal history of malignant neoplasm of other sites of lip, oral cavity, and pharynx: Secondary | ICD-10-CM | POA: Diagnosis not present

## 2020-02-27 DIAGNOSIS — I509 Heart failure, unspecified: Secondary | ICD-10-CM | POA: Diagnosis not present

## 2020-02-27 DIAGNOSIS — R600 Localized edema: Secondary | ICD-10-CM | POA: Diagnosis not present

## 2020-02-27 DIAGNOSIS — R7989 Other specified abnormal findings of blood chemistry: Secondary | ICD-10-CM | POA: Insufficient documentation

## 2020-02-27 DIAGNOSIS — Z87891 Personal history of nicotine dependence: Secondary | ICD-10-CM | POA: Insufficient documentation

## 2020-02-27 DIAGNOSIS — I130 Hypertensive heart and chronic kidney disease with heart failure and stage 1 through stage 4 chronic kidney disease, or unspecified chronic kidney disease: Secondary | ICD-10-CM | POA: Insufficient documentation

## 2020-02-27 DIAGNOSIS — J449 Chronic obstructive pulmonary disease, unspecified: Secondary | ICD-10-CM | POA: Insufficient documentation

## 2020-02-27 DIAGNOSIS — M25531 Pain in right wrist: Secondary | ICD-10-CM | POA: Diagnosis not present

## 2020-02-27 DIAGNOSIS — Z8673 Personal history of transient ischemic attack (TIA), and cerebral infarction without residual deficits: Secondary | ICD-10-CM | POA: Insufficient documentation

## 2020-02-27 DIAGNOSIS — S6991XA Unspecified injury of right wrist, hand and finger(s), initial encounter: Secondary | ICD-10-CM | POA: Diagnosis present

## 2020-02-27 DIAGNOSIS — M7989 Other specified soft tissue disorders: Secondary | ICD-10-CM | POA: Diagnosis not present

## 2020-02-27 HISTORY — DX: Heart failure, unspecified: I50.9

## 2020-02-27 HISTORY — DX: Chronic obstructive pulmonary disease, unspecified: J44.9

## 2020-02-27 LAB — COMPREHENSIVE METABOLIC PANEL
ALT: 39 U/L (ref 0–44)
AST: 34 U/L (ref 15–41)
Albumin: 4 g/dL (ref 3.5–5.0)
Alkaline Phosphatase: 132 U/L — ABNORMAL HIGH (ref 38–126)
Anion gap: 9 (ref 5–15)
BUN: 23 mg/dL (ref 8–23)
CO2: 28 mmol/L (ref 22–32)
Calcium: 9.4 mg/dL (ref 8.9–10.3)
Chloride: 103 mmol/L (ref 98–111)
Creatinine, Ser: 0.82 mg/dL (ref 0.44–1.00)
GFR, Estimated: >60 mL/min (ref >60)
Glucose, Bld: 103 mg/dL — ABNORMAL HIGH (ref 70–99)
Potassium: 3.7 mmol/L (ref 3.5–5.1)
Sodium: 140 mmol/L (ref 135–145)
Total Bilirubin: 0.3 mg/dL (ref 0.3–1.2)
Total Protein: 7.7 g/dL (ref 6.5–8.1)

## 2020-02-27 LAB — CBC WITH DIFFERENTIAL/PLATELET
Abs Immature Granulocytes: 0.01 10*3/uL (ref 0.00–0.07)
Basophils Absolute: 0 10*3/uL (ref 0.0–0.1)
Basophils Relative: 0 %
Eosinophils Absolute: 0.1 10*3/uL (ref 0.0–0.5)
Eosinophils Relative: 1 %
HCT: 38.2 % (ref 36.0–46.0)
Hemoglobin: 11.9 g/dL — ABNORMAL LOW (ref 12.0–15.0)
Immature Granulocytes: 0 %
Lymphocytes Relative: 25 %
Lymphs Abs: 1.9 10*3/uL (ref 0.7–4.0)
MCH: 29.4 pg (ref 26.0–34.0)
MCHC: 31.2 g/dL (ref 30.0–36.0)
MCV: 94.3 fL (ref 80.0–100.0)
Monocytes Absolute: 0.7 10*3/uL (ref 0.1–1.0)
Monocytes Relative: 9 %
Neutro Abs: 4.8 10*3/uL (ref 1.7–7.7)
Neutrophils Relative %: 65 %
Platelets: 181 10*3/uL (ref 150–400)
RBC: 4.05 MIL/uL (ref 3.87–5.11)
RDW: 12.2 % (ref 11.5–15.5)
WBC: 7.3 10*3/uL (ref 4.0–10.5)
nRBC: 0 % (ref 0.0–0.2)

## 2020-02-27 LAB — BRAIN NATRIURETIC PEPTIDE: B Natriuretic Peptide: 106.7 pg/mL — ABNORMAL HIGH (ref 0.0–100.0)

## 2020-02-27 MED ORDER — FUROSEMIDE 20 MG PO TABS: 20.0000 mg | ORAL_TABLET | Freq: Every day | ORAL | 0 refills | Status: DC

## 2020-02-27 MED ORDER — FUROSEMIDE 20 MG PO TABS
20.0000 mg | ORAL_TABLET | Freq: Once | ORAL | Status: AC
  Administered 2020-02-27: 20 mg via ORAL
  Filled 2020-02-27: qty 1

## 2020-02-27 NOTE — ED Notes (Signed)
Patient placed back on home O2 ,  Taken via wheelchair to car

## 2020-02-27 NOTE — Discharge Instructions (Signed)
Follow up with your family doc.  Return for worsening pain, shortness of breath, fever.  One sided numbness or weakness, difficulty with speech or swallowing.

## 2020-02-27 NOTE — ED Triage Notes (Addendum)
C/o right side increased weakness and increased BP, increased bil leg swelling   x 3 days, denies h/a

## 2020-02-27 NOTE — Progress Notes (Signed)
Pt with cuffless #6 Flexible shiley trach in place. No breakdown to the skin noted. Pt arrives on venturi mask via trach collar 6L/35%. Inner cannula remains clear at this time. Bilateral breath sounds diminished/rhonchi. Pt refuses to be suctioned at this time. RT will continue to monitor and be available as needed.

## 2020-02-27 NOTE — ED Provider Notes (Signed)
[facility] EMERGENCY DEPARTMENT Provider Note   CSN: [id number] Arrival date & time: 02/27/20  1613     History Chief Complaint  Patient presents with  . Weakness    Katelyn [name] is a 70 y.o. female.  70 yo F with a chief complaint of right wrist pain.  This been going on for a few days.  Feels like her arthritis.  Diffuse pain about the wrist.  Worse with pronation and supination.  Feels like she has been having trouble holding onto her pan or writing her name.  She denies pain to the elbow or the shoulder.  Denies fevers denies trauma.  She is also been having some worsening lower extremity edema.  This happens off and on to her.  Has been on fluid pills off and on as well.  Not on them for some time.  Denies worsening trouble breathing denies recent change in medication.  She called her family doctor today but unfortunately they are out of the office and suggested that she come to the emergency department for evaluation.  The history is provided by the patient and a relative.  Weakness Associated symptoms: arthralgias and myalgias   Associated symptoms: no chest pain, no dizziness, no dysuria, no fever, no headaches, no nausea, no shortness of breath, no urgency and no vomiting   Illness Severity:  Moderate Onset quality:  Gradual Duration:  3 days Timing:  Constant Progression:  Worsening Chronicity:  New Associated symptoms: myalgias   Associated symptoms: no chest pain, no congestion, no fever, no headaches, no nausea, no rhinorrhea, no shortness of breath, no vomiting and no wheezing        Past Medical History:  Diagnosis Date  . Acute gallstone pancreatitis 01/08/2014  . AKI (acute kidney injury) ([place]) 01/08/2014  . Anemia   . Blood transfusion    11'15 last admission  . Cancer ([place])   . CHF (congestive heart failure) ([place])   . Cholecystitis   . COPD (chronic obstructive pulmonary disease) ([place])   . H/O tracheostomy   . Hip fracture ([place])   .  Hypercholesteremia   . Hypertension   . Right leg DVT ([place]) 05/08/2014  . Septic shock - Kelbsiella 01/09/2014  . Shortness of breath   . Stroke [facility])    '13- right side weakness, ambulates with cane    Patient Active Problem List   Diagnosis Date Noted  . Acute respiratory failure ([place]) 11/24/2019  . Acute on chronic respiratory failure with hypoxia and hypercapnia (HCC)   . Acute respiratory failure requiring reintubation ([place]) 11/11/2019  . Loose stools 11/06/2019  . Benign essential HTN   . Chronic obstructive pulmonary disease ([place])   . Debility 10/25/2019  . Acute hypoxemic respiratory failure ([place]) 10/21/2019  . Goals of care, counseling/discussion   . Palliative care by specialist   . History of noncompliance with medical treatment   . COPD exacerbation ([place]) 10/06/2019  . Generalized weakness 07/12/2017  . Palliative care encounter 07/12/2017  . 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 Pressure injury of skin 05/30/2017  . Malnutrition of moderate degree 05/30/2017  . Hip fx, right, closed, initial encounter ([place]) 05/29/2017  . Closed displaced intertrochanteric fracture of right femur ([place]) 05/29/2017  . Dysphagia 05/29/2017  . Weight loss 05/29/2017  . Difficult airway for intubation 05/29/2017  . Lesion of epiglottis 05/26/2014  . Abnormal CT scan, pancreas or bile duct   . Right leg DVT ([place]) 05/08/2014  . Anemia 01/09/2014  .  Hip pain, right 05/06/2011  . 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 CKD (chronic kidney disease) stage 3, GFR 30-59 ml/min (HCC) 03/04/2011  . Diastolic dysfunction 03/04/2011  . Tobacco abuse 03/04/2011  . Constipation 03/04/2011  . Malignant hypertensive urgency 03/01/2011  . Cerebral infarction (HCC) 03/01/2011  . NEUROPATHY, IDIOPATHIC 10/25/2007  . Hypercholesteremia 12/15/2006  . Essential hypertension 12/15/2006    Past Surgical History:  Procedure Laterality Date  . ABDOMINAL HYSTERECTOMY    . DIRECT  LARYNGOSCOPY N/A 06/08/2017   Procedure: DIRECT LARYNGOSCOPY WITH BIOPSY;  Surgeon: Graylin Shiver, MD;  Location: [facility] OR;  Service: ENT;  Laterality: N/A;  . ENDOSCOPIC RETROGRADE CHOLANGIOPANCREATOGRAPHY (ERCP) WITH PROPOFOL N/A 01/11/2014   Procedure: ENDOSCOPIC RETROGRADE CHOLANGIOPANCREATOGRAPHY (ERCP) WITH PROPOFOL;  Surgeon: Iva Boop, MD;  Location: [facility] ENDOSCOPY;  Service: Endoscopy;  Laterality: N/A;  . ERCP    . ERCP N/A 05/26/2014   Procedure: ENDOSCOPIC RETROGRADE CHOLANGIOPANCREATOGRAPHY (ERCP);  Surgeon: Iva Boop, MD;  Location: [facility] ENDOSCOPY;  Service: Endoscopy;  Laterality: N/A;  . ESOPHAGOGASTRODUODENOSCOPY (EGD) WITH PROPOFOL N/A 12/07/2014   Procedure: ESOPHAGOGASTRODUODENOSCOPY (EGD) WITH PROPOFOL;  Surgeon: Rachael Fee, MD;  Location: [facility] ENDOSCOPY;  Service: Endoscopy;  Laterality: N/A;  . EUS N/A 12/07/2014   Procedure: UPPER ENDOSCOPIC ULTRASOUND (EUS) LINEAR;  Surgeon: Rachael Fee, MD;  Location: [facility] ENDOSCOPY;  Service: Endoscopy;  Laterality: N/A;  . GASTROINTESTINAL STENT REMOVAL N/A 12/07/2014   Procedure: GASTROINTESTINAL STENT REMOVAL;  Surgeon: Rachael Fee, MD;  Location: [facility] ENDOSCOPY;  Service: Endoscopy;  Laterality: N/A;  pancreatic stent removal  . HIP FRACTURE SURGERY    . INTRAMEDULLARY (IM) NAIL INTERTROCHANTERIC Right 05/29/2017   Procedure: INTRAMEDULLARY (IM) NAIL INTERTROCHANTRIC FEMUR;  Surgeon: Yolonda Kida, MD;  Location: [facility] OR;  Service: Orthopedics;  Laterality: Right;  . PERCUTANEOUS TRACHEOSTOMY N/A 05/29/2017   Procedure: PERCUTANEOUS TRACHEOSTOMY;  Surgeon: Jimmye Norman, MD;  Location: [facility] OR;  Service: General;  Laterality: N/A;  . TEE WITHOUT CARDIOVERSION  03/06/2011   Procedure: TRANSESOPHAGEAL ECHOCARDIOGRAM (TEE);  Surgeon: Dolores Patty, MD;  Location: [facility] ENDOSCOPY;  Service: Cardiovascular;  Laterality: N/A;     OB History   No obstetric history on file.     Family History  Problem Relation Age of  Onset  . Breast cancer Maternal Aunt   . Diabetes Mellitus I Father   . Diabetes Mellitus I Sister   . Anesthesia problems Neg Hx   . Hypotension Neg Hx   . Malignant hyperthermia Neg Hx   . Pseudochol deficiency Neg Hx   . Colon cancer Neg Hx   . Esophageal cancer Neg Hx   . Pancreatic cancer Neg Hx   . Kidney disease Neg Hx   . Liver disease Neg Hx     Social History   Tobacco Use  . Smoking status: Former Smoker    Packs/day: 3.00    Years: 48.00    Pack years: 144.00    Types: Cigarettes    Start date: 48    Quit date: 05/29/2017    Years since quitting: 2.7  . Smokeless tobacco: Never Used  . Tobacco comment: 1 carton lasted 3 days  Substance Use Topics  . Alcohol use: No    Alcohol/week: 0.0 standard drinks  . Drug use: No    Home Medications Prior to Admission medications   Medication Sig Start Date End Date Taking? Authorizing Provider  furosemide (LASIX) 20 MG tablet Take 1 tablet (20 mg total) by mouth daily  for 3 days. 02/27/20 03/01/20 Yes Tyrone Nine, Anaclara Acklin, DO  albuterol (PROVENTIL) (2.5 MG/3ML) 0.083% nebulizer solution Take 3 mLs (2.5 mg total) by nebulization every 6 (six) hours as needed for wheezing or shortness of breath. 02/03/20   Freddi Starr, MD  ALPRAZolam Duanne Moron) 0.5 MG tablet Take 1 tablet (0.5 mg total) by mouth 3 (three) times daily as needed for anxiety. 11/16/19   Little Ishikawa, MD  apixaban (ELIQUIS) 5 MG TABS tablet Take 1 tablet (5 mg total) by mouth 2 (two) times daily. 11/04/19   Love, Ivan Anchors, PA-C  atorvastatin (LIPITOR) 80 MG tablet Take 1 tablet (80 mg total) by mouth daily. 10/18/19   Swayze, Ava, DO  guaiFENesin-dextromethorphan (ROBITUSSIN DM) 100-10 MG/5ML syrup Take 10 mLs by mouth every 6 (six) hours. 10/18/19   Swayze, Ava, DO  melatonin 3 MG TABS tablet Take 1 tablet (3 mg total) by mouth at bedtime as needed (insomnia). 11/04/19   Love, Ivan Anchors, PA-C  metoprolol tartrate (LOPRESSOR) 25 MG tablet Take 1 tablet (25 mg total) by  mouth 2 (two) times daily. 11/04/19   Love, Ivan Anchors, PA-C  Multiple Vitamins-Minerals (MULTIVITAMIN WITH MINERALS) tablet Take 1 tablet by mouth daily.    [provider]  nystatin cream (MYCOSTATIN) Apply 1 application topically 2 (two) times daily. Patient not taking: Reported on 02/08/2020 11/04/19   Love, Ivan Anchors, PA-C  polycarbophil (FIBERCON) 625 MG tablet Take 1 tablet (625 mg total) by mouth daily. Patient not taking: Reported on 02/08/2020 11/05/19   Bary Leriche, PA-C    Allergies    Patient has no known allergies.  Review of Systems   Review of Systems  Constitutional: Negative for chills and fever.  HENT: Negative for congestion and rhinorrhea.   Eyes: Negative for redness and visual disturbance.  Respiratory: Negative for shortness of breath and wheezing.   Cardiovascular: Positive for leg swelling. Negative for chest pain and palpitations.  Gastrointestinal: Negative for nausea and vomiting.  Genitourinary: Negative for dysuria and urgency.  Musculoskeletal: Positive for arthralgias and myalgias.  Skin: Negative for pallor and wound.  Neurological: Positive for weakness. Negative for dizziness and headaches.    Physical Exam Updated Vital Signs BP (!) 208/97   Pulse 73   Temp 98 F (36.7 C) (Oral)   Resp 20   Ht 4\' 10"  (1.473 m)   Wt 63.5 kg   SpO2 100%   BMI 29.26 kg/m   Physical Exam Vitals and nursing note reviewed.  Constitutional:      General: She is not in acute distress.    Appearance: She is well-developed and well-nourished. She is not diaphoretic.  HENT:     Head: Normocephalic and atraumatic.  Eyes:     Extraocular Movements: EOM normal.     Pupils: Pupils are equal, round, and reactive to light.  Cardiovascular:     Rate and Rhythm: Normal rate and regular rhythm.     Heart sounds: No murmur heard. No friction rub. No gallop.   Pulmonary:     Effort: Pulmonary effort is normal.     Breath sounds: No wheezing or rales.   Abdominal:     General: There is no distension.     Palpations: Abdomen is soft.     Tenderness: There is no abdominal tenderness.  Musculoskeletal:        General: Tenderness present. No edema.     Cervical back: Normal range of motion and neck supple.     Comments:  No appreciable swelling to the wrist.  Pulse motor and sensation are intact.  5-5 muscle strength compared to the other side.  Mild pain with pronation and supination.  Seems to be mildly tender with Tinel's test to the median canal.  Full range of motion of the elbow.  No erythema or warmth.  There is a small abrasion that is about mid forearm on the dorsal aspect.  No obvious vesicular lesions.  Lower extremity edema left greater than right 2+ compared to trace.  Skin:    General: Skin is warm and dry.  Neurological:     Mental Status: She is alert and oriented to person, place, and time.  Psychiatric:        Mood and Affect: Mood and affect normal.        Behavior: Behavior normal.     ED Results / Procedures / Treatments   Labs (all labs ordered are listed, but only abnormal results are displayed) Labs Reviewed  CBC WITH DIFFERENTIAL/PLATELET - Abnormal; Notable for the following components:      Result Value   Hemoglobin 11.9 (*)    All other components within normal limits  COMPREHENSIVE METABOLIC PANEL - Abnormal; Notable for the following components:   Glucose, Bld 103 (*)    Alkaline Phosphatase 132 (*)    All other components within normal limits  BRAIN NATRIURETIC PEPTIDE - Abnormal; Notable for the following components:   B Natriuretic Peptide 106.7 (*)    All other components within normal limits    EKG None  Radiology DG Chest Port 1 View  Result Date: 02/27/2020 CLINICAL DATA:  Leg swelling.  Weakness.  Hypertension. EXAM: PORTABLE CHEST 1 VIEW COMPARISON:  11/24/2019 FINDINGS: Tracheostomy in place. Left chest is clear. Mild atelectasis or infiltrate at the right lung base. IMPRESSION: Mild  atelectasis or infiltrate at the right lung base. Electronically Signed   By: [name] Chimes M.D.   On: 02/27/2020 18:19    Procedures Procedures (including critical care time)  Medications Ordered in ED Medications  furosemide (LASIX) tablet 20 mg (has no administration in time range)    ED Course  I have reviewed the triage vital signs and the nursing notes.  Pertinent labs & imaging results that were available during my care of the patient were reviewed by me and considered in my medical decision making (see chart for details).    MDM Rules/Calculators/A&P                          70 yo F chronically ill seeing palliative care with a trach.  Patient has been having pain to her right wrist and lower extremity edema also her blood pressure had been checked at home and was slightly elevated called her family doctor who suggested she come to the ED for evaluation.  Patient think she has had worsening of her arthritis to her right wrist.  I feel that her history and physical are likely consistent with the same.  She does have a small rash to the dorsal aspect of the right forearm that could represent shingles though the patient thinks that the pain did not correlate with the appearance of the rash.  Patient is also complaining of lower extremity edema left greater than right.  She has had swelling like this previously where the left is greater than the right.  Typically does courses of diuretics though not currently on it.  Will obtain a laboratory evaluation to  check for renal dysfunction or signs of early heart failure.  She is had no worsening difficulty with breathing.  Likely discharge home on a short course of diuretics.  Patient's lab work has resulted without significant finding.  BNP is mildly elevated though looks like it has been below what she has had previously.  Chest x-ray viewed by me without focal infiltrate though read as radiology is possible atelectasis or infiltrate in the  right lung base.  Patient not having any increased cough or fevers.  Suspect this is atelectasis.  We will discharge the patient home.  Give her 3 days worth of Lasix.  PCP follow-up in the office.  6:28 PM:  I have discussed the diagnosis/risks/treatment options with the patient and family and believe the pt to be eligible for discharge home to follow-up with PCP. We also discussed returning to the ED immediately if new or worsening sx occur. We discussed the sx which are most concerning (e.g., sudden worsening pain, fever, inability to tolerate by mouth) that necessitate immediate return. Medications administered to the patient during their visit and any new prescriptions provided to the patient are listed below.  Medications given during this visit Medications  furosemide (LASIX) tablet 20 mg (has no administration in time range)     The patient appears reasonably screen and/or stabilized for discharge and I doubt any other medical condition or other [facility] requiring further screening, evaluation, or treatment in the ED at this time prior to discharge.     Final Clinical Impression(s) / ED Diagnoses Final diagnoses:  Right wrist pain  Leg edema    Rx / DC Orders ED Discharge Orders         Ordered    furosemide (LASIX) 20 MG tablet  Daily        02/27/20 1826           Deno Etienne, DO 02/27/20 1828

## 2020-02-27 NOTE — ED Notes (Signed)
Patient c/o right wrist pain, increased edema to bilateral lower extremities

## 2020-02-29 DIAGNOSIS — J9811 Atelectasis: Secondary | ICD-10-CM | POA: Diagnosis not present

## 2020-02-29 DIAGNOSIS — Z930 Tracheostomy status: Secondary | ICD-10-CM | POA: Diagnosis not present

## 2020-02-29 DIAGNOSIS — M1811 Unilateral primary osteoarthritis of first carpometacarpal joint, right hand: Secondary | ICD-10-CM | POA: Diagnosis not present

## 2020-02-29 DIAGNOSIS — M654 Radial styloid tenosynovitis [de Quervain]: Secondary | ICD-10-CM | POA: Diagnosis not present

## 2020-02-29 DIAGNOSIS — I10 Essential (primary) hypertension: Secondary | ICD-10-CM | POA: Diagnosis not present

## 2020-03-05 ENCOUNTER — Other Ambulatory Visit: Payer: Self-pay | Admitting: *Deleted

## 2020-03-05 NOTE — Patient Outreach (Signed)
[facility]) Care Management  03/05/2020  Jonah Nestle 31-Aug-1950 [id number]  Pt was not called. She was previously discharged from [facility] care management as she is being followed by Palliative Care now.  [name]. [name], MSN, [facility] Gerontological Nurse Practitioner [facility] Care Management [phone]

## 2020-03-14 IMAGING — DX DG CHEST 1V PORT
1 series · 1 of 1 positions shown · non-contrast
Comparison: 05/29/2017 at [DATE] a.m.

CLINICAL DATA: Respiratory failure.  Tracheostomy check.

EXAM:
PORTABLE CHEST 1 VIEW

[chest]
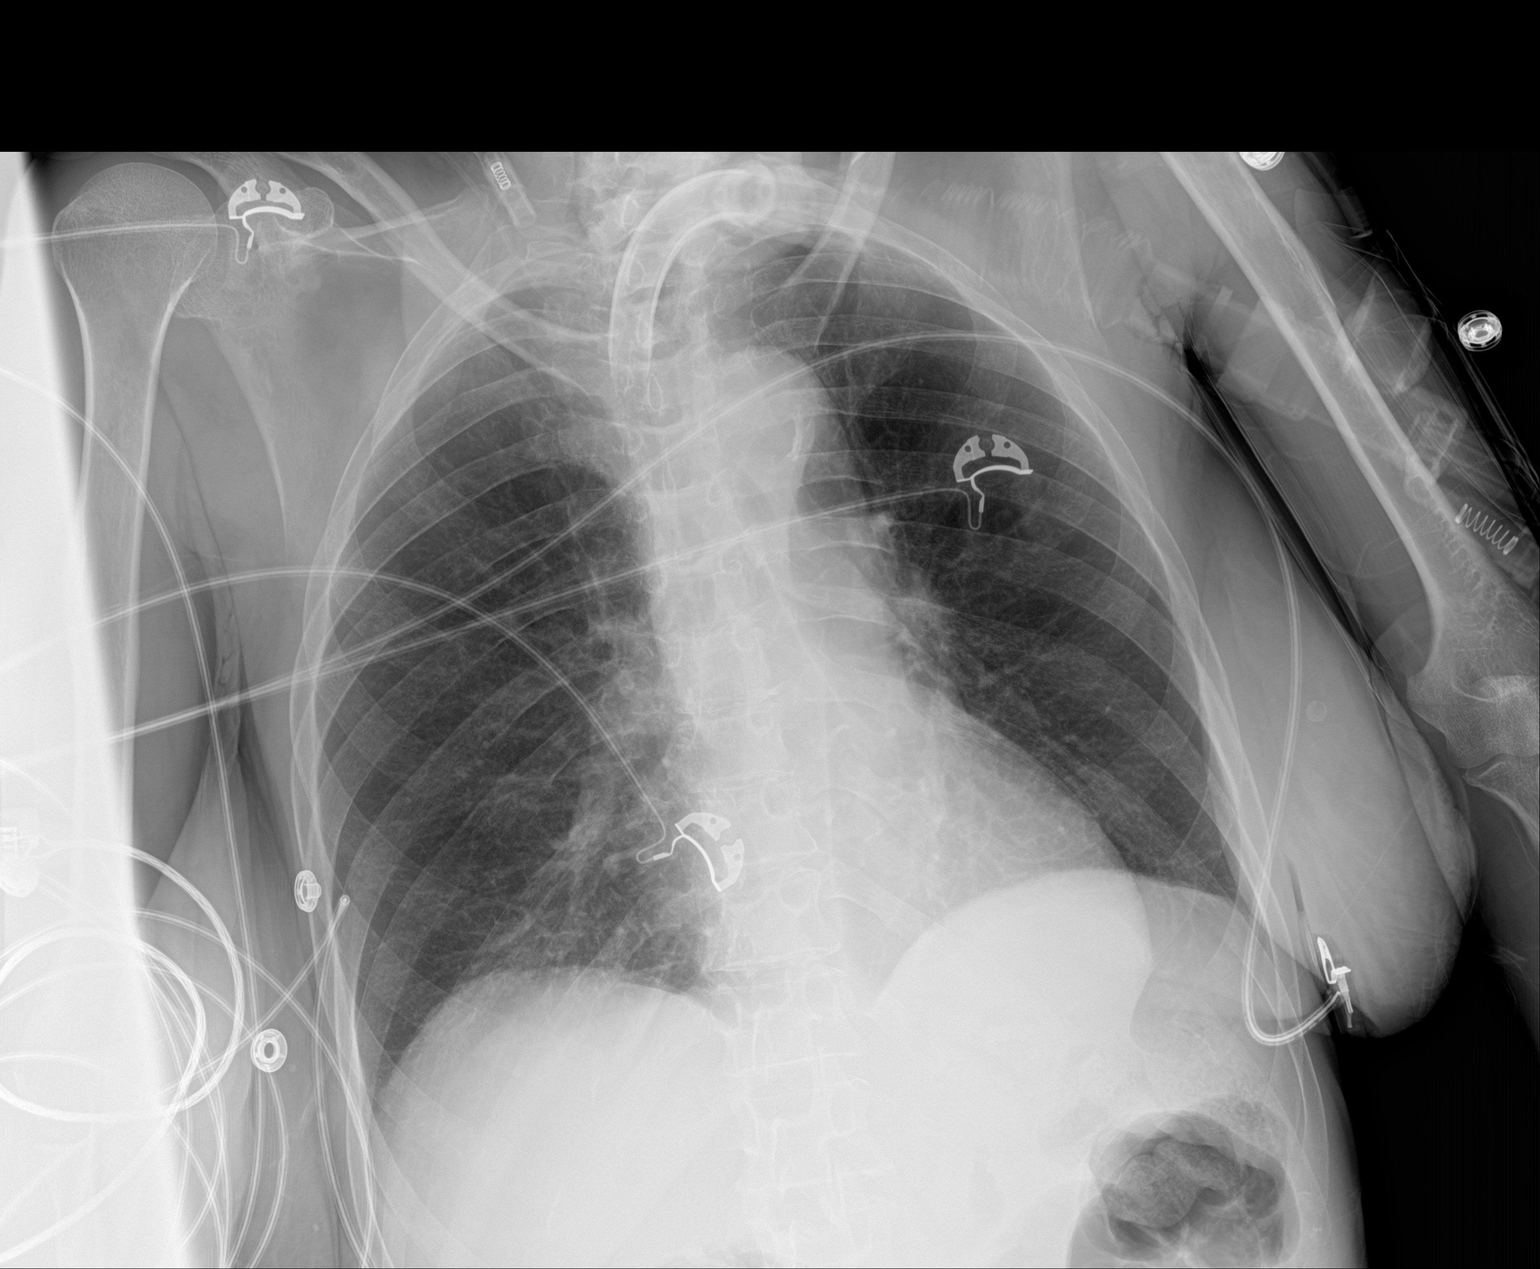

[1 of 1 positions shown; findings below may reference images not displayed]

FINDINGS: Tracheostomy has been placed since the earlier exam. Tip projects in
the upper thoracic trachea.

Cardiac silhouette is normal in size. No mediastinal or hilar
masses.

Lungs clear allowing for the supine positioning.
IMPRESSION: 1. Well-positioned tracheostomy tube.
2. No acute cardiopulmonary disease.

## 2020-03-14 IMAGING — DX DG HIP (WITH OR WITHOUT PELVIS) 2-3V*R*
3 series · 3 of 3 positions shown · non-contrast
Comparison: None.

CLINICAL DATA: Pain following fall

EXAM:
DG HIP (WITH OR WITHOUT PELVIS) 2-3V RIGHT

[pelvis ap]
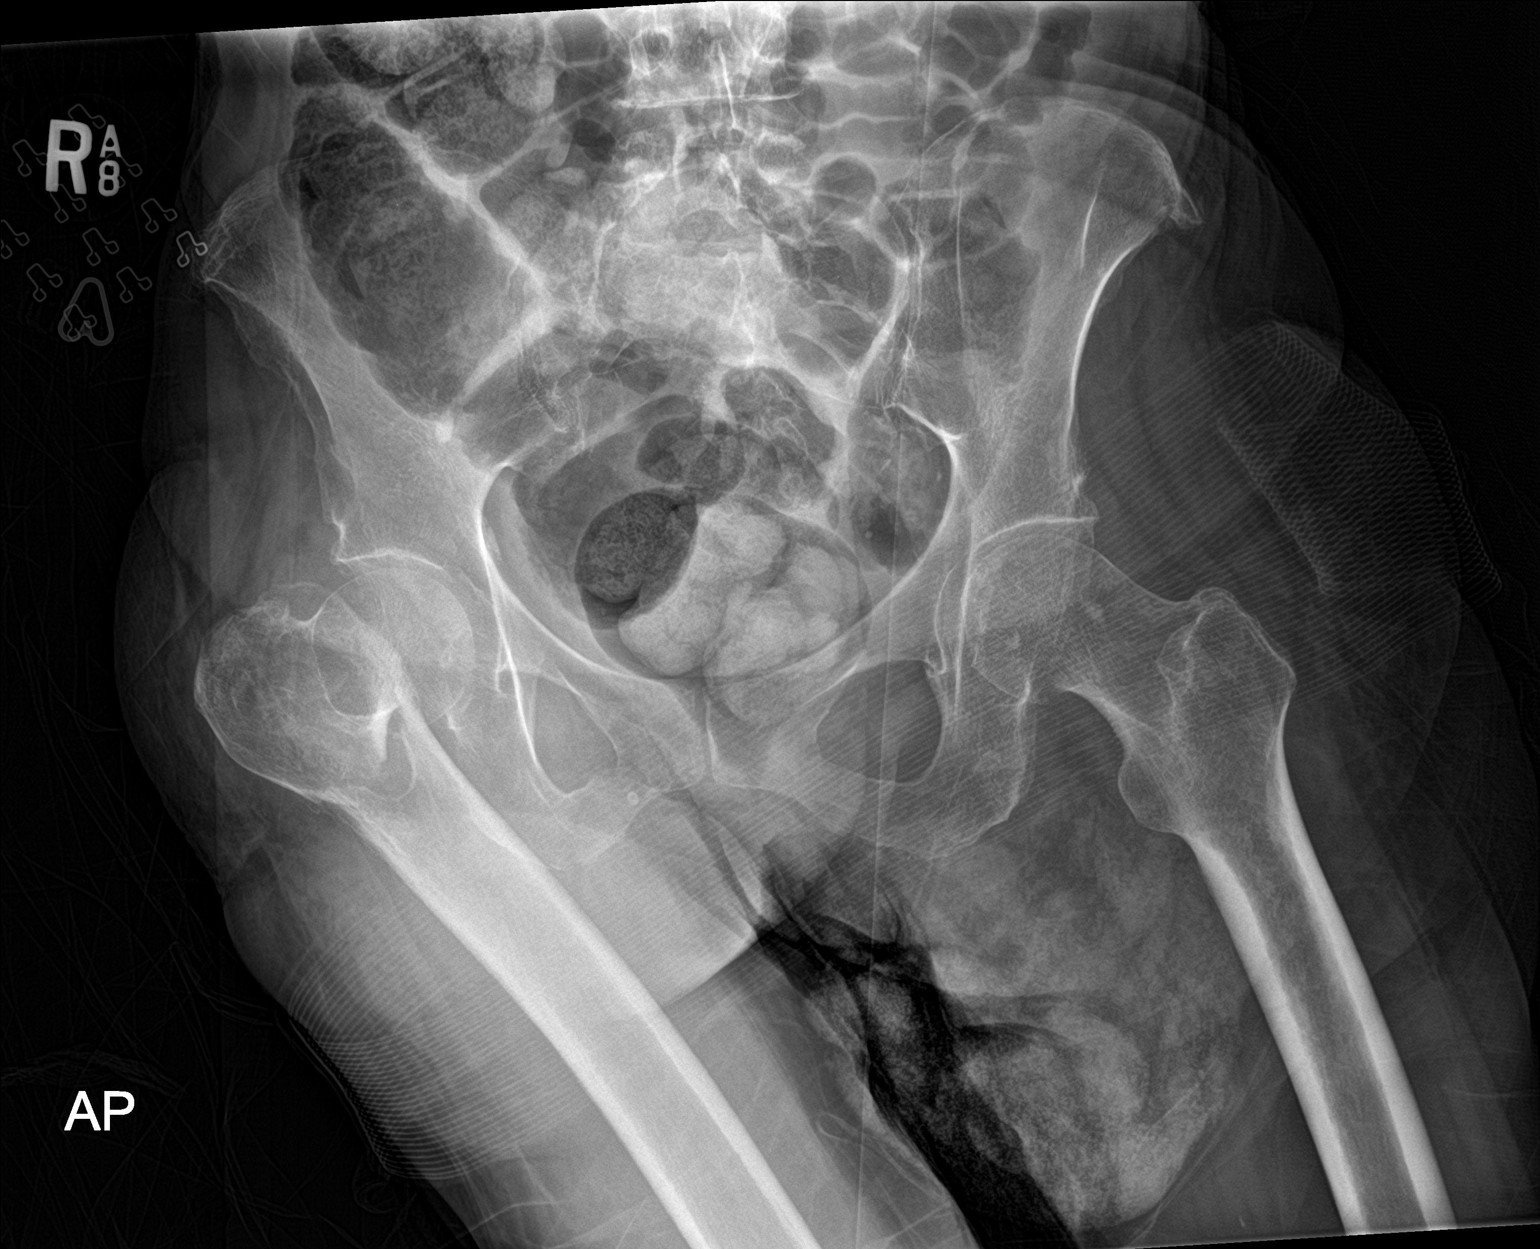

[hip ap]
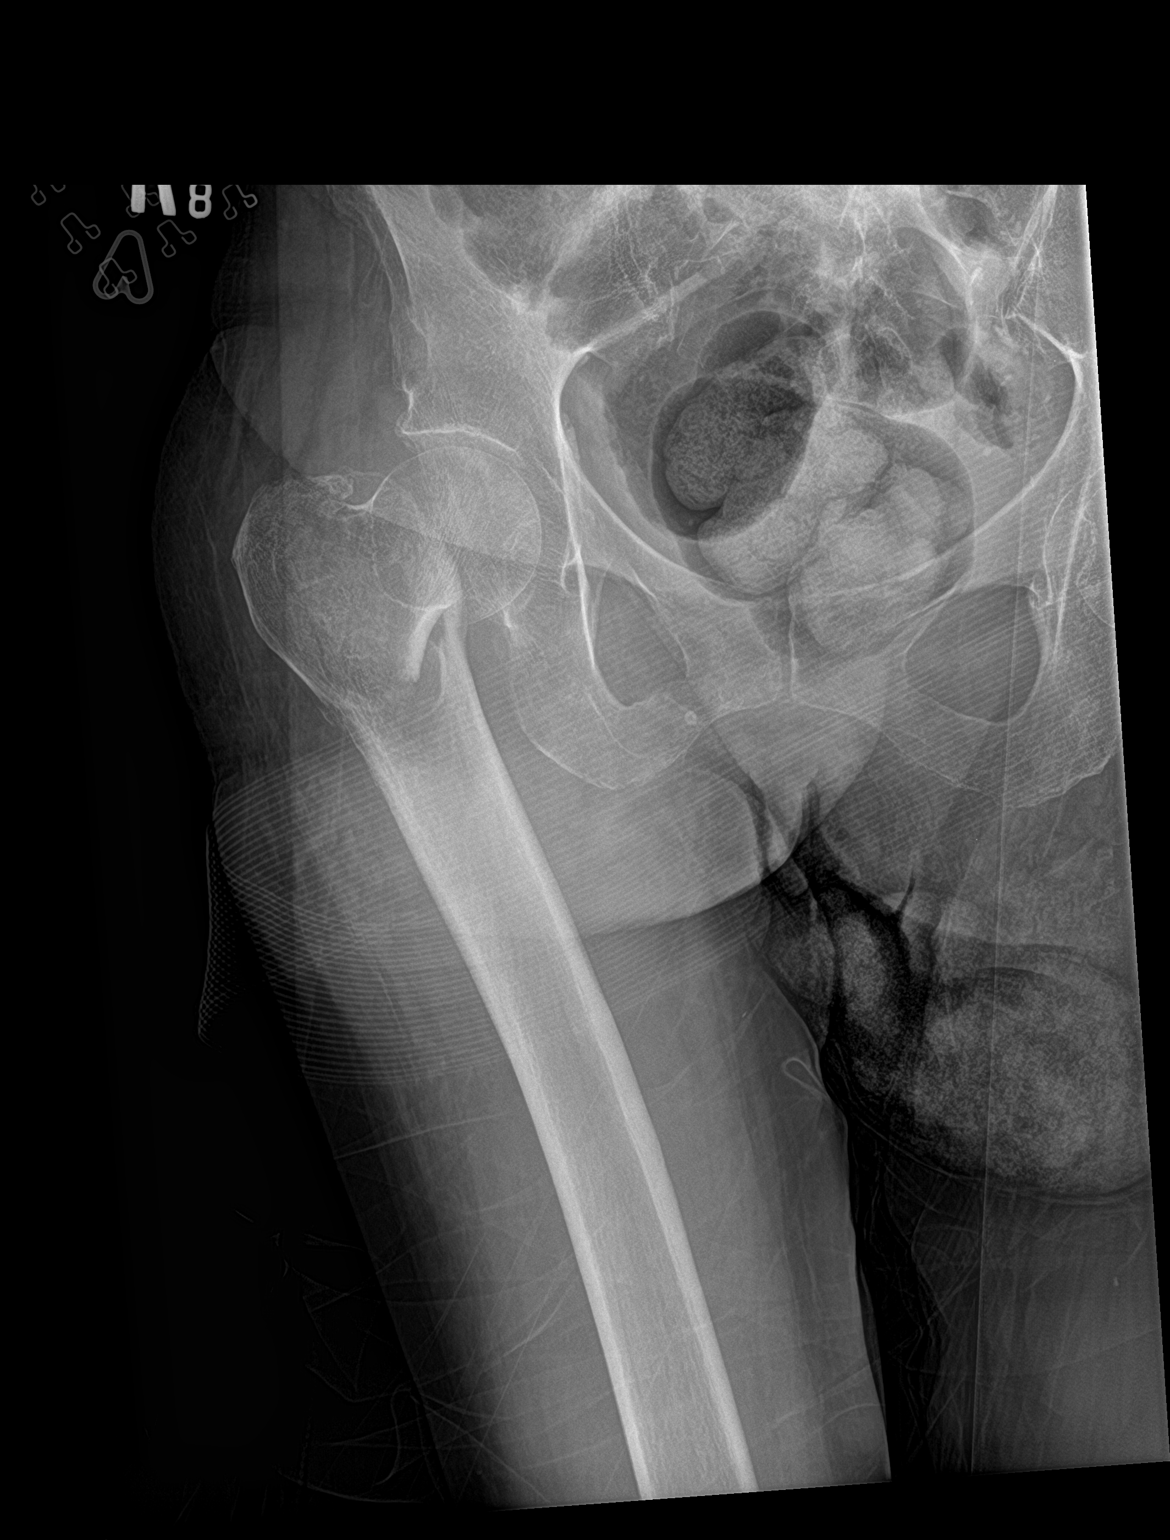

[hip x-table]
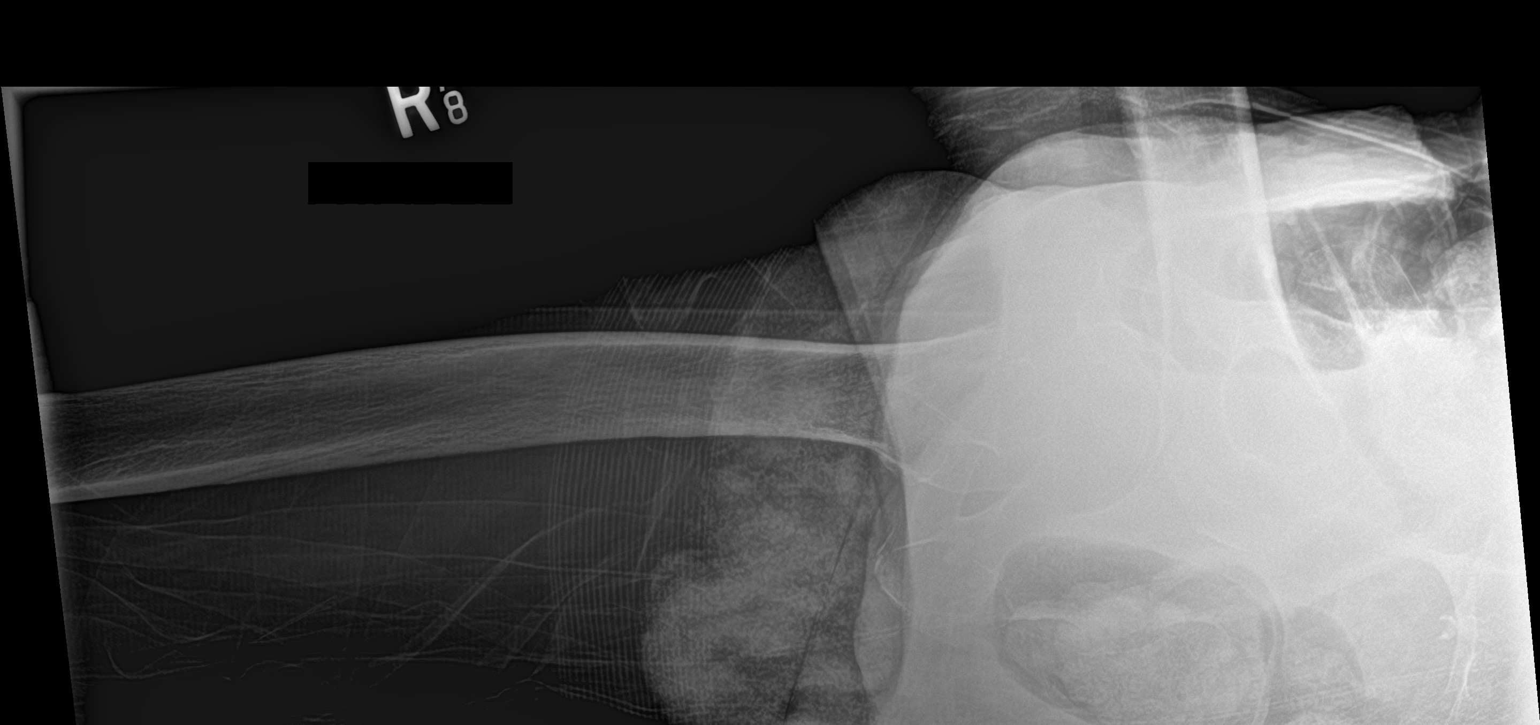

[3 of 3 positions shown; findings below may reference images not displayed]

FINDINGS: Frontal pelvis as well as frontal and lateral right hip images were
obtained. There is an intertrochanteric fracture of the right femur
with varus angulation at the fracture site. No other fracture. No
dislocation. There is mild symmetric narrowing of both hip joints.
IMPRESSION: Intertrochanteric femur fracture on the right with varus angulation
at the fracture site. No other fracture. No dislocation. Mild
symmetric narrowing of each hip joint.

## 2020-03-14 IMAGING — DX DG CHEST 1V
1 series · 1 of 1 positions shown · non-contrast
Comparison: March 02, 2014

CLINICAL DATA: Pain following fall

EXAM:
CHEST  1 VIEW

[chest ap]
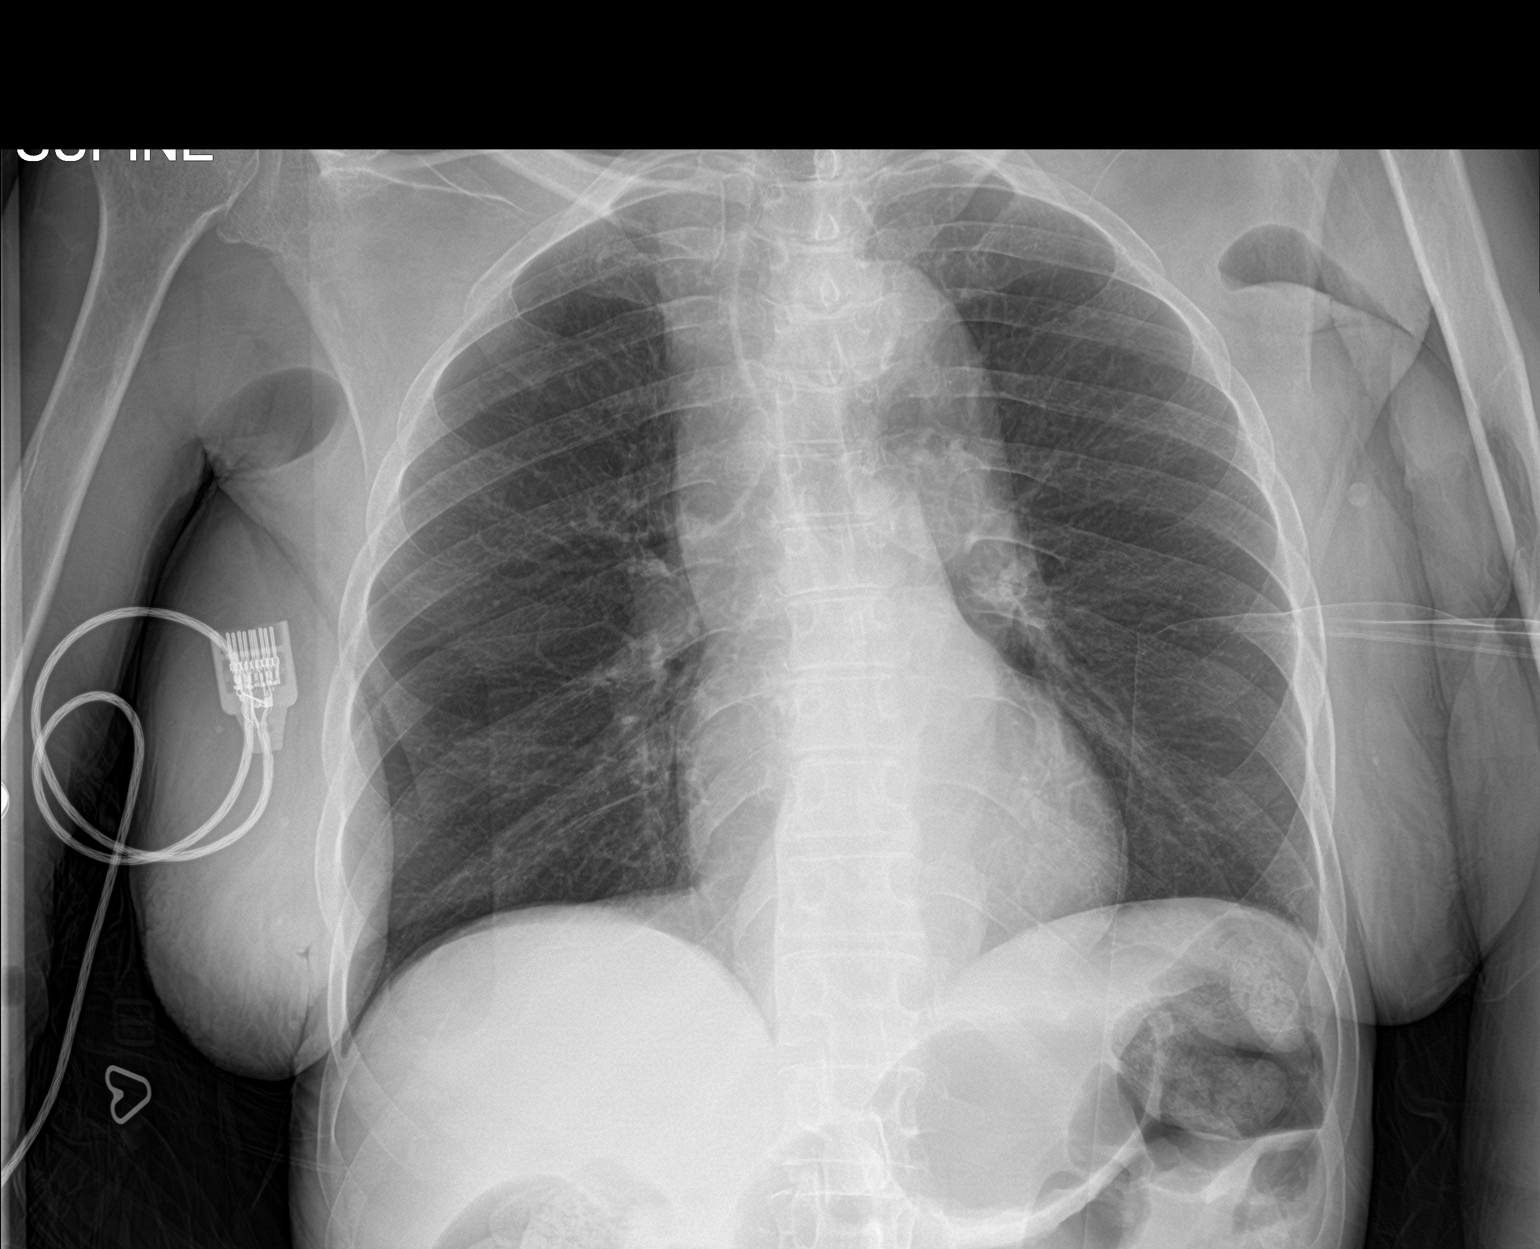

[1 of 1 positions shown; findings below may reference images not displayed]

FINDINGS: No edema or consolidation. Heart size and pulmonary vascularity are
normal. No adenopathy. There is aortic atherosclerosis. No
pneumothorax. No bone lesions.
IMPRESSION: Aortic atherosclerosis. No edema or consolidation. No pneumothorax.

Aortic Atherosclerosis ([id number]-REF.F).

## 2020-03-21 ENCOUNTER — Other Ambulatory Visit: Payer: Medicare Other

## 2020-03-21 ENCOUNTER — Other Ambulatory Visit: Payer: Self-pay

## 2020-03-21 DIAGNOSIS — Z515 Encounter for palliative care: Secondary | ICD-10-CM

## 2020-03-21 NOTE — Progress Notes (Signed)
COMMUNITY PALLIATIVE CARE SW NOTE  PATIENT NAME: Katelyn [name] DOB: 1950-04-15 MRN: [id number]  PRIMARY CARE PROVIDER: Aretta Nip, MD  RESPONSIBLE PARTY:  Acct ID - Guarantor Home Phone Work Phone Relationship Acct Type  [id number] Katelyn [name], [name] [phone]  Self P/F     [address] [place], [place] [address]   Due to the COVID-19, this visit was done via telephone from my office and it was initiated and consent by this patient and/or family.    PLAN OF CARE and INTERVENTIONS:             1. GOALS OF CARE/ ADVANCE CARE PLANNING:  Goal is for patient to remain in his home. Patient is a FULL CODE. 1.  SOCIAL/EMOTIONAL/SPIRITUAL ASSESSMENT/ INTERVENTIONS:  SW completed a telephonic visit with patient's daughter-Katelyn [name]. Katelyn [name] report that patient's trache and nebulizer supplies were out and needed some direction on how to get more supplies. She advised that she called [facility] and she was advised that they do not provide the supplies. SW consulted with the palliative care RN, who advised that she should follow-upwith patient's primary care physician. PCG also report that patient has had intermittent stomach pain.   Her appetite remains good as she is eating three meals with snacks throughout the day. She completes her own personal care. She ambulates with walker. Patient or daughter had no other concerns and remains open to ongoing support and visits by the palliative care team. SW scheduled a home visit with patient for 03/29/20@ 12 pm. 2. PATIENT/CAREGIVER EDUCATION/ COPING:  Patient have strong family support. She is able to express her needs. Patient is coping.  3. PERSONAL EMERGENCY PLAN:  911 can be activated for emergencies.. 4. COMMUNITY RESOURCES COORDINATION/ HEALTH CARE NAVIGATION:  None. 5. FINANCIAL/LEGAL CONCERNS/INTERVENTIONS:  None.     SOCIAL HX:  Social History   Tobacco Use   Smoking status: Former Smoker    Packs/day: 3.00    Years: 48.00     Pack years: 144.00    Types: Cigarettes    Start date: 86    Quit date: 05/29/2017    Years since quitting: 2.8   Smokeless tobacco: Never Used   Tobacco comment: 1 carton lasted 3 days  Substance Use Topics   Alcohol use: No    Alcohol/week: 0.0 standard drinks    CODE STATUS: Full Code ADVANCED DIRECTIVES: No MOST FORM COMPLETE: No HOSPICE EDUCATION PROVIDED: No  PPS: Patient is alert and oriented x 4. She ambulates with a walker. Patient is independent for personal care needs.  Duration of telephonic visit and documentation: 30 minutes.         [address] [place], [place]

## 2020-03-23 ENCOUNTER — Telehealth: Payer: Self-pay | Admitting: Pulmonary Disease

## 2020-03-23 DIAGNOSIS — J449 Chronic obstructive pulmonary disease, unspecified: Secondary | ICD-10-CM

## 2020-03-23 DIAGNOSIS — Z430 Encounter for attention to tracheostomy: Secondary | ICD-10-CM

## 2020-03-23 NOTE — Telephone Encounter (Signed)
Yes ok to order more supplies.  Thanks, Wille Glaser

## 2020-03-23 NOTE — Telephone Encounter (Signed)
Spoke with pt's daughter who states pt that needs new tubing for her nebulizer and more trach supplies. Pt's daughter states DME is [facility]. Dr. Erin Fulling may we have order to get more trach supplies and new nebulizer tuding?

## 2020-03-26 NOTE — Telephone Encounter (Signed)
Order placed for pt to receive more tubing for nebulizer as well as trach supplies from DME.  Called and spoke with pt's daughter [name] letting her know that this was done and she verbalized understanding. Nothing further needed.

## 2020-03-29 ENCOUNTER — Other Ambulatory Visit: Payer: Self-pay

## 2020-03-29 ENCOUNTER — Other Ambulatory Visit: Payer: Medicare Other

## 2020-03-29 ENCOUNTER — Other Ambulatory Visit: Payer: Medicare Other | Admitting: *Deleted

## 2020-03-29 VITALS — BP 185/95 | HR 66 | Temp 97.6°F | Resp 20 | Ht 63.0 in | Wt 139.7 lb

## 2020-03-29 DIAGNOSIS — Z515 Encounter for palliative care: Secondary | ICD-10-CM

## 2020-03-29 NOTE — Progress Notes (Signed)
COMMUNITY PALLIATIVE CARE SW NOTE  PATIENT NAME: Katelyn [name] DOB: Aug 16, 1950 MRN: [id number]  PRIMARY CARE PROVIDER: Aretta Nip, MD  RESPONSIBLE PARTY:  Acct ID - Guarantor Home Phone Work Phone Relationship Acct Type  [id number] [name], [name]  Self P/F     [address], [place], [place] [address]     PLAN OF CARE and INTERVENTIONS:             1. GOALS OF CARE/ ADVANCE CARE PLANNING:   1. Goal is for patient to remain in his home. Patient is a FULL CODE 2. SOCIAL/EMOTIONAL/SPIRITUAL ASSESSMENT/ INTERVENTIONS  SW and RN- Katelyn [name] completed a visit with patient at her home. Her daughter-Katelyn [name] and partner-Katelyn [name] was also present. Patient was sitting at the table. She was awake, alert, but appeared to be tired and her color was off. Her affect was flat and she was not in her normal cheerful mood. She reported that she is having pain in her right wrist and arm. Patient's blood pressure was elevated. Patient is also having intermittent swelling in her legs. The palliative RN also note that patient is dragging her right foot when she walk.  Her daughter had questions about medicaid. Patient's income is too high and she is having difficulty qualifying for a spend down account. However her daughter is working to try and qualify patient. SW also advised daughter/patient on getting a supplement to her medicare. Patient had a doctor's appointment yesterday that was cancelled due to her having a bill. The palliative care RN will follow-up with PCP and advised patient to take and record her blood pressures daily. The team consulted with her partner who expressed his concern about patient's compliance with certain medications and recommended interventions to ensure she continues progress. He was updated on patient's vitals and he agreed would assist patient with daily blood pressure checks. Patient remains open to support from the team and will continue to provide psychosocial  support.  1. PATIENT/CAREGIVER EDUCATION/ COPING:  Patient is alert and oriented x 4. She reported not feeling well and was not her usually bright affect and attitude.. 3.  4. PERSONAL EMERGENCY PLAN:  911 can be activated for emergencies. 5. COMMUNITY RESOURCES COORDINATION/ HEALTH CARE NAVIGATION:  None. 6. FINANCIAL/LEGAL CONCERNS/INTERVENTIONS: None.     SOCIAL HX:  Social History   Tobacco Use  . Smoking status: Former Smoker    Packs/day: 3.00    Years: 48.00    Pack years: 144.00    Types: Cigarettes    Start date: 35    Quit date: 05/29/2017    Years since quitting: 2.8  . Smokeless tobacco: Never Used  . Tobacco comment: 1 carton lasted 3 days  Substance Use Topics  . Alcohol use: No    Alcohol/week: 0.0 standard drinks    CODE STATUS: FULL CODE ADVANCED DIRECTIVES: No MOST FORM COMPLETE:  No HOSPICE EDUCATION PROVIDED: No  PPS: Patient is alert and oriented x 4, but was not feeling well today. She ambulates with a walker, but was dragging her foot. Patient ha some swelling. Patient is independent for personal care needs.  Duration of visit and documentation: 90 minutes.       [address] [place], [place]

## 2020-04-10 ENCOUNTER — Telehealth: Payer: Self-pay

## 2020-04-10 NOTE — Telephone Encounter (Signed)
(  3:50p) SW returned call to patient's daughter-[name]. She expressed concern about her mother not consistently taking her medications. She would like patient to go to the hospital to be evaluated, however patient does not want to go. SW advised that patient has a right to make decisions about her care. SW validated her concerns, but reminded her of patient's autonomy to choose. [name] is dealing with her own health issues, but is concern about patient. SW and Palliative care RN will stress importance of patient taking her medications.

## 2020-04-11 DIAGNOSIS — J449 Chronic obstructive pulmonary disease, unspecified: Secondary | ICD-10-CM | POA: Diagnosis not present

## 2020-04-17 NOTE — Progress Notes (Signed)
COMMUNITY PALLIATIVE CARE RN NOTE  PATIENT NAME: Katelyn [name] DOB: [birth year] MRN: [id number]  PRIMARY CARE PROVIDER: Aretta Nip, MD  RESPONSIBLE PARTY: Katelyn [name] (daughter) Acct ID - Guarantor Home Phone Work Phone Relationship Acct Type  [id number] [name], [name]  Self P/F     [place], [place] [address]   Covid-19 Pre-screening Negative  PLAN OF CARE and INTERVENTION:  1. ADVANCE CARE PLANNING/GOALS OF CARE: Goal is for patient to remain in her home and avoid hospitalizations. She is a Full code. 2. PATIENT/CAREGIVER EDUCATION: Symptom management, safe mobility/transfers, s/s of infection 3. DISEASE STATUS: Joint visit made with Palliative care MSW, Katelyn [name]. Met with patient, daughter Katelyn [name] and significant other, Katelyn [name]. Upon arrival, she is ambulating into the dining area using her rollator. I notice that she seems to be having more difficulty walking and her right leg is still and dragging. She reports that she just feels tired today. Her skin is more pale today and she does not appear to be feeling well. She had a recent visit to the ED per her PCPs recommendation for c/o right wrist pain that radiates up her right arm. Daughter states that this is the same side that was affected when patient had a previous stroke. X-ray was unremarkable to fractures. She was given a wrist brace, however mainly does not wear this. She continues with pain in her right wrist and arm. She says she is now unable to write. She states that she has not been wearing her oxygen during the day, but will sometimes use it during the night. She does experience some dyspnea with exertion. She has a trach collar. Katelyn [name] cleans the trach tube daily. Her pulmonologist sent a prescription to Katelyn [name] on 1/31 for nebulizer supplies [name] she can start using her Albuterol (she now has the medication in the home). She does have an intermittent cough. She does not like taking her  Guaifenesin [name] does not do [name] on a regular basis. She requires assistance with bathing and dressing. She has a shower chair and walk-in shower. Her intake is good. She has an elevated BP today of 185/95. During her ED visit on 02/27/20 her BP was 208/97. Recommended that family start keeping a log of her daily BPs. I contacted Dr. Dahlia [name] office to make aware of patient's BP and they will contact daughter with instructions. She informs [place] that she was only approved for Medicaid for the months of August, September, and October. Daughter states that she was told that patient had not met her deductible and her income does not meet eligibility criteria for ongoing benefits. Will continue to monitor.   HISTORY OF PRESENT ILLNESS: This is a 70 yo female with a history of essential hypertension, cancer of hypopharynx, COPD, dysphagia, CKD Stage 3 and difficult airway. Palliative care team continues to follow patient and visits monthly and PRN.       CODE STATUS: Full Code ADVANCED DIRECTIVES: Y MOST FORM: no PPS: 50%   PHYSICAL EXAM:   VITALS: Today's Vitals   03/29/20 1247  BP: (!) 185/95  Pulse: 66  Resp: 20  Temp: 97.6 F (36.4 C)  TempSrc: Temporal  SpO2: 95%  Weight: 139 lb 11.2 oz (63.4 kg)  Height: '5\' 3"'  (1.6 m)  PainSc: 3   PainLoc: Arm    LUNGS: clear to auscultation  CARDIAC: Cor RRR EXTREMITIES: Trace edema to bilateral lower extremities SKIN: Exposed skin is dry and intact  NEURO: Alert and oriented x  3, generalized weakness, ambulatory w/rollator walker   (Duration of visit and documentation 120 minutes)   Katelyn Eastern, RN BSN

## 2020-04-20 DIAGNOSIS — Z930 Tracheostomy status: Secondary | ICD-10-CM | POA: Diagnosis not present

## 2020-04-27 ENCOUNTER — Other Ambulatory Visit: Payer: Medicare Other

## 2020-04-27 ENCOUNTER — Other Ambulatory Visit: Payer: Self-pay

## 2020-04-27 ENCOUNTER — Other Ambulatory Visit: Payer: Medicare Other | Admitting: *Deleted

## 2020-04-27 VITALS — BP 180/94 | HR 84 | Temp 97.7°F | Resp 20

## 2020-04-27 DIAGNOSIS — Z515 Encounter for palliative care: Secondary | ICD-10-CM

## 2020-04-27 NOTE — Progress Notes (Signed)
COMMUNITY PALLIATIVE CARE SW NOTE  PATIENT NAME: Katelyn [name] DOB: [birth year] MRN: [id number]  PRIMARY CARE PROVIDER: Aretta Nip, MD  RESPONSIBLE PARTY:  Acct ID - Guarantor Home Phone Work Phone Relationship Acct Type  [id number] [name], [name]  Self P/F     [address] [place], [place] [address]     PLAN OF CARE and INTERVENTIONS:             1. GOALS OF CARE/ ADVANCE CARE PLANNING:  Goal is for patient to remain in her home. She remains a Full Code. 2. SOCIAL/EMOTIONAL/SPIRITUAL ASSESSMENT/ INTERVENTIONS:  SW and palliative care RN- Daryl Eastern completed a joint visit with patient at her home. Her partner-[name] was also present for the visit. Patient was sitting at the kitchen table. Patient was engaged, but her affect was flat and she seemed to be a depressed mood, which she later shared that she was feeling depressed. Patient continue to have arthritis and tendonitis in her right hand/arm where she has difficulty picking objects up. Her partner assists her with her showers and dressing. She ambulates wit her Rolator, but is dragging her right leg. She continues to have intermittent swelling in her legs. Her partner have been recording her blood pressures, which has been inconsistently elevated. Her blood pressure was slightly elevated today when taken by palliative RN. Her appetite remains good. She is not sleeping well at night due to unsettled thoughts. She is scheduled to received to have a hot to her hip soon and desires to get the Booster. Her ability to see her primary care physician has been on hold until she settles her account. Patient has family members that will be going to be beach, she has not seen one of her children in almost a month and she desires to get out of the house more. She reports she is depressed by this.The team talked with her partner about this and encouraged him to try and take her out more. Patient has a goal of being able to go to  church for Easter. The team will also discuss this with her daughters and address any concern they may have about taking her out. SW provided a resource for patient to purchase a recliner. The RN talked with patient about the importance of consistently taking her medication. The team also suggested physical therapy for patient, which she was open to-palliative care RN to follow-up. Patient's partner will continue to record her blood pressures. Patient's code status was reviewed and she desires to remain a FULL CODE. Patient and her partner remain open to ongoing palliative team visits. SW will continue to assess needs of patient and provide ongoing support. 3. PATIENT/CAREGIVER EDUCATION/ COPING:  Patient is alert and oriented x 4. She reported feeling depressed, emotional support and encouragement provided.  4. PERSONAL EMERGENCY PLAN:  911 can be accessed for emergencies. 5. COMMUNITY RESOURCES COORDINATION/ HEALTH CARE NAVIGATION:  None. Patient is receptive to physical therapy.  6. FINANCIAL/LEGAL CONCERNS/INTERVENTIONS:  None.      SOCIAL HX:  Social History   Tobacco Use  . Smoking status: Former Smoker    Packs/day: 3.00    Years: 48.00    Pack years: 144.00    Types: Cigarettes    Start date: 11    Quit date: 05/29/2017    Years since quitting: 2.9  . Smokeless tobacco: Never Used  . Tobacco comment: 1 carton lasted 3 days  Substance Use Topics  . Alcohol use: No    Alcohol/week:  0.0 standard drinks    CODE STATUS: FULL CODE ADVANCED DIRECTIVES: No MOST FORM COMPLETE:  No HOSPICE EDUCATION PROVIDED: No  PPS: Patient is alert and oriented x 4, but report some depression. She ambulates with a walker, but is dragging her foot. Patient's partner assist with personal care needs.  Duration of visit and documentation: 60 minutes.      [address] [place], [place]

## 2020-05-01 DIAGNOSIS — M25551 Pain in right hip: Secondary | ICD-10-CM | POA: Diagnosis not present

## 2020-05-09 DIAGNOSIS — J449 Chronic obstructive pulmonary disease, unspecified: Secondary | ICD-10-CM | POA: Diagnosis not present

## 2020-05-17 DIAGNOSIS — Z23 Encounter for immunization: Secondary | ICD-10-CM | POA: Diagnosis not present

## 2020-05-25 NOTE — Progress Notes (Signed)
COMMUNITY PALLIATIVE CARE RN NOTE  PATIENT NAME: Katelyn [name] DOB: Jul 06, 1950 MRN: [id number]  PRIMARY CARE PROVIDER: Aretta Nip, MD  RESPONSIBLE PARTY: Katelyn [name] (daughter) Acct ID - Guarantor Home Phone Work Phone Relationship Acct Type  [id number] Katelyn [name]  Self P/F     [place],  [address]   Covid-19 Pre-screening Negative  PLAN OF CARE and INTERVENTION:  1. ADVANCE CARE PLANNING/GOALS OF CARE: Goal is for patient to remain in her home. She is a Full code. 2. PATIENT/CAREGIVER EDUCATION: Symptom management, pain management, safe mobility/transfers, s/s of infection 3. DISEASE STATUS: Joint visit made with Palliative care MSW, [facility]. Met with patient and her significant other, [name], in their home. Upon arrival, patient is sitting up at her dining table awake and alert. She remains able to engage in conversation and make her needs known. Communication can be difficult at times due to her tracheostomy. She reports feelings of depression and wanting to get outside. She continues to report pain in her right wrist that radiates up her right arm. She was seen in the ED in the past for this pain and states that she was told it was tendinitis in her wrist. She does have a brace that she puts on at times. This is the side that was affected from a past stroke. She takes Tylenol arthritis as needed to help. She does become short of breath at times with exertion. She wears oxygen throughout the night. [name] continues to keep track of her blood pressures twice daily and they are mainly elevated. She is currently unable to visit with her PCP due to an outstanding balance. She requires assistance with bathing and dressing. She is having more difficulty ambulating and appears to drag her right leg. PT/OT was recommended and patient wants to consider this. Her significant other [name] states that she had it in the past but often cancelled so was  discharged. She does also have some pain in her right hip and states she wants her daughter to reach out to her Orthopedic doctor for a cortisone injection in that hip. She is also interested in receiving her Booster vaccine. Her appetite is good. She has trace edema noted to bilateral lower extremities, right >left. She is intermittently incontinent of bladder. Brought patient box of large Depends as requested that were donated. Will continue to monitor.   HISTORY OF PRESENT ILLNESS: This is a 70 yo female with a history of essential hypertension, cancer of hypopharynx, COPD, dysphagia, CKD Stage 3 and difficult airway. Palliative care teamcontinues to follow patient andvisitsmonthly and PRN.     CODE STATUS: Full code ADVANCED DIRECTIVES: Y MOST FORM: no PPS: 50%   PHYSICAL EXAM:   VITALS: Today's Vitals   04/27/20 1243  BP: (!) 180/94  Pulse: 84  Resp: 20  Temp: 97.7 F (36.5 C)  TempSrc: Temporal  SpO2: 98%  PainSc: 3   PainLoc: Wrist    LUNGS: clear to auscultation , occasional cough (does not like taking cough medication) CARDIAC: Cor RRR EXTREMITIES: Trace edema bilateral lower extremities R>L SKIN: Exposed skin is dry and intact  NEURO: Alert and oriented x 3, depressed mood, right sided weakness, ambulates w/rollator walker    (Duration of visit and documentation 60 minutes)   Daryl Eastern, RN BSN

## 2020-05-31 ENCOUNTER — Other Ambulatory Visit: Payer: Self-pay

## 2020-05-31 ENCOUNTER — Other Ambulatory Visit: Payer: Medicare Other | Admitting: *Deleted

## 2020-05-31 ENCOUNTER — Other Ambulatory Visit: Payer: Medicare Other

## 2020-05-31 VITALS — BP 182/102 | HR 74 | Temp 97.6°F | Resp 18

## 2020-05-31 DIAGNOSIS — Z515 Encounter for palliative care: Secondary | ICD-10-CM

## 2020-05-31 NOTE — Progress Notes (Signed)
COMMUNITY PALLIATIVE CARE SW NOTE  PATIENT NAME: Katelyn [name] DOB: 02/24/1951 MRN: [id number]  PRIMARY CARE PROVIDER: Aretta Nip, MD  RESPONSIBLE PARTY:  Acct ID - Guarantor Home Phone Work Phone Relationship Acct Type  [id number] LEONILA, [phone]  Self P/F     [address], [place], [place] [address]     PLAN OF CARE and INTERVENTIONS:             1. GOALS OF CARE/ ADVANCE CARE PLANNING:  Patient's goal is to remain in her home as long as possible. She remains a full code.  2. SOCIAL/EMOTIONAL/SPIRITUAL ASSESSMENT/ INTERVENTIONS:  SW and Palliative care nurse-M. Howard at patient's home. She was present with her partner-[name] and daughter-[name]. She was sitting at the kitchen table. Patient was smiling and greeted the team warmly. She was happy to show the team that she had purchased her recliner. She reported that she did get a shot in her hip that was helpful to her discomfort. She continues to drag her right leg when she ambulates. She continues to ambulate with her Rolator. Patient stated that she was also prescribed Tramadol (50mg ) that has been effective with her generalized pain and persistent pain to her right hand and arm. Patient also received her COVID booster. Patient was more engaged today, however due to her trac, it is difficult to understand her at times. She has a desire to go out shopping, however her family is apprehensive to do so, due to [place] concerns and patient ambulatory status. She continues to have difficulty picking up objects with her hands. Patient's partner continue to help keep her trache clean, assist with personal care, and household chores. Patient has a goal to be able to go to church for Mother's Day-her daughter is considering how to get her there. Per patient family request, patient was provided information for a physician that can visit her at home. Next visit is scheduled for 5/5@12pm . SW provided supportive presence, active  listening, encouragement and education.  3. PATIENT/CAREGIVER EDUCATION/ COPING: Patient seems to be coping well. She has support from her partner and family. 4. PERSONAL EMERGENCY PLAN:  911 can be activated for emergencies. 5. COMMUNITY RESOURCES COORDINATION/ HEALTH CARE NAVIGATION:  Patient is interested in physical therapy. She remains open to palliative care services. 6. FINANCIAL/LEGAL CONCERNS/INTERVENTIONS:  None.     SOCIAL HX:  Social History   Tobacco Use  . Smoking status: Former Smoker    Packs/day: 3.00    Years: 48.00    Pack years: 144.00    Types: Cigarettes    Start date: 42    Quit date: 05/29/2017    Years since quitting: 3.0  . Smokeless tobacco: Never Used  . Tobacco comment: 1 carton lasted 3 days  Substance Use Topics  . Alcohol use: No    Alcohol/week: 0.0 standard drinks    CODE STATUS: Full Code ADVANCED DIRECTIVES: No MOST FORM COMPLETE:  No HOSPICE EDUCATION PROVIDED: No  PPS: Patient is alert and oriented x 3.  She ambulates with a walker, but is dragging her foot. Patient's partner assist with personal care needs.  Duration of visit and documentation:60 minutes.      [address] [place], [place]

## 2020-06-02 DIAGNOSIS — J9610 Chronic respiratory failure, unspecified whether with hypoxia or hypercapnia: Secondary | ICD-10-CM | POA: Diagnosis not present

## 2020-06-02 DIAGNOSIS — Z0000 Encounter for general adult medical examination without abnormal findings: Secondary | ICD-10-CM | POA: Diagnosis not present

## 2020-06-02 DIAGNOSIS — G8190 Hemiplegia, unspecified affecting unspecified side: Secondary | ICD-10-CM | POA: Diagnosis not present

## 2020-06-02 DIAGNOSIS — K5900 Constipation, unspecified: Secondary | ICD-10-CM | POA: Diagnosis not present

## 2020-06-02 DIAGNOSIS — Z7901 Long term (current) use of anticoagulants: Secondary | ICD-10-CM | POA: Diagnosis not present

## 2020-06-04 DIAGNOSIS — G8190 Hemiplegia, unspecified affecting unspecified side: Secondary | ICD-10-CM | POA: Diagnosis not present

## 2020-06-04 DIAGNOSIS — J9610 Chronic respiratory failure, unspecified whether with hypoxia or hypercapnia: Secondary | ICD-10-CM | POA: Diagnosis not present

## 2020-06-09 DIAGNOSIS — J449 Chronic obstructive pulmonary disease, unspecified: Secondary | ICD-10-CM | POA: Diagnosis not present

## 2020-06-18 NOTE — Progress Notes (Signed)
COMMUNITY PALLIATIVE CARE RN NOTE  PATIENT NAME: Katelyn [name] DOB: 01-Dec-1950 MRN: [id number]  PRIMARY CARE PROVIDER: Aretta Nip, MD  RESPONSIBLE PARTY: [name] (daughter) Acct ID - Guarantor Home Phone Work Phone Relationship Acct Type  [id number] TIEARA, [phone]  Self P/F     [place], [place] [address]   Covid-19 Pre-screening Negative  PLAN OF CARE and INTERVENTION:  1. ADVANCE CARE PLANNING/GOALS OF CARE: Goal is for patient to remain in her home and avoid hospitalizations.  2. PATIENT/CAREGIVER EDUCATION: Symptom management, pain management, safe mobility/transfers, s/s of infection 3. DISEASE STATUS: Joint face-to-face visit made with palliative care MSW, [facility]. Met with patient, significant other [name], and daughter [name] in patient's home. Patient continues to report pain in her right wrist that radiates up her right arm. She does take Tylenol arthritis to help. It was recommended that she wear a wrist brace, but she rarely wears it stating that it doesn't help. She was recently seen by Ortho and receiving a Cortisone injection in her right hip. She says it is slightly better. She has a new prescription for Tramadol 50 mg every 6 hours as needed for pain. She continues to somewhat drag her right leg when ambulating with her rollator walker. She continues to require assistance with showers and dressing. She is able to toilet herself independently. Occasional urinary incontinence and wears Depends. She recently received her Covid-19 Booster vaccine. She has an occasional cough with some throat congestion noted. She still does not take her cough medication as prescribed. She wears oxygen at night. She has some trace edema noted to bilateral lower extremities, but is stable. She requested that her daughter purchase her a recliner for the living room so she could watch television there and something that is easier for her to stand up from vs  her couch. Her daughter bought one a few weeks ago, and patient has not yet sat in it. She is unsure why. Patient and family requesting information of physician's that visits patient's at home. Provided patient with this information as requested. Will continue to monitor.  HISTORY OF PRESENT ILLNESS: This is a 70 yo female with a history of essential hypertension, cancer of hypopharynx, COPD, dysphagia, CKD Stage 3 and difficult airway. Palliative care teamcontinues to follow patient andvisitsmonthly and PRN.    CODE STATUS: Full code ADVANCED DIRECTIVES: Y MOST FORM: no PPS: 50%   PHYSICAL EXAM:   VITALS: Today's Vitals   05/31/20 1231  BP: (!) 182/102  Pulse: 74  Resp: 18  Temp: 97.6 F (36.4 C)  TempSrc: Temporal  SpO2: 97%  PainSc: 2   PainLoc: Arm    LUNGS: clear to auscultation  CARDIAC: Cor RRR EXTREMITIES: Trace edema to bilateral lower extremities SKIN: Denies skin breakdown  NEURO: Alert and oriented x 3, right sided weakness, ambulatory w/walker   (Duration of visit and documentation 60 minutes)   Daryl Eastern, RN BSN

## 2020-06-20 ENCOUNTER — Ambulatory Visit: Payer: Medicare Other | Admitting: Pulmonary Disease

## 2020-06-28 ENCOUNTER — Other Ambulatory Visit: Payer: Medicare Other | Admitting: *Deleted

## 2020-06-28 ENCOUNTER — Other Ambulatory Visit: Payer: Self-pay

## 2020-06-28 VITALS — BP 191/96 | HR 67 | Temp 97.6°F | Resp 17

## 2020-06-28 DIAGNOSIS — Z515 Encounter for palliative care: Secondary | ICD-10-CM

## 2020-07-06 DIAGNOSIS — Z7901 Long term (current) use of anticoagulants: Secondary | ICD-10-CM | POA: Diagnosis not present

## 2020-07-06 DIAGNOSIS — K5900 Constipation, unspecified: Secondary | ICD-10-CM | POA: Diagnosis not present

## 2020-07-06 DIAGNOSIS — I10 Essential (primary) hypertension: Secondary | ICD-10-CM | POA: Diagnosis not present

## 2020-07-06 DIAGNOSIS — J9610 Chronic respiratory failure, unspecified whether with hypoxia or hypercapnia: Secondary | ICD-10-CM | POA: Diagnosis not present

## 2020-07-09 DIAGNOSIS — J449 Chronic obstructive pulmonary disease, unspecified: Secondary | ICD-10-CM | POA: Diagnosis not present

## 2020-07-17 NOTE — Progress Notes (Signed)
COMMUNITY PALLIATIVE CARE RN NOTE  PATIENT NAME: Katelyn [name] DOB: [birth year] MRN: [id number]  PRIMARY CARE PROVIDER: Aretta Nip, MD  RESPONSIBLE PARTY: Katelyn [name] (daughter) Acct ID - Guarantor Home Phone Work Phone Relationship Acct Type  [id number] Katelyn, [name]  Self P/F     [place], [place] [address]   Covid-19 Pre-screening Negative  PLAN OF CARE and INTERVENTION:  1. ADVANCE CARE PLANNING/GOALS OF CARE: Goal is for patient to remain in her home and avoid hospitalizations. She is a Full code. 2. PATIENT/CAREGIVER EDUCATION: Symptom management, pain management, safe mobility/transfers, s/s of infection 3. DISEASE STATUS: Face-to-face visit completed in patient's home. Daughter Katelyn [name] and significant other Katelyn [name] is present during visit. Patient is sitting up in a chair in the dining room. She is awake and alert. She is able to engage in appropriate conversation, but I have to read her lips due to her having a tracheostomy. She does experience some mild pain in her right wrist. She has a wrist brace that she will wear occasionally, but not daily as recommended. It has affected her ability to write legibly. She also has a mild pain in her right hip usually with movement. This has improved slightly since her Cortisone injection. She takes Tylenol arthritis to help and has Tramadol as needed available for more severe pain. She says that her daughter contacted Dr. Altamese Dilling office and one of his Nurse Practioner's made an in person visit over the weekend. No changes were made to her plan of care at this time. She says that her breathing has been ok. She has not been using her oxygen during the day or during the night for the past week. No coughing noted during visit. Her appetite has been good. She remains ambulatory using her walker, but does still drag her right leg some. Katelyn [name] assists her with bathing and dressing. She is able to toilet herself  independently. She has some occasional urinary incontinence and wears Depends. She is incontinent of her bowels and reports having regular bowel movements. Will continue to monitor.   HISTORY OF PRESENT ILLNESS:   This is a 70 yo female with a history of essential hypertension, cancer of hypopharynx, COPD, dysphagia, CKD Stage 3 and difficult airway. Palliative care teamcontinues to follow patient andvisitsmonthly and PRN.  CODE STATUS: Full Code  ADVANCED DIRECTIVES: Y MOST FORM: no PPS: 50%   PHYSICAL EXAM:   VITALS: Today's Vitals   06/28/20 1232  BP: (!) 191/96  Pulse: 67  Resp: 17  Temp: 97.6 F (36.4 C)  TempSrc: Temporal  SpO2: 97%  PainSc: 2   PainLoc: Wrist    LUNGS: clear to auscultation  CARDIAC: Cor RRR EXTREMITIES: Trace edema to bilateral lower extremities SKIN: No skin issues  NEURO: Alert and oriented x 3, right sided weakness, ambulatory w/rollator walker   (Duration of visit and documentation 60 minutes)   Katelyn Eastern, RN BSN

## 2020-07-26 ENCOUNTER — Other Ambulatory Visit: Payer: Self-pay

## 2020-07-26 ENCOUNTER — Other Ambulatory Visit: Payer: Medicare Other | Admitting: *Deleted

## 2020-07-26 VITALS — BP 184/90 | HR 77 | Temp 97.7°F | Resp 20

## 2020-07-26 DIAGNOSIS — Z515 Encounter for palliative care: Secondary | ICD-10-CM

## 2020-08-07 ENCOUNTER — Telehealth: Payer: Self-pay | Admitting: Pulmonary Disease

## 2020-08-07 NOTE — Telephone Encounter (Signed)
I called and spoke with patient daughter Katelyn [name] who stated that [employer] told her they are not filling trach supplies anymore and patient has gotten neb machine but not the tubing that is needed to hook the machine to her trach. Daughter has stated that [employer] keeps saying they will bring it and have not but Dr. Erin Fulling wanted her take the nebs every 6 hours and has not been able to do it. Daughter is awaiting a callback from [facility] and I have also [place] at [employer] to try and help. Will leave message open until we hear back from them.

## 2020-08-08 DIAGNOSIS — C139 Malignant neoplasm of hypopharynx, unspecified: Secondary | ICD-10-CM | POA: Diagnosis not present

## 2020-08-08 DIAGNOSIS — Z9002 Acquired absence of larynx: Secondary | ICD-10-CM | POA: Diagnosis not present

## 2020-08-08 DIAGNOSIS — M542 Cervicalgia: Secondary | ICD-10-CM | POA: Diagnosis not present

## 2020-08-08 NOTE — Progress Notes (Signed)
COMMUNITY PALLIATIVE CARE RN NOTE  PATIENT NAME: Katelyn [name] DOB: Jul 23, 1950 MRN: [id number]  PRIMARY CARE PROVIDER: Aretta Nip, MD  RESPONSIBLE PARTY: Katelyn [name] (daughter) Acct ID - Guarantor Home Phone Work Phone Relationship Acct Type  [id number] DAISIE, [id number] [name]  Self P/F     [address] [place], [place] [address]   Covid-19 Pre-screening Negative  PLAN OF CARE and INTERVENTION:  ADVANCE CARE PLANNING/GOALS OF CARE: Goal is for patient to remain in her home and avoid hospitalizations. PATIENT/CAREGIVER EDUCATION: Symptom management, safe mobility/transfers, s/s of infection DISEASE STATUS: Face-to-face visit completed in patient's home. Daughter Katelyn [name] and significant other Katelyn [name] present in the home also. Patient sitting up in a chair at her dining table. She is awake, alert, pleasant and engaging. She continues with a tracheostomy [name] you must read her lips to know what she is saying. She continues with mild pain in her right wrist which makes it difficult to write. She also has some pain in her right hip and her right leg drags some with ambulation with her walker. She takes Tylenol arthritis to help and has Tramadol available for severe pain. She is still able to get around ok. Katelyn [name] assists her with her bathing and dressing. She does have some intermittent urinary incontinence. I brought her 2 packs of Depends  donated from a community resource. She is back wearing her oxygen during the night. She had a coughing spell during my visit today after taking a sip of a Sprite. Her lungs are clear and no shortness of breath noted. Her blood pressure is elevated at 184/90, but she had just taken her medication only about 20 minutes ago. Katelyn [name] is going to start keeping a log of her blood pressures again to assess trends. She denies any dizziness and reports feeling well today. Her appetite remains good. Her bowel movements are regular. She had a recent visit from Dr.  Altamese Dilling NP. No changes made to her medications or plan of care. Will continue to monitor.   HISTORY OF PRESENT ILLNESS:  This is a 70 yo female with a history of essential hypertension, cancer of hypopharynx, COPD, dysphagia, CKD Stage 3 and difficult airway. Palliative care team continues to follow patient and visits monthly and PRN.     CODE STATUS: Full Code ADVANCED DIRECTIVES: Y MOST FORM: no PPS: 50%   PHYSICAL EXAM:   VITALS: Today's Vitals   07/26/20 1248  BP: (!) 184/90  Pulse: 77  Resp: 20  Temp: 97.7 F (36.5 C)  TempSrc: Temporal  SpO2: 98%  PainSc: 2   PainLoc: Hip    LUNGS: clear to auscultation  CARDIAC: Cor RRR EXTREMITIES: No edema SKIN: Skin color, texture, turgor normal. No rashes or lesions  NEURO:  Alert and oriented x 4, pleasant mood, generalized weakness, ambulatory w/rollator walker   (Duration of visit and documentation 75 minutes)   Daryl Eastern, RN BSN

## 2020-08-09 ENCOUNTER — Other Ambulatory Visit: Payer: Self-pay | Admitting: Family Medicine

## 2020-08-09 DIAGNOSIS — C139 Malignant neoplasm of hypopharynx, unspecified: Secondary | ICD-10-CM

## 2020-08-09 DIAGNOSIS — J449 Chronic obstructive pulmonary disease, unspecified: Secondary | ICD-10-CM | POA: Diagnosis not present

## 2020-08-09 DIAGNOSIS — Z930 Tracheostomy status: Secondary | ICD-10-CM | POA: Diagnosis not present

## 2020-08-09 DIAGNOSIS — M542 Cervicalgia: Secondary | ICD-10-CM

## 2020-08-09 NOTE — Telephone Encounter (Signed)
I checked heather's basket for community response back from [employer] and there was nothing, so called Melissa and LMTCB.

## 2020-08-09 NOTE — Telephone Encounter (Signed)
Melissa from [facility] called the office back In regards to patient I read her the message from Amy and she said she was going to check into getting her the supplies for her nebulizer that she needs to hook up to her trach and call the office back. Will route back to triage

## 2020-08-10 ENCOUNTER — Ambulatory Visit
Admission: RE | Admit: 2020-08-10 | Discharge: 2020-08-10 | Disposition: A | Discharge status: Home / Self Care | Payer: Medicare Other | Source: Ambulatory Visit | Attending: Family Medicine | Admitting: Family Medicine

## 2020-08-10 ENCOUNTER — Other Ambulatory Visit: Payer: Self-pay

## 2020-08-10 DIAGNOSIS — M542 Cervicalgia: Secondary | ICD-10-CM | POA: Diagnosis not present

## 2020-08-10 DIAGNOSIS — C139 Malignant neoplasm of hypopharynx, unspecified: Secondary | ICD-10-CM

## 2020-08-10 DIAGNOSIS — J392 Other diseases of pharynx: Secondary | ICD-10-CM | POA: Diagnosis not present

## 2020-08-10 MED ORDER — IOPAMIDOL (ISOVUE-300) INJECTION 61%
75.0000 mL | Freq: Once | INTRAVENOUS | Status: AC | PRN
  Administered 2020-08-10: 75 mL via INTRAVENOUS

## 2020-08-16 NOTE — Telephone Encounter (Signed)
Lmtcb for Katelyn [name] with [employer]. Need to see whats going on with neb tubing and trach supplies. Pt needs asap.

## 2020-08-23 NOTE — Telephone Encounter (Signed)
Spoke with Melissa with [employer]. Pt has already been shipped neb supplies for trach. Lenna Sciara has been in contact with the pt's daughter and has her direct number to call Melissa if anything further is needed. Called and left detailed message for pt's EC, [name]. Advised to call back if supplies are still needed but didn't need to call back if all is well. Will keep the encounter open for a few days to see if pt/EC calls back.

## 2020-08-29 ENCOUNTER — Other Ambulatory Visit: Payer: Self-pay

## 2020-08-29 ENCOUNTER — Other Ambulatory Visit: Payer: Medicare Other | Admitting: *Deleted

## 2020-08-29 VITALS — BP 192/127 | HR 80 | Temp 98.0°F | Resp 19

## 2020-08-29 DIAGNOSIS — Z515 Encounter for palliative care: Secondary | ICD-10-CM

## 2020-09-05 DIAGNOSIS — Z8521 Personal history of malignant neoplasm of larynx: Secondary | ICD-10-CM | POA: Diagnosis not present

## 2020-09-05 DIAGNOSIS — C329 Malignant neoplasm of larynx, unspecified: Secondary | ICD-10-CM | POA: Diagnosis not present

## 2020-09-08 DIAGNOSIS — J449 Chronic obstructive pulmonary disease, unspecified: Secondary | ICD-10-CM | POA: Diagnosis not present

## 2020-09-13 DIAGNOSIS — E785 Hyperlipidemia, unspecified: Secondary | ICD-10-CM | POA: Diagnosis not present

## 2020-09-13 DIAGNOSIS — J449 Chronic obstructive pulmonary disease, unspecified: Secondary | ICD-10-CM | POA: Diagnosis not present

## 2020-09-13 DIAGNOSIS — J955 Postprocedural subglottic stenosis: Secondary | ICD-10-CM | POA: Diagnosis not present

## 2020-09-13 DIAGNOSIS — Z87891 Personal history of nicotine dependence: Secondary | ICD-10-CM | POA: Diagnosis not present

## 2020-09-13 DIAGNOSIS — Z79899 Other long term (current) drug therapy: Secondary | ICD-10-CM | POA: Diagnosis not present

## 2020-09-13 DIAGNOSIS — I110 Hypertensive heart disease with heart failure: Secondary | ICD-10-CM | POA: Diagnosis not present

## 2020-09-13 DIAGNOSIS — Z7901 Long term (current) use of anticoagulants: Secondary | ICD-10-CM | POA: Diagnosis not present

## 2020-09-13 DIAGNOSIS — Z8521 Personal history of malignant neoplasm of larynx: Secondary | ICD-10-CM | POA: Diagnosis not present

## 2020-09-13 DIAGNOSIS — Z8673 Personal history of transient ischemic attack (TIA), and cerebral infarction without residual deficits: Secondary | ICD-10-CM | POA: Diagnosis not present

## 2020-09-13 DIAGNOSIS — F419 Anxiety disorder, unspecified: Secondary | ICD-10-CM | POA: Diagnosis not present

## 2020-09-13 DIAGNOSIS — Z86718 Personal history of other venous thrombosis and embolism: Secondary | ICD-10-CM | POA: Diagnosis not present

## 2020-09-13 DIAGNOSIS — I509 Heart failure, unspecified: Secondary | ICD-10-CM | POA: Diagnosis not present

## 2020-09-13 DIAGNOSIS — Z85818 Personal history of malignant neoplasm of other sites of lip, oral cavity, and pharynx: Secondary | ICD-10-CM | POA: Diagnosis not present

## 2020-09-18 DIAGNOSIS — I509 Heart failure, unspecified: Secondary | ICD-10-CM | POA: Diagnosis not present

## 2020-09-18 DIAGNOSIS — Z7901 Long term (current) use of anticoagulants: Secondary | ICD-10-CM | POA: Diagnosis not present

## 2020-09-18 DIAGNOSIS — Z86718 Personal history of other venous thrombosis and embolism: Secondary | ICD-10-CM | POA: Diagnosis not present

## 2020-09-18 DIAGNOSIS — J955 Postprocedural subglottic stenosis: Secondary | ICD-10-CM | POA: Diagnosis not present

## 2020-09-18 DIAGNOSIS — M542 Cervicalgia: Secondary | ICD-10-CM | POA: Diagnosis not present

## 2020-09-18 DIAGNOSIS — Z8673 Personal history of transient ischemic attack (TIA), and cerebral infarction without residual deficits: Secondary | ICD-10-CM | POA: Diagnosis not present

## 2020-09-18 DIAGNOSIS — Z85818 Personal history of malignant neoplasm of other sites of lip, oral cavity, and pharynx: Secondary | ICD-10-CM | POA: Diagnosis not present

## 2020-09-18 DIAGNOSIS — J387 Other diseases of larynx: Secondary | ICD-10-CM | POA: Diagnosis not present

## 2020-09-18 DIAGNOSIS — I110 Hypertensive heart disease with heart failure: Secondary | ICD-10-CM | POA: Diagnosis not present

## 2020-09-18 DIAGNOSIS — E785 Hyperlipidemia, unspecified: Secondary | ICD-10-CM | POA: Diagnosis not present

## 2020-09-18 DIAGNOSIS — F419 Anxiety disorder, unspecified: Secondary | ICD-10-CM | POA: Diagnosis not present

## 2020-09-18 DIAGNOSIS — J392 Other diseases of pharynx: Secondary | ICD-10-CM | POA: Diagnosis not present

## 2020-09-18 DIAGNOSIS — Z87891 Personal history of nicotine dependence: Secondary | ICD-10-CM | POA: Diagnosis not present

## 2020-09-18 DIAGNOSIS — Z8521 Personal history of malignant neoplasm of larynx: Secondary | ICD-10-CM | POA: Diagnosis not present

## 2020-09-18 DIAGNOSIS — Z79899 Other long term (current) drug therapy: Secondary | ICD-10-CM | POA: Diagnosis not present

## 2020-09-18 DIAGNOSIS — J449 Chronic obstructive pulmonary disease, unspecified: Secondary | ICD-10-CM | POA: Diagnosis not present

## 2020-09-24 NOTE — Progress Notes (Signed)
COMMUNITY PALLIATIVE CARE RN NOTE  PATIENT NAME: Katelyn [name] DOB: 1950/07/18 MRN: [id number]  PRIMARY CARE PROVIDER: Aretta Nip, MD  RESPONSIBLE PARTY: Katelyn [name] (daughter) Acct ID - Guarantor Home Phone Work Phone Relationship Acct Type  [id number] Katelyn [name], Katelyn [name]  Self P/F     [address], [place], [place] [address]   Covid-19 Pre-screening Negative  PLAN OF CARE and INTERVENTION:  ADVANCE CARE PLANNING/GOALS OF CARE: Goal is for patient to remain in her home and find out the cause of her neck pain.  PATIENT/CAREGIVER EDUCATION: Symptom management, pain management, safe mobility/transfers DISEASE STATUS: Face-to-face visit completed with patient, her significant other Katelyn [name] and daughter Katelyn [name]. Patient has been experiencing pain on the left side of her neck. It has been hard and painful to touch. She made an appointment with Dr. [name] office following these symptoms and saw the PA who ordered a scan. [name] says the scan showed hardening tissues in the neck. She denies pain in her neck today, but this has been an issue. She has a new order for Hydrocodone, but patient had not taken this as of yet. She has an appointment scheduled on September 05, 2020 to see the surgeon who performed her tracheostomy procedure for further assessment. Also, patient does not appear to be feeling well today. We met in her bedroom, as she was having difficulties walking with her walker. Her right leg continues to drag and she has problems getting it to move. I had to assist her with getting her Depends and pants on. She also has a lot of nasal drainage. It is thick and white in appearance. This has been going on for a few weeks. Her blood pressure is very elevated at 192/127 today. Patient has not yet taken any of her medications. Explained the importance of making sure she takes her medications consistently each morning to help prevent such elevated blood pressures, especially for a  long period of time. Her appetite has decreased also. She is back to using her oxygen throughout the night. She voiced feelings of depression and is worried as to what the surgeon may say at her appointment. Provided active listening and support. Will follow-up with patient after her appointment next week.   HISTORY OF PRESENT ILLNESS:  This is a 70 yo female with a history of essential hypertension, cancer of hypopharynx, COPD, dysphagia, CKD Stage 3 and difficult airway. Palliative care team continues to follow patient for goals of care and complex decision making.   CODE STATUS: Full code ADVANCED DIRECTIVES: Y MOST FORM: no PPS: weak 50%   PHYSICAL EXAM:   VITALS: Today's Vitals   08/29/20 1236  BP: (!) 192/127  Pulse: 80  Resp: 19  Temp: 98 F (36.7 C)  TempSrc: Temporal  SpO2: 97%  PainSc: 0-No pain    LUNGS: decreased breath sounds CARDIAC: Cor RRR EXTREMITIES: Trace edema in bilateral lower extremities SKIN:  Skin appears pale today, no open areas   NEURO:  Alert and oriented x 3, depressed mood, increased generalized weakness, ambulatory w/walker    (Duration of visit and documentation 75 minutes)   Daryl Eastern, RN BSN

## 2020-10-03 ENCOUNTER — Other Ambulatory Visit: Payer: Medicare Other | Admitting: *Deleted

## 2020-10-03 ENCOUNTER — Other Ambulatory Visit: Payer: Self-pay

## 2020-10-03 VITALS — BP 172/100 | HR 71 | Temp 97.0°F | Resp 18

## 2020-10-03 DIAGNOSIS — Z515 Encounter for palliative care: Secondary | ICD-10-CM

## 2020-10-08 NOTE — Progress Notes (Signed)
[facility] PALLIATIVE CARE RN NOTE  PATIENT NAME: Katelyn [name] DOB: 04-Nov-1950 MRN: [id number]  PRIMARY CARE PROVIDER: Aretta Nip, MD  RESPONSIBLE PARTY: Katelyn [name] (daughter) Acct ID - Guarantor Home Phone Work Phone Relationship Acct Type  [id number] ALFARETTA, [phone]  Self P/F     [address] [place], [place] [address]   Covid-19 Pre-screening Negative  PLAN OF CARE and INTERVENTION:  ADVANCE CARE PLANNING/GOALS OF CARE: Goal is for patient to remain in her home. She is a Full code. PATIENT/CAREGIVER EDUCATION: Symptom management, safe mobility DISEASE STATUS: Palliative care visit completed with patient and daughter, Katelyn [name], in patient's home. Upon arrival, patient is ambulating using her rollator walker from the bathroom. She continues to have some difficulty with ambulation and dragging of her right leg. However, she appears stronger today than noted on my visit last month. She denies any pain or discomfort today, but still does have mild aching pain to her right wrist and arm. She does not wear her wrist brace. She takes Tylenol when needed and is effective. Respirations are even, regular and unlabored. Denies coughing and none heard during visit. She is wearing her oxygen at night via trach collar. She recently had an appointment with Dr. Vicie Mutters, surgeon at [facility] due to a suspicious area noted on her CT scan in her throat. A biopsy was taken and it is negative for cancer [name] is felt it is more likely thickening from scar tissue. She has a follow up appointment with him on 10/10/20. She is no longer experiencing any neck pain, which warranted the scan in the first place. Her appetite remains good. She says she is drinking fluids. Trace edema to bilateral lower extremities are stable. Brought patient some packs of Large Depends that were donated by a community resource. Will continue to monitor.   HISTORY OF PRESENT ILLNESS:   This is a 70 yo  female with a history of essential hypertension, cancer of hypopharynx, COPD, dysphagia, CKD Stage 3 and difficult airway. Palliative care team continues to follow patient for goals of care and complex decision making.   CODE STATUS: Full code  ADVANCED DIRECTIVES: Y MOST FORM: no PPS: 50%   PHYSICAL EXAM:   VITALS: Today's Vitals   10/03/20 1331  BP: (!) 172/100  Pulse: 71  Resp: 18  Temp: (!) 97 F (36.1 C)  TempSrc: Temporal  SpO2: 94%  PainSc: 0-No pain    LUNGS: clear to auscultation  CARDIAC: Cor RRR EXTREMITIES: Trace edema to bilateral lower extremities SKIN: Skin color, texture, turgor normal. No rashes or lesions  NEURO:  Alert and oriented x 4, generalized weakness, ambulatory w/walker   (Duration of visit and documentation 60 minutes)   Daryl Eastern, RN BSN

## 2020-10-09 DIAGNOSIS — J449 Chronic obstructive pulmonary disease, unspecified: Secondary | ICD-10-CM | POA: Diagnosis not present

## 2020-10-24 DIAGNOSIS — Z08 Encounter for follow-up examination after completed treatment for malignant neoplasm: Secondary | ICD-10-CM | POA: Diagnosis not present

## 2020-10-24 DIAGNOSIS — M7918 Myalgia, other site: Secondary | ICD-10-CM | POA: Diagnosis not present

## 2020-10-25 DIAGNOSIS — Z930 Tracheostomy status: Secondary | ICD-10-CM | POA: Diagnosis not present

## 2020-10-31 ENCOUNTER — Other Ambulatory Visit: Payer: Medicare Other | Admitting: *Deleted

## 2020-10-31 ENCOUNTER — Other Ambulatory Visit: Payer: Medicare Other

## 2020-10-31 ENCOUNTER — Other Ambulatory Visit: Payer: Self-pay

## 2020-10-31 VITALS — BP 190/100 | HR 68 | Temp 97.6°F | Resp 20

## 2020-10-31 DIAGNOSIS — Z515 Encounter for palliative care: Secondary | ICD-10-CM

## 2020-10-31 NOTE — Progress Notes (Signed)
COMMUNITY PALLIATIVE CARE SW NOTE  PATIENT NAME: Katelyn [name] DOB: 1950-10-11 MRN: [id number]  PRIMARY CARE PROVIDER: Aretta Nip, MD  RESPONSIBLE PARTY:  Acct ID - Guarantor Home Phone Work Phone Relationship Acct Type  [id number] VERENICE, [phone]  Self P/F     [address], [place], [place] [address]     PLAN OF CARE and INTERVENTIONS:             GOALS OF CARE/ ADVANCE CARE PLANNING:  Patient's goal is to remain in her home. Patient is a full code.  SOCIAL/EMOTIONAL/SPIRITUAL ASSESSMENT/ INTERVENTIONS:  SW and RN-M. Nadara Mustard completed a face-to-face visit with patient at her home. She was present with her daughter-[name] and significant other-[name]. Patient was sitting at the dinning room table. She greeted the team warmly. Patient immediately advised SW that she wanted SW to made a referral to MOM meals for her. Patient report that she continues to feel stiffness in her neck. She also has pain to the left side of her neck daily. Patient reports that she does take pain medication for it, but does not like to take it because it makes her sleepy. Patient report that she does not have any issues swallowing, despite the pain to her left side of her neck. Patient coughs daily and it is productive. She only wears o2, 2L at night and she is not consistent with her nebulizer treatments. She also report pain to her right arm. Her daughter fill her pill box. SW and RN observed that patient has some swelling in her ankles. Patient is not consistent with keeping her legs elevated. RN reinforced interventions to decrease coughing and edema. SW provided supportive presence, active listening, encouragement and education. Patient and PCG remain open ongoing palliative care visits/support.  PATIENT/CAREGIVER EDUCATION/ COPING:  Patient appears to be coping well. PERSONAL EMERGENCY PLAN:  911 can be activated for emergencies. COMMUNITY RESOURCES COORDINATION/ HEALTH CARE NAVIGATION:  Patient  has an order for physical therapy.  FINANCIAL/LEGAL CONCERNS/INTERVENTIONS:  None.     SOCIAL HX:  Social History   Tobacco Use   Smoking status: Former    Packs/day: 3.00    Years: 48.00    Pack years: 144.00    Types: Cigarettes    Start date: 43    Quit date: 05/29/2017    Years since quitting: 3.4   Smokeless tobacco: Never   Tobacco comments:    1 carton lasted 3 days  Substance Use Topics   Alcohol use: No    Alcohol/week: 0.0 standard drinks    CODE STATUS: Full code ADVANCED DIRECTIVES: No MOST FORM COMPLETE: No HOSPICE EDUCATION PROVIDED: NO  PPS: Patient is alert and oriented x 3.  She reports pain to her neck and right arm. Patient's partner assist with personal care needs.   Duration of visit and documentation: 60 minutes.    [address] [place], [place]

## 2020-11-01 ENCOUNTER — Telehealth: Payer: Self-pay | Admitting: *Deleted

## 2020-11-01 NOTE — Telephone Encounter (Signed)
3:37p Called and spoke with Dr. Mariea Clonts regarding patient's ongoing issues with elevated blood pressures. On my visit yesterday her blood pressure was 194/110 with automatic cuff and 190/100 manually. Pulse 68. She also has some trace edema noted in bilateral lower extremities. Skin appears shiny. Patient keeps her legs in a dependent position most of the day. Dr. Mariea Clonts prescribed Chlorthalidone 25 mg po daily.  I called this medication in to [facility] at [facility]. I called and spoke with patient's daughter, Katelyn [name], to make her aware of this and provide instructions. She is appreciative.

## 2020-11-01 NOTE — Progress Notes (Signed)
[facility] PALLIATIVE CARE RN NOTE  PATIENT NAME: Katelyn [name] DOB: 09-20-1950 MRN: [id number]  PRIMARY CARE PROVIDER: Aretta Nip, MD  RESPONSIBLE PARTY: Katelyn [name] (daughter) Acct ID - Guarantor Home Phone Work Phone Relationship Acct Type  [id number] AMNEET, [phone]  Self P/F     [address] [place], [place] [address]   Covid-19 Pre-screening Negative  PLAN OF CARE and INTERVENTION:  ADVANCE CARE PLANNING/GOALS OF CARE: Goal is for patient to remain in her home. She is a Full code. PATIENT/CAREGIVER EDUCATION: Symptom management, pain management, safe mobility/transfers, s/s of infection, blood pressure and edema management DISEASE STATUS: Joint visit made with LCSW, M. Lonon. Met with patient and daughter Katelyn [name] in patient's home. She is sitting up in her dining room chair awake and alert. She remains alert and oriented x 4 and able to engage in appropriate conversation. She does have a trach [name] have to read her lips to understand what is being said. She continues with chronic pain in her right hip, right hand and arm. She takes Tylenol to help. She does have Tramadol and Norco available if needed, but she says it makes her too sleepy. She denies shortness of breath. She wears her oxygen at night at 2L/min via [place]. Slight expiratory wheezes noted bilaterally. I recommended that patient start using her nebulizer treatments. She has a non-productive cough. Encouraged use of her cough syrup. She had a recent follow-up appointment with Dr. Rolena Infante, ENT. Patient has been c/o pain and stiffness in her neck, but denies pain with  swallowing. MD feels this is due to build up of scar tissue after her surgery for her throat cancer. He is recommended PT for her neck. Spoke to our [facility] clinical nurse navigator who is going to assist daughter in finding a home health company for therapy. Daughter to provide me with referral letter from Dr. Rolena Infante. Information  also provided for in-home flu vaccinations as requested by daughter. She continues to have issues with elevated blood pressures. Her blood pressure with automatic cuff was 194/110 and manually 190/100. She denies any dizziness or headaches. She has some edema noted to bilateral lower extremities. It is not pitting at this time, but very shiny in appearance. She states she did take all of her medications this morning. Advised will reach out to our palliative care MD to discuss patient's elevated blood pressures. Daughter is Patent attorney.   HISTORY OF PRESENT ILLNESS: This is a 70 yo female with a history of essential hypertension, cancer of hypopharynx, COPD, dysphagia, CKD Stage 3 and difficult airway. Palliative care team continues to follow patient for goals of care and complex decision making.    CODE STATUS: Full code ADVANCED DIRECTIVES: Y MOST FORM: no PPS: 50%   PHYSICAL EXAM:   VITALS: Today's Vitals   10/31/20 1313  BP: (!) 190/100  Pulse: 68  Resp: 20  Temp: 97.6 F (36.4 C)  TempSrc: Temporal  SpO2: 97%  PainSc: 2   PainLoc: Neck    LUNGS: expiratory wheezes bilaterally CARDIAC: Cor RRR EXTREMITIES: Trace edema SKIN:  Exposed skin is dry and intact; Hands and legs are cool to touch   NEURO:  Alert and oriented x 4, generalized weakness, ambulatory w/rollator walker   (Duration of visit and documentation 60 minutes)    Daryl Eastern, RN BSN

## 2020-11-06 DIAGNOSIS — I69393 Ataxia following cerebral infarction: Secondary | ICD-10-CM | POA: Diagnosis not present

## 2020-11-06 DIAGNOSIS — E440 Moderate protein-calorie malnutrition: Secondary | ICD-10-CM | POA: Diagnosis not present

## 2020-11-06 DIAGNOSIS — I130 Hypertensive heart and chronic kidney disease with heart failure and stage 1 through stage 4 chronic kidney disease, or unspecified chronic kidney disease: Secondary | ICD-10-CM | POA: Diagnosis not present

## 2020-11-06 DIAGNOSIS — Z86711 Personal history of pulmonary embolism: Secondary | ICD-10-CM | POA: Diagnosis not present

## 2020-11-06 DIAGNOSIS — F419 Anxiety disorder, unspecified: Secondary | ICD-10-CM | POA: Diagnosis not present

## 2020-11-06 DIAGNOSIS — J9622 Acute and chronic respiratory failure with hypercapnia: Secondary | ICD-10-CM | POA: Diagnosis not present

## 2020-11-06 DIAGNOSIS — Z9181 History of falling: Secondary | ICD-10-CM | POA: Diagnosis not present

## 2020-11-06 DIAGNOSIS — I82509 Chronic embolism and thrombosis of unspecified deep veins of unspecified lower extremity: Secondary | ICD-10-CM | POA: Diagnosis not present

## 2020-11-06 DIAGNOSIS — Z7901 Long term (current) use of anticoagulants: Secondary | ICD-10-CM | POA: Diagnosis not present

## 2020-11-06 DIAGNOSIS — R490 Dysphonia: Secondary | ICD-10-CM | POA: Diagnosis not present

## 2020-11-06 DIAGNOSIS — R1310 Dysphagia, unspecified: Secondary | ICD-10-CM | POA: Diagnosis not present

## 2020-11-06 DIAGNOSIS — M542 Cervicalgia: Secondary | ICD-10-CM | POA: Diagnosis not present

## 2020-11-06 DIAGNOSIS — J9621 Acute and chronic respiratory failure with hypoxia: Secondary | ICD-10-CM | POA: Diagnosis not present

## 2020-11-06 DIAGNOSIS — G609 Hereditary and idiopathic neuropathy, unspecified: Secondary | ICD-10-CM | POA: Diagnosis not present

## 2020-11-06 DIAGNOSIS — C321 Malignant neoplasm of supraglottis: Secondary | ICD-10-CM | POA: Diagnosis not present

## 2020-11-06 DIAGNOSIS — N183 Chronic kidney disease, stage 3 unspecified: Secondary | ICD-10-CM | POA: Diagnosis not present

## 2020-11-06 DIAGNOSIS — I509 Heart failure, unspecified: Secondary | ICD-10-CM | POA: Diagnosis not present

## 2020-11-06 DIAGNOSIS — C139 Malignant neoplasm of hypopharynx, unspecified: Secondary | ICD-10-CM | POA: Diagnosis not present

## 2020-11-12 DIAGNOSIS — R1310 Dysphagia, unspecified: Secondary | ICD-10-CM | POA: Diagnosis not present

## 2020-11-12 DIAGNOSIS — C321 Malignant neoplasm of supraglottis: Secondary | ICD-10-CM | POA: Diagnosis not present

## 2020-11-12 DIAGNOSIS — F419 Anxiety disorder, unspecified: Secondary | ICD-10-CM | POA: Diagnosis not present

## 2020-11-12 DIAGNOSIS — R490 Dysphonia: Secondary | ICD-10-CM | POA: Diagnosis not present

## 2020-11-12 DIAGNOSIS — I130 Hypertensive heart and chronic kidney disease with heart failure and stage 1 through stage 4 chronic kidney disease, or unspecified chronic kidney disease: Secondary | ICD-10-CM | POA: Diagnosis not present

## 2020-11-12 DIAGNOSIS — J9622 Acute and chronic respiratory failure with hypercapnia: Secondary | ICD-10-CM | POA: Diagnosis not present

## 2020-11-12 DIAGNOSIS — I69393 Ataxia following cerebral infarction: Secondary | ICD-10-CM | POA: Diagnosis not present

## 2020-11-12 DIAGNOSIS — C139 Malignant neoplasm of hypopharynx, unspecified: Secondary | ICD-10-CM | POA: Diagnosis not present

## 2020-11-12 DIAGNOSIS — I509 Heart failure, unspecified: Secondary | ICD-10-CM | POA: Diagnosis not present

## 2020-11-12 DIAGNOSIS — G609 Hereditary and idiopathic neuropathy, unspecified: Secondary | ICD-10-CM | POA: Diagnosis not present

## 2020-11-12 DIAGNOSIS — E440 Moderate protein-calorie malnutrition: Secondary | ICD-10-CM | POA: Diagnosis not present

## 2020-11-12 DIAGNOSIS — I82509 Chronic embolism and thrombosis of unspecified deep veins of unspecified lower extremity: Secondary | ICD-10-CM | POA: Diagnosis not present

## 2020-11-12 DIAGNOSIS — Z7901 Long term (current) use of anticoagulants: Secondary | ICD-10-CM | POA: Diagnosis not present

## 2020-11-12 DIAGNOSIS — J9621 Acute and chronic respiratory failure with hypoxia: Secondary | ICD-10-CM | POA: Diagnosis not present

## 2020-11-12 DIAGNOSIS — M542 Cervicalgia: Secondary | ICD-10-CM | POA: Diagnosis not present

## 2020-11-12 DIAGNOSIS — N183 Chronic kidney disease, stage 3 unspecified: Secondary | ICD-10-CM | POA: Diagnosis not present

## 2020-11-15 DIAGNOSIS — C321 Malignant neoplasm of supraglottis: Secondary | ICD-10-CM | POA: Diagnosis not present

## 2020-11-15 DIAGNOSIS — G609 Hereditary and idiopathic neuropathy, unspecified: Secondary | ICD-10-CM | POA: Diagnosis not present

## 2020-11-15 DIAGNOSIS — E440 Moderate protein-calorie malnutrition: Secondary | ICD-10-CM | POA: Diagnosis not present

## 2020-11-15 DIAGNOSIS — R1310 Dysphagia, unspecified: Secondary | ICD-10-CM | POA: Diagnosis not present

## 2020-11-15 DIAGNOSIS — I69393 Ataxia following cerebral infarction: Secondary | ICD-10-CM | POA: Diagnosis not present

## 2020-11-15 DIAGNOSIS — J9622 Acute and chronic respiratory failure with hypercapnia: Secondary | ICD-10-CM | POA: Diagnosis not present

## 2020-11-15 DIAGNOSIS — I130 Hypertensive heart and chronic kidney disease with heart failure and stage 1 through stage 4 chronic kidney disease, or unspecified chronic kidney disease: Secondary | ICD-10-CM | POA: Diagnosis not present

## 2020-11-15 DIAGNOSIS — N183 Chronic kidney disease, stage 3 unspecified: Secondary | ICD-10-CM | POA: Diagnosis not present

## 2020-11-15 DIAGNOSIS — R490 Dysphonia: Secondary | ICD-10-CM | POA: Diagnosis not present

## 2020-11-15 DIAGNOSIS — I509 Heart failure, unspecified: Secondary | ICD-10-CM | POA: Diagnosis not present

## 2020-11-15 DIAGNOSIS — C139 Malignant neoplasm of hypopharynx, unspecified: Secondary | ICD-10-CM | POA: Diagnosis not present

## 2020-11-15 DIAGNOSIS — Z7901 Long term (current) use of anticoagulants: Secondary | ICD-10-CM | POA: Diagnosis not present

## 2020-11-15 DIAGNOSIS — F419 Anxiety disorder, unspecified: Secondary | ICD-10-CM | POA: Diagnosis not present

## 2020-11-15 DIAGNOSIS — J9621 Acute and chronic respiratory failure with hypoxia: Secondary | ICD-10-CM | POA: Diagnosis not present

## 2020-11-15 DIAGNOSIS — I82509 Chronic embolism and thrombosis of unspecified deep veins of unspecified lower extremity: Secondary | ICD-10-CM | POA: Diagnosis not present

## 2020-11-15 DIAGNOSIS — M542 Cervicalgia: Secondary | ICD-10-CM | POA: Diagnosis not present

## 2020-11-19 DIAGNOSIS — G609 Hereditary and idiopathic neuropathy, unspecified: Secondary | ICD-10-CM | POA: Diagnosis not present

## 2020-11-19 DIAGNOSIS — M542 Cervicalgia: Secondary | ICD-10-CM | POA: Diagnosis not present

## 2020-11-19 DIAGNOSIS — J9621 Acute and chronic respiratory failure with hypoxia: Secondary | ICD-10-CM | POA: Diagnosis not present

## 2020-11-19 DIAGNOSIS — J9622 Acute and chronic respiratory failure with hypercapnia: Secondary | ICD-10-CM | POA: Diagnosis not present

## 2020-11-19 DIAGNOSIS — F419 Anxiety disorder, unspecified: Secondary | ICD-10-CM | POA: Diagnosis not present

## 2020-11-19 DIAGNOSIS — I130 Hypertensive heart and chronic kidney disease with heart failure and stage 1 through stage 4 chronic kidney disease, or unspecified chronic kidney disease: Secondary | ICD-10-CM | POA: Diagnosis not present

## 2020-11-19 DIAGNOSIS — N183 Chronic kidney disease, stage 3 unspecified: Secondary | ICD-10-CM | POA: Diagnosis not present

## 2020-11-19 DIAGNOSIS — I509 Heart failure, unspecified: Secondary | ICD-10-CM | POA: Diagnosis not present

## 2020-11-19 DIAGNOSIS — E440 Moderate protein-calorie malnutrition: Secondary | ICD-10-CM | POA: Diagnosis not present

## 2020-11-19 DIAGNOSIS — I82509 Chronic embolism and thrombosis of unspecified deep veins of unspecified lower extremity: Secondary | ICD-10-CM | POA: Diagnosis not present

## 2020-11-19 DIAGNOSIS — I69393 Ataxia following cerebral infarction: Secondary | ICD-10-CM | POA: Diagnosis not present

## 2020-11-19 DIAGNOSIS — R1310 Dysphagia, unspecified: Secondary | ICD-10-CM | POA: Diagnosis not present

## 2020-11-19 DIAGNOSIS — Z7901 Long term (current) use of anticoagulants: Secondary | ICD-10-CM | POA: Diagnosis not present

## 2020-11-19 DIAGNOSIS — C139 Malignant neoplasm of hypopharynx, unspecified: Secondary | ICD-10-CM | POA: Diagnosis not present

## 2020-11-19 DIAGNOSIS — R490 Dysphonia: Secondary | ICD-10-CM | POA: Diagnosis not present

## 2020-11-19 DIAGNOSIS — C321 Malignant neoplasm of supraglottis: Secondary | ICD-10-CM | POA: Diagnosis not present

## 2020-11-28 DIAGNOSIS — N183 Chronic kidney disease, stage 3 unspecified: Secondary | ICD-10-CM | POA: Diagnosis not present

## 2020-11-28 DIAGNOSIS — E440 Moderate protein-calorie malnutrition: Secondary | ICD-10-CM | POA: Diagnosis not present

## 2020-11-28 DIAGNOSIS — Z7901 Long term (current) use of anticoagulants: Secondary | ICD-10-CM | POA: Diagnosis not present

## 2020-11-28 DIAGNOSIS — R490 Dysphonia: Secondary | ICD-10-CM | POA: Diagnosis not present

## 2020-11-28 DIAGNOSIS — I130 Hypertensive heart and chronic kidney disease with heart failure and stage 1 through stage 4 chronic kidney disease, or unspecified chronic kidney disease: Secondary | ICD-10-CM | POA: Diagnosis not present

## 2020-11-28 DIAGNOSIS — I509 Heart failure, unspecified: Secondary | ICD-10-CM | POA: Diagnosis not present

## 2020-11-28 DIAGNOSIS — C139 Malignant neoplasm of hypopharynx, unspecified: Secondary | ICD-10-CM | POA: Diagnosis not present

## 2020-11-28 DIAGNOSIS — G609 Hereditary and idiopathic neuropathy, unspecified: Secondary | ICD-10-CM | POA: Diagnosis not present

## 2020-11-28 DIAGNOSIS — J9621 Acute and chronic respiratory failure with hypoxia: Secondary | ICD-10-CM | POA: Diagnosis not present

## 2020-11-28 DIAGNOSIS — F419 Anxiety disorder, unspecified: Secondary | ICD-10-CM | POA: Diagnosis not present

## 2020-11-28 DIAGNOSIS — M542 Cervicalgia: Secondary | ICD-10-CM | POA: Diagnosis not present

## 2020-11-28 DIAGNOSIS — C321 Malignant neoplasm of supraglottis: Secondary | ICD-10-CM | POA: Diagnosis not present

## 2020-11-28 DIAGNOSIS — I82509 Chronic embolism and thrombosis of unspecified deep veins of unspecified lower extremity: Secondary | ICD-10-CM | POA: Diagnosis not present

## 2020-11-28 DIAGNOSIS — I69393 Ataxia following cerebral infarction: Secondary | ICD-10-CM | POA: Diagnosis not present

## 2020-11-28 DIAGNOSIS — J9622 Acute and chronic respiratory failure with hypercapnia: Secondary | ICD-10-CM | POA: Diagnosis not present

## 2020-11-28 DIAGNOSIS — R1310 Dysphagia, unspecified: Secondary | ICD-10-CM | POA: Diagnosis not present

## 2020-12-05 DIAGNOSIS — N183 Chronic kidney disease, stage 3 unspecified: Secondary | ICD-10-CM | POA: Diagnosis not present

## 2020-12-05 DIAGNOSIS — I69393 Ataxia following cerebral infarction: Secondary | ICD-10-CM | POA: Diagnosis not present

## 2020-12-05 DIAGNOSIS — M542 Cervicalgia: Secondary | ICD-10-CM | POA: Diagnosis not present

## 2020-12-05 DIAGNOSIS — Z7901 Long term (current) use of anticoagulants: Secondary | ICD-10-CM | POA: Diagnosis not present

## 2020-12-05 DIAGNOSIS — J9622 Acute and chronic respiratory failure with hypercapnia: Secondary | ICD-10-CM | POA: Diagnosis not present

## 2020-12-05 DIAGNOSIS — C321 Malignant neoplasm of supraglottis: Secondary | ICD-10-CM | POA: Diagnosis not present

## 2020-12-05 DIAGNOSIS — J9621 Acute and chronic respiratory failure with hypoxia: Secondary | ICD-10-CM | POA: Diagnosis not present

## 2020-12-05 DIAGNOSIS — C139 Malignant neoplasm of hypopharynx, unspecified: Secondary | ICD-10-CM | POA: Diagnosis not present

## 2020-12-05 DIAGNOSIS — I130 Hypertensive heart and chronic kidney disease with heart failure and stage 1 through stage 4 chronic kidney disease, or unspecified chronic kidney disease: Secondary | ICD-10-CM | POA: Diagnosis not present

## 2020-12-05 DIAGNOSIS — R1310 Dysphagia, unspecified: Secondary | ICD-10-CM | POA: Diagnosis not present

## 2020-12-05 DIAGNOSIS — G609 Hereditary and idiopathic neuropathy, unspecified: Secondary | ICD-10-CM | POA: Diagnosis not present

## 2020-12-05 DIAGNOSIS — R490 Dysphonia: Secondary | ICD-10-CM | POA: Diagnosis not present

## 2020-12-05 DIAGNOSIS — I82509 Chronic embolism and thrombosis of unspecified deep veins of unspecified lower extremity: Secondary | ICD-10-CM | POA: Diagnosis not present

## 2020-12-05 DIAGNOSIS — F419 Anxiety disorder, unspecified: Secondary | ICD-10-CM | POA: Diagnosis not present

## 2020-12-05 DIAGNOSIS — E440 Moderate protein-calorie malnutrition: Secondary | ICD-10-CM | POA: Diagnosis not present

## 2020-12-05 DIAGNOSIS — I509 Heart failure, unspecified: Secondary | ICD-10-CM | POA: Diagnosis not present

## 2020-12-06 DIAGNOSIS — I69393 Ataxia following cerebral infarction: Secondary | ICD-10-CM | POA: Diagnosis not present

## 2020-12-06 DIAGNOSIS — Z7901 Long term (current) use of anticoagulants: Secondary | ICD-10-CM | POA: Diagnosis not present

## 2020-12-06 DIAGNOSIS — M542 Cervicalgia: Secondary | ICD-10-CM | POA: Diagnosis not present

## 2020-12-06 DIAGNOSIS — J9621 Acute and chronic respiratory failure with hypoxia: Secondary | ICD-10-CM | POA: Diagnosis not present

## 2020-12-06 DIAGNOSIS — C139 Malignant neoplasm of hypopharynx, unspecified: Secondary | ICD-10-CM | POA: Diagnosis not present

## 2020-12-06 DIAGNOSIS — Z86711 Personal history of pulmonary embolism: Secondary | ICD-10-CM | POA: Diagnosis not present

## 2020-12-06 DIAGNOSIS — G609 Hereditary and idiopathic neuropathy, unspecified: Secondary | ICD-10-CM | POA: Diagnosis not present

## 2020-12-06 DIAGNOSIS — C321 Malignant neoplasm of supraglottis: Secondary | ICD-10-CM | POA: Diagnosis not present

## 2020-12-06 DIAGNOSIS — R490 Dysphonia: Secondary | ICD-10-CM | POA: Diagnosis not present

## 2020-12-06 DIAGNOSIS — I130 Hypertensive heart and chronic kidney disease with heart failure and stage 1 through stage 4 chronic kidney disease, or unspecified chronic kidney disease: Secondary | ICD-10-CM | POA: Diagnosis not present

## 2020-12-06 DIAGNOSIS — F419 Anxiety disorder, unspecified: Secondary | ICD-10-CM | POA: Diagnosis not present

## 2020-12-06 DIAGNOSIS — Z9181 History of falling: Secondary | ICD-10-CM | POA: Diagnosis not present

## 2020-12-06 DIAGNOSIS — I82509 Chronic embolism and thrombosis of unspecified deep veins of unspecified lower extremity: Secondary | ICD-10-CM | POA: Diagnosis not present

## 2020-12-06 DIAGNOSIS — J9622 Acute and chronic respiratory failure with hypercapnia: Secondary | ICD-10-CM | POA: Diagnosis not present

## 2020-12-06 DIAGNOSIS — R1310 Dysphagia, unspecified: Secondary | ICD-10-CM | POA: Diagnosis not present

## 2020-12-06 DIAGNOSIS — I509 Heart failure, unspecified: Secondary | ICD-10-CM | POA: Diagnosis not present

## 2020-12-06 DIAGNOSIS — N183 Chronic kidney disease, stage 3 unspecified: Secondary | ICD-10-CM | POA: Diagnosis not present

## 2020-12-06 DIAGNOSIS — E440 Moderate protein-calorie malnutrition: Secondary | ICD-10-CM | POA: Diagnosis not present

## 2020-12-12 DIAGNOSIS — I509 Heart failure, unspecified: Secondary | ICD-10-CM | POA: Diagnosis not present

## 2020-12-12 DIAGNOSIS — E1165 Type 2 diabetes mellitus with hyperglycemia: Secondary | ICD-10-CM | POA: Diagnosis not present

## 2020-12-12 DIAGNOSIS — J9621 Acute and chronic respiratory failure with hypoxia: Secondary | ICD-10-CM | POA: Diagnosis not present

## 2020-12-12 DIAGNOSIS — C139 Malignant neoplasm of hypopharynx, unspecified: Secondary | ICD-10-CM | POA: Diagnosis not present

## 2020-12-12 DIAGNOSIS — F419 Anxiety disorder, unspecified: Secondary | ICD-10-CM | POA: Diagnosis not present

## 2020-12-12 DIAGNOSIS — E440 Moderate protein-calorie malnutrition: Secondary | ICD-10-CM | POA: Diagnosis not present

## 2020-12-12 DIAGNOSIS — R490 Dysphonia: Secondary | ICD-10-CM | POA: Diagnosis not present

## 2020-12-12 DIAGNOSIS — Z7901 Long term (current) use of anticoagulants: Secondary | ICD-10-CM | POA: Diagnosis not present

## 2020-12-12 DIAGNOSIS — M542 Cervicalgia: Secondary | ICD-10-CM | POA: Diagnosis not present

## 2020-12-12 DIAGNOSIS — N183 Chronic kidney disease, stage 3 unspecified: Secondary | ICD-10-CM | POA: Diagnosis not present

## 2020-12-12 DIAGNOSIS — J9622 Acute and chronic respiratory failure with hypercapnia: Secondary | ICD-10-CM | POA: Diagnosis not present

## 2020-12-12 DIAGNOSIS — I69393 Ataxia following cerebral infarction: Secondary | ICD-10-CM | POA: Diagnosis not present

## 2020-12-12 DIAGNOSIS — C321 Malignant neoplasm of supraglottis: Secondary | ICD-10-CM | POA: Diagnosis not present

## 2020-12-12 DIAGNOSIS — I82509 Chronic embolism and thrombosis of unspecified deep veins of unspecified lower extremity: Secondary | ICD-10-CM | POA: Diagnosis not present

## 2020-12-12 DIAGNOSIS — R1310 Dysphagia, unspecified: Secondary | ICD-10-CM | POA: Diagnosis not present

## 2020-12-12 DIAGNOSIS — G609 Hereditary and idiopathic neuropathy, unspecified: Secondary | ICD-10-CM | POA: Diagnosis not present

## 2020-12-12 DIAGNOSIS — I130 Hypertensive heart and chronic kidney disease with heart failure and stage 1 through stage 4 chronic kidney disease, or unspecified chronic kidney disease: Secondary | ICD-10-CM | POA: Diagnosis not present

## 2020-12-19 DIAGNOSIS — I509 Heart failure, unspecified: Secondary | ICD-10-CM | POA: Diagnosis not present

## 2020-12-19 DIAGNOSIS — C139 Malignant neoplasm of hypopharynx, unspecified: Secondary | ICD-10-CM | POA: Diagnosis not present

## 2020-12-19 DIAGNOSIS — J9621 Acute and chronic respiratory failure with hypoxia: Secondary | ICD-10-CM | POA: Diagnosis not present

## 2020-12-19 DIAGNOSIS — I69393 Ataxia following cerebral infarction: Secondary | ICD-10-CM | POA: Diagnosis not present

## 2020-12-19 DIAGNOSIS — G609 Hereditary and idiopathic neuropathy, unspecified: Secondary | ICD-10-CM | POA: Diagnosis not present

## 2020-12-19 DIAGNOSIS — R490 Dysphonia: Secondary | ICD-10-CM | POA: Diagnosis not present

## 2020-12-19 DIAGNOSIS — N183 Chronic kidney disease, stage 3 unspecified: Secondary | ICD-10-CM | POA: Diagnosis not present

## 2020-12-19 DIAGNOSIS — C321 Malignant neoplasm of supraglottis: Secondary | ICD-10-CM | POA: Diagnosis not present

## 2020-12-19 DIAGNOSIS — F419 Anxiety disorder, unspecified: Secondary | ICD-10-CM | POA: Diagnosis not present

## 2020-12-19 DIAGNOSIS — I82509 Chronic embolism and thrombosis of unspecified deep veins of unspecified lower extremity: Secondary | ICD-10-CM | POA: Diagnosis not present

## 2020-12-19 DIAGNOSIS — J9622 Acute and chronic respiratory failure with hypercapnia: Secondary | ICD-10-CM | POA: Diagnosis not present

## 2020-12-19 DIAGNOSIS — R1310 Dysphagia, unspecified: Secondary | ICD-10-CM | POA: Diagnosis not present

## 2020-12-19 DIAGNOSIS — M542 Cervicalgia: Secondary | ICD-10-CM | POA: Diagnosis not present

## 2020-12-19 DIAGNOSIS — I130 Hypertensive heart and chronic kidney disease with heart failure and stage 1 through stage 4 chronic kidney disease, or unspecified chronic kidney disease: Secondary | ICD-10-CM | POA: Diagnosis not present

## 2020-12-19 DIAGNOSIS — Z7901 Long term (current) use of anticoagulants: Secondary | ICD-10-CM | POA: Diagnosis not present

## 2020-12-19 DIAGNOSIS — E440 Moderate protein-calorie malnutrition: Secondary | ICD-10-CM | POA: Diagnosis not present

## 2020-12-26 DIAGNOSIS — C139 Malignant neoplasm of hypopharynx, unspecified: Secondary | ICD-10-CM | POA: Diagnosis not present

## 2020-12-26 DIAGNOSIS — R1310 Dysphagia, unspecified: Secondary | ICD-10-CM | POA: Diagnosis not present

## 2020-12-26 DIAGNOSIS — J9621 Acute and chronic respiratory failure with hypoxia: Secondary | ICD-10-CM | POA: Diagnosis not present

## 2020-12-26 DIAGNOSIS — C321 Malignant neoplasm of supraglottis: Secondary | ICD-10-CM | POA: Diagnosis not present

## 2020-12-26 DIAGNOSIS — M542 Cervicalgia: Secondary | ICD-10-CM | POA: Diagnosis not present

## 2020-12-26 DIAGNOSIS — G609 Hereditary and idiopathic neuropathy, unspecified: Secondary | ICD-10-CM | POA: Diagnosis not present

## 2020-12-26 DIAGNOSIS — N183 Chronic kidney disease, stage 3 unspecified: Secondary | ICD-10-CM | POA: Diagnosis not present

## 2020-12-26 DIAGNOSIS — I82509 Chronic embolism and thrombosis of unspecified deep veins of unspecified lower extremity: Secondary | ICD-10-CM | POA: Diagnosis not present

## 2020-12-26 DIAGNOSIS — R490 Dysphonia: Secondary | ICD-10-CM | POA: Diagnosis not present

## 2020-12-26 DIAGNOSIS — F419 Anxiety disorder, unspecified: Secondary | ICD-10-CM | POA: Diagnosis not present

## 2020-12-26 DIAGNOSIS — J9622 Acute and chronic respiratory failure with hypercapnia: Secondary | ICD-10-CM | POA: Diagnosis not present

## 2020-12-26 DIAGNOSIS — I69393 Ataxia following cerebral infarction: Secondary | ICD-10-CM | POA: Diagnosis not present

## 2020-12-26 DIAGNOSIS — I509 Heart failure, unspecified: Secondary | ICD-10-CM | POA: Diagnosis not present

## 2020-12-26 DIAGNOSIS — Z7901 Long term (current) use of anticoagulants: Secondary | ICD-10-CM | POA: Diagnosis not present

## 2020-12-26 DIAGNOSIS — E440 Moderate protein-calorie malnutrition: Secondary | ICD-10-CM | POA: Diagnosis not present

## 2020-12-26 DIAGNOSIS — I130 Hypertensive heart and chronic kidney disease with heart failure and stage 1 through stage 4 chronic kidney disease, or unspecified chronic kidney disease: Secondary | ICD-10-CM | POA: Diagnosis not present

## 2021-01-01 ENCOUNTER — Other Ambulatory Visit: Payer: Self-pay | Admitting: Physical Medicine and Rehabilitation

## 2021-01-01 DIAGNOSIS — C139 Malignant neoplasm of hypopharynx, unspecified: Secondary | ICD-10-CM | POA: Diagnosis not present

## 2021-01-01 DIAGNOSIS — I69393 Ataxia following cerebral infarction: Secondary | ICD-10-CM | POA: Diagnosis not present

## 2021-01-01 DIAGNOSIS — J9621 Acute and chronic respiratory failure with hypoxia: Secondary | ICD-10-CM | POA: Diagnosis not present

## 2021-01-01 DIAGNOSIS — E440 Moderate protein-calorie malnutrition: Secondary | ICD-10-CM | POA: Diagnosis not present

## 2021-01-01 DIAGNOSIS — C321 Malignant neoplasm of supraglottis: Secondary | ICD-10-CM | POA: Diagnosis not present

## 2021-01-01 DIAGNOSIS — R1310 Dysphagia, unspecified: Secondary | ICD-10-CM | POA: Diagnosis not present

## 2021-01-01 DIAGNOSIS — I509 Heart failure, unspecified: Secondary | ICD-10-CM | POA: Diagnosis not present

## 2021-01-01 DIAGNOSIS — F419 Anxiety disorder, unspecified: Secondary | ICD-10-CM | POA: Diagnosis not present

## 2021-01-01 DIAGNOSIS — J9622 Acute and chronic respiratory failure with hypercapnia: Secondary | ICD-10-CM | POA: Diagnosis not present

## 2021-01-01 DIAGNOSIS — I130 Hypertensive heart and chronic kidney disease with heart failure and stage 1 through stage 4 chronic kidney disease, or unspecified chronic kidney disease: Secondary | ICD-10-CM | POA: Diagnosis not present

## 2021-01-01 DIAGNOSIS — G609 Hereditary and idiopathic neuropathy, unspecified: Secondary | ICD-10-CM | POA: Diagnosis not present

## 2021-01-01 DIAGNOSIS — Z7901 Long term (current) use of anticoagulants: Secondary | ICD-10-CM | POA: Diagnosis not present

## 2021-01-01 DIAGNOSIS — R490 Dysphonia: Secondary | ICD-10-CM | POA: Diagnosis not present

## 2021-01-01 DIAGNOSIS — M542 Cervicalgia: Secondary | ICD-10-CM | POA: Diagnosis not present

## 2021-01-01 DIAGNOSIS — I82509 Chronic embolism and thrombosis of unspecified deep veins of unspecified lower extremity: Secondary | ICD-10-CM | POA: Diagnosis not present

## 2021-01-01 DIAGNOSIS — N183 Chronic kidney disease, stage 3 unspecified: Secondary | ICD-10-CM | POA: Diagnosis not present

## 2021-01-12 DIAGNOSIS — E1165 Type 2 diabetes mellitus with hyperglycemia: Secondary | ICD-10-CM | POA: Diagnosis not present

## 2021-01-23 DIAGNOSIS — Z993 Dependence on wheelchair: Secondary | ICD-10-CM | POA: Diagnosis not present

## 2021-01-23 DIAGNOSIS — I6930 Unspecified sequelae of cerebral infarction: Secondary | ICD-10-CM | POA: Diagnosis not present

## 2021-01-23 DIAGNOSIS — F418 Other specified anxiety disorders: Secondary | ICD-10-CM | POA: Diagnosis not present

## 2021-01-23 DIAGNOSIS — M542 Cervicalgia: Secondary | ICD-10-CM | POA: Diagnosis not present

## 2021-01-23 DIAGNOSIS — Z85819 Personal history of malignant neoplasm of unspecified site of lip, oral cavity, and pharynx: Secondary | ICD-10-CM | POA: Diagnosis not present

## 2021-01-23 DIAGNOSIS — Z23 Encounter for immunization: Secondary | ICD-10-CM | POA: Diagnosis not present

## 2021-01-23 DIAGNOSIS — I10 Essential (primary) hypertension: Secondary | ICD-10-CM | POA: Diagnosis not present

## 2021-01-23 DIAGNOSIS — E7800 Pure hypercholesterolemia, unspecified: Secondary | ICD-10-CM | POA: Diagnosis not present

## 2021-01-23 DIAGNOSIS — R54 Age-related physical debility: Secondary | ICD-10-CM | POA: Diagnosis not present

## 2021-01-30 ENCOUNTER — Other Ambulatory Visit: Payer: Medicare Other | Admitting: *Deleted

## 2021-01-30 ENCOUNTER — Other Ambulatory Visit: Payer: Self-pay

## 2021-01-30 VITALS — BP 170/90 | HR 77 | Temp 97.7°F | Resp 18

## 2021-01-30 DIAGNOSIS — Z515 Encounter for palliative care: Secondary | ICD-10-CM

## 2021-01-30 NOTE — Progress Notes (Signed)
[facility] PALLIATIVE CARE RN NOTE  PATIENT NAME: Katelyn [name] DOB: [birth year] MRN: [id number]  PRIMARY CARE PROVIDER: Aretta Nip, MD  RESPONSIBLE PARTY: Katelyn [name] (daughter) Acct ID - Guarantor Home Phone Work Phone Relationship Acct Type  [id number] MAKEBA, [phone]  Self P/F     [address], [place], [place] [address]   Covid-19 Pre-screening Negative  PLAN OF CARE and INTERVENTION:  ADVANCE CARE PLANNING/GOALS OF CARE: Goal is for patient to remain in her home and avoid hospitalizations. She is a Full code. PATIENT/CAREGIVER EDUCATION: Symptom management, safe mobility/transfers DISEASE STATUS: RN follow-up palliative care visit made. Met with patient and her daughter, [name], in patient's home. Patient remains alert and oriented x 4. She denies pain at this time, however does experience pain in her R wrist/arm and L hip. She continues to take Tylenol to help. She denies any further pain/issues with her left neck. She had a follow-up appointment with Dr. Zadie Rhine last week. No changes made to her plan of care or medications. Daughter says that MD wants patient to make sure that she is taking her blood pressure medication as prescribed. She did receive her flu vaccination while in the office. She has a non-productive cough at times. No coughing or shortness of breath noted during visit. She wears her oxygen via trach collar during the night. Daughter reports that patient has felt sad and depressed recently as she lost a long time friend last month and was unable to attend her funeral. She remains ambulatory using a rollator walker, but at a slow pace. She sometimes requires assistance with bathing and dressing. She has intermittent bladder incontinence and wears Depends. She was very happy to tell me that her daughter took her out to the store, [employer], which was a goal of hers. Next visit scheduled in 1 month.   HISTORY OF PRESENT ILLNESS:  This is a 70  yo female with a history of essential hypertension, cancer of hypopharynx, COPD, dysphagia, CKD Stage 3 and difficult airway. Palliative care team continues to follow patient for goals of care and complex decision making.    CODE STATUS: Full code ADVANCED DIRECTIVES: Y MOST FORM: no PPS: 50%   PHYSICAL EXAM:   VITALS: Today's Vitals   01/30/21 0132  BP: (!) 170/90  Pulse: 77  Resp: 18  Temp: 97.7 F (36.5 C)  TempSrc: Temporal  SpO2: 94%  PainSc: 0-No pain    LUNGS: clear to auscultation  CARDIAC: Cor RRR EXTREMITIES: Trace edema in lower extremities SKIN:  Exposed skin is dry and intact   NEURO:  Alert and oriented x 4, difficult to hear due to tracheostomy, generalized weakness, ambulatory w/rollator walker   (Duration of visit and documentation 60 minutes)   Daryl Eastern, RN BSN

## 2021-02-12 ENCOUNTER — Telehealth: Payer: Self-pay | Admitting: Pulmonary Disease

## 2021-02-12 DIAGNOSIS — Z430 Encounter for attention to tracheostomy: Secondary | ICD-10-CM

## 2021-02-12 NOTE — Telephone Encounter (Signed)
Call made to [facility], Confirmed patient DOB. Per [place] Pathologist she thinks patient should transition to Laryngectomy tube for her trach. Denies any other recommendations. States this would be the best option for this patient at this time.   JD please advise if this is okay. Thanks

## 2021-02-19 DIAGNOSIS — D696 Thrombocytopenia, unspecified: Secondary | ICD-10-CM | POA: Diagnosis not present

## 2021-02-19 NOTE — Addendum Note (Signed)
Addended by: Elby Beck R on: 02/19/2021 02:46 PM   Modules accepted: Orders

## 2021-02-19 NOTE — Telephone Encounter (Addendum)
Called and spoke with daughter [name] who states that they are needing trach supplies for patient. She states that she called [employer] because that's who she used last time and they stated that they don't have a current order on her. New order has been placed. Also secure chat sent to Doris Cheadle to try and see about getting patient scheduled for another appt with trach clinic. Nothing further needed at this time.

## 2021-02-22 DIAGNOSIS — Z930 Tracheostomy status: Secondary | ICD-10-CM | POA: Diagnosis not present

## 2021-03-05 ENCOUNTER — Other Ambulatory Visit: Payer: Medicare Other | Admitting: *Deleted

## 2021-03-05 ENCOUNTER — Other Ambulatory Visit: Payer: Self-pay

## 2021-03-05 VITALS — BP 183/91 | HR 71 | Temp 97.7°F | Resp 18

## 2021-03-05 DIAGNOSIS — Z515 Encounter for palliative care: Secondary | ICD-10-CM

## 2021-03-05 NOTE — Progress Notes (Signed)
[facility] PALLIATIVE CARE RN NOTE  PATIENT NAME: Katelyn [name] DOB: 02/17/1951 MRN: [id number]  PRIMARY CARE PROVIDER: Aretta Nip, MD  RESPONSIBLE PARTY: [name] (daughter) Acct ID - Guarantor Home Phone Work Phone Relationship Acct Type  [id number] VALLORIE, [phone]  Self P/F     [address] [place], [place] [address]   Covid-19 Pre-screening Negative  PLAN OF CARE and INTERVENTION:  ADVANCE CARE PLANNING/GOALS OF CARE: Goal is for patient to remain in her home. She is a Full code. PATIENT/CAREGIVER EDUCATION: Symptom management, importance of taking her medications as prescribed, safe mobility/transfers DISEASE STATUS: RN palliative care follow up visit completed with patient and her daughter [name]. Upon arrival, patient is sitting up in her dining room awake and alert. She is able to answer questions appropriately. Daughter has noticed that patient is becoming more forgetful especially in regards to her short term memory. She wonders if patient could be starting with some dementia and this concerns her. She denies pain at this time, but she continues with chronic pain to her right wrist/arm, right hip and leg. She reports taking Tramadol on occasion if pain is more severe. She is eating less. She has gone from eating 2 meals/day (breakfast/lunch) to mainly just eating lunch. She says she does not have much of an appetite. Reviewed patient's medications and she is not being compliant. She admitted that she is only taking her Metoprolol once daily and is not taking her other medications at all (Eliquis and Lipitor). Discussed the importance of taking her medications as prescribed. [name] states that she has been suspecting this all along due to patient's continued elevated blood pressures. As I was leaving, her significant other [name] was sitting in the driveway in his car. He reports that he often finds her pills on the floor and that patient takes  them out of the pill box as if she has taken them, but does not. She had not taken her medications as of yet during my visit so her blood pressure was elevated at 183/91, pulse 71. She remains ambulatory using her rollator walker but needs assistance with bathing and at times dressing. No coughing noticed during visit. She continues wearing her oxygen during the night via trach collar. Will continue to monitor.  HISTORY OF PRESENT ILLNESS:  This is a 71 yo female with a history of essential hypertension, cancer of hypopharynx, COPD, dysphagia, CKD Stage 3 and difficult airway. Palliative care team continues to follow patient for goals of care and complex decision making.    CODE STATUS: Full code ADVANCED DIRECTIVES: Y MOST FORM: no PPS: 50%   PHYSICAL EXAM:   VITALS: Today's Vitals   03/05/21 1122  BP: (!) 183/91  Pulse: 71  Resp: 18  Temp: 97.7 F (36.5 C)  TempSrc: Temporal  SpO2: 95%  PainSc: 0-No pain    LUNGS: clear to auscultation  CARDIAC: Cor RRR EXTREMITIES: No edema SKIN: Skin color, texture, turgor normal. No rashes or lesions  NEURO:  Alert and oriented to person/place/time, forgetful, generalized weakness, ambulatory w/rollator walker   (Duration of visit and documentation 75 minutes)    Daryl Eastern, RN BSN

## 2021-03-06 ENCOUNTER — Other Ambulatory Visit: Payer: Self-pay

## 2021-03-06 ENCOUNTER — Emergency Department (HOSPITAL_COMMUNITY)
Admission: EM | Admit: 2021-03-06 | Discharge: 2021-03-06 | Disposition: A | Discharge status: Home / Self Care | Payer: Medicare Other | Attending: Emergency Medicine | Admitting: Emergency Medicine

## 2021-03-06 ENCOUNTER — Encounter (HOSPITAL_COMMUNITY): Payer: Self-pay | Admitting: Emergency Medicine

## 2021-03-06 ENCOUNTER — Emergency Department (HOSPITAL_COMMUNITY): Payer: Medicare Other

## 2021-03-06 DIAGNOSIS — W010XXA Fall on same level from slipping, tripping and stumbling without subsequent striking against object, initial encounter: Secondary | ICD-10-CM | POA: Insufficient documentation

## 2021-03-06 DIAGNOSIS — Z7901 Long term (current) use of anticoagulants: Secondary | ICD-10-CM | POA: Diagnosis not present

## 2021-03-06 DIAGNOSIS — W19XXXA Unspecified fall, initial encounter: Secondary | ICD-10-CM | POA: Diagnosis not present

## 2021-03-06 DIAGNOSIS — S82842A Displaced bimalleolar fracture of left lower leg, initial encounter for closed fracture: Secondary | ICD-10-CM | POA: Insufficient documentation

## 2021-03-06 DIAGNOSIS — R600 Localized edema: Secondary | ICD-10-CM | POA: Diagnosis not present

## 2021-03-06 DIAGNOSIS — R531 Weakness: Secondary | ICD-10-CM | POA: Diagnosis not present

## 2021-03-06 DIAGNOSIS — S82892A Other fracture of left lower leg, initial encounter for closed fracture: Secondary | ICD-10-CM

## 2021-03-06 DIAGNOSIS — M25572 Pain in left ankle and joints of left foot: Secondary | ICD-10-CM | POA: Diagnosis not present

## 2021-03-06 DIAGNOSIS — S99912A Unspecified injury of left ankle, initial encounter: Secondary | ICD-10-CM | POA: Diagnosis present

## 2021-03-06 MED ORDER — ACETAMINOPHEN 325 MG PO TABS
650.0000 mg | ORAL_TABLET | Freq: Once | ORAL | Status: AC
Start: 2021-03-06 — End: 2021-03-06
  Administered 2021-03-06: 650 mg via ORAL
  Filled 2021-03-06: qty 2

## 2021-03-06 MED ORDER — TRAMADOL HCL 50 MG PO TABS
50.0000 mg | ORAL_TABLET | Freq: Once | ORAL | Status: AC
  Administered 2021-03-06: 50 mg via ORAL
  Filled 2021-03-06: qty 1

## 2021-03-06 MED ORDER — TRAMADOL HCL 50 MG PO TABS: 50.0000 mg | ORAL_TABLET | Freq: Four times a day (QID) | ORAL | 0 refills | Status: DC | PRN

## 2021-03-06 NOTE — Discharge Instructions (Addendum)
Continue taking Tylenol anywhere from 500 to 650 mg every 6 hours as needed for pain.  If you continue to have pain despite taking Tylenol you may add tramadol in addition to this.  Avoid bearing any weight on the left ankle.  Advised use of a wheelchair for transportation needs.  Advised immediate return for fevers or cough vomiting diarrhea worsening pain or any additional concerns.

## 2021-03-06 NOTE — ED Notes (Signed)
Patient's daughter is at bedside.  Has additional questions and concerns for MD.

## 2021-03-06 NOTE — ED Notes (Signed)
Patient taken to xray at this time.

## 2021-03-06 NOTE — ED Triage Notes (Signed)
Pt BIBA Per EMS: Pt coming from home w/ c/o fall today. Swelling to left ankle. Denies any other injuries or pain. Pt denies hitting head or LOC; does take thinners.  132/80  80 HR  96% RA  A&Ox4

## 2021-03-06 NOTE — ED Provider Notes (Addendum)
[facility] DEPT Provider Note   CSN: [id number] Arrival date & time: 03/06/21  1841     History  Chief Complaint  Patient presents with   Ankle Pain    Katelyn [name] is a 71 y.o. female.  Patient presents with complaint of left ankle pain.  She states that she tripped and rolled her left ankle.  Denies head injury.  Denies loss conscious.  Denies hip pain or knee pain.  Denies neck pain or headache or back pain.  No reports of fevers or cough or vomiting or diarrhea.      Home Medications Prior to Admission medications   Medication Sig Start Date End Date Taking? Authorizing Provider  traMADol (ULTRAM) 50 MG tablet Take 1 tablet (50 mg total) by mouth every 6 (six) hours as needed. 03/06/21  Yes Luna Fuse, MD  albuterol (PROVENTIL) (2.5 MG/3ML) 0.083% nebulizer solution Take 3 mLs (2.5 mg total) by nebulization every 6 (six) hours as needed for wheezing or shortness of breath. 02/03/20   Freddi Starr, MD  ALPRAZolam Duanne Moron) 0.5 MG tablet Take 1 tablet (0.5 mg total) by mouth 3 (three) times daily as needed for anxiety. 11/16/19   Little Ishikawa, MD  apixaban (ELIQUIS) 5 MG TABS tablet Take 1 tablet (5 mg total) by mouth 2 (two) times daily. 11/04/19   Love, Ivan Anchors, PA-C  atorvastatin (LIPITOR) 80 MG tablet Take 1 tablet (80 mg total) by mouth daily. 10/18/19   Swayze, Ava, DO  furosemide (LASIX) 20 MG tablet Take 1 tablet (20 mg total) by mouth daily for 3 days. 02/27/20 03/01/20  Deno Etienne, DO  guaiFENesin-dextromethorphan (ROBITUSSIN DM) 100-10 MG/5ML syrup Take 10 mLs by mouth every 6 (six) hours. 10/18/19   Swayze, Ava, DO  melatonin 3 MG TABS tablet Take 1 tablet (3 mg total) by mouth at bedtime as needed (insomnia). 11/04/19   Love, Ivan Anchors, PA-C  metoprolol tartrate (LOPRESSOR) 25 MG tablet Take 1 tablet (25 mg total) by mouth 2 (two) times daily. 11/04/19   Love, Ivan Anchors, PA-C  Multiple Vitamins-Minerals (MULTIVITAMIN WITH  MINERALS) tablet Take 1 tablet by mouth daily.    [provider]  nystatin cream (MYCOSTATIN) Apply 1 application topically 2 (two) times daily. Patient not taking: Reported on 02/08/2020 11/04/19   Love, Ivan Anchors, PA-C  polycarbophil (FIBERCON) 625 MG tablet Take 1 tablet (625 mg total) by mouth daily. Patient not taking: Reported on 02/08/2020 11/05/19   Bary Leriche, PA-C      Allergies    Patient has no known allergies.    Review of Systems   Review of Systems  Constitutional:  Negative for fever.  HENT:  Negative for ear pain.   Eyes:  Negative for pain.  Respiratory:  Negative for cough.   Cardiovascular:  Negative for chest pain.  Gastrointestinal:  Negative for abdominal pain.  Genitourinary:  Negative for flank pain.  Musculoskeletal:  Negative for back pain.  Skin:  Negative for rash.  Neurological:  Negative for headaches.   Physical Exam Updated Vital Signs BP 95/82 (BP Location: Left Arm)    Pulse 77    Temp 97.7 F (36.5 C) (Oral)    Resp 18    SpO2 99%  Physical Exam Constitutional:      General: She is not in acute distress.    Appearance: Normal appearance.  HENT:     Head: Normocephalic.     Nose: Nose normal.  Eyes:  Extraocular Movements: Extraocular movements intact.  Cardiovascular:     Rate and Rhythm: Normal rate.  Pulmonary:     Effort: Pulmonary effort is normal.  Musculoskeletal:     Cervical back: Normal range of motion.     Comments: Normal range of motion of bilateral shoulders elbows wrists and bilateral hips and knees.  Left ankle is tender and has decreased range of motion secondary to pain.  Otherwise neurovascular intact distally dorsalis pedis pulses are 2+ and intact.  Cap refill is less than 2 seconds.  Compartments soft.    Neurological:     General: No focal deficit present.     Mental Status: She is alert. Mental status is at baseline.    ED Results / Procedures / Treatments   Labs (all labs ordered are listed,  but only abnormal results are displayed) Labs Reviewed - No data to display  EKG None  Radiology DG Ankle Complete Left  Result Date: 03/06/2021 CLINICAL DATA:  ankle pain EXAM: LEFT ANKLE COMPLETE - 3+ VIEW COMPARISON:  None. FINDINGS: Transsyndesmotic minimally displaced distal fibular fracture. Minimally displaced transverse fracture through the medial malleolus. No definite posterior malleolar fracture. No definite talar fracture. Ankle effusion is noted. No widening of the medial or lateral clear space on Mortise view.there is no evidence of arthropathy or other focal bone abnormality. Severe subcutaneus soft tissue edema of the lateral ankle. Mild a soft tissue edema of the medial ankle. IMPRESSION: Minimally displaced bimalleolar fracture with associated marked subcutaneus soft tissue edema and ankle effusion. Electronically Signed   By: Iven Finn M.D.   On: 03/06/2021 19:27    Procedures .Ortho Injury Treatment  Date/Time: 03/06/2021 8:29 PM Performed by: Luna Fuse, MD Authorized by: Luna Fuse, MD  Immobilization: splint Splint type: short leg Post-procedure neurovascular assessment: post-procedure neurovascularly intact      Medications Ordered in ED Medications  acetaminophen (TYLENOL) tablet 650 mg (has no administration in time range)  traMADol (ULTRAM) tablet 50 mg (has no administration in time range)    ED Course/ Medical Decision Making/ A&P                           Medical Decision Making  Patient presents with left ankle bimalleolar fracture minimal displacement.  Given tramadol here for pain management.  Placed in a splint to the left lower extremity.  Neurovascular intact after placement.  Patient has an orthopedist with whom she follows, recommending him to call the orthopedist tomorrow for an appointment within the week.  Family otherwise states that the patient already has 24-hour care at home which would be appropriate given her  injury.  Advised immediate return for worsening pain fevers or any additional concerns.          Final Clinical Impression(s) / ED Diagnoses Final diagnoses:  Closed fracture of left ankle, initial encounter    Rx / DC Orders ED Discharge Orders          Ordered    traMADol (ULTRAM) 50 MG tablet  Every 6 hours PRN        03/06/21 2028              Luna Fuse, MD 03/06/21 2027    Luna Fuse, MD 03/06/21 2029

## 2021-03-07 ENCOUNTER — Other Ambulatory Visit: Payer: Medicare Other | Admitting: *Deleted

## 2021-03-07 DIAGNOSIS — Z515 Encounter for palliative care: Secondary | ICD-10-CM

## 2021-03-07 NOTE — Progress Notes (Signed)
COMM[facility]ITY PALLIATIVE CARE SW NOTE  PATIENT NAME: Katelyn [name] DOB: 01/21/1951 MRN: [id number]  PRIMARY CARE PROVIDER: Aretta Nip, MD  RESPONSIBLE PARTY: There is no guarantor information entered for this encounter.  PC SW outreached patients daughter, [name], to address and discuss patients needs.  Daughter shared that patient recently fell and fx her ankle an d is need of an ortho follow up. Currently transportation is an barrier as patient needs to transport via a stretcher, due to patients physical decline and weakness. SW discussed stretcher transportation options and informed dughter that there are fees associated. Daughter stated understanding.  Daughter also shared that she is interested in exploring private caregiver services for patient as patient requires assistance with all ADL's. Daughter shared that they have tried applying for MCD and have been denied. Daughter is aware that caregiver services will be an out of pocket expense.   SW shared the following resources with daughter:  [facility] ?[phone]  [facility])?[phone]  [facility]  [phone] [facility] [phone]     SOCIAL HX:  Social History   Tobacco Use   Smoking status: Former    Packs/day: 3.00    Years: 48.00    Pack years: 144.00    Types: Cigarettes    Start date: 15    Quit date: 05/29/2017    Years since quitting: 3.7   Smokeless tobacco: Never   Tobacco comments:    1 carton lasted 3 days  Substance Use Topics   Alcohol use: No    Alcohol/week: 0.0 standard drinks        [place], Yell

## 2021-03-12 DIAGNOSIS — R404 Transient alteration of awareness: Secondary | ICD-10-CM | POA: Diagnosis not present

## 2021-03-12 DIAGNOSIS — R531 Weakness: Secondary | ICD-10-CM | POA: Diagnosis not present

## 2021-03-12 DIAGNOSIS — S8292XS Unspecified fracture of left lower leg, sequela: Secondary | ICD-10-CM | POA: Diagnosis not present

## 2021-03-12 DIAGNOSIS — R0689 Other abnormalities of breathing: Secondary | ICD-10-CM | POA: Diagnosis not present

## 2021-03-12 DIAGNOSIS — Z7401 Bed confinement status: Secondary | ICD-10-CM | POA: Diagnosis not present

## 2021-03-12 DIAGNOSIS — I10 Essential (primary) hypertension: Secondary | ICD-10-CM | POA: Diagnosis not present

## 2021-03-12 NOTE — Progress Notes (Signed)
[facility] PALLIATIVE CARE RN NOTE  PATIENT NAME: Katelyn [name] DOB: [birth year] MRN: [id number]  PRIMARY CARE PROVIDER: Aretta Nip, MD  RESPONSIBLE PARTY: [name] (daughter) Acct ID - Guarantor Home Phone Work Phone Relationship Acct Type  [id number] ROSALVA, [name]  Self P/F     [address], [place], [place] [address]   Due to the COVID-19 crisis, this virtual check-in visit was done via telephone from my office and it was initiated and consent by this patient and or family.  RN telephonic encounter completed with patient's daughter [name]. She reports that patient had a fall yesterday ambulating with her walker from the dining room. Daughter took patient to the ED and she was found to have a fractured left ankle. Daughter is concerned because patient is now non-weight bearing and will require more assistance with ADLs. She is inquiring about getting transportation in order for her to see an Orthopedic doctor. She is also wanting information on in home caregivers to assist patient with personal care needs. I have reached out to one of our [facility] workers who agreed to contact daughter to provide her with requested information.    (Duration of visit and documentation 30 minutes)   Daryl Eastern, RN BSN

## 2021-03-21 ENCOUNTER — Other Ambulatory Visit: Payer: Medicare Other | Admitting: *Deleted

## 2021-03-21 ENCOUNTER — Other Ambulatory Visit: Payer: Self-pay

## 2021-03-21 DIAGNOSIS — Z515 Encounter for palliative care: Secondary | ICD-10-CM

## 2021-03-26 DIAGNOSIS — R404 Transient alteration of awareness: Secondary | ICD-10-CM | POA: Diagnosis not present

## 2021-03-26 DIAGNOSIS — R531 Weakness: Secondary | ICD-10-CM | POA: Diagnosis not present

## 2021-03-26 DIAGNOSIS — Z7401 Bed confinement status: Secondary | ICD-10-CM | POA: Diagnosis not present

## 2021-03-26 DIAGNOSIS — S8292XS Unspecified fracture of left lower leg, sequela: Secondary | ICD-10-CM | POA: Diagnosis not present

## 2021-04-03 ENCOUNTER — Other Ambulatory Visit: Payer: Medicare Other | Admitting: *Deleted

## 2021-04-03 ENCOUNTER — Other Ambulatory Visit: Payer: Self-pay

## 2021-04-03 VITALS — BP 206/91 | HR 71 | Temp 97.6°F | Resp 18

## 2021-04-03 DIAGNOSIS — Z515 Encounter for palliative care: Secondary | ICD-10-CM

## 2021-04-05 ENCOUNTER — Other Ambulatory Visit: Payer: Medicare Other | Admitting: *Deleted

## 2021-04-05 ENCOUNTER — Other Ambulatory Visit: Payer: Self-pay

## 2021-04-05 DIAGNOSIS — Z515 Encounter for palliative care: Secondary | ICD-10-CM

## 2021-04-05 NOTE — Progress Notes (Signed)
PATIENT NAME: Katelyn [name] DOB: May 10, 1950 MRN: [id number]  PRIMARY CARE PROVIDER: Aretta Nip, MD  RESPONSIBLE PARTY: [name] (daughter) Acct ID - Guarantor Home Phone Work Phone Relationship Acct Type  [id number] MARLEAH, [phone]  Self P/F     [address], [place], [place] [address]   RN delivered a large pack of briefs to patient's home today provided by community donations. I sent a communication to patient's daughter to let her know and she is appreciative.  (Duration of visit and documentation 10 minutes)   Daryl Eastern, RN BSN

## 2021-04-14 NOTE — Progress Notes (Signed)
[facility] RN NOTE  PATIENT NAME: Katelyn [name] DOB: 05/29/1950 MRN: [id number]  PRIMARY CARE PROVIDER: Aretta Nip, MD  RESPONSIBLE PARTY: [name] (daughter) Acct ID - Guarantor Home Phone Work Phone Relationship Acct Type  [id number] YAMEL, [phone]  Self P/F     [address] [place], [place] [address]   Covid-19 Pre-screening Negative  RN follow up visit completed with patient and her daughter-[name]. I also spoke with her significant other [name] at end of visit. Patient currently sitting up in her wheelchair at the dining table awake and alert. Patient had a fall last month in her home and fractured her left ankle. She has had a follow up appointment with her Orthopedic doctor and is wearing a boot. She is allowed to bear weight. [name] states that he is having to assist her with all ADLs, except feeding. Patient afraid to bear weight. She is transported via wheelchair and given sponge baths in the bed. Daughter states that the doctor wants to see if the ankle will heal on it's own and only wants to perform surgery as a last resort. He wants to give patient 4 more weeks in the boot and see what happens. Her next Ortho appointment is 04/23/21. They are using non-emergency transport for transportation to these appointments. She had a recent visit by a RN with Dr. [name] office and patient is now part of their Chronic Care Management program. Her case manager is Mosetta Pigeon and her next appointment with patient is 05/14/21 at [facility] via telephone. Patient has an elevated BP today, but has not yet taken her BP medications. Daughter and [name] states that patient has been taking her medications better as prescribed. Next palliative care follow up visit scheduled in 6 weeks.    (Duration of visit and documentation 60 minutes)   Daryl Eastern, RN BSN

## 2021-04-21 ENCOUNTER — Emergency Department (HOSPITAL_COMMUNITY)
Admission: EM | Admit: 2021-04-21 | Discharge: 2021-04-21 | Disposition: A | Discharge status: Home / Self Care | Payer: Medicare Other | Attending: Emergency Medicine | Admitting: Emergency Medicine

## 2021-04-21 ENCOUNTER — Encounter (HOSPITAL_COMMUNITY): Payer: Self-pay

## 2021-04-21 ENCOUNTER — Emergency Department (HOSPITAL_COMMUNITY): Payer: Medicare Other

## 2021-04-21 ENCOUNTER — Other Ambulatory Visit: Payer: Self-pay

## 2021-04-21 DIAGNOSIS — J45909 Unspecified asthma, uncomplicated: Secondary | ICD-10-CM | POA: Insufficient documentation

## 2021-04-21 DIAGNOSIS — Z79899 Other long term (current) drug therapy: Secondary | ICD-10-CM | POA: Diagnosis not present

## 2021-04-21 DIAGNOSIS — R7309 Other abnormal glucose: Secondary | ICD-10-CM | POA: Diagnosis not present

## 2021-04-21 DIAGNOSIS — R6889 Other general symptoms and signs: Secondary | ICD-10-CM | POA: Diagnosis not present

## 2021-04-21 DIAGNOSIS — K5900 Constipation, unspecified: Secondary | ICD-10-CM | POA: Diagnosis not present

## 2021-04-21 DIAGNOSIS — I10 Essential (primary) hypertension: Secondary | ICD-10-CM | POA: Insufficient documentation

## 2021-04-21 DIAGNOSIS — R109 Unspecified abdominal pain: Secondary | ICD-10-CM | POA: Diagnosis not present

## 2021-04-21 DIAGNOSIS — K5730 Diverticulosis of large intestine without perforation or abscess without bleeding: Secondary | ICD-10-CM | POA: Diagnosis not present

## 2021-04-21 DIAGNOSIS — R509 Fever, unspecified: Secondary | ICD-10-CM | POA: Diagnosis not present

## 2021-04-21 DIAGNOSIS — R279 Unspecified lack of coordination: Secondary | ICD-10-CM | POA: Diagnosis not present

## 2021-04-21 DIAGNOSIS — Z7901 Long term (current) use of anticoagulants: Secondary | ICD-10-CM | POA: Insufficient documentation

## 2021-04-21 DIAGNOSIS — R1084 Generalized abdominal pain: Secondary | ICD-10-CM | POA: Diagnosis present

## 2021-04-21 DIAGNOSIS — Z743 Need for continuous supervision: Secondary | ICD-10-CM | POA: Diagnosis not present

## 2021-04-21 DIAGNOSIS — K5641 Fecal impaction: Secondary | ICD-10-CM | POA: Diagnosis not present

## 2021-04-21 DIAGNOSIS — Z85818 Personal history of malignant neoplasm of other sites of lip, oral cavity, and pharynx: Secondary | ICD-10-CM | POA: Diagnosis not present

## 2021-04-21 LAB — COMPREHENSIVE METABOLIC PANEL
ALT: 10 U/L (ref 0–44)
AST: 20 U/L (ref 15–41)
Albumin: 3.8 g/dL (ref 3.5–5.0)
Alkaline Phosphatase: 94 U/L (ref 38–126)
Anion gap: 8 (ref 5–15)
BUN: 14 mg/dL (ref 8–23)
CO2: 26 mmol/L (ref 22–32)
Calcium: 9.6 mg/dL (ref 8.9–10.3)
Chloride: 108 mmol/L (ref 98–111)
Creatinine, Ser: 0.94 mg/dL (ref 0.44–1.00)
GFR, Estimated: >60 mL/min (ref >60)
Glucose, Bld: 121 mg/dL — ABNORMAL HIGH (ref 70–99)
Potassium: 4.3 mmol/L (ref 3.5–5.1)
Sodium: 142 mmol/L (ref 135–145)
Total Bilirubin: 0.5 mg/dL (ref 0.3–1.2)
Total Protein: 7.2 g/dL (ref 6.5–8.1)

## 2021-04-21 LAB — CBC WITH DIFFERENTIAL/PLATELET
Abs Immature Granulocytes: 0.03 10*3/uL (ref 0.00–0.07)
Basophils Absolute: 0 10*3/uL (ref 0.0–0.1)
Basophils Relative: 0 %
Eosinophils Absolute: 0 10*3/uL (ref 0.0–0.5)
Eosinophils Relative: 0 %
HCT: 39.4 % (ref 36.0–46.0)
Hemoglobin: 12.5 g/dL (ref 12.0–15.0)
Immature Granulocytes: 0 %
Lymphocytes Relative: 25 %
Lymphs Abs: 1.9 10*3/uL (ref 0.7–4.0)
MCH: 29.3 pg (ref 26.0–34.0)
MCHC: 31.7 g/dL (ref 30.0–36.0)
MCV: 92.3 fL (ref 80.0–100.0)
Monocytes Absolute: 0.4 10*3/uL (ref 0.1–1.0)
Monocytes Relative: 6 %
Neutro Abs: 5.1 10*3/uL (ref 1.7–7.7)
Neutrophils Relative %: 69 %
Platelets: 167 10*3/uL (ref 150–400)
RBC: 4.27 MIL/uL (ref 3.87–5.11)
RDW: 12 % (ref 11.5–15.5)
WBC: 7.5 10*3/uL (ref 4.0–10.5)
nRBC: 0 % (ref 0.0–0.2)

## 2021-04-21 LAB — LIPASE, BLOOD: Lipase: 22 U/L (ref 11–51)

## 2021-04-21 LAB — MAGNESIUM: Magnesium: 2.5 mg/dL — ABNORMAL HIGH (ref 1.7–2.4)

## 2021-04-21 MED ORDER — FLEET ENEMA 7-19 GM/118ML RE ENEM
1.0000 | ENEMA | Freq: Once | RECTAL | Status: AC
  Administered 2021-04-21: 1 via RECTAL
  Filled 2021-04-21: qty 1

## 2021-04-21 NOTE — Discharge Instructions (Addendum)
Please continue to give Katelyn [name] once per day for her constipation.  If she is persistently constipated you can briefly increase this to 2 times per day until her symptoms improve.  Please follow-up with her regular doctor regarding her visit today.  If she develops any new or worsening symptoms please bring her back to the emergency department.

## 2021-04-21 NOTE — ED Notes (Signed)
PTAR set up.

## 2021-04-21 NOTE — ED Triage Notes (Signed)
BIB [facility] with c/o constipation and abd pain. Recently been taking oxy for ankle pain. Also hypertensive with EMS, 200s SBP. Reparably non-compliant with medications.    Arrives on 6L trach collar. Hx of CHF, throat cancer, & HTN.

## 2021-04-21 NOTE — ED Notes (Addendum)
Did not receive report on this pt, as she was to be moved to [place] zone. However, [facility] arrived before pt could be moved. Pt is in no apparent distress, daughter at bedside. [facility] to transport pt home.

## 2021-04-21 NOTE — ED Provider Notes (Signed)
[facility] EMERGENCY DEPARTMENT Provider Note   CSN: [id number] Arrival date & time: 04/21/21  0215     History  Chief Complaint  Patient presents with   Abdominal Pain    Katelyn [name] is a 71 y.o. female.  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  Due to patient's trach she has difficulty providing a clear history.  Her daughter provides most of the history.  She states that patient has not had a normal bowel movement for nearly 2 months.  She has been giving her Dulcolax, Ex-Lax, and Colace without relief.  She recently began complaining of abdominal pain due to her constipation.  Patient denies passing any gas.  Denies any nausea, vomiting, chest pain, shortness of breath, urinary complaints.    Home Medications Prior to Admission medications   Medication Sig Start Date End Date Taking? Authorizing Provider  albuterol (PROVENTIL) (2.5 MG/3ML) 0.083% nebulizer solution Take 3 mLs (2.5 mg total) by nebulization every 6 (six) hours as needed for wheezing or shortness of breath. 02/03/20   Freddi Starr, MD  ALPRAZolam Duanne Moron) 0.5 MG tablet Take 1 tablet (0.5 mg total) by mouth 3 (three) times daily as needed for anxiety. 11/16/19   Little Ishikawa, MD  apixaban (ELIQUIS) 5 MG TABS tablet Take 1 tablet (5 mg total) by mouth 2 (two) times daily. 11/04/19   Love, Ivan Anchors, PA-C  atorvastatin (LIPITOR) 80 MG tablet Take 1 tablet (80 mg total) by mouth daily. 10/18/19   Swayze, Ava, DO  furosemide (LASIX) 20 MG tablet Take 1 tablet (20 mg total) by mouth daily for 3 days. 02/27/20 03/01/20  Deno Etienne, DO  guaiFENesin-dextromethorphan (ROBITUSSIN DM) 100-10 MG/5ML syrup Take 10 mLs by mouth every 6 (six) hours. 10/18/19   Swayze, Ava, DO  melatonin 3 MG TABS  tablet Take 1 tablet (3 mg total) by mouth at bedtime as needed (insomnia). 11/04/19   Love, Ivan Anchors, PA-C  metoprolol tartrate (LOPRESSOR) 25 MG tablet Take 1 tablet (25 mg total) by mouth 2 (two) times daily. 11/04/19   Love, Ivan Anchors, PA-C  Multiple Vitamins-Minerals (MULTIVITAMIN WITH MINERALS) tablet Take 1 tablet by mouth daily.    [provider]  nystatin cream (MYCOSTATIN) Apply 1 application topically 2 (two) times daily. Patient not taking: Reported on 02/08/2020 11/04/19   Love, Ivan Anchors, PA-C  polycarbophil (FIBERCON) 625 MG tablet Take 1 tablet (625 mg total) by mouth daily. Patient not taking: Reported on 02/08/2020 11/05/19   Love, Ivan Anchors, PA-C  traMADol (ULTRAM) 50 MG tablet Take 1 tablet (50 mg total) by mouth every 6 (six) hours as needed. 03/06/21   Luna Fuse, MD      Allergies    Patient has no known allergies.    Review of Systems   Review of Systems  All other systems reviewed and are negative. Ten systems reviewed and are negative for acute change, except as noted in the HPI.   Physical Exam Updated Vital Signs BP 102/66    Pulse 96    Temp 98.5 F (36.9 C) (Oral)    Resp 18    Ht 5\' 3"  (1.6 m)    Wt 63.4 kg    SpO2 100%    BMI 24.76 kg/m  Physical Exam Vitals and nursing note reviewed.  Constitutional:      General: She is not in acute distress.    Appearance: Normal appearance. She is well-developed. She is not ill-appearing, toxic-appearing or diaphoretic.  HENT:     Head: Normocephalic and atraumatic.     Right Ear: External ear normal.     Left Ear: External ear normal.     Nose: Nose normal.     Mouth/Throat:     Mouth: Mucous membranes are moist.     Pharynx: Oropharynx is clear. No oropharyngeal exudate or posterior oropharyngeal erythema.  Eyes:     Extraocular Movements: Extraocular movements intact.  Cardiovascular:     Rate and Rhythm: Normal rate and regular rhythm.     Pulses: Normal pulses.     Heart sounds: Normal heart  sounds. No murmur heard.   No friction rub. No gallop.  Pulmonary:     Effort: Pulmonary effort is normal. No respiratory distress.     Breath sounds: Normal breath sounds. No stridor. No wheezing, rhonchi or rales.     Comments: On 6 L via trach collar.  Lungs are clear to auscultation bilaterally. Abdominal:     General: Abdomen is flat and protuberant. There is distension.     Palpations: Abdomen is soft.     Tenderness: There is generalized abdominal tenderness.     Comments: Protuberant mildly distended abdomen that is soft.  Mild tenderness noted diffusely across the abdomen that appears to be worst in the right lower quadrant.  Musculoskeletal:        General: Normal range of motion.     Cervical back: Normal range of motion and neck supple. No tenderness.  Skin:    General: Skin is warm and dry.  Neurological:     General: No focal deficit present.     Mental Status: She is alert and oriented to person, place, and time.  Psychiatric:        Mood and Affect: Mood normal.        Behavior: Behavior normal.   ED Results / Procedures / Treatments   Labs (all labs ordered are listed, but only abnormal results are displayed) Labs Reviewed  COMPREHENSIVE METABOLIC PANEL - Abnormal; Notable for the following components:      Result Value   Glucose, Bld 121 (*)    All other components within normal limits  MAGNESIUM - Abnormal; Notable for the following components:   Magnesium 2.5 (*)    All other components within normal limits  CBC WITH DIFFERENTIAL/PLATELET  LIPASE, BLOOD    EKG EKG Interpretation  Date/Time:  Sunday April 21 2021 02:24:19 EST Ventricular Rate:  91 PR Interval:  142 QRS Duration: 75 QT Interval:  375 QTC Calculation: 462 R Axis:   -3 Text Interpretation: Sinus rhythm LVH with secondary repolarization abnormality Confirmed by Merrily Pew [phone]) on 04/21/2021 2:25:22 AM  Radiology CT ABDOMEN PELVIS WO CONTRAST  Result Date: 04/21/2021 CLINICAL  DATA:  Constipation, unspecified abdominal pain EXAM: CT ABDOMEN AND PELVIS WITHOUT CONTRAST TECHNIQUE: Multidetector CT imaging of the abdomen and pelvis was performed following the standard protocol without IV contrast. RADIATION DOSE REDUCTION: This exam was performed according to the departmental dose-optimization program which includes automated exposure control, adjustment of the mA and/or kV according to patient size and/or use of iterative reconstruction technique. COMPARISON:  01/09/2014 FINDINGS: Lower chest: No acute abnormality.  Small hiatal hernia Hepatobiliary: No focal liver abnormality is seen. No gallstones, gallbladder wall thickening, or biliary dilatation. Pancreas: Atrophic but otherwise unremarkable  Spleen: Unremarkable Adrenals/Urinary Tract: Adrenal glands are unremarkable. Kidneys are normal, without renal calculi, focal lesion, or hydronephrosis. Bladder is unremarkable. Stomach/Bowel: There is large volume stool within the rectal vault suggesting changes of fecal impaction. Severe sigmoid diverticulosis without evidence of superimposed acute inflammatory change. Sigmoid colon is mildly redundant. The stomach, small bowel, and large bowel are otherwise unremarkable. The appendix is absent. No free intraperitoneal gas or fluid. Vascular/Lymphatic: Aortic atherosclerosis. No enlarged abdominal or pelvic lymph nodes. Reproductive: Status post hysterectomy. No adnexal masses. Other: No abdominal wall hernia. Musculoskeletal: Right femoral neck ORIF has been performed. No acute bone abnormality. No lytic or blastic bone lesion. IMPRESSION: Large volume stool within the rectal vault suggesting changes of fecal impaction. Severe sigmoid diverticulosis without superimposed acute inflammatory change. Aortic Atherosclerosis (ICD10-I70.0). Electronically Signed   By: Fidela Salisbury M.D.   On: 04/21/2021 03:43    Procedures Procedures   Medications Ordered in ED Medications  sodium phosphate  (FLEET) 7-19 GM/118ML enema 1 enema (1 enema Rectal Given 04/21/21 0433)    ED Course/ Medical Decision Making/ A&P Clinical Course as of 04/21/21 0603  Sun Apr 21, 2021  0508 CT ABDOMEN PELVIS WO CONTRAST CT scan concerning for fecal impaction.  Patient did not tolerate a fleets enema so she was manually disimpacted producing a large baseball sized hard stool.  After this was removed patient with significant amount of loose stool. [LJ]    Clinical Course User Index [LJ] Rayna Sexton, PA-C                           Medical Decision Making Amount and/or Complexity of Data Reviewed Labs: ordered. Radiology: ordered. Decision-making details documented in ED Course.  Risk OTC drugs.  Pt is a 71 y.o. female who presents to the emergency department due to abdominal pain as well as constipation.  Labs: CBC without abnormalities. Lipase of 22. CMP with a glucose of 121. Magnesium mildly elevated at 2.5.  Imaging: CT scan of the abdomen/pelvis without contrast shows  IMPRESSION: Large volume stool within the rectal vault suggesting changes of fecal impaction. Severe sigmoid diverticulosis without superimposed acute inflammatory change. Aortic Atherosclerosis (ICD10-I70.0). Electronically Signed   By: Fidela Salisbury M.D.   On: 04/21/2021 03:43    I, Rayna Sexton, PA-C, personally reviewed and evaluated these images and lab results as part of my medical decision-making.  Patient with a soft but mildly distended abdomen.  Diffuse abdominal tenderness that is worst in the right lower quadrant.  Patient's daughter reports patient has been significantly constipated.  I obtained a CT scan without contrast with findings as noted above that is concerning for changes of fecal impaction.  Initially attempted a fleets enema but patient could not tolerate this so patient was manually disimpacted producing a baseball sized hard stool.  After this was removed patient produced a large amount of soft  stool.  Patient notes significant improvement in her symptoms.  Patient afebrile.  Nontoxic-appearing.  CBC without leukocytosis.  Lipase within normal limits at 22.  No electrolyte derangements noted on CMP, though patient's magnesium is mildly elevated at 2.5.    Feel the patient is stable for discharge at this time and she is agreeable.  Discussed with her daughter that patient needs to continue with MiraLAX.  Discussed her pain medications are likely exacerbating her constipation.  Discussed return precautions.  Patient and her daughter's questions were answered and they were amicable at the time of  discharge.  Note: Portions of this report may have been transcribed using voice recognition software. Every effort was made to ensure accuracy; however, inadvertent computerized transcription errors may be present.   Final Clinical Impression(s) / ED Diagnoses Final diagnoses:  Fecal impaction ([place])  Constipation, unspecified constipation type   Rx / DC Orders ED Discharge Orders     None         Rayna Sexton, PA-C 04/21/21 0606    Mesner, Corene Cornea, MD 04/21/21 [phone]

## 2021-04-21 NOTE — ED Notes (Addendum)
This RN and another RN attempted to administer full enema, pt kept grabbing rn's hand and turning over. RN attempted to educate patient on necessity for treatment but pt refused. PA Joldersma aware

## 2021-04-21 NOTE — ED Notes (Signed)
Pt had been left dirty after enema, per daughter. Pt cleaned and changed before d/c.

## 2021-04-23 DIAGNOSIS — R531 Weakness: Secondary | ICD-10-CM | POA: Diagnosis not present

## 2021-04-23 DIAGNOSIS — Z743 Need for continuous supervision: Secondary | ICD-10-CM | POA: Diagnosis not present

## 2021-04-23 DIAGNOSIS — S8292XD Unspecified fracture of left lower leg, subsequent encounter for closed fracture with routine healing: Secondary | ICD-10-CM | POA: Diagnosis not present

## 2021-04-24 DIAGNOSIS — Z8781 Personal history of (healed) traumatic fracture: Secondary | ICD-10-CM | POA: Diagnosis not present

## 2021-04-24 DIAGNOSIS — Z7901 Long term (current) use of anticoagulants: Secondary | ICD-10-CM | POA: Diagnosis not present

## 2021-04-24 DIAGNOSIS — R5381 Other malaise: Secondary | ICD-10-CM | POA: Diagnosis not present

## 2021-04-24 DIAGNOSIS — E7800 Pure hypercholesterolemia, unspecified: Secondary | ICD-10-CM | POA: Diagnosis not present

## 2021-04-24 DIAGNOSIS — Z9981 Dependence on supplemental oxygen: Secondary | ICD-10-CM | POA: Diagnosis not present

## 2021-04-24 DIAGNOSIS — D631 Anemia in chronic kidney disease: Secondary | ICD-10-CM | POA: Diagnosis not present

## 2021-04-24 DIAGNOSIS — N1832 Chronic kidney disease, stage 3b: Secondary | ICD-10-CM | POA: Diagnosis not present

## 2021-04-24 DIAGNOSIS — J9622 Acute and chronic respiratory failure with hypercapnia: Secondary | ICD-10-CM | POA: Diagnosis not present

## 2021-04-24 DIAGNOSIS — E440 Moderate protein-calorie malnutrition: Secondary | ICD-10-CM | POA: Diagnosis not present

## 2021-04-24 DIAGNOSIS — J449 Chronic obstructive pulmonary disease, unspecified: Secondary | ICD-10-CM | POA: Diagnosis not present

## 2021-04-24 DIAGNOSIS — L89321 Pressure ulcer of left buttock, stage 1: Secondary | ICD-10-CM | POA: Diagnosis not present

## 2021-04-24 DIAGNOSIS — I129 Hypertensive chronic kidney disease with stage 1 through stage 4 chronic kidney disease, or unspecified chronic kidney disease: Secondary | ICD-10-CM | POA: Diagnosis not present

## 2021-04-24 DIAGNOSIS — F32A Depression, unspecified: Secondary | ICD-10-CM | POA: Diagnosis not present

## 2021-04-24 DIAGNOSIS — Z86718 Personal history of other venous thrombosis and embolism: Secondary | ICD-10-CM | POA: Diagnosis not present

## 2021-04-24 DIAGNOSIS — L89311 Pressure ulcer of right buttock, stage 1: Secondary | ICD-10-CM | POA: Diagnosis not present

## 2021-04-24 DIAGNOSIS — G609 Hereditary and idiopathic neuropathy, unspecified: Secondary | ICD-10-CM | POA: Diagnosis not present

## 2021-04-24 DIAGNOSIS — R270 Ataxia, unspecified: Secondary | ICD-10-CM | POA: Diagnosis not present

## 2021-04-24 DIAGNOSIS — I69398 Other sequelae of cerebral infarction: Secondary | ICD-10-CM | POA: Diagnosis not present

## 2021-04-24 DIAGNOSIS — Z85818 Personal history of malignant neoplasm of other sites of lip, oral cavity, and pharynx: Secondary | ICD-10-CM | POA: Diagnosis not present

## 2021-04-24 DIAGNOSIS — J9621 Acute and chronic respiratory failure with hypoxia: Secondary | ICD-10-CM | POA: Diagnosis not present

## 2021-04-24 DIAGNOSIS — Z930 Tracheostomy status: Secondary | ICD-10-CM | POA: Diagnosis not present

## 2021-04-24 DIAGNOSIS — S82842D Displaced bimalleolar fracture of left lower leg, subsequent encounter for closed fracture with routine healing: Secondary | ICD-10-CM | POA: Diagnosis not present

## 2021-04-27 DIAGNOSIS — G609 Hereditary and idiopathic neuropathy, unspecified: Secondary | ICD-10-CM | POA: Diagnosis not present

## 2021-04-27 DIAGNOSIS — D631 Anemia in chronic kidney disease: Secondary | ICD-10-CM | POA: Diagnosis not present

## 2021-04-27 DIAGNOSIS — S82842D Displaced bimalleolar fracture of left lower leg, subsequent encounter for closed fracture with routine healing: Secondary | ICD-10-CM | POA: Diagnosis not present

## 2021-04-27 DIAGNOSIS — J9621 Acute and chronic respiratory failure with hypoxia: Secondary | ICD-10-CM | POA: Diagnosis not present

## 2021-04-27 DIAGNOSIS — F32A Depression, unspecified: Secondary | ICD-10-CM | POA: Diagnosis not present

## 2021-04-27 DIAGNOSIS — L89311 Pressure ulcer of right buttock, stage 1: Secondary | ICD-10-CM | POA: Diagnosis not present

## 2021-04-27 DIAGNOSIS — I129 Hypertensive chronic kidney disease with stage 1 through stage 4 chronic kidney disease, or unspecified chronic kidney disease: Secondary | ICD-10-CM | POA: Diagnosis not present

## 2021-04-27 DIAGNOSIS — N1832 Chronic kidney disease, stage 3b: Secondary | ICD-10-CM | POA: Diagnosis not present

## 2021-04-27 DIAGNOSIS — R5381 Other malaise: Secondary | ICD-10-CM | POA: Diagnosis not present

## 2021-04-27 DIAGNOSIS — L89321 Pressure ulcer of left buttock, stage 1: Secondary | ICD-10-CM | POA: Diagnosis not present

## 2021-04-27 DIAGNOSIS — I69398 Other sequelae of cerebral infarction: Secondary | ICD-10-CM | POA: Diagnosis not present

## 2021-04-27 DIAGNOSIS — E440 Moderate protein-calorie malnutrition: Secondary | ICD-10-CM | POA: Diagnosis not present

## 2021-04-27 DIAGNOSIS — J449 Chronic obstructive pulmonary disease, unspecified: Secondary | ICD-10-CM | POA: Diagnosis not present

## 2021-04-27 DIAGNOSIS — E7800 Pure hypercholesterolemia, unspecified: Secondary | ICD-10-CM | POA: Diagnosis not present

## 2021-04-27 DIAGNOSIS — R270 Ataxia, unspecified: Secondary | ICD-10-CM | POA: Diagnosis not present

## 2021-04-27 DIAGNOSIS — J9622 Acute and chronic respiratory failure with hypercapnia: Secondary | ICD-10-CM | POA: Diagnosis not present

## 2021-04-29 DIAGNOSIS — J449 Chronic obstructive pulmonary disease, unspecified: Secondary | ICD-10-CM | POA: Diagnosis not present

## 2021-04-29 DIAGNOSIS — I69398 Other sequelae of cerebral infarction: Secondary | ICD-10-CM | POA: Diagnosis not present

## 2021-04-29 DIAGNOSIS — F32A Depression, unspecified: Secondary | ICD-10-CM | POA: Diagnosis not present

## 2021-04-29 DIAGNOSIS — J9621 Acute and chronic respiratory failure with hypoxia: Secondary | ICD-10-CM | POA: Diagnosis not present

## 2021-04-29 DIAGNOSIS — N1832 Chronic kidney disease, stage 3b: Secondary | ICD-10-CM | POA: Diagnosis not present

## 2021-04-29 DIAGNOSIS — D631 Anemia in chronic kidney disease: Secondary | ICD-10-CM | POA: Diagnosis not present

## 2021-04-29 DIAGNOSIS — J9622 Acute and chronic respiratory failure with hypercapnia: Secondary | ICD-10-CM | POA: Diagnosis not present

## 2021-04-29 DIAGNOSIS — R5381 Other malaise: Secondary | ICD-10-CM | POA: Diagnosis not present

## 2021-04-29 DIAGNOSIS — S82842D Displaced bimalleolar fracture of left lower leg, subsequent encounter for closed fracture with routine healing: Secondary | ICD-10-CM | POA: Diagnosis not present

## 2021-04-29 DIAGNOSIS — E440 Moderate protein-calorie malnutrition: Secondary | ICD-10-CM | POA: Diagnosis not present

## 2021-04-29 DIAGNOSIS — E7800 Pure hypercholesterolemia, unspecified: Secondary | ICD-10-CM | POA: Diagnosis not present

## 2021-04-29 DIAGNOSIS — L89311 Pressure ulcer of right buttock, stage 1: Secondary | ICD-10-CM | POA: Diagnosis not present

## 2021-04-29 DIAGNOSIS — G609 Hereditary and idiopathic neuropathy, unspecified: Secondary | ICD-10-CM | POA: Diagnosis not present

## 2021-04-29 DIAGNOSIS — I129 Hypertensive chronic kidney disease with stage 1 through stage 4 chronic kidney disease, or unspecified chronic kidney disease: Secondary | ICD-10-CM | POA: Diagnosis not present

## 2021-04-29 DIAGNOSIS — L89321 Pressure ulcer of left buttock, stage 1: Secondary | ICD-10-CM | POA: Diagnosis not present

## 2021-04-29 DIAGNOSIS — R270 Ataxia, unspecified: Secondary | ICD-10-CM | POA: Diagnosis not present

## 2021-05-01 NOTE — Progress Notes (Signed)
[place] PALLIATIVE CARE RN NOTE  PATIENT NAME: Perl Folmar DOB: September 20, 1950 MRN: [id number]  PRIMARY CARE PROVIDER: Aretta Nip, MD  RESPONSIBLE PARTY: [name] (daughter) Acct ID - Guarantor Home Phone Work Phone Relationship Acct Type  [id number] KASEN, [phone]  Self P/F     [address] [place], [place] [address]   Due to the COVID-19 crisis, this virtual check-in visit was done via telephone from my office and it was initiated and consent by this patient and or family.  RN telephonic encounter completed with patient's daughter-[name]. Patient had an appointment at [facility] due to her left ankle fracture and placed a boot on her leg. She has another follow up appointment there on 03/26/21. Daughter states that the only medication patient will take is her pain medication. She cannot get patient to take her Eliquis or blood pressure medications. She is requiring increased assistance with all personal care. Her appetite is poor at this time. Daughter has reached out to [employer] for personal care assistance but had to re-schedule because of an illness. Daughter will keep palliative care updated on patient condition. Palliative team will continue to follow.  (Duration of visit and documentation 15 minutes)   Daryl Eastern, RN BSN

## 2021-05-02 DIAGNOSIS — R5381 Other malaise: Secondary | ICD-10-CM | POA: Diagnosis not present

## 2021-05-02 DIAGNOSIS — D631 Anemia in chronic kidney disease: Secondary | ICD-10-CM | POA: Diagnosis not present

## 2021-05-02 DIAGNOSIS — R270 Ataxia, unspecified: Secondary | ICD-10-CM | POA: Diagnosis not present

## 2021-05-02 DIAGNOSIS — I69398 Other sequelae of cerebral infarction: Secondary | ICD-10-CM | POA: Diagnosis not present

## 2021-05-02 DIAGNOSIS — I129 Hypertensive chronic kidney disease with stage 1 through stage 4 chronic kidney disease, or unspecified chronic kidney disease: Secondary | ICD-10-CM | POA: Diagnosis not present

## 2021-05-02 DIAGNOSIS — E440 Moderate protein-calorie malnutrition: Secondary | ICD-10-CM | POA: Diagnosis not present

## 2021-05-02 DIAGNOSIS — J9621 Acute and chronic respiratory failure with hypoxia: Secondary | ICD-10-CM | POA: Diagnosis not present

## 2021-05-02 DIAGNOSIS — E7800 Pure hypercholesterolemia, unspecified: Secondary | ICD-10-CM | POA: Diagnosis not present

## 2021-05-02 DIAGNOSIS — S82842D Displaced bimalleolar fracture of left lower leg, subsequent encounter for closed fracture with routine healing: Secondary | ICD-10-CM | POA: Diagnosis not present

## 2021-05-02 DIAGNOSIS — J449 Chronic obstructive pulmonary disease, unspecified: Secondary | ICD-10-CM | POA: Diagnosis not present

## 2021-05-02 DIAGNOSIS — N1832 Chronic kidney disease, stage 3b: Secondary | ICD-10-CM | POA: Diagnosis not present

## 2021-05-02 DIAGNOSIS — G609 Hereditary and idiopathic neuropathy, unspecified: Secondary | ICD-10-CM | POA: Diagnosis not present

## 2021-05-02 DIAGNOSIS — J9622 Acute and chronic respiratory failure with hypercapnia: Secondary | ICD-10-CM | POA: Diagnosis not present

## 2021-05-02 DIAGNOSIS — L89321 Pressure ulcer of left buttock, stage 1: Secondary | ICD-10-CM | POA: Diagnosis not present

## 2021-05-02 DIAGNOSIS — L89311 Pressure ulcer of right buttock, stage 1: Secondary | ICD-10-CM | POA: Diagnosis not present

## 2021-05-02 DIAGNOSIS — F32A Depression, unspecified: Secondary | ICD-10-CM | POA: Diagnosis not present

## 2021-05-06 DIAGNOSIS — I69398 Other sequelae of cerebral infarction: Secondary | ICD-10-CM | POA: Diagnosis not present

## 2021-05-06 DIAGNOSIS — L89311 Pressure ulcer of right buttock, stage 1: Secondary | ICD-10-CM | POA: Diagnosis not present

## 2021-05-06 DIAGNOSIS — R270 Ataxia, unspecified: Secondary | ICD-10-CM | POA: Diagnosis not present

## 2021-05-06 DIAGNOSIS — J9621 Acute and chronic respiratory failure with hypoxia: Secondary | ICD-10-CM | POA: Diagnosis not present

## 2021-05-06 DIAGNOSIS — I129 Hypertensive chronic kidney disease with stage 1 through stage 4 chronic kidney disease, or unspecified chronic kidney disease: Secondary | ICD-10-CM | POA: Diagnosis not present

## 2021-05-06 DIAGNOSIS — D631 Anemia in chronic kidney disease: Secondary | ICD-10-CM | POA: Diagnosis not present

## 2021-05-06 DIAGNOSIS — R5381 Other malaise: Secondary | ICD-10-CM | POA: Diagnosis not present

## 2021-05-06 DIAGNOSIS — L89321 Pressure ulcer of left buttock, stage 1: Secondary | ICD-10-CM | POA: Diagnosis not present

## 2021-05-06 DIAGNOSIS — G609 Hereditary and idiopathic neuropathy, unspecified: Secondary | ICD-10-CM | POA: Diagnosis not present

## 2021-05-06 DIAGNOSIS — E440 Moderate protein-calorie malnutrition: Secondary | ICD-10-CM | POA: Diagnosis not present

## 2021-05-06 DIAGNOSIS — N1832 Chronic kidney disease, stage 3b: Secondary | ICD-10-CM | POA: Diagnosis not present

## 2021-05-06 DIAGNOSIS — E7800 Pure hypercholesterolemia, unspecified: Secondary | ICD-10-CM | POA: Diagnosis not present

## 2021-05-06 DIAGNOSIS — S82842D Displaced bimalleolar fracture of left lower leg, subsequent encounter for closed fracture with routine healing: Secondary | ICD-10-CM | POA: Diagnosis not present

## 2021-05-06 DIAGNOSIS — F32A Depression, unspecified: Secondary | ICD-10-CM | POA: Diagnosis not present

## 2021-05-06 DIAGNOSIS — J449 Chronic obstructive pulmonary disease, unspecified: Secondary | ICD-10-CM | POA: Diagnosis not present

## 2021-05-06 DIAGNOSIS — J9622 Acute and chronic respiratory failure with hypercapnia: Secondary | ICD-10-CM | POA: Diagnosis not present

## 2021-05-07 DIAGNOSIS — R5381 Other malaise: Secondary | ICD-10-CM | POA: Diagnosis not present

## 2021-05-07 DIAGNOSIS — G609 Hereditary and idiopathic neuropathy, unspecified: Secondary | ICD-10-CM | POA: Diagnosis not present

## 2021-05-07 DIAGNOSIS — J449 Chronic obstructive pulmonary disease, unspecified: Secondary | ICD-10-CM | POA: Diagnosis not present

## 2021-05-07 DIAGNOSIS — J9622 Acute and chronic respiratory failure with hypercapnia: Secondary | ICD-10-CM | POA: Diagnosis not present

## 2021-05-07 DIAGNOSIS — F32A Depression, unspecified: Secondary | ICD-10-CM | POA: Diagnosis not present

## 2021-05-07 DIAGNOSIS — D631 Anemia in chronic kidney disease: Secondary | ICD-10-CM | POA: Diagnosis not present

## 2021-05-07 DIAGNOSIS — J9621 Acute and chronic respiratory failure with hypoxia: Secondary | ICD-10-CM | POA: Diagnosis not present

## 2021-05-07 DIAGNOSIS — E7800 Pure hypercholesterolemia, unspecified: Secondary | ICD-10-CM | POA: Diagnosis not present

## 2021-05-07 DIAGNOSIS — I69398 Other sequelae of cerebral infarction: Secondary | ICD-10-CM | POA: Diagnosis not present

## 2021-05-07 DIAGNOSIS — N1832 Chronic kidney disease, stage 3b: Secondary | ICD-10-CM | POA: Diagnosis not present

## 2021-05-07 DIAGNOSIS — S82842D Displaced bimalleolar fracture of left lower leg, subsequent encounter for closed fracture with routine healing: Secondary | ICD-10-CM | POA: Diagnosis not present

## 2021-05-07 DIAGNOSIS — I129 Hypertensive chronic kidney disease with stage 1 through stage 4 chronic kidney disease, or unspecified chronic kidney disease: Secondary | ICD-10-CM | POA: Diagnosis not present

## 2021-05-07 DIAGNOSIS — E440 Moderate protein-calorie malnutrition: Secondary | ICD-10-CM | POA: Diagnosis not present

## 2021-05-07 DIAGNOSIS — L89311 Pressure ulcer of right buttock, stage 1: Secondary | ICD-10-CM | POA: Diagnosis not present

## 2021-05-07 DIAGNOSIS — R270 Ataxia, unspecified: Secondary | ICD-10-CM | POA: Diagnosis not present

## 2021-05-07 DIAGNOSIS — L89321 Pressure ulcer of left buttock, stage 1: Secondary | ICD-10-CM | POA: Diagnosis not present

## 2021-05-07 DIAGNOSIS — Z515 Encounter for palliative care: Secondary | ICD-10-CM

## 2021-05-07 NOTE — Progress Notes (Signed)
PC SW completed a Personal Care Service [facility]) application for patient and emailed it to Dr. Zadie Rhine for her review and signature. SW to forward the application to [place] for processing upon return.  ?

## 2021-05-08 ENCOUNTER — Other Ambulatory Visit: Payer: Medicare Other | Admitting: *Deleted

## 2021-05-08 ENCOUNTER — Other Ambulatory Visit: Payer: Self-pay

## 2021-05-08 VITALS — BP 162/98 | HR 89 | Temp 97.6°F | Resp 18

## 2021-05-09 NOTE — Progress Notes (Signed)
[place] PALLIATIVE CARE RN NOTE ? ?PATIENT NAME: Katelyn [name] ?DOB: 1950/02/25 ?MRN: [id number] ? ?PRIMARY CARE PROVIDER: Aretta Nip, MD ? ?RESPONSIBLE PARTY: [name] (daughter) ?Acct ID - Guarantor Home Phone Work Phone Relationship Acct Type  ?[id number] LOAN, [id number] [phone]  Self P/F  ?   [address], [place], [place] [address]  ? ?Covid-19 Pre-screening Negative ? ?Pain: Patient denies pain today. She does experiencing pain in her left ankle at times with movement as a result of a fracture she sustained 2 months ago after a mechanical fall. She has Tylenol and Tramadol available as needed. She also reports having pain in her left neck this morning. Denies pain at this moment. She has seen Dr. Owens Shark for this in the past who says it appears to be scar tissue from a past surgery. Patient to let us know if this pain worsens. ? ?Cardiovascular: BP 162/98 today. Although elevated, this is better than readings I have obtained in the past. Patient reports taking her blood pressure medications about 2 hours ago. Denies chest pain, dizziness or headache.   ? ?Respiratory: She reports some mild shortness of breath at times at rest. None noted today. She wears oxygen at 3L/min via trach collar during the night, but is not consistent with this. ? ?Constipation: She was seen in the ED on 04/21/21 for a fecal impaction which caused abdominal pain. She did not tolerate Fleets enema so was disimpacted which produced a large amount of soft stool. She is to continue Miralax daily. LBM 05/06/21. ? ?Mobility: She started PT with [facility] home [facility] on 04/27/21 2x/week. She was able to ambulate with PT from her kitchen to the front door with 1 person assistance and gait belt. She is afraid of falling so will not attempt this with family. Tammy says her ankle is healing. She has a follow up appointment with Ortho on 05/21/21 and hopes to get the boot off. ? ?Appetite: Her intake is steadily  improving. She is eating regular food. She takes her medications crushed in applesauce. She now has Medicaid so is hoping to get some dentures soon. ? ?Since patient now has Medicaid, daughter had inquired about receiving an in home aid. Our palliative SW, M.Lonon has faxed a PCS application to Dr. [name] office yesterday and we are awaiting a signature to proceed. Palliative care will continue to follow.  ? ?CODE STATUS: Full code  ?ADVANCED DIRECTIVES: Y ?MOST FORM: no ?PPS: 30% ? ? ?PHYSICAL EXAM:  ? ?VITALS: ?Today's Vitals  ? 05/08/21 1145  ?BP: (!) 162/98  ?Pulse: 89  ?Resp: 18  ?Temp: 97.6 ?F (36.4 ?C)  ?TempSrc: Temporal  ?SpO2: 97%  ?PainSc: 0-No pain  ?  ?LUNGS: clear to auscultation  ?CARDIAC: Cor RRR ?EXTREMITIES: No edema ?SKIN:  Patient reports sores on her bottom, one on each side. RN providing wound care through [facility] who gave patient 2 types of barrier creams to apply. Patient says they are healing   ?NEURO:  Alert and oriented x 3, pleasant mood, requires 1 person assistance with standing and transfers. Will only ambulate with therapy ? ? ?(Duration of visit and documentation 75 minutes) ? ? ? ?Daryl Eastern, RN BSN ? ?

## 2021-05-10 ENCOUNTER — Other Ambulatory Visit: Payer: Medicare Other

## 2021-05-10 ENCOUNTER — Other Ambulatory Visit: Payer: Medicare Other | Admitting: *Deleted

## 2021-05-10 ENCOUNTER — Other Ambulatory Visit: Payer: Self-pay

## 2021-05-10 DIAGNOSIS — L89321 Pressure ulcer of left buttock, stage 1: Secondary | ICD-10-CM | POA: Diagnosis not present

## 2021-05-10 DIAGNOSIS — Z515 Encounter for palliative care: Secondary | ICD-10-CM

## 2021-05-10 DIAGNOSIS — G609 Hereditary and idiopathic neuropathy, unspecified: Secondary | ICD-10-CM | POA: Diagnosis not present

## 2021-05-10 DIAGNOSIS — E7800 Pure hypercholesterolemia, unspecified: Secondary | ICD-10-CM | POA: Diagnosis not present

## 2021-05-10 DIAGNOSIS — R5381 Other malaise: Secondary | ICD-10-CM | POA: Diagnosis not present

## 2021-05-10 DIAGNOSIS — N1832 Chronic kidney disease, stage 3b: Secondary | ICD-10-CM | POA: Diagnosis not present

## 2021-05-10 DIAGNOSIS — I69398 Other sequelae of cerebral infarction: Secondary | ICD-10-CM | POA: Diagnosis not present

## 2021-05-10 DIAGNOSIS — J9622 Acute and chronic respiratory failure with hypercapnia: Secondary | ICD-10-CM | POA: Diagnosis not present

## 2021-05-10 DIAGNOSIS — I129 Hypertensive chronic kidney disease with stage 1 through stage 4 chronic kidney disease, or unspecified chronic kidney disease: Secondary | ICD-10-CM | POA: Diagnosis not present

## 2021-05-10 DIAGNOSIS — F32A Depression, unspecified: Secondary | ICD-10-CM | POA: Diagnosis not present

## 2021-05-10 DIAGNOSIS — E440 Moderate protein-calorie malnutrition: Secondary | ICD-10-CM | POA: Diagnosis not present

## 2021-05-10 DIAGNOSIS — R270 Ataxia, unspecified: Secondary | ICD-10-CM | POA: Diagnosis not present

## 2021-05-10 DIAGNOSIS — J449 Chronic obstructive pulmonary disease, unspecified: Secondary | ICD-10-CM | POA: Diagnosis not present

## 2021-05-10 DIAGNOSIS — J9621 Acute and chronic respiratory failure with hypoxia: Secondary | ICD-10-CM | POA: Diagnosis not present

## 2021-05-10 DIAGNOSIS — D631 Anemia in chronic kidney disease: Secondary | ICD-10-CM | POA: Diagnosis not present

## 2021-05-10 DIAGNOSIS — S82842D Displaced bimalleolar fracture of left lower leg, subsequent encounter for closed fracture with routine healing: Secondary | ICD-10-CM | POA: Diagnosis not present

## 2021-05-10 DIAGNOSIS — L89311 Pressure ulcer of right buttock, stage 1: Secondary | ICD-10-CM | POA: Diagnosis not present

## 2021-05-10 NOTE — Progress Notes (Signed)
[facility] PALLIATIVE CARE SW NOTE ? ?PATIENT NAME: Katelyn [name] ?DOB: [birth year] ?MRN: [id number] ? ?PRIMARY CARE PROVIDER: Aretta Nip, MD ? ?RESPONSIBLE PARTY:  ?Acct ID - Guarantor Home Phone Work Phone Relationship Acct Type  ?[id number] Katelyn [name], Katelyn [name]  Self P/F  ?   [address] [place], [place] [address]  ? ? ?PC SW and RN- M. Nadara Mustard completed a face-to-face visit with patient at her home. The team provided patient with donated incontinent supplies to her. Patient doing well today and no other concerns noted. ? ? ?Katheren Puller, LCSW ? ?

## 2021-05-13 NOTE — Progress Notes (Signed)
[place] PALLIATIVE CARE RN NOTE ? ?PATIENT NAME: Katelyn [name] ?DOB: 03/26/1950 ?MRN: [id number] ? ?PRIMARY CARE PROVIDER: Aretta Nip, MD ? ?RESPONSIBLE PARTY: [name] (daughter) ?Acct ID - Guarantor Home Phone Work Phone Relationship Acct Type  ?[id number] JEANETT, [phone]  Self P/F  ?   [address], [place], [place] [address]  ? ?Covid-19 Pre-screening Negative ? ?RN/SW visit completed with patient in her home to deliver donated briefs as requested by patient/family. Patient is sitting up in her wheelchair awake and alert. She reports feeling ok today. She is awaiting visit from her physical therapist through [facility]. She is appreciative of donation. Palliative care to continue to follow.  ? ?(Duration of visit and documentation 15 minutes) ? ? ?Daryl Eastern, RN BSN ? ?

## 2021-05-14 DIAGNOSIS — L89321 Pressure ulcer of left buttock, stage 1: Secondary | ICD-10-CM | POA: Diagnosis not present

## 2021-05-14 DIAGNOSIS — E7800 Pure hypercholesterolemia, unspecified: Secondary | ICD-10-CM | POA: Diagnosis not present

## 2021-05-14 DIAGNOSIS — I129 Hypertensive chronic kidney disease with stage 1 through stage 4 chronic kidney disease, or unspecified chronic kidney disease: Secondary | ICD-10-CM | POA: Diagnosis not present

## 2021-05-14 DIAGNOSIS — J9621 Acute and chronic respiratory failure with hypoxia: Secondary | ICD-10-CM | POA: Diagnosis not present

## 2021-05-14 DIAGNOSIS — N1832 Chronic kidney disease, stage 3b: Secondary | ICD-10-CM | POA: Diagnosis not present

## 2021-05-14 DIAGNOSIS — I69398 Other sequelae of cerebral infarction: Secondary | ICD-10-CM | POA: Diagnosis not present

## 2021-05-14 DIAGNOSIS — L89311 Pressure ulcer of right buttock, stage 1: Secondary | ICD-10-CM | POA: Diagnosis not present

## 2021-05-14 DIAGNOSIS — J9622 Acute and chronic respiratory failure with hypercapnia: Secondary | ICD-10-CM | POA: Diagnosis not present

## 2021-05-14 DIAGNOSIS — R5381 Other malaise: Secondary | ICD-10-CM | POA: Diagnosis not present

## 2021-05-14 DIAGNOSIS — J449 Chronic obstructive pulmonary disease, unspecified: Secondary | ICD-10-CM | POA: Diagnosis not present

## 2021-05-14 DIAGNOSIS — D631 Anemia in chronic kidney disease: Secondary | ICD-10-CM | POA: Diagnosis not present

## 2021-05-14 DIAGNOSIS — R270 Ataxia, unspecified: Secondary | ICD-10-CM | POA: Diagnosis not present

## 2021-05-14 DIAGNOSIS — E440 Moderate protein-calorie malnutrition: Secondary | ICD-10-CM | POA: Diagnosis not present

## 2021-05-14 DIAGNOSIS — G609 Hereditary and idiopathic neuropathy, unspecified: Secondary | ICD-10-CM | POA: Diagnosis not present

## 2021-05-14 DIAGNOSIS — S82842D Displaced bimalleolar fracture of left lower leg, subsequent encounter for closed fracture with routine healing: Secondary | ICD-10-CM | POA: Diagnosis not present

## 2021-05-14 DIAGNOSIS — F32A Depression, unspecified: Secondary | ICD-10-CM | POA: Diagnosis not present

## 2021-05-17 DIAGNOSIS — J449 Chronic obstructive pulmonary disease, unspecified: Secondary | ICD-10-CM | POA: Diagnosis not present

## 2021-05-17 DIAGNOSIS — I69398 Other sequelae of cerebral infarction: Secondary | ICD-10-CM | POA: Diagnosis not present

## 2021-05-17 DIAGNOSIS — E7800 Pure hypercholesterolemia, unspecified: Secondary | ICD-10-CM | POA: Diagnosis not present

## 2021-05-17 DIAGNOSIS — N1832 Chronic kidney disease, stage 3b: Secondary | ICD-10-CM | POA: Diagnosis not present

## 2021-05-17 DIAGNOSIS — G609 Hereditary and idiopathic neuropathy, unspecified: Secondary | ICD-10-CM | POA: Diagnosis not present

## 2021-05-17 DIAGNOSIS — J9622 Acute and chronic respiratory failure with hypercapnia: Secondary | ICD-10-CM | POA: Diagnosis not present

## 2021-05-17 DIAGNOSIS — L89321 Pressure ulcer of left buttock, stage 1: Secondary | ICD-10-CM | POA: Diagnosis not present

## 2021-05-17 DIAGNOSIS — I129 Hypertensive chronic kidney disease with stage 1 through stage 4 chronic kidney disease, or unspecified chronic kidney disease: Secondary | ICD-10-CM | POA: Diagnosis not present

## 2021-05-17 DIAGNOSIS — R270 Ataxia, unspecified: Secondary | ICD-10-CM | POA: Diagnosis not present

## 2021-05-17 DIAGNOSIS — F32A Depression, unspecified: Secondary | ICD-10-CM | POA: Diagnosis not present

## 2021-05-17 DIAGNOSIS — S82842D Displaced bimalleolar fracture of left lower leg, subsequent encounter for closed fracture with routine healing: Secondary | ICD-10-CM | POA: Diagnosis not present

## 2021-05-17 DIAGNOSIS — J9621 Acute and chronic respiratory failure with hypoxia: Secondary | ICD-10-CM | POA: Diagnosis not present

## 2021-05-17 DIAGNOSIS — L89311 Pressure ulcer of right buttock, stage 1: Secondary | ICD-10-CM | POA: Diagnosis not present

## 2021-05-17 DIAGNOSIS — D631 Anemia in chronic kidney disease: Secondary | ICD-10-CM | POA: Diagnosis not present

## 2021-05-17 DIAGNOSIS — R5381 Other malaise: Secondary | ICD-10-CM | POA: Diagnosis not present

## 2021-05-17 DIAGNOSIS — E440 Moderate protein-calorie malnutrition: Secondary | ICD-10-CM | POA: Diagnosis not present

## 2021-05-22 DIAGNOSIS — E7800 Pure hypercholesterolemia, unspecified: Secondary | ICD-10-CM | POA: Diagnosis not present

## 2021-05-22 DIAGNOSIS — R531 Weakness: Secondary | ICD-10-CM | POA: Diagnosis not present

## 2021-05-22 DIAGNOSIS — F32A Depression, unspecified: Secondary | ICD-10-CM | POA: Diagnosis not present

## 2021-05-22 DIAGNOSIS — G609 Hereditary and idiopathic neuropathy, unspecified: Secondary | ICD-10-CM | POA: Diagnosis not present

## 2021-05-22 DIAGNOSIS — L89311 Pressure ulcer of right buttock, stage 1: Secondary | ICD-10-CM | POA: Diagnosis not present

## 2021-05-22 DIAGNOSIS — J9622 Acute and chronic respiratory failure with hypercapnia: Secondary | ICD-10-CM | POA: Diagnosis not present

## 2021-05-22 DIAGNOSIS — L89321 Pressure ulcer of left buttock, stage 1: Secondary | ICD-10-CM | POA: Diagnosis not present

## 2021-05-22 DIAGNOSIS — J449 Chronic obstructive pulmonary disease, unspecified: Secondary | ICD-10-CM | POA: Diagnosis not present

## 2021-05-22 DIAGNOSIS — N1832 Chronic kidney disease, stage 3b: Secondary | ICD-10-CM | POA: Diagnosis not present

## 2021-05-22 DIAGNOSIS — Z7401 Bed confinement status: Secondary | ICD-10-CM | POA: Diagnosis not present

## 2021-05-22 DIAGNOSIS — I69398 Other sequelae of cerebral infarction: Secondary | ICD-10-CM | POA: Diagnosis not present

## 2021-05-22 DIAGNOSIS — Z743 Need for continuous supervision: Secondary | ICD-10-CM | POA: Diagnosis not present

## 2021-05-22 DIAGNOSIS — R5381 Other malaise: Secondary | ICD-10-CM | POA: Diagnosis not present

## 2021-05-22 DIAGNOSIS — J9621 Acute and chronic respiratory failure with hypoxia: Secondary | ICD-10-CM | POA: Diagnosis not present

## 2021-05-22 DIAGNOSIS — S8292XD Unspecified fracture of left lower leg, subsequent encounter for closed fracture with routine healing: Secondary | ICD-10-CM | POA: Diagnosis not present

## 2021-05-22 DIAGNOSIS — R270 Ataxia, unspecified: Secondary | ICD-10-CM | POA: Diagnosis not present

## 2021-05-22 DIAGNOSIS — I129 Hypertensive chronic kidney disease with stage 1 through stage 4 chronic kidney disease, or unspecified chronic kidney disease: Secondary | ICD-10-CM | POA: Diagnosis not present

## 2021-05-22 DIAGNOSIS — E440 Moderate protein-calorie malnutrition: Secondary | ICD-10-CM | POA: Diagnosis not present

## 2021-05-22 DIAGNOSIS — S82842D Displaced bimalleolar fracture of left lower leg, subsequent encounter for closed fracture with routine healing: Secondary | ICD-10-CM | POA: Diagnosis not present

## 2021-05-22 DIAGNOSIS — D631 Anemia in chronic kidney disease: Secondary | ICD-10-CM | POA: Diagnosis not present

## 2021-05-23 DIAGNOSIS — L89311 Pressure ulcer of right buttock, stage 1: Secondary | ICD-10-CM | POA: Diagnosis not present

## 2021-05-23 DIAGNOSIS — D631 Anemia in chronic kidney disease: Secondary | ICD-10-CM | POA: Diagnosis not present

## 2021-05-23 DIAGNOSIS — R270 Ataxia, unspecified: Secondary | ICD-10-CM | POA: Diagnosis not present

## 2021-05-23 DIAGNOSIS — Z20822 Contact with and (suspected) exposure to covid-19: Secondary | ICD-10-CM | POA: Diagnosis not present

## 2021-05-23 DIAGNOSIS — N1832 Chronic kidney disease, stage 3b: Secondary | ICD-10-CM | POA: Diagnosis not present

## 2021-05-23 DIAGNOSIS — R5381 Other malaise: Secondary | ICD-10-CM | POA: Diagnosis not present

## 2021-05-23 DIAGNOSIS — J9622 Acute and chronic respiratory failure with hypercapnia: Secondary | ICD-10-CM | POA: Diagnosis not present

## 2021-05-23 DIAGNOSIS — J9621 Acute and chronic respiratory failure with hypoxia: Secondary | ICD-10-CM | POA: Diagnosis not present

## 2021-05-23 DIAGNOSIS — I129 Hypertensive chronic kidney disease with stage 1 through stage 4 chronic kidney disease, or unspecified chronic kidney disease: Secondary | ICD-10-CM | POA: Diagnosis not present

## 2021-05-23 DIAGNOSIS — E440 Moderate protein-calorie malnutrition: Secondary | ICD-10-CM | POA: Diagnosis not present

## 2021-05-23 DIAGNOSIS — L89321 Pressure ulcer of left buttock, stage 1: Secondary | ICD-10-CM | POA: Diagnosis not present

## 2021-05-23 DIAGNOSIS — I69398 Other sequelae of cerebral infarction: Secondary | ICD-10-CM | POA: Diagnosis not present

## 2021-05-23 DIAGNOSIS — E7800 Pure hypercholesterolemia, unspecified: Secondary | ICD-10-CM | POA: Diagnosis not present

## 2021-05-23 DIAGNOSIS — F32A Depression, unspecified: Secondary | ICD-10-CM | POA: Diagnosis not present

## 2021-05-23 DIAGNOSIS — G609 Hereditary and idiopathic neuropathy, unspecified: Secondary | ICD-10-CM | POA: Diagnosis not present

## 2021-05-23 DIAGNOSIS — J449 Chronic obstructive pulmonary disease, unspecified: Secondary | ICD-10-CM | POA: Diagnosis not present

## 2021-05-23 DIAGNOSIS — S82842D Displaced bimalleolar fracture of left lower leg, subsequent encounter for closed fracture with routine healing: Secondary | ICD-10-CM | POA: Diagnosis not present

## 2021-05-24 DIAGNOSIS — R5381 Other malaise: Secondary | ICD-10-CM | POA: Diagnosis not present

## 2021-05-24 DIAGNOSIS — E7800 Pure hypercholesterolemia, unspecified: Secondary | ICD-10-CM | POA: Diagnosis not present

## 2021-05-24 DIAGNOSIS — F32A Depression, unspecified: Secondary | ICD-10-CM | POA: Diagnosis not present

## 2021-05-24 DIAGNOSIS — Z9981 Dependence on supplemental oxygen: Secondary | ICD-10-CM | POA: Diagnosis not present

## 2021-05-24 DIAGNOSIS — J9622 Acute and chronic respiratory failure with hypercapnia: Secondary | ICD-10-CM | POA: Diagnosis not present

## 2021-05-24 DIAGNOSIS — J449 Chronic obstructive pulmonary disease, unspecified: Secondary | ICD-10-CM | POA: Diagnosis not present

## 2021-05-24 DIAGNOSIS — Z7901 Long term (current) use of anticoagulants: Secondary | ICD-10-CM | POA: Diagnosis not present

## 2021-05-24 DIAGNOSIS — Z930 Tracheostomy status: Secondary | ICD-10-CM | POA: Diagnosis not present

## 2021-05-24 DIAGNOSIS — Z86718 Personal history of other venous thrombosis and embolism: Secondary | ICD-10-CM | POA: Diagnosis not present

## 2021-05-24 DIAGNOSIS — N1832 Chronic kidney disease, stage 3b: Secondary | ICD-10-CM | POA: Diagnosis not present

## 2021-05-24 DIAGNOSIS — J9621 Acute and chronic respiratory failure with hypoxia: Secondary | ICD-10-CM | POA: Diagnosis not present

## 2021-05-24 DIAGNOSIS — E440 Moderate protein-calorie malnutrition: Secondary | ICD-10-CM | POA: Diagnosis not present

## 2021-05-24 DIAGNOSIS — L89311 Pressure ulcer of right buttock, stage 1: Secondary | ICD-10-CM | POA: Diagnosis not present

## 2021-05-24 DIAGNOSIS — Z85818 Personal history of malignant neoplasm of other sites of lip, oral cavity, and pharynx: Secondary | ICD-10-CM | POA: Diagnosis not present

## 2021-05-24 DIAGNOSIS — S82842D Displaced bimalleolar fracture of left lower leg, subsequent encounter for closed fracture with routine healing: Secondary | ICD-10-CM | POA: Diagnosis not present

## 2021-05-24 DIAGNOSIS — L89321 Pressure ulcer of left buttock, stage 1: Secondary | ICD-10-CM | POA: Diagnosis not present

## 2021-05-24 DIAGNOSIS — R270 Ataxia, unspecified: Secondary | ICD-10-CM | POA: Diagnosis not present

## 2021-05-24 DIAGNOSIS — D631 Anemia in chronic kidney disease: Secondary | ICD-10-CM | POA: Diagnosis not present

## 2021-05-24 DIAGNOSIS — I129 Hypertensive chronic kidney disease with stage 1 through stage 4 chronic kidney disease, or unspecified chronic kidney disease: Secondary | ICD-10-CM | POA: Diagnosis not present

## 2021-05-24 DIAGNOSIS — G609 Hereditary and idiopathic neuropathy, unspecified: Secondary | ICD-10-CM | POA: Diagnosis not present

## 2021-05-24 DIAGNOSIS — I69398 Other sequelae of cerebral infarction: Secondary | ICD-10-CM | POA: Diagnosis not present

## 2021-05-24 DIAGNOSIS — Z8781 Personal history of (healed) traumatic fracture: Secondary | ICD-10-CM | POA: Diagnosis not present

## 2021-05-28 DIAGNOSIS — I129 Hypertensive chronic kidney disease with stage 1 through stage 4 chronic kidney disease, or unspecified chronic kidney disease: Secondary | ICD-10-CM | POA: Diagnosis not present

## 2021-05-28 DIAGNOSIS — L89321 Pressure ulcer of left buttock, stage 1: Secondary | ICD-10-CM | POA: Diagnosis not present

## 2021-05-28 DIAGNOSIS — L89311 Pressure ulcer of right buttock, stage 1: Secondary | ICD-10-CM | POA: Diagnosis not present

## 2021-05-28 DIAGNOSIS — S82842D Displaced bimalleolar fracture of left lower leg, subsequent encounter for closed fracture with routine healing: Secondary | ICD-10-CM | POA: Diagnosis not present

## 2021-05-28 DIAGNOSIS — J9621 Acute and chronic respiratory failure with hypoxia: Secondary | ICD-10-CM | POA: Diagnosis not present

## 2021-05-28 DIAGNOSIS — R270 Ataxia, unspecified: Secondary | ICD-10-CM | POA: Diagnosis not present

## 2021-05-28 DIAGNOSIS — N1832 Chronic kidney disease, stage 3b: Secondary | ICD-10-CM | POA: Diagnosis not present

## 2021-05-28 DIAGNOSIS — E7800 Pure hypercholesterolemia, unspecified: Secondary | ICD-10-CM | POA: Diagnosis not present

## 2021-05-28 DIAGNOSIS — G609 Hereditary and idiopathic neuropathy, unspecified: Secondary | ICD-10-CM | POA: Diagnosis not present

## 2021-05-28 DIAGNOSIS — J449 Chronic obstructive pulmonary disease, unspecified: Secondary | ICD-10-CM | POA: Diagnosis not present

## 2021-05-28 DIAGNOSIS — D631 Anemia in chronic kidney disease: Secondary | ICD-10-CM | POA: Diagnosis not present

## 2021-05-28 DIAGNOSIS — J9622 Acute and chronic respiratory failure with hypercapnia: Secondary | ICD-10-CM | POA: Diagnosis not present

## 2021-05-28 DIAGNOSIS — E440 Moderate protein-calorie malnutrition: Secondary | ICD-10-CM | POA: Diagnosis not present

## 2021-05-28 DIAGNOSIS — I69398 Other sequelae of cerebral infarction: Secondary | ICD-10-CM | POA: Diagnosis not present

## 2021-05-28 DIAGNOSIS — R5381 Other malaise: Secondary | ICD-10-CM | POA: Diagnosis not present

## 2021-05-28 DIAGNOSIS — F32A Depression, unspecified: Secondary | ICD-10-CM | POA: Diagnosis not present

## 2021-06-03 DIAGNOSIS — J9622 Acute and chronic respiratory failure with hypercapnia: Secondary | ICD-10-CM | POA: Diagnosis not present

## 2021-06-03 DIAGNOSIS — J9621 Acute and chronic respiratory failure with hypoxia: Secondary | ICD-10-CM | POA: Diagnosis not present

## 2021-06-03 DIAGNOSIS — R5381 Other malaise: Secondary | ICD-10-CM | POA: Diagnosis not present

## 2021-06-03 DIAGNOSIS — J449 Chronic obstructive pulmonary disease, unspecified: Secondary | ICD-10-CM | POA: Diagnosis not present

## 2021-06-03 DIAGNOSIS — E440 Moderate protein-calorie malnutrition: Secondary | ICD-10-CM | POA: Diagnosis not present

## 2021-06-03 DIAGNOSIS — I69398 Other sequelae of cerebral infarction: Secondary | ICD-10-CM | POA: Diagnosis not present

## 2021-06-03 DIAGNOSIS — L89321 Pressure ulcer of left buttock, stage 1: Secondary | ICD-10-CM | POA: Diagnosis not present

## 2021-06-03 DIAGNOSIS — E7800 Pure hypercholesterolemia, unspecified: Secondary | ICD-10-CM | POA: Diagnosis not present

## 2021-06-03 DIAGNOSIS — F32A Depression, unspecified: Secondary | ICD-10-CM | POA: Diagnosis not present

## 2021-06-03 DIAGNOSIS — L89311 Pressure ulcer of right buttock, stage 1: Secondary | ICD-10-CM | POA: Diagnosis not present

## 2021-06-03 DIAGNOSIS — S82842D Displaced bimalleolar fracture of left lower leg, subsequent encounter for closed fracture with routine healing: Secondary | ICD-10-CM | POA: Diagnosis not present

## 2021-06-03 DIAGNOSIS — R270 Ataxia, unspecified: Secondary | ICD-10-CM | POA: Diagnosis not present

## 2021-06-03 DIAGNOSIS — N1832 Chronic kidney disease, stage 3b: Secondary | ICD-10-CM | POA: Diagnosis not present

## 2021-06-03 DIAGNOSIS — I129 Hypertensive chronic kidney disease with stage 1 through stage 4 chronic kidney disease, or unspecified chronic kidney disease: Secondary | ICD-10-CM | POA: Diagnosis not present

## 2021-06-03 DIAGNOSIS — G609 Hereditary and idiopathic neuropathy, unspecified: Secondary | ICD-10-CM | POA: Diagnosis not present

## 2021-06-03 DIAGNOSIS — D631 Anemia in chronic kidney disease: Secondary | ICD-10-CM | POA: Diagnosis not present

## 2021-06-10 DIAGNOSIS — I129 Hypertensive chronic kidney disease with stage 1 through stage 4 chronic kidney disease, or unspecified chronic kidney disease: Secondary | ICD-10-CM | POA: Diagnosis not present

## 2021-06-10 DIAGNOSIS — J9621 Acute and chronic respiratory failure with hypoxia: Secondary | ICD-10-CM | POA: Diagnosis not present

## 2021-06-10 DIAGNOSIS — G609 Hereditary and idiopathic neuropathy, unspecified: Secondary | ICD-10-CM | POA: Diagnosis not present

## 2021-06-10 DIAGNOSIS — L89321 Pressure ulcer of left buttock, stage 1: Secondary | ICD-10-CM | POA: Diagnosis not present

## 2021-06-10 DIAGNOSIS — E440 Moderate protein-calorie malnutrition: Secondary | ICD-10-CM | POA: Diagnosis not present

## 2021-06-10 DIAGNOSIS — S82842D Displaced bimalleolar fracture of left lower leg, subsequent encounter for closed fracture with routine healing: Secondary | ICD-10-CM | POA: Diagnosis not present

## 2021-06-10 DIAGNOSIS — E7800 Pure hypercholesterolemia, unspecified: Secondary | ICD-10-CM | POA: Diagnosis not present

## 2021-06-10 DIAGNOSIS — D631 Anemia in chronic kidney disease: Secondary | ICD-10-CM | POA: Diagnosis not present

## 2021-06-10 DIAGNOSIS — L89311 Pressure ulcer of right buttock, stage 1: Secondary | ICD-10-CM | POA: Diagnosis not present

## 2021-06-10 DIAGNOSIS — N1832 Chronic kidney disease, stage 3b: Secondary | ICD-10-CM | POA: Diagnosis not present

## 2021-06-10 DIAGNOSIS — J9622 Acute and chronic respiratory failure with hypercapnia: Secondary | ICD-10-CM | POA: Diagnosis not present

## 2021-06-10 DIAGNOSIS — R5381 Other malaise: Secondary | ICD-10-CM | POA: Diagnosis not present

## 2021-06-10 DIAGNOSIS — F32A Depression, unspecified: Secondary | ICD-10-CM | POA: Diagnosis not present

## 2021-06-10 DIAGNOSIS — I69398 Other sequelae of cerebral infarction: Secondary | ICD-10-CM | POA: Diagnosis not present

## 2021-06-10 DIAGNOSIS — R270 Ataxia, unspecified: Secondary | ICD-10-CM | POA: Diagnosis not present

## 2021-06-10 DIAGNOSIS — J449 Chronic obstructive pulmonary disease, unspecified: Secondary | ICD-10-CM | POA: Diagnosis not present

## 2021-06-12 ENCOUNTER — Other Ambulatory Visit: Payer: Medicare Other

## 2021-06-12 ENCOUNTER — Other Ambulatory Visit: Payer: Medicare Other | Admitting: *Deleted

## 2021-06-12 VITALS — BP 183/104 | HR 72 | Temp 97.9°F | Resp 18

## 2021-06-12 DIAGNOSIS — Z515 Encounter for palliative care: Secondary | ICD-10-CM

## 2021-06-12 DIAGNOSIS — Z20822 Contact with and (suspected) exposure to covid-19: Secondary | ICD-10-CM | POA: Diagnosis not present

## 2021-06-12 NOTE — Progress Notes (Signed)
COMMUNITY PALLIATIVE CARE SW NOTE ? ?PATIENT NAME: Katelyn [name] ?DOB: 06/24/1950 ?MRN: [id number] ? ?PRIMARY CARE PROVIDER: Aretta Nip, MD ? ?RESPONSIBLE PARTY:  ?Acct ID - Guarantor Home Phone Work Phone Relationship Acct Type  ?[id number] CHIQUITTA, [phone]  Self P/F  ?   [address] [place], [place] [address]  ? ?PC SOCIAL WORK HOME VISIT ? ?PC SW completed a visit with patient at her home to assess her needs, comfort and provide support. She was present with her daughter-[name]. Patient was observed sitting in her transport wheelchair. She was awake and alert. Patient was engaged, but it is difficulty told understand her due to her trache. Her daughter provided a  status update on patient. She continues to have a rash on her neck and under her breast that is improving. She denied pain or shortness of breath. However, patient has increased anxiety that will cause shortness of breath intermittently. Patient continues to receive physical therapy 1x/week with [facility]. Her ankle is improving and she is wearing a brace for additional support. Patient is not making an effort to ambulate ( with or without assistance) outside of visits with the physical therapist. Patient was approved for medicaid. Her daughter has received a call from [facility] services to set an assessment. SW completed this application three weeks ago and forwarded it to her PCP to review, sign and forward to [facility] services. SW encouraged her daughter to call and schedule the assessment as soon as possible. SW assessed for any additional needs or concerns-none were noted.  ?Patient and daughter are open to ongoing visits/support from palliative care team.   ?SW to follow-up in 6-8 weeks.  ? ? ?Duration of visit and documentation: 60 minutes ? ?Katheren Puller, LCSW ? ?

## 2021-06-19 DIAGNOSIS — F32A Depression, unspecified: Secondary | ICD-10-CM | POA: Diagnosis not present

## 2021-06-19 DIAGNOSIS — L89311 Pressure ulcer of right buttock, stage 1: Secondary | ICD-10-CM | POA: Diagnosis not present

## 2021-06-19 DIAGNOSIS — D631 Anemia in chronic kidney disease: Secondary | ICD-10-CM | POA: Diagnosis not present

## 2021-06-19 DIAGNOSIS — J449 Chronic obstructive pulmonary disease, unspecified: Secondary | ICD-10-CM | POA: Diagnosis not present

## 2021-06-19 DIAGNOSIS — J9622 Acute and chronic respiratory failure with hypercapnia: Secondary | ICD-10-CM | POA: Diagnosis not present

## 2021-06-19 DIAGNOSIS — L89321 Pressure ulcer of left buttock, stage 1: Secondary | ICD-10-CM | POA: Diagnosis not present

## 2021-06-19 DIAGNOSIS — E7800 Pure hypercholesterolemia, unspecified: Secondary | ICD-10-CM | POA: Diagnosis not present

## 2021-06-19 DIAGNOSIS — I129 Hypertensive chronic kidney disease with stage 1 through stage 4 chronic kidney disease, or unspecified chronic kidney disease: Secondary | ICD-10-CM | POA: Diagnosis not present

## 2021-06-19 DIAGNOSIS — I69398 Other sequelae of cerebral infarction: Secondary | ICD-10-CM | POA: Diagnosis not present

## 2021-06-19 DIAGNOSIS — J9621 Acute and chronic respiratory failure with hypoxia: Secondary | ICD-10-CM | POA: Diagnosis not present

## 2021-06-19 DIAGNOSIS — N1832 Chronic kidney disease, stage 3b: Secondary | ICD-10-CM | POA: Diagnosis not present

## 2021-06-19 DIAGNOSIS — R270 Ataxia, unspecified: Secondary | ICD-10-CM | POA: Diagnosis not present

## 2021-06-19 DIAGNOSIS — E440 Moderate protein-calorie malnutrition: Secondary | ICD-10-CM | POA: Diagnosis not present

## 2021-06-19 DIAGNOSIS — S82842D Displaced bimalleolar fracture of left lower leg, subsequent encounter for closed fracture with routine healing: Secondary | ICD-10-CM | POA: Diagnosis not present

## 2021-06-19 DIAGNOSIS — G609 Hereditary and idiopathic neuropathy, unspecified: Secondary | ICD-10-CM | POA: Diagnosis not present

## 2021-06-19 DIAGNOSIS — R5381 Other malaise: Secondary | ICD-10-CM | POA: Diagnosis not present

## 2021-06-24 DIAGNOSIS — Z20822 Contact with and (suspected) exposure to covid-19: Secondary | ICD-10-CM | POA: Diagnosis not present

## 2021-07-03 DIAGNOSIS — Z91199 Patient's noncompliance with other medical treatment and regimen due to unspecified reason: Secondary | ICD-10-CM | POA: Diagnosis not present

## 2021-07-03 DIAGNOSIS — J449 Chronic obstructive pulmonary disease, unspecified: Secondary | ICD-10-CM | POA: Diagnosis not present

## 2021-07-03 DIAGNOSIS — L853 Xerosis cutis: Secondary | ICD-10-CM | POA: Diagnosis not present

## 2021-07-03 DIAGNOSIS — Z8781 Personal history of (healed) traumatic fracture: Secondary | ICD-10-CM | POA: Diagnosis not present

## 2021-07-03 DIAGNOSIS — I10 Essential (primary) hypertension: Secondary | ICD-10-CM | POA: Diagnosis not present

## 2021-07-03 DIAGNOSIS — I6930 Unspecified sequelae of cerebral infarction: Secondary | ICD-10-CM | POA: Diagnosis not present

## 2021-07-03 DIAGNOSIS — Z9002 Acquired absence of larynx: Secondary | ICD-10-CM | POA: Diagnosis not present

## 2021-07-03 DIAGNOSIS — R531 Weakness: Secondary | ICD-10-CM | POA: Diagnosis not present

## 2021-07-03 DIAGNOSIS — R5381 Other malaise: Secondary | ICD-10-CM | POA: Diagnosis not present

## 2021-07-03 DIAGNOSIS — Z85819 Personal history of malignant neoplasm of unspecified site of lip, oral cavity, and pharynx: Secondary | ICD-10-CM | POA: Diagnosis not present

## 2021-07-03 DIAGNOSIS — Z743 Need for continuous supervision: Secondary | ICD-10-CM | POA: Diagnosis not present

## 2021-07-03 DIAGNOSIS — G459 Transient cerebral ischemic attack, unspecified: Secondary | ICD-10-CM | POA: Diagnosis not present

## 2021-07-03 DIAGNOSIS — E7800 Pure hypercholesterolemia, unspecified: Secondary | ICD-10-CM | POA: Diagnosis not present

## 2021-07-11 DIAGNOSIS — Z743 Need for continuous supervision: Secondary | ICD-10-CM | POA: Diagnosis not present

## 2021-07-11 DIAGNOSIS — R531 Weakness: Secondary | ICD-10-CM | POA: Diagnosis not present

## 2021-07-11 DIAGNOSIS — R0902 Hypoxemia: Secondary | ICD-10-CM | POA: Diagnosis not present

## 2021-07-17 ENCOUNTER — Other Ambulatory Visit: Payer: Medicare Other | Admitting: *Deleted

## 2021-07-17 DIAGNOSIS — Z515 Encounter for palliative care: Secondary | ICD-10-CM

## 2021-07-17 NOTE — Progress Notes (Signed)
[facility] RN NOTE  PATIENT NAME: Katelyn Katelyn [name] DOB: 27-Sep-1950 MRN: [id number]  PRIMARY CARE PROVIDER: Aretta Nip, MD  RESPONSIBLE PARTY: Katelyn Katelyn [name] (daughter) Acct ID - Guarantor Home Phone Work Phone Relationship Acct Type  [id number] IA, [id number]  Self P/F     [address], [place], [place] [address]   RN joint visit made with palliative care SW, Katelyn Katelyn [name]. Met with patient and daughter Katelyn Katelyn [name] in patient's home.  Patient denies pain. She recently had a visit with Ortho and they state her left ankle fracture has healed. She is currently in a brace and they want her to continue wearing this for support and better mobility. They have ordered her a transport chair. She continues to receive PT through [facility] home health 1x/week. She has an appointment with Katelyn Katelyn [name] on 06/26/21. Daughter advised that Katelyn Katelyn [name] is moving to [place] the end of May Katelyn [name] patient will have to decide on a new PCP. Patient has anxiety. She takes Xanax prn. She is sob at times and where's oxygen as needed. Her appetite is good. She is taking her medications without difficulty. The rash on her neck and underneath her breath is clearing up. Bowel movements have been more regular. LBM was yesterday. She was happy to report that she travelled outside of her home to [facility] since my last visit, as patient usually declines going outside of the home other than to doctor appointments. She was supposed to have an appointment with a Dentist as she wants dentures, however she didn't want to go Katelyn [name] daughter has to reschedule. She has a follow up in person visit with her CCM Katelyn Katelyn [name] on 07/10/21'@11a' . Next palliative care visit scheduled for 07/17/21'@11a' . Palliative care will continue to follow.   PHYSICAL EXAM:   VITALS: Today's Vitals   06/12/21 1145  BP: (!) 183/104  Pulse: 72  Resp: 18  Temp: 97.9 F (36.6 C)  TempSrc: Temporal  SpO2: 96%  PainSc: 0-No pain    LUNGS:  clear to auscultation  CARDIAC: Cor RRR EXTREMITIES: No edema SKIN:  Rash clearing to neck and underneath breasts   NEURO:  Alert and oriented x 3, pleasant mood, generalized weakness, ambulatory w/1 person assistance and rollator walker   (Duration of visit and documentation 60 minutes)    Katelyn Eastern, RN BSN

## 2021-07-18 NOTE — Progress Notes (Signed)
[place] PALLIATIVE CARE RN NOTE  PATIENT NAME: Katelyn [name] DOB: [birth year] MRN: [id number]  PRIMARY CARE PROVIDER: Aretta Nip, MD  RESPONSIBLE PARTY: [name] (daughter) Acct ID - Guarantor Home Phone Work Phone Relationship Acct Type  [id number] Katelyn, [phone]  Self P/F     [address], [place], [place] [address]   Due to the COVID-19 crisis, this virtual check-in visit was done via telephone from my office and it was initiated and consent by this patient and or family  RN telephonic encounter completed with patient's daughter-[name]. She reports that patient had an appointment with her PCP earlier this month. She was put on Amlodipine 2.5 mg for her blood pressure. Patient also has areas that are starting to open back up over the areas where breakdown was present before. MD to order wound care RN. She continues to do PT w/[facility] 1x/week. Daughter states there are times that patient participates and does well, however at other times she will tell them she can't do the exercises/tasks requested by PT. Daughter also says often times if her significant other comes in during a session, patient will be up walking with her rollator and 1 person assistance initially, but as soon as she sees him she will act like she can't do anything. She relies heavily on her significant other [name] who is older than her and has his own health issues. He had a gout flare up 2 weeks ago and was unable to help her so [name] and her son had to step in and assist her. She requires assistance with all ADLs except feeding. Daughter states she is not taking her medications on a daily basis as prescribed. She is also having issues with her oxygen dropping. When she was taken to her Dentist last week her levels were in the 75s. She does not wear her oxygen much while in the home. She is being noncompliant per daughter. Dr. Zadie Rhine is moving to [facility] so patient will be transitioned to  another provider in their practice. Palliative care will continue to follow. RN in person visit is scheduled for 07/25/21'@11a'$ .   (Duration of visit and documentation 30 minutes)   Daryl Eastern, RN BSN

## 2021-07-24 DIAGNOSIS — S8292XD Unspecified fracture of left lower leg, subsequent encounter for closed fracture with routine healing: Secondary | ICD-10-CM | POA: Diagnosis not present

## 2021-07-24 DIAGNOSIS — Z743 Need for continuous supervision: Secondary | ICD-10-CM | POA: Diagnosis not present

## 2021-07-24 DIAGNOSIS — R279 Unspecified lack of coordination: Secondary | ICD-10-CM | POA: Diagnosis not present

## 2021-07-25 ENCOUNTER — Other Ambulatory Visit: Payer: Self-pay | Admitting: *Deleted

## 2021-07-25 ENCOUNTER — Other Ambulatory Visit: Payer: Self-pay

## 2021-07-25 VITALS — BP 137/78 | HR 66 | Temp 97.6°F | Resp 18

## 2021-07-25 DIAGNOSIS — Z515 Encounter for palliative care: Secondary | ICD-10-CM

## 2021-07-25 NOTE — Progress Notes (Signed)
[facility] PALLIATIVE CARE SW NOTE  PATIENT NAME: Katelyn [name] DOB: 07/22/1950 MRN: [id number]  PRIMARY CARE PROVIDER: Aretta Nip, MD  RESPONSIBLE PARTY:  Acct ID - Guarantor Home Phone Work Phone Relationship Acct Type  [id number] IVIONA, [phone]  Self P/F     [address], [place], [place] [address]   [place] and RN-M. Nadara Mustard completed a face-to-face visit with patient at her home. Patient was awake and alert. She was sitting at her dining room table Patient was on her eye pad. Patient provided a status update on herself. She advised that she became agitated during her last physician visit because she thought she was going to fall. Her bottom was examined and she appear to have a few reddened areas. Patient denied any pain/discomfort related to it.  Patient report that she is having a cream put on her bottom 2x days. Patient was discharged by orthopedic doctor, but continue to wear her ankle brace. Patient saw her dentist and was fitted for dentures. Patient continue to have periods of anxiety, particularly when she goes to the doctor. Patient's daughter manages her pill box. She is taking her medications without any issues. She is also eating well. Her weight is being maintained.  No other concerns were noted. Patient remains open to ongoing palliative care visits and support.  CODE STATUS: Full code ADVANCED DIRECTIVES: No MOST FORM COMPLETE: No HOSPICE EDUCATION PROVIDED: NO   Ming Kunka, LCSW

## 2021-08-05 NOTE — Progress Notes (Signed)
[facility] RN NOTE  PATIENT NAME: Katelyn [name] DOB: 05-15-1950 MRN: [id number]  PRIMARY CARE PROVIDER: Aretta Nip, MD  RESPONSIBLE PARTY: [name] (daughter) Acct ID - Guarantor Home Phone Work Phone Relationship Acct Type  [id number] MUMTAZ, [id number] [phone]  Self P/F     [address], [place], [place] [address]    Joint RN/SW visit completed with patient in her home.   Pain: Patient has soreness at times in her right wrist which affects her ability to write. Also has some occasional pain in her left ankle. Has Tylenol available to help.  Mobility: She was recently discharged from Ortho as her left ankle fracture has healed completely. Wears ankle brace. Still fearful of walking with family as she is afraid of falling. Will walk with PT ([facility]) but at times she will tell them she cannot participate. Requires assistance with all ADLs except feeding.  Cardiovascular: She was started on Norvasc 2.5 mg daily and her BP has improved. She says she is taking her medications regularly. Assessed pill box and they are emptry where they should be. BP much improved today 137/78.   Anxiety: She reports experiencing anxiety and takes Xanax at night which helps her sleep some.  Skin: Has some skin breakdown on her bottom. Barrier cream being applied  I called and spoke with her daughter [name] to provide update on visit today. She is appreciative.  PHYSICAL EXAM:   VITALS: Today's Vitals   07/25/21 1137  BP: 137/78  Pulse: 66  Resp: 18  Temp: 97.6 F (36.4 C)  TempSrc: Temporal  SpO2: 95%  PainSc: 0-No pain    LUNGS: clear to auscultation  CARDIAC: Cor RRR EXTREMITIES: No edema SKIN:  Breakdown on bottom; barrier cream being applied   NEURO:  Alert and oriented x 3, difficult to understand at times due to tracheostomy, forgetful, generalized weakness, mainly tranported via wheelchair   (Duration of visit and documentation 60  minutes)    Daryl Eastern, RN BSN

## 2021-08-06 DIAGNOSIS — Z743 Need for continuous supervision: Secondary | ICD-10-CM | POA: Diagnosis not present

## 2021-08-06 DIAGNOSIS — R531 Weakness: Secondary | ICD-10-CM | POA: Diagnosis not present

## 2021-08-09 DIAGNOSIS — C329 Malignant neoplasm of larynx, unspecified: Secondary | ICD-10-CM | POA: Diagnosis not present

## 2021-08-09 DIAGNOSIS — R1310 Dysphagia, unspecified: Secondary | ICD-10-CM | POA: Diagnosis not present

## 2021-08-09 DIAGNOSIS — J449 Chronic obstructive pulmonary disease, unspecified: Secondary | ICD-10-CM | POA: Diagnosis not present

## 2021-08-09 DIAGNOSIS — C321 Malignant neoplasm of supraglottis: Secondary | ICD-10-CM | POA: Diagnosis not present

## 2021-08-23 DIAGNOSIS — I10 Essential (primary) hypertension: Secondary | ICD-10-CM | POA: Diagnosis not present

## 2021-08-23 DIAGNOSIS — L249 Irritant contact dermatitis, unspecified cause: Secondary | ICD-10-CM | POA: Diagnosis not present

## 2021-08-23 DIAGNOSIS — Z743 Need for continuous supervision: Secondary | ICD-10-CM | POA: Diagnosis not present

## 2021-08-23 DIAGNOSIS — Z9981 Dependence on supplemental oxygen: Secondary | ICD-10-CM | POA: Diagnosis not present

## 2021-08-23 DIAGNOSIS — Z79899 Other long term (current) drug therapy: Secondary | ICD-10-CM | POA: Diagnosis not present

## 2021-08-23 DIAGNOSIS — E7800 Pure hypercholesterolemia, unspecified: Secondary | ICD-10-CM | POA: Diagnosis not present

## 2021-08-23 DIAGNOSIS — Z7401 Bed confinement status: Secondary | ICD-10-CM | POA: Diagnosis not present

## 2021-08-23 DIAGNOSIS — Z930 Tracheostomy status: Secondary | ICD-10-CM | POA: Diagnosis not present

## 2021-08-23 DIAGNOSIS — R0682 Tachypnea, not elsewhere classified: Secondary | ICD-10-CM | POA: Diagnosis not present

## 2021-08-23 DIAGNOSIS — R279 Unspecified lack of coordination: Secondary | ICD-10-CM | POA: Diagnosis not present

## 2021-08-23 DIAGNOSIS — R5383 Other fatigue: Secondary | ICD-10-CM | POA: Diagnosis not present

## 2021-08-23 DIAGNOSIS — R062 Wheezing: Secondary | ICD-10-CM | POA: Diagnosis not present

## 2021-08-23 DIAGNOSIS — I6930 Unspecified sequelae of cerebral infarction: Secondary | ICD-10-CM | POA: Diagnosis not present

## 2021-08-23 DIAGNOSIS — R1310 Dysphagia, unspecified: Secondary | ICD-10-CM | POA: Diagnosis not present

## 2021-08-23 DIAGNOSIS — J449 Chronic obstructive pulmonary disease, unspecified: Secondary | ICD-10-CM | POA: Diagnosis not present

## 2021-08-30 DIAGNOSIS — R32 Unspecified urinary incontinence: Secondary | ICD-10-CM | POA: Diagnosis not present

## 2021-08-30 DIAGNOSIS — F0390 Unspecified dementia without behavioral disturbance: Secondary | ICD-10-CM | POA: Diagnosis not present

## 2021-09-04 ENCOUNTER — Other Ambulatory Visit: Payer: Medicare Other | Admitting: *Deleted

## 2021-09-04 ENCOUNTER — Other Ambulatory Visit: Payer: Medicare Other

## 2021-09-04 VITALS — BP 134/83 | HR 98 | Temp 97.6°F | Resp 20

## 2021-09-04 DIAGNOSIS — Z515 Encounter for palliative care: Secondary | ICD-10-CM

## 2021-09-04 NOTE — Progress Notes (Signed)
[facility] PALLIATIVE CARE SW NOTE  PATIENT NAME: Ayako Tapanes DOB: [birth year] MRN: [id number]  PRIMARY CARE PROVIDER: Aretta Nip, MD  RESPONSIBLE PARTY:  Acct ID - Guarantor Home Phone Work Phone Relationship Acct Type  [id number] NAYA, [id number]  Self P/F     [address] [place], [place] [address]   SOCIAL WORK ENCOUNTER  PC SW and RN-M. Howard completed a visit with patient at her home. Patient's  daughter-[name], granddaughter and partner-[name] (on the porch) was present for the visit. Patient was sitting at the kitchen table when the team arrived. She was awake, alert and engaged. She advised that team that she has completed therapy, but does do her own exercises at times. She denied pain during this visit, but report occasional pain to her left ankle and right wrist.  Patient report not sleeping well at night and increased intermittent anxiety. She has medication  in place to manage this. SW inquired if they follow-up with the representative regarding PCS services. Her daughter advised they did not and requested that the application be re-submitted-SW to follow-up. The patient and her daughter was provided education regarding HCPOA and MOST form. Patient does not desire to complete the HCPOA. However, she has some interest in completing a MOST form, after receiving additional education. SW to bring form on next visit. Patient desires to be a FULL CODE.  Next visit scheduled for 10/09/21 @ 11am.      CODE STATUS: FULL CODE ADVANCED DIRECTIVES: No MOST FORM COMPLETE:  Education provided HOSPICE EDUCATION PROVIDED: No  Duration of visit and documentation: 60 minutes  Fernando Torry, LCSW

## 2021-09-04 NOTE — Progress Notes (Signed)
[facility] RN NOTE  PATIENT NAME: Katelyn [name] DOB: Dec 22, 1950 MRN: [id number]  PRIMARY CARE PROVIDER: Aretta Nip, MD  RESPONSIBLE PARTY: [name] (daughter) Acct ID - Guarantor Home Phone Work Phone Relationship Acct Type  [id number] [name], [name]  Self P/F     [address] [place], [place] [address]   RN/SW face to face visit completed with patient in her home. Daughter [name] and granddaughter present during visit. Significant other [name] outside sitting on the porch but able to provide patient update prior to visit.  Pain: Patient denies pain at this time. She does have occasional pain in her left ankle and right wrist. She has Tylenol available if needed.  Cardiovascular: Patient was recently started on Triamt/HCTZ 37.5-25 mg, 1/2 tablet daily in the am. She also takes Metoprolol twice daily. Blood pressures have been more stable.   Respiratory: She has oxygen available in the home but daughter states she rarely uses this unless she becomes short of breath. Occasional anxiety and she says she has been taken off of PRN Xanax. She was recently started on Escitalopram 10 mg (initially started at '5mg'$  x 7 days then '10mg'$ ).  Mobility: She has been discharged from PT with [facility]. She says she does the exercises sometimes that were given to her. Continues to require 1 person assistance with standing, transfers, bathing, dressing and toileting. Transported via wheelchair. Feeds self independently.  Skin: No open areas on bottom but barrier cream continues to be applied daily for protection.  Overall she reports feeling good today and has no concerns at this time. She is still waiting for her dentures to come in. Reviewed medications. Appears she has been taken them as prescribed. Follow up visit scheduled for 10/09/21'@11a'$ .  CODE STATUS: Full Code ADVANCED DIRECTIVES: patient does not wish to complete MOST FORM: no PPS:  30%   PHYSICAL EXAM:   VITALS: Today's Vitals   09/04/21 1126  BP: 134/83  Pulse: 98  Resp: 20  Temp: 97.6 F (36.4 C)  TempSrc: Temporal  SpO2: 94%  PainSc: 0-No pain    LUNGS: clear to auscultation  CARDIAC: Cor RRR EXTREMITIES: No edema SKIN:  Hands are cool to touch; No open areas but barrier cream being applied to bottom daily   NEURO:  Alert and oriented x 3, pleasant mood, transported via w/c, requires 1 person assistance with ADLs   Daryl Eastern, RN BSN

## 2021-09-08 DIAGNOSIS — C321 Malignant neoplasm of supraglottis: Secondary | ICD-10-CM | POA: Diagnosis not present

## 2021-09-08 DIAGNOSIS — J449 Chronic obstructive pulmonary disease, unspecified: Secondary | ICD-10-CM | POA: Diagnosis not present

## 2021-09-08 DIAGNOSIS — C329 Malignant neoplasm of larynx, unspecified: Secondary | ICD-10-CM | POA: Diagnosis not present

## 2021-09-08 DIAGNOSIS — R1310 Dysphagia, unspecified: Secondary | ICD-10-CM | POA: Diagnosis not present

## 2021-09-10 DIAGNOSIS — R1310 Dysphagia, unspecified: Secondary | ICD-10-CM | POA: Diagnosis not present

## 2021-09-10 DIAGNOSIS — C321 Malignant neoplasm of supraglottis: Secondary | ICD-10-CM | POA: Diagnosis not present

## 2021-09-10 DIAGNOSIS — Z930 Tracheostomy status: Secondary | ICD-10-CM | POA: Diagnosis not present

## 2021-09-10 DIAGNOSIS — C329 Malignant neoplasm of larynx, unspecified: Secondary | ICD-10-CM | POA: Diagnosis not present

## 2021-09-10 DIAGNOSIS — J449 Chronic obstructive pulmonary disease, unspecified: Secondary | ICD-10-CM | POA: Diagnosis not present

## 2021-10-09 ENCOUNTER — Other Ambulatory Visit: Payer: Medicare Other | Admitting: *Deleted

## 2021-10-09 VITALS — BP 134/83 | HR 56 | Temp 97.8°F | Resp 17

## 2021-10-09 DIAGNOSIS — C329 Malignant neoplasm of larynx, unspecified: Secondary | ICD-10-CM | POA: Diagnosis not present

## 2021-10-09 DIAGNOSIS — Z515 Encounter for palliative care: Secondary | ICD-10-CM

## 2021-10-09 DIAGNOSIS — J449 Chronic obstructive pulmonary disease, unspecified: Secondary | ICD-10-CM | POA: Diagnosis not present

## 2021-10-09 DIAGNOSIS — R1310 Dysphagia, unspecified: Secondary | ICD-10-CM | POA: Diagnosis not present

## 2021-10-09 DIAGNOSIS — C321 Malignant neoplasm of supraglottis: Secondary | ICD-10-CM | POA: Diagnosis not present

## 2021-10-09 NOTE — Progress Notes (Signed)
[facility] RN NOTE  PATIENT NAME: Katelyn [name] DOB: Jul 05, 1950 MRN: [id number]  PRIMARY CARE PROVIDER: Aretta Nip, MD  RESPONSIBLE PARTY: Katelyn [name] (daughter) Acct ID - Guarantor Home Phone Work Phone Relationship Acct Type  [id number] [name], [name]  Self P/F     [address], [place], [place] [address]    RN follow up visit completed with patient in her home. Daughter [name] also present.  Pain: Denies pain or discomfort at this time. Does experience pain at times in right hand, wrist and up right arm. Takes Tylenol to help.  Respiratory: Reports occasional shortness of breath at rest. Feels is more anxiety related. Doesn't happen all the time. Has oxygen that she wears some nights and can wear during the day. Encouraged oxygen use when necessary. She has been taken off of Xanax, but is now on Escitalopram 10 mg.  Cardiovascular: Blood pressure are much improved since starting on Triamterene/HCTZ.   Mobility: Continues to require 1 person assistance with all ADLs except feeding. She says she can walk, but is afraid even though her ankle fracture has completely healed. Transported via wheelchair.  Skin: Continues with barrier cream being applied daily. No sores or open areas.  Medications reviewed. Patient appears to be taking medications as prescribed. Palliative care team will continue to follow. Next visit scheduled for 11/13/21'@11a'$ .   CODE STATUS: Full code ADVANCED DIRECTIVES: N MOST FORM: no PPS: 30%   PHYSICAL EXAM:   VITALS: Today's Vitals   10/09/21 1253  BP: 134/83  Pulse: (!) 56  Resp: 17  Temp: 97.8 F (36.6 C)  TempSrc: Temporal  PainSc: 0-No pain    LUNGS: clear to auscultation  CARDIAC: Cor Loletha Grayer EXTREMITIES: No edema, hands cool to touch SKIN:  Exposed skin is dry and intact; Barrier cream applied to bottom daily   NEURO:  Alert and oriented x 3, pleasant mood, requires 1 person assistance  with ADLs, mainly transported via wheelchair    Daryl Eastern, RN BSN

## 2021-10-11 ENCOUNTER — Telehealth: Payer: Self-pay

## 2021-10-11 NOTE — Telephone Encounter (Signed)
(  1: 53 pm) PC SW emailed a copy of PCS application that was completed on patient's behalf. The PCS application was also faxed to Dr. De Blanch office to be completed.

## 2021-11-09 DIAGNOSIS — C321 Malignant neoplasm of supraglottis: Secondary | ICD-10-CM | POA: Diagnosis not present

## 2021-11-09 DIAGNOSIS — R1310 Dysphagia, unspecified: Secondary | ICD-10-CM | POA: Diagnosis not present

## 2021-11-09 DIAGNOSIS — C329 Malignant neoplasm of larynx, unspecified: Secondary | ICD-10-CM | POA: Diagnosis not present

## 2021-11-09 DIAGNOSIS — J449 Chronic obstructive pulmonary disease, unspecified: Secondary | ICD-10-CM | POA: Diagnosis not present

## 2021-11-12 DIAGNOSIS — Z7401 Bed confinement status: Secondary | ICD-10-CM | POA: Diagnosis not present

## 2021-11-12 DIAGNOSIS — Z743 Need for continuous supervision: Secondary | ICD-10-CM | POA: Diagnosis not present

## 2021-11-12 DIAGNOSIS — R4182 Altered mental status, unspecified: Secondary | ICD-10-CM | POA: Diagnosis not present

## 2021-11-12 DIAGNOSIS — R404 Transient alteration of awareness: Secondary | ICD-10-CM | POA: Diagnosis not present

## 2021-11-13 ENCOUNTER — Other Ambulatory Visit: Payer: Medicare Other | Admitting: *Deleted

## 2021-11-18 DIAGNOSIS — F0390 Unspecified dementia without behavioral disturbance: Secondary | ICD-10-CM | POA: Diagnosis not present

## 2021-11-18 DIAGNOSIS — R32 Unspecified urinary incontinence: Secondary | ICD-10-CM | POA: Diagnosis not present

## 2021-11-18 DIAGNOSIS — R39191 Need to immediately re-void: Secondary | ICD-10-CM | POA: Diagnosis not present

## 2021-11-27 DIAGNOSIS — C329 Malignant neoplasm of larynx, unspecified: Secondary | ICD-10-CM | POA: Diagnosis not present

## 2021-11-27 DIAGNOSIS — Z930 Tracheostomy status: Secondary | ICD-10-CM | POA: Diagnosis not present

## 2021-11-27 DIAGNOSIS — C321 Malignant neoplasm of supraglottis: Secondary | ICD-10-CM | POA: Diagnosis not present

## 2021-11-27 DIAGNOSIS — J449 Chronic obstructive pulmonary disease, unspecified: Secondary | ICD-10-CM | POA: Diagnosis not present

## 2021-12-09 DIAGNOSIS — C321 Malignant neoplasm of supraglottis: Secondary | ICD-10-CM | POA: Diagnosis not present

## 2021-12-09 DIAGNOSIS — J449 Chronic obstructive pulmonary disease, unspecified: Secondary | ICD-10-CM | POA: Diagnosis not present

## 2021-12-09 DIAGNOSIS — C329 Malignant neoplasm of larynx, unspecified: Secondary | ICD-10-CM | POA: Diagnosis not present

## 2021-12-09 DIAGNOSIS — R1310 Dysphagia, unspecified: Secondary | ICD-10-CM | POA: Diagnosis not present

## 2021-12-17 ENCOUNTER — Other Ambulatory Visit: Payer: Medicare Other | Admitting: *Deleted

## 2021-12-17 VITALS — BP 111/72 | HR 69 | Temp 97.7°F | Resp 18

## 2021-12-17 DIAGNOSIS — Z515 Encounter for palliative care: Secondary | ICD-10-CM

## 2022-01-07 DIAGNOSIS — H9191 Unspecified hearing loss, right ear: Secondary | ICD-10-CM | POA: Diagnosis not present

## 2022-01-07 DIAGNOSIS — Z743 Need for continuous supervision: Secondary | ICD-10-CM | POA: Diagnosis not present

## 2022-01-07 DIAGNOSIS — E7800 Pure hypercholesterolemia, unspecified: Secondary | ICD-10-CM | POA: Diagnosis not present

## 2022-01-07 DIAGNOSIS — Z7401 Bed confinement status: Secondary | ICD-10-CM | POA: Diagnosis not present

## 2022-01-07 DIAGNOSIS — R531 Weakness: Secondary | ICD-10-CM | POA: Diagnosis not present

## 2022-01-07 DIAGNOSIS — R21 Rash and other nonspecific skin eruption: Secondary | ICD-10-CM | POA: Diagnosis not present

## 2022-01-07 DIAGNOSIS — I10 Essential (primary) hypertension: Secondary | ICD-10-CM | POA: Diagnosis not present

## 2022-01-07 DIAGNOSIS — Z23 Encounter for immunization: Secondary | ICD-10-CM | POA: Diagnosis not present

## 2022-01-09 DIAGNOSIS — C321 Malignant neoplasm of supraglottis: Secondary | ICD-10-CM | POA: Diagnosis not present

## 2022-01-09 DIAGNOSIS — J449 Chronic obstructive pulmonary disease, unspecified: Secondary | ICD-10-CM | POA: Diagnosis not present

## 2022-01-09 DIAGNOSIS — C329 Malignant neoplasm of larynx, unspecified: Secondary | ICD-10-CM | POA: Diagnosis not present

## 2022-01-09 DIAGNOSIS — R1310 Dysphagia, unspecified: Secondary | ICD-10-CM | POA: Diagnosis not present

## 2022-01-23 DIAGNOSIS — R531 Weakness: Secondary | ICD-10-CM | POA: Diagnosis not present

## 2022-01-23 DIAGNOSIS — R944 Abnormal results of kidney function studies: Secondary | ICD-10-CM | POA: Diagnosis not present

## 2022-01-23 DIAGNOSIS — F418 Other specified anxiety disorders: Secondary | ICD-10-CM | POA: Diagnosis not present

## 2022-01-23 DIAGNOSIS — Z743 Need for continuous supervision: Secondary | ICD-10-CM | POA: Diagnosis not present

## 2022-01-31 NOTE — Progress Notes (Signed)
[facility] RN NOTE  PATIENT NAME: Katelyn [name] DOB: 08-Mar-1950 MRN: [id number]  PRIMARY CARE PROVIDER: Aretta Nip, MD  RESPONSIBLE PARTY: Katelyn [name] (daughter) Acct ID - Guarantor Home Phone Work Phone Relationship Acct Type  [id number] MAAHI, [id number]  Self P/F     [address], [place], [place] [address]   RN follow up visit completed with patient in her home. Daughter Katelyn [name] present during visit.  Pain: Denies pain or discomfort at this time.  Respiratory: Does experience some shortness of breath at times with exertion and at rest. Wears oxygen at night occasionally. Respirations even, regular and unlabored during visit. She has a trach. Significant other cleanses daily. Has some anxiety. Currently taking Escitalopram but patient feels it is not working. Clarified that daughter has only be placing 5 mg tablets in her pill box but should have increased to 10 mg after the first week. She will place correct dose in pill box.   Cardiovascular: BP has improved and she is taking medications as prescribed.  Mobility: Requires extensive assistance with all ADLs. Transported via wheelchair. Able to feed herself.  Appetite: Eating 2 meals/day. Drinks 1 cup of coffee daily. Denies dysphagia.   Skin: Barrier cream applied to bottom daily for protection.  GI: LBM yesterday. Denies recent issues with constipation. Miralax available as needed.   Patient's condition is stable at this time. She is scheduled to see a new physician, Dr. Lurline Del next month. Educated patient and daughter regarding new criteria for in home palliative care services. Patient does not meet criteria and will be discharged.   PHYSICAL EXAM:   VITALS: Today's Vitals   12/17/21 1242  BP: 111/72  Pulse: 69  Resp: 18  Temp: 97.7 F (36.5 C)  TempSrc: Temporal  PainSc: 0-No pain      Daryl Eastern, RN BSN

## 2022-02-07 DIAGNOSIS — I129 Hypertensive chronic kidney disease with stage 1 through stage 4 chronic kidney disease, or unspecified chronic kidney disease: Secondary | ICD-10-CM | POA: Diagnosis not present

## 2022-02-07 DIAGNOSIS — Z85819 Personal history of malignant neoplasm of unspecified site of lip, oral cavity, and pharynx: Secondary | ICD-10-CM | POA: Diagnosis not present

## 2022-02-07 DIAGNOSIS — Z743 Need for continuous supervision: Secondary | ICD-10-CM | POA: Diagnosis not present

## 2022-02-07 DIAGNOSIS — R531 Weakness: Secondary | ICD-10-CM | POA: Diagnosis not present

## 2022-02-07 DIAGNOSIS — Z7401 Bed confinement status: Secondary | ICD-10-CM | POA: Diagnosis not present

## 2022-02-07 DIAGNOSIS — N179 Acute kidney failure, unspecified: Secondary | ICD-10-CM | POA: Diagnosis not present

## 2022-02-08 DIAGNOSIS — C329 Malignant neoplasm of larynx, unspecified: Secondary | ICD-10-CM | POA: Diagnosis not present

## 2022-02-08 DIAGNOSIS — R1310 Dysphagia, unspecified: Secondary | ICD-10-CM | POA: Diagnosis not present

## 2022-02-08 DIAGNOSIS — J449 Chronic obstructive pulmonary disease, unspecified: Secondary | ICD-10-CM | POA: Diagnosis not present

## 2022-02-08 DIAGNOSIS — C321 Malignant neoplasm of supraglottis: Secondary | ICD-10-CM | POA: Diagnosis not present

## 2022-03-11 DIAGNOSIS — J449 Chronic obstructive pulmonary disease, unspecified: Secondary | ICD-10-CM | POA: Diagnosis not present

## 2022-03-11 DIAGNOSIS — C329 Malignant neoplasm of larynx, unspecified: Secondary | ICD-10-CM | POA: Diagnosis not present

## 2022-03-11 DIAGNOSIS — R1310 Dysphagia, unspecified: Secondary | ICD-10-CM | POA: Diagnosis not present

## 2022-03-11 DIAGNOSIS — C321 Malignant neoplasm of supraglottis: Secondary | ICD-10-CM | POA: Diagnosis not present

## 2022-03-12 DIAGNOSIS — R4702 Dysphasia: Secondary | ICD-10-CM | POA: Diagnosis not present

## 2022-03-12 DIAGNOSIS — I10 Essential (primary) hypertension: Secondary | ICD-10-CM | POA: Diagnosis not present

## 2022-03-12 DIAGNOSIS — Z743 Need for continuous supervision: Secondary | ICD-10-CM | POA: Diagnosis not present

## 2022-04-11 DIAGNOSIS — C329 Malignant neoplasm of larynx, unspecified: Secondary | ICD-10-CM | POA: Diagnosis not present

## 2022-04-11 DIAGNOSIS — J449 Chronic obstructive pulmonary disease, unspecified: Secondary | ICD-10-CM | POA: Diagnosis not present

## 2022-04-11 DIAGNOSIS — R1310 Dysphagia, unspecified: Secondary | ICD-10-CM | POA: Diagnosis not present

## 2022-04-11 DIAGNOSIS — C321 Malignant neoplasm of supraglottis: Secondary | ICD-10-CM | POA: Diagnosis not present

## 2022-04-18 DIAGNOSIS — C329 Malignant neoplasm of larynx, unspecified: Secondary | ICD-10-CM | POA: Diagnosis not present

## 2022-04-18 DIAGNOSIS — J449 Chronic obstructive pulmonary disease, unspecified: Secondary | ICD-10-CM | POA: Diagnosis not present

## 2022-04-18 DIAGNOSIS — Z930 Tracheostomy status: Secondary | ICD-10-CM | POA: Diagnosis not present

## 2022-04-18 DIAGNOSIS — C321 Malignant neoplasm of supraglottis: Secondary | ICD-10-CM | POA: Diagnosis not present

## 2022-05-10 DIAGNOSIS — J449 Chronic obstructive pulmonary disease, unspecified: Secondary | ICD-10-CM | POA: Diagnosis not present

## 2022-05-10 DIAGNOSIS — C321 Malignant neoplasm of supraglottis: Secondary | ICD-10-CM | POA: Diagnosis not present

## 2022-05-10 DIAGNOSIS — C329 Malignant neoplasm of larynx, unspecified: Secondary | ICD-10-CM | POA: Diagnosis not present

## 2022-05-10 DIAGNOSIS — R1310 Dysphagia, unspecified: Secondary | ICD-10-CM | POA: Diagnosis not present

## 2022-05-13 DIAGNOSIS — Z85819 Personal history of malignant neoplasm of unspecified site of lip, oral cavity, and pharynx: Secondary | ICD-10-CM | POA: Diagnosis not present

## 2022-05-13 DIAGNOSIS — I129 Hypertensive chronic kidney disease with stage 1 through stage 4 chronic kidney disease, or unspecified chronic kidney disease: Secondary | ICD-10-CM | POA: Diagnosis not present

## 2022-05-13 DIAGNOSIS — R531 Weakness: Secondary | ICD-10-CM | POA: Diagnosis not present

## 2022-05-13 DIAGNOSIS — Z7401 Bed confinement status: Secondary | ICD-10-CM | POA: Diagnosis not present

## 2022-05-13 DIAGNOSIS — N179 Acute kidney failure, unspecified: Secondary | ICD-10-CM | POA: Diagnosis not present

## 2022-05-27 DIAGNOSIS — K5900 Constipation, unspecified: Secondary | ICD-10-CM | POA: Diagnosis not present

## 2022-06-10 DIAGNOSIS — J449 Chronic obstructive pulmonary disease, unspecified: Secondary | ICD-10-CM | POA: Diagnosis not present

## 2022-06-10 DIAGNOSIS — C329 Malignant neoplasm of larynx, unspecified: Secondary | ICD-10-CM | POA: Diagnosis not present

## 2022-06-10 DIAGNOSIS — C321 Malignant neoplasm of supraglottis: Secondary | ICD-10-CM | POA: Diagnosis not present

## 2022-06-10 DIAGNOSIS — R1310 Dysphagia, unspecified: Secondary | ICD-10-CM | POA: Diagnosis not present

## 2022-06-22 DIAGNOSIS — J449 Chronic obstructive pulmonary disease, unspecified: Secondary | ICD-10-CM | POA: Diagnosis not present

## 2022-06-22 DIAGNOSIS — C321 Malignant neoplasm of supraglottis: Secondary | ICD-10-CM | POA: Diagnosis not present

## 2022-06-22 DIAGNOSIS — C329 Malignant neoplasm of larynx, unspecified: Secondary | ICD-10-CM | POA: Diagnosis not present

## 2022-06-22 DIAGNOSIS — Z930 Tracheostomy status: Secondary | ICD-10-CM | POA: Diagnosis not present

## 2022-06-23 DIAGNOSIS — C329 Malignant neoplasm of larynx, unspecified: Secondary | ICD-10-CM | POA: Diagnosis not present

## 2022-06-23 DIAGNOSIS — Z930 Tracheostomy status: Secondary | ICD-10-CM | POA: Diagnosis not present

## 2022-06-23 DIAGNOSIS — C321 Malignant neoplasm of supraglottis: Secondary | ICD-10-CM | POA: Diagnosis not present

## 2022-06-23 DIAGNOSIS — J449 Chronic obstructive pulmonary disease, unspecified: Secondary | ICD-10-CM | POA: Diagnosis not present

## 2022-07-09 DIAGNOSIS — J449 Chronic obstructive pulmonary disease, unspecified: Secondary | ICD-10-CM | POA: Diagnosis not present

## 2022-07-09 DIAGNOSIS — Z0000 Encounter for general adult medical examination without abnormal findings: Secondary | ICD-10-CM | POA: Diagnosis not present

## 2022-07-09 DIAGNOSIS — D6869 Other thrombophilia: Secondary | ICD-10-CM | POA: Diagnosis not present

## 2022-07-09 DIAGNOSIS — Z7401 Bed confinement status: Secondary | ICD-10-CM | POA: Diagnosis not present

## 2022-07-09 DIAGNOSIS — N1832 Chronic kidney disease, stage 3b: Secondary | ICD-10-CM | POA: Diagnosis not present

## 2022-07-09 DIAGNOSIS — E7800 Pure hypercholesterolemia, unspecified: Secondary | ICD-10-CM | POA: Diagnosis not present

## 2022-07-09 DIAGNOSIS — R6889 Other general symptoms and signs: Secondary | ICD-10-CM | POA: Diagnosis not present

## 2022-07-09 DIAGNOSIS — R404 Transient alteration of awareness: Secondary | ICD-10-CM | POA: Diagnosis not present

## 2022-07-09 DIAGNOSIS — I10 Essential (primary) hypertension: Secondary | ICD-10-CM | POA: Diagnosis not present

## 2022-07-09 DIAGNOSIS — Z743 Need for continuous supervision: Secondary | ICD-10-CM | POA: Diagnosis not present

## 2022-07-09 DIAGNOSIS — J9611 Chronic respiratory failure with hypoxia: Secondary | ICD-10-CM | POA: Diagnosis not present

## 2022-07-09 DIAGNOSIS — Z86718 Personal history of other venous thrombosis and embolism: Secondary | ICD-10-CM | POA: Diagnosis not present

## 2022-07-10 DIAGNOSIS — R1310 Dysphagia, unspecified: Secondary | ICD-10-CM | POA: Diagnosis not present

## 2022-07-10 DIAGNOSIS — J449 Chronic obstructive pulmonary disease, unspecified: Secondary | ICD-10-CM | POA: Diagnosis not present

## 2022-07-10 DIAGNOSIS — C321 Malignant neoplasm of supraglottis: Secondary | ICD-10-CM | POA: Diagnosis not present

## 2022-07-10 DIAGNOSIS — C329 Malignant neoplasm of larynx, unspecified: Secondary | ICD-10-CM | POA: Diagnosis not present

## 2022-08-05 DIAGNOSIS — I10 Essential (primary) hypertension: Secondary | ICD-10-CM | POA: Diagnosis not present

## 2022-08-05 DIAGNOSIS — E7800 Pure hypercholesterolemia, unspecified: Secondary | ICD-10-CM | POA: Diagnosis not present

## 2022-08-10 DIAGNOSIS — R1310 Dysphagia, unspecified: Secondary | ICD-10-CM | POA: Diagnosis not present

## 2022-08-10 DIAGNOSIS — J449 Chronic obstructive pulmonary disease, unspecified: Secondary | ICD-10-CM | POA: Diagnosis not present

## 2022-08-10 DIAGNOSIS — C329 Malignant neoplasm of larynx, unspecified: Secondary | ICD-10-CM | POA: Diagnosis not present

## 2022-08-10 DIAGNOSIS — C321 Malignant neoplasm of supraglottis: Secondary | ICD-10-CM | POA: Diagnosis not present

## 2022-09-09 DIAGNOSIS — C329 Malignant neoplasm of larynx, unspecified: Secondary | ICD-10-CM | POA: Diagnosis not present

## 2022-09-09 DIAGNOSIS — C321 Malignant neoplasm of supraglottis: Secondary | ICD-10-CM | POA: Diagnosis not present

## 2022-09-09 DIAGNOSIS — J449 Chronic obstructive pulmonary disease, unspecified: Secondary | ICD-10-CM | POA: Diagnosis not present

## 2022-09-09 DIAGNOSIS — R1310 Dysphagia, unspecified: Secondary | ICD-10-CM | POA: Diagnosis not present

## 2022-10-10 DIAGNOSIS — R1310 Dysphagia, unspecified: Secondary | ICD-10-CM | POA: Diagnosis not present

## 2022-10-10 DIAGNOSIS — C321 Malignant neoplasm of supraglottis: Secondary | ICD-10-CM | POA: Diagnosis not present

## 2022-10-10 DIAGNOSIS — C329 Malignant neoplasm of larynx, unspecified: Secondary | ICD-10-CM | POA: Diagnosis not present

## 2022-10-10 DIAGNOSIS — J449 Chronic obstructive pulmonary disease, unspecified: Secondary | ICD-10-CM | POA: Diagnosis not present

## 2022-10-16 ENCOUNTER — Telehealth: Payer: Self-pay | Admitting: Pulmonary Disease

## 2022-10-20 ENCOUNTER — Encounter: Payer: Self-pay | Admitting: Internal Medicine

## 2022-10-20 ENCOUNTER — Ambulatory Visit (INDEPENDENT_AMBULATORY_CARE_PROVIDER_SITE_OTHER): Payer: Medicare HMO | Admitting: Internal Medicine

## 2022-10-20 VITALS — BP 112/72 | HR 62 | Temp 98.2°F

## 2022-10-20 DIAGNOSIS — I509 Heart failure, unspecified: Secondary | ICD-10-CM | POA: Diagnosis not present

## 2022-10-20 DIAGNOSIS — R053 Chronic cough: Secondary | ICD-10-CM

## 2022-10-20 DIAGNOSIS — Z85819 Personal history of malignant neoplasm of unspecified site of lip, oral cavity, and pharynx: Secondary | ICD-10-CM

## 2022-10-20 DIAGNOSIS — Z430 Encounter for attention to tracheostomy: Secondary | ICD-10-CM | POA: Diagnosis not present

## 2022-10-20 NOTE — Patient Instructions (Addendum)
ICD-10-CM   1. Tracheostomy care (HCC)  Z43.0     2. Chronic cough  R05.3     3. History of throat cancer  Z85.819       Overall stable  Plan - Will do tracheostomy supplies today - Get CT scan of the chest without contrast - Throat cancer history to be followed by Katelyn [name] at [facility] and per his instructions  Follow-up - Katelyn [name] tracheostomy clinic nurse practitioner in 4 weeks - Reestablished with Katelyn [name] in 6 months  -He is your primary pulmonologist

## 2022-10-20 NOTE — Progress Notes (Signed)
OV Oct 2021   Chief Complaint  Patient presents with   Hospitalization Follow-up    Katelyn [name] is a 72 year old woman, former smoker with history of laryngeal cancer (diagnosed in 2015 but patient did not seek treatment til 2019) status post laryngectomy and tracheostomy, hypertension, hyperlipidemia, right leg DVT, and stroke who comes to clinic for hospital follow up.   She has admitted multiple times recently over the months of August and September, most recently being 9/30. She was found unresponsive the morning of 9/30 by her husband who reportedly tried to suction through her trach but was able to and her SpO2 was in the 30s when EMS arrive who then remove the trach and placed an ET tube through the stoma with resolution of her hypoxemia. Previously she came in with respiratory distress requiring endotracheal intubation through the stoma site. She was treated for MSSA pneumonia in September. Discharge planning was initially for an LTACH or SNF but patient and family agreed to be discharged home.   Since being home the patient has done well. She continues to have secretions requiring some suctioning. She is not using nebulizer treatments daily for pulmonary hygiene. She has been using 2-4 via trach collar at home.   She is taking eliquis therapy for the DVT and is asking if there is a way to get the medicine cheaper or change to a more affordable  OV 02/08/2020  Chief Complaint  Patient presents with   Follow-up    Pt's daughter states pt has been doing fine since last visit. Pt needs to know if the diagnosis of COPD was due to exposure of asbestos.   Katelyn [name] is a 72 year old woman, former smoker with history of laryngeal cancer (diagnosed in 2015 but patient did not seek treatment til 2019) status post laryngectomy and tracheostomy, hypertension, hyperlipidemia, right leg DVT, and stroke who returns to pulmonary clinic for follow up.   She has been doing well  since last visit 12/15/19. She has not had any hospital admissions since she was discharged on 11/26/19. She denies issues with expectorating mucous or secretions via her trach. She is taking robitussin DM on a daily basis. She has not picked up her albuterol nebulizer treatments yet for bronchial hygiene. She has weaned off trach collar supplemental oxygen.  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 Informed her that this doesn't seem likely as there are no signs of pleural plaques on her chest imaging or signs of insterstitial lung disease. Informed her that she has emphysematous changes on her CT scan which are related to her history of smoking.   OV 10/20/2022  Subjective:  Patient ID: Katelyn [name], female , DOB: 1950-09-02 , age 62 y.o. , MRN: [id number] , ADDRESS: [address] [place] [address] PCP Barbaraann Barthel, Fanny Dance, MD Patient Care Team: Clayborn Heron, MD as PCP - General Doran Heater, Glee Arvin, MD as Consulting Physician (Otolaryngology) Lonie Peak, MD as Attending Physician (Radiation Oncology) Daisy Blossom, MD (Inactive) as Consulting Physician (Hematology and Oncology) Barrie Folk, RN (Inactive) as Oncology Nurse Navigator (Oncology)  This Provider for this visit: Treatment Team:  Attending Provider: Kalman Shan, MD    10/20/2022 -   Chief Complaint  Patient presents with   Pulmonary Consult    Seen last here by Dr. Francine Graven for COPD and trach status in 2021. Needs renewal of trach supplies- [employer].  She has been doing a lot of coughing at night.      HPI Katelyn [name] 72 y.o. -technically new consult because it has been over 3 years since this patient was seen in our practice.  Patient is accompanied by her daughter Veva Holes.  There is also the ambulance driver who is with her.  History is gained from the daughter and also review of the medical record.  According to  the daughter tracheostomy placement in 2019 [some of the chart review/2015] for throat cancer.  She has a 6 Shiley.  She is not on ventilator.  According to the daughter she is bedbound because of hip issues and also left ankle fracture although she seems to have good mobility/power in her extremities.  But she remains to be bedbound.  She is not on the ventilator not on oxygen.  She apparently she supposed to be using a mist machine but she refuses.  She is supposed to be on oxygen but she refuses.  She is supposed to be using Passy-Muir valve but she refuses.  Patient lives with her daughter and her boyfriend.  Since the tracheostomy daughter reports course has been complicated by lung collapse x 2, pulmonary embolism, pneumonia.  They did follow-up with Dr. Melinda Crutch at [facility] for ENT.  That.  This is because the tracheostomy for throat cancer although it appears he was emergently placed in the ICU.  However the tracheostomy follow-up here was with Dr. Irena Cords.  They do not recall following with Dr. Manson Passey for tracheostomy.  There is a note from 2022 by Dr. Manson Passey attempted to do laryngoscopy but patient refused.  Patient is on expectant follow-up with him and therefore she has not followed up.  Currently they just morning supplies.  I offered patient establish with Anders Simmonds for tracheostomy clinic.  He is our Publishing rights manager.  They are open to this.  Also recommended they reestablish with Dr. Francine Graven is the managing physician.  I offered the choice of seeing ENT because the tracheostomy is for throat cancer purposes but they were not interested.   Is a complaint of chronic cough.  They are open to getting a CT scan of the chest done.  Last of blood labs and records from February 2023 with a creatinine of 0.9 mg percent and a hemoglobin of 12.5 g%  She did have a CT abdomen in February 2023: There is a small hiatal hernia and base of the lungs reported as normal.  PFT      No data to  display            LAB RESULTS last 96 hours No results found.  LAB RESULTS last 90 days No results found for this or any previous visit (from the past 2160 hour(s)).       has a past medical history of Acute gallstone pancreatitis (01/08/2014), AKI (acute kidney injury) (HCC) (01/08/2014), Anemia, Blood transfusion, Cancer (HCC), CHF (congestive heart failure) (HCC), Cholecystitis, COPD (chronic obstructive pulmonary disease) (HCC), H/O tracheostomy, Hip fracture (HCC), Hypercholesteremia, Hypertension, Right leg DVT (HCC) (05/08/2014), Septic shock - Kelbsiella (01/09/2014), Shortness of breath, and Stroke (HCC).   reports that she quit smoking about 5 years ago. Her smoking use included cigarettes. She started smoking about 53 years ago. She has a 144.8 pack-year smoking history. She has never used smokeless tobacco.  Past Surgical History:  Procedure Laterality Date   ABDOMINAL HYSTERECTOMY     DIRECT LARYNGOSCOPY N/A 06/08/2017   Procedure: DIRECT LARYNGOSCOPY  WITH BIOPSY;  Surgeon: Graylin Shiver, MD;  Location: [facility] OR;  Service: ENT;  Laterality: N/A;   ENDOSCOPIC RETROGRADE CHOLANGIOPANCREATOGRAPHY (ERCP) WITH PROPOFOL N/A 01/11/2014   Procedure: ENDOSCOPIC RETROGRADE CHOLANGIOPANCREATOGRAPHY (ERCP) WITH PROPOFOL;  Surgeon: Iva Boop, MD;  Location: [facility] ENDOSCOPY;  Service: Endoscopy;  Laterality: N/A;   ERCP     ERCP N/A 05/26/2014   Procedure: ENDOSCOPIC RETROGRADE CHOLANGIOPANCREATOGRAPHY (ERCP);  Surgeon: Iva Boop, MD;  Location: [facility] ENDOSCOPY;  Service: Endoscopy;  Laterality: N/A;   ESOPHAGOGASTRODUODENOSCOPY (EGD) WITH PROPOFOL N/A 12/07/2014   Procedure: ESOPHAGOGASTRODUODENOSCOPY (EGD) WITH PROPOFOL;  Surgeon: Rachael Fee, MD;  Location: [facility] ENDOSCOPY;  Service: Endoscopy;  Laterality: N/A;   EUS N/A 12/07/2014   Procedure: UPPER ENDOSCOPIC ULTRASOUND (EUS) LINEAR;  Surgeon: Rachael Fee, MD;  Location: [facility] ENDOSCOPY;  Service: Endoscopy;   Laterality: N/A;   GASTROINTESTINAL STENT REMOVAL N/A 12/07/2014   Procedure: GASTROINTESTINAL STENT REMOVAL;  Surgeon: Rachael Fee, MD;  Location: [facility] ENDOSCOPY;  Service: Endoscopy;  Laterality: N/A;  pancreatic stent removal   HIP FRACTURE SURGERY     INTRAMEDULLARY (IM) NAIL INTERTROCHANTERIC Right 05/29/2017   Procedure: INTRAMEDULLARY (IM) NAIL INTERTROCHANTRIC FEMUR;  Surgeon: Yolonda Kida, MD;  Location: [facility] OR;  Service: Orthopedics;  Laterality: Right;   PERCUTANEOUS TRACHEOSTOMY N/A 05/29/2017   Procedure: PERCUTANEOUS TRACHEOSTOMY;  Surgeon: Jimmye Norman, MD;  Location: [facility] OR;  Service: General;  Laterality: N/A;   TEE WITHOUT CARDIOVERSION  03/06/2011   Procedure: TRANSESOPHAGEAL ECHOCARDIOGRAM (TEE);  Surgeon: Dolores Patty, MD;  Location: [facility] ENDOSCOPY;  Service: Cardiovascular;  Laterality: N/A;    No Known Allergies  Immunization History  Administered Date(s) Administered   Fluad Quad(high Dose 65+) 11/26/2019   Moderna Sars-Covid-2 Vaccination 09/29/2019, 11/05/2019   Pneumococcal Polysaccharide-23 08/14/2017    Family History  Problem Relation Age of Onset   Breast cancer Maternal Aunt    Diabetes Mellitus I Father    Diabetes Mellitus I Sister    Anesthesia problems Neg Hx    Hypotension Neg Hx    Malignant hyperthermia Neg Hx    Pseudochol deficiency Neg Hx    Colon cancer Neg Hx    Esophageal cancer Neg Hx    Pancreatic cancer Neg Hx    Kidney disease Neg Hx    Liver disease Neg Hx      Current Outpatient Medications:    ALPRAZolam (XANAX) 0.5 MG tablet, Take 1 tablet (0.5 mg total) by mouth 3 (three) times daily as needed for anxiety., Disp: 20 tablet, Rfl: 0   apixaban (ELIQUIS) 5 MG TABS tablet, Take 1 tablet (5 mg total) by mouth 2 (two) times daily., Disp: 60 tablet, Rfl: 0   atorvastatin (LIPITOR) 80 MG tablet, Take 1 tablet (80 mg total) by mouth daily., Disp: 30 tablet, Rfl: 0   ezetimibe (ZETIA) 10 MG tablet, Take 10 mg by mouth  daily., Disp: , Rfl:    guaiFENesin-dextromethorphan (ROBITUSSIN DM) 100-10 MG/5ML syrup, Take 10 mLs by mouth every 6 (six) hours., Disp: 118 mL, Rfl: 0   hydrochlorothiazide (HYDRODIURIL) 12.5 MG tablet, Take 12.5 mg by mouth daily., Disp: , Rfl:    metoprolol tartrate (LOPRESSOR) 25 MG tablet, Take 1 tablet (25 mg total) by mouth 2 (two) times daily., Disp: 60 tablet, Rfl: 0   nystatin cream (MYCOSTATIN), Apply 1 application topically 2 (two) times daily., Disp: 30 g, Rfl: 0      Objective:   Vitals:   10/20/22 1147  BP: 112/72  Pulse:  62  Temp: 98.2 F (36.8 C)  TempSrc: Oral  SpO2: 92%    Estimated body mass index is 24.76 kg/m as calculated from the following:   Height as of 04/21/21: 5\' 3"  (1.6 m).   Weight as of 04/21/21: 139 lb 12.4 oz (63.4 kg).  @WEIGHTCHANGE @  There were no vitals filed for this visit.   Physical Exam   General: No distress. In stretcher with safety belts O2 at rest: no Cane present: no Sitting in wheel chair: no Frail: no Obese: no Neuro: Alert and Oriented x 3. GCS 15. Speech muffled as via trach. HAs 3/5 - 4/5 overall extremity strength Psych: Pleasant Resp:  Barrel Chest - no.  Wheeze - no, Crackles - no, No overt respiratory distress CVS: Normal heart sounds. Murmurs - no Ext: Stigmata of Connective Tissue Disease - no HEENT: Normal upper airway. PEERL +. No post nasal drip. TRACH + SIZE 6 SHILEY        Assessment:       ICD-10-CM   1. Tracheostomy care [facility])  Z43.0 Ambulatory Referral for DME    CT Chest Wo Contrast    2. Chronic cough  R05.3 CT Chest Wo Contrast    3. History of throat cancer  Z85.819 CT Chest Wo Contrast         Plan:     Patient Instructions     ICD-10-CM   1. Tracheostomy care (HCC)  Z43.0     2. Chronic cough  R05.3     3. History of throat cancer  Z85.819       Overall stable  Plan - Will do tracheostomy supplies today - Get CT scan of the chest without contrast - Throat cancer  history to be followed by Dr. Frederich Cha at [facility] and per his instructions  Follow-up - Anders Simmonds tracheostomy clinic nurse practitioner in 4 weeks - Reestablished with Dr. Melody Comas in 6 months  -He is your primary pulmonologist   FOLLOWUP Return in about 4 weeks (around 11/17/2022) for with Gaetano Hawthorne and 6 months with Dr Francine Graven.    SIGNATURE    Dr. Kalman Shan, M.D., F.C.C.P,  Pulmonary and Critical Care Medicine Staff Physician, [facility] Center Director - Interstitial Lung Disease  Program  Pulmonary Fibrosis [facility] at [facility] [place], [place], [address]  Pager: [phone], If no answer or between  15:00h - 7:00h: call [phone] Telephone: [phone]  4:15 PM 10/20/2022

## 2022-10-21 ENCOUNTER — Encounter: Payer: Self-pay | Admitting: Internal Medicine

## 2022-11-05 ENCOUNTER — Other Ambulatory Visit: Payer: Medicare HMO

## 2022-11-07 ENCOUNTER — Ambulatory Visit
Admission: RE | Admit: 2022-11-07 | Discharge: 2022-11-07 | Disposition: A | Discharge status: Home / Self Care | Payer: Medicare HMO | Source: Ambulatory Visit | Attending: Internal Medicine | Admitting: Internal Medicine

## 2022-11-07 DIAGNOSIS — R6889 Other general symptoms and signs: Secondary | ICD-10-CM | POA: Diagnosis not present

## 2022-11-07 DIAGNOSIS — J439 Emphysema, unspecified: Secondary | ICD-10-CM | POA: Diagnosis not present

## 2022-11-07 DIAGNOSIS — Z430 Encounter for attention to tracheostomy: Secondary | ICD-10-CM

## 2022-11-07 DIAGNOSIS — I700 Atherosclerosis of aorta: Secondary | ICD-10-CM | POA: Diagnosis not present

## 2022-11-07 DIAGNOSIS — R531 Weakness: Secondary | ICD-10-CM | POA: Diagnosis not present

## 2022-11-07 DIAGNOSIS — R053 Chronic cough: Secondary | ICD-10-CM

## 2022-11-07 DIAGNOSIS — Z743 Need for continuous supervision: Secondary | ICD-10-CM | POA: Diagnosis not present

## 2022-11-07 DIAGNOSIS — R911 Solitary pulmonary nodule: Secondary | ICD-10-CM | POA: Diagnosis not present

## 2022-11-07 DIAGNOSIS — Z85819 Personal history of malignant neoplasm of unspecified site of lip, oral cavity, and pharynx: Secondary | ICD-10-CM

## 2022-11-11 DIAGNOSIS — Z789 Other specified health status: Secondary | ICD-10-CM | POA: Diagnosis not present

## 2022-11-11 DIAGNOSIS — I10 Essential (primary) hypertension: Secondary | ICD-10-CM | POA: Diagnosis not present

## 2022-11-11 DIAGNOSIS — I6930 Unspecified sequelae of cerebral infarction: Secondary | ICD-10-CM | POA: Diagnosis not present

## 2022-11-11 DIAGNOSIS — E7800 Pure hypercholesterolemia, unspecified: Secondary | ICD-10-CM | POA: Diagnosis not present

## 2022-11-11 DIAGNOSIS — Z7401 Bed confinement status: Secondary | ICD-10-CM | POA: Diagnosis not present

## 2022-11-11 DIAGNOSIS — N1832 Chronic kidney disease, stage 3b: Secondary | ICD-10-CM | POA: Diagnosis not present

## 2022-11-11 DIAGNOSIS — G8190 Hemiplegia, unspecified affecting unspecified side: Secondary | ICD-10-CM | POA: Diagnosis not present

## 2022-11-11 DIAGNOSIS — K5900 Constipation, unspecified: Secondary | ICD-10-CM | POA: Diagnosis not present

## 2022-11-11 DIAGNOSIS — Z743 Need for continuous supervision: Secondary | ICD-10-CM | POA: Diagnosis not present

## 2022-11-11 DIAGNOSIS — Z7409 Other reduced mobility: Secondary | ICD-10-CM | POA: Diagnosis not present

## 2022-11-11 DIAGNOSIS — Z23 Encounter for immunization: Secondary | ICD-10-CM | POA: Diagnosis not present

## 2023-01-13 DIAGNOSIS — I69393 Ataxia following cerebral infarction: Secondary | ICD-10-CM | POA: Diagnosis not present

## 2023-01-13 DIAGNOSIS — G629 Polyneuropathy, unspecified: Secondary | ICD-10-CM | POA: Diagnosis not present

## 2023-01-13 DIAGNOSIS — Z7901 Long term (current) use of anticoagulants: Secondary | ICD-10-CM | POA: Diagnosis not present

## 2023-01-13 DIAGNOSIS — K5900 Constipation, unspecified: Secondary | ICD-10-CM | POA: Diagnosis not present

## 2023-01-13 DIAGNOSIS — J449 Chronic obstructive pulmonary disease, unspecified: Secondary | ICD-10-CM | POA: Diagnosis not present

## 2023-01-13 DIAGNOSIS — Z7401 Bed confinement status: Secondary | ICD-10-CM | POA: Diagnosis not present

## 2023-01-13 DIAGNOSIS — I130 Hypertensive heart and chronic kidney disease with heart failure and stage 1 through stage 4 chronic kidney disease, or unspecified chronic kidney disease: Secondary | ICD-10-CM | POA: Diagnosis not present

## 2023-01-13 DIAGNOSIS — N1832 Chronic kidney disease, stage 3b: Secondary | ICD-10-CM | POA: Diagnosis not present

## 2023-01-13 DIAGNOSIS — I509 Heart failure, unspecified: Secondary | ICD-10-CM | POA: Diagnosis not present

## 2023-01-23 DIAGNOSIS — Z7401 Bed confinement status: Secondary | ICD-10-CM | POA: Diagnosis not present

## 2023-01-23 DIAGNOSIS — I69393 Ataxia following cerebral infarction: Secondary | ICD-10-CM | POA: Diagnosis not present

## 2023-01-23 DIAGNOSIS — G629 Polyneuropathy, unspecified: Secondary | ICD-10-CM | POA: Diagnosis not present

## 2023-01-23 DIAGNOSIS — K5900 Constipation, unspecified: Secondary | ICD-10-CM | POA: Diagnosis not present

## 2023-01-23 DIAGNOSIS — N1832 Chronic kidney disease, stage 3b: Secondary | ICD-10-CM | POA: Diagnosis not present

## 2023-01-23 DIAGNOSIS — J449 Chronic obstructive pulmonary disease, unspecified: Secondary | ICD-10-CM | POA: Diagnosis not present

## 2023-01-23 DIAGNOSIS — I509 Heart failure, unspecified: Secondary | ICD-10-CM | POA: Diagnosis not present

## 2023-01-23 DIAGNOSIS — I130 Hypertensive heart and chronic kidney disease with heart failure and stage 1 through stage 4 chronic kidney disease, or unspecified chronic kidney disease: Secondary | ICD-10-CM | POA: Diagnosis not present

## 2023-01-23 DIAGNOSIS — Z7901 Long term (current) use of anticoagulants: Secondary | ICD-10-CM | POA: Diagnosis not present

## 2023-01-27 DIAGNOSIS — I130 Hypertensive heart and chronic kidney disease with heart failure and stage 1 through stage 4 chronic kidney disease, or unspecified chronic kidney disease: Secondary | ICD-10-CM | POA: Diagnosis not present

## 2023-01-27 DIAGNOSIS — K5900 Constipation, unspecified: Secondary | ICD-10-CM | POA: Diagnosis not present

## 2023-01-27 DIAGNOSIS — Z7901 Long term (current) use of anticoagulants: Secondary | ICD-10-CM | POA: Diagnosis not present

## 2023-01-27 DIAGNOSIS — I69393 Ataxia following cerebral infarction: Secondary | ICD-10-CM | POA: Diagnosis not present

## 2023-01-27 DIAGNOSIS — J449 Chronic obstructive pulmonary disease, unspecified: Secondary | ICD-10-CM | POA: Diagnosis not present

## 2023-01-27 DIAGNOSIS — N1832 Chronic kidney disease, stage 3b: Secondary | ICD-10-CM | POA: Diagnosis not present

## 2023-01-27 DIAGNOSIS — Z7401 Bed confinement status: Secondary | ICD-10-CM | POA: Diagnosis not present

## 2023-01-27 DIAGNOSIS — I509 Heart failure, unspecified: Secondary | ICD-10-CM | POA: Diagnosis not present

## 2023-01-27 DIAGNOSIS — G629 Polyneuropathy, unspecified: Secondary | ICD-10-CM | POA: Diagnosis not present

## 2023-01-29 DIAGNOSIS — I509 Heart failure, unspecified: Secondary | ICD-10-CM | POA: Diagnosis not present

## 2023-01-29 DIAGNOSIS — G629 Polyneuropathy, unspecified: Secondary | ICD-10-CM | POA: Diagnosis not present

## 2023-01-29 DIAGNOSIS — Z7401 Bed confinement status: Secondary | ICD-10-CM | POA: Diagnosis not present

## 2023-01-29 DIAGNOSIS — Z7901 Long term (current) use of anticoagulants: Secondary | ICD-10-CM | POA: Diagnosis not present

## 2023-01-29 DIAGNOSIS — K5900 Constipation, unspecified: Secondary | ICD-10-CM | POA: Diagnosis not present

## 2023-01-29 DIAGNOSIS — N1832 Chronic kidney disease, stage 3b: Secondary | ICD-10-CM | POA: Diagnosis not present

## 2023-01-29 DIAGNOSIS — I130 Hypertensive heart and chronic kidney disease with heart failure and stage 1 through stage 4 chronic kidney disease, or unspecified chronic kidney disease: Secondary | ICD-10-CM | POA: Diagnosis not present

## 2023-01-29 DIAGNOSIS — I69393 Ataxia following cerebral infarction: Secondary | ICD-10-CM | POA: Diagnosis not present

## 2023-01-29 DIAGNOSIS — J449 Chronic obstructive pulmonary disease, unspecified: Secondary | ICD-10-CM | POA: Diagnosis not present

## 2023-02-05 DIAGNOSIS — I130 Hypertensive heart and chronic kidney disease with heart failure and stage 1 through stage 4 chronic kidney disease, or unspecified chronic kidney disease: Secondary | ICD-10-CM | POA: Diagnosis not present

## 2023-02-05 DIAGNOSIS — I509 Heart failure, unspecified: Secondary | ICD-10-CM | POA: Diagnosis not present

## 2023-02-05 DIAGNOSIS — K5900 Constipation, unspecified: Secondary | ICD-10-CM | POA: Diagnosis not present

## 2023-02-05 DIAGNOSIS — Z7401 Bed confinement status: Secondary | ICD-10-CM | POA: Diagnosis not present

## 2023-02-05 DIAGNOSIS — G629 Polyneuropathy, unspecified: Secondary | ICD-10-CM | POA: Diagnosis not present

## 2023-02-05 DIAGNOSIS — Z7901 Long term (current) use of anticoagulants: Secondary | ICD-10-CM | POA: Diagnosis not present

## 2023-02-05 DIAGNOSIS — J449 Chronic obstructive pulmonary disease, unspecified: Secondary | ICD-10-CM | POA: Diagnosis not present

## 2023-02-05 DIAGNOSIS — N1832 Chronic kidney disease, stage 3b: Secondary | ICD-10-CM | POA: Diagnosis not present

## 2023-02-05 DIAGNOSIS — I69393 Ataxia following cerebral infarction: Secondary | ICD-10-CM | POA: Diagnosis not present

## 2023-02-11 DIAGNOSIS — I69393 Ataxia following cerebral infarction: Secondary | ICD-10-CM | POA: Diagnosis not present

## 2023-02-11 DIAGNOSIS — I509 Heart failure, unspecified: Secondary | ICD-10-CM | POA: Diagnosis not present

## 2023-02-11 DIAGNOSIS — Z7401 Bed confinement status: Secondary | ICD-10-CM | POA: Diagnosis not present

## 2023-02-11 DIAGNOSIS — Z7901 Long term (current) use of anticoagulants: Secondary | ICD-10-CM | POA: Diagnosis not present

## 2023-02-11 DIAGNOSIS — K5900 Constipation, unspecified: Secondary | ICD-10-CM | POA: Diagnosis not present

## 2023-02-11 DIAGNOSIS — J449 Chronic obstructive pulmonary disease, unspecified: Secondary | ICD-10-CM | POA: Diagnosis not present

## 2023-02-11 DIAGNOSIS — I130 Hypertensive heart and chronic kidney disease with heart failure and stage 1 through stage 4 chronic kidney disease, or unspecified chronic kidney disease: Secondary | ICD-10-CM | POA: Diagnosis not present

## 2023-02-11 DIAGNOSIS — N1832 Chronic kidney disease, stage 3b: Secondary | ICD-10-CM | POA: Diagnosis not present

## 2023-02-11 DIAGNOSIS — G629 Polyneuropathy, unspecified: Secondary | ICD-10-CM | POA: Diagnosis not present

## 2023-02-12 DIAGNOSIS — J449 Chronic obstructive pulmonary disease, unspecified: Secondary | ICD-10-CM | POA: Diagnosis not present

## 2023-02-12 DIAGNOSIS — Z7401 Bed confinement status: Secondary | ICD-10-CM | POA: Diagnosis not present

## 2023-02-12 DIAGNOSIS — G629 Polyneuropathy, unspecified: Secondary | ICD-10-CM | POA: Diagnosis not present

## 2023-02-12 DIAGNOSIS — K5900 Constipation, unspecified: Secondary | ICD-10-CM | POA: Diagnosis not present

## 2023-02-12 DIAGNOSIS — I130 Hypertensive heart and chronic kidney disease with heart failure and stage 1 through stage 4 chronic kidney disease, or unspecified chronic kidney disease: Secondary | ICD-10-CM | POA: Diagnosis not present

## 2023-02-12 DIAGNOSIS — Z7901 Long term (current) use of anticoagulants: Secondary | ICD-10-CM | POA: Diagnosis not present

## 2023-02-12 DIAGNOSIS — I509 Heart failure, unspecified: Secondary | ICD-10-CM | POA: Diagnosis not present

## 2023-02-12 DIAGNOSIS — N1832 Chronic kidney disease, stage 3b: Secondary | ICD-10-CM | POA: Diagnosis not present

## 2023-02-12 DIAGNOSIS — I69393 Ataxia following cerebral infarction: Secondary | ICD-10-CM | POA: Diagnosis not present

## 2023-03-03 DIAGNOSIS — I69393 Ataxia following cerebral infarction: Secondary | ICD-10-CM | POA: Diagnosis not present

## 2023-03-03 DIAGNOSIS — I509 Heart failure, unspecified: Secondary | ICD-10-CM | POA: Diagnosis not present

## 2023-03-03 DIAGNOSIS — J449 Chronic obstructive pulmonary disease, unspecified: Secondary | ICD-10-CM | POA: Diagnosis not present

## 2023-03-03 DIAGNOSIS — Z7901 Long term (current) use of anticoagulants: Secondary | ICD-10-CM | POA: Diagnosis not present

## 2023-03-03 DIAGNOSIS — G629 Polyneuropathy, unspecified: Secondary | ICD-10-CM | POA: Diagnosis not present

## 2023-03-03 DIAGNOSIS — Z7401 Bed confinement status: Secondary | ICD-10-CM | POA: Diagnosis not present

## 2023-03-03 DIAGNOSIS — K5900 Constipation, unspecified: Secondary | ICD-10-CM | POA: Diagnosis not present

## 2023-03-03 DIAGNOSIS — I130 Hypertensive heart and chronic kidney disease with heart failure and stage 1 through stage 4 chronic kidney disease, or unspecified chronic kidney disease: Secondary | ICD-10-CM | POA: Diagnosis not present

## 2023-03-03 DIAGNOSIS — N1832 Chronic kidney disease, stage 3b: Secondary | ICD-10-CM | POA: Diagnosis not present

## 2023-03-11 DIAGNOSIS — I509 Heart failure, unspecified: Secondary | ICD-10-CM | POA: Diagnosis not present

## 2023-03-11 DIAGNOSIS — G629 Polyneuropathy, unspecified: Secondary | ICD-10-CM | POA: Diagnosis not present

## 2023-03-11 DIAGNOSIS — Z7401 Bed confinement status: Secondary | ICD-10-CM | POA: Diagnosis not present

## 2023-03-11 DIAGNOSIS — I130 Hypertensive heart and chronic kidney disease with heart failure and stage 1 through stage 4 chronic kidney disease, or unspecified chronic kidney disease: Secondary | ICD-10-CM | POA: Diagnosis not present

## 2023-03-11 DIAGNOSIS — I69393 Ataxia following cerebral infarction: Secondary | ICD-10-CM | POA: Diagnosis not present

## 2023-03-11 DIAGNOSIS — K5900 Constipation, unspecified: Secondary | ICD-10-CM | POA: Diagnosis not present

## 2023-03-11 DIAGNOSIS — J449 Chronic obstructive pulmonary disease, unspecified: Secondary | ICD-10-CM | POA: Diagnosis not present

## 2023-03-11 DIAGNOSIS — N1832 Chronic kidney disease, stage 3b: Secondary | ICD-10-CM | POA: Diagnosis not present

## 2023-03-11 DIAGNOSIS — Z7901 Long term (current) use of anticoagulants: Secondary | ICD-10-CM | POA: Diagnosis not present

## 2023-03-19 DIAGNOSIS — I130 Hypertensive heart and chronic kidney disease with heart failure and stage 1 through stage 4 chronic kidney disease, or unspecified chronic kidney disease: Secondary | ICD-10-CM | POA: Diagnosis not present

## 2023-03-19 DIAGNOSIS — Z7901 Long term (current) use of anticoagulants: Secondary | ICD-10-CM | POA: Diagnosis not present

## 2023-03-19 DIAGNOSIS — G629 Polyneuropathy, unspecified: Secondary | ICD-10-CM | POA: Diagnosis not present

## 2023-03-19 DIAGNOSIS — I69393 Ataxia following cerebral infarction: Secondary | ICD-10-CM | POA: Diagnosis not present

## 2023-03-19 DIAGNOSIS — N1832 Chronic kidney disease, stage 3b: Secondary | ICD-10-CM | POA: Diagnosis not present

## 2023-03-19 DIAGNOSIS — J449 Chronic obstructive pulmonary disease, unspecified: Secondary | ICD-10-CM | POA: Diagnosis not present

## 2023-03-19 DIAGNOSIS — K5900 Constipation, unspecified: Secondary | ICD-10-CM | POA: Diagnosis not present

## 2023-03-19 DIAGNOSIS — I509 Heart failure, unspecified: Secondary | ICD-10-CM | POA: Diagnosis not present

## 2023-03-19 DIAGNOSIS — Z7401 Bed confinement status: Secondary | ICD-10-CM | POA: Diagnosis not present

## 2023-03-26 DIAGNOSIS — I69393 Ataxia following cerebral infarction: Secondary | ICD-10-CM | POA: Diagnosis not present

## 2023-03-26 DIAGNOSIS — N1832 Chronic kidney disease, stage 3b: Secondary | ICD-10-CM | POA: Diagnosis not present

## 2023-03-26 DIAGNOSIS — I509 Heart failure, unspecified: Secondary | ICD-10-CM | POA: Diagnosis not present

## 2023-03-26 DIAGNOSIS — K5900 Constipation, unspecified: Secondary | ICD-10-CM | POA: Diagnosis not present

## 2023-03-26 DIAGNOSIS — I130 Hypertensive heart and chronic kidney disease with heart failure and stage 1 through stage 4 chronic kidney disease, or unspecified chronic kidney disease: Secondary | ICD-10-CM | POA: Diagnosis not present

## 2023-03-26 DIAGNOSIS — J449 Chronic obstructive pulmonary disease, unspecified: Secondary | ICD-10-CM | POA: Diagnosis not present

## 2023-03-26 DIAGNOSIS — Z7401 Bed confinement status: Secondary | ICD-10-CM | POA: Diagnosis not present

## 2023-03-26 DIAGNOSIS — Z7901 Long term (current) use of anticoagulants: Secondary | ICD-10-CM | POA: Diagnosis not present

## 2023-03-26 DIAGNOSIS — G629 Polyneuropathy, unspecified: Secondary | ICD-10-CM | POA: Diagnosis not present

## 2023-04-07 DIAGNOSIS — I509 Heart failure, unspecified: Secondary | ICD-10-CM | POA: Diagnosis not present

## 2023-04-07 DIAGNOSIS — N1832 Chronic kidney disease, stage 3b: Secondary | ICD-10-CM | POA: Diagnosis not present

## 2023-04-07 DIAGNOSIS — I69393 Ataxia following cerebral infarction: Secondary | ICD-10-CM | POA: Diagnosis not present

## 2023-04-07 DIAGNOSIS — I130 Hypertensive heart and chronic kidney disease with heart failure and stage 1 through stage 4 chronic kidney disease, or unspecified chronic kidney disease: Secondary | ICD-10-CM | POA: Diagnosis not present

## 2023-04-07 DIAGNOSIS — Z7401 Bed confinement status: Secondary | ICD-10-CM | POA: Diagnosis not present

## 2023-04-07 DIAGNOSIS — G629 Polyneuropathy, unspecified: Secondary | ICD-10-CM | POA: Diagnosis not present

## 2023-04-07 DIAGNOSIS — Z7901 Long term (current) use of anticoagulants: Secondary | ICD-10-CM | POA: Diagnosis not present

## 2023-04-07 DIAGNOSIS — K5900 Constipation, unspecified: Secondary | ICD-10-CM | POA: Diagnosis not present

## 2023-04-07 DIAGNOSIS — J449 Chronic obstructive pulmonary disease, unspecified: Secondary | ICD-10-CM | POA: Diagnosis not present

## 2023-04-14 DIAGNOSIS — G629 Polyneuropathy, unspecified: Secondary | ICD-10-CM | POA: Diagnosis not present

## 2023-04-14 DIAGNOSIS — Z7901 Long term (current) use of anticoagulants: Secondary | ICD-10-CM | POA: Diagnosis not present

## 2023-04-14 DIAGNOSIS — I69393 Ataxia following cerebral infarction: Secondary | ICD-10-CM | POA: Diagnosis not present

## 2023-04-14 DIAGNOSIS — N1832 Chronic kidney disease, stage 3b: Secondary | ICD-10-CM | POA: Diagnosis not present

## 2023-04-14 DIAGNOSIS — I509 Heart failure, unspecified: Secondary | ICD-10-CM | POA: Diagnosis not present

## 2023-04-14 DIAGNOSIS — I130 Hypertensive heart and chronic kidney disease with heart failure and stage 1 through stage 4 chronic kidney disease, or unspecified chronic kidney disease: Secondary | ICD-10-CM | POA: Diagnosis not present

## 2023-04-14 DIAGNOSIS — K5900 Constipation, unspecified: Secondary | ICD-10-CM | POA: Diagnosis not present

## 2023-04-14 DIAGNOSIS — Z7401 Bed confinement status: Secondary | ICD-10-CM | POA: Diagnosis not present

## 2023-04-14 DIAGNOSIS — J449 Chronic obstructive pulmonary disease, unspecified: Secondary | ICD-10-CM | POA: Diagnosis not present

## 2023-04-23 DIAGNOSIS — K5900 Constipation, unspecified: Secondary | ICD-10-CM | POA: Diagnosis not present

## 2023-04-23 DIAGNOSIS — N1832 Chronic kidney disease, stage 3b: Secondary | ICD-10-CM | POA: Diagnosis not present

## 2023-04-23 DIAGNOSIS — J449 Chronic obstructive pulmonary disease, unspecified: Secondary | ICD-10-CM | POA: Diagnosis not present

## 2023-04-23 DIAGNOSIS — I509 Heart failure, unspecified: Secondary | ICD-10-CM | POA: Diagnosis not present

## 2023-04-23 DIAGNOSIS — Z7901 Long term (current) use of anticoagulants: Secondary | ICD-10-CM | POA: Diagnosis not present

## 2023-04-23 DIAGNOSIS — Z7401 Bed confinement status: Secondary | ICD-10-CM | POA: Diagnosis not present

## 2023-04-23 DIAGNOSIS — I130 Hypertensive heart and chronic kidney disease with heart failure and stage 1 through stage 4 chronic kidney disease, or unspecified chronic kidney disease: Secondary | ICD-10-CM | POA: Diagnosis not present

## 2023-04-23 DIAGNOSIS — I69393 Ataxia following cerebral infarction: Secondary | ICD-10-CM | POA: Diagnosis not present

## 2023-04-23 DIAGNOSIS — G629 Polyneuropathy, unspecified: Secondary | ICD-10-CM | POA: Diagnosis not present

## 2023-04-28 DIAGNOSIS — Z7901 Long term (current) use of anticoagulants: Secondary | ICD-10-CM | POA: Diagnosis not present

## 2023-04-28 DIAGNOSIS — N1832 Chronic kidney disease, stage 3b: Secondary | ICD-10-CM | POA: Diagnosis not present

## 2023-04-28 DIAGNOSIS — G629 Polyneuropathy, unspecified: Secondary | ICD-10-CM | POA: Diagnosis not present

## 2023-04-28 DIAGNOSIS — Z7401 Bed confinement status: Secondary | ICD-10-CM | POA: Diagnosis not present

## 2023-04-28 DIAGNOSIS — I509 Heart failure, unspecified: Secondary | ICD-10-CM | POA: Diagnosis not present

## 2023-04-28 DIAGNOSIS — I130 Hypertensive heart and chronic kidney disease with heart failure and stage 1 through stage 4 chronic kidney disease, or unspecified chronic kidney disease: Secondary | ICD-10-CM | POA: Diagnosis not present

## 2023-04-28 DIAGNOSIS — I69393 Ataxia following cerebral infarction: Secondary | ICD-10-CM | POA: Diagnosis not present

## 2023-04-28 DIAGNOSIS — J449 Chronic obstructive pulmonary disease, unspecified: Secondary | ICD-10-CM | POA: Diagnosis not present

## 2023-04-28 DIAGNOSIS — K5900 Constipation, unspecified: Secondary | ICD-10-CM | POA: Diagnosis not present

## 2023-05-06 DIAGNOSIS — I130 Hypertensive heart and chronic kidney disease with heart failure and stage 1 through stage 4 chronic kidney disease, or unspecified chronic kidney disease: Secondary | ICD-10-CM | POA: Diagnosis not present

## 2023-05-06 DIAGNOSIS — N1832 Chronic kidney disease, stage 3b: Secondary | ICD-10-CM | POA: Diagnosis not present

## 2023-05-06 DIAGNOSIS — I509 Heart failure, unspecified: Secondary | ICD-10-CM | POA: Diagnosis not present

## 2023-05-06 DIAGNOSIS — Z7401 Bed confinement status: Secondary | ICD-10-CM | POA: Diagnosis not present

## 2023-05-06 DIAGNOSIS — Z7901 Long term (current) use of anticoagulants: Secondary | ICD-10-CM | POA: Diagnosis not present

## 2023-05-06 DIAGNOSIS — I69393 Ataxia following cerebral infarction: Secondary | ICD-10-CM | POA: Diagnosis not present

## 2023-05-06 DIAGNOSIS — J449 Chronic obstructive pulmonary disease, unspecified: Secondary | ICD-10-CM | POA: Diagnosis not present

## 2023-05-06 DIAGNOSIS — K5900 Constipation, unspecified: Secondary | ICD-10-CM | POA: Diagnosis not present

## 2023-05-06 DIAGNOSIS — G629 Polyneuropathy, unspecified: Secondary | ICD-10-CM | POA: Diagnosis not present

## 2023-05-19 ENCOUNTER — Ambulatory Visit: Payer: Medicare HMO | Admitting: Pulmonary Disease

## 2023-05-26 DIAGNOSIS — Z993 Dependence on wheelchair: Secondary | ICD-10-CM | POA: Diagnosis not present

## 2023-05-26 DIAGNOSIS — J449 Chronic obstructive pulmonary disease, unspecified: Secondary | ICD-10-CM | POA: Diagnosis not present

## 2023-05-26 DIAGNOSIS — E7800 Pure hypercholesterolemia, unspecified: Secondary | ICD-10-CM | POA: Diagnosis not present

## 2023-05-26 DIAGNOSIS — I10 Essential (primary) hypertension: Secondary | ICD-10-CM | POA: Diagnosis not present

## 2023-05-26 DIAGNOSIS — N1832 Chronic kidney disease, stage 3b: Secondary | ICD-10-CM | POA: Diagnosis not present

## 2023-05-26 DIAGNOSIS — Z930 Tracheostomy status: Secondary | ICD-10-CM | POA: Diagnosis not present

## 2023-05-26 DIAGNOSIS — Z7401 Bed confinement status: Secondary | ICD-10-CM | POA: Diagnosis not present

## 2023-05-26 DIAGNOSIS — Z743 Need for continuous supervision: Secondary | ICD-10-CM | POA: Diagnosis not present

## 2023-05-26 DIAGNOSIS — D6869 Other thrombophilia: Secondary | ICD-10-CM | POA: Diagnosis not present

## 2023-05-26 DIAGNOSIS — R531 Weakness: Secondary | ICD-10-CM | POA: Diagnosis not present

## 2023-06-17 DIAGNOSIS — I69393 Ataxia following cerebral infarction: Secondary | ICD-10-CM | POA: Diagnosis not present

## 2023-06-17 DIAGNOSIS — I509 Heart failure, unspecified: Secondary | ICD-10-CM | POA: Diagnosis not present

## 2023-06-17 DIAGNOSIS — N1832 Chronic kidney disease, stage 3b: Secondary | ICD-10-CM | POA: Diagnosis not present

## 2023-06-17 DIAGNOSIS — L89152 Pressure ulcer of sacral region, stage 2: Secondary | ICD-10-CM | POA: Diagnosis not present

## 2023-06-17 DIAGNOSIS — G629 Polyneuropathy, unspecified: Secondary | ICD-10-CM | POA: Diagnosis not present

## 2023-06-17 DIAGNOSIS — Z9181 History of falling: Secondary | ICD-10-CM | POA: Diagnosis not present

## 2023-07-23 ENCOUNTER — Telehealth: Payer: Self-pay

## 2023-07-23 NOTE — Telephone Encounter (Signed)
 Copied from CRM [id number]. Topic: Clinical - Order For Equipment >> Jul 23, 2023  8:54 AM Isabell A wrote: Reason for CRM: Patients daughter Mar Semen states she tried to order trach supplies but [employer] is requiring a new order.

## 2023-07-24 NOTE — Telephone Encounter (Signed)
 Ok to order trach supplies. She needs to have follow up visit scheduled.  Thanks, JD

## 2023-07-28 ENCOUNTER — Other Ambulatory Visit: Payer: Self-pay

## 2023-07-28 DIAGNOSIS — Z430 Encounter for attention to tracheostomy: Secondary | ICD-10-CM

## 2023-08-06 NOTE — Telephone Encounter (Signed)
 Katelyn [name]

## 2023-08-19 ENCOUNTER — Encounter: Payer: Self-pay | Admitting: Pulmonary Disease

## 2023-08-19 ENCOUNTER — Ambulatory Visit (INDEPENDENT_AMBULATORY_CARE_PROVIDER_SITE_OTHER): Admitting: Pulmonary Disease

## 2023-08-19 VITALS — BP 155/82 | HR 69

## 2023-08-19 DIAGNOSIS — Z87891 Personal history of nicotine dependence: Secondary | ICD-10-CM | POA: Diagnosis not present

## 2023-08-19 DIAGNOSIS — R1310 Dysphagia, unspecified: Secondary | ICD-10-CM | POA: Diagnosis not present

## 2023-08-19 DIAGNOSIS — R404 Transient alteration of awareness: Secondary | ICD-10-CM | POA: Diagnosis not present

## 2023-08-19 DIAGNOSIS — Z930 Tracheostomy status: Secondary | ICD-10-CM

## 2023-08-19 DIAGNOSIS — R059 Cough, unspecified: Secondary | ICD-10-CM | POA: Diagnosis not present

## 2023-08-19 DIAGNOSIS — J9611 Chronic respiratory failure with hypoxia: Secondary | ICD-10-CM | POA: Diagnosis not present

## 2023-08-19 DIAGNOSIS — J449 Chronic obstructive pulmonary disease, unspecified: Secondary | ICD-10-CM

## 2023-08-19 DIAGNOSIS — Z743 Need for continuous supervision: Secondary | ICD-10-CM | POA: Diagnosis not present

## 2023-08-19 DIAGNOSIS — Z430 Encounter for attention to tracheostomy: Secondary | ICD-10-CM

## 2023-08-19 DIAGNOSIS — R262 Difficulty in walking, not elsewhere classified: Secondary | ICD-10-CM | POA: Diagnosis not present

## 2023-08-19 NOTE — Patient Instructions (Addendum)
 We will place a new order for trach supplies  Shiley [id number] 7.2mm ID 10.81mm OD 74mm length  We will order you a nebulizer machine and supplies  Use duoneb nebulizer treatments 2-3 times per day - this will help with mucous clearance  Recommend activating your MyChart account to be able to do video visits in the future  We will schedule you for follow up visit with Maude Banner in 6 months  Follow up in 3 months via video visits

## 2023-08-19 NOTE — Progress Notes (Signed)
 Synopsis: Hospital follow up  Subjective:   PATIENT ID: Katelyn [name] GENDER: female DOB: 1950/07/28, MRN: [id number]  HPI  Chief Complaint  Patient presents with   Follow-up   Katelyn [name] is a 73 year old woman, former smoker with history of laryngeal cancer (diagnosed in 2015 but patient did not seek treatment til 2019) status post laryngectomy and tracheostomy, hypertension, hyperlipidemia, right leg DVT, and stroke who returns to pulmonary clinic for follow up.   OV 02/08/20 She has been doing well since last visit 12/15/19. She has not had any hospital admissions since she was discharged on 11/26/19. She denies issues with expectorating mucous or secretions via her trach. She is taking robitussin DM on a daily basis. She has not picked up her albuterol  nebulizer treatments yet for bronchial hygiene. She has weaned off trach collar supplemental oxygen .  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 Informed her that this doesn't seem likely as there are no signs of pleural plaques on her chest imaging or signs of insterstitial lung disease. Informed her that she has emphysematous changes on her CT scan which are related to her history of smoking.   OV 08/19/23 Accompanied by her family. Brough in via EMS. She has increased dyspnea, wheezing, cough with mucous production. She has not been using her nebulizers regularly.   Tracheostomy supplies are running low and need reordering. The current trach size is 7.5.      Past Medical History:  Diagnosis Date   Acute gallstone pancreatitis 01/08/2014   AKI (acute kidney injury) (HCC) 01/08/2014   Anemia    Blood transfusion    11'15 last admission   Cancer [facility])    CHF (congestive heart failure) (HCC)    Cholecystitis    COPD (chronic obstructive pulmonary disease) (HCC)    H/O tracheostomy    Hip fracture (HCC)    Hypercholesteremia     Hypertension    Right leg DVT (HCC) 05/08/2014   Septic shock - Kelbsiella 01/09/2014   Shortness of breath    Stroke [facility])    '13- right side weakness, ambulates with cane     Family History  Problem Relation Age of Onset   Breast cancer Maternal Aunt    Diabetes Mellitus I Father    Diabetes Mellitus I Sister    Anesthesia problems Neg Hx    Hypotension Neg Hx    Malignant hyperthermia Neg Hx    Pseudochol deficiency Neg Hx    Colon cancer Neg Hx    Esophageal cancer Neg Hx    Pancreatic cancer Neg Hx    Kidney disease Neg Hx    Liver disease Neg Hx      Social History   Socioeconomic History   Marital status: Widowed    Spouse name: Not on file   Number of children: Not on file   Years of education: Not on file   Highest education level: Not on file  Occupational History   Occupation: Retired  Tobacco Use   Smoking status: Former    Current packs/day: 0.00    Average packs/day: 3.0 packs/day for 48.3 years (144.8 ttl pk-yrs)    Types: Cigarettes    Start date: 20    Quit date: 05/29/2017    Years since quitting: 6.2   Smokeless tobacco: Never   Tobacco comments:    1 carton lasted 3 days  Substance and Sexual Activity  Alcohol use: No    Alcohol/week: 0.0 standard drinks of alcohol   Drug use: No   Sexual activity: Not on file  Other Topics Concern   Not on file  Social History Narrative   Not on file   Social Drivers of Health   Financial Resource Strain: Not on file  Food Insecurity: Not on file  Transportation Needs: No Transportation Needs (11/22/2019)   PRAPARE - Transportation    Lack of Transportation (Medical): No    Lack of Transportation (Non-Medical): No  Physical Activity: Not on file  Stress: Not on file  Social Connections: Not on file  Intimate Partner Violence: Not on file     No Known Allergies   Outpatient Medications Prior to Visit  Medication Sig Dispense Refill   ALPRAZolam  (XANAX ) 0.5 MG tablet Take 1 tablet (0.5 mg  total) by mouth 3 (three) times daily as needed for anxiety. 20 tablet 0   apixaban  (ELIQUIS ) 5 MG TABS tablet Take 1 tablet (5 mg total) by mouth 2 (two) times daily. 60 tablet 0   atorvastatin  (LIPITOR ) 80 MG tablet Take 1 tablet (80 mg total) by mouth daily. 30 tablet 0   ezetimibe (ZETIA) 10 MG tablet Take 10 mg by mouth daily.     guaiFENesin -dextromethorphan  (ROBITUSSIN DM) 100-10 MG/5ML syrup Take 10 mLs by mouth every 6 (six) hours. 118 mL 0   hydrochlorothiazide (HYDRODIURIL) 12.5 MG tablet Take 12.5 mg by mouth daily.     metoprolol  tartrate (LOPRESSOR ) 25 MG tablet Take 1 tablet (25 mg total) by mouth 2 (two) times daily. 60 tablet 0   nystatin  cream (MYCOSTATIN ) Apply 1 application topically 2 (two) times daily. 30 g 0   No facility-administered medications prior to visit.    Review of Systems  Constitutional:  Negative for chills, fever, malaise/fatigue and weight loss.  HENT:  Negative for congestion, nosebleeds, sinus pain and sore throat.   Eyes:  Negative for blurred vision.  Respiratory:  Positive for cough, sputum production, shortness of breath and wheezing. Negative for hemoptysis.   Cardiovascular:  Negative for chest pain, palpitations, orthopnea and leg swelling.  Gastrointestinal:  Negative for abdominal pain, blood in stool, diarrhea, heartburn, nausea and vomiting.  Genitourinary:  Negative for dysuria and hematuria.  Musculoskeletal:  Negative for joint pain and myalgias.  Skin:  Negative for itching and rash.  Neurological:  Negative for dizziness, weakness and headaches.  Endo/Heme/Allergies:  Does not bruise/bleed easily.  Psychiatric/Behavioral: Negative.      Objective:   Vitals:   08/19/23 1054  BP: (!) 155/82  Pulse: 69  SpO2: 96%     Physical Exam Constitutional:      Appearance: She is normal weight.  HENT:     Head: Normocephalic and atraumatic.     Nose: Nose normal.     Mouth/Throat:     Mouth: Mucous membranes are moist.      Pharynx: Oropharynx is clear.  Eyes:     General: No scleral icterus.    Conjunctiva/sclera: Conjunctivae normal.  Neck:     Trachea: Tracheostomy present.     Comments: Trach in place. Clean, dry, no bleeding or secretions Cardiovascular:     Rate and Rhythm: Normal rate and regular rhythm.     Pulses: Normal pulses.     Heart sounds: Normal heart sounds. No murmur heard. Pulmonary:     Effort: Pulmonary effort is normal.     Breath sounds: Normal breath sounds. No wheezing, rhonchi or rales.  Musculoskeletal:     Right lower leg: No edema.     Left lower leg: No edema.  Neurological:     Mental Status: She is alert.  Psychiatric:        Judgment: Judgment normal.     CBC    Component Value Date/Time   WBC 7.5 04/21/2021 0324   RBC 4.27 04/21/2021 0324   HGB 12.5 04/21/2021 0324   HCT 39.4 04/21/2021 0324   PLT 167 04/21/2021 0324   MCV 92.3 04/21/2021 0324   MCH 29.3 04/21/2021 0324   MCHC 31.7 04/21/2021 0324   RDW 12.0 04/21/2021 0324   LYMPHSABS 1.9 04/21/2021 0324   MONOABS 0.4 04/21/2021 0324   EOSABS 0.0 04/21/2021 0324   BASOSABS 0.0 04/21/2021 0324      Latest Ref Rng & Units 04/21/2021    3:24 AM 02/27/2020    5:38 PM 11/26/2019    5:03 AM  BMP  Glucose 70 - 99 mg/dL 878  896  96   BUN 8 - 23 mg/dL 14  23  14    Creatinine 0.44 - 1.00 mg/dL 9.05  9.17  9.04   Sodium 135 - 145 mmol/L 142  140  143   Potassium 3.5 - 5.1 mmol/L 4.3  3.7  4.1   Chloride 98 - 111 mmol/L 108  103  105   CO2 22 - 32 mmol/L 26  28  29    Calcium  8.9 - 10.3 mg/dL 9.6  9.4  9.4     Chest imaging: CT Chest 11/07/22 1. No acute findings in the chest. Specifically, no findings to explain the patient's history of chronic cough. 2. 4 mm medial perifissural nodule in the right lung, new in the interval. No follow-up needed if patient is low-risk.This recommendation follows the consensus statement: Guidelines for Management of Incidental Pulmonary Nodules Detected on CT  Images: From the Fleischner Society 2017; Radiology 2017; 284:228-243. 3. Aortic Atherosclerosis (ICD10-I70.0) and Emphysema (ICD10-J43.9).  11/24/19 CXR Tracheostomy tube is in place. Heart size and pulmonary vascularity are normal. Prominence of the right inferior hilum likely represents mucous plugging as seen on prior CT of 04/12/2018. However this change could also represent mass or adenopathy. Slight fibrosis in the lung bases. No consolidation or airspace disease. No pleural effusions. No pneumothorax. Calcified and tortuous aorta.  CTA Chest 04/12/2018 1.  No evidence of pulmonary embolism. 2. Severe bilateral lower lobe peribronchial thickening with scattered mucous impaction, likely acute on chronic airways inflammation. 3. Large epiglottic mass again noted, consistent with history of squamous cell carcinoma. No evidence of thoracic metastatic disease. 4.  Emphysema (ICD10-J43.9). 5.  Aortic atherosclerosis (ICD10-I70.0).  PFT: None on file  Labs: Reviewed, as above  Echo: 11/11/19 1. Left ventricular ejection fraction, by estimation, is 65 to 70%. The  left ventricle has normal function. The left ventricle has no regional  wall motion abnormalities. Left ventricular diastolic parameters are  consistent with Grade I diastolic  dysfunction (impaired relaxation).   2. Right ventricular systolic function is normal. The right ventricular  size is normal. There is normal pulmonary artery systolic pressure. The  estimated right ventricular systolic pressure is 24.3 mmHg.   3. The mitral valve is grossly normal. Trivial mitral valve  regurgitation. No evidence of mitral stenosis.   4. The aortic valve is tricuspid. There is moderate calcification of the  aortic valve. Aortic valve regurgitation is mild. Mild to moderate aortic  valve sclerosis/calcification is present, without any evidence of aortic  stenosis.  5. The inferior vena cava is normal in size with greater than  50%  respiratory variability, suggesting right atrial pressure of 3 mmHg. Heart Catheterization:     Assessment & Plan:   Tracheostomy care [facility])  Chronic obstructive pulmonary disease, unspecified COPD type (HCC)  Chronic hypoxemic respiratory failure (HCC)  Discussion: Katelyn [name] is a 73 year old woman, former smoker with history of laryngeal cancer (diagnosed in 2015 but patient did not seek treatment til 2019) status post laryngectomy and tracheostomy, COPD/Emphysema, hypertension, hyperlipidemia, right leg DVT, and stroke who comes to clinic for return visit.   Chronic Obstructive Pulmonary Disease (COPD) - Order nebulizer machine and duonebs for use up to four times daily. - Educated on regular nebulizer use to prevent exacerbations. - Discussed potential addition of other medications if current treatment is insufficient.  Chronic Hypoxemic Respiratory Failure - Reinforced importance of consistent oxygen  use   Tracheostomy Care - order trach supplies with refill - recommend follow up with Maude Banner, NP in the tracheostomy clinic. Will arranage for follow up in 4-6 months there.  Follow up in 3 months via video visit  Dorn Chill, MD [facility] Office: [phone]    Current Outpatient Medications:    ALPRAZolam  (XANAX ) 0.5 MG tablet, Take 1 tablet (0.5 mg total) by mouth 3 (three) times daily as needed for anxiety., Disp: 20 tablet, Rfl: 0   apixaban  (ELIQUIS ) 5 MG TABS tablet, Take 1 tablet (5 mg total) by mouth 2 (two) times daily., Disp: 60 tablet, Rfl: 0   atorvastatin  (LIPITOR ) 80 MG tablet, Take 1 tablet (80 mg total) by mouth daily., Disp: 30 tablet, Rfl: 0   ezetimibe (ZETIA) 10 MG tablet, Take 10 mg by mouth daily., Disp: , Rfl:    guaiFENesin -dextromethorphan  (ROBITUSSIN DM) 100-10 MG/5ML syrup, Take 10 mLs by mouth every 6 (six) hours., Disp: 118 mL, Rfl: 0   hydrochlorothiazide (HYDRODIURIL) 12.5 MG tablet, Take 12.5  mg by mouth daily., Disp: , Rfl:    metoprolol  tartrate (LOPRESSOR ) 25 MG tablet, Take 1 tablet (25 mg total) by mouth 2 (two) times daily., Disp: 60 tablet, Rfl: 0   nystatin  cream (MYCOSTATIN ), Apply 1 application topically 2 (two) times daily., Disp: 30 g, Rfl: 0

## 2023-08-26 ENCOUNTER — Encounter: Payer: Self-pay | Admitting: Pulmonary Disease

## 2023-08-26 MED ORDER — IPRATROPIUM-ALBUTEROL 0.5-2.5 (3) MG/3ML IN SOLN: 3.0000 mL | Freq: Four times a day (QID) | RESPIRATORY_TRACT | 11 refills | Status: AC | PRN

## 2023-09-01 ENCOUNTER — Telehealth: Payer: Self-pay

## 2023-09-01 ENCOUNTER — Other Ambulatory Visit: Payer: Self-pay

## 2023-09-01 DIAGNOSIS — J449 Chronic obstructive pulmonary disease, unspecified: Secondary | ICD-10-CM

## 2023-09-01 DIAGNOSIS — R053 Chronic cough: Secondary | ICD-10-CM

## 2023-09-01 NOTE — Telephone Encounter (Signed)
-----   Message from Dorn KATHEE Chill sent at 08/26/2023  5:07 PM EDT ----- Regarding: Orders from last office visit Please order Trach supplies:  Shiley [id number] 7.12mm ID 10.50mm OD 74mm length  Please order her a nebulizer machine and supplies  Can you double check she has been scheduled with Maude Banner in his trach clinic.  Thanks, Thom

## 2023-09-02 NOTE — Telephone Encounter (Signed)
 Patient is getting called by the clinic for the [facility] clinic. Nothing further needed.

## 2023-09-03 ENCOUNTER — Telehealth: Payer: Self-pay

## 2023-09-03 NOTE — Telephone Encounter (Signed)
 Hi [name], would you be able to assist with Katelyn [name] getting an appointment in your trach clinic for follow up.   Regarding tylenol , that is an over the counter medication.  JD

## 2023-09-03 NOTE — Telephone Encounter (Signed)
 Spoke with patient daughter waiting on a call back from trach clinic looking for AM ppt. Due to transportation.

## 2023-09-21 ENCOUNTER — Ambulatory Visit (HOSPITAL_COMMUNITY)
Admission: RE | Admit: 2023-09-21 | Discharge: 2023-09-21 | Disposition: A | Discharge status: Home / Self Care | Source: Ambulatory Visit | Attending: Acute Care | Admitting: Acute Care

## 2023-09-21 DIAGNOSIS — Z930 Tracheostomy status: Secondary | ICD-10-CM

## 2023-09-21 DIAGNOSIS — Z7401 Bed confinement status: Secondary | ICD-10-CM | POA: Diagnosis not present

## 2023-09-21 DIAGNOSIS — Z87891 Personal history of nicotine dependence: Secondary | ICD-10-CM | POA: Diagnosis not present

## 2023-09-21 DIAGNOSIS — Z743 Need for continuous supervision: Secondary | ICD-10-CM | POA: Diagnosis not present

## 2023-09-21 DIAGNOSIS — Z8521 Personal history of malignant neoplasm of larynx: Secondary | ICD-10-CM | POA: Diagnosis not present

## 2023-09-21 DIAGNOSIS — Z9002 Acquired absence of larynx: Secondary | ICD-10-CM | POA: Insufficient documentation

## 2023-09-21 DIAGNOSIS — R6889 Other general symptoms and signs: Secondary | ICD-10-CM | POA: Diagnosis not present

## 2023-09-21 DIAGNOSIS — J449 Chronic obstructive pulmonary disease, unspecified: Secondary | ICD-10-CM | POA: Diagnosis not present

## 2023-09-21 DIAGNOSIS — Z430 Encounter for attention to tracheostomy: Secondary | ICD-10-CM | POA: Insufficient documentation

## 2023-09-21 DIAGNOSIS — C139 Malignant neoplasm of hypopharynx, unspecified: Secondary | ICD-10-CM | POA: Diagnosis present

## 2023-09-21 DIAGNOSIS — Z91199 Patient's noncompliance with other medical treatment and regimen due to unspecified reason: Secondary | ICD-10-CM | POA: Diagnosis not present

## 2023-09-21 NOTE — Consult Note (Signed)
 NAME:  Marcena Dias, MRN:  [id number], DOB:  1951-02-03, LOS: 0 ADMISSION DATE:  09/21/2023, CONSULTATION DATE:  7/28 REFERRING MD:  Kara, CHIEF COMPLAINT:  trach management    History of Present Illness:  This is a 73 year old female w/ h/o laryngeal cancer and laryngectomy. Has had trach for airway management since recurrent resp failure episodes back in 2021. She has not used electrolarynx for some time. Has been at home w/ trach, seldom requires sxn, has had current trach in place since 2021 and has only done inner cannula changes.  She has been referred by the Pulmonary clinic for on-going airway management   Pertinent  Medical History  Laryngeal cancer s/p laryngectomy 2019, COPD, has had trach since 2021 after requiring mechanical ventilation for resp failure, HTN, HLD, right leg DVT  Interim History / Subjective:  No distress   Objective   Sats 90s   Examination: General: 73 year old female sitting up in medical stretcher  HENT: NCAT no JVD laryngectomy stoma unremarkable. Has 6 cuffless shiley.  Lungs: clear occ wheeze no accessory use  Cardiovascular: rrr  Abdomen: soft  Extremities: warm brisk CR Neuro: awake and oriented    Assessment and Plan    Principal Problem:   H/O laryngectomy Active Problems:   Cancer of hypopharynx (HCC)   History of noncompliance with medical treatment   Chronic obstructive pulmonary disease (HCC)  H/O laryngectomy Overview: Chronic laryngectomy; surgically completed 2019. Has had trach for airway maintenance since 2021. Does not use humidification. Rarely needs sxn. Has been successful at home w/ this approach for several years now. Her stoma is matured. Really does not need to have trach as far as from a laryngectomy stand-point. Looks like placed during admission back in 2021 when admitted for PNA and mucous plugging. Options a) dc trach, just keep stoma open b) laryngostomy tube c) keep trach.  She has done well w/ this I am  reluctant to change things especially as she is not really compliant w. Humidification so plugging in future remains concern.  Plan Cont current trach (6 cuffless shiley)-->changed today 7/28 Will see if we can order electrolarynx  ROV 3 mo.         Review of Systems:   Review of Systems  Constitutional:  Negative for chills, fever and malaise/fatigue.  HENT:  Negative for congestion and nosebleeds.   Eyes: Negative.   Respiratory:  Negative for cough, sputum production, shortness of breath and stridor.   Cardiovascular:  Negative for chest pain and leg swelling.  Gastrointestinal: Negative.   Genitourinary: Negative.   Musculoskeletal: Negative.   Skin: Negative.   Neurological: Negative.   Endo/Heme/Allergies: Negative.      Past Medical History:  She,  has a past medical history of Acute gallstone pancreatitis (01/08/2014), AKI (acute kidney injury) (HCC) (01/08/2014), Anemia, Blood transfusion, Cancer (HCC), CHF (congestive heart failure) (HCC), Cholecystitis, COPD (chronic obstructive pulmonary disease) (HCC), H/O tracheostomy, Hip fracture (HCC), Hypercholesteremia, Hypertension, Right leg DVT (HCC) (05/08/2014), Septic shock - Kelbsiella (01/09/2014), Shortness of breath, and Stroke (HCC).   Surgical History:   Past Surgical History:  Procedure Laterality Date   ABDOMINAL HYSTERECTOMY     DIRECT LARYNGOSCOPY N/A 06/08/2017   Procedure: DIRECT LARYNGOSCOPY WITH BIOPSY;  Surgeon: Terri Alan PARAS, MD;  Location: [facility] OR;  Service: ENT;  Laterality: N/A;   ENDOSCOPIC RETROGRADE CHOLANGIOPANCREATOGRAPHY (ERCP) WITH PROPOFOL  N/A 01/11/2014   Procedure: ENDOSCOPIC RETROGRADE CHOLANGIOPANCREATOGRAPHY (ERCP) WITH PROPOFOL ;  Surgeon: Lupita FORBES Commander, MD;  Location: [facility] ENDOSCOPY;  Service: Endoscopy;  Laterality: N/A;   ERCP     ERCP N/A 05/26/2014   Procedure: ENDOSCOPIC RETROGRADE CHOLANGIOPANCREATOGRAPHY (ERCP);  Surgeon: Lupita FORBES Commander, MD;  Location: [facility];  Service:  Endoscopy;  Laterality: N/A;   ESOPHAGOGASTRODUODENOSCOPY (EGD) WITH PROPOFOL  N/A 12/07/2014   Procedure: ESOPHAGOGASTRODUODENOSCOPY (EGD) WITH PROPOFOL ;  Surgeon: Toribio SHAUNNA Cedar, MD;  Location: [facility] ENDOSCOPY;  Service: Endoscopy;  Laterality: N/A;   EUS N/A 12/07/2014   Procedure: UPPER ENDOSCOPIC ULTRASOUND (EUS) LINEAR;  Surgeon: Toribio SHAUNNA Cedar, MD;  Location: [facility] ENDOSCOPY;  Service: Endoscopy;  Laterality: N/A;   GASTROINTESTINAL STENT REMOVAL N/A 12/07/2014   Procedure: GASTROINTESTINAL STENT REMOVAL;  Surgeon: Toribio SHAUNNA Cedar, MD;  Location: [facility] ENDOSCOPY;  Service: Endoscopy;  Laterality: N/A;  pancreatic stent removal   HIP FRACTURE SURGERY     INTRAMEDULLARY (IM) NAIL INTERTROCHANTERIC Right 05/29/2017   Procedure: INTRAMEDULLARY (IM) NAIL INTERTROCHANTRIC FEMUR;  Surgeon: Sharl Selinda Dover, MD;  Location: [facility] OR;  Service: Orthopedics;  Laterality: Right;   PERCUTANEOUS TRACHEOSTOMY N/A 05/29/2017   Procedure: PERCUTANEOUS TRACHEOSTOMY;  Surgeon: Kimble Agent, MD;  Location: [facility] OR;  Service: General;  Laterality: N/A;   TEE WITHOUT CARDIOVERSION  03/06/2011   Procedure: TRANSESOPHAGEAL ECHOCARDIOGRAM (TEE);  Surgeon: Toribio JONELLE Fuel, MD;  Location: [facility] ENDOSCOPY;  Service: Cardiovascular;  Laterality: N/A;     Social History:   reports that she quit smoking about 6 years ago. Her smoking use included cigarettes. She started smoking about 54 years ago. She has a 144.8 pack-year smoking history. She has never used smokeless tobacco. She reports that she does not drink alcohol and does not use drugs.   Family History:  Her family history includes Breast cancer in her maternal aunt; Diabetes Mellitus I in her father and sister. There is no history of Anesthesia problems, Hypotension, Malignant hyperthermia, Pseudochol deficiency, Colon cancer, Esophageal cancer, Pancreatic cancer, Kidney disease, or Liver disease.   Allergies No Known Allergies   Home Medications  Prior to Admission  medications   Medication Sig Start Date End Date Taking? Authorizing Provider  ALPRAZolam  (XANAX ) 0.5 MG tablet Take 1 tablet (0.5 mg total) by mouth 3 (three) times daily as needed for anxiety. 11/16/19   Lue Elsie BROCKS, MD  apixaban  (ELIQUIS ) 5 MG TABS tablet Take 1 tablet (5 mg total) by mouth 2 (two) times daily. 11/04/19   Love, Sharlet RAMAN, PA-C  atorvastatin  (LIPITOR ) 80 MG tablet Take 1 tablet (80 mg total) by mouth daily. 10/18/19   Swayze, Ava, DO  ezetimibe (ZETIA) 10 MG tablet Take 10 mg by mouth daily. 10/16/22   [provider]  guaiFENesin -dextromethorphan  (ROBITUSSIN DM) 100-10 MG/5ML syrup Take 10 mLs by mouth every 6 (six) hours. 10/18/19   Swayze, Ava, DO  hydrochlorothiazide (HYDRODIURIL) 12.5 MG tablet Take 12.5 mg by mouth daily. 05/30/22   [provider]  ipratropium-albuterol  (DUONEB) 0.5-2.5 (3) MG/3ML SOLN Take 3 mLs by nebulization every 6 (six) hours as needed. 08/26/23   Kara Dorn NOVAK, MD  metoprolol  tartrate (LOPRESSOR ) 25 MG tablet Take 1 tablet (25 mg total) by mouth 2 (two) times daily. 11/04/19   Love, Sharlet RAMAN, PA-C  nystatin  cream (MYCOSTATIN ) Apply 1 application topically 2 (two) times daily. 11/04/19   Maurice Sharlet RAMAN, PA-C     My time 34 min

## 2023-09-21 NOTE — Progress Notes (Addendum)
      To whom it may concern,    Please excuse Katelyn [name]. Her assistance was required for her mother's clinic appointment here at [facility].   Thank you for your understanding and cooperation     Maude FORBES Banner ACNP-BC [facility] Pulmonary/Critical Care  09/21/23

## 2023-09-21 NOTE — Progress Notes (Signed)
 Tracheostomy Procedure Note  Maeva Dant [id number] 11/28/1950  Pre Procedure Tracheostomy Information  Trach Brand: Shiley Size: 6.0 3LW24M Style: Uncuffed Secured by: Velcro   Procedure: Trach Cleaning and Trach Change    Post Procedure Tracheostomy Information  Trach Brand: Shiley Size: 6.0 3LW24M Style: Uncuffed Secured by: Velcro   Post Procedure Evaluation:  ETCO2 positive color change from yellow to purple : Yes.   Vital signs:VSS Patients current condition: stable Complications: No apparent complications Trach site exam: clean, dry Wound care done: 4 x 4 gauze drain Patient did tolerate procedure well.   Education: Trach change schedule  Prescription needs: none    Additional needs: An additional [phone] trach given to patient at this visit from home usage.

## 2023-12-01 DIAGNOSIS — N1832 Chronic kidney disease, stage 3b: Secondary | ICD-10-CM | POA: Diagnosis not present

## 2023-12-01 DIAGNOSIS — D6869 Other thrombophilia: Secondary | ICD-10-CM | POA: Diagnosis not present

## 2023-12-01 DIAGNOSIS — G4700 Insomnia, unspecified: Secondary | ICD-10-CM | POA: Diagnosis not present

## 2023-12-01 DIAGNOSIS — Z0000 Encounter for general adult medical examination without abnormal findings: Secondary | ICD-10-CM | POA: Diagnosis not present

## 2023-12-01 DIAGNOSIS — H6123 Impacted cerumen, bilateral: Secondary | ICD-10-CM | POA: Diagnosis not present

## 2023-12-01 DIAGNOSIS — Z743 Need for continuous supervision: Secondary | ICD-10-CM | POA: Diagnosis not present

## 2023-12-01 DIAGNOSIS — Z7401 Bed confinement status: Secondary | ICD-10-CM | POA: Diagnosis not present

## 2023-12-01 DIAGNOSIS — H9193 Unspecified hearing loss, bilateral: Secondary | ICD-10-CM | POA: Diagnosis not present

## 2023-12-01 DIAGNOSIS — E7800 Pure hypercholesterolemia, unspecified: Secondary | ICD-10-CM | POA: Diagnosis not present

## 2023-12-01 DIAGNOSIS — Z1331 Encounter for screening for depression: Secondary | ICD-10-CM | POA: Diagnosis not present

## 2023-12-07 DIAGNOSIS — Z86718 Personal history of other venous thrombosis and embolism: Secondary | ICD-10-CM | POA: Diagnosis not present

## 2023-12-07 DIAGNOSIS — I69351 Hemiplegia and hemiparesis following cerebral infarction affecting right dominant side: Secondary | ICD-10-CM | POA: Diagnosis not present

## 2023-12-07 DIAGNOSIS — Z8673 Personal history of transient ischemic attack (TIA), and cerebral infarction without residual deficits: Secondary | ICD-10-CM | POA: Diagnosis not present

## 2023-12-07 DIAGNOSIS — I129 Hypertensive chronic kidney disease with stage 1 through stage 4 chronic kidney disease, or unspecified chronic kidney disease: Secondary | ICD-10-CM | POA: Diagnosis not present

## 2023-12-07 DIAGNOSIS — N1832 Chronic kidney disease, stage 3b: Secondary | ICD-10-CM | POA: Diagnosis not present

## 2023-12-07 DIAGNOSIS — D6869 Other thrombophilia: Secondary | ICD-10-CM | POA: Diagnosis not present

## 2023-12-07 DIAGNOSIS — E785 Hyperlipidemia, unspecified: Secondary | ICD-10-CM | POA: Diagnosis not present

## 2023-12-15 DIAGNOSIS — D6869 Other thrombophilia: Secondary | ICD-10-CM | POA: Diagnosis not present

## 2023-12-15 DIAGNOSIS — I129 Hypertensive chronic kidney disease with stage 1 through stage 4 chronic kidney disease, or unspecified chronic kidney disease: Secondary | ICD-10-CM | POA: Diagnosis not present

## 2023-12-15 DIAGNOSIS — Z86718 Personal history of other venous thrombosis and embolism: Secondary | ICD-10-CM | POA: Diagnosis not present

## 2023-12-15 DIAGNOSIS — Z8673 Personal history of transient ischemic attack (TIA), and cerebral infarction without residual deficits: Secondary | ICD-10-CM | POA: Diagnosis not present

## 2023-12-15 DIAGNOSIS — E785 Hyperlipidemia, unspecified: Secondary | ICD-10-CM | POA: Diagnosis not present

## 2023-12-15 DIAGNOSIS — N1832 Chronic kidney disease, stage 3b: Secondary | ICD-10-CM | POA: Diagnosis not present

## 2023-12-15 DIAGNOSIS — I69351 Hemiplegia and hemiparesis following cerebral infarction affecting right dominant side: Secondary | ICD-10-CM | POA: Diagnosis not present

## 2024-01-01 DIAGNOSIS — E785 Hyperlipidemia, unspecified: Secondary | ICD-10-CM | POA: Diagnosis not present

## 2024-01-01 DIAGNOSIS — N1832 Chronic kidney disease, stage 3b: Secondary | ICD-10-CM | POA: Diagnosis not present

## 2024-01-01 DIAGNOSIS — I69351 Hemiplegia and hemiparesis following cerebral infarction affecting right dominant side: Secondary | ICD-10-CM | POA: Diagnosis not present

## 2024-01-01 DIAGNOSIS — D6869 Other thrombophilia: Secondary | ICD-10-CM | POA: Diagnosis not present

## 2024-01-01 DIAGNOSIS — I129 Hypertensive chronic kidney disease with stage 1 through stage 4 chronic kidney disease, or unspecified chronic kidney disease: Secondary | ICD-10-CM | POA: Diagnosis not present

## 2024-01-01 DIAGNOSIS — Z8673 Personal history of transient ischemic attack (TIA), and cerebral infarction without residual deficits: Secondary | ICD-10-CM | POA: Diagnosis not present

## 2024-01-01 DIAGNOSIS — Z86718 Personal history of other venous thrombosis and embolism: Secondary | ICD-10-CM | POA: Diagnosis not present

## 2024-01-04 DIAGNOSIS — I129 Hypertensive chronic kidney disease with stage 1 through stage 4 chronic kidney disease, or unspecified chronic kidney disease: Secondary | ICD-10-CM | POA: Diagnosis not present

## 2024-01-12 DIAGNOSIS — E785 Hyperlipidemia, unspecified: Secondary | ICD-10-CM | POA: Diagnosis not present

## 2024-03-09 ENCOUNTER — Other Ambulatory Visit: Payer: Self-pay | Admitting: Acute Care

## 2024-03-09 ENCOUNTER — Ambulatory Visit (HOSPITAL_COMMUNITY)
Admission: RE | Admit: 2024-03-09 | Discharge: 2024-03-09 | Disposition: A | Discharge status: Home / Self Care | Source: Ambulatory Visit | Attending: Family Medicine | Admitting: Family Medicine

## 2024-03-09 ENCOUNTER — Telehealth: Payer: Self-pay | Admitting: Acute Care

## 2024-03-09 DIAGNOSIS — Z930 Tracheostomy status: Secondary | ICD-10-CM

## 2024-03-09 DIAGNOSIS — Z9002 Acquired absence of larynx: Secondary | ICD-10-CM | POA: Insufficient documentation

## 2024-03-09 DIAGNOSIS — Z430 Encounter for attention to tracheostomy: Secondary | ICD-10-CM | POA: Diagnosis present

## 2024-03-09 NOTE — Progress Notes (Unsigned)
 Tracheostomy Home Health Order Sheet  Katelyn [name] Tracheostomy Clinic: [facility] Respiratory Care Department [address]  [place] [address]  [phone]  Fax [phone]  (Fax order requests to this number)   Katelyn [name]  1951-01-15  [id number]   Please order the following    Shiley , size 6, and uncuffed   Supplies:     tube holder/collar. Quantity: maximum allowed  refills monthly , 4X4 gauze drain dressings. Quantity: 60 refills q month , Suction catheters. size:12 Quantity30  refillsq month , and tracheostomy care/cleaning kits Quantity 60  refillsevery month   Suction catheters (size: 8, 10,12,14)  Additional supplies    Medication instructions.      Maude Banner ACNP NPI# [id number] [facility]  [address] [place], [place] [address]   03/09/2024   @SIGNATUREIMG @    _______________________________________________.

## 2024-03-09 NOTE — Progress Notes (Signed)
" ° °  NAME:  Katelyn [name], MRN:  [id number], DOB:  September 13, 1950, LOS: 0 ADMISSION DATE:  03/09/2024, CONSULTATION DATE:  7/28 REFERRING MD:  Kara, CHIEF COMPLAINT:  trach management    History of Present Illness:  This is a 74 year old female w/ h/o laryngeal cancer and laryngectomy. Has had trach for airway management since recurrent resp failure episodes back in 2021. She has not used electrolarynx for some time. Has been at home w/ trach, seldom requires sxn, has had current trach in place since 2021 and has only done inner cannula changes.   Presents today for planned trach change   Review of Systems  All other systems reviewed and are negative.   Objective   Sats 90s   Examination: General: Presented to clinic via medical stretcher and ambulance transport.  She is in no acute distress HEENT normocephalic.  Chronic laryngectomy patient has a #6 cuffless Shiley laryngectomy site is unremarkable Pulmonary clear to auscultation Cardiac regular rate and rhythm Abdomen soft nontender Neuro awake, attempts to communicate. Extremities generalized weakness  Procedure The size 6 tracheostomy was removed.  Site inspected found to be unremarkable.  New tracheostomy placed over obturator patient tolerated well placement verified via end-tidal CO2 Assessment and Plan    Active Problems:   H/O laryngectomy  H/O laryngectomy Overview: Chronic laryngectomy; surgically completed 2019. Has had trach for airway maintenance since 2021.  The laryngectomy site is well matured.  She keeps a size 6 Shiley in place for airway management, however she almost never requires suction.  Of note she has never been referred to speech therapy, I tried to order an electrolarynx via home health but was unable to get this  Plan Cont current trach (6 cuffless shiley)-->changed today 1/14 I am not entirely sure where if home health speech therapy is an option, if so I am hoping they may have insight as to how to  order some sort of augmentative communicative device either in electrolarynx or something else to help her communicate ROV 3 mo.         This visit: review of most recent records, direct face to face time obtaining history, performing physical exam, developing and documenting plan as well as discussing this plan with the patient and/or care givers.  My billing time 16 level 2:  99212 (>10min)         "

## 2024-03-09 NOTE — Progress Notes (Signed)
 Tracheostomy Procedure Note  Katelyn [name] [id number] 1950/03/06  Pre Procedure Tracheostomy Information  Trach Brand: Shiley Size: #6 [id number] Style: Uncuffed Secured by: Velcro   Procedure: Trach change, cleaning and education.    Post Procedure Tracheostomy Information  Trach Brand: Shiley Size: #6 [id number] Style: Uncuffed Secured by: Velcro   Post Procedure Evaluation:  ETCO2 positive color change from yellow to purple : Yes.   Vital signs: stable throughout. Patients current condition: stable Complications: No apparent complications Trach site exam: clean, dry, no drainage Wound care done: dry, clean, and 4 x 4 gauze Patient did tolerate procedure well.   Education: Given and discussed with Jenna, CCM-NP  Prescription needs: Discussed with Jenna, CCM-NP    Additional needs: None

## 2024-03-10 ENCOUNTER — Other Ambulatory Visit: Payer: Self-pay | Admitting: *Deleted

## 2024-03-10 DIAGNOSIS — Z9002 Acquired absence of larynx: Secondary | ICD-10-CM

## 2024-03-10 NOTE — Telephone Encounter (Signed)
 Referral placed.  I contacted the patient's daughter, Kenney Dixons [facility]) and scheduled an OV as she is over due for her 3 month follow up (last seen 08/19/23).  She is not currently having any problems.  Laryngectomy was in 2019 per daughter.  I scheduled her OV for 05/24/24 at 11:15 am, advised for her to arrive by 11:00 am for check in.  She will arrive by [facility] (non emergency ambulance transportation).  She verbalized understanding.  Nothing further needed.

## 2024-05-24 ENCOUNTER — Ambulatory Visit: Admitting: Pulmonary Disease
